# Patient Record
Sex: Female | Born: 1935 | Race: Black or African American | Hispanic: No | Smoking: Former smoker
Health system: Southern US, Community
[De-identification: ages and names within clinical notes are randomized; demographics above are authoritative.]

## PROBLEM LIST (undated history)

## (undated) DIAGNOSIS — E079 Disorder of thyroid, unspecified: Secondary | ICD-10-CM

## (undated) DIAGNOSIS — I499 Cardiac arrhythmia, unspecified: Secondary | ICD-10-CM

## (undated) DIAGNOSIS — I10 Essential (primary) hypertension: Secondary | ICD-10-CM

## (undated) DIAGNOSIS — K08109 Complete loss of teeth, unspecified cause, unspecified class: Secondary | ICD-10-CM

## (undated) DIAGNOSIS — E785 Hyperlipidemia, unspecified: Secondary | ICD-10-CM

## (undated) DIAGNOSIS — T4145XA Adverse effect of unspecified anesthetic, initial encounter: Secondary | ICD-10-CM

## (undated) DIAGNOSIS — IMO0001 Reserved for inherently not codable concepts without codable children: Secondary | ICD-10-CM

## (undated) DIAGNOSIS — M810 Age-related osteoporosis without current pathological fracture: Secondary | ICD-10-CM

## (undated) DIAGNOSIS — D649 Anemia, unspecified: Secondary | ICD-10-CM

## (undated) DIAGNOSIS — E119 Type 2 diabetes mellitus without complications: Secondary | ICD-10-CM

## (undated) DIAGNOSIS — Z972 Presence of dental prosthetic device (complete) (partial): Secondary | ICD-10-CM

## (undated) DIAGNOSIS — Z87891 Personal history of nicotine dependence: Secondary | ICD-10-CM

## (undated) DIAGNOSIS — IMO0002 Reserved for concepts with insufficient information to code with codable children: Secondary | ICD-10-CM

## (undated) DIAGNOSIS — E042 Nontoxic multinodular goiter: Secondary | ICD-10-CM

## (undated) DIAGNOSIS — I4891 Unspecified atrial fibrillation: Secondary | ICD-10-CM

## (undated) DIAGNOSIS — C801 Malignant (primary) neoplasm, unspecified: Secondary | ICD-10-CM

## (undated) DIAGNOSIS — Z973 Presence of spectacles and contact lenses: Secondary | ICD-10-CM

## (undated) DIAGNOSIS — C50919 Malignant neoplasm of unspecified site of unspecified female breast: Secondary | ICD-10-CM

## (undated) DIAGNOSIS — T8859XA Other complications of anesthesia, initial encounter: Secondary | ICD-10-CM

## (undated) HISTORY — DX: Essential (primary) hypertension: I10

## (undated) HISTORY — PX: OTHER SURGICAL HISTORY: SHX169

## (undated) HISTORY — DX: Disorder of thyroid, unspecified: E07.9

## (undated) HISTORY — DX: Anemia, unspecified: D64.9

## (undated) HISTORY — DX: Type 2 diabetes mellitus without complications: E11.9

## (undated) HISTORY — DX: Reserved for concepts with insufficient information to code with codable children: IMO0002

## (undated) HISTORY — DX: Reserved for inherently not codable concepts without codable children: IMO0001

## (undated) HISTORY — DX: Age-related osteoporosis without current pathological fracture: M81.0

## (undated) HISTORY — DX: Nontoxic multinodular goiter: E04.2

## (undated) HISTORY — PX: ABDOMINAL HYSTERECTOMY: SHX81

## (undated) HISTORY — DX: Hyperlipidemia, unspecified: E78.5

## (undated) HISTORY — DX: Personal history of nicotine dependence: Z87.891

---

## 1998-09-11 ENCOUNTER — Inpatient Hospital Stay (HOSPITAL_COMMUNITY): Admission: RE | Admit: 1998-09-11 | Discharge: 1998-09-16 | Payer: Self-pay | Admitting: Orthopedic Surgery

## 2000-01-01 ENCOUNTER — Encounter: Payer: Self-pay | Admitting: Orthopedic Surgery

## 2000-01-05 ENCOUNTER — Inpatient Hospital Stay (HOSPITAL_COMMUNITY): Admission: RE | Admit: 2000-01-05 | Discharge: 2000-01-09 | Payer: Self-pay | Admitting: Orthopedic Surgery

## 2000-03-16 HISTORY — PX: TOTAL KNEE ARTHROPLASTY: SHX125

## 2006-12-02 ENCOUNTER — Ambulatory Visit (HOSPITAL_COMMUNITY): Admission: RE | Admit: 2006-12-02 | Discharge: 2006-12-02 | Payer: Self-pay | Admitting: Ophthalmology

## 2006-12-23 ENCOUNTER — Ambulatory Visit (HOSPITAL_COMMUNITY): Admission: RE | Admit: 2006-12-23 | Discharge: 2006-12-23 | Payer: Self-pay | Admitting: Ophthalmology

## 2009-03-12 ENCOUNTER — Emergency Department (HOSPITAL_COMMUNITY): Admission: EM | Admit: 2009-03-12 | Discharge: 2009-03-12 | Payer: Self-pay | Admitting: Emergency Medicine

## 2009-05-02 ENCOUNTER — Inpatient Hospital Stay (HOSPITAL_COMMUNITY): Admission: EM | Admit: 2009-05-02 | Discharge: 2009-05-13 | Payer: Self-pay | Admitting: Emergency Medicine

## 2009-12-22 HISTORY — PX: EYE SURGERY: SHX253

## 2010-06-04 LAB — URINE MICROSCOPIC-ADD ON

## 2010-06-04 LAB — URINALYSIS, MICROSCOPIC ONLY
Bilirubin Urine: NEGATIVE
Glucose, UA: NEGATIVE mg/dL
Ketones, ur: NEGATIVE mg/dL
Leukocytes, UA: NEGATIVE
Nitrite: NEGATIVE
Protein, ur: NEGATIVE mg/dL
Specific Gravity, Urine: 1.011 (ref 1.005–1.030)
Urobilinogen, UA: 1 mg/dL (ref 0.0–1.0)
pH: 5.5 (ref 5.0–8.0)

## 2010-06-04 LAB — CROSSMATCH
ABO/RH(D): O POS
Antibody Screen: NEGATIVE

## 2010-06-04 LAB — BASIC METABOLIC PANEL
BUN: 10 mg/dL (ref 6–23)
BUN: 10 mg/dL (ref 6–23)
BUN: 11 mg/dL (ref 6–23)
BUN: 12 mg/dL (ref 6–23)
BUN: 13 mg/dL (ref 6–23)
BUN: 13 mg/dL (ref 6–23)
BUN: 23 mg/dL (ref 6–23)
BUN: 8 mg/dL (ref 6–23)
CO2: 26 mEq/L (ref 19–32)
CO2: 26 mEq/L (ref 19–32)
CO2: 26 mEq/L (ref 19–32)
CO2: 28 mEq/L (ref 19–32)
CO2: 28 mEq/L (ref 19–32)
CO2: 28 mEq/L (ref 19–32)
CO2: 30 mEq/L (ref 19–32)
CO2: 31 mEq/L (ref 19–32)
Calcium: 8.8 mg/dL (ref 8.4–10.5)
Calcium: 9 mg/dL (ref 8.4–10.5)
Calcium: 9.1 mg/dL (ref 8.4–10.5)
Calcium: 9.2 mg/dL (ref 8.4–10.5)
Calcium: 9.2 mg/dL (ref 8.4–10.5)
Calcium: 9.3 mg/dL (ref 8.4–10.5)
Calcium: 9.5 mg/dL (ref 8.4–10.5)
Calcium: 9.5 mg/dL (ref 8.4–10.5)
Chloride: 100 mEq/L (ref 96–112)
Chloride: 102 mEq/L (ref 96–112)
Chloride: 102 mEq/L (ref 96–112)
Chloride: 103 mEq/L (ref 96–112)
Chloride: 103 mEq/L (ref 96–112)
Chloride: 104 mEq/L (ref 96–112)
Chloride: 104 mEq/L (ref 96–112)
Chloride: 107 mEq/L (ref 96–112)
Creatinine, Ser: 0.47 mg/dL (ref 0.4–1.2)
Creatinine, Ser: 0.51 mg/dL (ref 0.4–1.2)
Creatinine, Ser: 0.51 mg/dL (ref 0.4–1.2)
Creatinine, Ser: 0.53 mg/dL (ref 0.4–1.2)
Creatinine, Ser: 0.53 mg/dL (ref 0.4–1.2)
Creatinine, Ser: 0.54 mg/dL (ref 0.4–1.2)
Creatinine, Ser: 0.64 mg/dL (ref 0.4–1.2)
Creatinine, Ser: 0.75 mg/dL (ref 0.4–1.2)
GFR calc Af Amer: >60 mL/min (ref >60)
GFR calc Af Amer: >60 mL/min (ref >60)
GFR calc Af Amer: >60 mL/min (ref >60)
GFR calc Af Amer: >60 mL/min (ref >60)
GFR calc Af Amer: >60 mL/min (ref >60)
GFR calc Af Amer: >60 mL/min (ref >60)
GFR calc Af Amer: >60 mL/min (ref >60)
GFR calc Af Amer: >60 mL/min (ref >60)
GFR calc non Af Amer: >60 mL/min (ref >60)
GFR calc non Af Amer: >60 mL/min (ref >60)
GFR calc non Af Amer: >60 mL/min (ref >60)
GFR calc non Af Amer: >60 mL/min (ref >60)
GFR calc non Af Amer: >60 mL/min (ref >60)
GFR calc non Af Amer: >60 mL/min (ref >60)
GFR calc non Af Amer: >60 mL/min (ref >60)
GFR calc non Af Amer: >60 mL/min (ref >60)
Glucose, Bld: 151 mg/dL — ABNORMAL HIGH (ref 70–99)
Glucose, Bld: 167 mg/dL — ABNORMAL HIGH (ref 70–99)
Glucose, Bld: 179 mg/dL — ABNORMAL HIGH (ref 70–99)
Glucose, Bld: 179 mg/dL — ABNORMAL HIGH (ref 70–99)
Glucose, Bld: 181 mg/dL — ABNORMAL HIGH (ref 70–99)
Glucose, Bld: 183 mg/dL — ABNORMAL HIGH (ref 70–99)
Glucose, Bld: 193 mg/dL — ABNORMAL HIGH (ref 70–99)
Glucose, Bld: 210 mg/dL — ABNORMAL HIGH (ref 70–99)
Potassium: 2.9 mEq/L — ABNORMAL LOW (ref 3.5–5.1)
Potassium: 3.1 mEq/L — ABNORMAL LOW (ref 3.5–5.1)
Potassium: 3.1 mEq/L — ABNORMAL LOW (ref 3.5–5.1)
Potassium: 3.3 mEq/L — ABNORMAL LOW (ref 3.5–5.1)
Potassium: 3.4 mEq/L — ABNORMAL LOW (ref 3.5–5.1)
Potassium: 3.4 mEq/L — ABNORMAL LOW (ref 3.5–5.1)
Potassium: 3.7 mEq/L (ref 3.5–5.1)
Potassium: 3.8 mEq/L (ref 3.5–5.1)
Sodium: 135 mEq/L (ref 135–145)
Sodium: 136 mEq/L (ref 135–145)
Sodium: 136 mEq/L (ref 135–145)
Sodium: 136 mEq/L (ref 135–145)
Sodium: 137 mEq/L (ref 135–145)
Sodium: 138 mEq/L (ref 135–145)
Sodium: 140 mEq/L (ref 135–145)
Sodium: 141 mEq/L (ref 135–145)

## 2010-06-04 LAB — GLUCOSE, CAPILLARY
Glucose-Capillary: 133 mg/dL — ABNORMAL HIGH (ref 70–99)
Glucose-Capillary: 134 mg/dL — ABNORMAL HIGH (ref 70–99)
Glucose-Capillary: 137 mg/dL — ABNORMAL HIGH (ref 70–99)
Glucose-Capillary: 138 mg/dL — ABNORMAL HIGH (ref 70–99)
Glucose-Capillary: 138 mg/dL — ABNORMAL HIGH (ref 70–99)
Glucose-Capillary: 139 mg/dL — ABNORMAL HIGH (ref 70–99)
Glucose-Capillary: 144 mg/dL — ABNORMAL HIGH (ref 70–99)
Glucose-Capillary: 145 mg/dL — ABNORMAL HIGH (ref 70–99)
Glucose-Capillary: 150 mg/dL — ABNORMAL HIGH (ref 70–99)
Glucose-Capillary: 150 mg/dL — ABNORMAL HIGH (ref 70–99)
Glucose-Capillary: 151 mg/dL — ABNORMAL HIGH (ref 70–99)
Glucose-Capillary: 152 mg/dL — ABNORMAL HIGH (ref 70–99)
Glucose-Capillary: 153 mg/dL — ABNORMAL HIGH (ref 70–99)
Glucose-Capillary: 153 mg/dL — ABNORMAL HIGH (ref 70–99)
Glucose-Capillary: 155 mg/dL — ABNORMAL HIGH (ref 70–99)
Glucose-Capillary: 155 mg/dL — ABNORMAL HIGH (ref 70–99)
Glucose-Capillary: 158 mg/dL — ABNORMAL HIGH (ref 70–99)
Glucose-Capillary: 158 mg/dL — ABNORMAL HIGH (ref 70–99)
Glucose-Capillary: 161 mg/dL — ABNORMAL HIGH (ref 70–99)
Glucose-Capillary: 161 mg/dL — ABNORMAL HIGH (ref 70–99)
Glucose-Capillary: 162 mg/dL — ABNORMAL HIGH (ref 70–99)
Glucose-Capillary: 164 mg/dL — ABNORMAL HIGH (ref 70–99)
Glucose-Capillary: 164 mg/dL — ABNORMAL HIGH (ref 70–99)
Glucose-Capillary: 164 mg/dL — ABNORMAL HIGH (ref 70–99)
Glucose-Capillary: 164 mg/dL — ABNORMAL HIGH (ref 70–99)
Glucose-Capillary: 166 mg/dL — ABNORMAL HIGH (ref 70–99)
Glucose-Capillary: 168 mg/dL — ABNORMAL HIGH (ref 70–99)
Glucose-Capillary: 173 mg/dL — ABNORMAL HIGH (ref 70–99)
Glucose-Capillary: 174 mg/dL — ABNORMAL HIGH (ref 70–99)
Glucose-Capillary: 178 mg/dL — ABNORMAL HIGH (ref 70–99)
Glucose-Capillary: 182 mg/dL — ABNORMAL HIGH (ref 70–99)
Glucose-Capillary: 183 mg/dL — ABNORMAL HIGH (ref 70–99)
Glucose-Capillary: 184 mg/dL — ABNORMAL HIGH (ref 70–99)
Glucose-Capillary: 184 mg/dL — ABNORMAL HIGH (ref 70–99)
Glucose-Capillary: 187 mg/dL — ABNORMAL HIGH (ref 70–99)
Glucose-Capillary: 188 mg/dL — ABNORMAL HIGH (ref 70–99)
Glucose-Capillary: 189 mg/dL — ABNORMAL HIGH (ref 70–99)
Glucose-Capillary: 194 mg/dL — ABNORMAL HIGH (ref 70–99)
Glucose-Capillary: 206 mg/dL — ABNORMAL HIGH (ref 70–99)
Glucose-Capillary: 207 mg/dL — ABNORMAL HIGH (ref 70–99)
Glucose-Capillary: 221 mg/dL — ABNORMAL HIGH (ref 70–99)
Glucose-Capillary: 243 mg/dL — ABNORMAL HIGH (ref 70–99)

## 2010-06-04 LAB — URINALYSIS, ROUTINE W REFLEX MICROSCOPIC
Bilirubin Urine: NEGATIVE
Bilirubin Urine: NEGATIVE
Bilirubin Urine: NEGATIVE
Glucose, UA: NEGATIVE mg/dL
Glucose, UA: NEGATIVE mg/dL
Glucose, UA: NEGATIVE mg/dL
Ketones, ur: NEGATIVE mg/dL
Ketones, ur: NEGATIVE mg/dL
Ketones, ur: NEGATIVE mg/dL
Leukocytes, UA: NEGATIVE
Leukocytes, UA: NEGATIVE
Nitrite: NEGATIVE
Nitrite: NEGATIVE
Nitrite: NEGATIVE
Protein, ur: 100 mg/dL — AB
Protein, ur: 30 mg/dL — AB
Protein, ur: NEGATIVE mg/dL
Specific Gravity, Urine: 1.008 (ref 1.005–1.030)
Specific Gravity, Urine: 1.022 (ref 1.005–1.030)
Specific Gravity, Urine: 1.023 (ref 1.005–1.030)
Urobilinogen, UA: 0.2 mg/dL (ref 0.0–1.0)
Urobilinogen, UA: 1 mg/dL (ref 0.0–1.0)
Urobilinogen, UA: 1 mg/dL (ref 0.0–1.0)
pH: 6 (ref 5.0–8.0)
pH: 6 (ref 5.0–8.0)
pH: 6.5 (ref 5.0–8.0)

## 2010-06-04 LAB — CBC
HCT: 21.9 % — ABNORMAL LOW (ref 36.0–46.0)
HCT: 22 % — ABNORMAL LOW (ref 36.0–46.0)
HCT: 23.7 % — ABNORMAL LOW (ref 36.0–46.0)
HCT: 28 % — ABNORMAL LOW (ref 36.0–46.0)
HCT: 28.5 % — ABNORMAL LOW (ref 36.0–46.0)
HCT: 32.3 % — ABNORMAL LOW (ref 36.0–46.0)
HCT: 32.7 % — ABNORMAL LOW (ref 36.0–46.0)
HCT: 32.9 % — ABNORMAL LOW (ref 36.0–46.0)
HCT: 33.2 % — ABNORMAL LOW (ref 36.0–46.0)
HCT: 34.7 % — ABNORMAL LOW (ref 36.0–46.0)
Hemoglobin: 11 g/dL — ABNORMAL LOW (ref 12.0–15.0)
Hemoglobin: 11 g/dL — ABNORMAL LOW (ref 12.0–15.0)
Hemoglobin: 11.1 g/dL — ABNORMAL LOW (ref 12.0–15.0)
Hemoglobin: 11.2 g/dL — ABNORMAL LOW (ref 12.0–15.0)
Hemoglobin: 11.8 g/dL — ABNORMAL LOW (ref 12.0–15.0)
Hemoglobin: 7.4 g/dL — ABNORMAL LOW (ref 12.0–15.0)
Hemoglobin: 7.5 g/dL — ABNORMAL LOW (ref 12.0–15.0)
Hemoglobin: 8 g/dL — ABNORMAL LOW (ref 12.0–15.0)
Hemoglobin: 9.6 g/dL — ABNORMAL LOW (ref 12.0–15.0)
Hemoglobin: 9.8 g/dL — ABNORMAL LOW (ref 12.0–15.0)
MCHC: 33.6 g/dL (ref 30.0–36.0)
MCHC: 33.7 g/dL (ref 30.0–36.0)
MCHC: 33.8 g/dL (ref 30.0–36.0)
MCHC: 33.8 g/dL (ref 30.0–36.0)
MCHC: 33.9 g/dL (ref 30.0–36.0)
MCHC: 34 g/dL (ref 30.0–36.0)
MCHC: 34.1 g/dL (ref 30.0–36.0)
MCHC: 34.1 g/dL (ref 30.0–36.0)
MCHC: 34.3 g/dL (ref 30.0–36.0)
MCHC: 34.5 g/dL (ref 30.0–36.0)
MCV: 86 fL (ref 78.0–100.0)
MCV: 86.1 fL (ref 78.0–100.0)
MCV: 86.4 fL (ref 78.0–100.0)
MCV: 86.8 fL (ref 78.0–100.0)
MCV: 87.3 fL (ref 78.0–100.0)
MCV: 88 fL (ref 78.0–100.0)
MCV: 88.5 fL (ref 78.0–100.0)
MCV: 88.9 fL (ref 78.0–100.0)
MCV: 89.2 fL (ref 78.0–100.0)
MCV: 89.3 fL (ref 78.0–100.0)
Platelets: 140 10*3/uL — ABNORMAL LOW (ref 150–400)
Platelets: 144 10*3/uL — ABNORMAL LOW (ref 150–400)
Platelets: 147 10*3/uL — ABNORMAL LOW (ref 150–400)
Platelets: 153 10*3/uL (ref 150–400)
Platelets: 170 10*3/uL (ref 150–400)
Platelets: 175 10*3/uL (ref 150–400)
Platelets: 197 10*3/uL (ref 150–400)
Platelets: 219 10*3/uL (ref 150–400)
Platelets: 224 10*3/uL (ref 150–400)
Platelets: 316 10*3/uL (ref 150–400)
RBC: 2.54 MIL/uL — ABNORMAL LOW (ref 3.87–5.11)
RBC: 2.56 MIL/uL — ABNORMAL LOW (ref 3.87–5.11)
RBC: 2.75 MIL/uL — ABNORMAL LOW (ref 3.87–5.11)
RBC: 3.21 MIL/uL — ABNORMAL LOW (ref 3.87–5.11)
RBC: 3.24 MIL/uL — ABNORMAL LOW (ref 3.87–5.11)
RBC: 3.64 MIL/uL — ABNORMAL LOW (ref 3.87–5.11)
RBC: 3.68 MIL/uL — ABNORMAL LOW (ref 3.87–5.11)
RBC: 3.69 MIL/uL — ABNORMAL LOW (ref 3.87–5.11)
RBC: 3.82 MIL/uL — ABNORMAL LOW (ref 3.87–5.11)
RBC: 3.89 MIL/uL (ref 3.87–5.11)
RDW: 15.3 % (ref 11.5–15.5)
RDW: 15.8 % — ABNORMAL HIGH (ref 11.5–15.5)
RDW: 15.9 % — ABNORMAL HIGH (ref 11.5–15.5)
RDW: 16 % — ABNORMAL HIGH (ref 11.5–15.5)
RDW: 16.1 % — ABNORMAL HIGH (ref 11.5–15.5)
RDW: 16.2 % — ABNORMAL HIGH (ref 11.5–15.5)
RDW: 16.3 % — ABNORMAL HIGH (ref 11.5–15.5)
RDW: 16.3 % — ABNORMAL HIGH (ref 11.5–15.5)
RDW: 16.3 % — ABNORMAL HIGH (ref 11.5–15.5)
RDW: 16.4 % — ABNORMAL HIGH (ref 11.5–15.5)
WBC: 12.3 10*3/uL — ABNORMAL HIGH (ref 4.0–10.5)
WBC: 5.8 10*3/uL (ref 4.0–10.5)
WBC: 5.9 10*3/uL (ref 4.0–10.5)
WBC: 6 10*3/uL (ref 4.0–10.5)
WBC: 6.2 10*3/uL (ref 4.0–10.5)
WBC: 6.6 10*3/uL (ref 4.0–10.5)
WBC: 7.4 10*3/uL (ref 4.0–10.5)
WBC: 8.1 10*3/uL (ref 4.0–10.5)
WBC: 8.3 10*3/uL (ref 4.0–10.5)
WBC: 9.5 10*3/uL (ref 4.0–10.5)

## 2010-06-04 LAB — PROTIME-INR
INR: 0.98 (ref 0.00–1.49)
INR: 1.11 (ref 0.00–1.49)
INR: 1.14 (ref 0.00–1.49)
INR: 1.35 (ref 0.00–1.49)
INR: 2.03 — ABNORMAL HIGH (ref 0.00–1.49)
INR: 2.22 — ABNORMAL HIGH (ref 0.00–1.49)
INR: 2.23 — ABNORMAL HIGH (ref 0.00–1.49)
INR: 2.24 — ABNORMAL HIGH (ref 0.00–1.49)
Prothrombin Time: 12.9 seconds (ref 11.6–15.2)
Prothrombin Time: 14.2 seconds (ref 11.6–15.2)
Prothrombin Time: 14.5 seconds (ref 11.6–15.2)
Prothrombin Time: 16.6 seconds — ABNORMAL HIGH (ref 11.6–15.2)
Prothrombin Time: 22.8 seconds — ABNORMAL HIGH (ref 11.6–15.2)
Prothrombin Time: 24.4 seconds — ABNORMAL HIGH (ref 11.6–15.2)
Prothrombin Time: 24.5 seconds — ABNORMAL HIGH (ref 11.6–15.2)
Prothrombin Time: 24.6 seconds — ABNORMAL HIGH (ref 11.6–15.2)

## 2010-06-04 LAB — COMPREHENSIVE METABOLIC PANEL
ALT: 21 U/L (ref 0–35)
AST: 25 U/L (ref 0–37)
Albumin: 3.4 g/dL — ABNORMAL LOW (ref 3.5–5.2)
Alkaline Phosphatase: 67 U/L (ref 39–117)
BUN: 25 mg/dL — ABNORMAL HIGH (ref 6–23)
CO2: 24 mEq/L (ref 19–32)
Calcium: 9.8 mg/dL (ref 8.4–10.5)
Chloride: 105 mEq/L (ref 96–112)
Creatinine, Ser: 0.75 mg/dL (ref 0.4–1.2)
GFR calc Af Amer: >60 mL/min (ref >60)
GFR calc non Af Amer: >60 mL/min (ref >60)
Glucose, Bld: 146 mg/dL — ABNORMAL HIGH (ref 70–99)
Potassium: 3.8 mEq/L (ref 3.5–5.1)
Sodium: 140 mEq/L (ref 135–145)
Total Bilirubin: 0.5 mg/dL (ref 0.3–1.2)
Total Protein: 6.2 g/dL (ref 6.0–8.3)

## 2010-06-04 LAB — MRSA PCR SCREENING: MRSA by PCR: NEGATIVE

## 2010-06-04 LAB — POCT I-STAT, CHEM 8
BUN: 25 mg/dL — ABNORMAL HIGH (ref 6–23)
Calcium, Ion: 1.22 mmol/L (ref 1.12–1.32)
Chloride: 108 mEq/L (ref 96–112)
Creatinine, Ser: 0.7 mg/dL (ref 0.4–1.2)
Glucose, Bld: 148 mg/dL — ABNORMAL HIGH (ref 70–99)
HCT: 33 % — ABNORMAL LOW (ref 36.0–46.0)
Hemoglobin: 11.2 g/dL — ABNORMAL LOW (ref 12.0–15.0)
Potassium: 3.8 mEq/L (ref 3.5–5.1)
Sodium: 138 mEq/L (ref 135–145)
TCO2: 26 mmol/L (ref 0–100)

## 2010-06-04 LAB — HEMOGLOBIN AND HEMATOCRIT, BLOOD
HCT: 25.1 % — ABNORMAL LOW (ref 36.0–46.0)
Hemoglobin: 8.4 g/dL — ABNORMAL LOW (ref 12.0–15.0)

## 2010-06-04 LAB — URINE CULTURE
Colony Count: NO GROWTH
Culture: NO GROWTH

## 2010-06-04 LAB — APTT
aPTT: 26 seconds (ref 24–37)
aPTT: 31 seconds (ref 24–37)

## 2010-06-04 LAB — ABO/RH: ABO/RH(D): O POS

## 2010-06-04 LAB — LACTIC ACID, PLASMA: Lactic Acid, Venous: 3.3 mmol/L — ABNORMAL HIGH (ref 0.5–2.2)

## 2010-08-01 NOTE — Discharge Summary (Signed)
[place]  Patient:    Whitney [name], Whitney [name]                    MRN: [id number] Adm. Date:  [id number] Disc. Date: 01/09/00 Attending:  Carolan Shiver Ii Dictator:   Jamelle Rushing, P.A. CC:         Alfonse Alpers. Dagoberto Ligas, M.D.   Discharge Summary  DATE OF BIRTH:  10/24/1927  ADMISSION DIAGNOSES: 1. Failed left total knee arthroplasty. 2. Hypertension. 3. Diabetes type 2. 4. History of thyroid disease. 5. Hypercholesterolemia.  DISCHARGE DIAGNOSES: 1. Revision left total knee arthroplasty. 2. Hypertension. 3. Diabetes mellitus type 2. 4. History of thyroid disease. 5. Hypercholesterolemia.  SURGICAL PROCEDURES:  On January 05, 2000, the patient was taken to the OR by Jonny Ruiz L. Dorothyann Gibbs, M.D., assisted by Georgena Spurling, M.D., and Jamelle Rushing, P.A.  Under general anesthesia, the patient underwent a revision of the tibial component of the left total knee.  The patient had a new tibial tray and poly inserted into the knee.  The patient tolerated the procedure well. The approximate tourniquet time was 1 hours and 20 minutes.  The patient was transferred to the recovery room in good condition.  CONSULTS:  On January 05, 2000, the following routine consults were requested: Physical therapy, occupational therapy, rehabilitation, care management, and pharmacy for Coumadin dosing for DVT prophylaxis.  HOSPITAL COURSE:  On January 05, 2000, the patient was admitted to [facility]. [facility] under the care of John L. Dorothyann Gibbs, M.D.  The patient was taken to the OR for a revision of her left total knee arthroplasty and this was accomplished without any complications.  The patient then had a four-day postoperative course where she did have a period of postoperative blood loss anemia where on postoperative day #2 her hemoglobin was 8.9 and her hematocrit was 25.8.  She received two units of packed red blood cells with an improvement of  her hemoglobin to 9.1 and her hematocrit to 26.2.  The patient did have some calf pain on postoperative day #2, so a Doppler was obtained to rule out a DVT and this was in fact negative.  Otherwise the patient progressed very nicely with physical therapy and was able to be discharged on postoperative day #4 to home.  The patient did develop an erythematic rash about the left knee around the taping pattern of her knee with some soft skin blisters also developing.  They did improve slightly after discontinuing the taping and just fastening the bandage to the skin underneath the TED stockings.  The patient also had some cortisone 1% cream applied to effected areas to see if this would also improve some of the itching which she developed.  The patient was eager to go home on postoperative day #4 and there were no indications for keeping her in the hospital at this time, so she was in fact discharged to home.  LABORATORIES:  The CBC on January 08, 2000, showed WBC 8.4, hemoglobin 9.1, hematocrit 26.2, and platelets 211.  Coagulation times on January 09, 2000, were PT 17.1 with a 1.7 INR.  Routine chemistries on January 07, 2000, were sodium 133, glucose 179, and all other values normal.  MEDICATIONS ON DISCHARGE:  1. Colace 100 mg p.o. b.i.d.  2. Senokot one tablet p.o. b.i.d. before meals.  3. Corgard 30 mg p.o. q.d.  4. Glyburide 5 mg p.o. b.i.d. before meals.  5. Lisinopril 20 mg  p.o. q.d.  6. Triamterene/hydrochlorothiazide 25 mg p.o. q.d.  7. Zocor 20 mg p.o. q.d.  8. Norvasc 5 mg p.o. q.d.  9. Glucophage 1000 mg p.o. b.i.d. 10. OxyContin CR 10 mg p.o. q.12h. 11. Benadryl 25 mg p.o. q.6h. p.r.n. 12. Percocet one or two tablets p.o. q.4-6h. p.r.n. pain. 13. Coumadin 5 mg p.o. q.d. to be adjusted and maintain an INR of 2-2.5 by the     [place]. [facility] pharmacist.  CONDITION ON DISCHARGE TO HOME:  Listed as good. DD:  01/09/00 TD:  01/09/00 Job:  [id number]

## 2010-08-01 NOTE — Op Note (Signed)
[place]. [facility]  Patient:    Whitney [name], Whitney [name]                    MRN: [id number] Proc. Date: 01/05/00 Adm. Date:  [id number] Attending:  Carolan Shiver Ii                           Operative Report  PREOPERATIVE DIAGNOSIS:  Loose tibial component, left total knee.  POSTOPERATIVE DIAGNOSIS:  Loose tibial component, left total knee.  OPERATION PERFORMED:  Revision tibial component, left total knee.  SURGEON:  John L. Rendall III, M.D.  ASSISTANT: 1. Georgena Spurling, M.D. 2. Jamelle Rushing, P.A.  ANESTHESIA:  General.  PATHOLOGY:  The patient has had a total knee done a little over a year ago. She has had severe valgus prior to surgery and has developed a significant medial joint pain in her left total knee.  At the time of surgery, the tibial tray was found to be sunken in medially and the tray was grossly loose.  DESCRIPTION OF PROCEDURE:  Under general anesthesia, the left leg was prepared with Dura-Prep and draped as a sterile field.  A sterile thigh tourniquet was used.  The leg was wrapped out with the Esmarch and the tourniquet was used 350 mm.  The previous midline incision was reopened.  A deep medial parapatellar incision was made.  The patella was everted.  Thick brown synovitis was seen.  A synovectomy was carried out.  The periosteum and medial collateral ligament were peeled from the medial side of the tibia.  The tibial plastic tray was removed.  The tibial component was grossly loose and was removed.  A center hole was drilled.  Proximal tibial resection was carried out using an intramedullary guide.  Once this was done, care was taken to remove any excess glue from the center peg hole.  This was then sized to a standard.  A progressive hand reaming up to 18 mm revealed good fit for a 95 mm revision fluted stem and then this was tried in place.  Standard size revision tibial tray with 95 mm 18.5 fluted tibial stem.  Excellent  fit, alignment and stability were found.  An approximate 15 mm bearing was originally used but minor trimming of the tibial tray upped this to 17.5.  A permanent component was then cemented in place after preparing the proximal tibia with multiple drill holes and irrigating with pulse saline lavage and antibiotic solution.  The Depuy 2 bone cement was used.  The tibial tray component was cemented in place.  A temporary spacer was used.  Once cement hardened, all excess was removed.  A trialing of 17.5 and 20 mm bearings revealed the 20 mm rotating platform bearing was the best fit.  The tourniquet was then let down and the knee was closed in layers with #1 Tycron, 0 and 2-0 Vicryl and skin clips.  Tourniquet time was one hour.  Total operative time was approximately one hour and 20 minutes.  The patient tolerated the procedure well and returned to recovery in good condition.  There was excellent stability and alignment status post revision. DD:  01/05/00 TD:  01/05/00 Job: [id number]

## 2010-12-26 LAB — BASIC METABOLIC PANEL
BUN: 22
CO2: 32
Calcium: 10.4
Chloride: 104
Creatinine, Ser: 0.9
GFR calc Af Amer: >60
GFR calc non Af Amer: >60
Glucose, Bld: 91
Potassium: 3.8
Sodium: 143

## 2010-12-26 LAB — HEMOGLOBIN AND HEMATOCRIT, BLOOD
HCT: 34 — ABNORMAL LOW
Hemoglobin: 11.4 — ABNORMAL LOW

## 2012-10-06 ENCOUNTER — Other Ambulatory Visit: Payer: Self-pay | Admitting: *Deleted

## 2012-10-06 MED ORDER — POTASSIUM CHLORIDE CRYS ER 20 MEQ PO TBCR: 20.0000 meq | EXTENDED_RELEASE_TABLET | Freq: Every day | ORAL | Status: DC

## 2012-11-17 ENCOUNTER — Telehealth: Payer: Self-pay | Admitting: Endocrinology

## 2012-11-17 MED ORDER — PIOGLITAZONE HCL 30 MG PO TABS: 30.0000 mg | ORAL_TABLET | Freq: Every day | ORAL | Status: DC

## 2012-11-17 NOTE — Telephone Encounter (Signed)
rx sent

## 2012-11-21 ENCOUNTER — Other Ambulatory Visit: Payer: Self-pay | Admitting: *Deleted

## 2012-11-21 DIAGNOSIS — E039 Hypothyroidism, unspecified: Secondary | ICD-10-CM

## 2012-11-21 DIAGNOSIS — E119 Type 2 diabetes mellitus without complications: Secondary | ICD-10-CM

## 2012-11-21 DIAGNOSIS — E876 Hypokalemia: Secondary | ICD-10-CM

## 2012-11-24 ENCOUNTER — Other Ambulatory Visit: Payer: Self-pay | Admitting: Endocrinology

## 2012-11-24 ENCOUNTER — Other Ambulatory Visit (INDEPENDENT_AMBULATORY_CARE_PROVIDER_SITE_OTHER): Payer: Medicare Other

## 2012-11-24 ENCOUNTER — Ambulatory Visit: Payer: Medicare Other | Admitting: Endocrinology

## 2012-11-24 DIAGNOSIS — E039 Hypothyroidism, unspecified: Secondary | ICD-10-CM

## 2012-11-24 DIAGNOSIS — E119 Type 2 diabetes mellitus without complications: Secondary | ICD-10-CM

## 2012-11-24 LAB — COMPREHENSIVE METABOLIC PANEL
ALT: 11 U/L (ref 0–35)
AST: 13 U/L (ref 0–37)
Albumin: 3.8 g/dL (ref 3.5–5.2)
Alkaline Phosphatase: 43 U/L (ref 39–117)
BUN: 19 mg/dL (ref 6–23)
CO2: 31 mEq/L (ref 19–32)
Calcium: 10 mg/dL (ref 8.4–10.5)
Chloride: 103 mEq/L (ref 96–112)
Creatinine, Ser: 0.7 mg/dL (ref 0.4–1.2)
GFR: 102.83 mL/min (ref >60.00)
Glucose, Bld: 121 mg/dL — ABNORMAL HIGH (ref 70–99)
Potassium: 4.2 mEq/L (ref 3.5–5.1)
Sodium: 139 mEq/L (ref 135–145)
Total Bilirubin: 0.5 mg/dL (ref 0.3–1.2)
Total Protein: 6.6 g/dL (ref 6.0–8.3)

## 2012-11-24 LAB — MICROALBUMIN / CREATININE URINE RATIO
Creatinine,U: 73.3 mg/dL
Microalb Creat Ratio: 18.4 mg/g (ref 0.0–30.0)
Microalb, Ur: 13.5 mg/dL — ABNORMAL HIGH (ref 0.0–1.9)

## 2012-11-24 LAB — URINALYSIS, ROUTINE W REFLEX MICROSCOPIC
Bilirubin Urine: NEGATIVE
Hgb urine dipstick: NEGATIVE
Ketones, ur: NEGATIVE
Nitrite: NEGATIVE
RBC / HPF: NONE SEEN (ref 0–?)
Specific Gravity, Urine: 1.015 (ref 1.000–1.030)
Urine Glucose: NEGATIVE
Urobilinogen, UA: 0.2 (ref 0.0–1.0)
pH: 6.5 (ref 5.0–8.0)

## 2012-11-24 LAB — HEMOGLOBIN A1C: Hgb A1c MFr Bld: 6 % (ref 4.6–6.5)

## 2012-11-24 LAB — TSH: TSH: 0.67 u[IU]/mL (ref 0.35–5.50)

## 2012-11-24 LAB — T4, FREE: Free T4: 1.37 ng/dL (ref 0.60–1.60)

## 2012-12-01 ENCOUNTER — Ambulatory Visit (INDEPENDENT_AMBULATORY_CARE_PROVIDER_SITE_OTHER): Payer: Medicare Other | Admitting: Endocrinology

## 2012-12-01 ENCOUNTER — Other Ambulatory Visit: Payer: Self-pay | Admitting: *Deleted

## 2012-12-01 ENCOUNTER — Encounter: Payer: Self-pay | Admitting: Endocrinology

## 2012-12-01 VITALS — BP 120/72 | HR 75 | Temp 98.4°F | Resp 12 | Ht 67.0 in | Wt 201.4 lb

## 2012-12-01 DIAGNOSIS — R609 Edema, unspecified: Secondary | ICD-10-CM | POA: Insufficient documentation

## 2012-12-01 DIAGNOSIS — I10 Essential (primary) hypertension: Secondary | ICD-10-CM | POA: Insufficient documentation

## 2012-12-01 DIAGNOSIS — E559 Vitamin D deficiency, unspecified: Secondary | ICD-10-CM | POA: Insufficient documentation

## 2012-12-01 DIAGNOSIS — E042 Nontoxic multinodular goiter: Secondary | ICD-10-CM

## 2012-12-01 DIAGNOSIS — E7800 Pure hypercholesterolemia, unspecified: Secondary | ICD-10-CM

## 2012-12-01 DIAGNOSIS — E119 Type 2 diabetes mellitus without complications: Secondary | ICD-10-CM

## 2012-12-01 NOTE — Patient Instructions (Addendum)
Please check blood sugars at least half the time about 2 hours after any meal and as directed on waking up. Please bring blood sugar monitor to each visit  Do not take Lasix unless feet swelling Take Potassium only when lasix taken

## 2012-12-01 NOTE — Progress Notes (Signed)
Patient ID: Whitney [name], female   DOB: 03-13-1928, 79 y.o.   MRN: [id number]  Whitney [name] is an 79 y.o. female.   Reason for Appointment: Diabetes follow-up   History of Present Illness   Diagnosis: Type 2 DIABETES MELITUS, date of diagnosis:  1983     Previous history: She was previously taking metformin and Actos for several years. She generally has had excellent control  Recent history: Her metformin was reduced earlier this year because of her age but not clear if she is taking it now. However had gained weight when her metformin was reduced and still has not been able to lose any         Monitors blood glucose: Once a day.    Glucometer: One Touch.          Blood Glucose readings from meter download: readings before breakfast:  98-176 with only occasional high readings, overall median 135 Hypoglycemia frequency:  none        Meals: 3 meals per day.          Physical activity: exercise: Just walking around the house           Dietician visit: Most recent:?         Complications: are: None The last HbgA1c was reported as 6.0    Wt Readings from Last 3 Encounters:  12/01/12 201 lb 6.4 oz (91.354 kg)    LABS:  Lab Results  Component Value Date   HGBA1C 6.0 11/24/2012   Orders Only on 11/24/2012  Component Date Value Range Status  . TSH 11/24/2012 0.67  0.35 - 5.50 uIU/mL Final  Orders Only on 11/24/2012  Component Date Value Range Status  . Free T4 11/24/2012 1.37  0.60 - 1.60 ng/dL Final  Orders Only on 40/98/1191  Component Date Value Range Status  . Sodium 11/24/2012 139  135 - 145 mEq/L Final  . Potassium 11/24/2012 4.2  3.5 - 5.1 mEq/L Final  . Chloride 11/24/2012 103  96 - 112 mEq/L Final  . CO2 11/24/2012 31  19 - 32 mEq/L Final  . Glucose, Bld 11/24/2012 121* 70 - 99 mg/dL Final  . BUN 47/82/9562 19  6 - 23 mg/dL Final  . Creatinine, Ser 11/24/2012 0.7  0.4 - 1.2 mg/dL Final  . Total Bilirubin 11/24/2012 0.5  0.3 - 1.2 mg/dL Final  . Alkaline  Phosphatase 11/24/2012 43  39 - 117 U/L Final  . AST 11/24/2012 13  0 - 37 U/L Final  . ALT 11/24/2012 11  0 - 35 U/L Final  . Total Protein 11/24/2012 6.6  6.0 - 8.3 g/dL Final  . Albumin 13/10/6576 3.8  3.5 - 5.2 g/dL Final  . Calcium 46/96/2952 10.0  8.4 - 10.5 mg/dL Final  . GFR 84/13/2440 102.83  >60.00 mL/min Final  Orders Only on 11/24/2012  Component Date Value Range Status  . Hemoglobin A1C 11/24/2012 6.0  4.6 - 6.5 % Final   Glycemic Control Guidelines for People with Diabetes:Non Diabetic:  <6%Goal of Therapy: <7%Additional Action Suggested:  >8%   Appointment on 11/24/2012  Component Date Value Range Status  . Microalb, Ur 11/24/2012 13.5* 0.0 - 1.9 mg/dL Final  . Creatinine,U 01/10/2535 73.3   Final  . Microalb Creat Ratio 11/24/2012 18.4  0.0 - 30.0 mg/g Final  . Color, Urine 11/24/2012 LT. YELLOW  Yellow;Lt. Yellow Final  . APPearance 11/24/2012 CLEAR  Clear Final  . Specific Gravity, Urine 11/24/2012 1.015  1.000-1.030 Final  .  pH 11/24/2012 6.5  5.0 - 8.0 Final  . Total Protein, Urine 11/24/2012 TRACE  Negative Final  . Urine Glucose 11/24/2012 NEGATIVE  Negative Final  . Ketones, ur 11/24/2012 NEGATIVE  Negative Final  . Bilirubin Urine 11/24/2012 NEGATIVE  Negative Final  . Hgb urine dipstick 11/24/2012 NEGATIVE  Negative Final  . Urobilinogen, UA 11/24/2012 0.2  0.0 - 1.0 Final  . Leukocytes, UA 11/24/2012 SMALL  Negative Final  . Nitrite 11/24/2012 NEGATIVE  Negative Final  . WBC, UA 11/24/2012 7-10/hpf  0-2/hpf Final  . RBC / HPF 11/24/2012 none seen  0-2/hpf Final  . Squamous Epithelial / LPF 11/24/2012 Rare(0-4/hpf)  Rare(0-4/hpf) Final       Medication List       This list is accurate as of: 12/01/12 11:59 PM.  Always use your most recent med list.               AMLODIPINE BESYLATE PO  Take 5 mg by mouth.     aspirin 81 MG tablet  Take 81 mg by mouth daily.     atorvastatin 10 MG tablet  Commonly known as:  LIPITOR  Take 10 mg by mouth  daily.     furosemide 40 MG tablet  Commonly known as:  LASIX  Take 40 mg by mouth.     glucose blood test strip  1 each by Other route as needed for other (One Touch Ultra). Use as instructed     levothyroxine 50 MCG tablet  Commonly known as:  SYNTHROID, LEVOTHROID  Take 50 mcg by mouth daily before breakfast.     lisinopril-hydrochlorothiazide 20-12.5 MG per tablet  Commonly known as:  PRINZIDE,ZESTORETIC  Take 1 tablet by mouth daily.     metoprolol succinate 25 MG 24 hr tablet  Commonly known as:  TOPROL-XL  Take 25 mg by mouth daily.     PA VITAMIN D-3 2000 UNITS Caps  Generic drug:  Cholecalciferol  Take 2,000 Units by mouth.     pioglitazone 30 MG tablet  Commonly known as:  ACTOS  Take 1 tablet (30 mg total) by mouth daily.     potassium chloride SA 20 MEQ tablet  Commonly known as:  K-DUR,KLOR-CON  Take 1 tablet (20 mEq total) by mouth daily.     vitamin B-12 1000 MCG tablet  Commonly known as:  CYANOCOBALAMIN  Take 1,000 mcg by mouth daily.        Allergies: No Known Allergies  No past medical history on file.  No past surgical history on file.  No family history on file.  Social History:  reports that she has quit smoking. She has never used smokeless tobacco. Her alcohol and drug histories are not on file.  Review of Systems:  Hypertension: She has had good control and her amlodipine was reduced in June because of low normal readings  Lipids: Tolerating Lipitor     She is taking Lasix for previous history of edema and is taking this every other day. Does not think she has edema on the days she does not take this  She has had vitamin D deficiency, her level was 35 after starting 2000 units supplements  No history of numbness or feeling in her feet  HYPOTHYROIDISM: She has had a multinodular goiter and not clear if she is truly hypothyroid. Over the last couple of years her dose has been tapered down because of low normal TSH levels and has not  had any increase in the size of  her goiter. TSH is now 0.67 compared to 0.59 on the last visit. She is compliant with her thyroid medication in the morning before breakfast. No fatigue   Examination:   BP 120/72  Pulse 75  Temp(Src) 98.4 F (36.9 C)  Resp 12  Ht 5\' 7"  (1.702 m)  Wt 201 lb 6.4 oz (91.354 kg)  BMI 31.54 kg/m2  SpO2 97%  Body mass index is 31.54 kg/(m^2).   Thyroid enlarged about 2-1/2 times normal the right, nodular and firm. 2 cm nodules felt on the left  No ankle edema  ASSESSMENT/ PLAN::   Diabetes type 2   Blood glucose control is still fairly good with Actos and metformin She is however having difficulty losing weight and discussed increasing activity  EDEMA: Not clear if she needs Lasix, she can try taking it as needed only. To take potassium only when she takes Lasix  Hypertension: Well controlled  Multinodular goiter: Still on low doses of thyroxine suppression, will continue  Eryc Bodey 12/02/2012, 1:23 PM

## 2012-12-02 ENCOUNTER — Other Ambulatory Visit: Payer: Self-pay | Admitting: *Deleted

## 2012-12-02 MED ORDER — LISINOPRIL-HYDROCHLOROTHIAZIDE 20-12.5 MG PO TABS: 1.0000 | ORAL_TABLET | Freq: Every day | ORAL | Status: DC

## 2013-01-20 ENCOUNTER — Other Ambulatory Visit: Payer: Self-pay | Admitting: *Deleted

## 2013-01-20 MED ORDER — AMLODIPINE BESYLATE 5 MG PO TABS: 5.0000 mg | ORAL_TABLET | Freq: Every day | ORAL | Status: DC

## 2013-01-20 MED ORDER — ATORVASTATIN CALCIUM 10 MG PO TABS: 10.0000 mg | ORAL_TABLET | Freq: Every day | ORAL | Status: DC

## 2013-02-21 ENCOUNTER — Other Ambulatory Visit: Payer: Self-pay | Admitting: *Deleted

## 2013-02-21 MED ORDER — METOPROLOL SUCCINATE ER 25 MG PO TB24: 25.0000 mg | ORAL_TABLET | Freq: Every day | ORAL | Status: DC

## 2013-02-21 MED ORDER — POTASSIUM CHLORIDE CRYS ER 20 MEQ PO TBCR: 20.0000 meq | EXTENDED_RELEASE_TABLET | Freq: Every day | ORAL | Status: DC

## 2013-02-21 MED ORDER — ATORVASTATIN CALCIUM 10 MG PO TABS: 10.0000 mg | ORAL_TABLET | Freq: Every day | ORAL | Status: DC

## 2013-02-21 MED ORDER — PIOGLITAZONE HCL 30 MG PO TABS: 30.0000 mg | ORAL_TABLET | Freq: Every day | ORAL | Status: DC

## 2013-02-21 MED ORDER — LISINOPRIL-HYDROCHLOROTHIAZIDE 20-12.5 MG PO TABS: 1.0000 | ORAL_TABLET | Freq: Every day | ORAL | Status: DC

## 2013-02-21 MED ORDER — AMLODIPINE BESYLATE 5 MG PO TABS: 5.0000 mg | ORAL_TABLET | Freq: Every day | ORAL | Status: DC

## 2013-02-24 ENCOUNTER — Other Ambulatory Visit: Payer: Self-pay | Admitting: Endocrinology

## 2013-03-15 ENCOUNTER — Other Ambulatory Visit: Payer: Self-pay | Admitting: *Deleted

## 2013-03-15 MED ORDER — FUROSEMIDE 40 MG PO TABS: ORAL_TABLET | ORAL | Status: DC

## 2013-03-23 ENCOUNTER — Other Ambulatory Visit: Payer: Self-pay | Admitting: *Deleted

## 2013-03-23 MED ORDER — LEVOTHYROXINE SODIUM 50 MCG PO TABS: 50.0000 ug | ORAL_TABLET | Freq: Every day | ORAL | Status: DC

## 2013-04-07 ENCOUNTER — Encounter: Payer: Self-pay | Admitting: Endocrinology

## 2013-04-07 ENCOUNTER — Other Ambulatory Visit (INDEPENDENT_AMBULATORY_CARE_PROVIDER_SITE_OTHER): Payer: Medicare Other

## 2013-04-07 ENCOUNTER — Ambulatory Visit (INDEPENDENT_AMBULATORY_CARE_PROVIDER_SITE_OTHER): Payer: Medicare Other | Admitting: Endocrinology

## 2013-04-07 VITALS — BP 128/70 | HR 82 | Temp 98.4°F | Resp 14 | Ht 67.0 in | Wt 206.3 lb

## 2013-04-07 DIAGNOSIS — E7800 Pure hypercholesterolemia, unspecified: Secondary | ICD-10-CM

## 2013-04-07 DIAGNOSIS — E119 Type 2 diabetes mellitus without complications: Secondary | ICD-10-CM

## 2013-04-07 DIAGNOSIS — E042 Nontoxic multinodular goiter: Secondary | ICD-10-CM

## 2013-04-07 DIAGNOSIS — E876 Hypokalemia: Secondary | ICD-10-CM

## 2013-04-07 DIAGNOSIS — E559 Vitamin D deficiency, unspecified: Secondary | ICD-10-CM

## 2013-04-07 DIAGNOSIS — I10 Essential (primary) hypertension: Secondary | ICD-10-CM

## 2013-04-07 DIAGNOSIS — R609 Edema, unspecified: Secondary | ICD-10-CM

## 2013-04-07 LAB — COMPREHENSIVE METABOLIC PANEL
ALT: 10 U/L (ref 0–35)
AST: 14 U/L (ref 0–37)
Albumin: 3.8 g/dL (ref 3.5–5.2)
Alkaline Phosphatase: 51 U/L (ref 39–117)
BUN: 21 mg/dL (ref 6–23)
CO2: 30 mEq/L (ref 19–32)
Calcium: 9.9 mg/dL (ref 8.4–10.5)
Chloride: 101 mEq/L (ref 96–112)
Creatinine, Ser: 0.8 mg/dL (ref 0.4–1.2)
GFR: 84.41 mL/min (ref >60.00)
Glucose, Bld: 109 mg/dL — ABNORMAL HIGH (ref 70–99)
Potassium: 3.4 mEq/L — ABNORMAL LOW (ref 3.5–5.1)
Sodium: 139 mEq/L (ref 135–145)
Total Bilirubin: 0.5 mg/dL (ref 0.3–1.2)
Total Protein: 6.9 g/dL (ref 6.0–8.3)

## 2013-04-07 LAB — LIPID PANEL
Cholesterol: 156 mg/dL (ref 0–200)
HDL: 78.8 mg/dL (ref >39.00)
LDL Cholesterol: 66 mg/dL (ref 0–99)
Total CHOL/HDL Ratio: 2
Triglycerides: 54 mg/dL (ref 0.0–149.0)
VLDL: 10.8 mg/dL (ref 0.0–40.0)

## 2013-04-07 LAB — HEMOGLOBIN A1C: Hgb A1c MFr Bld: 6 % (ref 4.6–6.5)

## 2013-04-07 NOTE — Patient Instructions (Signed)
Please check blood sugars at least half the time about 2 hours after any meal and as directed on waking up. Please bring blood sugar monitor to each visit

## 2013-04-07 NOTE — Progress Notes (Signed)
Patient ID: Whitney [name], female   DOB: 10/03/1927, 80 y.o.   MRN: [id number]   Reason for Appointment: Diabetes follow-up   History of Present Illness   Diagnosis: Type 2 DIABETES MELITUS, date of diagnosis:  1983     Previous history: She has been taking metformin and Actos for several years. She usually has had excellent control with upper normal A1c  Recent history: Her metformin was reduced from her dose of 2000 mg a day to 1500 mg because of her age. Is tolerating this and also Actos without side effects. Her blood sugars are somewhat variable because of her inconsistent diet  especially last month and also she has gained 5 pounds. Not very active at this time       Monitors blood glucose: Once a day.    Glucometer: One Touch.          Blood Glucose readings from meter download: readings before breakfast: 100-223 with only occasional high readings, overall median 147 Hypoglycemia frequency:  none        Meals: 3 meals per day.          Physical activity: exercise: Only walking around the house           Dietician visit: Most recent:?         Complications: are: None   Wt Readings from Last 3 Encounters:  04/07/13 206 lb 4.8 oz (93.577 kg)  12/01/12 201 lb 6.4 oz (91.354 kg)   Eye exams: last in 4/14  LABS:  Lab Results  Component Value Date   HGBA1C 6.0 04/07/2013   HGBA1C 6.0 11/24/2012   Lab Results  Component Value Date   MICROALBUR 13.5* 11/24/2012   LDLCALC 66 04/07/2013   CREATININE 0.8 04/07/2013         Medication List       This list is accurate as of: 04/07/13 10:43 AM.  Always use your most recent med list.               amLODipine 5 MG tablet  Commonly known as:  NORVASC  Take 1 tablet (5 mg total) by mouth daily.     aspirin 81 MG tablet  Take 81 mg by mouth daily.     atorvastatin 10 MG tablet  Commonly known as:  LIPITOR  Take 1 tablet (10 mg total) by mouth daily.     [facility] VITAMIN D 2000 UNITS Caps  Generic drug:  Cholecalciferol   TAKE 1 CAPSULE BY MOUTH DAILY FOR 90 DAYS     furosemide 40 MG tablet  Commonly known as:  LASIX  Take 1-2 tablets daily as needed     glucose blood test strip  1 each by Other route as needed for other (One Touch Ultra). Use as instructed     levothyroxine 50 MCG tablet  Commonly known as:  SYNTHROID, LEVOTHROID  Take 1 tablet (50 mcg total) by mouth daily before breakfast.     lisinopril-hydrochlorothiazide 20-12.5 MG per tablet  Commonly known as:  PRINZIDE,ZESTORETIC  Take 1 tablet by mouth daily.     metFORMIN 500 MG 24 hr tablet  Commonly known as:  GLUCOPHAGE-XR  TAKE 1 TABLET BY MOUTH IN THE MORNING AND 1 AT NIGHT     metoprolol succinate 25 MG 24 hr tablet  Commonly known as:  TOPROL-XL  Take 1 tablet (25 mg total) by mouth daily.     pioglitazone 30 MG tablet  Commonly known as:  ACTOS  Take  1 tablet (30 mg total) by mouth daily.     potassium chloride SA 20 MEQ tablet  Commonly known as:  K-DUR,KLOR-CON  Take 1 tablet (20 mEq total) by mouth daily.     vitamin B-12 1000 MCG tablet  Commonly known as:  CYANOCOBALAMIN  Take 1,000 mcg by mouth daily.        Allergies: No Known Allergies  No past medical history on file.  No past surgical history on file.  No family history on file.  Social History:  reports that she has quit smoking. She has never used smokeless tobacco. Her alcohol and drug histories are not on file.  Review of Systems:  Hypertension: She has had good control and her amlodipine dose has been 5 mg since 6/14   Lipids: Has been on long-term Lipitor with good control    She was taking Lasix for previous history of edema and is taking this prn.  Does not think she has significant edema   She has had vitamin D deficiency, her level was 35 with 2000 units vitamin D 3  No history of numbness or feeling in her feet  HYPOTHYROIDISM: She has had a multinodular goiter and not clear if she is truly hypothyroid. Over the last couple of  years her dose has been tapered down because of low normal TSH levels and has not had any increase in the size of her goiter.  She is compliant with her thyroid medication in the morning before breakfast. No fatigue  Lab Results  Component Value Date   TSH 0.67 11/24/2012    Examination:   BP 128/70  Pulse 82  Temp(Src) 98.4 F (36.9 C)  Resp 14  Ht 5\' 7"  (1.702 m)  Wt 206 lb 4.8 oz (93.577 kg)  BMI 32.30 kg/m2  SpO2 94%  Body mass index is 32.3 kg/(m^2).   No ankle edema  ASSESSMENT/ PLAN:  Diabetes type 2   Blood glucose control is overall fairly good with Actos and metformin considering her age She has not been doing any readings after meals and this was discussed She is however gaining weight  Advised her to be consistent with diet and start walking at a mall or other and/or areas  EDEMA: Not present now; she can continue taking Lasix as needed only. To take potassium only when she takes Lasix  Hypertension: Well controlled  Multinodular goiter: On low doses of thyroxine suppression, will continue and monitor TSH  periodically  Jerriann Schrom 04/07/2013, 10:43 AM   LABS:  Potassium low, will need to take her supplement more consistently  Appointment on 04/07/2013  Component Date Value Range Status  . Cholesterol 04/07/2013 156  0 - 200 mg/dL Final   ATP III Classification       Desirable:  < 200 mg/dL               Borderline High:  200 - 239 mg/dL          High:  > = 240 mg/dL  . Triglycerides 04/07/2013 54.0  0.0 - 149.0 mg/dL Final   Normal:  <150 mg/dLBorderline High:  150 - 199 mg/dL  . HDL 04/07/2013 78.80  >39.00 mg/dL Final  . VLDL 04/07/2013 10.8  0.0 - 40.0 mg/dL Final  . LDL Cholesterol 04/07/2013 66  0 - 99 mg/dL Final  . Total CHOL/HDL Ratio 04/07/2013 2   Final                  Men  Women1/2 Average Risk     3.4          3.3Average Risk          5.0          4.42X Average Risk          9.6          7.13X Average Risk          15.0          11.0                       . Sodium 04/07/2013 139  135 - 145 mEq/L Final  . Potassium 04/07/2013 3.4* 3.5 - 5.1 mEq/L Final  . Chloride 04/07/2013 101  96 - 112 mEq/L Final  . CO2 04/07/2013 30  19 - 32 mEq/L Final  . Glucose, Bld 04/07/2013 109* 70 - 99 mg/dL Final  . BUN 04/07/2013 21  6 - 23 mg/dL Final  . Creatinine, Ser 04/07/2013 0.8  0.4 - 1.2 mg/dL Final  . Total Bilirubin 04/07/2013 0.5  0.3 - 1.2 mg/dL Final  . Alkaline Phosphatase 04/07/2013 51  39 - 117 U/L Final  . AST 04/07/2013 14  0 - 37 U/L Final  . ALT 04/07/2013 10  0 - 35 U/L Final  . Total Protein 04/07/2013 6.9  6.0 - 8.3 g/dL Final  . Albumin 04/07/2013 3.8  3.5 - 5.2 g/dL Final  . Calcium 04/07/2013 9.9  8.4 - 10.5 mg/dL Final  . GFR 04/07/2013 84.41  >60.00 mL/min Final  . Hemoglobin A1C 04/07/2013 6.0  4.6 - 6.5 % Final   Glycemic Control Guidelines for People with Diabetes:Non Diabetic:  <6%Goal of Therapy: <7%Additional Action Suggested:  >8%

## 2013-04-18 ENCOUNTER — Other Ambulatory Visit: Payer: Self-pay | Admitting: *Deleted

## 2013-05-01 ENCOUNTER — Other Ambulatory Visit: Payer: Self-pay | Admitting: *Deleted

## 2013-06-07 ENCOUNTER — Encounter: Payer: Self-pay | Admitting: Family Medicine

## 2013-06-23 ENCOUNTER — Ambulatory Visit: Payer: Medicare Other | Admitting: Family Medicine

## 2013-07-27 ENCOUNTER — Ambulatory Visit (INDEPENDENT_AMBULATORY_CARE_PROVIDER_SITE_OTHER): Payer: Medicare Other | Admitting: Family Medicine

## 2013-07-27 ENCOUNTER — Encounter: Payer: Self-pay | Admitting: Family Medicine

## 2013-07-27 ENCOUNTER — Encounter: Payer: Self-pay | Admitting: *Deleted

## 2013-07-27 VITALS — BP 128/70 | HR 76 | Temp 98.5°F | Resp 16 | Ht 66.0 in | Wt 205.0 lb

## 2013-07-27 DIAGNOSIS — E039 Hypothyroidism, unspecified: Secondary | ICD-10-CM

## 2013-07-27 DIAGNOSIS — Z0000 Encounter for general adult medical examination without abnormal findings: Secondary | ICD-10-CM

## 2013-07-27 DIAGNOSIS — I10 Essential (primary) hypertension: Secondary | ICD-10-CM

## 2013-07-27 DIAGNOSIS — E785 Hyperlipidemia, unspecified: Secondary | ICD-10-CM

## 2013-07-27 DIAGNOSIS — Z87891 Personal history of nicotine dependence: Secondary | ICD-10-CM | POA: Insufficient documentation

## 2013-07-27 DIAGNOSIS — E119 Type 2 diabetes mellitus without complications: Secondary | ICD-10-CM

## 2013-07-27 LAB — COMPLETE METABOLIC PANEL WITH GFR
ALT: 8 U/L (ref 0–35)
AST: 14 U/L (ref 0–37)
Albumin: 4.1 g/dL (ref 3.5–5.2)
Alkaline Phosphatase: 56 U/L (ref 39–117)
BUN: 19 mg/dL (ref 6–23)
CO2: 29 mEq/L (ref 19–32)
Calcium: 10.1 mg/dL (ref 8.4–10.5)
Chloride: 102 mEq/L (ref 96–112)
Creat: 0.88 mg/dL (ref 0.50–1.10)
GFR, Est African American: 70 mL/min
GFR, Est Non African American: 61 mL/min
Glucose, Bld: 99 mg/dL (ref 70–99)
Potassium: 4.6 mEq/L (ref 3.5–5.3)
Sodium: 142 mEq/L (ref 135–145)
Total Bilirubin: 0.5 mg/dL (ref 0.2–1.2)
Total Protein: 6.5 g/dL (ref 6.0–8.3)

## 2013-07-27 LAB — HEMOGLOBIN A1C
Hgb A1c MFr Bld: 6.1 % — ABNORMAL HIGH (ref <5.7)
Mean Plasma Glucose: 128 mg/dL — ABNORMAL HIGH (ref <117)

## 2013-07-27 LAB — CBC WITH DIFFERENTIAL/PLATELET
Basophils Absolute: 0 10*3/uL (ref 0.0–0.1)
Basophils Relative: 0 % (ref 0–1)
Eosinophils Absolute: 0.1 10*3/uL (ref 0.0–0.7)
Eosinophils Relative: 2 % (ref 0–5)
HCT: 34.5 % — ABNORMAL LOW (ref 36.0–46.0)
Hemoglobin: 11.3 g/dL — ABNORMAL LOW (ref 12.0–15.0)
Lymphocytes Relative: 18 % (ref 12–46)
Lymphs Abs: 1 10*3/uL (ref 0.7–4.0)
MCH: 27.6 pg (ref 26.0–34.0)
MCHC: 32.8 g/dL (ref 30.0–36.0)
MCV: 84.4 fL (ref 78.0–100.0)
Monocytes Absolute: 0.5 10*3/uL (ref 0.1–1.0)
Monocytes Relative: 9 % (ref 3–12)
Neutro Abs: 4 10*3/uL (ref 1.7–7.7)
Neutrophils Relative %: 71 % (ref 43–77)
Platelets: 266 10*3/uL (ref 150–400)
RBC: 4.09 MIL/uL (ref 3.87–5.11)
RDW: 15.9 % — ABNORMAL HIGH (ref 11.5–15.5)
WBC: 5.7 10*3/uL (ref 4.0–10.5)

## 2013-07-27 LAB — MICROALBUMIN, URINE: Microalb, Ur: 14.01 mg/dL — ABNORMAL HIGH (ref 0.00–1.89)

## 2013-07-27 LAB — TSH: TSH: 0.612 u[IU]/mL (ref 0.350–4.500)

## 2013-07-27 NOTE — Progress Notes (Signed)
Subjective:    Patient ID: Whitney [name], female    DOB: 03/05/1928, 80 y.o.   MRN: [id number]  HPI Is a very pleasant 80 year old African American female today to establish care. She has no major concerns. She is followed by Dr. Dwyane Dee for her diabetes, hypothyroidism, and hyperlipidemia. He is scheduled to see her later this month for those issues. She is due for fasting lab work. She is also overdue for an annual eye exam. He has never had a colonoscopy. She has never had a mammogram. She has not had a Pap smear in several years. She's never had a bone density test. She had Pneumovax 23 in the past. She is due for Prevnar 13, Zostavax. Past Medical History  Diagnosis Date  . Hypertension   . Hyperlipidemia   . Diabetes mellitus without complication   . Thyroid disease     hypothyroidism  . Anemia   . Former smoker   . Multinodular goiter    Past Surgical History  Procedure Laterality Date  . Eye surgery  12/22/2009   Current Outpatient Prescriptions on File Prior to Visit  Medication Sig Dispense Refill  . amLODipine (NORVASC) 5 MG tablet Take 1 tablet (5 mg total) by mouth daily.  30 tablet  5  . aspirin 81 MG tablet Take 81 mg by mouth daily.      Marland Kitchen atorvastatin (LIPITOR) 10 MG tablet Take 1 tablet (10 mg total) by mouth daily.  30 tablet  5  . [facility]S VITAMIN D 2000 UNITS CAPS TAKE 1 CAPSULE BY MOUTH DAILY FOR 90 DAYS  100 capsule  3  . ferrous sulfate 325 (65 FE) MG EC tablet Take 325 mg by mouth daily with breakfast.      . furosemide (LASIX) 40 MG tablet Take 1-2 tablets daily as needed  60 tablet  5  . glucose blood test strip 1 each by Other route as needed for other (One Touch Ultra). Use as instructed      . levothyroxine (SYNTHROID, LEVOTHROID) 50 MCG tablet Take 1 tablet (50 mcg total) by mouth daily before breakfast.  30 tablet  5  . lisinopril-hydrochlorothiazide (PRINZIDE,ZESTORETIC) 20-12.5 MG per tablet Take 1 tablet by mouth daily.  30 tablet  5  . metFORMIN  (GLUCOPHAGE-XR) 500 MG 24 hr tablet TAKE 1 TABLET BY MOUTH IN THE MORNING AND 2 AT NIGHT      . metoprolol succinate (TOPROL-XL) 25 MG 24 hr tablet Take 1 tablet (25 mg total) by mouth daily.  30 tablet  5  . pioglitazone (ACTOS) 30 MG tablet Take 1 tablet (30 mg total) by mouth daily.  30 tablet  5  . potassium chloride SA (K-DUR,KLOR-CON) 20 MEQ tablet Take 1 tablet (20 mEq total) by mouth daily.  30 tablet  5  . vitamin B-12 (CYANOCOBALAMIN) 1000 MCG tablet Take 1,000 mcg by mouth daily.       No current facility-administered medications on file prior to visit.   No Known Allergies History   Social History  . Marital Status: Widowed    Spouse Name: N/A    Number of Children: N/A  . Years of Education: N/A   Occupational History  . Not on file.   Social History Main Topics  . Smoking status: Former Research scientist (life sciences)  . Smokeless tobacco: Never Used  . Alcohol Use: No  . Drug Use: No  . Sexual Activity: No   Other Topics Concern  . Not on file   Social History Narrative  .  No narrative on file       Review of Systems  Constitutional: Negative.   HENT: Negative.   Respiratory: Negative.   Cardiovascular: Negative.   Gastrointestinal: Negative.   Genitourinary: Negative.   Neurological: Negative.   Hematological: Negative.        Objective:   Physical Exam  Vitals reviewed. Constitutional: She is oriented to person, place, and time. She appears well-developed and well-nourished. No distress.  HENT:  Head: Normocephalic.  Right Ear: External ear normal.  Left Ear: External ear normal.  Nose: Nose normal.  Mouth/Throat: Oropharynx is clear and moist. No oropharyngeal exudate.  Eyes: Conjunctivae are normal.  Neck: Neck supple. No JVD present. Thyromegaly present.  Cardiovascular: Normal rate, regular rhythm and intact distal pulses.   Murmur heard. Pulmonary/Chest: Effort normal and breath sounds normal. No respiratory distress. She has no wheezes. She has no rales.  She exhibits no tenderness.  Abdominal: Soft. Bowel sounds are normal. She exhibits no distension. There is no tenderness. There is no rebound and no guarding.  Musculoskeletal: She exhibits edema.  Lymphadenopathy:    She has no cervical adenopathy.  Neurological: She is alert and oriented to person, place, and time. She has normal reflexes. She displays normal reflexes. She exhibits normal muscle tone. Coordination normal.  Skin: She is not diaphoretic.  Psychiatric: She has a normal mood and affect. Her behavior is normal. Judgment and thought content normal.          Assessment & Plan:  1. HTN (hypertension) Patient's blood pressure is well controlled.  However we may want to discontinue hydrochlorothiazide given the fact the patient is taking Lasix and her urine microalbumin is elevated I would discontinue hydrochlorothiazide and increase lisinopril to compensate - Lipid panel - CBC with Differential  2. HLD (hyperlipidemia) Return fasting for a fasting lipid panel. Goal LDL is less than 100. - Lipid panel - COMPLETE METABOLIC PANEL WITH GFR  3. Unspecified hypothyroidism Check TSH - TSH  4. Type II or unspecified type diabetes mellitus without mention of complication, not stated as uncontrolled Check hemoglobin A1c. Hemoglobin A1c was less than 6.5. Also check a urine microalbumin. The patient's urine microalbumin is elevated, we may want to discontinue hydrochlorothiazide and increase the lisinopril to 40 mg by mouth daily. - Microalbumin, urine - Hemoglobin A1c  5. Routine general medical examination at a health care facility Patient received Prevnar 13 today in the office. I scheduled her for a screening mammogram and screening bone density test. She is due for colonoscopy.  I discussed with the patient that a  colonoscopy is looking for cancer that may harm her in 5-10 years. At the present time I am not sure that it is prudent to put her through that test. However, if  her other lab work is normal and her health problems are stable, I would suggest a colonoscopy at her next office visit. - MM Digital Screening; Future - DG Bone Density; Future

## 2013-08-01 ENCOUNTER — Telehealth: Payer: Self-pay | Admitting: Family Medicine

## 2013-08-01 MED ORDER — LISINOPRIL 40 MG PO TABS: 40.0000 mg | ORAL_TABLET | Freq: Every day | ORAL | Status: DC

## 2013-08-01 NOTE — Telephone Encounter (Signed)
Spoke to patient about lab work.  Went over with her very carefully about stopping the Lisinopril/HCTZ  And to start Lisinopril 40 mg only QD.  Verbal order to pharmacy to also explain to patient.

## 2013-08-01 NOTE — Telephone Encounter (Signed)
Message copied by Olena Mater on Tue Aug 01, 2013  2:54 PM ------      Message from: Jenna Luo      Created: Fri Jul 28, 2013  7:37 AM       Labs are excellent except protein in urine is elevated. D/c lisinopril/hctz and start lisinopril 40 poqday instead.  Recheck in 6 months. ------

## 2013-08-03 ENCOUNTER — Other Ambulatory Visit: Payer: Medicare Other

## 2013-08-03 ENCOUNTER — Ambulatory Visit: Payer: Medicare Other | Admitting: Endocrinology

## 2013-08-09 ENCOUNTER — Other Ambulatory Visit: Payer: Medicare Other

## 2013-08-09 ENCOUNTER — Ambulatory Visit (INDEPENDENT_AMBULATORY_CARE_PROVIDER_SITE_OTHER): Payer: Medicare Other | Admitting: Endocrinology

## 2013-08-09 ENCOUNTER — Encounter: Payer: Self-pay | Admitting: Endocrinology

## 2013-08-09 VITALS — BP 136/78 | HR 78 | Temp 97.9°F | Resp 14 | Ht 67.0 in | Wt 202.6 lb

## 2013-08-09 DIAGNOSIS — E042 Nontoxic multinodular goiter: Secondary | ICD-10-CM

## 2013-08-09 DIAGNOSIS — I10 Essential (primary) hypertension: Secondary | ICD-10-CM

## 2013-08-09 DIAGNOSIS — E119 Type 2 diabetes mellitus without complications: Secondary | ICD-10-CM

## 2013-08-09 DIAGNOSIS — E559 Vitamin D deficiency, unspecified: Secondary | ICD-10-CM

## 2013-08-09 DIAGNOSIS — E876 Hypokalemia: Secondary | ICD-10-CM

## 2013-08-09 LAB — MICROALBUMIN / CREATININE URINE RATIO
Creatinine,U: 119 mg/dL
Microalb Creat Ratio: 8.5 mg/g (ref 0.0–30.0)
Microalb, Ur: 10.1 mg/dL — ABNORMAL HIGH (ref 0.0–1.9)

## 2013-08-09 LAB — BASIC METABOLIC PANEL
BUN: 21 mg/dL (ref 6–23)
CO2: 28 mEq/L (ref 19–32)
Calcium: 10 mg/dL (ref 8.4–10.5)
Chloride: 102 mEq/L (ref 96–112)
Creatinine, Ser: 0.8 mg/dL (ref 0.4–1.2)
GFR: 85.52 mL/min (ref >60.00)
Glucose, Bld: 115 mg/dL — ABNORMAL HIGH (ref 70–99)
Potassium: 4 mEq/L (ref 3.5–5.1)
Sodium: 139 mEq/L (ref 135–145)

## 2013-08-09 LAB — GLUCOSE, POCT (MANUAL RESULT ENTRY): POC Glucose: 145 mg/dl — AB (ref 70–99)

## 2013-08-09 NOTE — Progress Notes (Signed)
Patient ID: Whitney [name], female   DOB: Oct 23, 1927, 80 y.o.   MRN: [id number]   Reason for Appointment: Diabetes follow-up   History of Present Illness   Diagnosis: Type 2 DIABETES MELITUS, date of diagnosis:  1983     Previous history: She has been taking metformin and Actos for several years. She usually has had excellent control with upper normal A1c  Recent history: Her metformin was reduced from her dose of 2000 mg a day to 1500 mg because of her age. Is tolerating this and also Actos without side effects. Her blood sugars are overall well controlled with upper normal A1c again.  Did not bring her monitor for download today Not able to do much exercise, does walk indoors mostly       Monitors blood glucose: Once a day.    Glucometer: One Touch.          Blood Glucose readings from recall: about 140  Hypoglycemia frequency:  none        Meals: 3 meals per day.          Physical activity: exercise: Only walking around the house           Dietician visit: Most recent:?         Complications: are: None   Wt Readings from Last 3 Encounters:  08/09/13 202 lb 9.6 oz (91.899 kg)  07/27/13 205 lb (92.987 kg)  04/07/13 206 lb 4.8 oz (93.577 kg)   Eye exams: last in 4/14  LABS:  Lab Results  Component Value Date   HGBA1C 6.1* 07/27/2013   HGBA1C 6.0 04/07/2013   HGBA1C 6.0 11/24/2012   Lab Results  Component Value Date   MICROALBUR 14.01* 07/27/2013   LDLCALC 66 04/07/2013   CREATININE 0.88 07/27/2013         Medication List       This list is accurate as of: 08/09/13 10:40 AM.  Always use your most recent med list.               amLODipine 5 MG tablet  Commonly known as:  NORVASC  Take 1 tablet (5 mg total) by mouth daily.     aspirin 81 MG tablet  Take 81 mg by mouth daily.     atorvastatin 10 MG tablet  Commonly known as:  LIPITOR  Take 1 tablet (10 mg total) by mouth daily.     CVS VITAMIN D 2000 UNITS Caps  Generic drug:  Cholecalciferol  TAKE 1  CAPSULE BY MOUTH DAILY FOR 90 DAYS     ferrous sulfate 325 (65 FE) MG EC tablet  Take 325 mg by mouth daily with breakfast.     furosemide 40 MG tablet  Commonly known as:  LASIX  Take 1-2 tablets daily as needed     glucose blood test strip  1 each by Other route as needed for other (One Touch Ultra). Use as instructed     levothyroxine 50 MCG tablet  Commonly known as:  SYNTHROID, LEVOTHROID  Take 1 tablet (50 mcg total) by mouth daily before breakfast.     lisinopril 40 MG tablet  Commonly known as:  PRINIVIL,ZESTRIL  Take 1 tablet (40 mg total) by mouth daily.     metFORMIN 500 MG 24 hr tablet  Commonly known as:  GLUCOPHAGE-XR  TAKE 1 TABLET BY MOUTH IN THE MORNING AND 2 AT NIGHT     metoprolol succinate 25 MG 24 hr tablet  Commonly known as:  TOPROL-XL  Take 1 tablet (25 mg total) by mouth daily.     pioglitazone 30 MG tablet  Commonly known as:  ACTOS  Take 1 tablet (30 mg total) by mouth daily.     potassium chloride SA 20 MEQ tablet  Commonly known as:  K-DUR,KLOR-CON  Take 1 tablet (20 mEq total) by mouth daily.     vitamin B-12 1000 MCG tablet  Commonly known as:  CYANOCOBALAMIN  Take 1,000 mcg by mouth daily.        Allergies: No Known Allergies  Past Medical History  Diagnosis Date  . Hypertension   . Hyperlipidemia   . Diabetes mellitus without complication   . Thyroid disease     hypothyroidism  . Anemia   . Former smoker   . Multinodular goiter     Past Surgical History  Procedure Laterality Date  . Eye surgery  12/22/2009    No family history on file.  Social History:  reports that she has quit smoking. She has never used smokeless tobacco. She reports that she does not drink alcohol or use illicit drugs.  Review of Systems:  Hypertension: She has had good control and her lisinopril HCTZ was changed to lisinopril by PCP because of high microalbumin level. However the microalbumin/creatinine ratio was not done and has been  historically normal  Lipids: Has been on long-term Lipitor with good control    She was taking Lasix for previous history of edema and is taking this 2-3 days a week.  Does not think she has significant edema   Hypokalemia: Her potassium was low on the last visit and she was told to take her potassium supplement daily  She has had vitamin D deficiency, her level was 35 with 2000 units vitamin D 3  HYPOTHYROIDISM: She has had a multinodular goiter and not clear if she is truly hypothyroid. Over the last couple of years her dose has been tapered down because of low normal TSH levels and has not had any increase in the size of her goiter.  She is compliant with her thyroid medication in the morning before breakfast. No fatigue  TSH now normal  Lab Results  Component Value Date   TSH 0.612 07/27/2013    Examination:   BP 136/78  Pulse 78  Temp(Src) 97.9 F (36.6 C)  Resp 14  Ht 5\' 7"  (1.702 m)  Wt 202 lb 9.6 oz (91.899 kg)  BMI 31.72 kg/m2  SpO2 94%  Body mass index is 31.72 kg/(m^2).   Thyroid just palpable on swallowing on the right side, smooth and firm, barely palpable on the left  Heart sounds normal  No ankle edema  ASSESSMENT/ PLAN:  Diabetes type 2   Blood glucose control is overall fairly good with Actos and metformin considering her age She has not brought her monitor for download. Again she thinks she is checking her blood sugars only in the morning despite discussion about post prandial monitoring Her weight gain has leveled off. Advised her to start walking at a mall or other locations  Reminded her to get her followup eye exam.  Urine microalbumin ratio will be checked today. Explained to the patient that the urine microalbumin/creatinine ratio is important rather than microalbumin by itself  EDEMA: Not present now; she can continue taking Lasix as needed only.  Hypokalemia: Followup level to be done today since she is now off HCTZ and on a higher dose of  lisinopril as per PCP  Hypertension: Well controlled  with current regimen of lisinopril and amlodipine, will need to check her renal function today  Multinodular goiter: On low doses of thyroxine suppression with normal TSH, will continue and monitor TSH  periodically  Elayne Snare 08/09/2013, 10:40 AM   LABS:  Potassium low, will need to take her supplement more consistently  Office Visit on 08/09/2013  Component Date Value Ref Range Status  . POC Glucose 08/09/2013 145* 70 - 99 mg/dl Final

## 2013-08-10 LAB — VITAMIN D 25 HYDROXY (VIT D DEFICIENCY, FRACTURES): Vit D, 25-Hydroxy: 37.4 ng/mL (ref 30.0–100.0)

## 2013-08-10 NOTE — Progress Notes (Signed)
Quick Note:  Rhonda: Please let patient know that urine protein is normal, potassium is normal, may take potassium 3 times a week only ______

## 2013-08-16 ENCOUNTER — Other Ambulatory Visit: Payer: Self-pay | Admitting: Endocrinology

## 2013-09-20 ENCOUNTER — Other Ambulatory Visit: Payer: Self-pay | Admitting: *Deleted

## 2013-09-20 MED ORDER — FUROSEMIDE 40 MG PO TABS: ORAL_TABLET | ORAL | Status: DC

## 2013-09-21 ENCOUNTER — Other Ambulatory Visit: Payer: Self-pay | Admitting: *Deleted

## 2013-09-21 MED ORDER — ATORVASTATIN CALCIUM 10 MG PO TABS: ORAL_TABLET | ORAL | Status: DC

## 2013-09-21 MED ORDER — LISINOPRIL-HYDROCHLOROTHIAZIDE 20-12.5 MG PO TABS: ORAL_TABLET | ORAL | Status: DC

## 2013-09-21 MED ORDER — METOPROLOL SUCCINATE ER 25 MG PO TB24: 25.0000 mg | ORAL_TABLET | Freq: Every day | ORAL | Status: DC

## 2013-09-21 MED ORDER — PIOGLITAZONE HCL 30 MG PO TABS: 30.0000 mg | ORAL_TABLET | Freq: Every day | ORAL | Status: DC

## 2013-09-21 MED ORDER — LEVOTHYROXINE SODIUM 50 MCG PO TABS: 50.0000 ug | ORAL_TABLET | Freq: Every day | ORAL | Status: DC

## 2013-09-21 MED ORDER — POTASSIUM CHLORIDE CRYS ER 20 MEQ PO TBCR: EXTENDED_RELEASE_TABLET | ORAL | Status: DC

## 2013-09-21 MED ORDER — AMLODIPINE BESYLATE 5 MG PO TABS: ORAL_TABLET | ORAL | Status: DC

## 2013-09-26 ENCOUNTER — Other Ambulatory Visit: Payer: Self-pay | Admitting: Family Medicine

## 2013-09-26 DIAGNOSIS — Z1231 Encounter for screening mammogram for malignant neoplasm of breast: Secondary | ICD-10-CM

## 2013-10-06 ENCOUNTER — Ambulatory Visit
Admission: RE | Admit: 2013-10-06 | Discharge: 2013-10-06 | Disposition: A | Discharge status: Home / Self Care | Payer: Medicare Other | Source: Ambulatory Visit | Attending: Family Medicine | Admitting: Family Medicine

## 2013-10-06 DIAGNOSIS — Z1231 Encounter for screening mammogram for malignant neoplasm of breast: Secondary | ICD-10-CM

## 2013-10-09 ENCOUNTER — Other Ambulatory Visit: Payer: Self-pay | Admitting: Family Medicine

## 2013-10-09 DIAGNOSIS — R928 Other abnormal and inconclusive findings on diagnostic imaging of breast: Secondary | ICD-10-CM

## 2013-10-14 DIAGNOSIS — C50919 Malignant neoplasm of unspecified site of unspecified female breast: Secondary | ICD-10-CM

## 2013-10-14 HISTORY — DX: Malignant neoplasm of unspecified site of unspecified female breast: C50.919

## 2013-10-20 ENCOUNTER — Ambulatory Visit
Admission: RE | Admit: 2013-10-20 | Discharge: 2013-10-20 | Disposition: A | Discharge status: Home / Self Care | Payer: Medicare Other | Source: Ambulatory Visit | Attending: Family Medicine | Admitting: Family Medicine

## 2013-10-20 ENCOUNTER — Inpatient Hospital Stay: Admission: RE | Admit: 2013-10-20 | Payer: Medicare Other | Source: Ambulatory Visit

## 2013-10-20 ENCOUNTER — Other Ambulatory Visit: Payer: Medicare Other

## 2013-10-20 ENCOUNTER — Other Ambulatory Visit: Payer: Self-pay | Admitting: Family Medicine

## 2013-10-20 DIAGNOSIS — R928 Other abnormal and inconclusive findings on diagnostic imaging of breast: Secondary | ICD-10-CM

## 2013-10-23 ENCOUNTER — Other Ambulatory Visit: Payer: Self-pay | Admitting: Family Medicine

## 2013-10-23 DIAGNOSIS — C50919 Malignant neoplasm of unspecified site of unspecified female breast: Secondary | ICD-10-CM

## 2013-10-24 ENCOUNTER — Telehealth: Payer: Self-pay | Admitting: *Deleted

## 2013-10-24 DIAGNOSIS — C50411 Malignant neoplasm of upper-outer quadrant of right female breast: Secondary | ICD-10-CM

## 2013-10-24 NOTE — Telephone Encounter (Signed)
Confirmed [facility] for 11/01/13 at 12N .  Instructions and contact information given. 

## 2013-10-26 ENCOUNTER — Telehealth: Payer: Self-pay | Admitting: *Deleted

## 2013-10-26 NOTE — Telephone Encounter (Signed)
I received a call from someone at [facility] stating that pt has [facility] compass and needed to have referral, I looked pt up and she does not have compass she has Engineer, site, tried online thru [facility] and pt could not be found.

## 2013-10-31 ENCOUNTER — Ambulatory Visit
Admission: RE | Admit: 2013-10-31 | Discharge: 2013-10-31 | Disposition: A | Discharge status: Home / Self Care | Payer: Medicare Other | Source: Ambulatory Visit | Attending: Family Medicine | Admitting: Family Medicine

## 2013-10-31 DIAGNOSIS — C50919 Malignant neoplasm of unspecified site of unspecified female breast: Secondary | ICD-10-CM

## 2013-10-31 MED ORDER — GADOBENATE DIMEGLUMINE 529 MG/ML IV SOLN
19.0000 mL | Freq: Once | INTRAVENOUS | Status: AC | PRN
  Administered 2013-10-31: 19 mL via INTRAVENOUS

## 2013-11-01 ENCOUNTER — Encounter: Payer: Self-pay | Admitting: Hematology and Oncology

## 2013-11-01 ENCOUNTER — Ambulatory Visit (HOSPITAL_BASED_OUTPATIENT_CLINIC_OR_DEPARTMENT_OTHER): Payer: Medicare Other | Admitting: Surgery

## 2013-11-01 ENCOUNTER — Ambulatory Visit
Admission: RE | Admit: 2013-11-01 | Discharge: 2013-11-01 | Disposition: A | Discharge status: Home / Self Care | Payer: Medicare Other | Source: Ambulatory Visit | Attending: Radiation Oncology | Admitting: Radiation Oncology

## 2013-11-01 ENCOUNTER — Other Ambulatory Visit (HOSPITAL_BASED_OUTPATIENT_CLINIC_OR_DEPARTMENT_OTHER): Payer: Medicare Other

## 2013-11-01 ENCOUNTER — Encounter: Payer: Self-pay | Admitting: General Practice

## 2013-11-01 ENCOUNTER — Ambulatory Visit (HOSPITAL_BASED_OUTPATIENT_CLINIC_OR_DEPARTMENT_OTHER): Payer: Medicare Other | Admitting: Hematology and Oncology

## 2013-11-01 ENCOUNTER — Telehealth: Payer: Self-pay | Admitting: Hematology and Oncology

## 2013-11-01 ENCOUNTER — Ambulatory Visit: Payer: Medicare Other

## 2013-11-01 VITALS — BP 156/89 | HR 89 | Temp 98.4°F | Resp 18 | Ht 67.0 in | Wt 202.4 lb

## 2013-11-01 DIAGNOSIS — C50419 Malignant neoplasm of upper-outer quadrant of unspecified female breast: Secondary | ICD-10-CM

## 2013-11-01 DIAGNOSIS — E039 Hypothyroidism, unspecified: Secondary | ICD-10-CM

## 2013-11-01 DIAGNOSIS — I10 Essential (primary) hypertension: Secondary | ICD-10-CM

## 2013-11-01 DIAGNOSIS — C50412 Malignant neoplasm of upper-outer quadrant of left female breast: Secondary | ICD-10-CM

## 2013-11-01 DIAGNOSIS — C50911 Malignant neoplasm of unspecified site of right female breast: Secondary | ICD-10-CM

## 2013-11-01 DIAGNOSIS — C50919 Malignant neoplasm of unspecified site of unspecified female breast: Secondary | ICD-10-CM | POA: Insufficient documentation

## 2013-11-01 DIAGNOSIS — C773 Secondary and unspecified malignant neoplasm of axilla and upper limb lymph nodes: Secondary | ICD-10-CM

## 2013-11-01 DIAGNOSIS — C50411 Malignant neoplasm of upper-outer quadrant of right female breast: Secondary | ICD-10-CM | POA: Insufficient documentation

## 2013-11-01 DIAGNOSIS — E119 Type 2 diabetes mellitus without complications: Secondary | ICD-10-CM

## 2013-11-01 DIAGNOSIS — Z170 Estrogen receptor positive status [ER+]: Secondary | ICD-10-CM

## 2013-11-01 LAB — CBC WITH DIFFERENTIAL/PLATELET
BASO%: 0.9 % (ref 0.0–2.0)
Basophils Absolute: 0.1 10*3/uL (ref 0.0–0.1)
EOS%: 1.5 % (ref 0.0–7.0)
Eosinophils Absolute: 0.1 10*3/uL (ref 0.0–0.5)
HCT: 35.4 % (ref 34.8–46.6)
HGB: 11.4 g/dL — ABNORMAL LOW (ref 11.6–15.9)
LYMPH%: 15.1 % (ref 14.0–49.7)
MCH: 28.2 pg (ref 25.1–34.0)
MCHC: 32.2 g/dL (ref 31.5–36.0)
MCV: 87.6 fL (ref 79.5–101.0)
MONO#: 0.5 10*3/uL (ref 0.1–0.9)
MONO%: 8.5 % (ref 0.0–14.0)
NEUT#: 4.7 10*3/uL (ref 1.5–6.5)
NEUT%: 74 % (ref 38.4–76.8)
Platelets: 271 10*3/uL (ref 145–400)
RBC: 4.04 10*6/uL (ref 3.70–5.45)
RDW: 15.8 % — ABNORMAL HIGH (ref 11.2–14.5)
WBC: 6.3 10*3/uL (ref 3.9–10.3)
lymph#: 1 10*3/uL (ref 0.9–3.3)

## 2013-11-01 LAB — COMPREHENSIVE METABOLIC PANEL (CC13)
ALT: 7 U/L (ref 0–55)
AST: 15 U/L (ref 5–34)
Albumin: 3.6 g/dL (ref 3.5–5.0)
Alkaline Phosphatase: 62 U/L (ref 40–150)
Anion Gap: 10 mEq/L (ref 3–11)
BUN: 17.5 mg/dL (ref 7.0–26.0)
CO2: 27 mEq/L (ref 22–29)
Calcium: 10.3 mg/dL (ref 8.4–10.4)
Chloride: 104 mEq/L (ref 98–109)
Creatinine: 0.9 mg/dL (ref 0.6–1.1)
Glucose: 135 mg/dl (ref 70–140)
Potassium: 4 mEq/L (ref 3.5–5.1)
Sodium: 141 mEq/L (ref 136–145)
Total Bilirubin: 0.33 mg/dL (ref 0.20–1.20)
Total Protein: 6.9 g/dL (ref 6.4–8.3)

## 2013-11-01 NOTE — Patient Instructions (Signed)
Implanted Port Insertion  An implanted port is a central line that has a round shape and is placed under the skin. It is used as a long-term IV access for:    Medicines, such as chemotherapy.    Fluids.    Liquid nutrition, such as total parenteral nutrition (TPN).    Blood samples.   LET YOUR HEALTH CARE PROVIDER KNOW ABOUT:   Allergies to food or medicine.    Medicines taken, including vitamins, herbs, eye drops, creams, and over-the-counter medicines.    Any allergies to heparin.   Use of steroids (by mouth or creams).    Previous problems with anesthetics or numbing medicines.    History of bleeding problems or blood clots.    Previous surgery.    Other health problems, including diabetes and kidney problems.    Possibility of pregnancy, if this applies.  RISKS AND COMPLICATIONS  Generally, this is a safe procedure. However, as with any procedure, problems can occur. Possible problems include:   Damage to the blood vessel, bruising, or bleeding at the puncture site.    Infection.   Blood clot in the vessel that the port is in.   Breakdown of the skin over your port.   Very rarely a person may develop a condition called a pneumothorax, a collection of air in the chest that may cause one of the lungs to collapse. The placement of these catheters with the appropriate imaging guidance significantly decreases the risk of a pneumothorax.   BEFORE THE PROCEDURE    Your health care provider may want you to have blood tests. These tests can help tell how well your kidneys and liver are working. They can also show how well your blood clots.    If you take blood thinners (anticoagulant medicines), ask your health care provider when you should stop taking them.    Make arrangements for someone to drive you home. This is necessary if you have been sedated for your procedure.   PROCEDURE   Port insertion usually takes about 30-45 minutes.    An IV needle will be inserted in your arm.  Medicine for pain and medicine to help relax you (sedative) will flow directly into your body through this needle.    You will lie on an exam table, and you will be connected to monitors to keep track of your heart rate, blood pressure, and breathing throughout the procedure.   An oxygen monitoring device may be attached to your finger. Oxygen will be given.    Everything will be kept as germ free (sterile) as possible during the procedure. The skin near the point of the incision will be cleansed with antiseptic, and the area will be draped with sterile towels. The skin and deeper tissues over the port area will be made numb with a local anesthetic.   Two small cuts (incisions) will be made in the skin to insert the port. One will be made in the neck to obtain access to the vein where the catheter will lie.    Because the port reservoir will be placed under the skin, a small skin incision will be made in the upper chest, and a small pocket for the port will be made under the skin. The catheter that will be connected to the port tunnels to a large central vein in the chest. A small, raised area will remain on your body at the site of the reservoir when the procedure is complete.   The port placement   will be done under imaging guidance to ensure the proper placement.   The reservoir has a silicone covering that can be punctured with a special needle.    The port will be flushed with normal saline, and blood will be drawn to make sure it is working properly.   There will be nothing remaining outside the skin when the procedure is finished.    Incisions will be held together by stitches, surgical glue, or a special tape.  AFTER THE PROCEDURE   You will stay in a recovery area until the anesthesia has worn off. Your blood pressure and pulse will be checked.   A final chest X-ray will be taken to check the placement of the port and to ensure that there is no injury to your lung.  Document Released:  12/21/2012 Document Revised: 07/17/2013 Document Reviewed: 12/21/2012  [employer] Patient Information 2015 [employer]. This information is not intended to replace advice given to you by your health care provider. Make sure you discuss any questions you have with your health care provider.

## 2013-11-01 NOTE — Progress Notes (Signed)
[facility] NOTE  Patient Care Team: [name], MD as PCP - General (Family Medicine) [name], MD as Consulting Physician (Hematology and Oncology) Joyice Faster. Cornett, MD as Consulting Physician (General Surgery) [name], MD as Consulting Physician (Radiation Oncology)  CHIEF COMPLAINTS/PURPOSE OF CONSULTATION:  Newly diagnosed breast cancer  HISTORY OF PRESENTING ILLNESS:  Whitney [name] 80 y.o. African American female is here because of recent diagnosis of right breast cancer. Patient says she felt a lump for a while but did not bring to the attention of anybody and she was referred for a mammogram after being evaluated by her PCP. The mammogram done on 10/20/2013 revealed a 2.1 x 2.1 x 1.9 cm right breast lesion this was immediately biopsy and it was proven to be invasive ductal carcinoma one lymph node was noted that was biopsy-proven to be malignancy as well. There was lymphovascular invasion it was grade 1 ER/PR positive and HER-2/neu is pending. She underwent an MRI on August 18 that revealed a right upper outer quadrant 3.1 cm mass with right axillary lymph node 1.1 cm. Clinically she was staged as T2 N1 stage IIB. We are seeing her today in the multidisciplinary breast clinic to discuss the appropriate treatment plan. Patient is here accompanied by her granddaughter and her son. She does not report any pain discomfort in the breast denies any fatigue weight loss loss of appetite. She has multiple chronic medical problems including diabetes and hypertension but she reports that there are reasonably well managed.   I reviewed her records extensively and collaborated the history with the patient.  SUMMARY OF ONCOLOGIC HISTORY:   Breast cancer of upper-outer quadrant of left female breast   10/20/2013 Mammogram Ultrasound and mammogram showed 2.1 x 2.1 x 1.9 cm right breast mass   10/20/2013 Initial Diagnosis Breast cancer of upper-outer quadrant of  left female breast. Invasive ductal cancer with lymphovascular invasion one lymph node biopsy that was positive for cancer grade 1   10/31/2013 Breast MRI Right breast upper outer quadrant: 3.1 x 1.9 x 2.2 cm: Right axillary lymph node 1.1 x 0.9 x 0.6 cm    In terms of breast cancer risk profile:  She menarched at early age of 89 and went to menopause at age 48  She had 4 pregnancy, her first child was born at age 67  She did not received birth control pills .  She has no family history of Breast/GYN/GI cancer  MEDICAL HISTORY:  Past Medical History  Diagnosis Date  . Hypertension   . Hyperlipidemia   . Diabetes mellitus without complication   . Thyroid disease     hypothyroidism  . Anemia   . Former smoker   . Multinodular goiter     SURGICAL HISTORY: Past Surgical History  Procedure Laterality Date  . Eye surgery  12/22/2009    SOCIAL HISTORY: History   Social History  . Marital Status: Widowed    Spouse Name: N/A    Number of Children: N/A  . Years of Education: N/A   Occupational History  . Not on file.   Social History Main Topics  . Smoking status: Never Smoker   . Smokeless tobacco: Never Used  . Alcohol Use: No  . Drug Use: No  . Sexual Activity: No   Other Topics Concern  . Not on file   Social History Narrative  . No narrative on file    FAMILY HISTORY: History reviewed. No pertinent family history.  ALLERGIES:  has No Known Allergies.  MEDICATIONS:  Current Outpatient Prescriptions  Medication Sig Dispense Refill  . amLODipine (NORVASC) 5 MG tablet TAKE 1 TABLET (5 MG TOTAL) BY MOUTH DAILY.  90 tablet  0  . aspirin 81 MG tablet Take 81 mg by mouth daily.      Marland Kitchen atorvastatin (LIPITOR) 10 MG tablet TAKE 1 TABLET (10 MG TOTAL) BY MOUTH DAILY.  90 tablet  0  . CVS VITAMIN D 2000 UNITS CAPS TAKE 1 CAPSULE BY MOUTH DAILY FOR 90 DAYS  100 capsule  3  . ferrous sulfate 325 (65 FE) MG EC tablet Take 325 mg by mouth daily with breakfast.      .  furosemide (LASIX) 40 MG tablet Take 1-2 tablets daily as needed  60 tablet  5  . glucose blood test strip 1 each by Other route as needed for other (One Touch Ultra). Use as instructed      . levothyroxine (SYNTHROID, LEVOTHROID) 50 MCG tablet Take 1 tablet (50 mcg total) by mouth daily before breakfast.  90 tablet  0  . lisinopril-hydrochlorothiazide (PRINZIDE,ZESTORETIC) 20-12.5 MG per tablet TAKE 1 TABLET BY MOUTH DAILY.  90 tablet  0  . metFORMIN (GLUCOPHAGE-XR) 500 MG 24 hr tablet TAKE 1 TABLET BY MOUTH IN THE MORNING AND 2 AT NIGHT      . metoprolol succinate (TOPROL-XL) 25 MG 24 hr tablet Take 1 tablet (25 mg total) by mouth daily.  90 tablet  0  . pioglitazone (ACTOS) 30 MG tablet Take 1 tablet (30 mg total) by mouth daily.  90 tablet  0  . potassium chloride SA (KLOR-CON M20) 20 MEQ tablet TAKE 1 TABLET (20 MEQ TOTAL) BY MOUTH DAILY.  90 tablet  0  . vitamin B-12 (CYANOCOBALAMIN) 1000 MCG tablet Take 1,000 mcg by mouth daily.       No current facility-administered medications for this visit.    REVIEW OF SYSTEMS:   Constitutional: Denies fevers, chills or abnormal night sweats Eyes: Denies blurriness of vision, double vision or watery eyes Ears, nose, mouth, throat, and face: Denies mucositis or sore throat Respiratory: Denies cough, dyspnea or wheezes Cardiovascular: Denies palpitation, chest discomfort or lower extremity swelling Gastrointestinal:  Denies nausea, heartburn or change in bowel habits Skin: Denies abnormal skin rashes Lymphatics: Denies new lymphadenopathy or easy bruising Neurological:Denies numbness, tingling or new weaknesses Behavioral/Psych: Mood is stable, no new changes  Breast: Denies any pain but a palpable nodule in the right breast All other systems were reviewed with the patient and are negative.  PHYSICAL EXAMINATION: ECOG PERFORMANCE STATUS: 1 - Symptomatic but completely ambulatory  Filed Vitals:   11/01/13 1244  BP: 156/89  Pulse: 89  Temp:  98.4 F (36.9 C)  Resp: 18   Filed Weights   11/01/13 1244  Weight: 202 lb 6.4 oz (91.808 kg)    GENERAL:alert, no distress and comfortable SKIN: skin color, texture, turgor are normal, no rashes or significant lesions EYES: normal, conjunctiva are pink and non-injected, sclera clear OROPHARYNX:no exudate, no erythema and lips, buccal mucosa, and tongue normal  NECK: supple, thyroid normal size, non-tender, without nodularity LYMPH:  no palpable lymphadenopathy in the cervical, axillary or inguinal LUNGS: clear to auscultation and percussion with normal breathing effort HEART: regular rate & rhythm and no murmurs and no lower extremity edema ABDOMEN:abdomen soft, non-tender and normal bowel sounds Musculoskeletal:no cyanosis of digits and no clubbing  PSYCH: alert & oriented x 3 with fluent speech NEURO: no focal motor/sensory  deficits BREAST:  LABORATORY DATA:  I have reviewed the data as listed Lab Results  Component Value Date   WBC 6.3 11/01/2013   HGB 11.4* 11/01/2013   HCT 35.4 11/01/2013   MCV 87.6 11/01/2013   PLT 271 11/01/2013   Lab Results  Component Value Date   NA 141 11/01/2013   K 4.0 11/01/2013   CL 102 08/09/2013   CO2 27 11/01/2013    RADIOGRAPHIC STUDIES: I have personally reviewed the radiological reports and agreed with the findings in the report.  ASSESSMENT AND PLAN:  Breast cancer of upper-outer quadrant of left female breast 1. Right breast invasive ductal carcinoma with 1 biopsy-proven right axillary lymph node. The cancer had lymphovascular invasion grade 1 ER/PR positive HER-2 is pending. Based on MRI it was 3.1 cm. Clinical stage TII N1 stage IIB.  2. I discussed the pathology in great detail including the size of the tumor the clinical stage and the receptors. Patient understands that presence of lymph node involvement signifies an aggressive cancer. I discussed with her the role of chemotherapy which can be given preoperatively on postoperatively.  Patient will benefit from antiestrogen therapy. The sequence of events will be decided based on surgeons preference to do surgery up front R. after neoadjuvant chemotherapy.  If HER-2 is amplified she will benefit from her-2 directed therapy even neoadjuvant or adjuvant setting.  3. In the adjuvant setting we can consider enrolling her in SWOG clinical trial using the Oncotype DX testing to see if she would benefit from chemotherapy or not based upon Oncotype DX score.   All questions were answered. The patient knows to call the clinic with any problems, questions or concerns. I spent 55 minutes counseling the patient face to face. The total time spent in the appointment was 60 minutes and more than 50% was on counseling.     [name], MD 11/01/2013 1:47 PM

## 2013-11-01 NOTE — Telephone Encounter (Signed)
, °

## 2013-11-01 NOTE — Assessment & Plan Note (Addendum)
1. Right breast invasive ductal carcinoma with 1 biopsy-proven right axillary lymph node. The cancer had lymphovascular invasion grade 1 ER/PR positive HER-2 is pending. Based on MRI it was 3.1 cm. Clinical stage TII N1 stage IIB.  2. I discussed the pathology in great detail including the size of the tumor the clinical stage and the receptors. Patient understands that presence of lymph node involvement signifies an aggressive cancer. I discussed with her the role of chemotherapy which can be given preoperatively on postoperatively. Patient will also benefit from antiestrogen therapy. If HER-2 is amplified she will benefit from her-2 directed therapy either in neoadjuvant or adjuvant setting.  3. After discussing with Dr.Cornetto, we determined that the best plan for her is to do neoadjuvant chemo and then do surgery. I discussed with her that treatments would depend on Her2 result. I discussed both options and provide her with treatment plans for HER-2 positive as well as HER-2 negative options. I would like to see her back in a week to go over the results of HER-2 testing as well as the PET/CT scan I would like to order for her staging evaluation. We can then finalize the neoadjuvant chemotherapy plan. I provided her with an overview of chemotherapy side effects.  4. Dr. Rosita Fire has consented her for a port placement. Echocardiogram will be ordered and she will be set up for a chemotherapy education  Labs were reviewed and her liver kidney functions electrolytes and hematology tests appeared to be normal except for mild anemia.

## 2013-11-01 NOTE — Progress Notes (Signed)
Patient ID: Whitney [name], female   DOB: Sep 24, 1927, 80 y.o.   MRN: [id number]  No chief complaint on file.   HPI Whitney [name] is a 80 y.o. female.  Pt sent at the request of Dr Dwyane Dee for right breast cancer found during screening mammogram.  Pt denies any breast mas,  Discharge or breast pain. HPI  Past Medical History  Diagnosis Date  . Hypertension   . Hyperlipidemia   . Diabetes mellitus without complication   . Thyroid disease     hypothyroidism  . Anemia   . Former smoker   . Multinodular goiter     Past Surgical History  Procedure Laterality Date  . Eye surgery  12/22/2009    No family history on file.  Social History History  Substance Use Topics  . Smoking status: Never Smoker   . Smokeless tobacco: Never Used  . Alcohol Use: No    No Known Allergies  Current Outpatient Prescriptions  Medication Sig Dispense Refill  . amLODipine (NORVASC) 5 MG tablet TAKE 1 TABLET (5 MG TOTAL) BY MOUTH DAILY.  90 tablet  0  . aspirin 81 MG tablet Take 81 mg by mouth daily.      Marland Kitchen atorvastatin (LIPITOR) 10 MG tablet TAKE 1 TABLET (10 MG TOTAL) BY MOUTH DAILY.  90 tablet  0  . CVS VITAMIN D 2000 UNITS CAPS TAKE 1 CAPSULE BY MOUTH DAILY FOR 90 DAYS  100 capsule  3  . ferrous sulfate 325 (65 FE) MG EC tablet Take 325 mg by mouth daily with breakfast.      . furosemide (LASIX) 40 MG tablet Take 1-2 tablets daily as needed  60 tablet  5  . glucose blood test strip 1 each by Other route as needed for other (One Touch Ultra). Use as instructed      . levothyroxine (SYNTHROID, LEVOTHROID) 50 MCG tablet Take 1 tablet (50 mcg total) by mouth daily before breakfast.  90 tablet  0  . lisinopril-hydrochlorothiazide (PRINZIDE,ZESTORETIC) 20-12.5 MG per tablet TAKE 1 TABLET BY MOUTH DAILY.  90 tablet  0  . metFORMIN (GLUCOPHAGE-XR) 500 MG 24 hr tablet TAKE 1 TABLET BY MOUTH IN THE MORNING AND 2 AT NIGHT      . metoprolol succinate (TOPROL-XL) 25 MG 24 hr tablet Take 1 tablet (25 mg  total) by mouth daily.  90 tablet  0  . pioglitazone (ACTOS) 30 MG tablet Take 1 tablet (30 mg total) by mouth daily.  90 tablet  0  . potassium chloride SA (KLOR-CON M20) 20 MEQ tablet TAKE 1 TABLET (20 MEQ TOTAL) BY MOUTH DAILY.  90 tablet  0  . vitamin B-12 (CYANOCOBALAMIN) 1000 MCG tablet Take 1,000 mcg by mouth daily.       No current facility-administered medications for this visit.    Review of Systems Review of Systems  Constitutional: Negative for fever, chills and unexpected weight change.  HENT: Negative for congestion, hearing loss, sore throat, trouble swallowing and voice change.   Eyes: Negative for visual disturbance.  Respiratory: Negative for cough and wheezing.   Cardiovascular: Negative for chest pain, palpitations and leg swelling.  Gastrointestinal: Negative for nausea, vomiting, abdominal pain, diarrhea, constipation, blood in stool, abdominal distention and anal bleeding.  Genitourinary: Negative for hematuria, vaginal bleeding and difficulty urinating.  Musculoskeletal: Negative for arthralgias.  Skin: Negative for rash and wound.  Neurological: Negative for seizures, syncope and headaches.  Hematological: Negative for adenopathy. Does not bruise/bleed easily.  Psychiatric/Behavioral: Negative  for confusion.    There were no vitals taken for this visit.  Physical Exam Physical Exam  Constitutional: She is oriented to person, place, and time. She appears well-developed and well-nourished.  HENT:  Head: Normocephalic.  Neck: Trachea normal.  Cardiovascular: Normal rate and regular rhythm.   Pulmonary/Chest: Effort normal. Right breast exhibits no inverted nipple, no mass, no nipple discharge, no skin change and no tenderness. Left breast exhibits no inverted nipple, no mass, no nipple discharge, no skin change and no tenderness. Breasts are symmetrical.  Musculoskeletal: Normal range of motion.  Lymphadenopathy:    She has no cervical adenopathy.    She has  no axillary adenopathy.  Neurological: She is alert and oriented to person, place, and time.  Skin: Skin is warm.  Psychiatric: She has a normal mood and affect. Her behavior is normal. Judgment and thought content normal.    Data Reviewed       MR Breast Bilateral W Wo Contrast Status: Final result         PACS Images    Show images for MR Breast Bilateral W Wo Contrast         Study Result    CLINICAL DATA: Recently diagnosed right breast invasive mammary  carcinoma. Preop evaluation.  LABS: BUN and creatinine were obtained on site at [facility] at  [address]  Results: BUN 20 mg/dL, Creatinine 0.8 mg/dL.  EXAM:  BILATERAL BREAST MRI WITH AND WITHOUT CONTRAST  TECHNIQUE:  Multiplanar, multisequence MR images of both breasts were obtained  prior to and following the intravenous administration of 57ml of  MultiHance  THREE-DIMENSIONAL MR IMAGE RENDERING ON INDEPENDENT WORKSTATION:  Three-dimensional MR images were rendered by post-processing of the  original MR data on an independent workstation. The  three-dimensional MR images were interpreted, and findings are  reported in the following complete MRI report for this study. Three  dimensional images were evaluated at the independent DynaCad  workstation  COMPARISON: Previous mammograms dated 10/20/2013, 10/06/2013.  Previous ultrasound images dated 10/20/2013.  FINDINGS:  Breast composition: b. Scattered fibroglandular tissue.  Background parenchymal enhancement: Mild  Right breast: There is an irregular enhancing mass located within  the upper outer quadrant of the right breast (posterior 1/3) which  corresponds to recently diagnosed right breast invasive mammary  carcinoma. Clip artifact is seen along the lateral posterior border  of this mass. The mass measures 3.1 x 1.9 x 2.2 cm in size and is  associated with a mixture of plateau and wash -in /washout  enhancement kinetics. There are  no additional worrisome enhancing  foci within the right breast.  Left breast: No mass or abnormal enhancement.  Lymph nodes: There is level 1 right axillary lymph node present  measuring 1.1 x 0.9 x 0.6 cm in size and associated with mild  decrease in central hilar fat. Most likely this corresponds to the  recently biopsied right axillary lymph node (under ultrasound  guidance). There are no additional abnormal appearing lymph nodes.  Ancillary findings: None.  IMPRESSION:  3.1 cm irregular enhancing mass located within the upper outer  quadrant of the right breast correspond to the recently diagnosed  right breast invasive mammary carcinoma. There is a slightly  abnormal appearing level 1 right axillary lymph node present which  most likely corresponds to the lymph node recently biopsied under  ultrasound guidance. No additional findings.  RECOMMENDATION:  Treatment plan.  BI-RADS CATEGORY 6: Known biopsy-proven malignancy.  Electronically Signed  By: Luberta Robertson M.D.  On: 10/31/2013 12:04             External Result Report    External Result Report            Imaging    Imaging Information            Signed by    Signed Date/Time   Phone Pager   [place] SR., [facility] 10/31/2013 12:04 PM [phone]         Exam Information    Status Exam Begun   Exam Ended     Final [99] 10/31/2013 10:13 AM 10/31/2013 10:40 AM                  Imaging Related Medications    Medication   gadobenate dimeglumine (MULTIHANCE) injection 19 mL   Route: Intravenous   Admin Dose: 19 mL   Volume: 20 mL   PRN Reason(s): contrast   Last Admin Time: 10/31/13 1042   Number of Doses: 1       Most Recent Administration:     User Action Time Recorded Time Dose Route Site Comment Action Reason    Lajoyce Lauber 10/31/13 1042 10/31/13 1042 19 mL Intravenous   Contrast Given     Full Administration Report                     Original  Order    Ordered On Ordered By     Mon Oct 23, 2013 10:54 AM Sharol Roussel                        MR Breast Bilateral W Wo Contrast (Order [id number]) Released By: Adelene Amas Date: 10/31/2013  Imaging Authorizing: [name], MD Department: [facility]  Order: [id number]               Order Information     Order Date/Time Release Date/Time Start Date/Time End Date/Time    10/31/13 08:43 AM 10/31/13 08:43 AM 10/31/13 08:43 AM 10/31/2013              Order Details     Frequency Duration Priority Order Class    As needed 1 occurrence Routine Ancillary Performed              Imaging CC Recipients              Associated Diagnoses       ICD-9-CM ICD-10-CM    Malignant neoplasm of breast (female), unspecified site  174.9 C50.919                       Collection Information     Resulting Agency: [facility]             Reviewed by List     [name], MD on 10/31/2013 5:28 PM    [name], MD on 10/31/2013 2:13 PM    [name], MD on 10/31/2013 12:46 PM              Order Provider Info       Office phone Pager/beeper E-mail    Ordering User Grenelefe Cox [phone] -- --    Authorizing Provider [name], MD [phone] -- --    Billing Provider F. Altamese Cabal Sr., MD [phone] --  Reprint Requisition     MR Breast Bilateral W Wo Contrast (Order #[id number]) on 10/31/13             Original Order     Ordered On Ordered By      Mon Oct 23, 2013 10:54 AM Marita [place] Cox                   Assessment    Right breast cancer stage 2 ER PR POSITIVE     Plan    Pt will need chemotherapy.  Recommend neoadjuvant chemotherapy and breast conservation down the road.  May be eligible for study.  Discussed port placement with risk,  Benefits and alternatives.  She wishes to proceed.          Nazirah Tri A. 11/01/2013, 2:59 PM

## 2013-11-01 NOTE — Progress Notes (Signed)
Radiation Oncology         [phone]) [phone] ________________________________  Initial outpatient Consultation - Date: 11/01/2013   Name: Whitney [name] MRN: [id number]   DOB: 1927/06/06  REFERRING PHYSICIAN: Brantley Stage Joyice Faster., MD  STAGE: Breast cancer of upper-outer quadrant of right female breast   Primary site: Breast (Right)   Staging method: AJCC 7th Edition   Clinical: Stage IIB (T2, N1, cM0) signed by [name], MD on 11/01/2013  8:37 AM   Summary: Stage IIB (T2, N1, cM0)  HISTORY OF PRESENT ILLNESS::Whitney [name] is a 80 y.o. female who palpated a right breast mass. She was referred for work up and was noted to have a right breast mass. Ultrasound showed a 2.1 cm mass in the upper outer quadrant as well as a suspicious lymph node. Both the mass and the invasive cancer were biopsied and were positive for invasive ductal carcinoma which was ER+PR+ and also had lymphovascular invasion.  HEr2 is pending. She was referred to multidisciplinary clinic today for discussion of options. She is post menopausal with her first menses at 21. She is GXP4 with her first birth at 8. She never took hormone replacement. She is accompanied by her son and granddaughter. She has pain in the right breast after her biopsy and some brusing.  PREVIOUS RADIATION THERAPY: No  PAST MEDICAL HISTORY:  has a past medical history of Hypertension; Hyperlipidemia; Diabetes mellitus without complication; Thyroid disease; Anemia; Former smoker; and Multinodular goiter.    PAST SURGICAL HISTORY: Past Surgical History  Procedure Laterality Date  . Eye surgery  12/22/2009    FAMILY HISTORY: No family history on file.  SOCIAL HISTORY:  History  Substance Use Topics  . Smoking status: Never Smoker   . Smokeless tobacco: Never Used  . Alcohol Use: No    ALLERGIES: Review of patient's allergies indicates no known allergies.  MEDICATIONS:  Current Outpatient Prescriptions  Medication Sig Dispense Refill   . amLODipine (NORVASC) 5 MG tablet TAKE 1 TABLET (5 MG TOTAL) BY MOUTH DAILY.  90 tablet  0  . aspirin 81 MG tablet Take 81 mg by mouth daily.      Marland Kitchen atorvastatin (LIPITOR) 10 MG tablet TAKE 1 TABLET (10 MG TOTAL) BY MOUTH DAILY.  90 tablet  0  . CVS VITAMIN D 2000 UNITS CAPS TAKE 1 CAPSULE BY MOUTH DAILY FOR 90 DAYS  100 capsule  3  . ferrous sulfate 325 (65 FE) MG EC tablet Take 325 mg by mouth daily with breakfast.      . furosemide (LASIX) 40 MG tablet Take 1-2 tablets daily as needed  60 tablet  5  . glucose blood test strip 1 each by Other route as needed for other (One Touch Ultra). Use as instructed      . levothyroxine (SYNTHROID, LEVOTHROID) 50 MCG tablet Take 1 tablet (50 mcg total) by mouth daily before breakfast.  90 tablet  0  . lisinopril-hydrochlorothiazide (PRINZIDE,ZESTORETIC) 20-12.5 MG per tablet TAKE 1 TABLET BY MOUTH DAILY.  90 tablet  0  . metFORMIN (GLUCOPHAGE-XR) 500 MG 24 hr tablet TAKE 1 TABLET BY MOUTH IN THE MORNING AND 2 AT NIGHT      . metoprolol succinate (TOPROL-XL) 25 MG 24 hr tablet Take 1 tablet (25 mg total) by mouth daily.  90 tablet  0  . pioglitazone (ACTOS) 30 MG tablet Take 1 tablet (30 mg total) by mouth daily.  90 tablet  0  . potassium chloride SA (KLOR-CON M20)  20 MEQ tablet TAKE 1 TABLET (20 MEQ TOTAL) BY MOUTH DAILY.  90 tablet  0  . vitamin B-12 (CYANOCOBALAMIN) 1000 MCG tablet Take 1,000 mcg by mouth daily.       No current facility-administered medications for this encounter.    REVIEW OF SYSTEMS:  A 15 point review of systems is documented in the electronic medical record. This was obtained by the nursing staff. However, I reviewed this with the patient to discuss relevant findings and make appropriate changes.  Pertinent items are noted in HPI.  PHYSICAL EXAM: There were no vitals filed for this visit.Marland KitchenPtotic breasts bilaterally. Large hematoma/mass in the upper outer quadrant of the right breast. No palpable abnormalities of the left breast.  No palpable adenopathy although she is tender under her arm. No lymphedema. Appears her stated age. Alert and oriented.   LABORATORY DATA:  Lab Results  Component Value Date   WBC 6.3 11/01/2013   HGB 11.4* 11/01/2013   HCT 35.4 11/01/2013   MCV 87.6 11/01/2013   PLT 271 11/01/2013   Lab Results  Component Value Date   NA 141 11/01/2013   K 4.0 11/01/2013   CL 102 08/09/2013   CO2 27 11/01/2013   Lab Results  Component Value Date   ALT 7 11/01/2013   AST 15 11/01/2013   ALKPHOS 62 11/01/2013   BILITOT 0.33 11/01/2013     RADIOGRAPHY: Mr Breast Bilateral W Wo Contrast  10/31/2013   CLINICAL DATA:  Recently diagnosed right breast invasive mammary carcinoma. Preop evaluation.  LABS:  BUN and creatinine were obtained on site at [facility] at  [address]  Results:  BUN 20 mg/dL,  Creatinine 0.8 mg/dL.  EXAM: BILATERAL BREAST MRI WITH AND WITHOUT CONTRAST  TECHNIQUE: Multiplanar, multisequence MR images of both breasts were obtained prior to and following the intravenous administration of 52m of MultiHance  THREE-DIMENSIONAL MR IMAGE RENDERING ON INDEPENDENT WORKSTATION:  Three-dimensional MR images were rendered by post-processing of the original MR data on an independent workstation. The three-dimensional MR images were interpreted, and findings are reported in the following complete MRI report for this study. Three dimensional images were evaluated at the independent DynaCad workstation  COMPARISON:  Previous mammograms dated 10/20/2013, 10/06/2013. Previous ultrasound images dated 10/20/2013.  FINDINGS: Breast composition: b.  Scattered fibroglandular tissue.  Background parenchymal enhancement: Mild  Right breast: There is an irregular enhancing mass located within the upper outer quadrant of the right breast (posterior 1/3) which corresponds to recently diagnosed right breast invasive mammary carcinoma. Clip artifact is seen along the lateral posterior border of this mass. The mass  measures 3.1 x 1.9 x 2.2 cm in size and is associated with a mixture of plateau and wash -in /washout enhancement kinetics. There are no additional worrisome enhancing foci within the right breast.  Left breast: No mass or abnormal enhancement.  Lymph nodes: There is level 1 right axillary lymph node present measuring 1.1 x 0.9 x 0.6 cm in size and associated with mild decrease in central hilar fat. Most likely this corresponds to the recently biopsied right axillary lymph node (under ultrasound guidance). There are no additional abnormal appearing lymph nodes.  Ancillary findings:  None.  IMPRESSION: 3.1 cm irregular enhancing mass located within the upper outer quadrant of the right breast correspond to the recently diagnosed right breast invasive mammary carcinoma. There is a slightly abnormal appearing level 1 right axillary lymph node present which most likely corresponds to the lymph node recently biopsied  under ultrasound guidance. No additional findings.  RECOMMENDATION: Treatment plan.  BI-RADS CATEGORY  6: Known biopsy-proven malignancy.   Electronically Signed   By: Luberta Robertson M.D.   On: 10/31/2013 12:04   Mm Digital Diagnostic Bilat  10/20/2013   CLINICAL DATA:  Screening callback from baseline mammogram for right breast mass and possible left breast asymmetry  EXAM: DIGITAL DIAGNOSTIC  bilateral MAMMOGRAM WITH CAD  ULTRASOUND right BREAST  COMPARISON:  Baseline mammogram 10/06/2013  ACR Breast Density Category b: There are scattered areas of fibroglandular density.  FINDINGS: There is a spiculated radiodense mass in the right upper outer quadrant, corresponding to the questioned mammographic finding. An axillary lymph node is identified with possible cortical thickening.  In the left breast, there is no persistent abnormality in the left upper outer quadrant.  Mammographic images were processed with CAD.  On physical exam, I palpate a firm mass right upper outer quadrant.  Ultrasound is performed,  showing an irregular hypoechoic shadowing mass right breast 10 o'clock location 5 cm from the nipple measuring 2.1 x 2.1 x 1.9 cm. This corresponds to the palpable and mammographic finding. In the right axilla, there are at least 2 nodes with cortical thickening, one measuring 3 mm in short axis and other measuring 5 mm in short axis diameter.  IMPRESSION: Suspicious right breast mass 10 o'clock location with abnormal right axillary lymph node cortical thickening. Biopsy of both areas will be performed and dictated separately.  No evidence for malignancy in the left breast.  RECOMMENDATION: Right breast and axilla ultrasound-guided core biopsy  I have discussed the findings and recommendations with the patient. Results were also provided in writing at the conclusion of the visit. If applicable, a reminder letter will be sent to the patient regarding the next appointment.  BI-RADS CATEGORY  4: Suspicious.   Electronically Signed   By: Conchita Paris M.D.   On: 10/20/2013 11:24   Mm Digital Diagnostic Unilat R  10/20/2013   CLINICAL DATA:  Post ultrasound-guided right breast mass biopsy and axillary node biopsy  EXAM: DIAGNOSTIC right MAMMOGRAM POST ULTRASOUND BIOPSY  COMPARISON:  Previous exams  FINDINGS: Mammographic images were obtained following ultrasound guided biopsy of right breast mass 10 o'clock location. The coil shaped clip is appropriately located.  IMPRESSION: Satisfactory coil shaped clip location, right breast 10 o'clock biopsied mass.  Final Assessment: Post Procedure Mammograms for Marker Placement   Electronically Signed   By: Conchita Paris M.D.   On: 10/20/2013 11:45   Mm Digital Screening Bilateral  10/06/2013   CLINICAL DATA:  Screening.  EXAM: DIGITAL SCREENING BILATERAL MAMMOGRAM WITH CAD  COMPARISON:  This is the patient's baseline exam  ACR Breast Density Category c: The breast tissue is heterogeneously dense, which may obscure small masses.  FINDINGS: In the left breast a possible  asymmetry warrants further imaging evaluation with spot compression views and possibly ultrasound. In the right breast, a mass warrants further imaging evaluation with spot compression views and possibly ultrasound. Images were processed with CAD.  IMPRESSION: Further imaging evaluation is suggested for a possible asymmetry in the left breast.  Further imaging evaluation is suggested for a mass in the right breast.  RECOMMENDATION: Diagnostic mammogram and possibly ultrasound of both breasts. (Code:[id number]-B-24M)  The patient will be contacted regarding the findings, and additional imaging will be scheduled.  BI-RADS CATEGORY  0: Incomplete. Need additional imaging evaluation and/or prior mammograms for comparison.   Electronically Signed   By: Roena Malady.D.  On: 10/06/2013 16:02   US Breast Ltd Uni Right Inc Axilla  10/20/2013   CLINICAL DATA:  Screening callback from baseline mammogram for right breast mass and possible left breast asymmetry  EXAM: DIGITAL DIAGNOSTIC  bilateral MAMMOGRAM WITH CAD  ULTRASOUND right BREAST  COMPARISON:  Baseline mammogram 10/06/2013  ACR Breast Density Category b: There are scattered areas of fibroglandular density.  FINDINGS: There is a spiculated radiodense mass in the right upper outer quadrant, corresponding to the questioned mammographic finding. An axillary lymph node is identified with possible cortical thickening.  In the left breast, there is no persistent abnormality in the left upper outer quadrant.  Mammographic images were processed with CAD.  On physical exam, I palpate a firm mass right upper outer quadrant.  Ultrasound is performed, showing an irregular hypoechoic shadowing mass right breast 10 o'clock location 5 cm from the nipple measuring 2.1 x 2.1 x 1.9 cm. This corresponds to the palpable and mammographic finding. In the right axilla, there are at least 2 nodes with cortical thickening, one measuring 3 mm in short axis and other measuring 5 mm in short axis  diameter.  IMPRESSION: Suspicious right breast mass 10 o'clock location with abnormal right axillary lymph node cortical thickening. Biopsy of both areas will be performed and dictated separately.  No evidence for malignancy in the left breast.  RECOMMENDATION: Right breast and axilla ultrasound-guided core biopsy  I have discussed the findings and recommendations with the patient. Results were also provided in writing at the conclusion of the visit. If applicable, a reminder letter will be sent to the patient regarding the next appointment.  BI-RADS CATEGORY  4: Suspicious.   Electronically Signed   By: Conchita Paris M.D.   On: 10/20/2013 11:24   [place] Rt Breast Bx W Loc Dev 1st Lesion Img Bx Spec US Guide  10/24/2013   ADDENDUM REPORT: 10/24/2013 08:02  ADDENDUM: Pathology revealed invasive ductal carcinoma in the right breast and metastatic mammary carcinoma in the right axillary lymph node. This was found to be concordant by Dr. Lillia Mountain. Pathology was discussed with the patient and her granddaughter by telephone. The patient reported doing well after the biopsy. Post biopsy instructions were reviewed and her questions were answered. Appointments have been made for the patient at [facility] on November 01, 2013, and for a bilateral breast MRI on October 31, 2013. She was encouraged to come to the [facility] for educational materials. My number was provided to the patient and her granddaughter for future questions and concerns.  Pathology results reported by Susa Raring RN, BSN on October 24, 2013.   Electronically Signed   By: Conchita Paris M.D.   On: 10/24/2013 08:02   10/24/2013   CLINICAL DATA:  Suspicious right breast mass 10 o'clock location and right axillary abnormal lymph nodes. Both areas are biopsied today.  EXAM: ULTRASOUND GUIDED right BREAST CORE NEEDLE BIOPSY and right axillary core biopsy  COMPARISON:  Previous exams.  PROCEDURE:  I met with the patient and we discussed the procedure of ultrasound-guided biopsy, including benefits and alternatives. We discussed the high likelihood of a successful procedure. We discussed the risks of the procedure including infection, bleeding, tissue injury, clip migration, and inadequate sampling. Informed written consent was given. The usual time-out protocol was performed immediately prior to the procedure.  Using sterile technique and 2% Lidocaine as local anesthetic, under direct ultrasound visualization, a 12 gauge vacuum-assisteddevice was used to  perform biopsy of the right breast mass 10 o'clock location using a lateral to medial approach. At the conclusion of the procedure, a coil shaped tissue marker clip was deployed into the biopsy cavity.  Using similar technique and a 14 gauge automated needle, 2 biopsy samples were obtained through the abnormal appearing right axillary lymph node with cortical thickening measuring 3 mm. A clip was not placed at the right axillary node biopsy site.  Follow-up 2-view mammogram was performed and dictated separately.  IMPRESSION: Ultrasound-guided biopsy of right breast mass 10 o'clock location and right axillary lymph node, with coil shaped clip placement at the breast mass biopsy site but not the right axilla. Pathology is pending. No apparent complications.  Electronically Signed: By: Conchita Paris M.D. On: 10/20/2013 11:26   [place] Rt Breast Bx W Loc Dev Ea Add Lesion Img Bx Spec US Guide  10/24/2013   ADDENDUM REPORT: 10/24/2013 08:02  ADDENDUM: Pathology revealed invasive ductal carcinoma in the right breast and metastatic mammary carcinoma in the right axillary lymph node. This was found to be concordant by Dr. Lillia Mountain. Pathology was discussed with the patient and her granddaughter by telephone. The patient reported doing well after the biopsy. Post biopsy instructions were reviewed and her questions were answered. Appointments have been made for the  patient at [facility] on November 01, 2013, and for a bilateral breast MRI on October 31, 2013. She was encouraged to come to the [facility] for educational materials. My number was provided to the patient and her granddaughter for future questions and concerns.  Pathology results reported by Susa Raring RN, BSN on October 24, 2013.   Electronically Signed   By: Conchita Paris M.D.   On: 10/24/2013 08:02   10/24/2013   CLINICAL DATA:  Suspicious right breast mass 10 o'clock location and right axillary abnormal lymph nodes. Both areas are biopsied today.  EXAM: ULTRASOUND GUIDED right BREAST CORE NEEDLE BIOPSY and right axillary core biopsy  COMPARISON:  Previous exams.  PROCEDURE: I met with the patient and we discussed the procedure of ultrasound-guided biopsy, including benefits and alternatives. We discussed the high likelihood of a successful procedure. We discussed the risks of the procedure including infection, bleeding, tissue injury, clip migration, and inadequate sampling. Informed written consent was given. The usual time-out protocol was performed immediately prior to the procedure.  Using sterile technique and 2% Lidocaine as local anesthetic, under direct ultrasound visualization, a 12 gauge vacuum-assisteddevice was used to perform biopsy of the right breast mass 10 o'clock location using a lateral to medial approach. At the conclusion of the procedure, a coil shaped tissue marker clip was deployed into the biopsy cavity.  Using similar technique and a 14 gauge automated needle, 2 biopsy samples were obtained through the abnormal appearing right axillary lymph node with cortical thickening measuring 3 mm. A clip was not placed at the right axillary node biopsy site.  Follow-up 2-view mammogram was performed and dictated separately.  IMPRESSION: Ultrasound-guided biopsy of right breast mass 10 o'clock location and right axillary lymph node,  with coil shaped clip placement at the breast mass biopsy site but not the right axilla. Pathology is pending. No apparent complications.  Electronically Signed: By: Conchita Paris M.D. On: 10/20/2013 11:26      IMPRESSION: T2N1 Invasive Ductal Carcinoma of the right breast  PLAN: I spoke to the patient today regarding her diagnosis and options for treatment. She has elected for  neoadjuvant chemotherapy and will be having a port placed. We discussed the equivalence in terms of survival and local failure between mastectomy and breast conservation. We discussed the role of radiation in decreasing local failures in patients who undergo lumpectomy. She would be eligible for NSABP B51 or the Alliance trial due to her lymph node positivity. We discussed the process of simulation and the placement tattoos. We discussed 4-6 weeks of treatment as an outpatient. We discussed the possibility of asymptomatic lung damage. We discussed the low likelihood of secondary malignancies. We discussed the possible side effects including but not limited to skin redness, fatigue, permanent skin darkening, and breast swelling.  I will she her back after her surgery is complete. She met with medical oncology, surgery and a member of our patient and family support team.  I spent 40 minutes  face to face with the patient and more than 50% of that time was spent in counseling and/or coordination of care.   ------------------------------------------------  [name], MD

## 2013-11-01 NOTE — Progress Notes (Signed)
Checked in new pt with no financial concerns at this time.  I informed pt of the [facility] and what they assist with.  I gave her Raquel's card for any questions or concerns.

## 2013-11-01 NOTE — Progress Notes (Signed)
[facility] Psychosocial Distress Screening Spiritual Care  Visited with Ms Cowman in breast clinic to introduce [facility] resources and [place].  Reviewed distress screen per protocol.  The patient scored a 4 on the Psychosocial Distress Thermometer which indicates mild distress. Assessed for distress and other psychosocial needs. Pt cites faith as a significant tool for coping and meaning-making.  Per pt, the nervousness/anxiety she reported is general, not due to dx.  ONCBCN DISTRESS SCREENING 11/01/2013  Screening Type Initial Screening  Mark the number that describes how much distress you have been experiencing in the past week 4  Emotional problem type Nervousness/Anxiety  Spiritual/Religous concerns type Loss of sense of purpose  Information Concerns Type Lack of info about diagnosis  Referral to support programs Yes    Spiritual Care follow up needed: No.  Pt aware of ongoing chaplain availability, but please also page as needs arise.  Thank you!  [place], [place]

## 2013-11-03 ENCOUNTER — Telehealth: Payer: Self-pay | Admitting: Hematology and Oncology

## 2013-11-03 ENCOUNTER — Other Ambulatory Visit: Payer: Self-pay

## 2013-11-03 NOTE — Telephone Encounter (Signed)
, °

## 2013-11-03 NOTE — Progress Notes (Signed)
Per Dr. Lindi Adie, set patient up for return ofc visit 8/26 at 130 pm.  POF sent

## 2013-11-04 ENCOUNTER — Encounter (HOSPITAL_COMMUNITY): Payer: Self-pay

## 2013-11-04 ENCOUNTER — Ambulatory Visit (HOSPITAL_COMMUNITY)
Admission: RE | Admit: 2013-11-04 | Discharge: 2013-11-04 | Disposition: A | Discharge status: Home / Self Care | Payer: Medicare Other | Source: Ambulatory Visit | Attending: Hematology and Oncology | Admitting: Hematology and Oncology

## 2013-11-04 DIAGNOSIS — E042 Nontoxic multinodular goiter: Secondary | ICD-10-CM | POA: Insufficient documentation

## 2013-11-04 DIAGNOSIS — E279 Disorder of adrenal gland, unspecified: Secondary | ICD-10-CM | POA: Insufficient documentation

## 2013-11-04 DIAGNOSIS — C50419 Malignant neoplasm of upper-outer quadrant of unspecified female breast: Secondary | ICD-10-CM | POA: Insufficient documentation

## 2013-11-04 DIAGNOSIS — R599 Enlarged lymph nodes, unspecified: Secondary | ICD-10-CM | POA: Diagnosis not present

## 2013-11-04 DIAGNOSIS — C50412 Malignant neoplasm of upper-outer quadrant of left female breast: Secondary | ICD-10-CM

## 2013-11-04 LAB — GLUCOSE, CAPILLARY: Glucose-Capillary: 131 mg/dL — ABNORMAL HIGH (ref 70–99)

## 2013-11-04 MED ORDER — FLUDEOXYGLUCOSE F - 18 (FDG) INJECTION: 10.7000 | Freq: Once | INTRAVENOUS | Status: AC | PRN

## 2013-11-06 ENCOUNTER — Other Ambulatory Visit (HOSPITAL_COMMUNITY): Payer: Self-pay | Admitting: Surgery

## 2013-11-08 ENCOUNTER — Other Ambulatory Visit: Payer: Medicare Other

## 2013-11-08 ENCOUNTER — Ambulatory Visit (HOSPITAL_BASED_OUTPATIENT_CLINIC_OR_DEPARTMENT_OTHER): Payer: Medicare Other

## 2013-11-08 ENCOUNTER — Ambulatory Visit (HOSPITAL_COMMUNITY)
Admission: RE | Admit: 2013-11-08 | Discharge: 2013-11-08 | Disposition: A | Discharge status: Home / Self Care | Payer: Medicare Other | Source: Ambulatory Visit | Attending: Hematology and Oncology | Admitting: Hematology and Oncology

## 2013-11-08 ENCOUNTER — Encounter: Payer: Self-pay | Admitting: *Deleted

## 2013-11-08 ENCOUNTER — Ambulatory Visit (HOSPITAL_BASED_OUTPATIENT_CLINIC_OR_DEPARTMENT_OTHER): Payer: Medicare Other | Admitting: Hematology and Oncology

## 2013-11-08 VITALS — BP 159/66 | HR 80 | Temp 98.5°F | Resp 18 | Ht 67.0 in | Wt 202.7 lb

## 2013-11-08 DIAGNOSIS — C773 Secondary and unspecified malignant neoplasm of axilla and upper limb lymph nodes: Secondary | ICD-10-CM

## 2013-11-08 DIAGNOSIS — C50412 Malignant neoplasm of upper-outer quadrant of left female breast: Secondary | ICD-10-CM

## 2013-11-08 DIAGNOSIS — I519 Heart disease, unspecified: Secondary | ICD-10-CM | POA: Diagnosis not present

## 2013-11-08 DIAGNOSIS — I079 Rheumatic tricuspid valve disease, unspecified: Secondary | ICD-10-CM | POA: Insufficient documentation

## 2013-11-08 DIAGNOSIS — Z170 Estrogen receptor positive status [ER+]: Secondary | ICD-10-CM

## 2013-11-08 DIAGNOSIS — E278 Other specified disorders of adrenal gland: Secondary | ICD-10-CM | POA: Insufficient documentation

## 2013-11-08 DIAGNOSIS — E041 Nontoxic single thyroid nodule: Secondary | ICD-10-CM

## 2013-11-08 DIAGNOSIS — C50919 Malignant neoplasm of unspecified site of unspecified female breast: Secondary | ICD-10-CM | POA: Diagnosis present

## 2013-11-08 DIAGNOSIS — C50419 Malignant neoplasm of upper-outer quadrant of unspecified female breast: Secondary | ICD-10-CM

## 2013-11-08 DIAGNOSIS — E042 Nontoxic multinodular goiter: Secondary | ICD-10-CM

## 2013-11-08 DIAGNOSIS — I369 Nonrheumatic tricuspid valve disorder, unspecified: Secondary | ICD-10-CM

## 2013-11-08 DIAGNOSIS — E279 Disorder of adrenal gland, unspecified: Secondary | ICD-10-CM

## 2013-11-08 NOTE — Progress Notes (Signed)
Patient Care Team: Whitney Frizzle, MD as PCP - General (Family Medicine) Whitney Eisenmenger, MD as Consulting Physician (Hematology and Oncology) Whitney Faster. Cornett, MD as Consulting Physician (General Surgery) Whitney Silversmith, MD as Consulting Physician (Radiation Oncology)  DIAGNOSIS: Breast cancer of upper-outer quadrant of left female breast   Primary site: Breast (Right)   Staging method: AJCC 7th Edition   Clinical: Stage IIB (T2, N1, cM0) signed by Whitney Eisenmenger, MD on 11/01/2013  8:37 AM   Summary: Stage IIB (T2, N1, cM0)   SUMMARY OF ONCOLOGIC HISTORY:   Breast cancer of upper-outer quadrant of left female breast   10/20/2013 Mammogram Ultrasound and mammogram showed 2.1 x 2.1 x 1.9 cm right breast mass   10/20/2013 Initial Diagnosis Breast cancer of upper-outer quadrant of left female breast. Invasive ductal cancer with lymphovascular invasion one lymph node biopsy that was positive for cancer grade 1   10/31/2013 Breast MRI Right breast upper outer quadrant: 3.1 x 1.9 x 2.2 cm: Right axillary lymph node 1.1 x 0.9 x 0.6 cm    Receptors her2     CHIEF COMPLIANT:  Followup after undergoing testing for breast cancer INTERVAL HISTORY:  Whitney [name] is an 80 year old Caucasian lady with above-mentioned history of node positive right breast cancer that was ER/PR positive HER-2 negative. In the multidisciplinary breast clinic we decided that she should get neoadjuvant chemotherapy. She underwent a PET/CT scan for staging along with echocardiogram of the heart and is here today to discuss those results. She denies any new complaints or concerns. She also underwent chemotherapy education. She is here to sign consent and to get started on chemotherapy as well as a port is in place.  REVIEW OF SYSTEMS:   Constitutional: Denies fevers, chills or abnormal weight loss Eyes: Denies blurriness of vision Ears, nose, mouth, throat, and face: Denies mucositis or sore throat Respiratory: Denies cough,  dyspnea or wheezes Cardiovascular: Denies palpitation, chest discomfort or lower extremity swelling Gastrointestinal:  Denies nausea, heartburn or change in bowel habits Skin: Denies abnormal skin rashes Lymphatics: Denies new lymphadenopathy or easy bruising Neurological:Denies numbness, tingling or new weaknesses Behavioral/Psych: Mood is stable, no new changes  Breast: denies any pain or lumps or nodules in either breasts All other systems were reviewed with the patient and are negative.  I have reviewed the past medical history, past surgical history, social history and family history with the patient and they are unchanged from previous note.  ALLERGIES:  has No Known Allergies.  MEDICATIONS:  Current Outpatient Prescriptions  Medication Sig Dispense Refill  . amLODipine (NORVASC) 5 MG tablet TAKE 1 TABLET (5 MG TOTAL) BY MOUTH DAILY.  90 tablet  0  . aspirin 81 MG tablet Take 81 mg by mouth daily.      Marland Kitchen atorvastatin (LIPITOR) 10 MG tablet TAKE 1 TABLET (10 MG TOTAL) BY MOUTH DAILY.  90 tablet  0  . [employer] VITAMIN D 2000 UNITS CAPS TAKE 1 CAPSULE BY MOUTH DAILY FOR 90 DAYS  100 capsule  3  . ferrous sulfate 325 (65 FE) MG EC tablet Take 325 mg by mouth daily with breakfast.      . furosemide (LASIX) 40 MG tablet Take 1-2 tablets daily as needed  60 tablet  5  . glucose blood test strip 1 each by Other route as needed for other (One Touch Ultra). Use as instructed      . levothyroxine (SYNTHROID, LEVOTHROID) 50 MCG tablet Take 1 tablet (50 mcg total) by  mouth daily before breakfast.  90 tablet  0  . lisinopril-hydrochlorothiazide (PRINZIDE,ZESTORETIC) 20-12.5 MG per tablet TAKE 1 TABLET BY MOUTH DAILY.  90 tablet  0  . metFORMIN (GLUCOPHAGE-XR) 500 MG 24 hr tablet TAKE 1 TABLET BY MOUTH IN THE MORNING AND 2 AT NIGHT      . metoprolol succinate (TOPROL-XL) 25 MG 24 hr tablet Take 1 tablet (25 mg total) by mouth daily.  90 tablet  0  . pioglitazone (ACTOS) 30 MG tablet Take 1 tablet (30  mg total) by mouth daily.  90 tablet  0  . potassium chloride SA (KLOR-CON M20) 20 MEQ tablet TAKE 1 TABLET (20 MEQ TOTAL) BY MOUTH DAILY.  90 tablet  0  . vitamin B-12 (CYANOCOBALAMIN) 1000 MCG tablet Take 1,000 mcg by mouth daily.       No current facility-administered medications for this visit.    PHYSICAL EXAMINATION: ECOG PERFORMANCE STATUS: 0 - Asymptomatic  Filed Vitals:   11/08/13 1302  BP: 159/66  Pulse: 80  Temp: 98.5 F (36.9 C)  Resp: 18   Filed Weights   11/08/13 1302  Weight: 202 lb 11.2 oz (91.944 kg)    GENERAL:alert, no distress and comfortable SKIN: skin color, texture, turgor are normal, no rashes or significant lesions EYES: normal, Conjunctiva are pink and non-injected, sclera clear OROPHARYNX:no exudate, no erythema and lips, buccal mucosa, and tongue normal  NECK: supple, thyroid normal size, non-tender, without nodularity LYMPH:  no palpable lymphadenopathy in the cervical, axillary or inguinal LUNGS: clear to auscultation and percussion with normal breathing effort HEART: regular rate & rhythm and no murmurs and no lower extremity edema ABDOMEN:abdomen soft, non-tender and normal bowel sounds Musculoskeletal:no cyanosis of digits and no clubbing  NEURO: alert & oriented x 3 with fluent speech, no focal motor/sensory deficits   LABORATORY DATA:  I have reviewed the data as listed Hospital Outpatient Visit on 11/04/2013  Component Date Value Ref Range Status  . Glucose-Capillary 11/04/2013 131* 70 - 99 mg/dL Final  Appointment on 11/01/2013  Component Date Value Ref Range Status  . WBC 11/01/2013 6.3  3.9 - 10.3 10e3/uL Final  . NEUT# 11/01/2013 4.7  1.5 - 6.5 10e3/uL Final  . HGB 11/01/2013 11.4* 11.6 - 15.9 g/dL Final  . HCT 11/01/2013 35.4  34.8 - 46.6 % Final  . Platelets 11/01/2013 271  145 - 400 10e3/uL Final  . MCV 11/01/2013 87.6  79.5 - 101.0 fL Final  . MCH 11/01/2013 28.2  25.1 - 34.0 pg Final  . MCHC 11/01/2013 32.2  31.5 - 36.0  g/dL Final  . RBC 11/01/2013 4.04  3.70 - 5.45 10e6/uL Final  . RDW 11/01/2013 15.8* 11.2 - 14.5 % Final  . lymph# 11/01/2013 1.0  0.9 - 3.3 10e3/uL Final  . MONO# 11/01/2013 0.5  0.1 - 0.9 10e3/uL Final  . Eosinophils Absolute 11/01/2013 0.1  0.0 - 0.5 10e3/uL Final  . Basophils Absolute 11/01/2013 0.1  0.0 - 0.1 10e3/uL Final  . NEUT% 11/01/2013 74.0  38.4 - 76.8 % Final  . LYMPH% 11/01/2013 15.1  14.0 - 49.7 % Final  . MONO% 11/01/2013 8.5  0.0 - 14.0 % Final  . EOS% 11/01/2013 1.5  0.0 - 7.0 % Final  . BASO% 11/01/2013 0.9  0.0 - 2.0 % Final  . Sodium 11/01/2013 141  136 - 145 mEq/L Final  . Potassium 11/01/2013 4.0  3.5 - 5.1 mEq/L Final  . Chloride 11/01/2013 104  98 - 109 mEq/L Final  .  CO2 11/01/2013 27  22 - 29 mEq/L Final  . Glucose 11/01/2013 135  70 - 140 mg/dl Final  . BUN 11/01/2013 17.5  7.0 - 26.0 mg/dL Final  . Creatinine 11/01/2013 0.9  0.6 - 1.1 mg/dL Final  . Total Bilirubin 11/01/2013 0.33  0.20 - 1.20 mg/dL Final  . Alkaline Phosphatase 11/01/2013 62  40 - 150 U/L Final  . AST 11/01/2013 15  5 - 34 U/L Final  . ALT 11/01/2013 7  0 - 55 U/L Final  . Total Protein 11/01/2013 6.9  6.4 - 8.3 g/dL Final  . Albumin 11/01/2013 3.6  3.5 - 5.0 g/dL Final  . Calcium 11/01/2013 10.3  8.4 - 10.4 mg/dL Final  . Anion Gap 11/01/2013 10  3 - 11 mEq/L Final    RADIOGRAPHIC STUDIES: I have personally reviewed the radiology reports and agreed with their findings. No results found.   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 We will do restaging MRI of the breast and refer her to Gen. surgery at that time for lumpectomy. I previously discussed the risks and benefits of chemotherapy and centered in chemotherapy  class. She understands these risks and benefits and has consented to proceed with treatment.  PET/CT scan done reveals hypermetabolic breast lesion with an axillary lymph node date but incidentally there were adrenal nodules and bilateral thyroid nodules that were also hypermetabolic. Otherwise no evidence of distant metastatic disease. I will request port placement and we will plan to start chemotherapy on September 3.  I have discussed the risks and benefits of chemotherapy including the risks of nausea/ vomiting, risk of infection from low WBC count, fatigue due to chemo or anemia, bruising or bleeding due to low platelets, mouth sores, loss/ change in taste and decreased appetite. Liver and kidney function will be monitored through out chemotherapy as abnormalities in liver and kidney function may be a side effect of treatment.  Cardiac dysfunction due to Adriamycin was discussed in detail. Her echocardiogram revealed an EF of 65-70% Risk of permanent bone marrow dysfunction and leukemia due to chemo were also discussed.   Multinodular goiter Multinodular goiter that is very hot on PET CT scan I reviewed the PET scan with the patient I will obtain free T3 and T4 and TSH and refer her to see endocrinology to decide whether she needs to be biopsied or additional testing may need to be done.  Adrenal nodule Hypermetabolic adrenal nodules: These were incidentally found a PET/CT scan. This could be adrenal adenomas. Xylocaine cortisol level serum catecholamines and aldosterone levels to assess if this is a functioning nodule. I will make a referral to endocrinology to assist in the management of this adrenal nodule.   Orders Placed This Encounter  Procedures  . T4, free    Standing Status: Future     Number of Occurrences:      Standing Expiration Date: 11/08/2014  . T3, free    Standing Status: Future     Number of Occurrences:      Standing Expiration Date: 11/08/2014  . TSH    Standing  Status: Future     Number of Occurrences:      Standing Expiration Date: 11/08/2014  . Cortisol    Standing Status: Future  Number of Occurrences:      Standing Expiration Date: 11/08/2014  . Catecholamines, fractionated, urine, 24 hour    Standing Status: Future     Number of Occurrences:      Standing Expiration Date: 11/08/2014  . Aldosterone    Standing Status: Future     Number of Occurrences:      Standing Expiration Date: 11/08/2014    Order Specific Question:  Patient Supine or Upright?    Answer:  UPRIGHT   The patient has a good understanding of the overall plan. she agrees with it. She will call with any problems that may develop before her next visit here.  I spent 30 minutes counseling the patient face to face. The total time spent in the appointment was 40 minutes and more than 50% was on counseling and review of test results    Whitney Eisenmenger, MD 11/08/2013 1:56 PM

## 2013-11-08 NOTE — Assessment & Plan Note (Signed)
Hypermetabolic adrenal nodules: These were incidentally found a PET/CT scan. This could be adrenal adenomas. Xylocaine cortisol level serum catecholamines and aldosterone levels to assess if this is a functioning nodule. I will make a referral to endocrinology to assist in the management of this adrenal nodule.

## 2013-11-08 NOTE — Progress Notes (Signed)
*  PRELIMINARY RESULTS* Echocardiogram 2D Echocardiogram has been performed.  Leavy Cella 11/08/2013, 9:35 AM

## 2013-11-08 NOTE — Assessment & Plan Note (Signed)
Stage IIB left breast invasive ductal carcinoma ER/PR positive HER-2 negative: After discussing with general surgery the plan is to do neoadjuvant chemotherapy with Adriamycin Cytoxan in a dose dense fashion followed by weekly Taxol. We will do restaging MRI of the breast and refer her to Gen. surgery at that time for lumpectomy. I previously discussed the risks and benefits of chemotherapy and centered in chemotherapy class. She understands these risks and benefits and has consented to proceed with treatment.  PET/CT scan done reveals hypermetabolic breast lesion with an axillary lymph node date but incidentally there were adrenal nodules and bilateral thyroid nodules that were also hypermetabolic. Otherwise no evidence of distant metastatic disease. I will request port placement and we will plan to start chemotherapy on September 3.  I have discussed the risks and benefits of chemotherapy including the risks of nausea/ vomiting, risk of infection from low WBC count, fatigue due to chemo or anemia, bruising or bleeding due to low platelets, mouth sores, loss/ change in taste and decreased appetite. Liver and kidney function will be monitored through out chemotherapy as abnormalities in liver and kidney function may be a side effect of treatment.  Cardiac dysfunction due to Adriamycin was discussed in detail. Her echocardiogram revealed an EF of 65-70% Risk of permanent bone marrow dysfunction and leukemia due to chemo were also discussed.

## 2013-11-08 NOTE — Assessment & Plan Note (Signed)
Multinodular goiter that is very hot on PET CT scan I reviewed the PET scan with the patient I will obtain free T3 and T4 and TSH and refer her to see endocrinology to decide whether she needs to be biopsied or additional testing may need to be done.

## 2013-11-09 ENCOUNTER — Other Ambulatory Visit: Payer: Self-pay

## 2013-11-09 ENCOUNTER — Telehealth: Payer: Self-pay | Admitting: *Deleted

## 2013-11-09 ENCOUNTER — Telehealth: Payer: Self-pay | Admitting: Hematology and Oncology

## 2013-11-09 ENCOUNTER — Telehealth: Payer: Self-pay

## 2013-11-09 LAB — TSH CHCC: TSH: 0.322 m(IU)/L (ref 0.308–3.960)

## 2013-11-09 NOTE — Telephone Encounter (Signed)
Spoke to pt concerning [place] from 11/01/13. Pt denies questions or concerns regarding dx or treatment care plan. Confirmed pre-op testing and port placement. Encourage pt to call with needs or concerns. Received verbal understanding. Contact information given.

## 2013-11-09 NOTE — Telephone Encounter (Signed)
Per staff message and POF I have scheduled appts. Advised scheduler of appts. JMW  

## 2013-11-09 NOTE — Telephone Encounter (Signed)
Per Dr Lindi Adie, pt needs to see endocrinologist for review of PET.  Appt with Dr. Dwyane Dee on 12/15/13 moved up with Jessie Foot RN) to 11/14/13 at 845 am.  Let pt know of appt d/t.  Pt voiced understanding and concern at number of appts and transportation.  Mailed a note with appointment date/time to patient.

## 2013-11-09 NOTE — Telephone Encounter (Signed)
, °

## 2013-11-11 LAB — T3, FREE: T3, Free: 2.8 pg/mL (ref 2.3–4.2)

## 2013-11-11 LAB — T4, FREE: Free T4: 1.42 ng/dL (ref 0.80–1.80)

## 2013-11-11 LAB — ALDOSTERONE: Aldosterone, Serum: <1 ng/dL

## 2013-11-11 LAB — CORTISOL: Cortisol, Plasma: 13.4 ug/dL

## 2013-11-13 ENCOUNTER — Encounter (HOSPITAL_COMMUNITY): Payer: Self-pay

## 2013-11-13 ENCOUNTER — Ambulatory Visit (HOSPITAL_COMMUNITY)
Admission: RE | Admit: 2013-11-13 | Discharge: 2013-11-13 | Disposition: A | Discharge status: Home / Self Care | Payer: Medicare Other | Source: Ambulatory Visit | Attending: Surgery | Admitting: Surgery

## 2013-11-13 ENCOUNTER — Encounter (HOSPITAL_COMMUNITY)
Admission: RE | Admit: 2013-11-13 | Discharge: 2013-11-13 | Disposition: A | Discharge status: Home / Self Care | Payer: Medicare Other | Source: Ambulatory Visit | Attending: Surgery | Admitting: Surgery

## 2013-11-13 DIAGNOSIS — C50919 Malignant neoplasm of unspecified site of unspecified female breast: Secondary | ICD-10-CM | POA: Diagnosis present

## 2013-11-13 HISTORY — DX: Malignant (primary) neoplasm, unspecified: C80.1

## 2013-11-13 HISTORY — DX: Adverse effect of unspecified anesthetic, initial encounter: T41.45XA

## 2013-11-13 HISTORY — DX: Other complications of anesthesia, initial encounter: T88.59XA

## 2013-11-13 LAB — CBC WITH DIFFERENTIAL/PLATELET
Basophils Absolute: 0 10*3/uL (ref 0.0–0.1)
Basophils Relative: 0 % (ref 0–1)
Eosinophils Absolute: 0.1 10*3/uL (ref 0.0–0.7)
Eosinophils Relative: 1 % (ref 0–5)
HCT: 33.9 % — ABNORMAL LOW (ref 36.0–46.0)
Hemoglobin: 11.3 g/dL — ABNORMAL LOW (ref 12.0–15.0)
Lymphocytes Relative: 18 % (ref 12–46)
Lymphs Abs: 1.2 10*3/uL (ref 0.7–4.0)
MCH: 28.8 pg (ref 26.0–34.0)
MCHC: 33.3 g/dL (ref 30.0–36.0)
MCV: 86.3 fL (ref 78.0–100.0)
Monocytes Absolute: 0.6 10*3/uL (ref 0.1–1.0)
Monocytes Relative: 8 % (ref 3–12)
Neutro Abs: 4.9 10*3/uL (ref 1.7–7.7)
Neutrophils Relative %: 73 % (ref 43–77)
Platelets: 247 10*3/uL (ref 150–400)
RBC: 3.93 MIL/uL (ref 3.87–5.11)
RDW: 15.9 % — ABNORMAL HIGH (ref 11.5–15.5)
WBC: 6.8 10*3/uL (ref 4.0–10.5)

## 2013-11-13 LAB — CATECHOLAMINES, FRACTIONATED, URINE, 24 HOUR
Calculated Total (E+NE): 73 mcg/24 h (ref 26–121)
Creatinine, Urine mg/day-CATEUR: 0.38 g/(24.h) — ABNORMAL LOW (ref 0.63–2.50)
Dopamine, 24 hr Urine: 224 mcg/24 h (ref 52–480)
Norepinephrine, 24 hr Ur: 73 mcg/24 h (ref 15–100)
Total Volume - CF 24Hr U: 1800 mL

## 2013-11-13 LAB — COMPREHENSIVE METABOLIC PANEL
ALT: 9 U/L (ref 0–35)
AST: 14 U/L (ref 0–37)
Albumin: 3.4 g/dL — ABNORMAL LOW (ref 3.5–5.2)
Alkaline Phosphatase: 61 U/L (ref 39–117)
Anion gap: 12 (ref 5–15)
BUN: 27 mg/dL — ABNORMAL HIGH (ref 6–23)
CO2: 28 mEq/L (ref 19–32)
Calcium: 10.2 mg/dL (ref 8.4–10.5)
Chloride: 100 mEq/L (ref 96–112)
Creatinine, Ser: 1.34 mg/dL — ABNORMAL HIGH (ref 0.50–1.10)
GFR calc Af Amer: 41 mL/min — ABNORMAL LOW (ref >90)
GFR calc non Af Amer: 36 mL/min — ABNORMAL LOW (ref >90)
Glucose, Bld: 132 mg/dL — ABNORMAL HIGH (ref 70–99)
Potassium: 3.8 mEq/L (ref 3.7–5.3)
Sodium: 140 mEq/L (ref 137–147)
Total Bilirubin: 0.3 mg/dL (ref 0.3–1.2)
Total Protein: 6.8 g/dL (ref 6.0–8.3)

## 2013-11-13 NOTE — Progress Notes (Addendum)
Anesthesia Chart Review: Patient is a 80 year old female scheduled for Port placement with ultrasound on 11/15/13 by Dr. Brantley Stage. She was recently diagnosed with right breast cancer and needs to start chemotherapy.  History includes right breast cancer (clinical stage IIB, 10/2013), former smoker, HTN, HLD, DM2, anemia, hypothyroidism, multinodular goiter, hysterectomy, slow to wake up from anesthesia. PCP is Dr. Jenna Luo, HEM-ONC is Dr. Nicholas Lose. RAD-ONC Dr. [name]. She is scheduled to see endocrinologist Dr. Dwyane Dee on 11/14/13 for recommendations re: incidental finding of hypermetabolic nodules in both thyroid lobes on 11/04/13.     Meds: Vitamin D, ASA, Lipitor, ferrous sulfate, levothyroxine, Zestoretic, metformin, Toprol, Actos, KCL, Vitamin B-12, Lasix PRN.   EKG on 11/13/13 showed: NSR.  Echo on 11/08/13 (pre-chemo) showed:  - Left ventricle: The cavity size was normal. Systolic function was vigorous. The estimated ejection fraction was in the range of 65% to 70%. Wall motion was normal; there were no regional wall motion abnormalities. Features are consistent with a pseudonormal left ventricular filling pattern, with concomitant abnormal relaxation and increased filling pressure (grade 2 diastolic dysfunction). - Aortic valve: Trileaflet; normal thickness leaflets. There was mild regurgitation. - Aortic root: The aortic root was normal in size. - Mitral valve: Calcified annulus. There was mild regurgitation. - Left atrium: The atrium was severely dilated. - Right ventricle: Systolic function was normal. - Right atrium: The atrium was moderately dilated. - Tricuspid valve: There was moderate regurgitation. - Pulmonic valve: There was no regurgitation. - Pulmonary arteries: Systolic pressure was within the normal range. - Pericardium, extracardiac: There was no pericardial effusion. Impressions: Normal biventricular size and systolic function. Pseudonormal pattern of diastolic  dysfunction with elevated filling pressures. Bi-atrial enlargement. Mild mitral and moderate tricuspid regurgitation.  PET scan on 11/04/13 showed:  - 2 cm primary breast carcinoma in upper outer quadrant of the right breast. A single sub-cm right axillary lymph node shows metabolic activity, suspicious for lymph node metastasis although this could be reactive in etiology secondary to recent breast biopsy.  - 3.7 cm hypermetabolic left adrenal mass, suspicious for metastasis although some adrenal adenomas can also be hypermetabolic. Consider CT-guided needle biopsy for tissue diagnosis.  - Multinodular goiter with bilateral hypermetabolic thyroid nodules. Primary thyroid carcinoma cannot be excluded. This follows ACR consensus guidelines: Managing Incidental Thyroid Nodules Detected on Imaging: White Paper of the ACR Incidental Thyroid Findings Committee. J Am Coll Radiol 2015; 12:143-150.  - Cholelithiasis and diverticulosis incidentally noted.  CXR 11/13/2013 showed: No active cardiopulmonary disease.  Preoperative labs noted.  BUN/Cr 27/1.34, up from 17.5/0.9 on 11/01/13. Glucose 132.  H/H 11.3/33.9. Plasma cortisol on 11/08/13 was 13.4, TSH 0.322, Free T4 1.42, Free T3 2.8.  She's had an increase in her Cr over the past two weeks.  She is on metformin, lisinopril-HCTZ, and furosemide PRN.  May need to check ISTAT8 preoperatively to evaluate for any significant increase in her renal function versus close out-patient follow-up. She has an appointment with Dr. Dwyane Dee tomorrow, so I'll await his recommendations.    George Hugh [facility] Short Stay Center/Anesthesiology Phone [phone] 11/13/2013 6:28 PM  Addendum: 11/14/2013 12:52 AM I reviewed Dr. Ronnie Derby note from this morning.  Urinary catecholamines done last week were normal although her collection appeared to be inadequate. His note mentions rechecking her renal function today, but I think this was ultimately deferred since she had labs  done yesterday. She has a lab appointment scheduled for 11/16/13 at [facility].  Yesterday's labs are marked as  reviewed by his oncologists and Dr. Brantley Stage.  Since she is scheduled to labs on 11/16/13, I will defer any repeat same day labs (ie, ISTAT8) to her anesthesiologist.

## 2013-11-13 NOTE — Pre-Procedure Instructions (Signed)
Whitney [name]  11/13/2013   Your procedure is scheduled on:  Wednesday September 2 nd at 0830 AN  Report to [facility] Admitting at [place] AM.  Call this number if you have problems the morning of surgery: [phone]   Remember:   Do not eat food or drink liquids after midnight Tuesday   Take these medicines the morning of surgery with A SIP OF WATER: Norvasc, Synthroid,and Toprol-XL. No diabetic oral medication nor Insulin day of surgery.  Stop Aspirin, Vitamins, Herbal medication, or Nsaids now.  Do not wear jewelry, make-up or nail polish.  Do not wear lotions, powders, or perfumes. You may wear deodorant.  Do not shave 48 hours prior to surgery.   Do not bring valuables to the hospital.  [facility] is not responsible  for any belongings or valuables.               Contacts, dentures or bridgework may not be worn into surgery.  Leave suitcase in the car. After surgery it may be brought to your room.  For patients admitted to the hospital, discharge time is determined by your  treatment team.               Patients discharged the day of surgery will not be allowed to drive home.    Special Instructions: [place] - Preparing for Surgery  Before surgery, you can play an important role.  Because skin is not sterile, your skin needs to be as free of germs as possible.  You can reduce the number of germs on you skin by washing with CHG (chlorahexidine gluconate) soap before surgery.  CHG is an antiseptic cleaner which kills germs and bonds with the skin to continue killing germs even after washing.  Please DO NOT use if you have an allergy to CHG or antibacterial soaps.  If your skin becomes reddened/irritated stop using the CHG and inform your nurse when you arrive at Short Stay.  Do not shave (including legs and underarms) for at least 48 hours prior to the first CHG shower.  You may shave your face.  Please follow these instructions carefully:   1.  Shower with  CHG Soap the night before surgery and the                                morning of Surgery.  2.  If you choose to wash your hair, wash your hair first as usual with your       normal shampoo.  3.  After you shampoo, rinse your hair and body thoroughly to remove the                      Shampoo.  4.  Use CHG as you would any other liquid soap.  You can apply chg directly       to the skin and wash gently with scrungie or a clean washcloth.  5.  Apply the CHG Soap to your body ONLY FROM THE NECK DOWN.        Do not use on open wounds or open sores.  Avoid contact with your eyes,       ears, mouth and genitals (private parts).  Wash genitals (private parts)       with your normal soap.  6.  Wash thoroughly, paying special attention to the area where your surgery  will be performed.  7.  Thoroughly rinse your body with warm water from the neck down.  8.  DO NOT shower/wash with your normal soap after using and rinsing off       the CHG Soap.  9.  Pat yourself dry with a clean towel.            10.  Wear clean pajamas.            11.  Place clean sheets on your bed the night of your first shower and do not        sleep with pets.  Day of Surgery  Do not apply any lotions/deoderants the morning of surgery.  Please wear clean clothes to the hospital/surgery center.      Please read over the following fact sheets that you were given: Pain Booklet, Coughing and Deep Breathing and Surgical Site Infection Prevention

## 2013-11-14 ENCOUNTER — Encounter (HOSPITAL_COMMUNITY): Payer: Self-pay | Admitting: Certified Registered Nurse Anesthetist

## 2013-11-14 ENCOUNTER — Ambulatory Visit (INDEPENDENT_AMBULATORY_CARE_PROVIDER_SITE_OTHER): Payer: Medicare Other | Admitting: Endocrinology

## 2013-11-14 ENCOUNTER — Encounter: Payer: Self-pay | Admitting: Endocrinology

## 2013-11-14 ENCOUNTER — Other Ambulatory Visit: Payer: Self-pay | Admitting: Hematology and Oncology

## 2013-11-14 VITALS — BP 124/60 | HR 80 | Temp 98.2°F | Resp 14 | Ht 67.0 in | Wt 202.6 lb

## 2013-11-14 DIAGNOSIS — E278 Other specified disorders of adrenal gland: Secondary | ICD-10-CM

## 2013-11-14 DIAGNOSIS — I10 Essential (primary) hypertension: Secondary | ICD-10-CM

## 2013-11-14 DIAGNOSIS — E279 Disorder of adrenal gland, unspecified: Secondary | ICD-10-CM

## 2013-11-14 DIAGNOSIS — C50412 Malignant neoplasm of upper-outer quadrant of left female breast: Secondary | ICD-10-CM

## 2013-11-14 DIAGNOSIS — E042 Nontoxic multinodular goiter: Secondary | ICD-10-CM

## 2013-11-14 DIAGNOSIS — E119 Type 2 diabetes mellitus without complications: Secondary | ICD-10-CM

## 2013-11-14 MED ORDER — DEXAMETHASONE 4 MG PO TABS: ORAL_TABLET | ORAL | Status: DC

## 2013-11-14 MED ORDER — CEFAZOLIN SODIUM-DEXTROSE 2-3 GM-% IV SOLR
2.0000 g | INTRAVENOUS | Status: AC
  Administered 2013-11-15: 2 g via INTRAVENOUS
  Filled 2013-11-14: qty 50

## 2013-11-14 MED ORDER — LORAZEPAM 0.5 MG PO TABS: 0.5000 mg | ORAL_TABLET | Freq: Four times a day (QID) | ORAL | Status: DC | PRN

## 2013-11-14 MED ORDER — PROCHLORPERAZINE MALEATE 10 MG PO TABS: 10.0000 mg | ORAL_TABLET | Freq: Four times a day (QID) | ORAL | Status: DC | PRN

## 2013-11-14 MED ORDER — ONDANSETRON HCL 8 MG PO TABS: 8.0000 mg | ORAL_TABLET | Freq: Two times a day (BID) | ORAL | Status: DC | PRN

## 2013-11-14 MED ORDER — DEXAMETHASONE 1 MG PO TABS: ORAL_TABLET | ORAL | Status: DC

## 2013-11-14 NOTE — Patient Instructions (Addendum)
Reduce Lisinopril to 1/2 and also thyroid pills in 1/2  Potassium pill just on days you take Lasix  Dexamethasone is night before lab

## 2013-11-14 NOTE — Progress Notes (Addendum)
Patient ID: Whitney [name], female   DOB: 12-Mar-1928, 80 y.o.   MRN: [id number]   Reason for Appointment: Adrenal mass and other issues  History of Present Illness   ADRENAL mass:  She was evaluated by a PET scan after recent diagnosis of breast cancer She was found to have a 3.7 cm hypermetabolic left adrenal mass and is not being referred here for further evaluation She has been clinically asymptomatic and has had no fatigue, lightheadedness, palpitations, headaches, flushing, significant weight change or change in blood pressure Review of her records indicate that she had a 3.8 cm adrenal mass on the CT scan in 2011 also. This was not evaluated further but was described as heterogenous and indeterminate  She has had baseline biochemical evaluation from her oncologist including random cortisol and aldosterone levels done in the afternoon which were normal Also has had urinary catecholamines done which were in the normal range although her collection appeared to be inadequate with low urinary creatinine level No problems with hypokalemia recently and her potassium is quite normal with her supplement which she is taking with her lisinopril HCT  Type 2 DIABETES MELITUS, date of diagnosis:  1983     Previous history: She has been taking metformin and Actos for several years. She usually has had excellent control with upper normal A1c  Recent history: Her metformin was reduced from her dose of 2000 mg a day to 1500 mg because of her age.  Is tolerating this and also Actos without side effects. Her blood sugars are overall well controlled by recall but has not had an A1c recently Did not bring her monitor for download today Not able to do much exercise, does walk indoors mostly       Monitors blood glucose: Once a day.    Glucometer: One Touch.          Blood Glucose readings from recall: about 120-130  Hypoglycemia frequency:  none        Meals: 3 meals per day.          Physical  activity: exercise: Only walking around the house           Dietician visit: Most recent:?         Complications: are: None   Wt Readings from Last 3 Encounters:  11/14/13 202 lb 9.6 oz (91.899 kg)  11/13/13 203 lb 3.2 oz (92.171 kg)  11/08/13 202 lb 11.2 oz (91.944 kg)   Eye exams: last in 4/14  LABS:  Lab Results  Component Value Date   HGBA1C 6.1* 07/27/2013   HGBA1C 6.0 04/07/2013   HGBA1C 6.0 11/24/2012   Lab Results  Component Value Date   MICROALBUR 10.1* 08/09/2013   LDLCALC 66 04/07/2013   CREATININE 1.34* 11/13/2013         Medication List       This list is accurate as of: 11/14/13  8:55 AM.  Always use your most recent med list.               aspirin 81 MG tablet  Take 81 mg by mouth daily.     atorvastatin 10 MG tablet  Commonly known as:  LIPITOR  Take 10 mg by mouth daily.     ferrous sulfate 325 (65 FE) MG EC tablet  Take 325 mg by mouth daily with breakfast.     furosemide 40 MG tablet  Commonly known as:  LASIX  Take 1-2 tablets daily as needed  glucose blood test strip  1 each by Other route as needed for other (One Touch Ultra). Use as instructed     levothyroxine 50 MCG tablet  Commonly known as:  SYNTHROID, LEVOTHROID  Take 1 tablet (50 mcg total) by mouth daily before breakfast.     lisinopril-hydrochlorothiazide 20-12.5 MG per tablet  Commonly known as:  PRINZIDE,ZESTORETIC  Take 1 tablet by mouth daily.     metFORMIN 500 MG 24 hr tablet  Commonly known as:  GLUCOPHAGE-XR  Take 500-1,000 mg by mouth 2 (two) times daily. Take 500 mg every morning and 1000 mg every evening     metoprolol succinate 25 MG 24 hr tablet  Commonly known as:  TOPROL-XL  Take 1 tablet (25 mg total) by mouth daily.     pioglitazone 30 MG tablet  Commonly known as:  ACTOS  Take 1 tablet (30 mg total) by mouth daily.     potassium chloride SA 20 MEQ tablet  Commonly known as:  K-DUR,KLOR-CON  Take 20 mEq by mouth daily.     SYSTANE OP  Place 1  drop into both eyes 3 (three) times daily.     vitamin B-12 1000 MCG tablet  Commonly known as:  CYANOCOBALAMIN  Take 1,000 mcg by mouth daily.     Vitamin D 2000 UNITS Caps  Take 2,000 Units by mouth daily.        Allergies: No Known Allergies  Past Medical History  Diagnosis Date  . Hypertension   . Hyperlipidemia   . Diabetes mellitus without complication   . Thyroid disease     hypothyroidism  . Anemia   . Former smoker   . Multinodular goiter   . Complication of anesthesia     slow to wake up  . Cancer     right breast    Past Surgical History  Procedure Laterality Date  . Eye surgery  12/22/2009  . Abdominal hysterectomy      No family history on file.  Social History:  reports that she quit smoking about 55 years ago. She has never used smokeless tobacco. She reports that she does not drink alcohol or use illicit drugs.  Review of Systems:  Hypertension: She has had good control with her lisinopril HCTZ   Also the microalbumin/creatinine has been historically normal  She was taking Lasix for previous history of edema and is taking this 2-3 days a week.  Does not think she has significant edema   Renal function: Creatinine is higher than usual, has had recent contrast dye for MRI  Lab Results  Component Value Date   CREATININE 1.34* 11/13/2013    Lipids: Has been on long-term Lipitor with good control    Lab Results  Component Value Date   CHOL 156 04/07/2013   HDL 78.80 04/07/2013   LDLCALC 66 04/07/2013   TRIG 54.0 04/07/2013   CHOLHDL 2 04/07/2013     Hypokalemia: Her potassium was low previously and she is taking potassium supplement daily  She has had vitamin D deficiency, her level was 35 previously with 2000 units vitamin D 3  HYPOTHYROIDISM: She has had a multinodular goiter and not clear if she is truly hypothyroid. Over the last couple of years her dose has been tapered down because of low normal TSH levels and has not had any increase  in the size of her goiter.  She is compliant with her thyroid medication in the morning before breakfast. No fatigue  TSH now below normal  even with taking only 50 mcg  Lab Results  Component Value Date   FREET4 1.42 11/08/2013   FREET4 1.37 11/24/2012   TSH 0.322 11/08/2013   TSH 0.612 07/27/2013   TSH 0.67 11/24/2012    Examination:   BP 124/60  Pulse 80  Temp(Src) 98.2 F (36.8 C)  Resp 14  Ht 5\' 7"  (1.702 m)  Wt 202 lb 9.6 oz (91.899 kg)  BMI 31.72 kg/m2  SpO2 94%  Body mass index is 31.72 kg/(m^2).   No lymphadenopathy in the neck  No ankle edema  ASSESSMENT/ PLAN:  ADRENAL MASS  Even though her adrenal mass is described as hypermetabolic on her PET scan the size of the mass is exactly the same as it was 4 years ago on her CT scan. This is unlikely to be a metastatic lesion given the previous detection of this mass Recently her adrenal function evaluation appears to be negative although she has had only a random cortisol done in the afternoon which is high normal Clinically she does not have any features of Cushing's disease, pheochromocytoma or hyperaldosteronism  However will confirm her normal adrenal status with a dexamethasone suppression test as well as plasma metanephrine test  RENAL dysfunction: This may be related to her recent contrast dye but since she has a low normal blood pressure today we'll reduce her lisinopril HCTZ in half  Diabetes type 2   Blood glucose control is overall fairly good with Actos and metformin considering her age She has not brought her monitor for download.  Her weight is stable and A1c will be checked  EDEMA: Not present now; she can continue taking Lasix as needed only.  Hypokalemia: Resolved with potassium supplementation  Hypertension: Well controlled    Multinodular goiter: On low doses of thyroxine suppression with some normal TSH She will reduce her levothyroxine to every other day  [facility] 11/14/2013, 8:55 AM    LABS:  Potassium low, will need to take her supplement more consistently

## 2013-11-15 ENCOUNTER — Encounter (HOSPITAL_COMMUNITY)
Admission: RE | Disposition: A | Discharge status: Home / Self Care | Payer: Self-pay | Source: Ambulatory Visit | Attending: Surgery

## 2013-11-15 ENCOUNTER — Encounter (HOSPITAL_COMMUNITY): Payer: Self-pay | Admitting: *Deleted

## 2013-11-15 ENCOUNTER — Ambulatory Visit (HOSPITAL_COMMUNITY): Payer: Medicare Other

## 2013-11-15 ENCOUNTER — Ambulatory Visit (HOSPITAL_COMMUNITY): Payer: Medicare Other | Admitting: Certified Registered Nurse Anesthetist

## 2013-11-15 ENCOUNTER — Other Ambulatory Visit: Payer: Self-pay | Admitting: *Deleted

## 2013-11-15 ENCOUNTER — Ambulatory Visit (HOSPITAL_COMMUNITY)
Admission: RE | Admit: 2013-11-15 | Discharge: 2013-11-15 | Disposition: A | Discharge status: Home / Self Care | Payer: Medicare Other | Source: Ambulatory Visit | Attending: Surgery | Admitting: Surgery

## 2013-11-15 ENCOUNTER — Encounter (HOSPITAL_COMMUNITY): Payer: Medicare Other | Admitting: Vascular Surgery

## 2013-11-15 DIAGNOSIS — E279 Disorder of adrenal gland, unspecified: Secondary | ICD-10-CM

## 2013-11-15 DIAGNOSIS — E039 Hypothyroidism, unspecified: Secondary | ICD-10-CM | POA: Insufficient documentation

## 2013-11-15 DIAGNOSIS — E042 Nontoxic multinodular goiter: Secondary | ICD-10-CM | POA: Insufficient documentation

## 2013-11-15 DIAGNOSIS — E7800 Pure hypercholesterolemia, unspecified: Secondary | ICD-10-CM

## 2013-11-15 DIAGNOSIS — C50912 Malignant neoplasm of unspecified site of left female breast: Secondary | ICD-10-CM

## 2013-11-15 DIAGNOSIS — E119 Type 2 diabetes mellitus without complications: Secondary | ICD-10-CM | POA: Diagnosis not present

## 2013-11-15 DIAGNOSIS — C50412 Malignant neoplasm of upper-outer quadrant of left female breast: Secondary | ICD-10-CM

## 2013-11-15 DIAGNOSIS — Z87891 Personal history of nicotine dependence: Secondary | ICD-10-CM | POA: Insufficient documentation

## 2013-11-15 DIAGNOSIS — I10 Essential (primary) hypertension: Secondary | ICD-10-CM | POA: Insufficient documentation

## 2013-11-15 DIAGNOSIS — E559 Vitamin D deficiency, unspecified: Secondary | ICD-10-CM

## 2013-11-15 DIAGNOSIS — D649 Anemia, unspecified: Secondary | ICD-10-CM | POA: Diagnosis not present

## 2013-11-15 DIAGNOSIS — C50919 Malignant neoplasm of unspecified site of unspecified female breast: Secondary | ICD-10-CM | POA: Diagnosis present

## 2013-11-15 DIAGNOSIS — E278 Other specified disorders of adrenal gland: Secondary | ICD-10-CM

## 2013-11-15 DIAGNOSIS — Z7982 Long term (current) use of aspirin: Secondary | ICD-10-CM | POA: Insufficient documentation

## 2013-11-15 DIAGNOSIS — E785 Hyperlipidemia, unspecified: Secondary | ICD-10-CM | POA: Insufficient documentation

## 2013-11-15 DIAGNOSIS — R609 Edema, unspecified: Secondary | ICD-10-CM

## 2013-11-15 DIAGNOSIS — Z79899 Other long term (current) drug therapy: Secondary | ICD-10-CM | POA: Diagnosis not present

## 2013-11-15 HISTORY — PX: PORTACATH PLACEMENT: SHX2246

## 2013-11-15 LAB — GLUCOSE, CAPILLARY
Glucose-Capillary: 122 mg/dL — ABNORMAL HIGH (ref 70–99)
Glucose-Capillary: 107 mg/dL — ABNORMAL HIGH (ref 70–99)

## 2013-11-15 SURGERY — INSERTION, TUNNELED CENTRAL VENOUS DEVICE, WITH PORT
Anesthesia: General | Site: Chest

## 2013-11-15 MED ORDER — MIDAZOLAM HCL 2 MG/2ML IJ SOLN
INTRAMUSCULAR | Status: AC
  Filled 2013-11-15: qty 2

## 2013-11-15 MED ORDER — SODIUM CHLORIDE 0.9 % IR SOLN
Status: DC | PRN
  Administered 2013-11-15: 08:00:00

## 2013-11-15 MED ORDER — HEPARIN SOD (PORK) LOCK FLUSH 100 UNIT/ML IV SOLN
INTRAVENOUS | Status: DC | PRN
  Administered 2013-11-15: 500 [IU] via INTRAVENOUS

## 2013-11-15 MED ORDER — HEPARIN SOD (PORK) LOCK FLUSH 100 UNIT/ML IV SOLN
INTRAVENOUS | Status: AC
  Filled 2013-11-15: qty 5

## 2013-11-15 MED ORDER — HYDROMORPHONE HCL PF 1 MG/ML IJ SOLN: 0.2500 mg | INTRAMUSCULAR | Status: DC | PRN

## 2013-11-15 MED ORDER — FENTANYL CITRATE 0.05 MG/ML IJ SOLN
INTRAMUSCULAR | Status: AC
  Filled 2013-11-15: qty 5

## 2013-11-15 MED ORDER — 0.9 % SODIUM CHLORIDE (POUR BTL) OPTIME
TOPICAL | Status: DC | PRN
  Administered 2013-11-15: 1000 mL

## 2013-11-15 MED ORDER — LIDOCAINE HCL (CARDIAC) 20 MG/ML IV SOLN
INTRAVENOUS | Status: DC | PRN
  Administered 2013-11-15: 100 mg via INTRAVENOUS

## 2013-11-15 MED ORDER — ONDANSETRON HCL 4 MG/2ML IJ SOLN
INTRAMUSCULAR | Status: AC
  Filled 2013-11-15: qty 2

## 2013-11-15 MED ORDER — PROPOFOL 10 MG/ML IV BOLUS
INTRAVENOUS | Status: AC
  Filled 2013-11-15: qty 20

## 2013-11-15 MED ORDER — ROCURONIUM BROMIDE 50 MG/5ML IV SOLN
INTRAVENOUS | Status: AC
  Filled 2013-11-15: qty 1

## 2013-11-15 MED ORDER — PROPOFOL 10 MG/ML IV BOLUS
INTRAVENOUS | Status: DC | PRN
  Administered 2013-11-15: 130 mg via INTRAVENOUS
  Administered 2013-11-15: 20 mg via INTRAVENOUS
  Administered 2013-11-15: 50 mg via INTRAVENOUS

## 2013-11-15 MED ORDER — LIDOCAINE HCL (CARDIAC) 20 MG/ML IV SOLN
INTRAVENOUS | Status: AC
  Filled 2013-11-15: qty 5

## 2013-11-15 MED ORDER — METOPROLOL SUCCINATE ER 25 MG PO TB24
25.0000 mg | ORAL_TABLET | Freq: Once | ORAL | Status: AC
  Administered 2013-11-15: 25 mg via ORAL
  Filled 2013-11-15: qty 1

## 2013-11-15 MED ORDER — HYDROCODONE-ACETAMINOPHEN 5-325 MG PO TABS: 1.0000 | ORAL_TABLET | Freq: Four times a day (QID) | ORAL | Status: DC | PRN

## 2013-11-15 MED ORDER — FENTANYL CITRATE 0.05 MG/ML IJ SOLN
INTRAMUSCULAR | Status: DC | PRN
  Administered 2013-11-15: 25 ug via INTRAVENOUS

## 2013-11-15 MED ORDER — ARTIFICIAL TEARS OP OINT
TOPICAL_OINTMENT | OPHTHALMIC | Status: DC | PRN
  Administered 2013-11-15: 1 via OPHTHALMIC

## 2013-11-15 MED ORDER — BUPIVACAINE-EPINEPHRINE (PF) 0.25% -1:200000 IJ SOLN
INTRAMUSCULAR | Status: AC
  Filled 2013-11-15: qty 30

## 2013-11-15 MED ORDER — ONDANSETRON HCL 4 MG/2ML IJ SOLN: 4.0000 mg | Freq: Once | INTRAMUSCULAR | Status: DC | PRN

## 2013-11-15 MED ORDER — ONDANSETRON HCL 4 MG/2ML IJ SOLN
INTRAMUSCULAR | Status: DC | PRN
  Administered 2013-11-15: 4 mg via INTRAVENOUS

## 2013-11-15 MED ORDER — LACTATED RINGERS IV SOLN
INTRAVENOUS | Status: DC | PRN
  Administered 2013-11-15: 08:00:00 via INTRAVENOUS

## 2013-11-15 MED ORDER — BUPIVACAINE-EPINEPHRINE 0.25% -1:200000 IJ SOLN
INTRAMUSCULAR | Status: DC | PRN
  Administered 2013-11-15: 30 mL

## 2013-11-15 SURGICAL SUPPLY — 54 items
ADH SKN CLS APL DERMABOND .7 (GAUZE/BANDAGES/DRESSINGS) ×1
BAG DECANTER FOR FLEXI CONT (MISCELLANEOUS) ×2 IMPLANT
BLADE SURG 11 STRL SS (BLADE) ×2 IMPLANT
CHLORAPREP W/TINT 26ML (MISCELLANEOUS) ×2 IMPLANT
COVER MAYO STAND STRL (DRAPES) ×2 IMPLANT
COVER SURGICAL LIGHT HANDLE (MISCELLANEOUS) ×2 IMPLANT
COVER TRANSDUCER ULTRASND GEL (DRAPE) ×1 IMPLANT
CRADLE DONUT ADULT HEAD (MISCELLANEOUS) ×2 IMPLANT
DECANTER SPIKE VIAL GLASS SM (MISCELLANEOUS) ×2 IMPLANT
DERMABOND ADVANCED (GAUZE/BANDAGES/DRESSINGS) ×1
DERMABOND ADVANCED .7 DNX12 (GAUZE/BANDAGES/DRESSINGS) ×1 IMPLANT
DRAPE C-ARM 42X72 X-RAY (DRAPES) ×2 IMPLANT
DRAPE LAPAROSCOPIC ABDOMINAL (DRAPES) ×2 IMPLANT
DRAPE UTILITY 15X26 W/TAPE STR (DRAPE) ×4 IMPLANT
ELECT CAUTERY BLADE 6.4 (BLADE) ×2 IMPLANT
ELECT REM PT RETURN 9FT ADLT (ELECTROSURGICAL) ×2
ELECTRODE REM PT RTRN 9FT ADLT (ELECTROSURGICAL) ×1 IMPLANT
GAUZE SPONGE 4X4 16PLY XRAY LF (GAUZE/BANDAGES/DRESSINGS) ×2 IMPLANT
GLOVE BIO SURGEON STRL SZ 6.5 (GLOVE) ×1 IMPLANT
GLOVE BIO SURGEON STRL SZ8 (GLOVE) ×2 IMPLANT
GLOVE BIOGEL PI IND STRL 6.5 (GLOVE) IMPLANT
GLOVE BIOGEL PI IND STRL 8 (GLOVE) ×1 IMPLANT
GLOVE BIOGEL PI INDICATOR 6.5 (GLOVE) ×2
GLOVE BIOGEL PI INDICATOR 8 (GLOVE) ×1
GOWN STRL REUS W/ TWL LRG LVL3 (GOWN DISPOSABLE) ×2 IMPLANT
GOWN STRL REUS W/ TWL XL LVL3 (GOWN DISPOSABLE) ×1 IMPLANT
GOWN STRL REUS W/TWL LRG LVL3 (GOWN DISPOSABLE) ×4
GOWN STRL REUS W/TWL XL LVL3 (GOWN DISPOSABLE) ×2
INTRODUCER COOK 11FR (CATHETERS) IMPLANT
KIT BASIN OR (CUSTOM PROCEDURE TRAY) ×2 IMPLANT
KIT PORT POWER 8FR ISP CVUE (Catheter) ×1 IMPLANT
KIT PORT POWER 9.6FR MRI PREA (Catheter) IMPLANT
KIT PORT POWER ISP 8FR (Catheter) IMPLANT
KIT POWER CATH 8FR (Catheter) IMPLANT
KIT ROOM TURNOVER OR (KITS) ×2 IMPLANT
NDL HYPO 25GX1X1/2 BEV (NEEDLE) ×1 IMPLANT
NEEDLE HYPO 25GX1X1/2 BEV (NEEDLE) ×2 IMPLANT
NS IRRIG 1000ML POUR BTL (IV SOLUTION) ×2 IMPLANT
PACK SURGICAL SETUP 50X90 (CUSTOM PROCEDURE TRAY) ×2 IMPLANT
PAD ARMBOARD 7.5X6 YLW CONV (MISCELLANEOUS) ×4 IMPLANT
PENCIL BUTTON HOLSTER BLD 10FT (ELECTRODE) ×2 IMPLANT
SET INTRODUCER 12FR PACEMAKER (SHEATH) IMPLANT
SET SHEATH INTRODUCER 10FR (MISCELLANEOUS) IMPLANT
SHEATH COOK PEEL AWAY SET 9F (SHEATH) IMPLANT
SUT MNCRL AB 4-0 PS2 18 (SUTURE) ×2 IMPLANT
SUT PROLENE 2 0 SH 30 (SUTURE) ×4 IMPLANT
SUT VIC AB 3-0 SH 27 (SUTURE) ×2
SUT VIC AB 3-0 SH 27X BRD (SUTURE) ×1 IMPLANT
SYR 20ML ECCENTRIC (SYRINGE) ×4 IMPLANT
SYR 5ML LUER SLIP (SYRINGE) ×2 IMPLANT
SYR CONTROL 10ML LL (SYRINGE) ×2 IMPLANT
TOWEL OR 17X24 6PK STRL BLUE (TOWEL DISPOSABLE) ×2 IMPLANT
TOWEL OR 17X26 10 PK STRL BLUE (TOWEL DISPOSABLE) ×2 IMPLANT
WATER STERILE IRR 1000ML POUR (IV SOLUTION) IMPLANT

## 2013-11-15 NOTE — Anesthesia Preprocedure Evaluation (Addendum)
Anesthesia Evaluation  Patient identified by MRN, date of birth, ID band Patient awake    Reviewed: Allergy & Precautions, H&P , NPO status , Patient's Chart, lab work & pertinent test results, reviewed documented beta blocker date and time   Airway Mallampati: I TM Distance: >3 FB Neck ROM: Full    Dental  (+) Missing, Edentulous Upper, Partial Lower, Dental Advisory Given   Pulmonary former smoker,          Cardiovascular hypertension, Pt. on medications     Neuro/Psych    GI/Hepatic   Endo/Other  diabetes, Well Controlled, Type 2, Oral Hypoglycemic Agents  Renal/GU      Musculoskeletal   Abdominal   Peds  Hematology  (+) anemia ,   Anesthesia Other Findings   Reproductive/Obstetrics                          Anesthesia Physical Anesthesia Plan  ASA: III  Anesthesia Plan: General   Post-op Pain Management:    Induction: Intravenous  Airway Management Planned: LMA  Additional Equipment:   Intra-op Plan:   Post-operative Plan: Extubation in OR  Informed Consent: I have reviewed the patients History and Physical, chart, labs and discussed the procedure including the risks, benefits and alternatives for the proposed anesthesia with the patient or authorized representative who has indicated his/her understanding and acceptance.   Dental advisory given  Plan Discussed with: CRNA and Anesthesiologist  Anesthesia Plan Comments:         Anesthesia Quick Evaluation

## 2013-11-15 NOTE — Op Note (Signed)
Whitney [name] 03/21/1927 [id number] 11/15/2013   Preoperative diagnosis: PAC needed  Postoperative diagnosis: Same  Procedure: Portacath Placement with U/S guidance and fluoroscopy right internal jugular   Surgeon: Turner Daniels, MD, FACS  Anesthesia: LMA WITH 0.25 % MARCAINE WITH EPINEPHRINE   Clinical History and Indications: The patient is getting ready to begin chemotherapy for her cancer. She  needs a Port-A-Cath for venous access.The procedure has been discussed with the patient.  Alternative therapies have been discussed with the patient.  Operative risks include bleeding,  Infection,  Organ injury, pneumothorax,  Hemothorax,   Nerve injury,  Blood vessel injury,  DVT,  Pulmonary embolism,  Death,  And possible reoperation.  Medical management risks include worsening of present situation.  The success of the procedure is 50 -100 % at treating patients symptoms.  The patient understands and agrees to proceed.  Description of Procedure: I have seen the patient in the holding area and confirmed the plans for the procedure as noted above. I reviewed the risks and complications again and the patient has no further questions. She wishes to proceed.   The patient was then taken to the operating room. After satisfactory LMA  anesthesia had been obtained the upper chest and lower neck were prepped and draped as a sterile field. The timeout was done.  The right INTERNAL jugular  vein was imaged using real time U/S in the right neck and  entered  Under U/S DIRECT VISION  and the guidewire threaded into the superior vena cava right atrial area under fluoroscopic guidance. An incision was then made on the anterior chest wall and a subcutaneous pocket fashioned for the port reservoir.  The port tubing was then brought through a subcutaneous tunnel from the port site to the guidewire site. The dilator and peel-away sheath were then advanced over the guidewire while monitoring this with  fluoroscopy. The guidewire and dilator were removed and the tubing threaded to approximately 22 cm. The peel-away sheath was then removed. The catheter aspirated and flushed easily. Using fluoroscopy the tip was backed out into the superior vena cava right atrial junction area. It aspirated and flushed easily. The reservoir was attached and the locking mechanism engaged. That aspirated and flushed easily.  The reservoir was secured to the fascia with 1 sutures of 2-0 Prolene. A final check with fluoroscopy was done to make sure we had no kinks and good positioning of the tip of the catheter. Everything appeared to be okay. The catheter was aspirated, flushed with dilute heparin and then concentrated aqueous heparin.  The incision was then closed with interrupted 3-0 Vicryl, and 4-0 Monocryl subcuticular with Dermabond on the skin.  There were no operative complications. Estimated blood loss was minimal. All counts were correct. The patient tolerated the procedure well.  Turner Daniels, MD, FACS 11/15/2013 9:13 AM

## 2013-11-15 NOTE — H&P (View-Only) (Signed)
Patient ID: Whitney [name], female   DOB: [birth year], 80 y.o.   MRN: [id number]  No chief complaint on file.   HPI [name] is a 80 y.o. female.  Pt sent at the request of Dr Dwyane Dee for right breast cancer found during screening mammogram.  Pt denies any breast mas,  Discharge or breast pain. HPI  Past Medical History  Diagnosis Date  . Hypertension   . Hyperlipidemia   . Diabetes mellitus without complication   . Thyroid disease     hypothyroidism  . Anemia   . Former smoker   . Multinodular goiter     Past Surgical History  Procedure Laterality Date  . Eye surgery  12/22/2009    No family history on file.  Social History History  Substance Use Topics  . Smoking status: Never Smoker   . Smokeless tobacco: Never Used  . Alcohol Use: No    No Known Allergies  Current Outpatient Prescriptions  Medication Sig Dispense Refill  . amLODipine (NORVASC) 5 MG tablet TAKE 1 TABLET (5 MG TOTAL) BY MOUTH DAILY.  90 tablet  0  . aspirin 81 MG tablet Take 81 mg by mouth daily.      Marland Kitchen atorvastatin (LIPITOR) 10 MG tablet TAKE 1 TABLET (10 MG TOTAL) BY MOUTH DAILY.  90 tablet  0  . [employer] VITAMIN D 2000 UNITS CAPS TAKE 1 CAPSULE BY MOUTH DAILY FOR 90 DAYS  100 capsule  3  . ferrous sulfate 325 (65 FE) MG EC tablet Take 325 mg by mouth daily with breakfast.      . furosemide (LASIX) 40 MG tablet Take 1-2 tablets daily as needed  60 tablet  5  . glucose blood test strip 1 each by Other route as needed for other (One Touch Ultra). Use as instructed      . levothyroxine (SYNTHROID, LEVOTHROID) 50 MCG tablet Take 1 tablet (50 mcg total) by mouth daily before breakfast.  90 tablet  0  . lisinopril-hydrochlorothiazide (PRINZIDE,ZESTORETIC) 20-12.5 MG per tablet TAKE 1 TABLET BY MOUTH DAILY.  90 tablet  0  . metFORMIN (GLUCOPHAGE-XR) 500 MG 24 hr tablet TAKE 1 TABLET BY MOUTH IN THE MORNING AND 2 AT NIGHT      . metoprolol succinate (TOPROL-XL) 25 MG 24 hr tablet Take 1 tablet (25 mg  total) by mouth daily.  90 tablet  0  . pioglitazone (ACTOS) 30 MG tablet Take 1 tablet (30 mg total) by mouth daily.  90 tablet  0  . potassium chloride SA (KLOR-CON M20) 20 MEQ tablet TAKE 1 TABLET (20 MEQ TOTAL) BY MOUTH DAILY.  90 tablet  0  . vitamin B-12 (CYANOCOBALAMIN) 1000 MCG tablet Take 1,000 mcg by mouth daily.       No current facility-administered medications for this visit.    Review of Systems Review of Systems  Constitutional: Negative for fever, chills and unexpected weight change.  HENT: Negative for congestion, hearing loss, sore throat, trouble swallowing and voice change.   Eyes: Negative for visual disturbance.  Respiratory: Negative for cough and wheezing.   Cardiovascular: Negative for chest pain, palpitations and leg swelling.  Gastrointestinal: Negative for nausea, vomiting, abdominal pain, diarrhea, constipation, blood in stool, abdominal distention and anal bleeding.  Genitourinary: Negative for hematuria, vaginal bleeding and difficulty urinating.  Musculoskeletal: Negative for arthralgias.  Skin: Negative for rash and wound.  Neurological: Negative for seizures, syncope and headaches.  Hematological: Negative for adenopathy. Does not bruise/bleed easily.  Psychiatric/Behavioral: Negative  for confusion.    There were no vitals taken for this visit.  Physical Exam Physical Exam  Constitutional: She is oriented to person, place, and time. She appears well-developed and well-nourished.  HENT:  Head: Normocephalic.  Neck: Trachea normal.  Cardiovascular: Normal rate and regular rhythm.   Pulmonary/Chest: Effort normal. Right breast exhibits no inverted nipple, no mass, no nipple discharge, no skin change and no tenderness. Left breast exhibits no inverted nipple, no mass, no nipple discharge, no skin change and no tenderness. Breasts are symmetrical.  Musculoskeletal: Normal range of motion.  Lymphadenopathy:    She has no cervical adenopathy.    She has  no axillary adenopathy.  Neurological: She is alert and oriented to person, place, and time.  Skin: Skin is warm.  Psychiatric: She has a normal mood and affect. Her behavior is normal. Judgment and thought content normal.    Data Reviewed       MR Breast Bilateral W Wo Contrast Status: Final result         PACS Images    Show images for MR Breast Bilateral W Wo Contrast         Study Result    CLINICAL DATA: Recently diagnosed right breast invasive mammary  carcinoma. Preop evaluation.  LABS: BUN and creatinine were obtained on site at [facility] at  [address]  Results: BUN 20 mg/dL, Creatinine 0.8 mg/dL.  EXAM:  BILATERAL BREAST MRI WITH AND WITHOUT CONTRAST  TECHNIQUE:  Multiplanar, multisequence MR images of both breasts were obtained  prior to and following the intravenous administration of 27ml of  MultiHance  THREE-DIMENSIONAL MR IMAGE RENDERING ON INDEPENDENT WORKSTATION:  Three-dimensional MR images were rendered by post-processing of the  original MR data on an independent workstation. The  three-dimensional MR images were interpreted, and findings are  reported in the following complete MRI report for this study. Three  dimensional images were evaluated at the independent DynaCad  workstation  COMPARISON: Previous mammograms dated 10/20/2013, 10/06/2013.  Previous ultrasound images dated 10/20/2013.  FINDINGS:  Breast composition: b. Scattered fibroglandular tissue.  Background parenchymal enhancement: Mild  Right breast: There is an irregular enhancing mass located within  the upper outer quadrant of the right breast (posterior 1/3) which  corresponds to recently diagnosed right breast invasive mammary  carcinoma. Clip artifact is seen along the lateral posterior border  of this mass. The mass measures 3.1 x 1.9 x 2.2 cm in size and is  associated with a mixture of plateau and wash -in /washout  enhancement kinetics. There are  no additional worrisome enhancing  foci within the right breast.  Left breast: No mass or abnormal enhancement.  Lymph nodes: There is level 1 right axillary lymph node present  measuring 1.1 x 0.9 x 0.6 cm in size and associated with mild  decrease in central hilar fat. Most likely this corresponds to the  recently biopsied right axillary lymph node (under ultrasound  guidance). There are no additional abnormal appearing lymph nodes.  Ancillary findings: None.  IMPRESSION:  3.1 cm irregular enhancing mass located within the upper outer  quadrant of the right breast correspond to the recently diagnosed  right breast invasive mammary carcinoma. There is a slightly  abnormal appearing level 1 right axillary lymph node present which  most likely corresponds to the lymph node recently biopsied under  ultrasound guidance. No additional findings.  RECOMMENDATION:  Treatment plan.  BI-RADS CATEGORY 6: Known biopsy-proven malignancy.  Electronically Signed  By: Luberta Robertson M.D.  On: 10/31/2013 12:04             External Result Report    External Result Report            Imaging    Imaging Information            Signed by    Signed Date/Time   Phone Pager   [place] SR., F. [facility] 10/31/2013 12:04 PM [phone]         Exam Information    Status Exam Begun   Exam Ended     Final [99] 10/31/2013 10:13 AM 10/31/2013 10:40 AM                  Imaging Related Medications    Medication   gadobenate dimeglumine (MULTIHANCE) injection 19 mL   Route: Intravenous   Admin Dose: 19 mL   Volume: 20 mL   PRN Reason(s): contrast   Last Admin Time: 10/31/13 1042   Number of Doses: 1       Most Recent Administration:     User Action Time Recorded Time Dose Route Site Comment Action Reason    Lajoyce Lauber 10/31/13 1042 10/31/13 1042 19 mL Intravenous   Contrast Given     Full Administration Report                     Original  Order    Ordered On Ordered By     Mon Oct 23, 2013 10:54 AM Sharol Roussel                        MR Breast Bilateral W Wo Contrast (Order [id number]) Released By: Adelene Amas Date: 10/31/2013  Imaging Authorizing: [name], MD Department: [facility]  Order: [id number]               Order Information     Order Date/Time Release Date/Time Start Date/Time End Date/Time    10/31/13 08:43 AM 10/31/13 08:43 AM 10/31/13 08:43 AM 10/31/2013              Order Details     Frequency Duration Priority Order Class    As needed 1 occurrence Routine Ancillary Performed              Imaging CC Recipients              Associated Diagnoses       ICD-9-CM ICD-10-CM    Malignant neoplasm of breast (female), unspecified site  174.9 C50.919                       Collection Information     Resulting Agency: [facility]             Reviewed by List     [name], MD on 10/31/2013 5:28 PM    [name], MD on 10/31/2013 2:13 PM    [name], MD on 10/31/2013 12:46 PM              Order Provider Info       Office phone Pager/beeper E-mail    Ordering User [place] [facility] [phone] -- --    Authorizing Provider [name], MD [phone] -- --    Billing Provider F. Altamese Cabal Sr., MD [phone] --  Reprint Requisition     MR Breast Bilateral W Wo Contrast (Order #[id number]) on 10/31/13             Original Order     Ordered On Ordered By      Mon Oct 23, 2013 10:54 AM [facility] [place] [facility]                   Assessment    Right breast cancer stage 2 ER PR POSITIVE     Plan    Pt will need chemotherapy.  Recommend neoadjuvant chemotherapy and breast conservation down the road.  May be eligible for study.  Discussed port placement with risk,  Benefits and alternatives.  She wishes to proceed.          Alix Stowers A. 11/01/2013, 2:59 PM

## 2013-11-15 NOTE — Anesthesia Procedure Notes (Signed)
Procedure Name: LMA Insertion Date/Time: 11/15/2013 8:26 AM Performed by: Ollen Bowl Pre-anesthesia Checklist: Patient identified, Emergency Drugs available, Suction available, Patient being monitored and Timeout performed Patient Re-evaluated:Patient Re-evaluated prior to inductionOxygen Delivery Method: Circle system utilized and Simple face mask Preoxygenation: Pre-oxygenation with 100% oxygen Intubation Type: IV induction Ventilation: Mask ventilation without difficulty LMA Size: 5.0 Number of attempts: 1 Airway Equipment and Method: Patient positioned with wedge pillow Placement Confirmation: positive ETCO2 and breath sounds checked- equal and bilateral Tube secured with: Tape Dental Injury: Teeth and Oropharynx as per pre-operative assessment

## 2013-11-15 NOTE — Discharge Instructions (Signed)

## 2013-11-15 NOTE — Transfer of Care (Signed)
Immediate Anesthesia Transfer of Care Note  Patient: Whitney [name]  Procedure(s) Performed: Procedure(s): INSERTION PORT-A-CATH WITH ULTRA SOUND AND FLOROSCOPY (N/A)  Patient Location: PACU  Anesthesia Type:General  Level of Consciousness: awake, alert  and oriented  Airway & Oxygen Therapy: Patient Spontanous Breathing and Patient connected to nasal cannula oxygen  Post-op Assessment: Report given to PACU RN, Post -op Vital signs reviewed and stable and Patient moving all extremities X 4  Post vital signs: Reviewed and stable  Complications: No apparent anesthesia complications

## 2013-11-15 NOTE — Anesthesia Postprocedure Evaluation (Signed)
  Anesthesia Post-op Note  Patient: Whitney [name]  Procedure(s) Performed: Procedure(s): INSERTION PORT-A-CATH WITH ULTRA SOUND AND FLOROSCOPY (N/A)  Patient Location: PACU  Anesthesia Type:General  Level of Consciousness: awake, alert , oriented and patient cooperative  Airway and Oxygen Therapy: Patient Spontanous Breathing  Post-op Pain: none  Post-op Assessment: Post-op Vital signs reviewed, Patient's Cardiovascular Status Stable, Respiratory Function Stable, Patent Airway and No signs of Nausea or vomiting  Post-op Vital Signs: stable  Last Vitals:  Filed Vitals:   11/15/13 1022  BP: 156/82  Pulse:   Temp:   Resp: 15    Complications: No apparent anesthesia complications

## 2013-11-15 NOTE — Interval H&P Note (Signed)
History and Physical Interval Note:  11/15/2013 8:09 AM  Whitney [name]  has presented today for surgery, with the diagnosis of breast cancer  The various methods of treatment have been discussed with the patient and family. After consideration of risks, benefits and other options for treatment, the patient has consented to  Procedure(s): INSERTION PORT-A-CATH WITH ULTRA SOUND AND FLOROSCOPY (N/A) as a surgical intervention .  The patient's history has been reviewed, patient examined, no change in status, stable for surgery.  I have reviewed the patient's chart and labs.  Questions were answered to the patient's satisfaction.     Chance Munter A.

## 2013-11-16 ENCOUNTER — Ambulatory Visit (HOSPITAL_BASED_OUTPATIENT_CLINIC_OR_DEPARTMENT_OTHER): Payer: Medicare Other

## 2013-11-16 ENCOUNTER — Encounter (HOSPITAL_COMMUNITY): Payer: Self-pay | Admitting: Surgery

## 2013-11-16 ENCOUNTER — Other Ambulatory Visit (HOSPITAL_BASED_OUTPATIENT_CLINIC_OR_DEPARTMENT_OTHER): Payer: Medicare Other

## 2013-11-16 ENCOUNTER — Ambulatory Visit (HOSPITAL_BASED_OUTPATIENT_CLINIC_OR_DEPARTMENT_OTHER): Payer: Medicare Other | Admitting: Hematology and Oncology

## 2013-11-16 VITALS — BP 147/67 | HR 71 | Temp 98.3°F | Resp 18 | Ht 67.0 in | Wt 201.1 lb

## 2013-11-16 DIAGNOSIS — Z170 Estrogen receptor positive status [ER+]: Secondary | ICD-10-CM

## 2013-11-16 DIAGNOSIS — C773 Secondary and unspecified malignant neoplasm of axilla and upper limb lymph nodes: Secondary | ICD-10-CM

## 2013-11-16 DIAGNOSIS — C50419 Malignant neoplasm of upper-outer quadrant of unspecified female breast: Secondary | ICD-10-CM

## 2013-11-16 DIAGNOSIS — Z5111 Encounter for antineoplastic chemotherapy: Secondary | ICD-10-CM

## 2013-11-16 DIAGNOSIS — C50412 Malignant neoplasm of upper-outer quadrant of left female breast: Secondary | ICD-10-CM

## 2013-11-16 DIAGNOSIS — C50919 Malignant neoplasm of unspecified site of unspecified female breast: Secondary | ICD-10-CM

## 2013-11-16 LAB — CBC WITH DIFFERENTIAL/PLATELET
BASO%: 0.6 % (ref 0.0–2.0)
Basophils Absolute: 0 10*3/uL (ref 0.0–0.1)
EOS%: 1.1 % (ref 0.0–7.0)
Eosinophils Absolute: 0.1 10*3/uL (ref 0.0–0.5)
HCT: 34 % — ABNORMAL LOW (ref 34.8–46.6)
HGB: 11 g/dL — ABNORMAL LOW (ref 11.6–15.9)
LYMPH%: 13.2 % — ABNORMAL LOW (ref 14.0–49.7)
MCH: 28.4 pg (ref 25.1–34.0)
MCHC: 32.3 g/dL (ref 31.5–36.0)
MCV: 87.9 fL (ref 79.5–101.0)
MONO#: 0.5 10*3/uL (ref 0.1–0.9)
MONO%: 8.1 % (ref 0.0–14.0)
NEUT#: 4.7 10*3/uL (ref 1.5–6.5)
NEUT%: 77 % — ABNORMAL HIGH (ref 38.4–76.8)
Platelets: 232 10*3/uL (ref 145–400)
RBC: 3.86 10*6/uL (ref 3.70–5.45)
RDW: 16.4 % — ABNORMAL HIGH (ref 11.2–14.5)
WBC: 6.1 10*3/uL (ref 3.9–10.3)
lymph#: 0.8 10*3/uL — ABNORMAL LOW (ref 0.9–3.3)

## 2013-11-16 LAB — COMPREHENSIVE METABOLIC PANEL (CC13)
ALT: 11 U/L (ref 0–55)
AST: 14 U/L (ref 5–34)
Albumin: 3.4 g/dL — ABNORMAL LOW (ref 3.5–5.0)
Alkaline Phosphatase: 62 U/L (ref 40–150)
Anion Gap: 9 mEq/L (ref 3–11)
BUN: 18.5 mg/dL (ref 7.0–26.0)
CO2: 29 mEq/L (ref 22–29)
Calcium: 9.8 mg/dL (ref 8.4–10.4)
Chloride: 105 mEq/L (ref 98–109)
Creatinine: 0.9 mg/dL (ref 0.6–1.1)
Glucose: 137 mg/dl (ref 70–140)
Potassium: 3.4 mEq/L — ABNORMAL LOW (ref 3.5–5.1)
Sodium: 143 mEq/L (ref 136–145)
Total Bilirubin: 0.36 mg/dL (ref 0.20–1.20)
Total Protein: 6.6 g/dL (ref 6.4–8.3)

## 2013-11-16 MED ORDER — PALONOSETRON HCL INJECTION 0.25 MG/5ML
0.2500 mg | Freq: Once | INTRAVENOUS | Status: AC
  Administered 2013-11-16: 0.25 mg via INTRAVENOUS

## 2013-11-16 MED ORDER — DEXAMETHASONE SODIUM PHOSPHATE 20 MG/5ML IJ SOLN
12.0000 mg | Freq: Once | INTRAMUSCULAR | Status: AC
  Administered 2013-11-16: 12 mg via INTRAVENOUS

## 2013-11-16 MED ORDER — HEPARIN SOD (PORK) LOCK FLUSH 100 UNIT/ML IV SOLN
500.0000 [IU] | Freq: Once | INTRAVENOUS | Status: AC | PRN
  Administered 2013-11-16: 500 [IU]
  Filled 2013-11-16: qty 5

## 2013-11-16 MED ORDER — SODIUM CHLORIDE 0.9 % IV SOLN
150.0000 mg | Freq: Once | INTRAVENOUS | Status: AC
  Administered 2013-11-16: 150 mg via INTRAVENOUS
  Filled 2013-11-16: qty 5

## 2013-11-16 MED ORDER — LIDOCAINE-PRILOCAINE 2.5-2.5 % EX CREA: 1.0000 "application " | TOPICAL_CREAM | CUTANEOUS | Status: DC | PRN

## 2013-11-16 MED ORDER — PALONOSETRON HCL INJECTION 0.25 MG/5ML
INTRAVENOUS | Status: AC
  Filled 2013-11-16: qty 5

## 2013-11-16 MED ORDER — DEXAMETHASONE SODIUM PHOSPHATE 20 MG/5ML IJ SOLN
INTRAMUSCULAR | Status: AC
  Filled 2013-11-16: qty 5

## 2013-11-16 MED ORDER — SODIUM CHLORIDE 0.9 % IV SOLN
500.0000 mg/m2 | Freq: Once | INTRAVENOUS | Status: AC
  Administered 2013-11-16: 1040 mg via INTRAVENOUS
  Filled 2013-11-16: qty 52

## 2013-11-16 MED ORDER — SODIUM CHLORIDE 0.9 % IV SOLN
Freq: Once | INTRAVENOUS | Status: AC
  Administered 2013-11-16: 11:00:00 via INTRAVENOUS

## 2013-11-16 MED ORDER — SODIUM CHLORIDE 0.9 % IJ SOLN
10.0000 mL | INTRAMUSCULAR | Status: DC | PRN
  Administered 2013-11-16: 10 mL
  Filled 2013-11-16: qty 10

## 2013-11-16 MED ORDER — DOXORUBICIN HCL CHEMO IV INJECTION 2 MG/ML
50.0000 mg/m2 | Freq: Once | INTRAVENOUS | Status: AC
  Administered 2013-11-16: 104 mg via INTRAVENOUS
  Filled 2013-11-16: qty 52

## 2013-11-16 NOTE — Patient Instructions (Signed)
Apply EMLA cream 1.5 hours prior to your appointment IN [facility].  Apply EMLA on the port in approximately the size of a quarter.  Do not rub in.  EMLA should sit on top of the skin - you should see white.  Cover with plastic or cap so that cream is not absorbed by the clothes.

## 2013-11-16 NOTE — Progress Notes (Signed)
Patient Care Team: [name], MD as PCP - General (Family Medicine) [name], MD as Consulting Physician (Hematology and Oncology) [name], MD as Consulting Physician (General Surgery) [name], MD as Consulting Physician (Radiation Oncology)  DIAGNOSIS: Breast cancer of upper-outer quadrant of left female breast   Primary site: Breast (Right)   Staging method: AJCC 7th Edition   Clinical: Stage IIB (T2, N1, cM0) signed by [name], MD on 11/01/2013  8:37 AM   Summary: Stage IIB (T2, N1, cM0)   SUMMARY OF ONCOLOGIC HISTORY:   Breast cancer of upper-outer quadrant of left female breast   10/20/2013 Mammogram Ultrasound and mammogram showed 2.1 x 2.1 x 1.9 cm right breast mass   10/20/2013 Initial Diagnosis Breast cancer of upper-outer quadrant of left female breast. Invasive ductal cancer with lymphovascular invasion one lymph node biopsy that was positive for cancer grade 1; Her 2 Neg Ratio 0.96, ER100% PR 8% positive Ki 67: 11%;   10/31/2013 Breast MRI Right breast upper outer quadrant: 3.1 x 1.9 x 2.2 cm: Right axillary lymph node 1.1 x 0.9 x 0.6 cm    CHIEF COMPLIANT: Patient is here for chemotherapy cycle 1 day 1 of dose dense Adriamycin and Cytoxan  INTERVAL HISTORY: Whitney [name] is an 80 year old Caucasian lady with above-mentioned history of stage II B. clinical stage invasive ductal carcinoma left breast with lymphovascular invasion with one positive lymph node. She underwent port placement and is here today to start for cycle of neoadjuvant chemotherapy with Adriamycin and Cytoxan. She reports no new problems or concerns she is ready to get started and is accompanied by her family.   REVIEW OF SYSTEMS:   Constitutional: Denies fevers, chills or abnormal weight loss Eyes: Denies blurriness of vision Ears, nose, mouth, throat, and face: Denies mucositis or sore throat Respiratory: Denies cough, dyspnea or wheezes Cardiovascular: Denies palpitation, chest  discomfort or lower extremity swelling Gastrointestinal:  Denies nausea, heartburn or change in bowel habits Skin: Denies abnormal skin rashes Lymphatics: Denies new lymphadenopathy or easy bruising Neurological:Denies numbness, tingling or new weaknesses Behavioral/Psych: Mood is stable, no new changes  Breast:denies any pain or lumps or nodules in either breasts All other systems were reviewed with the patient and are negative.  I have reviewed the past medical history, past surgical history, social history and family history with the patient and they are unchanged from previous note.  ALLERGIES:  has No Known Allergies.  MEDICATIONS:  Current Outpatient Prescriptions  Medication Sig Dispense Refill  . aspirin 81 MG tablet Take 81 mg by mouth daily.      Marland Kitchen atorvastatin (LIPITOR) 10 MG tablet Take 10 mg by mouth daily.      . Cholecalciferol (VITAMIN D) 2000 UNITS CAPS Take 2,000 Units by mouth daily.      Marland Kitchen dexamethasone (DECADRON) 1 MG tablet at bedtime before test  1 tablet  0  . dexamethasone (DECADRON) 4 MG tablet Take 2 tablets by mouth once a day on the day after chemotherapy and then take 2 tablets two times a day for 2 days. Take with food.  30 tablet  1  . ferrous sulfate 325 (65 FE) MG EC tablet Take 325 mg by mouth daily with breakfast.      . furosemide (LASIX) 40 MG tablet Take 1-2 tablets daily as needed      . glucose blood test strip 1 each by Other route as needed for other (One Touch Ultra). Use as instructed      .  HYDROcodone-acetaminophen (NORCO) 5-325 MG per tablet Take 1 tablet by mouth every 6 (six) hours as needed for moderate pain.  30 tablet  0  . levothyroxine (SYNTHROID, LEVOTHROID) 50 MCG tablet Take 1 tablet (50 mcg total) by mouth daily before breakfast.  90 tablet  0  . lisinopril-hydrochlorothiazide (PRINZIDE,ZESTORETIC) 20-12.5 MG per tablet Take 1 tablet by mouth daily.      Marland Kitchen LORazepam (ATIVAN) 0.5 MG tablet Take 1 tablet (0.5 mg total) by mouth every  6 (six) hours as needed (Nausea or vomiting).  30 tablet  0  . metFORMIN (GLUCOPHAGE-XR) 500 MG 24 hr tablet Take 500-1,000 mg by mouth 2 (two) times daily. Take 500 mg every morning and 1000 mg every evening      . metoprolol succinate (TOPROL-XL) 25 MG 24 hr tablet Take 1 tablet (25 mg total) by mouth daily.  90 tablet  0  . ondansetron (ZOFRAN) 8 MG tablet Take 1 tablet (8 mg total) by mouth 2 (two) times daily as needed. Start on the third day after chemotherapy.  30 tablet  1  . pioglitazone (ACTOS) 30 MG tablet Take 1 tablet (30 mg total) by mouth daily.  90 tablet  0  . Polyethyl Glycol-Propyl Glycol (SYSTANE OP) Place 1 drop into both eyes 3 (three) times daily.      . potassium chloride SA (K-DUR,KLOR-CON) 20 MEQ tablet Take 20 mEq by mouth daily.      . prochlorperazine (COMPAZINE) 10 MG tablet Take 1 tablet (10 mg total) by mouth every 6 (six) hours as needed (Nausea or vomiting).  30 tablet  1  . vitamin B-12 (CYANOCOBALAMIN) 1000 MCG tablet Take 1,000 mcg by mouth daily.      Marland Kitchen lidocaine-prilocaine (EMLA) cream Apply 1 application topically as needed.  30 g  0   No current facility-administered medications for this visit.    PHYSICAL EXAMINATION: ECOG PERFORMANCE STATUS: 0 - Asymptomatic  Filed Vitals:   11/16/13 0913  BP: 147/67  Pulse: 71  Temp: 98.3 F (36.8 C)  Resp: 18   Filed Weights   11/16/13 0913  Weight: 201 lb 1 oz (91.201 kg)    GENERAL:alert, no distress and comfortable SKIN: skin color, texture, turgor are normal, no rashes or significant lesions EYES: normal, Conjunctiva are pink and non-injected, sclera clear OROPHARYNX:no exudate, no erythema and lips, buccal mucosa, and tongue normal  NECK: supple, thyroid normal size, non-tender, without nodularity LYMPH:  no palpable lymphadenopathy in the cervical, axillary or inguinal LUNGS: clear to auscultation and percussion with normal breathing effort HEART: regular rate & rhythm and no murmurs and no lower  extremity edema ABDOMEN:abdomen soft, non-tender and normal bowel sounds Musculoskeletal:no cyanosis of digits and no clubbing  NEURO: alert & oriented x 3 with fluent speech, no focal motor/sensory deficits   LABORATORY DATA:  I have reviewed the data as listed   Chemistry      Component Value Date/Time   NA 143 11/16/2013 0901   NA 140 11/13/2013 1540   K 3.4* 11/16/2013 0901   K 3.8 11/13/2013 1540   CL 100 11/13/2013 1540   CO2 29 11/16/2013 0901   CO2 28 11/13/2013 1540   BUN 18.5 11/16/2013 0901   BUN 27* 11/13/2013 1540   CREATININE 0.9 11/16/2013 0901   CREATININE 1.34* 11/13/2013 1540   CREATININE 0.88 07/27/2013 0913      Component Value Date/Time   CALCIUM 9.8 11/16/2013 0901   CALCIUM 10.2 11/13/2013 1540   ALKPHOS 62 11/16/2013  0901   ALKPHOS 61 11/13/2013 1540   AST 14 11/16/2013 0901   AST 14 11/13/2013 1540   ALT 11 11/16/2013 0901   ALT 9 11/13/2013 1540   BILITOT 0.36 11/16/2013 0901   BILITOT 0.3 11/13/2013 1540       Lab Results  Component Value Date   WBC 6.1 11/16/2013   HGB 11.0* 11/16/2013   HCT 34.0* 11/16/2013   MCV 87.9 11/16/2013   PLT 232 11/16/2013   NEUTROABS 4.7 11/16/2013     RADIOGRAPHIC STUDIES: I have personally reviewed the radiology reports and agreed with their findings. Dg Chest Port 1 View  11/15/2013   CLINICAL DATA:  Line placement.  EXAM: PORTABLE CHEST - 1 VIEW  COMPARISON:  11/13/2013  FINDINGS: Right-sided Port-A-Cath has been placed, tip overlying the level of the superior vena cava. The heart is enlarged. Aorta is tortuous. No pneumothorax. No focal consolidations, pleural effusions, or pulmonary edema.  IMPRESSION: 1. Interval placement of right-sided Port-A-Cath. 2. Cardiomegaly without pulmonary edema.   Electronically Signed   By: Shon Hale M.D.   On: 11/15/2013 09:59   Dg Fluoro Guide Cv Line-no Report  11/15/2013   CLINICAL DATA: left breast cancer   FLOURO GUIDE CV LINE  Fluoroscopy was utilized by the requesting physician.  No radiographic   interpretation.      ASSESSMENT & PLAN:  Breast cancer of upper-outer quadrant of left female breast Left breast cancer stage IIB ER/PR positive HER-2 negative: Patient is starting neoadjuvant chemotherapy with dose dense Adriamycin and Cytoxan. I'm starting her on a slightly lower dosage at 50 per meter square on Adriamycin and 500 per meter square and Cytoxan to assess her tolerability to treatment. I reviewed that chemotherapy regimen once again and she will start treatment today. She will come back tomorrow for Neulasta injection.  Return to clinic in one week for toxicity check CBC with differential and followup with Mendel Ryder.  Multinodular goiter and adrenal nodules: Being followed by endocrinology.   Orders Placed This Encounter  Procedures  . CBC with Differential    Standing Status: Future     Number of Occurrences:      Standing Expiration Date: 11/16/2014  . Comprehensive metabolic panel (Cmet) - [facility]    Standing Status: Future     Number of Occurrences:      Standing Expiration Date: 11/16/2014   The patient has a good understanding of the overall plan. she agrees with it. She will call with any problems that may develop before her next visit here.  I spent 25 minutes counseling the patient face to face. The total time spent in the appointment was 30 minutes and more than 50% was on counseling and review of test results    [name], MD 11/16/2013 10:12 AM

## 2013-11-16 NOTE — Assessment & Plan Note (Signed)
Left breast cancer stage IIB ER/PR positive HER-2 negative: Patient is starting neoadjuvant chemotherapy with dose dense Adriamycin and Cytoxan. I'm starting her on a slightly lower dosage at 50 per meter square on Adriamycin and 500 per meter square and Cytoxan to assess her tolerability to treatment. I reviewed that chemotherapy regimen once again and she will start treatment today. She will come back tomorrow for Neulasta injection.  Return to clinic in one week for toxicity check CBC with differential and followup with Mendel Ryder.  Multinodular goiter and adrenal nodules: Being followed by endocrinology.

## 2013-11-16 NOTE — Patient Instructions (Addendum)
[facility] Discharge Instructions for Patients Receiving Chemotherapy  Today you received the following chemotherapy agents Adriamycin, Cytoxan  To help prevent nausea and vomiting after your treatment, we encourage you to take your nausea medication Zofran   If you develop nausea and vomiting that is not controlled by your nausea medication, call the clinic.   BELOW ARE SYMPTOMS THAT SHOULD BE REPORTED IMMEDIATELY:  *FEVER GREATER THAN 100.5 F  *CHILLS WITH OR WITHOUT FEVER  NAUSEA AND VOMITING THAT IS NOT CONTROLLED WITH YOUR NAUSEA MEDICATION  *UNUSUAL SHORTNESS OF BREATH  *UNUSUAL BRUISING OR BLEEDING  TENDERNESS IN MOUTH AND THROAT WITH OR WITHOUT PRESENCE OF ULCERS  *URINARY PROBLEMS  *BOWEL PROBLEMS  UNUSUAL RASH Items with * indicate a potential emergency and should be followed up as soon as possible.  Feel free to call the clinic you have any questions or concerns. The clinic phone number is [phone].        Doxorubicin injection What is this medicine? DOXORUBICIN (dox oh ROO bi sin) is a chemotherapy drug. It is used to treat many kinds of cancer like Hodgkin's disease, leukemia, non-Hodgkin's lymphoma, neuroblastoma, sarcoma, and Wilms' tumor. It is also used to treat bladder cancer, breast cancer, lung cancer, ovarian cancer, stomach cancer, and thyroid cancer. This medicine may be used for other purposes; ask your health care provider or pharmacist if you have questions. COMMON BRAND NAME(S): Adriamycin, Adriamycin PFS, Adriamycin RDF, Rubex What should I tell my health care provider before I take this medicine? They need to know if you have any of these conditions: -blood disorders -heart disease, recent heart attack -infection (especially a virus infection such as chickenpox, cold sores, or herpes) -irregular heartbeat -liver disease -recent or ongoing radiation therapy -an unusual or allergic reaction to doxorubicin, other  chemotherapy agents, other medicines, foods, dyes, or preservatives -pregnant or trying to get pregnant -breast-feeding How should I use this medicine? This drug is given as an infusion into a vein. It is administered in a hospital or clinic by a specially trained health care professional. If you have pain, swelling, burning or any unusual feeling around the site of your injection, tell your health care professional right away. Talk to your pediatrician regarding the use of this medicine in children. Special care may be needed. Overdosage: If you think you have taken too much of this medicine contact a poison control center or emergency room at once. NOTE: This medicine is only for you. Do not share this medicine with others. What if I miss a dose? It is important not to miss your dose. Call your doctor or health care professional if you are unable to keep an appointment. What may interact with this medicine? Do not take this medicine with any of the following medications: -cisapride -droperidol -halofantrine -pimozide -zidovudine This medicine may also interact with the following medications: -chloroquine -chlorpromazine -clarithromycin -cyclophosphamide -cyclosporine -erythromycin -medicines for depression, anxiety, or psychotic disturbances -medicines for irregular heart beat like amiodarone, bepridil, dofetilide, encainide, flecainide, propafenone, quinidine -medicines for seizures like ethotoin, fosphenytoin, phenytoin -medicines for nausea, vomiting like dolasetron, ondansetron, palonosetron -medicines to increase blood counts like filgrastim, pegfilgrastim, sargramostim -methadone -methotrexate -pentamidine -progesterone -vaccines -verapamil Talk to your doctor or health care professional before taking any of these medicines: -acetaminophen -aspirin -ibuprofen -ketoprofen -naproxen This list may not describe all possible interactions. Give your health care provider a  list of all the medicines, herbs, non-prescription drugs, or dietary supplements you use. Also tell them if you  smoke, drink alcohol, or use illegal drugs. Some items may interact with your medicine. What should I watch for while using this medicine? Your condition will be monitored carefully while you are receiving this medicine. You will need important blood work done while you are taking this medicine. This drug may make you feel generally unwell. This is not uncommon, as chemotherapy can affect healthy cells as well as cancer cells. Report any side effects. Continue your course of treatment even though you feel ill unless your doctor tells you to stop. Your urine may turn red for a few days after your dose. This is not blood. If your urine is dark or brown, call your doctor. In some cases, you may be given additional medicines to help with side effects. Follow all directions for their use. Call your doctor or health care professional for advice if you get a fever, chills or sore throat, or other symptoms of a cold or flu. Do not treat yourself. This drug decreases your body's ability to fight infections. Try to avoid being around people who are sick. This medicine may increase your risk to bruise or bleed. Call your doctor or health care professional if you notice any unusual bleeding. Be careful brushing and flossing your teeth or using a toothpick because you may get an infection or bleed more easily. If you have any dental work done, tell your dentist you are receiving this medicine. Avoid taking products that contain aspirin, acetaminophen, ibuprofen, naproxen, or ketoprofen unless instructed by your doctor. These medicines may hide a fever. Men and women of childbearing age should use effective birth control methods while using taking this medicine. Do not become pregnant while taking this medicine. There is a potential for serious side effects to an unborn child. Talk to your health care  professional or pharmacist for more information. Do not breast-feed an infant while taking this medicine. Do not let others touch your urine or other body fluids for 5 days after each treatment with this medicine. Caregivers should wear latex gloves to avoid touching body fluids during this time. There is a maximum amount of this medicine you should receive throughout your life. The amount depends on the medical condition being treated and your overall health. Your doctor will watch how much of this medicine you receive in your lifetime. Tell your doctor if you have taken this medicine before. What side effects may I notice from receiving this medicine? Side effects that you should report to your doctor or health care professional as soon as possible: -allergic reactions like skin rash, itching or hives, swelling of the face, lips, or tongue -low blood counts - this medicine may decrease the number of white blood cells, red blood cells and platelets. You may be at increased risk for infections and bleeding. -signs of infection - fever or chills, cough, sore throat, pain or difficulty passing urine -signs of decreased platelets or bleeding - bruising, pinpoint red spots on the skin, black, tarry stools, blood in the urine -signs of decreased red blood cells - unusually weak or tired, fainting spells, lightheadedness -breathing problems -chest pain -fast, irregular heartbeat -mouth sores -nausea, vomiting -pain, swelling, redness at site where injected -pain, tingling, numbness in the hands or feet -swelling of ankles, feet, or hands -unusual bleeding or bruising Side effects that usually do not require medical attention (report to your doctor or health care professional if they continue or are bothersome): -diarrhea -facial flushing -hair loss -loss of appetite -missed menstrual periods -  nail discoloration or damage -red or watery eyes -red colored urine -stomach upset This list may not  describe all possible side effects. Call your doctor for medical advice about side effects. You may report side effects to FDA at 1-800-FDA-1088. Where should I keep my medicine? This drug is given in a hospital or clinic and will not be stored at home. NOTE: This sheet is a summary. It may not cover all possible information. If you have questions about this medicine, talk to your doctor, pharmacist, or health care provider.  2015, [employer]. (2012-06-28 09:54:34)   Cyclophosphamide injection What is this medicine? CYCLOPHOSPHAMIDE (sye kloe FOSS fa mide) is a chemotherapy drug. It slows the growth of cancer cells. This medicine is used to treat many types of cancer like lymphoma, myeloma, leukemia, breast cancer, and ovarian cancer, to name a few. This medicine may be used for other purposes; ask your health care provider or pharmacist if you have questions. COMMON BRAND NAME(S): Cytoxan, Neosar What should I tell my health care provider before I take this medicine? They need to know if you have any of these conditions: -blood disorders -history of other chemotherapy -infection -kidney disease -liver disease -recent or ongoing radiation therapy -tumors in the bone marrow -an unusual or allergic reaction to cyclophosphamide, other chemotherapy, other medicines, foods, dyes, or preservatives -pregnant or trying to get pregnant -breast-feeding How should I use this medicine? This drug is usually given as an injection into a vein or muscle or by infusion into a vein. It is administered in a hospital or clinic by a specially trained health care professional. Talk to your pediatrician regarding the use of this medicine in children. Special care may be needed. Overdosage: If you think you have taken too much of this medicine contact a poison control center or emergency room at once. NOTE: This medicine is only for you. Do not share this medicine with others. What if I miss a  dose? It is important not to miss your dose. Call your doctor or health care professional if you are unable to keep an appointment. What may interact with this medicine? This medicine may interact with the following medications: -amiodarone -amphotericin B -azathioprine -certain antiviral medicines for HIV or AIDS such as protease inhibitors (e.g., indinavir, ritonavir) and zidovudine -certain blood pressure medications such as benazepril, captopril, enalapril, fosinopril, lisinopril, moexipril, monopril, perindopril, quinapril, ramipril, trandolapril -certain cancer medications such as anthracyclines (e.g., daunorubicin, doxorubicin), busulfan, cytarabine, paclitaxel, pentostatin, tamoxifen, trastuzumab -certain diuretics such as chlorothiazide, chlorthalidone, hydrochlorothiazide, indapamide, metolazone -certain medicines that treat or prevent blood clots like warfarin -certain muscle relaxants such as succinylcholine -cyclosporine -etanercept -indomethacin -medicines to increase blood counts like filgrastim, pegfilgrastim, sargramostim -medicines used as general anesthesia -metronidazole -natalizumab This list may not describe all possible interactions. Give your health care provider a list of all the medicines, herbs, non-prescription drugs, or dietary supplements you use. Also tell them if you smoke, drink alcohol, or use illegal drugs. Some items may interact with your medicine. What should I watch for while using this medicine? Visit your doctor for checks on your progress. This drug may make you feel generally unwell. This is not uncommon, as chemotherapy can affect healthy cells as well as cancer cells. Report any side effects. Continue your course of treatment even though you feel ill unless your doctor tells you to stop. Drink water or other fluids as directed. Urinate often, even at night. In some cases, you may be given additional medicines to help  with side effects. Follow all  directions for their use. Call your doctor or health care professional for advice if you get a fever, chills or sore throat, or other symptoms of a cold or flu. Do not treat yourself. This drug decreases your body's ability to fight infections. Try to avoid being around people who are sick. This medicine may increase your risk to bruise or bleed. Call your doctor or health care professional if you notice any unusual bleeding. Be careful brushing and flossing your teeth or using a toothpick because you may get an infection or bleed more easily. If you have any dental work done, tell your dentist you are receiving this medicine. You may get drowsy or dizzy. Do not drive, use machinery, or do anything that needs mental alertness until you know how this medicine affects you. Do not become pregnant while taking this medicine or for 1 year after stopping it. Women should inform their doctor if they wish to become pregnant or think they might be pregnant. Men should not father a child while taking this medicine and for 4 months after stopping it. There is a potential for serious side effects to an unborn child. Talk to your health care professional or pharmacist for more information. Do not breast-feed an infant while taking this medicine. This medicine may interfere with the ability to have a child. This medicine has caused ovarian failure in some women. This medicine has caused reduced sperm counts in some men. You should talk with your doctor or health care professional if you are concerned about your fertility. If you are going to have surgery, tell your doctor or health care professional that you have taken this medicine. What side effects may I notice from receiving this medicine? Side effects that you should report to your doctor or health care professional as soon as possible: -allergic reactions like skin rash, itching or hives, swelling of the face, lips, or tongue -low blood counts - this medicine may  decrease the number of white blood cells, red blood cells and platelets. You may be at increased risk for infections and bleeding. -signs of infection - fever or chills, cough, sore throat, pain or difficulty passing urine -signs of decreased platelets or bleeding - bruising, pinpoint red spots on the skin, black, tarry stools, blood in the urine -signs of decreased red blood cells - unusually weak or tired, fainting spells, lightheadedness -breathing problems -dark urine -dizziness -palpitations -swelling of the ankles, feet, hands -trouble passing urine or change in the amount of urine -weight gain -yellowing of the eyes or skin Side effects that usually do not require medical attention (report to your doctor or health care professional if they continue or are bothersome): -changes in nail or skin color -hair loss -missed menstrual periods -mouth sores -nausea, vomiting This list may not describe all possible side effects. Call your doctor for medical advice about side effects. You may report side effects to FDA at 1-800-FDA-1088. Where should I keep my medicine? This drug is given in a hospital or clinic and will not be stored at home. NOTE: This sheet is a summary. It may not cover all possible information. If you have questions about this medicine, talk to your doctor, pharmacist, or health care provider.  2015, [employer] Standard. (2012-01-15 16:22:58)

## 2013-11-17 ENCOUNTER — Telehealth: Payer: Self-pay | Admitting: Adult Health

## 2013-11-17 ENCOUNTER — Ambulatory Visit (HOSPITAL_BASED_OUTPATIENT_CLINIC_OR_DEPARTMENT_OTHER): Payer: Medicare Other

## 2013-11-17 ENCOUNTER — Telehealth: Payer: Self-pay | Admitting: *Deleted

## 2013-11-17 VITALS — BP 138/60 | HR 79 | Temp 98.2°F

## 2013-11-17 DIAGNOSIS — C50419 Malignant neoplasm of upper-outer quadrant of unspecified female breast: Secondary | ICD-10-CM

## 2013-11-17 DIAGNOSIS — Z5189 Encounter for other specified aftercare: Secondary | ICD-10-CM

## 2013-11-17 DIAGNOSIS — C50412 Malignant neoplasm of upper-outer quadrant of left female breast: Secondary | ICD-10-CM

## 2013-11-17 MED ORDER — PEGFILGRASTIM INJECTION 6 MG/0.6ML
6.0000 mg | Freq: Once | SUBCUTANEOUS | Status: AC
  Administered 2013-11-17: 6 mg via SUBCUTANEOUS
  Filled 2013-11-17: qty 0.6

## 2013-11-17 NOTE — Telephone Encounter (Signed)
gv adn printed appt sched and avs for pt for Sept.... °

## 2013-11-17 NOTE — Telephone Encounter (Signed)
Whitney [name] here for Neulasta injection following 1st AC chemo treatment.  States that she is doing well.  No nausea, vomiting or diarrhea.  Is drinking some fluids which I encouraged her to increase.  All questions answered.  Knows to call the office if she has any problems or concerns.

## 2013-11-17 NOTE — Addendum Note (Signed)
Addendum created 11/17/13 1418 by Kate Sable, MD   Modules edited: Anesthesia Attestations

## 2013-11-17 NOTE — Patient Instructions (Signed)
Use Claritin 10 mg daily for 5-7 days.  Use Aleve for bone aches.  Pegfilgrastim injection What is this medicine? PEGFILGRASTIM (peg fil GRA stim) is a long-acting granulocyte colony-stimulating factor that stimulates the growth of neutrophils, a type of white blood cell important in the body's fight against infection. It is used to reduce the incidence of fever and infection in patients with certain types of cancer who are receiving chemotherapy that affects the bone marrow. This medicine may be used for other purposes; ask your health care provider or pharmacist if you have questions. COMMON BRAND NAME(S): Neulasta What should I tell my health care provider before I take this medicine? They need to know if you have any of these conditions: -latex allergy -ongoing radiation therapy -sickle cell disease -skin reactions to acrylic adhesives (On-Body Injector only) -an unusual or allergic reaction to pegfilgrastim, filgrastim, other medicines, foods, dyes, or preservatives -pregnant or trying to get pregnant -breast-feeding How should I use this medicine? This medicine is for injection under the skin. If you get this medicine at home, you will be taught how to prepare and give the pre-filled syringe or how to use the On-body Injector. Refer to the patient Instructions for Use for detailed instructions. Use exactly as directed. Take your medicine at regular intervals. Do not take your medicine more often than directed. It is important that you put your used needles and syringes in a special sharps container. Do not put them in a trash can. If you do not have a sharps container, call your pharmacist or healthcare provider to get one. Talk to your pediatrician regarding the use of this medicine in children. Special care may be needed. Overdosage: If you think you have taken too much of this medicine contact a poison control center or emergency room at once. NOTE: This medicine is only for you. Do not  share this medicine with others. What if I miss a dose? It is important not to miss your dose. Call your doctor or health care professional if you miss your dose. If you miss a dose due to an On-body Injector failure or leakage, a new dose should be administered as soon as possible using a single prefilled syringe for manual use. What may interact with this medicine? Interactions have not been studied. Give your health care provider a list of all the medicines, herbs, non-prescription drugs, or dietary supplements you use. Also tell them if you smoke, drink alcohol, or use illegal drugs. Some items may interact with your medicine. This list may not describe all possible interactions. Give your health care provider a list of all the medicines, herbs, non-prescription drugs, or dietary supplements you use. Also tell them if you smoke, drink alcohol, or use illegal drugs. Some items may interact with your medicine. What should I watch for while using this medicine? You may need blood work done while you are taking this medicine. If you are going to need a MRI, CT scan, or other procedure, tell your doctor that you are using this medicine (On-Body Injector only). What side effects may I notice from receiving this medicine? Side effects that you should report to your doctor or health care professional as soon as possible: -allergic reactions like skin rash, itching or hives, swelling of the face, lips, or tongue -dizziness -fever -pain, redness, or irritation at site where injected -pinpoint red spots on the skin -shortness of breath or breathing problems -stomach or side pain, or pain at the shoulder -swelling -tiredness -trouble  passing urine Side effects that usually do not require medical attention (report to your doctor or health care professional if they continue or are bothersome): -bone pain -muscle pain This list may not describe all possible side effects. Call your doctor for medical  advice about side effects. You may report side effects to FDA at 1-800-FDA-1088. Where should I keep my medicine? Keep out of the reach of children. Store pre-filled syringes in a refrigerator between 2 and 8 degrees C (36 and 46 degrees F). Do not freeze. Keep in carton to protect from light. Throw away this medicine if it is left out of the refrigerator for more than 48 hours. Throw away any unused medicine after the expiration date. NOTE: This sheet is a summary. It may not cover all possible information. If you have questions about this medicine, talk to your doctor, pharmacist, or health care provider.  2015, [employer]. (2013-06-01 16:14:05)

## 2013-11-24 ENCOUNTER — Encounter: Payer: Self-pay | Admitting: Adult Health

## 2013-11-24 ENCOUNTER — Ambulatory Visit (HOSPITAL_BASED_OUTPATIENT_CLINIC_OR_DEPARTMENT_OTHER): Payer: Medicare Other | Admitting: Adult Health

## 2013-11-24 ENCOUNTER — Other Ambulatory Visit (HOSPITAL_BASED_OUTPATIENT_CLINIC_OR_DEPARTMENT_OTHER): Payer: Medicare Other

## 2013-11-24 ENCOUNTER — Telehealth: Payer: Self-pay | Admitting: *Deleted

## 2013-11-24 ENCOUNTER — Telehealth: Payer: Self-pay | Admitting: Adult Health

## 2013-11-24 VITALS — BP 150/69 | HR 67 | Temp 99.1°F | Resp 20 | Ht 67.0 in | Wt 204.8 lb

## 2013-11-24 DIAGNOSIS — Z170 Estrogen receptor positive status [ER+]: Secondary | ICD-10-CM

## 2013-11-24 DIAGNOSIS — C50419 Malignant neoplasm of upper-outer quadrant of unspecified female breast: Secondary | ICD-10-CM

## 2013-11-24 DIAGNOSIS — C50412 Malignant neoplasm of upper-outer quadrant of left female breast: Secondary | ICD-10-CM

## 2013-11-24 LAB — COMPREHENSIVE METABOLIC PANEL (CC13)
ALT: 11 U/L (ref 0–55)
AST: 8 U/L (ref 5–34)
Albumin: 3.2 g/dL — ABNORMAL LOW (ref 3.5–5.0)
Alkaline Phosphatase: 63 U/L (ref 40–150)
Anion Gap: 11 mEq/L (ref 3–11)
BUN: 36.2 mg/dL — ABNORMAL HIGH (ref 7.0–26.0)
CO2: 26 mEq/L (ref 22–29)
Calcium: 9.5 mg/dL (ref 8.4–10.4)
Chloride: 101 mEq/L (ref 98–109)
Creatinine: 0.9 mg/dL (ref 0.6–1.1)
Glucose: 178 mg/dl — ABNORMAL HIGH (ref 70–140)
Potassium: 4.1 mEq/L (ref 3.5–5.1)
Sodium: 138 mEq/L (ref 136–145)
Total Bilirubin: 0.48 mg/dL (ref 0.20–1.20)
Total Protein: 5.8 g/dL — ABNORMAL LOW (ref 6.4–8.3)

## 2013-11-24 LAB — CBC WITH DIFFERENTIAL/PLATELET
BASO%: 0.1 % (ref 0.0–2.0)
Basophils Absolute: 0 10*3/uL (ref 0.0–0.1)
EOS%: 0.1 % (ref 0.0–7.0)
Eosinophils Absolute: 0 10*3/uL (ref 0.0–0.5)
HCT: 34.1 % — ABNORMAL LOW (ref 34.8–46.6)
HGB: 11 g/dL — ABNORMAL LOW (ref 11.6–15.9)
LYMPH%: 7.8 % — ABNORMAL LOW (ref 14.0–49.7)
MCH: 27.8 pg (ref 25.1–34.0)
MCHC: 32.1 g/dL (ref 31.5–36.0)
MCV: 86.6 fL (ref 79.5–101.0)
MONO#: 0.3 10*3/uL (ref 0.1–0.9)
MONO%: 10.9 % (ref 0.0–14.0)
NEUT#: 2.3 10*3/uL (ref 1.5–6.5)
NEUT%: 81.1 % — ABNORMAL HIGH (ref 38.4–76.8)
Platelets: 164 10*3/uL (ref 145–400)
RBC: 3.94 10*6/uL (ref 3.70–5.45)
RDW: 15.8 % — ABNORMAL HIGH (ref 11.2–14.5)
WBC: 2.9 10*3/uL — ABNORMAL LOW (ref 3.9–10.3)
lymph#: 0.2 10*3/uL — ABNORMAL LOW (ref 0.9–3.3)

## 2013-11-24 NOTE — Patient Instructions (Signed)
You are doing well your labs look great.  We will see you next week for your next treatment.  Please call us if you have any questions or concerns.

## 2013-11-24 NOTE — Telephone Encounter (Signed)
Per staff message and POF I have scheduled appts. Also adjusted 9/17 appt    Advised scheduler of appts. JMW

## 2013-11-24 NOTE — Progress Notes (Signed)
Patient Care Team: [name], MD as PCP - General (Family Medicine) [name], MD as Consulting Physician (Hematology and Oncology) [name], MD as Consulting Physician (General Surgery) [name], MD as Consulting Physician (Radiation Oncology)  DIAGNOSIS: Breast cancer of upper-outer quadrant of right female breast   Primary site: Breast (Right)   Staging method: AJCC 7th Edition   Clinical: Stage IIB (T2, N1, cM0) signed by [name], MD on 11/01/2013  8:37 AM   Summary: Stage IIB (T2, N1, cM0)   SUMMARY OF ONCOLOGIC HISTORY:   Breast cancer of upper-outer quadrant of right female breast   10/20/2013 Mammogram Ultrasound and mammogram showed 2.1 x 2.1 x 1.9 cm right breast mass   10/20/2013 Initial Diagnosis Breast cancer of upper-outer quadrant of right female breast. Invasive ductal cancer with lymphovascular invasion one lymph node biopsy that was positive for cancer grade 1; Her 2 Neg Ratio 0.96, ER100% PR 8% positive Ki 67: 11%;   10/31/2013 Breast MRI Right breast upper outer quadrant: 3.1 x 1.9 x 2.2 cm: Right axillary lymph node 1.1 x 0.9 x 0.6 cm   11/16/2013 -  Chemotherapy Neoadjuvant dose dense Doxorubicin and Cyclophosphamide given on day 1 of a 14 day cycle with Neulasta given on day 2 for granulocyte support    CHIEF COMPLIANT: Patient is here for chemotherapy cycle 1 day 8 of dose dense Adriamycin and Cytoxan  INTERVAL HISTORY: Mrs. Garrison is an 80 year old with above-mentioned history of stage II B. clinical stage invasive ductal carcinoma left breast with lymphovascular invasion with one positive lymph node.   Mrs. Harlacher is doing remarkably well today.  She is cycle 1 day 8 of neoadjuvant treatment with chemotherapy.  She does feel slightly weak, but denies fevers, chills, nausea, vomiting, constipation, diarrhea, numbness/tingling, nail changes.  She did receive Neulasta on day 2 and had some mild pain the following day in her chest wall that  resolved with aspirin.  Her port is not bothering her, and she is not having any problems with it.    REVIEW OF SYSTEMS:   A 10 point review of systems was conducted and is otherwise negative except for what is noted above.     Past Medical History  Diagnosis Date  . Hypertension   . Hyperlipidemia   . Diabetes mellitus without complication   . Thyroid disease     hypothyroidism  . Anemia   . Former smoker   . Multinodular goiter   . Complication of anesthesia     slow to wake up  . Cancer     right breast   Past Surgical History  Procedure Laterality Date  . Eye surgery  12/22/2009  . Abdominal hysterectomy    . Portacath placement N/A 11/15/2013    Procedure: INSERTION PORT-A-CATH WITH ULTRA SOUND AND FLOROSCOPY;  Surgeon: [name], MD;  Location: [facility];  Service: General;  Laterality: N/A;   History reviewed. No pertinent family history.   ALLERGIES:  has No Known Allergies.  MEDICATIONS:  Current Outpatient Prescriptions  Medication Sig Dispense Refill  . aspirin 81 MG tablet Take 81 mg by mouth daily.      Marland Kitchen atorvastatin (LIPITOR) 10 MG tablet Take 10 mg by mouth daily.      . Cholecalciferol (VITAMIN D) 2000 UNITS CAPS Take 2,000 Units by mouth daily.      Marland Kitchen dexamethasone (DECADRON) 4 MG tablet Take 2 tablets by mouth once a day on the day after chemotherapy and  then take 2 tablets two times a day for 2 days. Take with food.  30 tablet  1  . ferrous sulfate 325 (65 FE) MG EC tablet Take 325 mg by mouth daily with breakfast.      . furosemide (LASIX) 40 MG tablet Take 1-2 tablets daily as needed      . glucose blood test strip 1 each by Other route as needed for other (One Touch Ultra). Use as instructed      . levothyroxine (SYNTHROID, LEVOTHROID) 50 MCG tablet Take 1 tablet (50 mcg total) by mouth daily before breakfast.  90 tablet  0  . lidocaine-prilocaine (EMLA) cream Apply 1 application topically as needed.  30 g  0  . lisinopril-hydrochlorothiazide  (PRINZIDE,ZESTORETIC) 20-12.5 MG per tablet Take 1 tablet by mouth daily.      Marland Kitchen LORazepam (ATIVAN) 0.5 MG tablet Take 1 tablet (0.5 mg total) by mouth every 6 (six) hours as needed (Nausea or vomiting).  30 tablet  0  . metFORMIN (GLUCOPHAGE-XR) 500 MG 24 hr tablet Take 500-1,000 mg by mouth 2 (two) times daily. Take 500 mg every morning and 1000 mg every evening      . metoprolol succinate (TOPROL-XL) 25 MG 24 hr tablet Take 1 tablet (25 mg total) by mouth daily.  90 tablet  0  . ondansetron (ZOFRAN) 8 MG tablet Take 1 tablet (8 mg total) by mouth 2 (two) times daily as needed. Start on the third day after chemotherapy.  30 tablet  1  . pioglitazone (ACTOS) 30 MG tablet Take 1 tablet (30 mg total) by mouth daily.  90 tablet  0  . Polyethyl Glycol-Propyl Glycol (SYSTANE OP) Place 1 drop into both eyes 3 (three) times daily.      . vitamin B-12 (CYANOCOBALAMIN) 1000 MCG tablet Take 1,000 mcg by mouth daily.      Marland Kitchen dexamethasone (DECADRON) 1 MG tablet at bedtime before test  1 tablet  0  . HYDROcodone-acetaminophen (NORCO) 5-325 MG per tablet Take 1 tablet by mouth every 6 (six) hours as needed for moderate pain.  30 tablet  0  . potassium chloride SA (K-DUR,KLOR-CON) 20 MEQ tablet Take 20 mEq by mouth daily.      . prochlorperazine (COMPAZINE) 10 MG tablet Take 1 tablet (10 mg total) by mouth every 6 (six) hours as needed (Nausea or vomiting).  30 tablet  1   No current facility-administered medications for this visit.    PHYSICAL EXAMINATION:   Filed Vitals:   11/24/13 0840  BP: 150/69  Pulse: 67  Temp: 99.1 F (37.3 C)  Resp: 20   Filed Weights   11/24/13 0840  Weight: 204 lb 12.8 oz (92.897 kg)   GENERAL: Patient is a well appearing female in no acute distress HEENT:  Sclerae anicteric.  Oropharynx clear and moist. No ulcerations or evidence of oropharyngeal candidiasis. Neck is supple.  NODES:  No cervical, supraclavicular, or axillary lymphadenopathy palpated.  BREAST EXAM:   Soft mass palpable in right upper outer quadrant LUNGS:  Clear to auscultation bilaterally.  No wheezes or rhonchi. HEART:  Regular rate and rhythm. No murmur appreciated. ABDOMEN:  Soft, nontender.  Positive, normoactive bowel sounds. No organomegaly palpated. MSK:  No focal spinal tenderness to palpation. Full range of motion bilaterally in the upper extremities. EXTREMITIES:  No peripheral edema.   SKIN:  Clear with no obvious rashes or skin changes. No nail dyscrasia. NEURO:  Nonfocal. Well oriented.  Appropriate affect. ECOG PERFORMANCE STATUS: 0 -  Asymptomatic   LABORATORY DATA:  I have reviewed the data as listed   Chemistry      Component Value Date/Time   NA 138 11/24/2013 0811   NA 140 11/13/2013 1540   K 4.1 11/24/2013 0811   K 3.8 11/13/2013 1540   CL 100 11/13/2013 1540   CO2 26 11/24/2013 0811   CO2 28 11/13/2013 1540   BUN 36.2* 11/24/2013 0811   BUN 27* 11/13/2013 1540   CREATININE 0.9 11/24/2013 0811   CREATININE 1.34* 11/13/2013 1540   CREATININE 0.88 07/27/2013 0913      Component Value Date/Time   CALCIUM 9.5 11/24/2013 0811   CALCIUM 10.2 11/13/2013 1540   ALKPHOS 63 11/24/2013 0811   ALKPHOS 61 11/13/2013 1540   AST 8 11/24/2013 0811   AST 14 11/13/2013 1540   ALT 11 11/24/2013 0811   ALT 9 11/13/2013 1540   BILITOT 0.48 11/24/2013 0811   BILITOT 0.3 11/13/2013 1540       Lab Results  Component Value Date   WBC 2.9* 11/24/2013   HGB 11.0* 11/24/2013   HCT 34.1* 11/24/2013   MCV 86.6 11/24/2013   PLT 164 11/24/2013   NEUTROABS 2.3 11/24/2013     RADIOGRAPHIC STUDIES: CLINICAL DATA: Recently diagnosed right breast invasive mammary  carcinoma. Preop evaluation.  LABS: BUN and creatinine were obtained on site at [facility] at  [address]  Results: BUN 20 mg/dL, Creatinine 0.8 mg/dL.  EXAM:  BILATERAL BREAST MRI WITH AND WITHOUT CONTRAST  TECHNIQUE:  Multiplanar, multisequence MR images of both breasts were obtained  prior to and following the  intravenous administration of 54m of  MultiHance  THREE-DIMENSIONAL MR IMAGE RENDERING ON INDEPENDENT WORKSTATION:  Three-dimensional MR images were rendered by post-processing of the  original MR data on an independent workstation. The  three-dimensional MR images were interpreted, and findings are  reported in the following complete MRI report for this study. Three  dimensional images were evaluated at the independent DynaCad  workstation  COMPARISON: Previous mammograms dated 10/20/2013, 10/06/2013.  Previous ultrasound images dated 10/20/2013.  FINDINGS:  Breast composition: b. Scattered fibroglandular tissue.  Background parenchymal enhancement: Mild  Right breast: There is an irregular enhancing mass located within  the upper outer quadrant of the right breast (posterior 1/3) which  corresponds to recently diagnosed right breast invasive mammary  carcinoma. Clip artifact is seen along the lateral posterior border  of this mass. The mass measures 3.1 x 1.9 x 2.2 cm in size and is  associated with a mixture of plateau and wash -in /washout  enhancement kinetics. There are no additional worrisome enhancing  foci within the right breast.  Left breast: No mass or abnormal enhancement.  Lymph nodes: There is level 1 right axillary lymph node present  measuring 1.1 x 0.9 x 0.6 cm in size and associated with mild  decrease in central hilar fat. Most likely this corresponds to the  recently biopsied right axillary lymph node (under ultrasound  guidance). There are no additional abnormal appearing lymph nodes.  Ancillary findings: None.  IMPRESSION:  3.1 cm irregular enhancing mass located within the upper outer  quadrant of the right breast correspond to the recently diagnosed  right breast invasive mammary carcinoma. There is a slightly  abnormal appearing level 1 right axillary lymph node present which  most likely corresponds to the lymph node recently biopsied under  ultrasound  guidance. No additional findings.  RECOMMENDATION:  Treatment plan.  BI-RADS CATEGORY 6:  Known biopsy-proven malignancy.  Electronically Signed  By: Luberta Robertson M.D.  On: 10/31/2013 12:04   ASSESSMENT:    80 year old woman with   1. Clinical T2N1, stage IIB, invasive ductal carcinoma, grade 1, ER 100%, PR 8%, Ki-67 11%, HER-2/neu negative.    2. Patient was started on neoadjuvant treatment of Doxorubicin and cylcophosphamide.  She receives this day 1 of a 14 day cycle with Neulasta given on day 2 for granulocyte support.  After this we have planned on her receiving Paclitaxel, weekly x 12 weeks.  PLAN:  Kember Boch is doing remarkably well following her first cycle of Doxorubicin and Cyclophosphamide.  Her labs though slightly lower, are stable from prior to treatment.  I reviewed her lab work with her in detail.  She tolerated her first treatment and Neulasta remarkably well.    We reviewed her breast cancer diagnosis, along with what her pathology and ER positivity and meaning in detail.    Bevin is doing well today and she will return in one week for labs, an appointment, and her second cycle of Doxorubicin and Cyclophosphamide.   She knows to call us in the interim for any questions or concerns.  We can certainly see her sooner if needed.   I spent 25 minutes counseling the patient face to face. The total time spent in the appointment was 30 minutes and more than 50% was on counseling and review of test results   Minette Headland, [place] [phone] 11/24/2013 9:58 AM

## 2013-11-29 ENCOUNTER — Telehealth: Payer: Self-pay | Admitting: Adult Health

## 2013-11-29 ENCOUNTER — Other Ambulatory Visit: Payer: Self-pay | Admitting: *Deleted

## 2013-11-29 DIAGNOSIS — C50411 Malignant neoplasm of upper-outer quadrant of right female breast: Secondary | ICD-10-CM

## 2013-11-29 NOTE — Telephone Encounter (Signed)
S/w pt advised appt chg on 10/23 from [facility] to HF @ 11.15am due to [facility] out of office. Pt says she will collect a new schedule when she comes in tomorrow.

## 2013-11-30 ENCOUNTER — Ambulatory Visit (HOSPITAL_BASED_OUTPATIENT_CLINIC_OR_DEPARTMENT_OTHER): Payer: Medicare Other

## 2013-11-30 ENCOUNTER — Other Ambulatory Visit (HOSPITAL_BASED_OUTPATIENT_CLINIC_OR_DEPARTMENT_OTHER): Payer: Medicare Other

## 2013-11-30 ENCOUNTER — Telehealth: Payer: Self-pay | Admitting: Hematology and Oncology

## 2013-11-30 ENCOUNTER — Encounter: Payer: Self-pay | Admitting: Adult Health

## 2013-11-30 ENCOUNTER — Other Ambulatory Visit: Payer: Medicare Other

## 2013-11-30 ENCOUNTER — Ambulatory Visit (HOSPITAL_BASED_OUTPATIENT_CLINIC_OR_DEPARTMENT_OTHER): Payer: Medicare Other | Admitting: Adult Health

## 2013-11-30 VITALS — BP 150/65 | HR 71 | Temp 98.4°F | Resp 20 | Ht 67.0 in | Wt 199.1 lb

## 2013-11-30 DIAGNOSIS — C50411 Malignant neoplasm of upper-outer quadrant of right female breast: Secondary | ICD-10-CM

## 2013-11-30 DIAGNOSIS — C50419 Malignant neoplasm of upper-outer quadrant of unspecified female breast: Secondary | ICD-10-CM

## 2013-11-30 DIAGNOSIS — Z170 Estrogen receptor positive status [ER+]: Secondary | ICD-10-CM

## 2013-11-30 DIAGNOSIS — Z5111 Encounter for antineoplastic chemotherapy: Secondary | ICD-10-CM

## 2013-11-30 LAB — COMPREHENSIVE METABOLIC PANEL
ALT: 11 U/L (ref 0–35)
AST: 13 U/L (ref 0–37)
Albumin: 3.3 g/dL — ABNORMAL LOW (ref 3.5–5.2)
Alkaline Phosphatase: 74 U/L (ref 39–117)
BUN: 25 mg/dL — ABNORMAL HIGH (ref 6–23)
CO2: 30 mEq/L (ref 19–32)
Calcium: 10 mg/dL (ref 8.4–10.5)
Chloride: 100 mEq/L (ref 96–112)
Creatinine, Ser: 0.89 mg/dL (ref 0.50–1.10)
Glucose, Bld: 150 mg/dL — ABNORMAL HIGH (ref 70–99)
Potassium: 3.7 mEq/L (ref 3.5–5.3)
Sodium: 141 mEq/L (ref 135–145)
Total Bilirubin: 0.3 mg/dL (ref 0.2–1.2)
Total Protein: 6 g/dL (ref 6.0–8.3)

## 2013-11-30 LAB — CBC WITH DIFFERENTIAL/PLATELET
BASO%: 0.4 % (ref 0.0–2.0)
Basophils Absolute: 0.1 10*3/uL (ref 0.0–0.1)
EOS%: 0 % (ref 0.0–7.0)
Eosinophils Absolute: 0 10*3/uL (ref 0.0–0.5)
HCT: 34.1 % — ABNORMAL LOW (ref 34.8–46.6)
HGB: 11.3 g/dL — ABNORMAL LOW (ref 11.6–15.9)
LYMPH%: 8.7 % — ABNORMAL LOW (ref 14.0–49.7)
MCH: 28.5 pg (ref 25.1–34.0)
MCHC: 33.1 g/dL (ref 31.5–36.0)
MCV: 86.1 fL (ref 79.5–101.0)
MONO#: 0.6 10*3/uL (ref 0.1–0.9)
MONO%: 5 % (ref 0.0–14.0)
NEUT#: 10.9 10*3/uL — ABNORMAL HIGH (ref 1.5–6.5)
NEUT%: 85.9 % — ABNORMAL HIGH (ref 38.4–76.8)
Platelets: 117 10*3/uL — ABNORMAL LOW (ref 145–400)
RBC: 3.96 10*6/uL (ref 3.70–5.45)
RDW: 16.5 % — ABNORMAL HIGH (ref 11.2–14.5)
WBC: 12.7 10*3/uL — ABNORMAL HIGH (ref 3.9–10.3)
lymph#: 1.1 10*3/uL (ref 0.9–3.3)
nRBC: 1 % — ABNORMAL HIGH (ref 0–0)

## 2013-11-30 MED ORDER — DOXORUBICIN HCL CHEMO IV INJECTION 2 MG/ML
50.0000 mg/m2 | Freq: Once | INTRAVENOUS | Status: AC
  Administered 2013-11-30: 104 mg via INTRAVENOUS
  Filled 2013-11-30: qty 52

## 2013-11-30 MED ORDER — SODIUM CHLORIDE 0.9 % IJ SOLN
10.0000 mL | INTRAMUSCULAR | Status: DC | PRN
  Administered 2013-11-30: 10 mL
  Filled 2013-11-30: qty 10

## 2013-11-30 MED ORDER — SODIUM CHLORIDE 0.9 % IV SOLN
150.0000 mg | Freq: Once | INTRAVENOUS | Status: AC
  Administered 2013-11-30: 150 mg via INTRAVENOUS
  Filled 2013-11-30: qty 5

## 2013-11-30 MED ORDER — SODIUM CHLORIDE 0.9 % IV SOLN
500.0000 mg/m2 | Freq: Once | INTRAVENOUS | Status: AC
  Administered 2013-11-30: 1040 mg via INTRAVENOUS
  Filled 2013-11-30: qty 52

## 2013-11-30 MED ORDER — PALONOSETRON HCL INJECTION 0.25 MG/5ML
INTRAVENOUS | Status: AC
  Filled 2013-11-30: qty 5

## 2013-11-30 MED ORDER — DEXAMETHASONE SODIUM PHOSPHATE 20 MG/5ML IJ SOLN
INTRAMUSCULAR | Status: AC
  Filled 2013-11-30: qty 5

## 2013-11-30 MED ORDER — PALONOSETRON HCL INJECTION 0.25 MG/5ML
0.2500 mg | Freq: Once | INTRAVENOUS | Status: AC
  Administered 2013-11-30: 0.25 mg via INTRAVENOUS

## 2013-11-30 MED ORDER — HEPARIN SOD (PORK) LOCK FLUSH 100 UNIT/ML IV SOLN
500.0000 [IU] | Freq: Once | INTRAVENOUS | Status: AC | PRN
  Administered 2013-11-30: 500 [IU]
  Filled 2013-11-30: qty 5

## 2013-11-30 MED ORDER — DEXAMETHASONE SODIUM PHOSPHATE 20 MG/5ML IJ SOLN
12.0000 mg | Freq: Once | INTRAMUSCULAR | Status: AC
  Administered 2013-11-30: 12 mg via INTRAVENOUS

## 2013-11-30 MED ORDER — SODIUM CHLORIDE 0.9 % IV SOLN
Freq: Once | INTRAVENOUS | Status: AC
  Administered 2013-11-30: 13:00:00 via INTRAVENOUS

## 2013-11-30 NOTE — Telephone Encounter (Signed)
, °

## 2013-11-30 NOTE — Progress Notes (Signed)
Per Charlestine Massed, NP okay to proceed with treatment without CMET results from 11/30/13.

## 2013-11-30 NOTE — Progress Notes (Signed)
Patient Care Team: [name], MD as PCP - General (Family Medicine) [name], MD as Consulting Physician (Hematology and Oncology) [name], MD as Consulting Physician (General Surgery) [name], MD as Consulting Physician (Radiation Oncology)  DIAGNOSIS: Breast cancer of upper-outer quadrant of right female breast   Primary site: Breast (Right)   Staging method: AJCC 7th Edition   Clinical: Stage IIB (T2, N1, cM0) signed by [name], MD on 11/01/2013  8:37 AM   Summary: Stage IIB (T2, N1, cM0)   SUMMARY OF ONCOLOGIC HISTORY:   Breast cancer of upper-outer quadrant of right female breast   10/20/2013 Mammogram Ultrasound and mammogram showed 2.1 x 2.1 x 1.9 cm right breast mass   10/20/2013 Initial Diagnosis Breast cancer of upper-outer quadrant of right female breast. Invasive ductal cancer with lymphovascular invasion one lymph node biopsy that was positive for cancer grade 1; Her 2 Neg Ratio 0.96, ER100% PR 8% positive Ki 67: 11%;   10/31/2013 Breast MRI Right breast upper outer quadrant: 3.1 x 1.9 x 2.2 cm: Right axillary lymph node 1.1 x 0.9 x 0.6 cm   11/16/2013 -  Chemotherapy Neoadjuvant dose dense Doxorubicin and Cyclophosphamide given on day 1 of a 14 day cycle with Neulasta given on day 2 for granulocyte support    CHIEF COMPLIANT: Patient is here for chemotherapy cycle 2 day 1 of dose dense Adriamycin and Cytoxan  INTERVAL HISTORY: Whitney [name] is an 80 year old with above-mentioned history of stage II B. clinical stage invasive ductal carcinoma left breast with lymphovascular invasion with one positive lymph node.   Whitney [name] is doing remarkably well today.  She is here to be evaluated prior to cycle 2 day 1 of Doxorubicin and Cyclophosphamide.  She is feeling very well.  She is slightly fatigued, but denies fever, chills, nausea, vomiting, constipation, diarrhea, numbness/tingling, skin changes, chest pain, shortness of breath, DOE, orthopnea, or any  further concerns.  She does have bilateral lower extremity swelling which is longstanding and unchanged.    REVIEW OF SYSTEMS:   A 10 point review of systems was conducted and is otherwise negative except for what is noted above.     Past Medical History  Diagnosis Date  . Hypertension   . Hyperlipidemia   . Diabetes mellitus without complication   . Thyroid disease     hypothyroidism  . Anemia   . Former smoker   . Multinodular goiter   . Complication of anesthesia     slow to wake up  . Cancer     right breast   Past Surgical History  Procedure Laterality Date  . Eye surgery  12/22/2009  . Abdominal hysterectomy    . Portacath placement N/A 11/15/2013    Procedure: INSERTION PORT-A-CATH WITH ULTRA SOUND AND FLOROSCOPY;  Surgeon: [name], MD;  Location: [place];  Service: General;  Laterality: N/A;   History reviewed. No pertinent family history.   ALLERGIES:  has No Known Allergies.  MEDICATIONS:  Current Outpatient Prescriptions  Medication Sig Dispense Refill  . aspirin 81 MG tablet Take 81 mg by mouth daily.      Marland Kitchen atorvastatin (LIPITOR) 10 MG tablet Take 10 mg by mouth daily.      . Cholecalciferol (VITAMIN D) 2000 UNITS CAPS Take 2,000 Units by mouth daily.      Marland Kitchen dexamethasone (DECADRON) 1 MG tablet at bedtime before test  1 tablet  0  . dexamethasone (DECADRON) 4 MG tablet Take 2 tablets by  mouth once a day on the day after chemotherapy and then take 2 tablets two times a day for 2 days. Take with food.  30 tablet  1  . ferrous sulfate 325 (65 FE) MG EC tablet Take 325 mg by mouth daily with breakfast.      . furosemide (LASIX) 40 MG tablet Take 1-2 tablets daily as needed      . glucose blood test strip 1 each by Other route as needed for other (One Touch Ultra). Use as instructed      . HYDROcodone-acetaminophen (NORCO) 5-325 MG per tablet Take 1 tablet by mouth every 6 (six) hours as needed for moderate pain.  30 tablet  0  . levothyroxine (SYNTHROID,  LEVOTHROID) 50 MCG tablet Take 1 tablet (50 mcg total) by mouth daily before breakfast.  90 tablet  0  . lidocaine-prilocaine (EMLA) cream Apply 1 application topically as needed.  30 g  0  . lisinopril-hydrochlorothiazide (PRINZIDE,ZESTORETIC) 20-12.5 MG per tablet Take 1 tablet by mouth daily.      Marland Kitchen LORazepam (ATIVAN) 0.5 MG tablet Take 1 tablet (0.5 mg total) by mouth every 6 (six) hours as needed (Nausea or vomiting).  30 tablet  0  . metFORMIN (GLUCOPHAGE-XR) 500 MG 24 hr tablet Take 500-1,000 mg by mouth 2 (two) times daily. Take 500 mg every morning and 1000 mg every evening      . metoprolol succinate (TOPROL-XL) 25 MG 24 hr tablet Take 1 tablet (25 mg total) by mouth daily.  90 tablet  0  . ondansetron (ZOFRAN) 8 MG tablet Take 1 tablet (8 mg total) by mouth 2 (two) times daily as needed. Start on the third day after chemotherapy.  30 tablet  1  . pioglitazone (ACTOS) 30 MG tablet Take 1 tablet (30 mg total) by mouth daily.  90 tablet  0  . Polyethyl Glycol-Propyl Glycol (SYSTANE OP) Place 1 drop into both eyes 3 (three) times daily.      . potassium chloride SA (K-DUR,KLOR-CON) 20 MEQ tablet Take 20 mEq by mouth daily.      . prochlorperazine (COMPAZINE) 10 MG tablet Take 1 tablet (10 mg total) by mouth every 6 (six) hours as needed (Nausea or vomiting).  30 tablet  1  . vitamin B-12 (CYANOCOBALAMIN) 1000 MCG tablet Take 1,000 mcg by mouth daily.       No current facility-administered medications for this visit.    PHYSICAL EXAMINATION:   Filed Vitals:   11/30/13 1049  BP: 150/65  Pulse: 71  Temp: 98.4 F (36.9 C)  Resp: 20   Filed Weights   11/30/13 1049  Weight: 199 lb 1.6 oz (90.311 kg)   GENERAL: Patient is a well appearing female in no acute distress HEENT:  Sclerae anicteric.  Oropharynx clear and moist. No ulcerations or evidence of oropharyngeal candidiasis. Neck is supple.  NODES:  No cervical, supraclavicular, or axillary lymphadenopathy palpated.  BREAST EXAM:   Soft mass palpable in right upper outer quadrant LUNGS:  Clear to auscultation bilaterally.  No wheezes or rhonchi. HEART:  Regular rate and rhythm. No murmur appreciated. ABDOMEN:  Soft, nontender.  Positive, normoactive bowel sounds. No organomegaly palpated. MSK:  No focal spinal tenderness to palpation. Full range of motion bilaterally in the upper extremities. EXTREMITIES:  Longstanding bilateral lower extremity edema.   SKIN:  Clear with no obvious rashes or skin changes. No nail dyscrasia. NEURO:  Nonfocal. Well oriented.  Appropriate affect. ECOG PERFORMANCE STATUS: 0 - Asymptomatic  LABORATORY DATA:  I have reviewed the data as listed   Chemistry      Component Value Date/Time   NA 138 11/24/2013 0811   NA 140 11/13/2013 1540   K 4.1 11/24/2013 0811   K 3.8 11/13/2013 1540   CL 100 11/13/2013 1540   CO2 26 11/24/2013 0811   CO2 28 11/13/2013 1540   BUN 36.2* 11/24/2013 0811   BUN 27* 11/13/2013 1540   CREATININE 0.9 11/24/2013 0811   CREATININE 1.34* 11/13/2013 1540   CREATININE 0.88 07/27/2013 0913      Component Value Date/Time   CALCIUM 9.5 11/24/2013 0811   CALCIUM 10.2 11/13/2013 1540   ALKPHOS 63 11/24/2013 0811   ALKPHOS 61 11/13/2013 1540   AST 8 11/24/2013 0811   AST 14 11/13/2013 1540   ALT 11 11/24/2013 0811   ALT 9 11/13/2013 1540   BILITOT 0.48 11/24/2013 0811   BILITOT 0.3 11/13/2013 1540       Lab Results  Component Value Date   WBC 12.7* 11/30/2013   HGB 11.3* 11/30/2013   HCT 34.1* 11/30/2013   MCV 86.1 11/30/2013   PLT 117* 11/30/2013   NEUTROABS 10.9* 11/30/2013     RADIOGRAPHIC STUDIES: CLINICAL DATA: Recently diagnosed right breast invasive mammary  carcinoma. Preop evaluation.  LABS: BUN and creatinine were obtained on site at [facility] at  [address]  Results: BUN 20 mg/dL, Creatinine 0.8 mg/dL.  EXAM:  BILATERAL BREAST MRI WITH AND WITHOUT CONTRAST  TECHNIQUE:  Multiplanar, multisequence MR images of both breasts were obtained   prior to and following the intravenous administration of 5m of  MultiHance  THREE-DIMENSIONAL MR IMAGE RENDERING ON INDEPENDENT WORKSTATION:  Three-dimensional MR images were rendered by post-processing of the  original MR data on an independent workstation. The  three-dimensional MR images were interpreted, and findings are  reported in the following complete MRI report for this study. Three  dimensional images were evaluated at the independent DynaCad  workstation  COMPARISON: Previous mammograms dated 10/20/2013, 10/06/2013.  Previous ultrasound images dated 10/20/2013.  FINDINGS:  Breast composition: b. Scattered fibroglandular tissue.  Background parenchymal enhancement: Mild  Right breast: There is an irregular enhancing mass located within  the upper outer quadrant of the right breast (posterior 1/3) which  corresponds to recently diagnosed right breast invasive mammary  carcinoma. Clip artifact is seen along the lateral posterior border  of this mass. The mass measures 3.1 x 1.9 x 2.2 cm in size and is  associated with a mixture of plateau and wash -in /washout  enhancement kinetics. There are no additional worrisome enhancing  foci within the right breast.  Left breast: No mass or abnormal enhancement.  Lymph nodes: There is level 1 right axillary lymph node present  measuring 1.1 x 0.9 x 0.6 cm in size and associated with mild  decrease in central hilar fat. Most likely this corresponds to the  recently biopsied right axillary lymph node (under ultrasound  guidance). There are no additional abnormal appearing lymph nodes.  Ancillary findings: None.  IMPRESSION:  3.1 cm irregular enhancing mass located within the upper outer  quadrant of the right breast correspond to the recently diagnosed  right breast invasive mammary carcinoma. There is a slightly  abnormal appearing level 1 right axillary lymph node present which  most likely corresponds to the lymph node recently  biopsied under  ultrasound guidance. No additional findings.  RECOMMENDATION:  Treatment plan.  BI-RADS CATEGORY 6: Known biopsy-proven malignancy.  Electronically Signed  By: Luberta Robertson M.D.  On: 10/31/2013 12:04   ASSESSMENT:    80 year old woman with   1. Clinical T2N1, stage IIB, invasive ductal carcinoma, grade 1, ER 100%, PR 8%, Ki-67 11%, HER-2/neu negative.    2. Patient was started on neoadjuvant treatment of Doxorubicin and cylcophosphamide.  She receives this day 1 of a 14 day cycle with Neulasta given on day 2 for granulocyte support.  After this we have planned on her receiving Paclitaxel, weekly x 12 weeks.  PLAN:   Whitney [name] is doing well today.  Her labs remain stable and I reviewed them with her in detail.  She will proceed with cycle 2 day 1 of  Doxorubicin and Cyclophosphamide.  We reviewed this chemotherapy regimen, along with her next chemotherapy regimen, and I did give them information about the dates, and scheduling of it.    Whitney [name] will return tomorrow for Neulasta and in one week for labs and evaluation of chemotoxicities.   I spent 25 minutes counseling the patient face to face. The total time spent in the appointment was 30 minutes and more than 50% was on counseling and review of test results  This patient was discussed with Dr. Lindi Adie.    Minette Headland, [place] [phone] 11/30/2013 11:17 AM

## 2013-11-30 NOTE — Patient Instructions (Signed)
[facility] Discharge Instructions for Patients Receiving Chemotherapy  Today you received the following chemotherapy agents Adriamycin and Cytoxan.  To help prevent nausea and vomiting after your treatment, we encourage you to take your nausea medication as prescribed.   If you develop nausea and vomiting that is not controlled by your nausea medication, call the clinic.   BELOW ARE SYMPTOMS THAT SHOULD BE REPORTED IMMEDIATELY:  *FEVER GREATER THAN 100.5 F  *CHILLS WITH OR WITHOUT FEVER  NAUSEA AND VOMITING THAT IS NOT CONTROLLED WITH YOUR NAUSEA MEDICATION  *UNUSUAL SHORTNESS OF BREATH  *UNUSUAL BRUISING OR BLEEDING  TENDERNESS IN MOUTH AND THROAT WITH OR WITHOUT PRESENCE OF ULCERS  *URINARY PROBLEMS  *BOWEL PROBLEMS  UNUSUAL RASH Items with * indicate a potential emergency and should be followed up as soon as possible.  Feel free to call the clinic you have any questions or concerns. The clinic phone number is [phone].    

## 2013-12-01 ENCOUNTER — Ambulatory Visit (HOSPITAL_BASED_OUTPATIENT_CLINIC_OR_DEPARTMENT_OTHER): Payer: Medicare Other

## 2013-12-01 ENCOUNTER — Other Ambulatory Visit: Payer: Self-pay | Admitting: Adult Health

## 2013-12-01 VITALS — BP 132/56 | HR 65 | Temp 98.2°F

## 2013-12-01 DIAGNOSIS — C50419 Malignant neoplasm of upper-outer quadrant of unspecified female breast: Secondary | ICD-10-CM

## 2013-12-01 DIAGNOSIS — C50411 Malignant neoplasm of upper-outer quadrant of right female breast: Secondary | ICD-10-CM

## 2013-12-01 DIAGNOSIS — Z5189 Encounter for other specified aftercare: Secondary | ICD-10-CM

## 2013-12-01 MED ORDER — PEGFILGRASTIM INJECTION 6 MG/0.6ML
6.0000 mg | Freq: Once | SUBCUTANEOUS | Status: AC
  Administered 2013-12-01: 6 mg via SUBCUTANEOUS
  Filled 2013-12-01: qty 0.6

## 2013-12-05 ENCOUNTER — Ambulatory Visit (HOSPITAL_BASED_OUTPATIENT_CLINIC_OR_DEPARTMENT_OTHER): Payer: Medicare Other | Admitting: Adult Health

## 2013-12-05 ENCOUNTER — Ambulatory Visit (HOSPITAL_BASED_OUTPATIENT_CLINIC_OR_DEPARTMENT_OTHER): Payer: Medicare Other

## 2013-12-05 ENCOUNTER — Encounter: Payer: Self-pay | Admitting: Adult Health

## 2013-12-05 VITALS — BP 143/62 | HR 89 | Temp 99.5°F | Resp 18 | Ht 67.0 in | Wt 198.4 lb

## 2013-12-05 DIAGNOSIS — G609 Hereditary and idiopathic neuropathy, unspecified: Secondary | ICD-10-CM

## 2013-12-05 DIAGNOSIS — C50411 Malignant neoplasm of upper-outer quadrant of right female breast: Secondary | ICD-10-CM

## 2013-12-05 DIAGNOSIS — C50419 Malignant neoplasm of upper-outer quadrant of unspecified female breast: Secondary | ICD-10-CM

## 2013-12-05 DIAGNOSIS — T451X5A Adverse effect of antineoplastic and immunosuppressive drugs, initial encounter: Secondary | ICD-10-CM

## 2013-12-05 DIAGNOSIS — D6481 Anemia due to antineoplastic chemotherapy: Secondary | ICD-10-CM

## 2013-12-05 DIAGNOSIS — C773 Secondary and unspecified malignant neoplasm of axilla and upper limb lymph nodes: Secondary | ICD-10-CM

## 2013-12-05 DIAGNOSIS — D6959 Other secondary thrombocytopenia: Secondary | ICD-10-CM

## 2013-12-05 DIAGNOSIS — K5900 Constipation, unspecified: Secondary | ICD-10-CM

## 2013-12-05 LAB — CBC WITH DIFFERENTIAL/PLATELET
BASO%: 0.1 % (ref 0.0–2.0)
Basophils Absolute: 0 10*3/uL (ref 0.0–0.1)
EOS%: 0.6 % (ref 0.0–7.0)
Eosinophils Absolute: 0 10*3/uL (ref 0.0–0.5)
HCT: 32.7 % — ABNORMAL LOW (ref 34.8–46.6)
HGB: 10.3 g/dL — ABNORMAL LOW (ref 11.6–15.9)
LYMPH%: 10.1 % — ABNORMAL LOW (ref 14.0–49.7)
MCH: 27.9 pg (ref 25.1–34.0)
MCHC: 31.7 g/dL (ref 31.5–36.0)
MCV: 88 fL (ref 79.5–101.0)
MONO#: 0 10*3/uL — ABNORMAL LOW (ref 0.1–0.9)
MONO%: 0.6 % (ref 0.0–14.0)
NEUT#: 2.4 10*3/uL (ref 1.5–6.5)
NEUT%: 88.6 % — ABNORMAL HIGH (ref 38.4–76.8)
Platelets: 138 10*3/uL — ABNORMAL LOW (ref 145–400)
RBC: 3.71 10*6/uL (ref 3.70–5.45)
RDW: 16.6 % — ABNORMAL HIGH (ref 11.2–14.5)
WBC: 2.7 10*3/uL — ABNORMAL LOW (ref 3.9–10.3)
lymph#: 0.3 10*3/uL — ABNORMAL LOW (ref 0.9–3.3)

## 2013-12-05 LAB — COMPREHENSIVE METABOLIC PANEL (CC13)
ALT: 6 U/L (ref 0–55)
AST: 9 U/L (ref 5–34)
Albumin: 3 g/dL — ABNORMAL LOW (ref 3.5–5.0)
Alkaline Phosphatase: 79 U/L (ref 40–150)
Anion Gap: 7 mEq/L (ref 3–11)
BUN: 31.6 mg/dL — ABNORMAL HIGH (ref 7.0–26.0)
CO2: 32 mEq/L — ABNORMAL HIGH (ref 22–29)
Calcium: 9.3 mg/dL (ref 8.4–10.4)
Chloride: 102 mEq/L (ref 98–109)
Creatinine: 1 mg/dL (ref 0.6–1.1)
Glucose: 159 mg/dl — ABNORMAL HIGH (ref 70–140)
Potassium: 3.6 mEq/L (ref 3.5–5.1)
Sodium: 140 mEq/L (ref 136–145)
Total Bilirubin: 0.67 mg/dL (ref 0.20–1.20)
Total Protein: 5.7 g/dL — ABNORMAL LOW (ref 6.4–8.3)

## 2013-12-05 NOTE — Progress Notes (Signed)
Patient Care Team: [name], MD as PCP - General (Family Medicine) [name], MD as Consulting Physician (Hematology and Oncology) [name], MD as Consulting Physician (General Surgery) [name], MD as Consulting Physician (Radiation Oncology)  DIAGNOSIS: Breast cancer of upper-outer quadrant of right female breast   Primary site: Breast (Right)   Staging method: AJCC 7th Edition   Clinical: Stage IIB (T2, N1, cM0) signed by [name], MD on 11/01/2013  8:37 AM   Summary: Stage IIB (T2, N1, cM0)   SUMMARY OF ONCOLOGIC HISTORY:   Breast cancer of upper-outer quadrant of right female breast   10/20/2013 Mammogram Ultrasound and mammogram showed 2.1 x 2.1 x 1.9 cm right breast mass   10/20/2013 Initial Diagnosis Breast cancer of upper-outer quadrant of right female breast. Invasive ductal cancer with lymphovascular invasion one lymph node biopsy that was positive for cancer grade 1; Her 2 Neg Ratio 0.96, ER100% PR 8% positive Ki 67: 11%;   10/31/2013 Breast MRI Right breast upper outer quadrant: 3.1 x 1.9 x 2.2 cm: Right axillary lymph node 1.1 x 0.9 x 0.6 cm   11/16/2013 -  Chemotherapy Neoadjuvant dose dense Doxorubicin and Cyclophosphamide given on day 1 of a 14 day cycle with Neulasta given on day 2 for granulocyte support    CHIEF COMPLIANT: Patient is here for chemotherapy cycle 2 day 7 of dose dense Adriamycin and Cytoxan  INTERVAL HISTORY: Whitney [name] is an 80 year old with above-mentioned history of stage II B. clinical stage invasive ductal carcinoma left breast with lymphovascular invasion with one positive lymph node.   Whitney [name] is doing remarkably well today.  She is here to be evaluated prior to cycle 2 day 7 of Doxorubicin and Cyclophosphamide.  She is feeling very well.  She tolerated treatment well.  She does have mild intermittent numbness.  She denies any motor changes.  She does have some constipation and was given a bowel regimen recommendation by  the nurse.  Otherwise, she denies fevers, chills, nausea, vomiting, diarrhea, or any further concerns.    REVIEW OF SYSTEMS:   A 10 point review of systems was conducted and is otherwise negative except for what is noted above.     Past Medical History  Diagnosis Date  . Hypertension   . Hyperlipidemia   . Diabetes mellitus without complication   . Thyroid disease     hypothyroidism  . Anemia   . Former smoker   . Multinodular goiter   . Complication of anesthesia     slow to wake up  . Cancer     right breast   Past Surgical History  Procedure Laterality Date  . Eye surgery  12/22/2009  . Abdominal hysterectomy    . Portacath placement N/A 11/15/2013    Procedure: INSERTION PORT-A-CATH WITH ULTRA SOUND AND FLOROSCOPY;  Surgeon: [name], MD;  Location: [facility];  Service: General;  Laterality: N/A;   History reviewed. No pertinent family history.   ALLERGIES:  has No Known Allergies.  MEDICATIONS:  Current Outpatient Prescriptions  Medication Sig Dispense Refill  . aspirin 81 MG tablet Take 81 mg by mouth daily.      Marland Kitchen atorvastatin (LIPITOR) 10 MG tablet Take 10 mg by mouth daily.      . Cholecalciferol (VITAMIN D) 2000 UNITS CAPS Take 2,000 Units by mouth daily.      . ferrous sulfate 325 (65 FE) MG EC tablet Take 325 mg by mouth daily with breakfast.      .  furosemide (LASIX) 40 MG tablet Take 1-2 tablets daily as needed      . glucose blood test strip 1 each by Other route as needed for other (One Touch Ultra). Use as instructed      . levothyroxine (SYNTHROID, LEVOTHROID) 50 MCG tablet Take 1 tablet (50 mcg total) by mouth daily before breakfast.  90 tablet  0  . lisinopril-hydrochlorothiazide (PRINZIDE,ZESTORETIC) 20-12.5 MG per tablet Take 1 tablet by mouth daily.      Marland Kitchen LORazepam (ATIVAN) 0.5 MG tablet Take 1 tablet (0.5 mg total) by mouth every 6 (six) hours as needed (Nausea or vomiting).  30 tablet  0  . metFORMIN (GLUCOPHAGE-XR) 500 MG 24 hr tablet Take  500-1,000 mg by mouth 2 (two) times daily. Take 500 mg every morning and 1000 mg every evening      . metoprolol succinate (TOPROL-XL) 25 MG 24 hr tablet Take 1 tablet (25 mg total) by mouth daily.  90 tablet  0  . pioglitazone (ACTOS) 30 MG tablet Take 1 tablet (30 mg total) by mouth daily.  90 tablet  0  . Polyethyl Glycol-Propyl Glycol (SYSTANE OP) Place 1 drop into both eyes 3 (three) times daily.      . potassium chloride SA (K-DUR,KLOR-CON) 20 MEQ tablet Take 20 mEq by mouth daily.      . vitamin B-12 (CYANOCOBALAMIN) 1000 MCG tablet Take 1,000 mcg by mouth daily.      Marland Kitchen dexamethasone (DECADRON) 1 MG tablet at bedtime before test  1 tablet  0  . dexamethasone (DECADRON) 4 MG tablet Take 2 tablets by mouth once a day on the day after chemotherapy and then take 2 tablets two times a day for 2 days. Take with food.  30 tablet  1  . HYDROcodone-acetaminophen (NORCO) 5-325 MG per tablet Take 1 tablet by mouth every 6 (six) hours as needed for moderate pain.  30 tablet  0  . lidocaine-prilocaine (EMLA) cream Apply 1 application topically as needed.  30 g  0  . ondansetron (ZOFRAN) 8 MG tablet Take 1 tablet (8 mg total) by mouth 2 (two) times daily as needed. Start on the third day after chemotherapy.  30 tablet  1  . prochlorperazine (COMPAZINE) 10 MG tablet Take 1 tablet (10 mg total) by mouth every 6 (six) hours as needed (Nausea or vomiting).  30 tablet  1   No current facility-administered medications for this visit.    PHYSICAL EXAMINATION:   Filed Vitals:   12/05/13 1439  BP: 143/62  Pulse: 89  Temp: 99.5 F (37.5 C)  Resp: 18   Filed Weights   12/05/13 1439  Weight: 198 lb 6.4 oz (89.994 kg)   GENERAL: Patient is a well appearing female in no acute distress HEENT:  Sclerae anicteric.  Oropharynx clear and moist. No ulcerations or evidence of oropharyngeal candidiasis. Neck is supple.  NODES:  No cervical, supraclavicular, or axillary lymphadenopathy palpated.  BREAST EXAM:   Soft mass palpable in right upper outer quadrant LUNGS:  Clear to auscultation bilaterally.  No wheezes or rhonchi. HEART:  Regular rate and rhythm. No murmur appreciated. ABDOMEN:  Soft, nontender.  Positive, normoactive bowel sounds. No organomegaly palpated. MSK:  No focal spinal tenderness to palpation. Full range of motion bilaterally in the upper extremities. EXTREMITIES:  Longstanding bilateral lower extremity edema.   SKIN:  Clear with no obvious rashes or skin changes. No nail dyscrasia. NEURO:  Nonfocal. Well oriented.  Appropriate affect. ECOG PERFORMANCE STATUS: 0 -  Asymptomatic   LABORATORY DATA:  I have reviewed the data as listed   Chemistry      Component Value Date/Time   NA 140 12/05/2013 1425   NA 141 11/30/2013 1033   K 3.6 12/05/2013 1425   K 3.7 11/30/2013 1033   CL 100 11/30/2013 1033   CO2 32* 12/05/2013 1425   CO2 30 11/30/2013 1033   BUN 31.6* 12/05/2013 1425   BUN 25* 11/30/2013 1033   CREATININE 1.0 12/05/2013 1425   CREATININE 0.89 11/30/2013 1033   CREATININE 0.88 07/27/2013 0913      Component Value Date/Time   CALCIUM 9.3 12/05/2013 1425   CALCIUM 10.0 11/30/2013 1033   ALKPHOS 79 12/05/2013 1425   ALKPHOS 74 11/30/2013 1033   AST 9 12/05/2013 1425   AST 13 11/30/2013 1033   ALT 6 12/05/2013 1425   ALT 11 11/30/2013 1033   BILITOT 0.67 12/05/2013 1425   BILITOT 0.3 11/30/2013 1033       Lab Results  Component Value Date   WBC 2.7* 12/05/2013   HGB 10.3* 12/05/2013   HCT 32.7* 12/05/2013   MCV 88.0 12/05/2013   PLT 138* 12/05/2013   NEUTROABS 2.4 12/05/2013     RADIOGRAPHIC STUDIES: CLINICAL DATA: Recently diagnosed right breast invasive mammary  carcinoma. Preop evaluation.  LABS: BUN and creatinine were obtained on site at [place] at  [address]  Results: BUN 20 mg/dL, Creatinine 0.8 mg/dL.  EXAM:  BILATERAL BREAST MRI WITH AND WITHOUT CONTRAST  TECHNIQUE:  Multiplanar, multisequence MR images of both breasts were obtained   prior to and following the intravenous administration of 54ml of  MultiHance  THREE-DIMENSIONAL MR IMAGE RENDERING ON INDEPENDENT WORKSTATION:  Three-dimensional MR images were rendered by post-processing of the  original MR data on an independent workstation. The  three-dimensional MR images were interpreted, and findings are  reported in the following complete MRI report for this study. Three  dimensional images were evaluated at the independent DynaCad  workstation  COMPARISON: Previous mammograms dated 10/20/2013, 10/06/2013.  Previous ultrasound images dated 10/20/2013.  FINDINGS:  Breast composition: b. Scattered fibroglandular tissue.  Background parenchymal enhancement: Mild  Right breast: There is an irregular enhancing mass located within  the upper outer quadrant of the right breast (posterior 1/3) which  corresponds to recently diagnosed right breast invasive mammary  carcinoma. Clip artifact is seen along the lateral posterior border  of this mass. The mass measures 3.1 x 1.9 x 2.2 cm in size and is  associated with a mixture of plateau and wash -in /washout  enhancement kinetics. There are no additional worrisome enhancing  foci within the right breast.  Left breast: No mass or abnormal enhancement.  Lymph nodes: There is level 1 right axillary lymph node present  measuring 1.1 x 0.9 x 0.6 cm in size and associated with mild  decrease in central hilar fat. Most likely this corresponds to the  recently biopsied right axillary lymph node (under ultrasound  guidance). There are no additional abnormal appearing lymph nodes.  Ancillary findings: None.  IMPRESSION:  3.1 cm irregular enhancing mass located within the upper outer  quadrant of the right breast correspond to the recently diagnosed  right breast invasive mammary carcinoma. There is a slightly  abnormal appearing level 1 right axillary lymph node present which  most likely corresponds to the lymph node recently  biopsied under  ultrasound guidance. No additional findings.  RECOMMENDATION:  Treatment plan.  BI-RADS CATEGORY 6:  Known biopsy-proven malignancy.  Electronically Signed  By: Luberta Robertson M.D.  On: 10/31/2013 12:04   ASSESSMENT:    80 year old woman with   1. Clinical T2N1, stage IIB, invasive ductal carcinoma, grade 1, ER 100%, PR 8%, Ki-67 11%, HER-2/neu negative.    2. Patient was started on neoadjuvant treatment of Doxorubicin and cylcophosphamide.  She receives this day 1 of a 14 day cycle with Neulasta given on day 2 for granulocyte support.  After this we have planned on her receiving Paclitaxel, weekly x 12 weeks.  PLAN:   Whitney [name] is doing well today.  Her labs remain stable and I reviewed them with her in detail. She tolerated treatment well.  She does have a treatment related anemia and mild thrombocytopenia that is stable and we will continue to monitor.    She does have grade 1 neuropathy.  I recommended she take vitamin b 6 for this.   For her constipation she will start with miralax and stool softeners as suggested by Orson Eva, LPN.    Whitney [name] will return in one week for labs, evalaution, and cycle 3 of chemotherapy with Doxorubicin and Cyclophosphamide.    I spent 25 minutes counseling the patient face to face. The total time spent in the appointment was 30 minutes and more than 50% was on counseling and review of test results  Minette Headland, [place] [phone] 12/05/2013 4:06 PM

## 2013-12-06 ENCOUNTER — Other Ambulatory Visit: Payer: Medicare Other

## 2013-12-06 ENCOUNTER — Encounter: Payer: Medicare Other | Admitting: Adult Health

## 2013-12-08 ENCOUNTER — Encounter (HOSPITAL_COMMUNITY): Payer: Self-pay | Admitting: Emergency Medicine

## 2013-12-08 ENCOUNTER — Inpatient Hospital Stay (HOSPITAL_COMMUNITY)
Admission: EM | Admit: 2013-12-08 | Discharge: 2013-12-14 | DRG: 871 | Disposition: A | Discharge status: Home Health | Payer: Medicare Other | Attending: Internal Medicine | Admitting: Internal Medicine

## 2013-12-08 ENCOUNTER — Telehealth: Payer: Self-pay | Admitting: *Deleted

## 2013-12-08 ENCOUNTER — Emergency Department (HOSPITAL_COMMUNITY): Payer: Medicare Other

## 2013-12-08 DIAGNOSIS — I5032 Chronic diastolic (congestive) heart failure: Secondary | ICD-10-CM | POA: Diagnosis present

## 2013-12-08 DIAGNOSIS — C779 Secondary and unspecified malignant neoplasm of lymph node, unspecified: Secondary | ICD-10-CM | POA: Diagnosis present

## 2013-12-08 DIAGNOSIS — Z8249 Family history of ischemic heart disease and other diseases of the circulatory system: Secondary | ICD-10-CM

## 2013-12-08 DIAGNOSIS — C50411 Malignant neoplasm of upper-outer quadrant of right female breast: Secondary | ICD-10-CM

## 2013-12-08 DIAGNOSIS — R5081 Fever presenting with conditions classified elsewhere: Secondary | ICD-10-CM | POA: Diagnosis present

## 2013-12-08 DIAGNOSIS — I4891 Unspecified atrial fibrillation: Secondary | ICD-10-CM | POA: Diagnosis present

## 2013-12-08 DIAGNOSIS — D696 Thrombocytopenia, unspecified: Secondary | ICD-10-CM | POA: Diagnosis present

## 2013-12-08 DIAGNOSIS — E039 Hypothyroidism, unspecified: Secondary | ICD-10-CM | POA: Diagnosis present

## 2013-12-08 DIAGNOSIS — D6959 Other secondary thrombocytopenia: Secondary | ICD-10-CM | POA: Diagnosis present

## 2013-12-08 DIAGNOSIS — E876 Hypokalemia: Secondary | ICD-10-CM | POA: Diagnosis present

## 2013-12-08 DIAGNOSIS — E119 Type 2 diabetes mellitus without complications: Secondary | ICD-10-CM

## 2013-12-08 DIAGNOSIS — E7800 Pure hypercholesterolemia, unspecified: Secondary | ICD-10-CM | POA: Diagnosis present

## 2013-12-08 DIAGNOSIS — D638 Anemia in other chronic diseases classified elsewhere: Secondary | ICD-10-CM | POA: Diagnosis present

## 2013-12-08 DIAGNOSIS — D509 Iron deficiency anemia, unspecified: Secondary | ICD-10-CM | POA: Diagnosis present

## 2013-12-08 DIAGNOSIS — Z171 Estrogen receptor negative status [ER-]: Secondary | ICD-10-CM

## 2013-12-08 DIAGNOSIS — R531 Weakness: Secondary | ICD-10-CM | POA: Diagnosis not present

## 2013-12-08 DIAGNOSIS — D61810 Antineoplastic chemotherapy induced pancytopenia: Secondary | ICD-10-CM | POA: Diagnosis present

## 2013-12-08 DIAGNOSIS — T501X5A Adverse effect of loop [high-ceiling] diuretics, initial encounter: Secondary | ICD-10-CM | POA: Diagnosis present

## 2013-12-08 DIAGNOSIS — Z7982 Long term (current) use of aspirin: Secondary | ICD-10-CM | POA: Diagnosis not present

## 2013-12-08 DIAGNOSIS — I10 Essential (primary) hypertension: Secondary | ICD-10-CM | POA: Diagnosis present

## 2013-12-08 DIAGNOSIS — A4152 Sepsis due to Pseudomonas: Principal | ICD-10-CM | POA: Diagnosis present

## 2013-12-08 DIAGNOSIS — A419 Sepsis, unspecified organism: Secondary | ICD-10-CM

## 2013-12-08 DIAGNOSIS — E785 Hyperlipidemia, unspecified: Secondary | ICD-10-CM | POA: Diagnosis present

## 2013-12-08 DIAGNOSIS — Z87891 Personal history of nicotine dependence: Secondary | ICD-10-CM | POA: Diagnosis not present

## 2013-12-08 DIAGNOSIS — D6481 Anemia due to antineoplastic chemotherapy: Secondary | ICD-10-CM | POA: Diagnosis present

## 2013-12-08 DIAGNOSIS — D709 Neutropenia, unspecified: Secondary | ICD-10-CM | POA: Diagnosis present

## 2013-12-08 DIAGNOSIS — T451X5A Adverse effect of antineoplastic and immunosuppressive drugs, initial encounter: Secondary | ICD-10-CM | POA: Diagnosis present

## 2013-12-08 DIAGNOSIS — Z01810 Encounter for preprocedural cardiovascular examination: Secondary | ICD-10-CM

## 2013-12-08 LAB — COMPREHENSIVE METABOLIC PANEL
ALT: 10 U/L (ref 0–35)
ALT: 9 U/L (ref 0–35)
AST: 9 U/L (ref 0–37)
AST: 7 U/L (ref 0–37)
Albumin: 2.9 g/dL — ABNORMAL LOW (ref 3.5–5.2)
Albumin: 2.5 g/dL — ABNORMAL LOW (ref 3.5–5.2)
Alkaline Phosphatase: 57 U/L (ref 39–117)
Alkaline Phosphatase: 51 U/L (ref 39–117)
Anion gap: 13 (ref 5–15)
Anion gap: 11 (ref 5–15)
BUN: 14 mg/dL (ref 6–23)
BUN: 15 mg/dL (ref 6–23)
CO2: 29 mEq/L (ref 19–32)
CO2: 28 mEq/L (ref 19–32)
Calcium: 9.3 mg/dL (ref 8.4–10.5)
Calcium: 8.8 mg/dL (ref 8.4–10.5)
Chloride: 94 mEq/L — ABNORMAL LOW (ref 96–112)
Chloride: 98 mEq/L (ref 96–112)
Creatinine, Ser: 0.79 mg/dL (ref 0.50–1.10)
Creatinine, Ser: 0.83 mg/dL (ref 0.50–1.10)
GFR calc Af Amer: 87 mL/min — ABNORMAL LOW (ref >90)
GFR calc Af Amer: 74 mL/min — ABNORMAL LOW (ref >90)
GFR calc non Af Amer: 75 mL/min — ABNORMAL LOW (ref >90)
GFR calc non Af Amer: 63 mL/min — ABNORMAL LOW (ref >90)
Glucose, Bld: 176 mg/dL — ABNORMAL HIGH (ref 70–99)
Glucose, Bld: 198 mg/dL — ABNORMAL HIGH (ref 70–99)
Potassium: 3.2 mEq/L — ABNORMAL LOW (ref 3.7–5.3)
Potassium: 3.3 mEq/L — ABNORMAL LOW (ref 3.7–5.3)
Sodium: 136 mEq/L — ABNORMAL LOW (ref 137–147)
Sodium: 137 mEq/L (ref 137–147)
Total Bilirubin: 0.5 mg/dL (ref 0.3–1.2)
Total Bilirubin: 0.4 mg/dL (ref 0.3–1.2)
Total Protein: 6 g/dL (ref 6.0–8.3)
Total Protein: 5.4 g/dL — ABNORMAL LOW (ref 6.0–8.3)

## 2013-12-08 LAB — CBC WITH DIFFERENTIAL/PLATELET
Basophils Absolute: 0 10*3/uL (ref 0.0–0.1)
Basophils Relative: 7 % — ABNORMAL HIGH (ref 0–1)
Eosinophils Absolute: 0 10*3/uL (ref 0.0–0.7)
Eosinophils Relative: 0 % (ref 0–5)
HCT: 27.7 % — ABNORMAL LOW (ref 36.0–46.0)
Hemoglobin: 9.5 g/dL — ABNORMAL LOW (ref 12.0–15.0)
Lymphocytes Relative: 45 % (ref 12–46)
Lymphs Abs: 0.1 10*3/uL — ABNORMAL LOW (ref 0.7–4.0)
MCH: 28.9 pg (ref 26.0–34.0)
MCHC: 34.3 g/dL (ref 30.0–36.0)
MCV: 84.2 fL (ref 78.0–100.0)
Monocytes Absolute: 0 10*3/uL — ABNORMAL LOW (ref 0.1–1.0)
Monocytes Relative: 14 % — ABNORMAL HIGH (ref 3–12)
Neutro Abs: 0.2 10*3/uL — ABNORMAL LOW (ref 1.7–7.7)
Neutrophils Relative %: 34 % — ABNORMAL LOW (ref 43–77)
Platelets: 106 10*3/uL — ABNORMAL LOW (ref 150–400)
RBC: 3.29 MIL/uL — ABNORMAL LOW (ref 3.87–5.11)
RDW: 15.9 % — ABNORMAL HIGH (ref 11.5–15.5)
WBC: 0.3 10*3/uL — CL (ref 4.0–10.5)

## 2013-12-08 LAB — URINALYSIS, ROUTINE W REFLEX MICROSCOPIC
Bilirubin Urine: NEGATIVE
Glucose, UA: NEGATIVE mg/dL
Hgb urine dipstick: NEGATIVE
Ketones, ur: NEGATIVE mg/dL
Nitrite: NEGATIVE
Protein, ur: NEGATIVE mg/dL
Specific Gravity, Urine: 1.012 (ref 1.005–1.030)
Urobilinogen, UA: 0.2 mg/dL (ref 0.0–1.0)
pH: 7 (ref 5.0–8.0)

## 2013-12-08 LAB — MAGNESIUM
Magnesium: 1.4 mg/dL — ABNORMAL LOW (ref 1.5–2.5)
Magnesium: 1.4 mg/dL — ABNORMAL LOW (ref 1.5–2.5)

## 2013-12-08 LAB — TROPONIN I: Troponin I: <0.3 ng/mL (ref <0.30)

## 2013-12-08 LAB — PROTIME-INR
INR: 1.08 (ref 0.00–1.49)
Prothrombin Time: 14 seconds (ref 11.6–15.2)

## 2013-12-08 LAB — TSH: TSH: 0.551 u[IU]/mL (ref 0.350–4.500)

## 2013-12-08 LAB — URINE MICROSCOPIC-ADD ON

## 2013-12-08 LAB — PHOSPHORUS: Phosphorus: 2.5 mg/dL (ref 2.3–4.6)

## 2013-12-08 LAB — LACTIC ACID, PLASMA: Lactic Acid, Venous: 1.3 mmol/L (ref 0.5–2.2)

## 2013-12-08 LAB — APTT: aPTT: 30 seconds (ref 24–37)

## 2013-12-08 MED ORDER — VITAMIN D 1000 UNITS PO TABS
2000.0000 [IU] | ORAL_TABLET | Freq: Every day | ORAL | Status: DC
  Administered 2013-12-09 – 2013-12-14 (×6): 2000 [IU] via ORAL
  Filled 2013-12-08 (×9): qty 2

## 2013-12-08 MED ORDER — PIOGLITAZONE HCL 30 MG PO TABS
30.0000 mg | ORAL_TABLET | Freq: Every day | ORAL | Status: DC
  Administered 2013-12-09 – 2013-12-14 (×6): 30 mg via ORAL
  Filled 2013-12-08 (×7): qty 1

## 2013-12-08 MED ORDER — METOPROLOL SUCCINATE ER 25 MG PO TB24
25.0000 mg | ORAL_TABLET | Freq: Every day | ORAL | Status: DC
  Administered 2013-12-09: 25 mg via ORAL
  Filled 2013-12-08: qty 1

## 2013-12-08 MED ORDER — SODIUM CHLORIDE 0.9 % IV BOLUS (SEPSIS)
1000.0000 mL | Freq: Once | INTRAVENOUS | Status: AC
  Administered 2013-12-08: 1000 mL via INTRAVENOUS

## 2013-12-08 MED ORDER — ASPIRIN 81 MG PO CHEW: 81.0000 mg | CHEWABLE_TABLET | Freq: Every day | ORAL | Status: DC

## 2013-12-08 MED ORDER — POTASSIUM CHLORIDE CRYS ER 20 MEQ PO TBCR
40.0000 meq | EXTENDED_RELEASE_TABLET | Freq: Once | ORAL | Status: AC
  Administered 2013-12-08: 40 meq via ORAL
  Filled 2013-12-08: qty 2

## 2013-12-08 MED ORDER — FERROUS SULFATE 325 (65 FE) MG PO TABS
325.0000 mg | ORAL_TABLET | Freq: Every day | ORAL | Status: DC
  Administered 2013-12-09 – 2013-12-14 (×6): 325 mg via ORAL
  Filled 2013-12-08 (×6): qty 1

## 2013-12-08 MED ORDER — ACETAMINOPHEN 650 MG RE SUPP: 650.0000 mg | Freq: Four times a day (QID) | RECTAL | Status: DC | PRN

## 2013-12-08 MED ORDER — LEVOTHYROXINE SODIUM 50 MCG PO TABS
50.0000 ug | ORAL_TABLET | Freq: Every day | ORAL | Status: DC
  Administered 2013-12-09 – 2013-12-14 (×6): 50 ug via ORAL
  Filled 2013-12-08 (×6): qty 1

## 2013-12-08 MED ORDER — DILTIAZEM HCL 25 MG/5ML IV SOLN
10.0000 mg | Freq: Once | INTRAVENOUS | Status: AC
  Administered 2013-12-08: 10 mg via INTRAVENOUS
  Filled 2013-12-08: qty 5

## 2013-12-08 MED ORDER — ACETAMINOPHEN 325 MG PO TABS
650.0000 mg | ORAL_TABLET | Freq: Once | ORAL | Status: AC
  Administered 2013-12-08: 650 mg via ORAL
  Filled 2013-12-08: qty 2

## 2013-12-08 MED ORDER — HYDROCHLOROTHIAZIDE 12.5 MG PO CAPS: 12.5000 mg | ORAL_CAPSULE | Freq: Every day | ORAL | Status: DC

## 2013-12-08 MED ORDER — ATORVASTATIN CALCIUM 10 MG PO TABS
10.0000 mg | ORAL_TABLET | Freq: Every day | ORAL | Status: DC
  Administered 2013-12-09 – 2013-12-14 (×6): 10 mg via ORAL
  Filled 2013-12-08 (×6): qty 1

## 2013-12-08 MED ORDER — LORAZEPAM 0.5 MG PO TABS: 0.5000 mg | ORAL_TABLET | Freq: Four times a day (QID) | ORAL | Status: DC | PRN

## 2013-12-08 MED ORDER — FUROSEMIDE 40 MG PO TABS: 40.0000 mg | ORAL_TABLET | Freq: Every day | ORAL | Status: DC

## 2013-12-08 MED ORDER — PIPERACILLIN-TAZOBACTAM 3.375 G IVPB 30 MIN
3.3750 g | Freq: Once | INTRAVENOUS | Status: AC
  Administered 2013-12-08: 3.375 g via INTRAVENOUS
  Filled 2013-12-08: qty 50

## 2013-12-08 MED ORDER — ONDANSETRON HCL 4 MG/2ML IJ SOLN: 4.0000 mg | Freq: Four times a day (QID) | INTRAMUSCULAR | Status: DC | PRN

## 2013-12-08 MED ORDER — PROCHLORPERAZINE MALEATE 10 MG PO TABS
10.0000 mg | ORAL_TABLET | Freq: Four times a day (QID) | ORAL | Status: DC | PRN
  Filled 2013-12-08: qty 1

## 2013-12-08 MED ORDER — VITAMIN B-12 1000 MCG PO TABS
1000.0000 ug | ORAL_TABLET | Freq: Every day | ORAL | Status: DC
  Administered 2013-12-09 – 2013-12-14 (×6): 1000 ug via ORAL
  Filled 2013-12-08 (×6): qty 1

## 2013-12-08 MED ORDER — ONDANSETRON HCL 4 MG PO TABS: 4.0000 mg | ORAL_TABLET | Freq: Four times a day (QID) | ORAL | Status: DC | PRN

## 2013-12-08 MED ORDER — DILTIAZEM HCL 25 MG/5ML IV SOLN
5.0000 mg | Freq: Four times a day (QID) | INTRAVENOUS | Status: DC | PRN
  Administered 2013-12-08: 5 mg via INTRAVENOUS
  Filled 2013-12-08: qty 5

## 2013-12-08 MED ORDER — SODIUM CHLORIDE 0.9 % IV SOLN
INTRAVENOUS | Status: DC
  Administered 2013-12-08: 19:00:00 via INTRAVENOUS

## 2013-12-08 MED ORDER — METFORMIN HCL 500 MG PO TABS
500.0000 mg | ORAL_TABLET | Freq: Every day | ORAL | Status: DC
  Administered 2013-12-09 – 2013-12-14 (×6): 500 mg via ORAL
  Filled 2013-12-08 (×6): qty 1

## 2013-12-08 MED ORDER — PIPERACILLIN-TAZOBACTAM 3.375 G IVPB
3.3750 g | Freq: Three times a day (TID) | INTRAVENOUS | Status: DC
  Administered 2013-12-09 – 2013-12-13 (×13): 3.375 g via INTRAVENOUS
  Filled 2013-12-08 (×14): qty 50

## 2013-12-08 MED ORDER — DILTIAZEM HCL 100 MG IV SOLR
5.0000 mg/h | Freq: Once | INTRAVENOUS | Status: AC
  Administered 2013-12-08: 5 mg/h via INTRAVENOUS
  Filled 2013-12-08: qty 100

## 2013-12-08 MED ORDER — ACETAMINOPHEN 325 MG PO TABS: 650.0000 mg | ORAL_TABLET | Freq: Four times a day (QID) | ORAL | Status: DC | PRN

## 2013-12-08 MED ORDER — METFORMIN HCL ER 500 MG PO TB24: 500.0000 mg | ORAL_TABLET | Freq: Two times a day (BID) | ORAL | Status: DC

## 2013-12-08 MED ORDER — POTASSIUM CHLORIDE CRYS ER 20 MEQ PO TBCR
20.0000 meq | EXTENDED_RELEASE_TABLET | Freq: Every day | ORAL | Status: DC
  Administered 2013-12-09 – 2013-12-14 (×6): 20 meq via ORAL
  Filled 2013-12-08 (×6): qty 1

## 2013-12-08 MED ORDER — LISINOPRIL-HYDROCHLOROTHIAZIDE 20-12.5 MG PO TABS: 1.0000 | ORAL_TABLET | Freq: Every day | ORAL | Status: DC

## 2013-12-08 MED ORDER — VANCOMYCIN HCL IN DEXTROSE 750-5 MG/150ML-% IV SOLN
750.0000 mg | Freq: Two times a day (BID) | INTRAVENOUS | Status: DC
  Administered 2013-12-09 (×2): 750 mg via INTRAVENOUS
  Filled 2013-12-08 (×4): qty 150

## 2013-12-08 MED ORDER — SODIUM CHLORIDE 0.9 % IV SOLN
INTRAVENOUS | Status: AC
  Administered 2013-12-08: 23:00:00 via INTRAVENOUS

## 2013-12-08 MED ORDER — METFORMIN HCL ER 500 MG PO TB24
1000.0000 mg | ORAL_TABLET | Freq: Every day | ORAL | Status: DC
  Administered 2013-12-09 – 2013-12-13 (×5): 1000 mg via ORAL
  Filled 2013-12-08 (×7): qty 2

## 2013-12-08 MED ORDER — LISINOPRIL 10 MG PO TABS: 20.0000 mg | ORAL_TABLET | Freq: Every day | ORAL | Status: DC

## 2013-12-08 MED ORDER — SODIUM CHLORIDE 0.9 % IJ SOLN
3.0000 mL | Freq: Two times a day (BID) | INTRAMUSCULAR | Status: DC
  Administered 2013-12-08 – 2013-12-14 (×7): 3 mL via INTRAVENOUS

## 2013-12-08 MED ORDER — VANCOMYCIN HCL IN DEXTROSE 1-5 GM/200ML-% IV SOLN
1000.0000 mg | Freq: Once | INTRAVENOUS | Status: AC
  Administered 2013-12-08: 1000 mg via INTRAVENOUS
  Filled 2013-12-08: qty 200

## 2013-12-08 NOTE — ED Notes (Signed)
Critical called: WBC 0.3 Called by [facility]. Reported to Mila Merry, MD

## 2013-12-08 NOTE — ED Notes (Signed)
Pt c/o weakness and fever starting this morning.  Denies pain.  Pt sts "I just don't feel good."  Pt reports taking extra strength Tylenol between 1400 and 1500.  Hx of breast CA.  Last treatment, chemo on 9/17.

## 2013-12-08 NOTE — H&P (Signed)
[facility] History and Physical  Whitney [name] TJQ:[id number] DOB: Sep 13, 1927 DOA: 12/08/2013  Referring physician: ER physician PCP: Odette Fraction, MD   Chief Complaint: weakness   HPI:  80 year old female with past medical history of stage 2 breast cancer of the right upper quadrant of the breast (initially diagnosed in 10/2013, invasive ductal carcinoma), started chemotherapy with cyclophosphamide and doxorubicin on 11/16/2013 and second dose given 11/30/2013 with neulasta support who presented to [facility] ED 12/08/2013 not feeling well. Patient is a 80 she felt weak and fatigued for past few days. She had a fever at home and to one dose of Tylenol but did not feel significant improvement. She reported some nausea but no vomiting. No diarrhea. No chest pain, shortness of breath or palpitations. No complaints of abdominal pain. No lightheadedness or loss of consciousness. No cough. No reports of blood in the stool or urine. In ED, blood pressure is 90/51 and subsequently 170/152, HR 45-133, RR 10-29, T max 100.4 F and oxygen saturation 93% on 2 L nasal cannula oxygen support. Blood work revealed white blood cell count of 0.3, hemoglobin 9.5 and platelets 106. Potassium was 3.2 and supplemented in ED. The initial troponin level was within normal limits. 12-lead EKG showed atrial fibrillation. Patient received 5 mg IV Cardizem and her heart rate was subsequently 101. She could not be started on Cardizem drip due to soft BP. Chest x-ray showed no acute abnormalities. Urinalysis showed trace leukocytes. Patient was started on broad-spectrum antibiotics for treatment of neutropenic fever and sepsis and she was admitted to step down unit for further management.  Assessment & Plan    Principal Problem:   Neutropenic fever  Likely secondary to recent chemotherapy. Pt has received neulasta support 12/01/2013. Please call oncology in am to inform them of patient's admission (he oncologist Dr. Nicholas Lose in cancer center). WBC count 0.3 on admission. T amx on admission 100.4 F.  Start broad spectrum antibiotics and narrow down as blood cultures and urine culture results become available.  May need Neupogen but will hold off tonight until pt seen by oncologist.  Continue supportive care with IV fluids, antiemetics as needed. Active Problems:   Sepsis  Sepsis criteria met with initial vital signs of tachycardia, tachypnea, hypotension, fever. WBC count on admission 0.3. Possible source of infection UTI (UA on admission showed trace leukocytes). CXR did not show acute cardiopulmonary findings.    New onset atrial fibrillation  The 12 lead EKG showed atrial fibrillation. HR 45-133 on this admission. After 1 dose of Cardizem 5 mg IV HR 114. Order placed for PRN Cardizem and not the drip due to soft BP.  Monitor in SDU.  No complaints of chest pain. Pt just had 2 [facility] 11/08/2013 with essentially unremarkable EF.   Type II or unspecified type diabetes mellitus without mention of complication, not stated as uncontrolled  Check A1c  Resume metformin and actos   Essential hypertension, benign  BP on soft side. Home meds resumed for tomorrow but may need to be help if BP stays soft. Home meds are metoprolol 25 mg daily, lasix 40 mg daily, prinizide 20-12.5 mg daily   Pure hypercholesterolemia  Resume statin therapy    Breast cancer of upper-outer quadrant of right female breast (diagnosed in 10/2013)  Status post chemo with cyclophosphamide and doxorubicin on 11/30/2013 and neulasta support 12/01/2013   Please inform Dr. Madolyn Frieze of patient's admission in am   Anemia of chronic disease adn iron deficiency  anemia / Thrombocytopenia / Pancytopenia due to antineoplastic chemotherapy  Secondary to sequela of chemotherapy given on 11/30/2013.  Hemoglobin is 9.5 and platelet count 106. No current indications for transfusion.  Monitor CBC daily   Resume ferrous sulfate per home regimen     Hypokalemia  repleted in ED. Likely due to lasix.  Resume potassium supplementation.    Hypothyroidism  Check TSH. Resume synthroid.   DVT prophylaxis:   SCD's inalterably due to slight thrombocytopenia   Radiological Exams on Admission:  Dg Chest 2 View 12/08/2013   Enlargement of cardiac silhouette.  No acute abnormalities.      EKG: atrial fibrillation  Code Status: Full Family Communication: Plan of care discussed with the patient  Disposition Plan: Admit for further evaluation to SDU.  Leisa Lenz, MD  [facility] Pager [phone]  Review of Systems:  Constitutional: positive for fever, chills and malaise/fatigue. Negative for diaphoresis.  HENT: Negative for hearing loss, ear pain, nosebleeds, congestion, sore throat, neck pain, tinnitus and ear discharge.   Eyes: Negative for blurred vision, double vision, photophobia, pain, discharge and redness.  Respiratory: Negative for cough, hemoptysis, sputum production, shortness of breath, wheezing and stridor.   Cardiovascular: Negative for chest pain, palpitations, orthopnea, claudication and leg swelling.  Gastrointestinal: Negative for nausea, vomiting and abdominal pain. Negative for heartburn, constipation, blood in stool and melena.  Genitourinary: Negative for dysuria, urgency, frequency, hematuria and flank pain.  Musculoskeletal: Negative for myalgias, back pain, joint pain and falls.  Skin: Negative for itching and rash.  Neurological: Negative for dizziness and positive for weakness. Negative for tingling, tremors, sensory change, speech change, focal weakness, loss of consciousness and headaches.  Endo/Heme/Allergies: Negative for environmental allergies and polydipsia. Does not bruise/bleed easily.  Psychiatric/Behavioral: Negative for suicidal ideas. The patient is not nervous/anxious.      Past Medical History  Diagnosis Date  . Hypertension   . Hyperlipidemia   . Diabetes mellitus without  complication   . Thyroid disease     hypothyroidism  . Anemia   . Former smoker   . Multinodular goiter   . Complication of anesthesia     slow to wake up  . Cancer     right breast   Past Surgical History  Procedure Laterality Date  . Eye surgery  12/22/2009  . Abdominal hysterectomy    . Portacath placement N/A 11/15/2013    Procedure: INSERTION PORT-A-CATH WITH ULTRA SOUND AND FLOROSCOPY;  Surgeon: [name], MD;  Location: [facility];  Service: General;  Laterality: N/A;   Social History:  reports that she quit smoking about 55 years ago. She has never used smokeless tobacco. She reports that she does not drink alcohol or use illicit drugs.  No Known Allergies  Family History: htn in family    Prior to Admission medications   Medication Sig Start Date End Date Taking? Authorizing Provider  aspirin 81 MG tablet Take 81 mg by mouth daily.   Yes Historical Provider, MD  atorvastatin (LIPITOR) 10 MG tablet Take 10 mg by mouth daily.   Yes Historical Provider, MD  Cholecalciferol (VITAMIN D) 2000 UNITS CAPS Take 2,000 Units by mouth daily.   Yes Historical Provider, MD  dexamethasone (DECADRON) 1 MG tablet Take 1 mg by mouth at bedtime as needed (take the night before infusion).   Yes Historical Provider, MD  dexamethasone (DECADRON) 4 MG tablet Take 2 tablets by mouth once a day on the day after chemotherapy and then take 2 tablets two  times a day for 2 days. Take with food. 11/14/13  Yes [name], MD  ferrous sulfate 325 (65 FE) MG EC tablet Take 325 mg by mouth daily with breakfast.   Yes Historical Provider, MD  furosemide (LASIX) 40 MG tablet Take 40 mg by mouth daily.  09/20/13  Yes Elayne Snare, MD  levothyroxine (SYNTHROID, LEVOTHROID) 50 MCG tablet Take 1 tablet (50 mcg total) by mouth daily before breakfast. 09/21/13  Yes Elayne Snare, MD  lidocaine-prilocaine (EMLA) cream Apply 1 application topically as needed. 11/16/13  Yes [name], MD  lisinopril-hydrochlorothiazide  (PRINZIDE,ZESTORETIC) 20-12.5 MG per tablet Take 1 tablet by mouth daily.   Yes Historical Provider, MD  LORazepam (ATIVAN) 0.5 MG tablet Take 1 tablet (0.5 mg total) by mouth every 6 (six) hours as needed (Nausea or vomiting). 11/14/13  Yes [name], MD  metFORMIN (GLUCOPHAGE-XR) 500 MG 24 hr tablet Take 500-1,000 mg by mouth 2 (two) times daily. Take 500 mg every morning and 1000 mg every evening   Yes Historical Provider, MD  metoprolol succinate (TOPROL-XL) 25 MG 24 hr tablet Take 1 tablet (25 mg total) by mouth daily. 09/21/13  Yes Elayne Snare, MD  ondansetron (ZOFRAN) 8 MG tablet Take 1 tablet (8 mg total) by mouth 2 (two) times daily as needed. Start on the third day after chemotherapy. 11/14/13  Yes [name], MD  pioglitazone (ACTOS) 30 MG tablet Take 1 tablet (30 mg total) by mouth daily. 09/21/13  Yes Elayne Snare, MD  Polyethyl Glycol-Propyl Glycol (SYSTANE OP) Place 1 drop into both eyes 3 (three) times daily.   Yes Historical Provider, MD  potassium chloride SA (K-DUR,KLOR-CON) 20 MEQ tablet Take 20 mEq by mouth daily.   Yes Historical Provider, MD  prochlorperazine (COMPAZINE) 10 MG tablet Take 1 tablet (10 mg total) by mouth every 6 (six) hours as needed (Nausea or vomiting). 11/14/13  Yes [name], MD  vitamin B-12 (CYANOCOBALAMIN) 1000 MCG tablet Take 1,000 mcg by mouth daily.   Yes Historical Provider, MD   Physical Exam: Filed Vitals:   12/08/13 1920 12/08/13 1930 12/08/13 1945 12/08/13 2000  BP: 106/58 117/58  102/60  Pulse: 45 120 94 114  Temp:      TempSrc:      Resp: 20 20 26 20   SpO2: 94% 96% 95% 96%    Physical Exam  Constitutional: Appears well-developed and well-nourished. No distress.  HENT: Normocephalic. No tonsillar erythema or exudates Eyes: Conjunctivae and EOM are normal. PERRLA, no scleral icterus.  Neck: Normal ROM. Neck supple. No JVD. No tracheal deviation. No thyromegaly.  CVS: irregular rate and rhythm, S1/S2 +, no murmurs, no gallops, no  carotid bruit.  Pulmonary: Effort and breath sounds normal, no stridor, rhonchi, wheezes, rales.  Abdominal: Soft. BS +,  no distension, tenderness, rebound or guarding.  Musculoskeletal: Normal range of motion. No edema and no tenderness.  Lymphadenopathy: No lymphadenopathy noted, cervical, inguinal. Neuro: Alert. Normal reflexes, muscle tone coordination. No focal neurologic deficits. Skin: Skin is warm and dry. No rash noted. Not diaphoretic. No erythema. No pallor.  Psychiatric: Normal mood and affect. Behavior, judgment, thought content normal.   Labs on Admission:  Basic Metabolic Panel:  Recent Labs Lab 12/05/13 1425 12/08/13 1729  NA 140 136*  K 3.6 3.2*  CL  --  94*  CO2 32* 29  GLUCOSE 159* 176*  BUN 31.6* 14  CREATININE 1.0 0.79  CALCIUM 9.3 9.3   Liver Function Tests:  Recent Labs Lab 12/05/13 1425 12/08/13 1729  AST 9 9  ALT 6 10  ALKPHOS 79 57  BILITOT 0.67 0.5  PROT 5.7* 6.0  ALBUMIN 3.0* 2.9*   No results found for this basename: LIPASE, AMYLASE,  in the last 168 hours No results found for this basename: AMMONIA,  in the last 168 hours CBC:  Recent Labs Lab 12/05/13 1425 12/08/13 1729  WBC 2.7* 0.3*  NEUTROABS 2.4 0.2*  HGB 10.3* 9.5*  HCT 32.7* 27.7*  MCV 88.0 84.2  PLT 138* 106*   Cardiac Enzymes:  Recent Labs Lab 12/08/13 1849  TROPONINI <0.30   BNP: No components found with this basename: POCBNP,  CBG: No results found for this basename: GLUCAP,  in the last 168 hours  If 7PM-7AM, please contact night-coverage [email or web address] Password [facility] 12/08/2013, 8:38 PM

## 2013-12-08 NOTE — ED Provider Notes (Signed)
CSN: [id number]     Arrival date & time 12/08/13  1625 History   First MD Initiated Contact with Patient 12/08/13 1736     Chief Complaint  Patient presents with  . Weakness  . Fever     (Consider location/radiation/quality/duration/timing/severity/associated sxs/prior Treatment) HPI  80 year old female with past medical history of stage 2 breast cancer of the right upper quadrant of the breast (initially diagnosed in 10/2013, invasive ductal carcinoma), started chemotherapy with cyclophosphamide and doxorubicin on 11/16/2013 and second dose given 11/30/2013 with neulasta. She has felt weak and fatigued for past few days. She had a fever at home and to one dose of Tylenol but did not feel significant improvement. She reported some nausea but no vomiting. No diarrhea. No chest pain, shortness of breath or palpitations. No complaints of abdominal pain. No lightheadedness or loss of consciousness. No cough. No reports of blood in the stool or urine.    Past Medical History  Diagnosis Date  . Hypertension   . Hyperlipidemia   . Diabetes mellitus without complication   . Thyroid disease     hypothyroidism  . Anemia   . Former smoker   . Multinodular goiter   . Complication of anesthesia     slow to wake up  . Cancer     right breast   Past Surgical History  Procedure Laterality Date  . Eye surgery  12/22/2009  . Abdominal hysterectomy    . Portacath placement N/A 11/15/2013    Procedure: INSERTION PORT-A-CATH WITH ULTRA SOUND AND FLOROSCOPY;  Surgeon: [name], MD;  Location: [facility];  Service: General;  Laterality: N/A;   History reviewed. No pertinent family history. History  Substance Use Topics  . Smoking status: Former Smoker -- 1.00 packs/day for 5 years    Quit date: 11/14/1958  . Smokeless tobacco: Never Used  . Alcohol Use: No   OB History   Grav Para Term Preterm Abortions TAB SAB Ect Mult Living                 Review of Systems  All systems reviewed and  negative, other than as noted in HPI.   Allergies  Review of patient's allergies indicates no known allergies.  Home Medications   Prior to Admission medications   Medication Sig Start Date End Date Taking? Authorizing Provider  aspirin 81 MG tablet Take 81 mg by mouth daily.   Yes Historical Provider, MD  atorvastatin (LIPITOR) 10 MG tablet Take 10 mg by mouth daily.   Yes Historical Provider, MD  Cholecalciferol (VITAMIN D) 2000 UNITS CAPS Take 2,000 Units by mouth daily.   Yes Historical Provider, MD  dexamethasone (DECADRON) 1 MG tablet Take 1 mg by mouth at bedtime as needed (take the night before infusion).   Yes Historical Provider, MD  dexamethasone (DECADRON) 4 MG tablet Take 2 tablets by mouth once a day on the day after chemotherapy and then take 2 tablets two times a day for 2 days. Take with food. 11/14/13  Yes [name], MD  ferrous sulfate 325 (65 FE) MG EC tablet Take 325 mg by mouth daily with breakfast.   Yes Historical Provider, MD  furosemide (LASIX) 40 MG tablet Take 40 mg by mouth daily.  09/20/13  Yes Elayne Snare, MD  levothyroxine (SYNTHROID, LEVOTHROID) 50 MCG tablet Take 1 tablet (50 mcg total) by mouth daily before breakfast. 09/21/13  Yes Elayne Snare, MD  lidocaine-prilocaine (EMLA) cream Apply 1 application topically as  needed. 11/16/13  Yes [name], MD  lisinopril-hydrochlorothiazide (PRINZIDE,ZESTORETIC) 20-12.5 MG per tablet Take 1 tablet by mouth daily.   Yes Historical Provider, MD  LORazepam (ATIVAN) 0.5 MG tablet Take 1 tablet (0.5 mg total) by mouth every 6 (six) hours as needed (Nausea or vomiting). 11/14/13  Yes [name], MD  metFORMIN (GLUCOPHAGE-XR) 500 MG 24 hr tablet Take 500-1,000 mg by mouth 2 (two) times daily. Take 500 mg every morning and 1000 mg every evening   Yes Historical Provider, MD  metoprolol succinate (TOPROL-XL) 25 MG 24 hr tablet Take 1 tablet (25 mg total) by mouth daily. 09/21/13  Yes Elayne Snare, MD  ondansetron (ZOFRAN) 8 MG  tablet Take 1 tablet (8 mg total) by mouth 2 (two) times daily as needed. Start on the third day after chemotherapy. 11/14/13  Yes [name], MD  pioglitazone (ACTOS) 30 MG tablet Take 1 tablet (30 mg total) by mouth daily. 09/21/13  Yes Elayne Snare, MD  Polyethyl Glycol-Propyl Glycol (SYSTANE OP) Place 1 drop into both eyes 3 (three) times daily.   Yes Historical Provider, MD  potassium chloride SA (K-DUR,KLOR-CON) 20 MEQ tablet Take 20 mEq by mouth daily.   Yes Historical Provider, MD  prochlorperazine (COMPAZINE) 10 MG tablet Take 1 tablet (10 mg total) by mouth every 6 (six) hours as needed (Nausea or vomiting). 11/14/13  Yes [name], MD  vitamin B-12 (CYANOCOBALAMIN) 1000 MCG tablet Take 1,000 mcg by mouth daily.   Yes Historical Provider, MD   BP 170/152  Pulse 92  Temp(Src) 100.4 F (38 C) (Rectal)  Resp 21  SpO2 96% Physical Exam  Nursing note and vitals reviewed. Constitutional: She appears well-developed and well-nourished. No distress.  HENT:  Head: Normocephalic and atraumatic.  Eyes: Conjunctivae are normal. Right eye exhibits no discharge. Left eye exhibits no discharge.  Neck: Neck supple.  Cardiovascular: Regular rhythm and normal heart sounds.  Exam reveals no gallop and no friction rub.   No murmur heard. Tachycardic. Irregularly irregular. Port right chest wall without external signs of infection.  Pulmonary/Chest: Effort normal and breath sounds normal. No respiratory distress.  Abdominal: Soft. She exhibits no distension. There is no tenderness.  Musculoskeletal: She exhibits no edema and no tenderness.  Neurological: She is alert.  Skin: Skin is warm and dry.  Psychiatric: She has a normal mood and affect. Her behavior is normal. Thought content normal.    ED Course  Procedures (including critical care time) Labs Review Labs Reviewed  COMPREHENSIVE METABOLIC PANEL - Abnormal; Notable for the following:    Sodium 136 (*)    Potassium 3.2 (*)    Chloride  94 (*)    Glucose, Bld 176 (*)    Albumin 2.9 (*)    GFR calc non Af Amer 75 (*)    GFR calc Af Amer 87 (*)    All other components within normal limits  CBC WITH DIFFERENTIAL  URINALYSIS, ROUTINE W REFLEX MICROSCOPIC    Imaging Review No results found.   EKG Interpretation   Date/Time:  Friday December 08 2013 17:12:06 EDT Ventricular Rate:  127 PR Interval:    QRS Duration: 59 QT Interval:  320 QTC Calculation: 465 R Axis:   61 Text Interpretation:  Atrial fibrillation Ventricular premature complex  Abnormal R-wave progression, early transition Repolarization abnormality,  prob rate related ED Confirmed by TEST, Record ([id number]) on 12/10/2013  8:43:01 AM      MDM   Final diagnoses:  Neutropenic  fever  New onset a-fib    80 year old female with neutropenic fever. Empiric antibiotics. Septic workup. Admission. Onset age of fibrillation. Cardizem. Further management per medicine team.   Virgel Manifold, MD 12/14/13 1324

## 2013-12-08 NOTE — Addendum Note (Signed)
Addended by: Cherylynn Ridges on: 12/08/2013 04:08 PM   Modules accepted: Orders

## 2013-12-08 NOTE — ED Notes (Signed)
MD Kohut notified BP 90/51 before starting Cardizem gtt. Advised to start Cardizem gtt. Starting NS bolus ordered at this time.

## 2013-12-08 NOTE — Progress Notes (Signed)
This encounter was created in error - please disregard.

## 2013-12-08 NOTE — Telephone Encounter (Signed)
Unable to obtain lab results requested for today's appointment request.  Verbal order received and read back for patient to go to the ER.  Called [facility] Head[facility] to obtain a mobile number to reach patient.  Daughter Thayer Headings will notify mom to go to the ER.   Called [facility] to notify charge nurse.

## 2013-12-08 NOTE — ED Notes (Signed)
MD ok for patient to have meal.

## 2013-12-08 NOTE — Progress Notes (Addendum)
ANTIBIOTIC CONSULT NOTE - Brief Note  Pharmacy Consult for zosyn (adding to vancomycin) Indication: Febrile neutropenia   Please refer to note from Peggyann Juba, PharmD vancomycin consult for further details.  Zosyn now added per pharmacy  Plan: - start zosyn 3.375gm IV q8h over 4h infusion  Doreene Eland, PharmD, BCPS.   Pager: [phone] 12/08/2013 9:36 PM

## 2013-12-08 NOTE — ED Notes (Signed)
Bed: ML54 Expected date:  Expected time:  Means of arrival:  Comments: Cancer center patient

## 2013-12-08 NOTE — Telephone Encounter (Signed)
   Provider input needed: increased temperature   Reason for call: Reporting temperature changes asking if tylenol can be given  Constitutional: positive for fevers   ALLERGIES:  has No Known Allergies.  Patient last received chemotherapy/ treatment on 11-30-2013 A/C cycle 2  Patient was last seen in the office on 12-05-2013  Next appt is 12-14-2013  Is patient having fevers greater than 100.5?  no, "Temperature about 0900 = 98.1.  Temperature at 10:10 = 100.6.  Temperature checked at 1020 = 100.5"   Is patient having uncontrolled pain, or new pain? no, Denies pain.   Is patient having new back pain that changes with position (worsens or eases when laying down?)  no   Is patient able to eat and drink? yes, denies N/V    Is patient able to pass stool without difficulty?   yes     Is patient having uncontrolled nausea?  no    Daughter Whitney [name] calls 12/08/2013 with complaint of  Constitutional: positive for fevers and reports temperature changes, checked by family members this morning.  "She feels fine she just has a temperature.  Can we give tylenol and how many?  has ES TYlenol on hand;  Advised to give one pill.  Asked when to check the temperature again.     Summary Based on the above information advised Whitney [name] to  Check temperature in two hours to see how she responds and to call if temperature greater than 100.5.  Will notify providers.     [name], [name]  12/08/2013, 10:41 AM   Background Info  Whitney [name]   DOB: 01/22/1928   MR#: [id number]   CSN#   [id number] 12/08/2013

## 2013-12-08 NOTE — Progress Notes (Signed)
ANTIBIOTIC CONSULT NOTE - INITIAL  Pharmacy Consult for Vancomycin Indication: rule out sepsis  No Known Allergies  Patient Measurements:   Height: 67 inches Weight: 90 kg  Vital Signs: Temp: 100.4 F (38 C) (09/25 1729) Temp src: Rectal (09/25 1729) BP: 170/152 mmHg (09/25 1639) Pulse Rate: 92 (09/25 1745) Intake/Output from previous day:   Intake/Output from this shift:    Labs:  Recent Labs  12/08/13 1729  WBC 0.3*  HGB 9.5*  PLT 106*  CREATININE 0.79   The CrCl is unknown because both a height and weight (above a minimum accepted value) are required for this calculation. CrCl ~ 60 ml/min/1.107m2 (normalized)  No results found for this basename: VANCOTROUGH, VANCOPEAK, VANCORANDOM, GENTTROUGH, GENTPEAK, GENTRANDOM, TOBRATROUGH, TOBRAPEAK, TOBRARND, AMIKACINPEAK, AMIKACINTROU, AMIKACIN,  in the last 72 hours   Microbiology: No results found for this or any previous visit (from the past 720 hour(s)).  Medical History: Past Medical History  Diagnosis Date  . Hypertension   . Hyperlipidemia   . Diabetes mellitus without complication   . Thyroid disease     hypothyroidism  . Anemia   . Former smoker   . Multinodular goiter   . Complication of anesthesia     slow to wake up  . Cancer     right breast    Medications:  Aspirin Lipitor  Vitamin D Decadron Ferrous sulfate Lasix Synthroid Lisinopril/HCTZ Metformin XR Metoprolol XL Actos Systane eye drops KDur Vitamin B 12   Assessment: 80 yo female with breast cancer s/p chemo 9/17, presents to ED with weakness and fever. Pharmacy is consulted to dose vancomycin for presumed sepsis.  9/25 >> Vancomycin >> 9/25 >> Zosyn x 1   Tmax: 100.4 WBCs: ANC 0.2 - Neulasta 9/18 Renal: SCr 0.79, CrCl 60 ml/min (N)  9/25 blood x 2: 9/25 urine:   Goal of Therapy:  Vancomycin trough level 15-20 mcg/ml  Plan:   Vancomycin 1g IV x 1 now, then 750mg  IV q12h  Check trough at steady state Follow up  renal function & cultures Continue Zosyn (or Cefepime)?  Peggyann Juba, PharmD, BCPS Pager: [phone] 12/08/2013,6:41 PM

## 2013-12-08 NOTE — Telephone Encounter (Signed)
Call received from [facility] with orders for patient to be seen.  Selena Lesser with [facility] will see patient.  Orders for lab received.  Called patient.  LIves in [place] and cannot get here until 4:30 pm at the earliest.  Mendel Ryder notified and message sent via infusion charge nurse for [employer].

## 2013-12-09 DIAGNOSIS — C50419 Malignant neoplasm of upper-outer quadrant of unspecified female breast: Secondary | ICD-10-CM

## 2013-12-09 DIAGNOSIS — D6959 Other secondary thrombocytopenia: Secondary | ICD-10-CM

## 2013-12-09 DIAGNOSIS — D6481 Anemia due to antineoplastic chemotherapy: Secondary | ICD-10-CM

## 2013-12-09 DIAGNOSIS — T451X5A Adverse effect of antineoplastic and immunosuppressive drugs, initial encounter: Secondary | ICD-10-CM

## 2013-12-09 DIAGNOSIS — E119 Type 2 diabetes mellitus without complications: Secondary | ICD-10-CM

## 2013-12-09 LAB — COMPREHENSIVE METABOLIC PANEL
ALT: 8 U/L (ref 0–35)
AST: 8 U/L (ref 0–37)
Albumin: 2.5 g/dL — ABNORMAL LOW (ref 3.5–5.2)
Alkaline Phosphatase: 52 U/L (ref 39–117)
Anion gap: 11 (ref 5–15)
BUN: 12 mg/dL (ref 6–23)
CO2: 27 mEq/L (ref 19–32)
Calcium: 8.7 mg/dL (ref 8.4–10.5)
Chloride: 99 mEq/L (ref 96–112)
Creatinine, Ser: 0.73 mg/dL (ref 0.50–1.10)
GFR calc Af Amer: 89 mL/min — ABNORMAL LOW (ref >90)
GFR calc non Af Amer: 77 mL/min — ABNORMAL LOW (ref >90)
Glucose, Bld: 146 mg/dL — ABNORMAL HIGH (ref 70–99)
Potassium: 3.4 mEq/L — ABNORMAL LOW (ref 3.7–5.3)
Sodium: 137 mEq/L (ref 137–147)
Total Bilirubin: 0.4 mg/dL (ref 0.3–1.2)
Total Protein: 5.5 g/dL — ABNORMAL LOW (ref 6.0–8.3)

## 2013-12-09 LAB — CBC WITH DIFFERENTIAL/PLATELET
Basophils Absolute: 0 10*3/uL (ref 0.0–0.1)
Basophils Relative: 0 % (ref 0–1)
Eosinophils Absolute: 0 10*3/uL (ref 0.0–0.7)
Eosinophils Relative: 0 % (ref 0–5)
HCT: 26.2 % — ABNORMAL LOW (ref 36.0–46.0)
Hemoglobin: 8.8 g/dL — ABNORMAL LOW (ref 12.0–15.0)
Lymphocytes Relative: 45 % (ref 12–46)
Lymphs Abs: 0.2 10*3/uL — ABNORMAL LOW (ref 0.7–4.0)
MCH: 28.5 pg (ref 26.0–34.0)
MCHC: 33.6 g/dL (ref 30.0–36.0)
MCV: 84.8 fL (ref 78.0–100.0)
Monocytes Absolute: 0.1 10*3/uL (ref 0.1–1.0)
Monocytes Relative: 22 % — ABNORMAL HIGH (ref 3–12)
Neutro Abs: 0.1 10*3/uL — ABNORMAL LOW (ref 1.7–7.7)
Neutrophils Relative %: 33 % — ABNORMAL LOW (ref 43–77)
Platelets: 91 10*3/uL — ABNORMAL LOW (ref 150–400)
RBC: 3.09 MIL/uL — ABNORMAL LOW (ref 3.87–5.11)
RDW: 16 % — ABNORMAL HIGH (ref 11.5–15.5)
WBC: 0.4 10*3/uL — CL (ref 4.0–10.5)

## 2013-12-09 LAB — GLUCOSE, CAPILLARY
Glucose-Capillary: 140 mg/dL — ABNORMAL HIGH (ref 70–99)
Glucose-Capillary: 135 mg/dL — ABNORMAL HIGH (ref 70–99)
Glucose-Capillary: 106 mg/dL — ABNORMAL HIGH (ref 70–99)

## 2013-12-09 LAB — CBC
HCT: 27.7 % — ABNORMAL LOW (ref 36.0–46.0)
Hemoglobin: 9.4 g/dL — ABNORMAL LOW (ref 12.0–15.0)
MCH: 28.8 pg (ref 26.0–34.0)
MCHC: 33.9 g/dL (ref 30.0–36.0)
MCV: 85 fL (ref 78.0–100.0)
Platelets: 104 10*3/uL — ABNORMAL LOW (ref 150–400)
RBC: 3.26 MIL/uL — ABNORMAL LOW (ref 3.87–5.11)
RDW: 16 % — ABNORMAL HIGH (ref 11.5–15.5)
WBC: 0.6 10*3/uL — CL (ref 4.0–10.5)

## 2013-12-09 LAB — MRSA PCR SCREENING: MRSA by PCR: NEGATIVE

## 2013-12-09 LAB — HEMOGLOBIN A1C
Hgb A1c MFr Bld: 7.1 % — ABNORMAL HIGH (ref <5.7)
Mean Plasma Glucose: 157 mg/dL — ABNORMAL HIGH (ref <117)

## 2013-12-09 MED ORDER — METOPROLOL TARTRATE 25 MG PO TABS
50.0000 mg | ORAL_TABLET | Freq: Two times a day (BID) | ORAL | Status: DC
  Administered 2013-12-09: 50 mg via ORAL
  Filled 2013-12-09: qty 2

## 2013-12-09 MED ORDER — POTASSIUM CHLORIDE CRYS ER 20 MEQ PO TBCR
40.0000 meq | EXTENDED_RELEASE_TABLET | Freq: Once | ORAL | Status: AC
  Administered 2013-12-09: 40 meq via ORAL
  Filled 2013-12-09: qty 2

## 2013-12-09 MED ORDER — SODIUM CHLORIDE 0.9 % IV BOLUS (SEPSIS)
500.0000 mL | Freq: Once | INTRAVENOUS | Status: AC
  Administered 2013-12-09: 500 mL via INTRAVENOUS

## 2013-12-09 MED ORDER — MAGNESIUM SULFATE IN D5W 10-5 MG/ML-% IV SOLN
1.0000 g | Freq: Once | INTRAVENOUS | Status: AC
  Administered 2013-12-09: 1 g via INTRAVENOUS
  Filled 2013-12-09: qty 100

## 2013-12-09 MED ORDER — METOPROLOL TARTRATE 25 MG PO TABS
25.0000 mg | ORAL_TABLET | Freq: Once | ORAL | Status: AC
  Administered 2013-12-09: 25 mg via ORAL
  Filled 2013-12-09: qty 1

## 2013-12-09 MED ORDER — DILTIAZEM HCL 100 MG IV SOLR
5.0000 mg/h | Freq: Once | INTRAVENOUS | Status: AC
  Administered 2013-12-09: 5 mg/h via INTRAVENOUS
  Filled 2013-12-09: qty 100

## 2013-12-09 MED ORDER — APIXABAN 5 MG PO TABS
5.0000 mg | ORAL_TABLET | Freq: Two times a day (BID) | ORAL | Status: DC
  Administered 2013-12-09 – 2013-12-14 (×11): 5 mg via ORAL
  Filled 2013-12-09 (×11): qty 1

## 2013-12-09 MED ORDER — INSULIN ASPART 100 UNIT/ML ~~LOC~~ SOLN: 0.0000 [IU] | Freq: Every day | SUBCUTANEOUS | Status: DC

## 2013-12-09 MED ORDER — INSULIN ASPART 100 UNIT/ML ~~LOC~~ SOLN
0.0000 [IU] | Freq: Three times a day (TID) | SUBCUTANEOUS | Status: DC
  Administered 2013-12-09 – 2013-12-10 (×3): 2 [IU] via SUBCUTANEOUS
  Administered 2013-12-11: 3 [IU] via SUBCUTANEOUS
  Administered 2013-12-11: 2 [IU] via SUBCUTANEOUS
  Administered 2013-12-12 (×2): 3 [IU] via SUBCUTANEOUS
  Administered 2013-12-13 – 2013-12-14 (×3): 2 [IU] via SUBCUTANEOUS

## 2013-12-09 MED ORDER — POTASSIUM CHLORIDE 10 MEQ/100ML IV SOLN
10.0000 meq | INTRAVENOUS | Status: AC
  Administered 2013-12-09 (×2): 10 meq via INTRAVENOUS
  Filled 2013-12-09 (×2): qty 100

## 2013-12-09 MED ORDER — DILTIAZEM HCL 100 MG IV SOLR
5.0000 mg/h | INTRAVENOUS | Status: DC
  Administered 2013-12-09: 12.5 mg/h via INTRAVENOUS
  Administered 2013-12-10: 10 mg/h via INTRAVENOUS
  Filled 2013-12-09: qty 100

## 2013-12-09 NOTE — Consult Note (Signed)
[facility] NOTE  Patient Care Team: [name], MD as PCP - General (Family Medicine) [name], MD as Consulting Physician (Hematology and Oncology) [name], MD as Consulting Physician (General Surgery) [name], MD as Consulting Physician (Radiation Oncology)  CHIEF COMPLAINTS/PURPOSE OF CONSULTATION:  Breast cancer on neoadjuvant chemotherapy admitted with neutropenic fever  HISTORY OF PRESENTING ILLNESS:  Whitney [name] 80 y.o. female has a history of stage II B. right-sided breast cancer and she is being treated with neoadjuvant chemotherapy with dose dense Adriamycin and Cytoxan. She received cycle 2 of chemotherapy on 11/28/2013. She tolerated this treatment extremely well. She came for a follow up visit on 12/03/2013 and felt fine without any problems or concerns. She called in on 12/08/2013 complaining of low-grade temperature. Because she was neutropenic on 12/03/2013, patient was asked to come in and be evaluated. In the emergency room she was found to have a tachycardia and hypertension with a temperature of 100.4 for height and a white blood cell count of 0.3. EKG showed atrial fibrillation and she was started on Cardizem drip for rate control. She was also started on broad-spectrum antibiotics with Zosyn and vancomycin. Blood cultures chest x-ray urine cultures were all obtained. This morning she does not have any fevers but she is currently on Cardizem drip for tachycardia. She continues to be in atrial fibrillation.  I reviewed her records extensively and collaborated the history with the patient.  SUMMARY OF ONCOLOGIC HISTORY:   Breast cancer of upper-outer quadrant of right female breast   10/20/2013 Mammogram Ultrasound and mammogram showed 2.1 x 2.1 x 1.9 cm right breast mass   10/20/2013 Initial Diagnosis Breast cancer of upper-outer quadrant of right female breast. Invasive ductal cancer with lymphovascular invasion one lymph  node biopsy that was positive for cancer grade 1; Her 2 Neg Ratio 0.96, ER100% PR 8% positive Ki 67: 11%;   10/31/2013 Breast MRI Right breast upper outer quadrant: 3.1 x 1.9 x 2.2 cm: Right axillary lymph node 1.1 x 0.9 x 0.6 cm   11/16/2013 -  Chemotherapy Neoadjuvant dose dense Doxorubicin and Cyclophosphamide given on day 1 of a 14 day cycle with Neulasta given on day 2 for granulocyte support   MEDICAL HISTORY:  Past Medical History  Diagnosis Date  . Hypertension   . Hyperlipidemia   . Diabetes mellitus without complication   . Thyroid disease     hypothyroidism  . Anemia   . Former smoker   . Multinodular goiter   . Complication of anesthesia     slow to wake up  . Cancer     right breast    SURGICAL HISTORY: Past Surgical History  Procedure Laterality Date  . Eye surgery  12/22/2009  . Abdominal hysterectomy    . Portacath placement N/A 11/15/2013    Procedure: INSERTION PORT-A-CATH WITH ULTRA SOUND AND FLOROSCOPY;  Surgeon: [name], MD;  Location: [facility];  Service: General;  Laterality: N/A;    SOCIAL HISTORY: History   Social History  . Marital Status: Widowed    Spouse Name: N/A    Number of Children: N/A  . Years of Education: N/A   Occupational History  . Not on file.   Social History Main Topics  . Smoking status: Former Smoker -- 1.00 packs/day for 5 years    Quit date: 11/14/1958  . Smokeless tobacco: Never Used  . Alcohol Use: No  . Drug Use: No  . Sexual Activity: No  Other Topics Concern  . Not on file   Social History Narrative  . No narrative on file    FAMILY HISTORY: History reviewed. No pertinent family history.  ALLERGIES:  has No Known Allergies.  MEDICATIONS:  Current Facility-Administered Medications  Medication Dose Route Frequency Provider Last Rate Last Dose  . acetaminophen (TYLENOL) tablet 650 mg  650 mg Oral Q6H PRN Robbie Lis, MD       Or  . acetaminophen (TYLENOL) suppository 650 mg  650 mg Rectal Q6H PRN Robbie Lis, MD      . apixaban Arne Cleveland) tablet 5 mg  5 mg Oral BID Kelvin Cellar, MD   5 mg at 12/09/13 1051  . atorvastatin (LIPITOR) tablet 10 mg  10 mg Oral Daily Robbie Lis, MD   10 mg at 12/09/13 1051  . cholecalciferol (VITAMIN D) tablet 2,000 Units  2,000 Units Oral Daily Robbie Lis, MD   2,000 Units at 12/09/13 [phone]  . diltiazem (CARDIZEM) 100 mg in dextrose 5 % 100 mL (1 mg/mL) infusion  5-15 mg/hr Intravenous Continuous Dianne Dun, NP   12.5 mg/hr at 12/09/13 0813  . ferrous sulfate tablet 325 mg  325 mg Oral QPC breakfast Robbie Lis, MD   325 mg at 12/09/13 0949  . insulin aspart (novoLOG) injection 0-15 Units  0-15 Units Subcutaneous TID WC Kelvin Cellar, MD      . insulin aspart (novoLOG) injection 0-5 Units  0-5 Units Subcutaneous QHS Kelvin Cellar, MD      . levothyroxine (SYNTHROID, LEVOTHROID) tablet 50 mcg  50 mcg Oral QAC breakfast Robbie Lis, MD   50 mcg at 12/09/13 0817  . LORazepam (ATIVAN) tablet 0.5 mg  0.5 mg Oral Q6H PRN Robbie Lis, MD      . metFORMIN (GLUCOPHAGE) tablet 500 mg  500 mg Oral Q breakfast Robbie Lis, MD   500 mg at 12/09/13 9622  . metFORMIN (GLUCOPHAGE-XR) 24 hr tablet 1,000 mg  1,000 mg Oral QAC supper Robbie Lis, MD      . metoprolol succinate (TOPROL-XL) 24 hr tablet 25 mg  25 mg Oral Daily Robbie Lis, MD      . ondansetron [facility]) tablet 4 mg  4 mg Oral Q6H PRN Robbie Lis, MD       Or  . ondansetron [facility]) injection 4 mg  4 mg Intravenous Q6H PRN Robbie Lis, MD      . pioglitazone (ACTOS) tablet 30 mg  30 mg Oral Q breakfast Robbie Lis, MD   30 mg at 12/09/13 2979  . piperacillin-tazobactam (ZOSYN) IVPB 3.375 g  3.375 g Intravenous 3 times per day Clovis Riley, RPH   3.375 g at 12/09/13 0116  . potassium chloride SA (K-DUR,KLOR-CON) CR tablet 20 mEq  20 mEq Oral Daily Robbie Lis, MD   20 mEq at 12/09/13 1052  . prochlorperazine (COMPAZINE) tablet 10 mg  10 mg Oral Q6H PRN Robbie Lis,  MD      . sodium chloride 0.9 % injection 3 mL  3 mL Intravenous Q12H Robbie Lis, MD   3 mL at 12/08/13 2248  . vancomycin (VANCOCIN) IVPB 750 mg/150 ml premix  750 mg Intravenous Q12H Emiliano Dyer, RPH   750 mg at 12/09/13 1051  . vitamin B-12 (CYANOCOBALAMIN) tablet 1,000 mcg  1,000 mcg Oral Daily Robbie Lis, MD   1,000 mcg at 12/09/13 [phone]  REVIEW OF SYSTEMS:   Constitutional: Denies fevers, chills or abnormal night sweats Eyes: Denies blurriness of vision, double vision or watery eyes Ears, nose, mouth, throat, and face: Denies mucositis or sore throat Respiratory: Denies cough, dyspnea or wheezes Cardiovascular: Occasional palpitations Gastrointestinal:  Denies nausea, heartburn or change in bowel habits Skin: Denies abnormal skin rashes Lymphatics: Denies new lymphadenopathy or easy bruising Neurological: Mild peripheral neuropathy numbness Behavioral/Psych: Mood is stable, no new changes  All other systems were reviewed with the patient and are negative.  PHYSICAL EXAMINATION: ECOG PERFORMANCE STATUS: 2 - Symptomatic, <50% confined to bed  Filed Vitals:   12/09/13 0800  BP:   Pulse:   Temp: 98.6 F (37 C)  Resp:    Filed Weights   12/08/13 2233 12/09/13 0500  Weight: 191 lb 12.8 oz (87 kg) 194 lb 0.1 oz (88 kg)    GENERAL:alert, no distress and comfortable SKIN: skin color, texture, turgor are normal, no rashes or significant lesions EYES: normal, conjunctiva are pink and non-injected, sclera clear OROPHARYNX:no exudate, no erythema and lips, buccal mucosa, and tongue normal  NECK: supple, thyroid normal size, non-tender, without nodularity LYMPH:  no palpable lymphadenopathy in the cervical, axillary or inguinal LUNGS: clear to auscultation and percussion with normal breathing effort HEART:  Atrial fibrillation ABDOMEN:abdomen soft, non-tender and normal bowel sounds Musculoskeletal:no cyanosis of digits and no clubbing  PSYCH: alert & oriented x 3  with fluent speech NEURO: no focal motor/sensory deficits  LABORATORY DATA:  I have reviewed the data as listed Lab Results  Component Value Date   WBC 0.6* 12/09/2013   HGB 9.4* 12/09/2013   HCT 27.7* 12/09/2013   MCV 85.0 12/09/2013   PLT 104* 12/09/2013   Lab Results  Component Value Date   NA 137 12/09/2013   K 3.4* 12/09/2013   CL 99 12/09/2013   CO2 27 12/09/2013    RADIOGRAPHIC STUDIES: I have personally reviewed the radiological reports and agreed with the findings in the report. Chest x-ray normal ASSESSMENT AND PLAN:   1. neutropenic fever: Blood cultures urine cultures were all obtained. Currently on Zosyn and vancomycin. No further fevers. Patient is going to have calmed recovery very shortly. Lungs she has Hagerman greater than 1000, if she is afebrile, antibiotics can be stopped. Results of infection could be UTI. It could also be related to count recovery from the effect of Neulasta.  2. breast cancer stage IIB on neoadjuvant chemotherapy. The doses of next chemotherapy will need to be reduced because of this neutropenic fever admission.  3. Atrial fibrillation: Patient is being managed control her heart rate and she will also be placed on anticoagulation.  4. anemia due to chemotherapy: And watch and monitor hemoglobin is 9.4 5. thrombocytopenia related to chemotherapy: Platelets are also recovering upto 104 today I will follow her while she is in the hospital and arrange for followup in the clinic.     [name], MD @T @ 11:07 AM

## 2013-12-09 NOTE — Progress Notes (Signed)
[facility] PROGRESS NOTE  Whitney [name] ZOX:[id number] DOB: [birth year] DOA: 12/08/2013 PCP: Odette Fraction, MD  Assessment/Plan: 1. Neutropenia fever -Patient with history of breast cancer undergoing systemic therapy with cyclophosphamide and doxorubicin -Reported fevers and chills at home -Chest x-ray showed no acute abnormalities, urinalysis normal, nonfocal on physical exam -Blood cultures and urine cultures have been ordered -Continue supportive care, IV fluid administration, broad-spectrum IV antibiotic therapy with IV Zosyn and Vancomycin  2.  Atrial fibrillation rapid ventricular response, new diagnosis -Patient without significant cardiac history, had reported palpitations over the past week found to be in A. fib with RVR -Continue Cardizem drip, close monitoring in the step down unit -Has history of hypothyroidism, TSH 0.551 -Transthoracic echocardiogram performed on 11/08/2013 which showed EF of 65-70% without wall motion abnormalities, grade 2 diastolic dysfunction -I suspect underlying infectious process and perhaps dehydration may have precipitated A. fib with RVR -Has a CHADVASC score or 5, having a high risk for thromboembolism, risks and benefits of anticoagulation discussed with patient and daughter at bedside -Will start Eliquis 5 mg PO BID  3. Type 2 diabetes mellitus -Perform Accu-Cheks q. a.c. and each bedtime -On metformin therapy  4.  Hypertension -Patient with history of hypertension, on multiple antihypertensive agents, started on IV Cardizem -Blood pressures soft, will discontinue lisinopril/hydrochlorothiazide  5.  Hypothyroidism -TSH within normal limits -Continue Synthroid 50 mcg by mouth daily  6. Hypokalemia -Replace with K. Dur  Code Status: Full Code Family Communication: I spoke to patient's daughters Disposition Plan: Patient from home, continue close monitoring in the step down unit   Antibiotics:  IV vancomycin  IV  Zosyn  HPI/Subjective: Patient is a pleasant 80 year old female with a past medical history of stage II breast cancer, admitted to the medicine service on 12/08/2013 presenting with generalized weakness and palpitations. Patient found to be in A. fib with RVR, started on a Cardizem drip and admitted to the step down unit. She also reported having intermittent fevers and chills at home, started on broad-spectrum IV antibiotic therapy with Zosyn and Vancomycin for neutropenic fever. This morning patient states feeling little better, she is awake alert oriented. Family members are present at bedside. She remains in A. Fib.   Objective: Filed Vitals:   12/09/13 0630  BP: 109/61  Pulse: 76  Temp:   Resp: 17    Intake/Output Summary (Last 24 hours) at 12/09/13 0820 Last data filed at 12/09/13 0600  Gross per 24 hour  Intake 1192.88 ml  Output    425 ml  Net 767.88 ml   Filed Weights   12/08/13 2233 12/09/13 0500  Weight: 87 kg (191 lb 12.8 oz) 88 kg (194 lb 0.1 oz)    Exam:   General:  Patient is in no acute distress, she is nontoxic appearing awake and alert  Cardiovascular: Irregular rate and rhythm normal S1-S2, tachycardic, no extremity edema  Respiratory: Normal respiratory effort, lungs clear to auscultation bilaterally no wheezing rhonchi or rales  Abdomen: Soft nontender nondistended  Musculoskeletal: No edema  Data Reviewed: Basic Metabolic Panel:  Recent Labs Lab 12/05/13 1425 12/08/13 1729 12/08/13 1845 12/08/13 2315 12/09/13 0342  NA 140 136*  --  137 137  K 3.6 3.2*  --  3.3* 3.4*  CL  --  94*  --  98 99  CO2 32* 29  --  28 27  GLUCOSE 159* 176*  --  198* 146*  BUN 31.6* 14  --  15 12  CREATININE 1.0 0.79  --  0.83 0.73  CALCIUM 9.3 9.3  --  8.8 8.7  MG  --   --  1.4* 1.4*  --   PHOS  --   --   --  2.5  --    Liver Function Tests:  Recent Labs Lab 12/05/13 1425 12/08/13 1729 12/08/13 2315 12/09/13 0342  AST 9 9 7 8   ALT 6 10 9 8   ALKPHOS  79 57 51 52  BILITOT 0.67 0.5 0.4 0.4  PROT 5.7* 6.0 5.4* 5.5*  ALBUMIN 3.0* 2.9* 2.5* 2.5*   No results found for this basename: LIPASE, AMYLASE,  in the last 168 hours No results found for this basename: AMMONIA,  in the last 168 hours CBC:  Recent Labs Lab 12/05/13 1425 12/08/13 1729 12/08/13 2315 12/09/13 0342  WBC 2.7* 0.3* 0.4* 0.6*  NEUTROABS 2.4 0.2* 0.1*  --   HGB 10.3* 9.5* 8.8* 9.4*  HCT 32.7* 27.7* 26.2* 27.7*  MCV 88.0 84.2 84.8 85.0  PLT 138* 106* 91* 104*   Cardiac Enzymes:  Recent Labs Lab 12/08/13 1849  TROPONINI <0.30   BNP (last 3 results) No results found for this basename: PROBNP,  in the last 8760 hours CBG: No results found for this basename: GLUCAP,  in the last 168 hours  Recent Results (from the past 240 hour(s))  MRSA PCR SCREENING     Status: None   Collection Time    12/08/13 10:55 PM      Result Value Ref Range Status   MRSA by PCR NEGATIVE  NEGATIVE Final   Comment:            The GeneXpert MRSA Assay (FDA     approved for NASAL specimens     only), is one component of a     comprehensive MRSA colonization     surveillance program. It is not     intended to diagnose MRSA     infection nor to guide or     monitor treatment for     MRSA infections.     Studies: Dg Chest 2 View  12/08/2013   CLINICAL DATA:  Weakness and fever starting this morning, history breast cancer with most recent chemotherapy on 11/30/2013, hypertension, diabetes, former smoker  EXAM: CHEST  2 VIEW  COMPARISON:  11/15/2013  FINDINGS: RIGHT jugular Port-A-Cath with tip projecting over SVC.  Enlargement of cardiac silhouette.  Calcified tortuous thoracic aorta.  Pulmonary vascularity normal.  Slight prominent RIGHT superior mediastinal soft tissues unchanged.  Lungs clear.  No pleural effusion or pneumothorax.  No acute osseous findings.  IMPRESSION: Enlargement of cardiac silhouette.  No acute abnormalities.   Electronically Signed   By: Lavonia Dana M.D.   On:  12/08/2013 18:48    Scheduled Meds: . aspirin  81 mg Oral Daily  . atorvastatin  10 mg Oral Daily  . cholecalciferol  2,000 Units Oral Daily  . ferrous sulfate  325 mg Oral QPC breakfast  . furosemide  40 mg Oral Daily  . insulin aspart  0-15 Units Subcutaneous TID WC  . insulin aspart  0-5 Units Subcutaneous QHS  . levothyroxine  50 mcg Oral QAC breakfast  . metFORMIN  500 mg Oral Q breakfast  . metFORMIN  1,000 mg Oral QAC supper  . metoprolol succinate  25 mg Oral Daily  . pioglitazone  30 mg Oral Q breakfast  . piperacillin-tazobactam (ZOSYN)  IV  3.375 g Intravenous 3 times per day  . potassium chloride SA  20 mEq  Oral Daily  . potassium chloride  40 mEq Oral Once  . sodium chloride  3 mL Intravenous Q12H  . vancomycin  750 mg Intravenous Q12H  . vitamin B-12  1,000 mcg Oral Daily   Continuous Infusions: . sodium chloride 100 mL/hr at 12/08/13 1849  . sodium chloride 75 mL/hr at 12/08/13 2238  . diltiazem (CARDIZEM) infusion 12.5 mg/hr (12/09/13 0813)    Principal Problem:   Neutropenic fever Active Problems:   Type II or unspecified type diabetes mellitus without mention of complication, not stated as uncontrolled   Essential hypertension, benign   Pure hypercholesterolemia   Breast cancer of upper-outer quadrant of right female breast   Anemia of chronic disease   Thrombocytopenia   Hypokalemia   Pancytopenia due to antineoplastic chemotherapy   New onset atrial fibrillation   Hypothyroidism    Time spent: 45 min    Kelvin Cellar  [facility] Pager [phone]. If 7PM-7AM, please contact night-coverage at [email or web address], password [facility] 12/09/2013, 8:20 AM  LOS: 1 day

## 2013-12-09 NOTE — Progress Notes (Signed)
CRITICAL VALUE ALERT  Critical value received:  Positive blood culture aerobic bottle positive for gram negative rods  Date of notification:  12/09/2013  Time of notification: 1815  Critical value read back:Yes.    Nurse who received alert:  Maretta Bees, rn  MD notified (1st page):  Dr. Coralyn Pear  Time of first page:  39  MD notified (2nd page):  Time of second page:  Responding MD: Dr. Coralyn Pear  Time MD responded:  [phone]

## 2013-12-09 NOTE — Progress Notes (Signed)
ANTICOAGULATION CONSULT NOTE - Initial Consult  Pharmacy Consult for Eliquis Indication: atrial fibrillation  No Known Allergies  Patient Measurements: Height: 5\' 7"  (170.2 cm) Weight: 194 lb 0.1 oz (88 kg) IBW/kg (Calculated) : 61.6   Vital Signs: Temp: 99.2 F (37.3 C) (09/26 0000) Temp src: Oral (09/26 0000) BP: 109/61 mmHg (09/26 0630) Pulse Rate: 76 (09/26 0630)  Labs:  Recent Labs  12/08/13 1729 12/08/13 1849 12/08/13 2315 12/09/13 0342  HGB 9.5*  --  8.8* 9.4*  HCT 27.7*  --  26.2* 27.7*  PLT 106*  --  91* 104*  APTT  --   --  30  --   LABPROT  --   --  14.0  --   INR  --   --  1.08  --   CREATININE 0.79  --  0.83 0.73  TROPONINI  --  <0.30  --   --     Estimated Creatinine Clearance: 60.7 ml/min (by C-G formula based on Cr of 0.73).   Medical History: Past Medical History  Diagnosis Date  . Hypertension   . Hyperlipidemia   . Diabetes mellitus without complication   . Thyroid disease     hypothyroidism  . Anemia   . Former smoker   . Multinodular goiter   . Complication of anesthesia     slow to wake up  . Cancer     right breast     Assessment: 85 yoF admitted with neutropenic fever found to be in afib with RVR.  Due to patient's high risk of thromboembolism (CHADVASC score 5), MD wishes to begin anticoagulation with Eliquis.   Patient weight 88 kg and SCr 0.73.  Patient does not meet criteria for reduced dose.  CBC: Hgb and platelets low but stable.  Goal of Therapy:  Reduce risk of stroke Monitor platelets by anticoagulation protocol: Yes   Plan:  1.  Apixaban 5 mg PO BID. 2.  Pharmacist education to follow.  Hershal Coria 12/09/2013,8:35 AM

## 2013-12-09 NOTE — Progress Notes (Signed)
Pt with bp of 85/63. Hr ranging btw 88-94 and then goes up to 117s with activities. cardizem gtt stopped per order parameters and MD made aware. New orders given to hold metoprolol and give a bolus of fluids. Done. Vwilliams,rn.

## 2013-12-10 DIAGNOSIS — E876 Hypokalemia: Secondary | ICD-10-CM

## 2013-12-10 DIAGNOSIS — A419 Sepsis, unspecified organism: Secondary | ICD-10-CM

## 2013-12-10 DIAGNOSIS — E7800 Pure hypercholesterolemia, unspecified: Secondary | ICD-10-CM

## 2013-12-10 DIAGNOSIS — E039 Hypothyroidism, unspecified: Secondary | ICD-10-CM

## 2013-12-10 DIAGNOSIS — I4891 Unspecified atrial fibrillation: Secondary | ICD-10-CM

## 2013-12-10 DIAGNOSIS — Z01810 Encounter for preprocedural cardiovascular examination: Secondary | ICD-10-CM

## 2013-12-10 DIAGNOSIS — I5032 Chronic diastolic (congestive) heart failure: Secondary | ICD-10-CM

## 2013-12-10 LAB — CBC
HCT: 26.5 % — ABNORMAL LOW (ref 36.0–46.0)
Hemoglobin: 8.9 g/dL — ABNORMAL LOW (ref 12.0–15.0)
MCH: 28.7 pg (ref 26.0–34.0)
MCHC: 33.6 g/dL (ref 30.0–36.0)
MCV: 85.5 fL (ref 78.0–100.0)
Platelets: 90 10*3/uL — ABNORMAL LOW (ref 150–400)
RBC: 3.1 MIL/uL — ABNORMAL LOW (ref 3.87–5.11)
RDW: 16.2 % — ABNORMAL HIGH (ref 11.5–15.5)
WBC: 2.1 10*3/uL — ABNORMAL LOW (ref 4.0–10.5)

## 2013-12-10 LAB — BASIC METABOLIC PANEL
Anion gap: 10 (ref 5–15)
BUN: 9 mg/dL (ref 6–23)
CO2: 26 mEq/L (ref 19–32)
Calcium: 8.6 mg/dL (ref 8.4–10.5)
Chloride: 102 mEq/L (ref 96–112)
Creatinine, Ser: 0.65 mg/dL (ref 0.50–1.10)
GFR calc Af Amer: >90 mL/min (ref >90)
GFR calc non Af Amer: 80 mL/min — ABNORMAL LOW (ref >90)
Glucose, Bld: 145 mg/dL — ABNORMAL HIGH (ref 70–99)
Potassium: 3.6 mEq/L — ABNORMAL LOW (ref 3.7–5.3)
Sodium: 138 mEq/L (ref 137–147)

## 2013-12-10 LAB — GLUCOSE, CAPILLARY
Glucose-Capillary: 127 mg/dL — ABNORMAL HIGH (ref 70–99)
Glucose-Capillary: 133 mg/dL — ABNORMAL HIGH (ref 70–99)
Glucose-Capillary: 120 mg/dL — ABNORMAL HIGH (ref 70–99)
Glucose-Capillary: 150 mg/dL — ABNORMAL HIGH (ref 70–99)
Glucose-Capillary: 166 mg/dL — ABNORMAL HIGH (ref 70–99)

## 2013-12-10 LAB — URINE CULTURE
Colony Count: NO GROWTH
Culture: NO GROWTH

## 2013-12-10 MED ORDER — AMIODARONE HCL IN DEXTROSE 360-4.14 MG/200ML-% IV SOLN
30.0000 mg/h | INTRAVENOUS | Status: DC
  Administered 2013-12-10 – 2013-12-12 (×4): 30 mg/h via INTRAVENOUS
  Filled 2013-12-10 (×5): qty 200

## 2013-12-10 MED ORDER — METOPROLOL TARTRATE 50 MG PO TABS
100.0000 mg | ORAL_TABLET | Freq: Two times a day (BID) | ORAL | Status: DC
  Administered 2013-12-10 – 2013-12-14 (×9): 100 mg via ORAL
  Filled 2013-12-10: qty 4
  Filled 2013-12-10: qty 2
  Filled 2013-12-10 (×2): qty 4
  Filled 2013-12-10: qty 8
  Filled 2013-12-10: qty 4
  Filled 2013-12-10 (×3): qty 2
  Filled 2013-12-10: qty 4

## 2013-12-10 MED ORDER — AMIODARONE LOAD VIA INFUSION
150.0000 mg | Freq: Once | INTRAVENOUS | Status: AC
  Administered 2013-12-10: 150 mg via INTRAVENOUS
  Filled 2013-12-10: qty 83.34

## 2013-12-10 MED ORDER — AMIODARONE HCL IN DEXTROSE 360-4.14 MG/200ML-% IV SOLN
60.0000 mg/h | INTRAVENOUS | Status: AC
  Administered 2013-12-10: 60 mg/h via INTRAVENOUS
  Filled 2013-12-10: qty 200

## 2013-12-10 MED ORDER — AMIODARONE IV BOLUS ONLY 150 MG/100ML
INTRAVENOUS | Status: AC
  Filled 2013-12-10: qty 100

## 2013-12-10 NOTE — Progress Notes (Signed)
SUBJECTIVE: No further fevers. Currently on diltiazem drip for A. fib. Overall feeling better.  OBJECTIVE PHYSICAL EXAMINATION: ECOG PERFORMANCE STATUS: 2 - Symptomatic, <50% confined to bed  Filed Vitals:   12/10/13 0800  BP: 144/70  Pulse: 100  Temp: 98.4 F (36.9 C)  Resp: 23   Filed Weights   12/08/13 2233 12/09/13 0500 12/10/13 0459  Weight: 191 lb 12.8 oz (87 kg) 194 lb 0.1 oz (88 kg) 195 lb 1.7 oz (88.5 kg)    GENERAL:alert, no distress and comfortable SKIN: skin color, texture, turgor are normal, no rashes or significant lesions EYES: normal, Conjunctiva are pink and non-injected, sclera clear OROPHARYNX:no exudate, no erythema and lips, buccal mucosa, and tongue normal  NECK: supple, thyroid normal size, non-tender, without nodularity LYMPH:  no palpable lymphadenopathy in the cervical, axillary or inguinal LUNGS: clear to auscultation and percussion with normal breathing effort HEART: Atrial fibrillation ABDOMEN:abdomen soft, non-tender and normal bowel sounds Musculoskeletal:no cyanosis of digits and no clubbing  NEURO: alert & oriented x 3 with fluent speech, no focal motor/sensory deficits  LABORATORY DATA:  I have reviewed the data as listed CMP     Component Value Date/Time   NA 138 12/10/2013 0335   NA 140 12/05/2013 1425   K 3.6* 12/10/2013 0335   K 3.6 12/05/2013 1425   CL 102 12/10/2013 0335   CO2 26 12/10/2013 0335   CO2 32* 12/05/2013 1425   GLUCOSE 145* 12/10/2013 0335   GLUCOSE 159* 12/05/2013 1425   BUN 9 12/10/2013 0335   BUN 31.6* 12/05/2013 1425   CREATININE 0.65 12/10/2013 0335   CREATININE 1.0 12/05/2013 1425   CREATININE 0.88 07/27/2013 0913   CALCIUM 8.6 12/10/2013 0335   CALCIUM 9.3 12/05/2013 1425   PROT 5.5* 12/09/2013 0342   PROT 5.7* 12/05/2013 1425   ALBUMIN 2.5* 12/09/2013 0342   ALBUMIN 3.0* 12/05/2013 1425   AST 8 12/09/2013 0342   AST 9 12/05/2013 1425   ALT 8 12/09/2013 0342   ALT 6 12/05/2013 1425   ALKPHOS 52 12/09/2013 0342   ALKPHOS 79  12/05/2013 1425   BILITOT 0.4 12/09/2013 0342   BILITOT 0.67 12/05/2013 1425   GFRNONAA 80* 12/10/2013 0335   GFRNONAA 61 07/27/2013 0913   GFRAA >90 12/10/2013 0335   GFRAA 70 07/27/2013 0913      Lab Results  Component Value Date   WBC 2.1* 12/10/2013   HGB 8.9* 12/10/2013   HCT 26.5* 12/10/2013   MCV 85.5 12/10/2013   PLT 90* 12/10/2013   NEUTROABS 0.1* 12/08/2013    ASSESSMENT AND PLAN: 1. neutropenic fever: The patient has a white count 0.1 but she does not have an absolute neutrophil count for today with a white count of 2.1. I believe she is no longer neutropenic. I will obtain a CBC with differential from tomorrow. We'll discontinue vancomycin since MRSA cultures were negative.  2. atrial fibrillation: We appreciate cardiology consultation. Based on our discussion, patient is not in congestive heart failure, since her heart rate is still 110, the plan is to change her to amiodarone and see if she converts. If she does not convert to sinus rhythm, the plan is to do cardioversion on Tuesday.  3. we will have to postpone our plan for chemotherapy for next Thursday until we get cardiology clearance to reinitiate her systemic chemotherapy. If we cannot get any further chemotherapy, then we will plan on getting her to surgery for breast cancer.  4. anemia and thrombocytopenia due to chemotherapy. She  does not need transfusion since she is asymptomatic.

## 2013-12-10 NOTE — Progress Notes (Signed)
[facility] PROGRESS NOTE  Whitney [name] BDZ:[id number] DOB: 1928/01/01 DOA: 12/08/2013 PCP: Odette Fraction, MD  Assessment/Plan: 1. Neutropenia fever -Patient with history of breast cancer undergoing systemic therapy with cyclophosphamide and doxorubicin -Reported fevers and chills at home -Chest x-ray showed no acute abnormalities, urinalysis normal, nonfocal on physical exam -Blood cultures positive 1 out of 2 for gram negative rods, blood cultures repeated.  -Will check a 2D echo -Continue supportive care, IV fluid administration, broad-spectrum IV antibiotic therapy with IV Zosyn and Vancomycin  2. Sepsis -Present on admission, evidenced by positive blood cultures, HR's in the 130's, neutropenia, functional decline -Plan to repeat blood cultures, continue emperic IV antibiotic therapy with Zosyn and Vancomycin.  -Will check a 2D echo -CXR and U/A unremarkable, nonfocal on exam.  -Will follow up on cultures.   3.  Atrial fibrillation rapid ventricular response, new diagnosis -Patient without significant cardiac history, had reported palpitations over the past week found to be in A. fib with RVR -Continue Cardizem drip, close monitoring in the step down unit -Has history of hypothyroidism, TSH 0.551 -Transthoracic echocardiogram performed on 11/08/2013 which showed EF of 65-70% without wall motion abnormalities, grade 2 diastolic dysfunction -I suspect underlying infectious process and perhaps dehydration may have precipitated A. fib with RVR -Has a CHADVASC score or 5, having a high risk for thromboembolism, risks and benefits of anticoagulation discussed with patient and daughter at bedside -Will start Eliquis 5 mg PO BID -Remains in A-fib with uncontrolled rates despite IV Cardizem over the past day. Overnight HR's ranging from 45 to 137, sick sinus syndrome a consideration, will consult cardiology for further recommendations.    4. Type 2 diabetes mellitus -Perform  Accu-Cheks q. a.c. and each bedtime -On metformin therapy  5.  Hypertension -Patient with history of hypertension, on multiple antihypertensive agents, started on IV Cardizem -Blood pressures stable, will discontinue lisinopril/hydrochlorothiazide  6.  Hypothyroidism -TSH within normal limits -Continue Synthroid 50 mcg by mouth daily  7. Hypokalemia -Replace with K. Dur, potassium improved to 3.6. Will give an additional 80meq  Code Status: Full Code Family Communication: I spoke to patient's daughters Disposition Plan: Patient from home, continue close monitoring in the step down unit   Antibiotics:  IV vancomycin  IV Zosyn  HPI/Subjective: Patient is a pleasant 80 year old female with a past medical history of stage II breast cancer, admitted to the medicine service on 12/08/2013 presenting with generalized weakness and palpitations. Patient found to be in A. fib with RVR, started on a Cardizem drip and admitted to the step down unit. She also reported having intermittent fevers and chills at home, started on broad-spectrum IV antibiotic therapy with Zosyn and Vancomycin for neutropenic fever. This morning patient states feeling little better, she is awake alert oriented. Family members are present at bedside. She remains in A. Fib.   Objective: Filed Vitals:   12/10/13 0500  BP: 125/91  Pulse: 99  Temp:   Resp: 25    Intake/Output Summary (Last 24 hours) at 12/10/13 0811 Last data filed at 12/10/13 0600  Gross per 24 hour  Intake    400 ml  Output    750 ml  Net   -350 ml   Filed Weights   12/08/13 2233 12/09/13 0500 12/10/13 0459  Weight: 87 kg (191 lb 12.8 oz) 88 kg (194 lb 0.1 oz) 88.5 kg (195 lb 1.7 oz)    Exam:   General:  Patient is in no acute distress, she is nontoxic appearing awake  and alert  Cardiovascular: Irregular rate and rhythm normal S1-S2, tachycardic, no extremity edema  Respiratory: Normal respiratory effort, lungs clear to auscultation  bilaterally no wheezing rhonchi or rales  Abdomen: Soft nontender nondistended  Musculoskeletal: No edema  Data Reviewed: Basic Metabolic Panel:  Recent Labs Lab 12/05/13 1425 12/08/13 1729 12/08/13 1845 12/08/13 2315 12/09/13 0342 12/10/13 0335  NA 140 136*  --  137 137 138  K 3.6 3.2*  --  3.3* 3.4* 3.6*  CL  --  94*  --  98 99 102  CO2 32* 29  --  28 27 26   GLUCOSE 159* 176*  --  198* 146* 145*  BUN 31.6* 14  --  15 12 9   CREATININE 1.0 0.79  --  0.83 0.73 0.65  CALCIUM 9.3 9.3  --  8.8 8.7 8.6  MG  --   --  1.4* 1.4*  --   --   PHOS  --   --   --  2.5  --   --    Liver Function Tests:  Recent Labs Lab 12/05/13 1425 12/08/13 1729 12/08/13 2315 12/09/13 0342  AST 9 9 7 8   ALT 6 10 9 8   ALKPHOS 79 57 51 52  BILITOT 0.67 0.5 0.4 0.4  PROT 5.7* 6.0 5.4* 5.5*  ALBUMIN 3.0* 2.9* 2.5* 2.5*   No results found for this basename: LIPASE, AMYLASE,  in the last 168 hours No results found for this basename: AMMONIA,  in the last 168 hours CBC:  Recent Labs Lab 12/05/13 1425 12/08/13 1729 12/08/13 2315 12/09/13 0342 12/10/13 0335  WBC 2.7* 0.3* 0.4* 0.6* 2.1*  NEUTROABS 2.4 0.2* 0.1*  --   --   HGB 10.3* 9.5* 8.8* 9.4* 8.9*  HCT 32.7* 27.7* 26.2* 27.7* 26.5*  MCV 88.0 84.2 84.8 85.0 85.5  PLT 138* 106* 91* 104* 90*   Cardiac Enzymes:  Recent Labs Lab 12/08/13 1849  TROPONINI <0.30   BNP (last 3 results) No results found for this basename: PROBNP,  in the last 8760 hours CBG:  Recent Labs Lab 12/09/13 0802 12/09/13 1311 12/09/13 1618 12/09/13 2257  GLUCAP 140* 135* 106* 127*    Recent Results (from the past 240 hour(s))  CULTURE, BLOOD (ROUTINE X 2)     Status: None   Collection Time    12/08/13  6:30 PM      Result Value Ref Range Status   Specimen Description BLOOD LEFT ANTECUBITAL   Final   Special Requests BOTTLES DRAWN AEROBIC AND ANAEROBIC 5ML   Final   Culture  Setup Time     Final   Value: 12/08/2013 22:25     Performed at [facility]   Culture     Final   Value:        BLOOD CULTURE RECEIVED NO GROWTH TO DATE CULTURE WILL BE HELD FOR 5 DAYS BEFORE ISSUING A FINAL NEGATIVE REPORT     Performed at [facility]   Report Status PENDING   Incomplete  CULTURE, BLOOD (ROUTINE X 2)     Status: None   Collection Time    12/08/13  6:49 PM      Result Value Ref Range Status   Specimen Description BLOOD RIGHT ANTECUBITAL   Final   Special Requests BOTTLES DRAWN AEROBIC AND ANAEROBIC 5ML   Final   Culture  Setup Time     Final   Value: 12/08/2013 22:25     Performed at [facility]  Culture     Final   Value: GRAM NEGATIVE RODS     Note: Gram Stain Report Called to,Read Back By and Verified With: VERA WILLIAMS 12/09/13 @ 6:16PM BY RUSCOE A.     Performed at [facility]   Report Status PENDING   Incomplete  URINE CULTURE     Status: None   Collection Time    12/08/13  6:58 PM      Result Value Ref Range Status   Specimen Description URINE, CATHETERIZED   Final   Special Requests NONE   Final   Culture  Setup Time     Final   Value: 12/09/2013 00:30     Performed at [facility]     Final   Value: NO GROWTH     Performed at [facility]   Culture     Final   Value: NO GROWTH     Performed at [facility]   Report Status 12/10/2013 FINAL   Final  MRSA PCR SCREENING     Status: None   Collection Time    12/08/13 10:55 PM      Result Value Ref Range Status   MRSA by PCR NEGATIVE  NEGATIVE Final   Comment:            The GeneXpert MRSA Assay (FDA     approved for NASAL specimens     only), is one component of a     comprehensive MRSA colonization     surveillance program. It is not     intended to diagnose MRSA     infection nor to guide or     monitor treatment for     MRSA infections.     Studies: Dg Chest 2 View  12/08/2013   CLINICAL DATA:  Weakness and fever starting this morning, history breast cancer with most recent  chemotherapy on 11/30/2013, hypertension, diabetes, former smoker  EXAM: CHEST  2 VIEW  COMPARISON:  11/15/2013  FINDINGS: RIGHT jugular Port-A-Cath with tip projecting over SVC.  Enlargement of cardiac silhouette.  Calcified tortuous thoracic aorta.  Pulmonary vascularity normal.  Slight prominent RIGHT superior mediastinal soft tissues unchanged.  Lungs clear.  No pleural effusion or pneumothorax.  No acute osseous findings.  IMPRESSION: Enlargement of cardiac silhouette.  No acute abnormalities.   Electronically Signed   By: Lavonia Dana M.D.   On: 12/08/2013 18:48    Scheduled Meds: . apixaban  5 mg Oral BID  . atorvastatin  10 mg Oral Daily  . cholecalciferol  2,000 Units Oral Daily  . ferrous sulfate  325 mg Oral QPC breakfast  . insulin aspart  0-15 Units Subcutaneous TID WC  . insulin aspart  0-5 Units Subcutaneous QHS  . levothyroxine  50 mcg Oral QAC breakfast  . metFORMIN  500 mg Oral Q breakfast  . metFORMIN  1,000 mg Oral QAC supper  . metoprolol tartrate  50 mg Oral BID  . pioglitazone  30 mg Oral Q breakfast  . piperacillin-tazobactam (ZOSYN)  IV  3.375 g Intravenous 3 times per day  . potassium chloride SA  20 mEq Oral Daily  . sodium chloride  3 mL Intravenous Q12H  . vancomycin  750 mg Intravenous Q12H  . vitamin B-12  1,000 mcg Oral Daily   Continuous Infusions: . diltiazem (CARDIZEM) infusion 10 mg/hr (12/10/13 0445)    Principal Problem:   Neutropenic fever Active Problems:   Type II or unspecified  type diabetes mellitus without mention of complication, not stated as uncontrolled   Essential hypertension, benign   Pure hypercholesterolemia   Breast cancer of upper-outer quadrant of right female breast   Anemia of chronic disease   Thrombocytopenia   Hypokalemia   Pancytopenia due to antineoplastic chemotherapy   New onset atrial fibrillation   Hypothyroidism    Time spent: 45 min    Kelvin Cellar  [facility] Pager [phone]. If 7PM-7AM,  please contact night-coverage at [email or web address], password [facility] 12/10/2013, 8:11 AM  LOS: 2 days

## 2013-12-10 NOTE — Discharge Instructions (Signed)

## 2013-12-10 NOTE — Progress Notes (Signed)
Nutrition Brief Note  Patient identified on the Malnutrition Screening Tool (MST) Report  Wt Readings from Last 15 Encounters:  12/10/13 195 lb 1.7 oz (88.5 kg)  12/05/13 198 lb 6.4 oz (89.994 kg)  11/30/13 199 lb 1.6 oz (90.311 kg)  11/24/13 204 lb 12.8 oz (92.897 kg)  11/16/13 201 lb 1 oz (91.201 kg)  11/14/13 202 lb 9.6 oz (91.899 kg)  11/13/13 203 lb 3.2 oz (92.171 kg)  11/08/13 202 lb 11.2 oz (91.944 kg)  11/01/13 202 lb 6.4 oz (91.808 kg)  08/09/13 202 lb 9.6 oz (91.899 kg)  07/27/13 205 lb (92.987 kg)  04/07/13 206 lb 4.8 oz (93.577 kg)  12/01/12 201 lb 6.4 oz (91.354 kg)    Body mass index is 30.55 kg/(m^2). Patient meets criteria for obesity, class 1, based on current BMI.   Current diet order is CHO Mod Medium, patient is consuming approximately 90-100% of meals at this time. Labs and medications reviewed.   Pt admitted with weakness.  Pt reports 5 lbs weight loss over the past 1 month which is not clinically significant.  Pt typically eats well without complaint.  Pt is currently receiving chemotherapy for stage 2 breast cancer. Nurse confirms pt is eating well.  No nutrition interventions warranted at this time. If nutrition issues arise, please consult RD.   Brynda Greathouse, MS RD LDN Clinical Inpatient Dietitian Weekend/After hours pager: [phone]

## 2013-12-10 NOTE — Consult Note (Addendum)
CARDIOLOGY CONSULT NOTE   Patient ID: Whitney [name] MRN: [id number], DOB/AGE: 1927/06/04   Admit date: 12/08/2013 Date of Consult: 12/10/2013  Primary Physician: Odette Fraction, MD Primary Cardiologist: Dorothy Spark  Reason for consult:  A-fib with RVR  Problem List  Past Medical History  Diagnosis Date  . Hypertension   . Hyperlipidemia   . Diabetes mellitus without complication   . Thyroid disease     hypothyroidism  . Anemia   . Former smoker   . Multinodular goiter   . Complication of anesthesia     slow to wake up  . Cancer     right breast    Past Surgical History  Procedure Laterality Date  . Eye surgery  12/22/2009  . Abdominal hysterectomy    . Portacath placement N/A 11/15/2013    Procedure: INSERTION PORT-A-CATH WITH ULTRA SOUND AND FLOROSCOPY;  Surgeon: [name], MD;  Location: [facility];  Service: General;  Laterality: N/A;     Allergies  No Known Allergies  HPI   Whitney [name] 80 y.o. female has a history of stage II B. right-sided breast cancer and she is being treated with neoadjuvant chemotherapy with dose dense Adriamycin and Cytoxan. She received cycle 2 of chemotherapy on 11/28/2013. She tolerated this treatment extremely well. She came for a follow up visit on 12/03/2013 and felt fine without any problems or concerns. She called in on 12/08/2013 complaining of low-grade temperature.  She is currently having neutropenic fever, 1 of the blood cultures grew G negative rod. She was also started on broad-spectrum antibiotics with Zosyn and vancomycin.  She was found to be in atrial fibrillation with RVR and she was started on Cardizem drip for rate control currently on 15 mg/hr with ventricular rates 100-105 BPM. There was short discontinuation of Cardizem drip for hypotension.  The patient received the first 2 doses of adriamycin in the last 2 weeks - total dose 120 mg/m2. The oncologist Dr Lindi Adie is asking about continuation of  Adriamycin, followed by HER2-targeting agents or a possible surgery.   Inpatient Medications  . apixaban  5 mg Oral BID  . atorvastatin  10 mg Oral Daily  . cholecalciferol  2,000 Units Oral Daily  . ferrous sulfate  325 mg Oral QPC breakfast  . insulin aspart  0-15 Units Subcutaneous TID WC  . insulin aspart  0-5 Units Subcutaneous QHS  . levothyroxine  50 mcg Oral QAC breakfast  . metFORMIN  500 mg Oral Q breakfast  . metFORMIN  1,000 mg Oral QAC supper  . metoprolol tartrate  50 mg Oral BID  . pioglitazone  30 mg Oral Q breakfast  . piperacillin-tazobactam (ZOSYN)  IV  3.375 g Intravenous 3 times per day  . potassium chloride SA  20 mEq Oral Daily  . sodium chloride  3 mL Intravenous Q12H  . vancomycin  750 mg Intravenous Q12H  . vitamin B-12  1,000 mcg Oral Daily    Family History History reviewed. No pertinent family history.   Social History History   Social History  . Marital Status: Widowed    Spouse Name: N/A    Number of Children: N/A  . Years of Education: N/A   Occupational History  . Not on file.   Social History Main Topics  . Smoking status: Former Smoker -- 1.00 packs/day for 5 years    Quit date: 11/14/1958  . Smokeless tobacco: Never Used  . Alcohol Use: No  . Drug Use: No  .  Sexual Activity: No   Other Topics Concern  . Not on file   Social History Narrative  . No narrative on file     Review of Systems  General:  No chills, fever, night sweats or weight changes.  Cardiovascular:  No chest pain, dyspnea on exertion, edema, orthopnea, palpitations, paroxysmal nocturnal dyspnea. Dermatological: No rash, lesions/masses Respiratory: No cough, dyspnea Urologic: No hematuria, dysuria Abdominal:   No nausea, vomiting, diarrhea, bright red blood per rectum, melena, or hematemesis Neurologic:  No visual changes, wkns, changes in mental status. All other systems reviewed and are otherwise negative except as noted above.  Physical Exam  Blood  pressure 144/70, pulse 100, temperature 98.7 F (37.1 C), temperature source Oral, resp. rate 23, height 5' 7"  (1.702 m), weight 195 lb 1.7 oz (88.5 kg), SpO2 96.00%.  General: Pleasant, NAD Psych: Normal affect. Neuro: Alert and oriented X 3. Moves all extremities spontaneously. HEENT: Normal  Neck: Supple without bruits or JVD. Lungs:  Resp regular and unlabored, CTA. Heart: RRR no s3, s4, or murmurs. Abdomen: Soft, non-tender, non-distended, BS + x 4.  Extremities: No clubbing, cyanosis or edema. DP/PT/Radials 2+ and equal bilaterally.  Labs  Recent Labs  12/08/13 1849  TROPONINI <0.30   Lab Results  Component Value Date   WBC 2.1* 12/10/2013   HGB 8.9* 12/10/2013   HCT 26.5* 12/10/2013   MCV 85.5 12/10/2013   PLT 90* 12/10/2013    Recent Labs Lab 12/09/13 0342 12/10/13 0335  NA 137 138  K 3.4* 3.6*  CL 99 102  CO2 27 26  BUN 12 9  CREATININE 0.73 0.65  CALCIUM 8.7 8.6  PROT 5.5*  --   BILITOT 0.4  --   ALKPHOS 52  --   ALT 8  --   AST 8  --   GLUCOSE 146* 145*   Lab Results  Component Value Date   CHOL 156 04/07/2013   HDL 78.80 04/07/2013   LDLCALC 66 04/07/2013   TRIG 54.0 04/07/2013   Radiology/Studies  Dg Chest 2 View  12/08/2013   CLINICAL DATA:  Weakness and fever starting this morning, history breast cancer with most recent chemotherapy  IMPRESSION: Enlargement of cardiac silhouette.  No acute abnormalities.     Echocardiogram - Left ventricle: The cavity size was normal. Systolic function was vigorous. The estimated ejection fraction was in the range of 65% to 70%. Wall motion was normal; there were no regional wall motion abnormalities. Features are consistent with a pseudonormal left ventricular filling pattern, with concomitant abnormal relaxation and increased filling pressure (grade 2 diastolic dysfunction). - Aortic valve: Trileaflet; normal thickness leaflets. There was mild regurgitation. - Aortic root: The aortic root was normal in  size. - Mitral valve: Calcified annulus. There was mild regurgitation. - Left atrium: The atrium was severely dilated. - Right ventricle: Systolic function was normal. - Right atrium: The atrium was moderately dilated. - Tricuspid valve: There was moderate regurgitation. - Pulmonic valve: There was no regurgitation. - Pulmonary arteries: Systolic pressure was within the normal range. - Pericardium, extracardiac: There was no pericardial effusion.  Impressions: - Normal biventricular size and systolic function. Pseudonormal pattern of diastolic dysfunction with elevated filling pressures. Bi-atrial enlargement. Mild mitral and moderate tricuspid regurgitation.  ECG: A-fib with RVR     ASSESSMENT AND PLAN  80 y.o. female has a history of stage II B. right-sided breast cancer, s/p neoadjuvant chemotherapy with dose dense Adriamycin and Cytoxan. She received cycle 2 of chemotherapy on 11/28/2013.  New diagnosis of a-fib on this admission, treated for neutropenic fever.    1. Atrial fibrillation rapid ventricular response, new diagnosis  - no prior cardiac history, however echo on 8/26 showed grade 2 diastolic dysfunction, elevated filling pressures and severely enlarged left atrium - I assume her predisposition together with neutropenic fever and sepsis triggered a-fib - she is not tolerating Cardizem with hypotension and still tachycardic, we will start loading with amiodarone - if she doesn't cardiovert, we will plan for DCCV on Tuesday 9/29 - TSH 0.5 - Has a CHADVASC score or 5, having a high risk for thromboembolism, agree with starting Eliquis 5 mg PO BID   2. Chronic diastolic CHF - grade 2 diastolic dysfunction with elevated filling pressures - currently euvolemic, it is important that we either cardiovert or rate control as her risk of going into acute CHF is ery high  3. Chemotherapy - the patient received total total dose of 120 mg/m2 of adriamycin so far (max allowed 300  mg/m2), we will repeat echo with strain in 1 month to decide about continuation of adriamycin and also HER2 targeting agents. Those have arrhythmias as possible side effects, we will decide on their appropriate use depending how we are able to manage her a-fib  4. Preoperative evaluation for possible mastectomy - currently the patient has no signs of acute heart failure or angina, she is able to achieve 4 METS. Once we cardiovert or rate control her a-fib, there should be no contraindication for her to undergo mastectomy.   5. Hypertension  - increase metoprolol to 100 mg po BID  6. Hypothyroidism  -TSH within normal limits  -Continue Synthroid 50 mcg by mouth daily  - repeat in 3-4 weeks if we continue amiodarone  6. Hypokalemia  -Replace with K. Dur, potassium improved to 3.6. Will give an additional 74mq  7. DM2 - managed by primary care  8. Hyperlipidemia - on atorvastatin  Signed, NDorothy Spark MD, FPeachtree Orthopa[facility] Surgery [facility] At [facility]9/27/2015, 8:53 AM

## 2013-12-11 ENCOUNTER — Other Ambulatory Visit: Payer: Self-pay

## 2013-12-11 LAB — GLUCOSE, CAPILLARY
Glucose-Capillary: 139 mg/dL — ABNORMAL HIGH (ref 70–99)
Glucose-Capillary: 115 mg/dL — ABNORMAL HIGH (ref 70–99)
Glucose-Capillary: 161 mg/dL — ABNORMAL HIGH (ref 70–99)
Glucose-Capillary: 134 mg/dL — ABNORMAL HIGH (ref 70–99)

## 2013-12-11 LAB — CBC WITH DIFFERENTIAL/PLATELET
Basophils Absolute: 0 10*3/uL (ref 0.0–0.1)
Basophils Relative: 1 % (ref 0–1)
Eosinophils Absolute: 0 10*3/uL (ref 0.0–0.7)
Eosinophils Relative: 0 % (ref 0–5)
HCT: 28.2 % — ABNORMAL LOW (ref 36.0–46.0)
Hemoglobin: 9.3 g/dL — ABNORMAL LOW (ref 12.0–15.0)
Lymphocytes Relative: 9 % — ABNORMAL LOW (ref 12–46)
Lymphs Abs: 0.3 10*3/uL — ABNORMAL LOW (ref 0.7–4.0)
MCH: 28.4 pg (ref 26.0–34.0)
MCHC: 33 g/dL (ref 30.0–36.0)
MCV: 86 fL (ref 78.0–100.0)
Monocytes Absolute: 0.3 10*3/uL (ref 0.1–1.0)
Monocytes Relative: 8 % (ref 3–12)
Neutro Abs: 3.1 10*3/uL (ref 1.7–7.7)
Neutrophils Relative %: 82 % — ABNORMAL HIGH (ref 43–77)
Platelets: 108 10*3/uL — ABNORMAL LOW (ref 150–400)
RBC: 3.28 MIL/uL — ABNORMAL LOW (ref 3.87–5.11)
RDW: 16.4 % — ABNORMAL HIGH (ref 11.5–15.5)
WBC Morphology: INCREASED
WBC: 3.7 10*3/uL — ABNORMAL LOW (ref 4.0–10.5)

## 2013-12-11 LAB — BASIC METABOLIC PANEL
Anion gap: 10 (ref 5–15)
BUN: 9 mg/dL (ref 6–23)
CO2: 25 mEq/L (ref 19–32)
Calcium: 8.6 mg/dL (ref 8.4–10.5)
Chloride: 102 mEq/L (ref 96–112)
Creatinine, Ser: 0.63 mg/dL (ref 0.50–1.10)
GFR calc Af Amer: >90 mL/min (ref >90)
GFR calc non Af Amer: 81 mL/min — ABNORMAL LOW (ref >90)
Glucose, Bld: 155 mg/dL — ABNORMAL HIGH (ref 70–99)
Potassium: 3.7 mEq/L (ref 3.7–5.3)
Sodium: 137 mEq/L (ref 137–147)

## 2013-12-11 LAB — CULTURE, BLOOD (ROUTINE X 2)

## 2013-12-11 MED ORDER — DILTIAZEM LOAD VIA INFUSION
10.0000 mg | Freq: Once | INTRAVENOUS | Status: AC
  Administered 2013-12-11: 10 mg via INTRAVENOUS
  Filled 2013-12-11: qty 10

## 2013-12-11 MED ORDER — METOPROLOL TARTRATE 1 MG/ML IV SOLN: 5.0000 mg | INTRAVENOUS | Status: DC | PRN

## 2013-12-11 MED ORDER — METOPROLOL TARTRATE 1 MG/ML IV SOLN: 5.0000 mg | Freq: Once | INTRAVENOUS | Status: DC

## 2013-12-11 MED ORDER — DILTIAZEM HCL 100 MG IV SOLR
5.0000 mg/h | INTRAVENOUS | Status: DC
  Administered 2013-12-11: 5 mg/h via INTRAVENOUS
  Filled 2013-12-11: qty 100

## 2013-12-11 NOTE — Progress Notes (Signed)
[facility] PROGRESS NOTE  Whitney [name] IWL:[id number] DOB: 07/09/1927 DOA: 12/08/2013 PCP: Odette Fraction, MD  Assessment/Plan: 1. Neutropenia fever -Patient with history of breast cancer undergoing systemic therapy with cyclophosphamide and doxorubicin -Reported fevers and chills at home -Chest x-ray showed no acute abnormalities, urinalysis normal, nonfocal on physical exam -Blood cultures are positive, one out of two, for Pseudomonas aeruginosa. Organism is pan susceptible. Will continue IV Zosyn for now, stop vancomycin -Will check a 2D echo -Continue supportive care, IV fluid administration, IV antibiotic therapy with Zosyn  2. Sepsis -Present on admission, evidenced by positive blood cultures, HR's in the 130's, neutropenia, functional decline -Blood cultures growing Pseudomonas aeruginosa, organism susceptible to Zosyn -Will check a 2D echo -CXR and U/A unremarkable, nonfocal on exam.  -Repeat blood cultures negative to date  3.  Atrial fibrillation rapid ventricular response, new diagnosis -Patient without significant cardiac history, had reported palpitations over the past week found to be in A. fib with RVR -Continue Cardizem drip, close monitoring in the step down unit -Has history of hypothyroidism, TSH 0.551 -Transthoracic echocardiogram performed on 11/08/2013 which showed EF of 65-70% without wall motion abnormalities, grade 2 diastolic dysfunction -I suspect underlying infectious process and perhaps dehydration may have precipitated A. fib with RVR -Has a CHADVASC score or 5, having a high risk for thromboembolism, risks and benefits of anticoagulation discussed with patient and daughter at bedside -Sarted Eliquis 5 mg PO BID -Patient remains uncontrolled, discussed with cardiology had started a Cardizem drip in addition to amiodarone, may need cardioversion  4. Type 2 diabetes mellitus -Perform Accu-Cheks q. a.c. and each bedtime -On metformin  therapy  5.  Hypertension -Patient with history of hypertension,  -Metoprolol increased to 100 mg daily, restarting IV Cardizem today given hypertension  6.  Hypothyroidism -TSH within normal limits -Continue Synthroid 50 mcg by mouth daily  7. Hypokalemia -Replaced  Code Status: Full Code Family Communication: I spoke to patient's daughters Disposition Plan: Patient from home, continue close monitoring in the step down unit   Antibiotics:  IV vancomycin  IV Zosyn  HPI/Subjective: Patient is a pleasant 80 year old female with a past medical history of stage II breast cancer, admitted to the medicine service on 12/08/2013 presenting with generalized weakness and palpitations. Patient found to be in A. fib with RVR, started on a Cardizem drip and admitted to the step down unit. She also reported having intermittent fevers and chills at home, started on broad-spectrum IV antibiotic therapy with Zosyn and Vancomycin for neutropenic fever. This morning patient states feeling little better, she is awake alert oriented. Family members are present at bedside. She remains in A. Fib.   Objective: Filed Vitals:   12/11/13 1200  BP: 134/80  Pulse: 76  Temp:   Resp: 26    Intake/Output Summary (Last 24 hours) at 12/11/13 1237 Last data filed at 12/11/13 1200  Gross per 24 hour  Intake 1073.5 ml  Output    975 ml  Net   98.5 ml   Filed Weights   12/08/13 2233 12/09/13 0500 12/10/13 0459  Weight: 87 kg (191 lb 12.8 oz) 88 kg (194 lb 0.1 oz) 88.5 kg (195 lb 1.7 oz)    Exam:   General:  Patient is in no acute distress, she is nontoxic appearing awake and alert  Cardiovascular: Irregular rate and rhythm normal S1-S2, tachycardic, no extremity edema  Respiratory: Normal respiratory effort, lungs clear to auscultation bilaterally no wheezing rhonchi or rales  Abdomen: Soft nontender nondistended  Musculoskeletal: No edema  Data Reviewed: Basic Metabolic Panel:  Recent  Labs Lab 12/08/13 1729 12/08/13 1845 12/08/13 2315 12/09/13 0342 12/10/13 0335 12/11/13 0350  NA 136*  --  137 137 138 137  K 3.2*  --  3.3* 3.4* 3.6* 3.7  CL 94*  --  98 99 102 102  CO2 29  --  28 27 26 25   GLUCOSE 176*  --  198* 146* 145* 155*  BUN 14  --  15 12 9 9   CREATININE 0.79  --  0.83 0.73 0.65 0.63  CALCIUM 9.3  --  8.8 8.7 8.6 8.6  MG  --  1.4* 1.4*  --   --   --   PHOS  --   --  2.5  --   --   --    Liver Function Tests:  Recent Labs Lab 12/05/13 1425 12/08/13 1729 12/08/13 2315 12/09/13 0342  AST 9 9 7 8   ALT 6 10 9 8   ALKPHOS 79 57 51 52  BILITOT 0.67 0.5 0.4 0.4  PROT 5.7* 6.0 5.4* 5.5*  ALBUMIN 3.0* 2.9* 2.5* 2.5*   No results found for this basename: LIPASE, AMYLASE,  in the last 168 hours No results found for this basename: AMMONIA,  in the last 168 hours CBC:  Recent Labs Lab 12/05/13 1425 12/08/13 1729 12/08/13 2315 12/09/13 0342 12/10/13 0335 12/11/13 0350  WBC 2.7* 0.3* 0.4* 0.6* 2.1* 3.7*  NEUTROABS 2.4 0.2* 0.1*  --   --  3.1  HGB 10.3* 9.5* 8.8* 9.4* 8.9* 9.3*  HCT 32.7* 27.7* 26.2* 27.7* 26.5* 28.2*  MCV 88.0 84.2 84.8 85.0 85.5 86.0  PLT 138* 106* 91* 104* 90* 108*   Cardiac Enzymes:  Recent Labs Lab 12/08/13 1849  TROPONINI <0.30   BNP (last 3 results) No results found for this basename: PROBNP,  in the last 8760 hours CBG:  Recent Labs Lab 12/10/13 1150 12/10/13 1708 12/10/13 2235 12/11/13 0746 12/11/13 1144  GLUCAP 120* 150* 166* 139* 115*    Recent Results (from the past 240 hour(s))  CULTURE, BLOOD (ROUTINE X 2)     Status: None   Collection Time    12/08/13  6:30 PM      Result Value Ref Range Status   Specimen Description BLOOD LEFT ANTECUBITAL   Final   Special Requests BOTTLES DRAWN AEROBIC AND ANAEROBIC 5ML   Final   Culture  Setup Time     Final   Value: 12/08/2013 22:25     Performed at [facility]   Culture     Final   Value:        BLOOD CULTURE RECEIVED NO GROWTH TO DATE CULTURE  WILL BE HELD FOR 5 DAYS BEFORE ISSUING A FINAL NEGATIVE REPORT     Performed at [facility]   Report Status PENDING   Incomplete  CULTURE, BLOOD (ROUTINE X 2)     Status: None   Collection Time    12/08/13  6:49 PM      Result Value Ref Range Status   Specimen Description BLOOD RIGHT ANTECUBITAL   Final   Special Requests BOTTLES DRAWN AEROBIC AND ANAEROBIC 5ML   Final   Culture  Setup Time     Final   Value: 12/08/2013 22:25     Performed at [facility]   Culture     Final   Value: PSEUDOMONAS AERUGINOSA     Note: Gram Stain Report Called to,Read Back By and Verified  With: Maretta Bees 12/09/13 @ 6:16PM BY RUSCOE A.     Performed at [facility]   Report Status 12/11/2013 FINAL   Final   Organism ID, Bacteria PSEUDOMONAS AERUGINOSA   Final  URINE CULTURE     Status: None   Collection Time    12/08/13  6:58 PM      Result Value Ref Range Status   Specimen Description URINE, CATHETERIZED   Final   Special Requests NONE   Final   Culture  Setup Time     Final   Value: 12/09/2013 00:30     Performed at [place]     Final   Value: NO GROWTH     Performed at [facility]   Culture     Final   Value: NO GROWTH     Performed at [facility]   Report Status 12/10/2013 FINAL   Final  MRSA PCR SCREENING     Status: None   Collection Time    12/08/13 10:55 PM      Result Value Ref Range Status   MRSA by PCR NEGATIVE  NEGATIVE Final   Comment:            The GeneXpert MRSA Assay (FDA     approved for NASAL specimens     only), is one component of a     comprehensive MRSA colonization     surveillance program. It is not     intended to diagnose MRSA     infection nor to guide or     monitor treatment for     MRSA infections.  CULTURE, BLOOD (ROUTINE X 2)     Status: None   Collection Time    12/10/13  7:11 AM      Result Value Ref Range Status   Specimen Description BLOOD LEFT ARM   Final   Special Requests  BOTTLES DRAWN AEROBIC ONLY 3CC   Final   Culture  Setup Time     Final   Value: 12/10/2013 13:29     Performed at [facility]   Culture     Final   Value:        BLOOD CULTURE RECEIVED NO GROWTH TO DATE CULTURE WILL BE HELD FOR 5 DAYS BEFORE ISSUING A FINAL NEGATIVE REPORT     Performed at [facility]   Report Status PENDING   Incomplete  CULTURE, BLOOD (ROUTINE X 2)     Status: None   Collection Time    12/10/13  7:18 AM      Result Value Ref Range Status   Specimen Description BLOOD LEFT ARM   Final   Special Requests BOTTLES DRAWN AEROBIC ONLY 5CC   Final   Culture  Setup Time     Final   Value: 12/10/2013 13:29     Performed at [facility]   Culture     Final   Value:        BLOOD CULTURE RECEIVED NO GROWTH TO DATE CULTURE WILL BE HELD FOR 5 DAYS BEFORE ISSUING A FINAL NEGATIVE REPORT     Performed at [facility]   Report Status PENDING   Incomplete     Studies: No results found.  Scheduled Meds: . apixaban  5 mg Oral BID  . atorvastatin  10 mg Oral Daily  . cholecalciferol  2,000 Units Oral Daily  . ferrous sulfate  325 mg Oral QPC breakfast  .  insulin aspart  0-15 Units Subcutaneous TID WC  . insulin aspart  0-5 Units Subcutaneous QHS  . levothyroxine  50 mcg Oral QAC breakfast  . metFORMIN  500 mg Oral Q breakfast  . metFORMIN  1,000 mg Oral QAC supper  . metoprolol tartrate  100 mg Oral BID  . pioglitazone  30 mg Oral Q breakfast  . piperacillin-tazobactam (ZOSYN)  IV  3.375 g Intravenous 3 times per day  . potassium chloride SA  20 mEq Oral Daily  . sodium chloride  3 mL Intravenous Q12H  . vitamin B-12  1,000 mcg Oral Daily   Continuous Infusions: . amiodarone 30 mg/hr (12/11/13 0700)  . diltiazem (CARDIZEM) infusion 5 mg/hr (12/11/13 1200)    Principal Problem:   Neutropenic fever Active Problems:   Type II or unspecified type diabetes mellitus without mention of complication, not stated as uncontrolled   Essential  hypertension, benign   Pure hypercholesterolemia   Breast cancer of upper-outer quadrant of right female breast   Anemia of chronic disease   Thrombocytopenia   Hypokalemia   Pancytopenia due to antineoplastic chemotherapy   New onset atrial fibrillation   Hypothyroidism   Chronic diastolic CHF (congestive heart failure), NYHA class 2   Atrial fibrillation with RVR   Preoperative cardiovascular examination    Time spent: 55 min    Oaklie Durrett  [facility] Pager [phone]. If 7PM-7AM, please contact night-coverage at [email or web address], password [facility] 12/11/2013, 12:37 PM  LOS: 3 days

## 2013-12-11 NOTE — Progress Notes (Signed)
ANTIBIOTIC CONSULT NOTE - Follow up  Pharmacy Consult for Zosyn Indication: rule out sepsis  No Known Allergies  Patient Measurements: Height: 5\' 7"  (170.2 cm) Weight: 195 lb 1.7 oz (88.5 kg) IBW/kg (Calculated) : 61.6  Vital Signs: Temp: 98.3 F (36.8 C) (09/28 0000) Temp src: Oral (09/28 0000) BP: 174/83 mmHg (09/28 0700) Pulse Rate: 122 (09/28 0700) Intake/Output from previous day: 09/27 0701 - 09/28 0700 In: 1195.2 [P.O.:410; I.V.:685.2; IV Piggyback:100] Out: 925 [Urine:925] Intake/Output from this shift:    Labs:  Recent Labs  12/09/13 0342 12/10/13 0335 12/11/13 0350  WBC 0.6* 2.1* 3.7*  HGB 9.4* 8.9* 9.3*  PLT 104* 90* 108*  CREATININE 0.73 0.65 0.63   Estimated Creatinine Clearance: 60.9 ml/min (by C-G formula based on Cr of 0.63). CrCl ~ 60 ml/min/1.30m2 (normalized)  No results found for this basename: VANCOTROUGH, VANCOPEAK, VANCORANDOM, GENTTROUGH, GENTPEAK, GENTRANDOM, TOBRATROUGH, TOBRAPEAK, TOBRARND, AMIKACINPEAK, AMIKACINTROU, AMIKACIN,  in the last 72 hours   Microbiology: Recent Results (from the past 720 hour(s))  CULTURE, BLOOD (ROUTINE X 2)     Status: None   Collection Time    12/08/13  6:30 PM      Result Value Ref Range Status   Specimen Description BLOOD LEFT ANTECUBITAL   Final   Special Requests BOTTLES DRAWN AEROBIC AND ANAEROBIC 5ML   Final   Culture  Setup Time     Final   Value: 12/08/2013 22:25     Performed at [facility]   Culture     Final   Value:        BLOOD CULTURE RECEIVED NO GROWTH TO DATE CULTURE WILL BE HELD FOR 5 DAYS BEFORE ISSUING A FINAL NEGATIVE REPORT     Performed at [facility]   Report Status PENDING   Incomplete  CULTURE, BLOOD (ROUTINE X 2)     Status: None   Collection Time    12/08/13  6:49 PM      Result Value Ref Range Status   Specimen Description BLOOD RIGHT ANTECUBITAL   Final   Special Requests BOTTLES DRAWN AEROBIC AND ANAEROBIC 5ML   Final   Culture  Setup Time     Final    Value: 12/08/2013 22:25     Performed at [facility]   Culture     Final   Value: PSEUDOMONAS AERUGINOSA     Note: Gram Stain Report Called to,Read Back By and Verified With: VERA WILLIAMS 12/09/13 @ 6:16PM BY RUSCOE A.     Performed at [facility]   Report Status 12/11/2013 FINAL   Final   Organism ID, Bacteria PSEUDOMONAS AERUGINOSA   Final  URINE CULTURE     Status: None   Collection Time    12/08/13  6:58 PM      Result Value Ref Range Status   Specimen Description URINE, CATHETERIZED   Final   Special Requests NONE   Final   Culture  Setup Time     Final   Value: 12/09/2013 00:30     Performed at [facility]     Final   Value: NO GROWTH     Performed at [facility]   Culture     Final   Value: NO GROWTH     Performed at [facility]   Report Status 12/10/2013 FINAL   Final  MRSA PCR SCREENING     Status: None   Collection Time    12/08/13  10:55 PM      Result Value Ref Range Status   MRSA by PCR NEGATIVE  NEGATIVE Final   Comment:            The GeneXpert MRSA Assay (FDA     approved for NASAL specimens     only), is one component of a     comprehensive MRSA colonization     surveillance program. It is not     intended to diagnose MRSA     infection nor to guide or     monitor treatment for     MRSA infections.  CULTURE, BLOOD (ROUTINE X 2)     Status: None   Collection Time    12/10/13  7:11 AM      Result Value Ref Range Status   Specimen Description BLOOD LEFT ARM   Final   Special Requests BOTTLES DRAWN AEROBIC ONLY 3CC   Final   Culture  Setup Time     Final   Value: 12/10/2013 13:29     Performed at [facility]   Culture     Final   Value:        BLOOD CULTURE RECEIVED NO GROWTH TO DATE CULTURE WILL BE HELD FOR 5 DAYS BEFORE ISSUING A FINAL NEGATIVE REPORT     Performed at [facility]   Report Status PENDING   Incomplete  CULTURE, BLOOD (ROUTINE X 2)     Status: None    Collection Time    12/10/13  7:18 AM      Result Value Ref Range Status   Specimen Description BLOOD LEFT ARM   Final   Special Requests BOTTLES DRAWN AEROBIC ONLY 5CC   Final   Culture  Setup Time     Final   Value: 12/10/2013 13:29     Performed at [facility]   Culture     Final   Value:        BLOOD CULTURE RECEIVED NO GROWTH TO DATE CULTURE WILL BE HELD FOR 5 DAYS BEFORE ISSUING A FINAL NEGATIVE REPORT     Performed at [facility]   Report Status PENDING   Incomplete   Assessment: 80 yo female with breast cancer s/p chemo 9/17, presents to ED with weakness and fever. Treating for febrile neutropenia  9/25 >> Vanc >> 9/27 9/25 >> Zosyn >>  Tmax: afebrile since 9/26 WBC: FN resolved. ANC 3.1; Neulasta 9/18 Renal function: SCr wnl, 61CG  9/25 blood: 1/2 pseudomonas, pan sens 9/25 urine: neg 9/27 repeat blood x2: ngtd 9/25 MRSA PCR: neg  Goal of Therapy:  Appropriate antibiotic dosing for renal function; eradication of infection  Plan:   Cont Zosyn 3.375g IV Q8H infused over 4hrs. Duration? Follow up renal function & cultures.  Romeo Rabon, PharmD, pager [phone]. 12/11/2013,8:45 AM.

## 2013-12-11 NOTE — Progress Notes (Signed)
Subjective:  No CP/SOB. Admitted 3 days ago with Neutropenic fever s/p chemo for breast CA   Objective:  Temp:  [98.3 F (36.8 C)-98.8 F (37.1 C)] 98.3 F (36.8 C) (09/28 0000) Pulse Rate:  [36-129] 122 (09/28 0700) Resp:  [18-29] 26 (09/28 0700) BP: (114-174)/(63-100) 174/83 mmHg (09/28 0700) SpO2:  [91 %-97 %] 97 % (09/28 0700) Weight change:   Intake/Output from previous day: 09/27 0701 - 09/28 0700 In: 1195.2 [P.O.:410; I.V.:685.2; IV Piggyback:100] Out: 925 [Urine:925]  Intake/Output from this shift:    Physical Exam: General appearance: alert and no distress Neck: no adenopathy, no carotid bruit, no JVD, supple, symmetrical, trachea midline and thyroid not enlarged, symmetric, no tenderness/mass/nodules Lungs: clear to auscultation bilaterally Heart: irregularly irregular rhythm and rapid venticular response Extremities: extremities normal, atraumatic, no cyanosis or edema  Lab Results: Results for orders placed during the hospital encounter of 12/08/13 (from the past 48 hour(s))  GLUCOSE, CAPILLARY     Status: Abnormal   Collection Time    12/09/13  1:11 PM      Result Value Ref Range   Glucose-Capillary 135 (*) 70 - 99 mg/dL   Comment 1 Documented in Chart     Comment 2 Notify RN    GLUCOSE, CAPILLARY     Status: Abnormal   Collection Time    12/09/13  4:18 PM      Result Value Ref Range   Glucose-Capillary 106 (*) 70 - 99 mg/dL   Comment 1 Documented in Chart     Comment 2 Notify RN    GLUCOSE, CAPILLARY     Status: Abnormal   Collection Time    12/09/13 10:57 PM      Result Value Ref Range   Glucose-Capillary 127 (*) 70 - 99 mg/dL  CBC     Status: Abnormal   Collection Time    12/10/13  3:35 AM      Result Value Ref Range   WBC 2.1 (*) 4.0 - 10.5 K/uL   RBC 3.10 (*) 3.87 - 5.11 MIL/uL   Hemoglobin 8.9 (*) 12.0 - 15.0 g/dL   HCT 26.5 (*) 36.0 - 46.0 %   MCV 85.5  78.0 - 100.0 fL   MCH 28.7  26.0 - 34.0 pg   MCHC 33.6  30.0 - 36.0 g/dL   RDW 16.2 (*) 11.5 - 15.5 %   Platelets 90 (*) 150 - 400 K/uL   Comment: CONSISTENT WITH PREVIOUS RESULT     REPEATED TO VERIFY     SPECIMEN CHECKED FOR CLOTS  BASIC METABOLIC PANEL     Status: Abnormal   Collection Time    12/10/13  3:35 AM      Result Value Ref Range   Sodium 138  137 - 147 mEq/L   Potassium 3.6 (*) 3.7 - 5.3 mEq/L   Chloride 102  96 - 112 mEq/L   CO2 26  19 - 32 mEq/L   Glucose, Bld 145 (*) 70 - 99 mg/dL   BUN 9  6 - 23 mg/dL   Creatinine, Ser 0.65  0.50 - 1.10 mg/dL   Calcium 8.6  8.4 - 10.5 mg/dL   GFR calc non Af Amer 80 (*) >90 mL/min   GFR calc Af Amer >90  >90 mL/min   Comment: (NOTE)     The eGFR has been calculated using the CKD EPI equation.     This calculation has not been validated in all clinical situations.  eGFR's persistently <90 mL/min signify possible Chronic Kidney     Disease.   Anion gap 10  5 - 15  GLUCOSE, CAPILLARY     Status: Abnormal   Collection Time    12/10/13  7:51 AM      Result Value Ref Range   Glucose-Capillary 133 (*) 70 - 99 mg/dL   Comment 1 Documented in Chart     Comment 2 Notify RN    GLUCOSE, CAPILLARY     Status: Abnormal   Collection Time    12/10/13 11:50 AM      Result Value Ref Range   Glucose-Capillary 120 (*) 70 - 99 mg/dL   Comment 1 Documented in Chart     Comment 2 Notify RN    GLUCOSE, CAPILLARY     Status: Abnormal   Collection Time    12/10/13  5:08 PM      Result Value Ref Range   Glucose-Capillary 150 (*) 70 - 99 mg/dL   Comment 1 Documented in Chart     Comment 2 Notify RN    GLUCOSE, CAPILLARY     Status: Abnormal   Collection Time    12/10/13 10:35 PM      Result Value Ref Range   Glucose-Capillary 166 (*) 70 - 99 mg/dL  BASIC METABOLIC PANEL     Status: Abnormal   Collection Time    12/11/13  3:50 AM      Result Value Ref Range   Sodium 137  137 - 147 mEq/L   Potassium 3.7  3.7 - 5.3 mEq/L   Chloride 102  96 - 112 mEq/L   CO2 25  19 - 32 mEq/L   Glucose, Bld 155 (*) 70 - 99  mg/dL   BUN 9  6 - 23 mg/dL   Creatinine, Ser 0.63  0.50 - 1.10 mg/dL   Calcium 8.6  8.4 - 10.5 mg/dL   GFR calc non Af Amer 81 (*) >90 mL/min   GFR calc Af Amer >90  >90 mL/min   Comment: (NOTE)     The eGFR has been calculated using the CKD EPI equation.     This calculation has not been validated in all clinical situations.     eGFR's persistently <90 mL/min signify possible Chronic Kidney     Disease.   Anion gap 10  5 - 15  CBC WITH DIFFERENTIAL     Status: Abnormal   Collection Time    12/11/13  3:50 AM      Result Value Ref Range   WBC 3.7 (*) 4.0 - 10.5 K/uL   RBC 3.28 (*) 3.87 - 5.11 MIL/uL   Hemoglobin 9.3 (*) 12.0 - 15.0 g/dL   HCT 28.2 (*) 36.0 - 46.0 %   MCV 86.0  78.0 - 100.0 fL   MCH 28.4  26.0 - 34.0 pg   MCHC 33.0  30.0 - 36.0 g/dL   RDW 16.4 (*) 11.5 - 15.5 %   Platelets 108 (*) 150 - 400 K/uL   Comment: CONSISTENT WITH PREVIOUS RESULT   Neutrophils Relative % 82 (*) 43 - 77 %   Lymphocytes Relative 9 (*) 12 - 46 %   Monocytes Relative 8  3 - 12 %   Eosinophils Relative 0  0 - 5 %   Basophils Relative 1  0 - 1 %   Neutro Abs 3.1  1.7 - 7.7 K/uL   Lymphs Abs 0.3 (*) 0.7 - 4.0 K/uL   Monocytes Absolute  0.3  0.1 - 1.0 K/uL   Eosinophils Absolute 0.0  0.0 - 0.7 K/uL   Basophils Absolute 0.0  0.0 - 0.1 K/uL   RBC Morphology POLYCHROMASIA PRESENT     WBC Morphology INCREASED BANDS (>20% BANDS)     Comment: MILD LEFT SHIFT (1-5% METAS, OCC MYELO, OCC BANDS)   Smear Review LARGE PLATELETS PRESENT     Comment: GIANT PLATELETS SEEN    Imaging: Imaging results have been reviewed  Tele: AFIB with RVR  Assessment/Plan:   1. Principal Problem: 2.   Neutropenic fever 3. Active Problems: 4.   Type II or unspecified type diabetes mellitus without mention of complication, not stated as uncontrolled 5.   Essential hypertension, benign 6.   Pure hypercholesterolemia 7.   Breast cancer of upper-outer quadrant of right female breast 8.   Anemia of chronic  disease 9.   Thrombocytopenia 10.   Hypokalemia 11.   Pancytopenia due to antineoplastic chemotherapy 12.   New onset atrial fibrillation 13.   Hypothyroidism 14.   Chronic diastolic CHF (congestive heart failure), NYHA class 2 15.   Atrial fibrillation with RVR 16.   Preoperative cardiovascular examination 17.   Time Spent Directly with Patient:  20 minutes  Length of Stay:  LOS: 3 days   We were asked to see pt with new onset AFIB with RVR in setting of neutropenic FUO. On broad spectrum ATBX. Nl LV FXN by 2D last month with LA E and diastolic dysfunction. H/O HTN on ACE-I, diuretic and BB as OP. She became hypotensive on moderately high dose dilt infusion which was D/Cd and changed to Amio. BP is elevated this AM as is VR. She was started on Eliquis [facility] appropriately because of elevated CHA2DS2VASC score of 5. Can add back low dose iv dilt. Hopefully she will convert on her own on meds and with Rx of ? Sepsis. BC + for GNR. WBCs are improving. Agree that she appears euvolemic at this point. She may require DCCV.    Lorretta Harp 12/11/2013, 8:21 AM

## 2013-12-11 NOTE — Progress Notes (Signed)
Per Dr Lindi Adie, Cancel chemo this week - pt in hospital.  Appointments cancelled.   Contacted daughter Clyda Greener and let her know all appts cancelled for this week.

## 2013-12-12 DIAGNOSIS — I369 Nonrheumatic tricuspid valve disorder, unspecified: Secondary | ICD-10-CM

## 2013-12-12 DIAGNOSIS — A4152 Sepsis due to Pseudomonas: Secondary | ICD-10-CM

## 2013-12-12 LAB — CBC WITH DIFFERENTIAL/PLATELET
Basophils Absolute: 0 10*3/uL (ref 0.0–0.1)
Basophils Relative: 1 % (ref 0–1)
Eosinophils Absolute: 0 10*3/uL (ref 0.0–0.7)
Eosinophils Relative: 0 % (ref 0–5)
HCT: 26.9 % — ABNORMAL LOW (ref 36.0–46.0)
Hemoglobin: 8.8 g/dL — ABNORMAL LOW (ref 12.0–15.0)
Lymphocytes Relative: 11 % — ABNORMAL LOW (ref 12–46)
Lymphs Abs: 0.5 10*3/uL — ABNORMAL LOW (ref 0.7–4.0)
MCH: 28.2 pg (ref 26.0–34.0)
MCHC: 32.7 g/dL (ref 30.0–36.0)
MCV: 86.2 fL (ref 78.0–100.0)
Monocytes Absolute: 0.3 10*3/uL (ref 0.1–1.0)
Monocytes Relative: 7 % (ref 3–12)
Neutro Abs: 3.7 10*3/uL (ref 1.7–7.7)
Neutrophils Relative %: 81 % — ABNORMAL HIGH (ref 43–77)
Platelets: 134 10*3/uL — ABNORMAL LOW (ref 150–400)
RBC: 3.12 MIL/uL — ABNORMAL LOW (ref 3.87–5.11)
RDW: 16.6 % — ABNORMAL HIGH (ref 11.5–15.5)
WBC: 4.5 10*3/uL (ref 4.0–10.5)

## 2013-12-12 LAB — GLUCOSE, CAPILLARY
Glucose-Capillary: 200 mg/dL — ABNORMAL HIGH (ref 70–99)
Glucose-Capillary: 116 mg/dL — ABNORMAL HIGH (ref 70–99)
Glucose-Capillary: 163 mg/dL — ABNORMAL HIGH (ref 70–99)

## 2013-12-12 MED ORDER — AMIODARONE HCL 200 MG PO TABS
400.0000 mg | ORAL_TABLET | Freq: Two times a day (BID) | ORAL | Status: DC
  Administered 2013-12-12 – 2013-12-14 (×5): 400 mg via ORAL
  Filled 2013-12-12 (×5): qty 2

## 2013-12-12 NOTE — Progress Notes (Signed)
[facility] PROGRESS NOTE  Whitney [name] WIO:[id number] DOB: 06/24/1927 DOA: 12/08/2013 PCP: Odette Fraction, MD  Assessment/Plan: 1. Neutropenia fever -Patient with history of breast cancer undergoing systemic therapy with cyclophosphamide and doxorubicin -Reported fevers and chills at home -Chest x-ray showed no acute abnormalities, urinalysis normal, nonfocal on physical exam -Blood cultures are positive, one out of two, for Pseudomonas aeruginosa. Organism is pan susceptible. Will continue IV Zosyn for now, I stopped vancomycin on 12/11/2013 -Repeat 2D echo did not show significant change from previous echo -Continue supportive care, IV fluid administration, IV antibiotic therapy with Zosyn 2. Sepsis -Present on admission, evidenced by positive blood cultures, HR's in the 130's, neutropenia, functional decline -Blood cultures growing Pseudomonas aeruginosa, organism susceptible to Zosyn -CXR and U/A unremarkable, nonfocal on exam.  -Repeat blood cultures on 12/10/2013 negative to date, will follow  3.  Atrial fibrillation rapid ventricular response, new diagnosis -Patient without significant cardiac history, had reported palpitations over the past week found to be in A. fib with RVR -Continue Cardizem drip, close monitoring in the step down unit -Has history of hypothyroidism, TSH 0.551 -Transthoracic echocardiogram performed on 11/08/2013 which showed EF of 65-70% without wall motion abnormalities, grade 2 diastolic dysfunction -I suspect underlying infectious process and perhaps dehydration may have precipitated A. fib with RVR -Has a CHADVASC score or 5, having a high risk for thromboembolism, risks and benefits of anticoagulation discussed with patient and daughter at bedside -Sarted Eliquis 5 mg PO BID -Patient converting to sinus rhythm, I discussed case with cardiology, will discontinue IV Amio and IV Cardizem, start Amio 400 mg Po BID, continue Metoprolol 100 mg PO  BID  4. Type 2 diabetes mellitus -Perform Accu-Cheks q. a.c. and each bedtime -On metformin therapy  5.  Hypertension -Patient with history of hypertension,  -Metoprolol increased to 100 mg daily, restarting IV Cardizem today given hypertension  6.  Hypothyroidism -TSH within normal limits -Continue Synthroid 50 mcg by mouth daily  7. Hypokalemia -Replaced  Code Status: Full Code Family Communication: I spoke to patient's daughters Disposition Plan: Patient from home, continue close monitoring in the step down unit   Antibiotics:  IV vancomycin  IV Zosyn  HPI/Subjective: Patient is a pleasant 80 year old female with a past medical history of stage II breast cancer, admitted to the medicine service on 12/08/2013 presenting with generalized weakness and palpitations. Patient found to be in A. fib with RVR, started on a Cardizem drip and admitted to the step down unit. She also reported having intermittent fevers and chills at home, started on broad-spectrum IV antibiotic therapy with Zosyn and Vancomycin for neutropenic fever. This morning patient states feeling little better, she is awake alert oriented. Family members are present at bedside. She remains in A. Fib.   Objective: Filed Vitals:   12/12/13 0800  BP: 177/85  Pulse: 91  Temp: 98.1 F (36.7 C)  Resp: 23    Intake/Output Summary (Last 24 hours) at 12/12/13 1104 Last data filed at 12/12/13 1100  Gross per 24 hour  Intake 1488.3 ml  Output    775 ml  Net  713.3 ml   Filed Weights   12/09/13 0500 12/10/13 0459 12/12/13 0200  Weight: 88 kg (194 lb 0.1 oz) 88.5 kg (195 lb 1.7 oz) 89.3 kg (196 lb 13.9 oz)    Exam:   General:  Patient is in no acute distress, she is nontoxic appearing awake and alert  Cardiovascular: Irregular rate and rhythm normal S1-S2, tachycardic, no extremity edema  Respiratory: Normal respiratory effort, lungs clear to auscultation bilaterally no wheezing rhonchi or  rales  Abdomen: Soft nontender nondistended  Musculoskeletal: No edema  Data Reviewed: Basic Metabolic Panel:  Recent Labs Lab 12/08/13 1729 12/08/13 1845 12/08/13 2315 12/09/13 0342 12/10/13 0335 12/11/13 0350  NA 136*  --  137 137 138 137  K 3.2*  --  3.3* 3.4* 3.6* 3.7  CL 94*  --  98 99 102 102  CO2 29  --  28 27 26 25   GLUCOSE 176*  --  198* 146* 145* 155*  BUN 14  --  15 12 9 9   CREATININE 0.79  --  0.83 0.73 0.65 0.63  CALCIUM 9.3  --  8.8 8.7 8.6 8.6  MG  --  1.4* 1.4*  --   --   --   PHOS  --   --  2.5  --   --   --    Liver Function Tests:  Recent Labs Lab 12/05/13 1425 12/08/13 1729 12/08/13 2315 12/09/13 0342  AST 9 9 7 8   ALT 6 10 9 8   ALKPHOS 79 57 51 52  BILITOT 0.67 0.5 0.4 0.4  PROT 5.7* 6.0 5.4* 5.5*  ALBUMIN 3.0* 2.9* 2.5* 2.5*   No results found for this basename: LIPASE, AMYLASE,  in the last 168 hours No results found for this basename: AMMONIA,  in the last 168 hours CBC:  Recent Labs Lab 12/05/13 1425  12/08/13 1729 12/08/13 2315 12/09/13 0342 12/10/13 0335 12/11/13 0350 12/12/13 0350  WBC 2.7*  < > 0.3* 0.4* 0.6* 2.1* 3.7* 4.5  NEUTROABS 2.4  --  0.2* 0.1*  --   --  3.1 3.7  HGB 10.3*  < > 9.5* 8.8* 9.4* 8.9* 9.3* 8.8*  HCT 32.7*  < > 27.7* 26.2* 27.7* 26.5* 28.2* 26.9*  MCV 88.0  < > 84.2 84.8 85.0 85.5 86.0 86.2  PLT 138*  < > 106* 91* 104* 90* 108* 134*  < > = values in this interval not displayed. Cardiac Enzymes:  Recent Labs Lab 12/08/13 1849  TROPONINI <0.30   BNP (last 3 results) No results found for this basename: PROBNP,  in the last 8760 hours CBG:  Recent Labs Lab 12/11/13 0746 12/11/13 1144 12/11/13 1706 12/11/13 2212 12/12/13 0812  GLUCAP 139* 115* 161* 134* 200*    Recent Results (from the past 240 hour(s))  CULTURE, BLOOD (ROUTINE X 2)     Status: None   Collection Time    12/08/13  6:30 PM      Result Value Ref Range Status   Specimen Description BLOOD LEFT ANTECUBITAL   Final   Special  Requests BOTTLES DRAWN AEROBIC AND ANAEROBIC 5ML   Final   Culture  Setup Time     Final   Value: 12/08/2013 22:25     Performed at [facility]   Culture     Final   Value:        BLOOD CULTURE RECEIVED NO GROWTH TO DATE CULTURE WILL BE HELD FOR 5 DAYS BEFORE ISSUING A FINAL NEGATIVE REPORT     Performed at [facility]   Report Status PENDING   Incomplete  CULTURE, BLOOD (ROUTINE X 2)     Status: None   Collection Time    12/08/13  6:49 PM      Result Value Ref Range Status   Specimen Description BLOOD RIGHT ANTECUBITAL   Final   Special Requests BOTTLES DRAWN AEROBIC AND ANAEROBIC 5ML  Final   Culture  Setup Time     Final   Value: 12/08/2013 22:25     Performed at [facility]   Culture     Final   Value: PSEUDOMONAS AERUGINOSA     Note: Gram Stain Report Called to,Read Back By and Verified With: VERA WILLIAMS 12/09/13 @ 6:16PM BY RUSCOE A.     Performed at [facility]   Report Status 12/11/2013 FINAL   Final   Organism ID, Bacteria PSEUDOMONAS AERUGINOSA   Final  URINE CULTURE     Status: None   Collection Time    12/08/13  6:58 PM      Result Value Ref Range Status   Specimen Description URINE, CATHETERIZED   Final   Special Requests NONE   Final   Culture  Setup Time     Final   Value: 12/09/2013 00:30     Performed at [place]     Final   Value: NO GROWTH     Performed at [facility]   Culture     Final   Value: NO GROWTH     Performed at [facility]   Report Status 12/10/2013 FINAL   Final  MRSA PCR SCREENING     Status: None   Collection Time    12/08/13 10:55 PM      Result Value Ref Range Status   MRSA by PCR NEGATIVE  NEGATIVE Final   Comment:            The GeneXpert MRSA Assay (FDA     approved for NASAL specimens     only), is one component of a     comprehensive MRSA colonization     surveillance program. It is not     intended to diagnose MRSA     infection nor to guide  or     monitor treatment for     MRSA infections.  CULTURE, BLOOD (ROUTINE X 2)     Status: None   Collection Time    12/10/13  7:11 AM      Result Value Ref Range Status   Specimen Description BLOOD LEFT ARM   Final   Special Requests BOTTLES DRAWN AEROBIC ONLY 3CC   Final   Culture  Setup Time     Final   Value: 12/10/2013 13:29     Performed at [facility]   Culture     Final   Value:        BLOOD CULTURE RECEIVED NO GROWTH TO DATE CULTURE WILL BE HELD FOR 5 DAYS BEFORE ISSUING A FINAL NEGATIVE REPORT     Performed at [facility]   Report Status PENDING   Incomplete  CULTURE, BLOOD (ROUTINE X 2)     Status: None   Collection Time    12/10/13  7:18 AM      Result Value Ref Range Status   Specimen Description BLOOD LEFT ARM   Final   Special Requests BOTTLES DRAWN AEROBIC ONLY 5CC   Final   Culture  Setup Time     Final   Value: 12/10/2013 13:29     Performed at [facility]   Culture     Final   Value:        BLOOD CULTURE RECEIVED NO GROWTH TO DATE CULTURE WILL BE HELD FOR 5 DAYS BEFORE ISSUING A FINAL NEGATIVE REPORT     Performed at [facility]   Report Status PENDING   Incomplete     Studies: No results found.  Scheduled Meds: . amiodarone  400 mg Oral BID  . apixaban  5 mg Oral BID  . atorvastatin  10 mg Oral Daily  . cholecalciferol  2,000 Units Oral Daily  . ferrous sulfate  325 mg Oral QPC breakfast  . insulin aspart  0-15 Units Subcutaneous TID WC  . insulin aspart  0-5 Units Subcutaneous QHS  . levothyroxine  50 mcg Oral QAC breakfast  . metFORMIN  500 mg Oral Q breakfast  . metFORMIN  1,000 mg Oral QAC supper  . metoprolol tartrate  100 mg Oral BID  . pioglitazone  30 mg Oral Q breakfast  . piperacillin-tazobactam (ZOSYN)  IV  3.375 g Intravenous 3 times per day  . potassium chloride SA  20 mEq Oral Daily  . sodium chloride  3 mL Intravenous Q12H  . vitamin B-12  1,000 mcg Oral Daily   Continuous Infusions:     Principal Problem:   Neutropenic fever Active Problems:   Type II or unspecified type diabetes mellitus without mention of complication, not stated as uncontrolled   Essential hypertension, benign   Pure hypercholesterolemia   Breast cancer of upper-outer quadrant of right female breast   Anemia of chronic disease   Thrombocytopenia   Hypokalemia   Pancytopenia due to antineoplastic chemotherapy   New onset atrial fibrillation   Hypothyroidism   Chronic diastolic CHF (congestive heart failure), NYHA class 2   Atrial fibrillation with RVR   Preoperative cardiovascular examination    Time spent: 30 min    Kelvin Cellar  [facility] Pager [phone]. If 7PM-7AM, please contact night-coverage at [email or web address], password [facility] 12/12/2013, 11:04 AM  LOS: 4 days

## 2013-12-12 NOTE — Care Management Note (Addendum)
    Page 1 of 2   12/14/2013     11:55:12 AM CARE MANAGEMENT NOTE 12/14/2013  Patient:  Whitney [name], Whitney [name]   Account Number:  [id number]  Date Initiated:  12/12/2013  Documentation initiated by:  DAVIS,RHONDA  Subjective/Objective Assessment:   Neutropenia fever  sepsis  a.fib w/rvr     Action/Plan:   home when stable/lives with adult children   Anticipated DC Date:  12/14/2013   Anticipated DC Plan:  [place] referral  NA      DC Planning Services  CM consult      [facility] Choice  NA   Choice offered to / List presented to:  C-1 Patient      DME agency  NA     [place] arranged  HH-2 PT      [place].   Status of service:  Completed, signed off Medicare Important Message given?  YES (If response is "NO", the following Medicare IM given date fields will be blank) Date Medicare IM given:  12/13/2013 Medicare IM given by:  [facility] Date Additional Medicare IM given:   Additional Medicare IM given by:    Discharge Disposition:  [place]  Per UR Regulation:  Reviewed for med. necessity/level of care/duration of stay  If discussed at [place] of Stay Meetings, dates discussed:   12/14/2013    Comments:  12/14/13 Jeffifer Rabold RN,BSN NCM [phone] D/C HOME [place].[facility] FREE TRIAL OFFER COUPON GIVEN.NO FURTHER D/C NEEDS.  12/13/13 Kami Kube RN,BSN NCM [phone] 3:30P-[facility] CHOSEN FOR HHPT,RECEIVED ORDERS.[facility] REP [place] D/C IN AM. TRANSFER FROM SDU.PT ORDERED.AWAIT RECOMMENDATIONS.  [id number] Rosana Hoes, RN, BSN, CCM Discharge needs and chart reviewed. Discharge needs and patient's stay to be followed by case manager.

## 2013-12-12 NOTE — Progress Notes (Signed)
SUBJECTIVE: patient is in sinus rhythm, Pseudomonas in the blood being treated with Zosyn, afebrile, no longer neutropenic  OBJECTIVE PHYSICAL EXAMINATION: ECOG PERFORMANCE STATUS: 3 - Symptomatic, >50% confined to bed  Filed Vitals:   12/12/13 0800  BP: 177/85  Pulse: 91  Temp: 98.1 F (36.7 C)  Resp: 23   Filed Weights   12/09/13 0500 12/10/13 0459 12/12/13 0200  Weight: 194 lb 0.1 oz (88 kg) 195 lb 1.7 oz (88.5 kg) 196 lb 13.9 oz (89.3 kg)    GENERAL:alert, no distress and comfortable SKIN: skin color, texture, turgor are normal, no rashes or significant lesions EYES: normal, Conjunctiva are pink and non-injected, sclera clear OROPHARYNX:no exudate, no erythema and lips, buccal mucosa, and tongue normal  NECK: supple, thyroid normal size, non-tender, without nodularity LYMPH:  no palpable lymphadenopathy in the cervical, axillary or inguinal LUNGS: clear to auscultation and percussion with normal breathing effort HEART: regular rate & rhythm and no murmurs and no lower extremity edema ABDOMEN:abdomen soft, non-tender and normal bowel sounds Musculoskeletal:no cyanosis of digits and no clubbing  NEURO: alert & oriented x 3 with fluent speech, no focal motor/sensory deficits  LABORATORY DATA:  I have reviewed the data as listed CMP     Component Value Date/Time   NA 137 12/11/2013 0350   NA 140 12/05/2013 1425   K 3.7 12/11/2013 0350   K 3.6 12/05/2013 1425   CL 102 12/11/2013 0350   CO2 25 12/11/2013 0350   CO2 32* 12/05/2013 1425   GLUCOSE 155* 12/11/2013 0350   GLUCOSE 159* 12/05/2013 1425   BUN 9 12/11/2013 0350   BUN 31.6* 12/05/2013 1425   CREATININE 0.63 12/11/2013 0350   CREATININE 1.0 12/05/2013 1425   CREATININE 0.88 07/27/2013 0913   CALCIUM 8.6 12/11/2013 0350   CALCIUM 9.3 12/05/2013 1425   PROT 5.5* 12/09/2013 0342   PROT 5.7* 12/05/2013 1425   ALBUMIN 2.5* 12/09/2013 0342   ALBUMIN 3.0* 12/05/2013 1425   AST 8 12/09/2013 0342   AST 9 12/05/2013 1425   ALT 8  12/09/2013 0342   ALT 6 12/05/2013 1425   ALKPHOS 52 12/09/2013 0342   ALKPHOS 79 12/05/2013 1425   BILITOT 0.4 12/09/2013 0342   BILITOT 0.67 12/05/2013 1425   GFRNONAA 81* 12/11/2013 0350   GFRNONAA 61 07/27/2013 0913   GFRAA >90 12/11/2013 0350   GFRAA 70 07/27/2013 0913    Lab Results  Component Value Date   WBC 4.5 12/12/2013   HGB 8.8* 12/12/2013   HCT 26.9* 12/12/2013   MCV 86.2 12/12/2013   PLT 134* 12/12/2013   NEUTROABS 3.7 12/12/2013    ASSESSMENT AND PLAN:  1. atrial fibrillation: Now in normal sinus rhythm. Appreciate cardiology's help in managing her A.fib 2. sepsis with Pseudomonas: Blood cultures from 9/25 revealed pseudomonas aeruginosa. Currently on Zosyn. Patient is no longer neutropenic and she has had a full account recovery. 3. anemia due to recent infection and chemotherapy We will cancel her chemotherapy but this Thursday until she is done with the antibiotic therapy. I will follow her up in my clinic upon discharge from the hospital.

## 2013-12-12 NOTE — Progress Notes (Signed)
Subjective:  No CP/SOB  Objective:  Temp:  [98.2 F (36.8 C)-99.2 F (37.3 C)] 98.9 F (37.2 C) (09/29 0400) Pulse Rate:  [63-148] 88 (09/29 0700) Resp:  [15-29] 21 (09/29 0700) BP: (115-164)/(55-120) 143/83 mmHg (09/29 0700) SpO2:  [88 %-100 %] 88 % (09/29 0700) Weight:  [196 lb 13.9 oz (89.3 kg)] 196 lb 13.9 oz (89.3 kg) (09/29 0200) Weight change:   Intake/Output from previous day: 09/28 0701 - 09/29 0700 In: 1419.6 [P.O.:630; I.V.:519.6; IV Piggyback:150] Out: 825 [Urine:825]  Intake/Output from this shift:    Physical Exam: General appearance: alert and no distress Neck: no adenopathy, no carotid bruit, no JVD, supple, symmetrical, trachea midline and thyroid not enlarged, symmetric, no tenderness/mass/nodules Lungs: clear to auscultation bilaterally Heart: regular rate and rhythm, S1, S2 normal, no murmur, click, rub or gallop Extremities: extremities normal, atraumatic, no cyanosis or edema  Lab Results: Results for orders placed during the hospital encounter of 12/08/13 (from the past 48 hour(s))  GLUCOSE, CAPILLARY     Status: Abnormal   Collection Time    12/10/13  7:51 AM      Result Value Ref Range   Glucose-Capillary 133 (*) 70 - 99 mg/dL   Comment 1 Documented in Chart     Comment 2 Notify RN    GLUCOSE, CAPILLARY     Status: Abnormal   Collection Time    12/10/13 11:50 AM      Result Value Ref Range   Glucose-Capillary 120 (*) 70 - 99 mg/dL   Comment 1 Documented in Chart     Comment 2 Notify RN    GLUCOSE, CAPILLARY     Status: Abnormal   Collection Time    12/10/13  5:08 PM      Result Value Ref Range   Glucose-Capillary 150 (*) 70 - 99 mg/dL   Comment 1 Documented in Chart     Comment 2 Notify RN    GLUCOSE, CAPILLARY     Status: Abnormal   Collection Time    12/10/13 10:35 PM      Result Value Ref Range   Glucose-Capillary 166 (*) 70 - 99 mg/dL  BASIC METABOLIC PANEL     Status: Abnormal   Collection Time    12/11/13  3:50 AM     Result Value Ref Range   Sodium 137  137 - 147 mEq/L   Potassium 3.7  3.7 - 5.3 mEq/L   Chloride 102  96 - 112 mEq/L   CO2 25  19 - 32 mEq/L   Glucose, Bld 155 (*) 70 - 99 mg/dL   BUN 9  6 - 23 mg/dL   Creatinine, Ser 0.63  0.50 - 1.10 mg/dL   Calcium 8.6  8.4 - 10.5 mg/dL   GFR calc non Af Amer 81 (*) >90 mL/min   GFR calc Af Amer >90  >90 mL/min   Comment: (NOTE)     The eGFR has been calculated using the CKD EPI equation.     This calculation has not been validated in all clinical situations.     eGFR's persistently <90 mL/min signify possible Chronic Kidney     Disease.   Anion gap 10  5 - 15  CBC WITH DIFFERENTIAL     Status: Abnormal   Collection Time    12/11/13  3:50 AM      Result Value Ref Range   WBC 3.7 (*) 4.0 - 10.5 K/uL   RBC 3.28 (*) 3.87 -  5.11 MIL/uL   Hemoglobin 9.3 (*) 12.0 - 15.0 g/dL   HCT 28.2 (*) 36.0 - 46.0 %   MCV 86.0  78.0 - 100.0 fL   MCH 28.4  26.0 - 34.0 pg   MCHC 33.0  30.0 - 36.0 g/dL   RDW 16.4 (*) 11.5 - 15.5 %   Platelets 108 (*) 150 - 400 K/uL   Comment: CONSISTENT WITH PREVIOUS RESULT   Neutrophils Relative % 82 (*) 43 - 77 %   Lymphocytes Relative 9 (*) 12 - 46 %   Monocytes Relative 8  3 - 12 %   Eosinophils Relative 0  0 - 5 %   Basophils Relative 1  0 - 1 %   Neutro Abs 3.1  1.7 - 7.7 K/uL   Lymphs Abs 0.3 (*) 0.7 - 4.0 K/uL   Monocytes Absolute 0.3  0.1 - 1.0 K/uL   Eosinophils Absolute 0.0  0.0 - 0.7 K/uL   Basophils Absolute 0.0  0.0 - 0.1 K/uL   RBC Morphology POLYCHROMASIA PRESENT     WBC Morphology INCREASED BANDS (>20% BANDS)     Comment: MILD LEFT SHIFT (1-5% METAS, OCC MYELO, OCC BANDS)   Smear Review LARGE PLATELETS PRESENT     Comment: GIANT PLATELETS SEEN  GLUCOSE, CAPILLARY     Status: Abnormal   Collection Time    12/11/13  7:46 AM      Result Value Ref Range   Glucose-Capillary 139 (*) 70 - 99 mg/dL   Comment 1 Documented in Chart     Comment 2 Notify RN    GLUCOSE, CAPILLARY     Status: Abnormal    Collection Time    12/11/13 11:44 AM      Result Value Ref Range   Glucose-Capillary 115 (*) 70 - 99 mg/dL   Comment 1 Documented in Chart     Comment 2 Notify RN    GLUCOSE, CAPILLARY     Status: Abnormal   Collection Time    12/11/13  5:06 PM      Result Value Ref Range   Glucose-Capillary 161 (*) 70 - 99 mg/dL  GLUCOSE, CAPILLARY     Status: Abnormal   Collection Time    12/11/13 10:12 PM      Result Value Ref Range   Glucose-Capillary 134 (*) 70 - 99 mg/dL   Comment 1 Documented in Chart     Comment 2 Notify RN    CBC WITH DIFFERENTIAL     Status: Abnormal   Collection Time    12/12/13  3:50 AM      Result Value Ref Range   WBC 4.5  4.0 - 10.5 K/uL   RBC 3.12 (*) 3.87 - 5.11 MIL/uL   Hemoglobin 8.8 (*) 12.0 - 15.0 g/dL   HCT 26.9 (*) 36.0 - 46.0 %   MCV 86.2  78.0 - 100.0 fL   MCH 28.2  26.0 - 34.0 pg   MCHC 32.7  30.0 - 36.0 g/dL   RDW 16.6 (*) 11.5 - 15.5 %   Platelets 134 (*) 150 - 400 K/uL   Neutrophils Relative % 81 (*) 43 - 77 %   Lymphocytes Relative 11 (*) 12 - 46 %   Monocytes Relative 7  3 - 12 %   Eosinophils Relative 0  0 - 5 %   Basophils Relative 1  0 - 1 %   Neutro Abs 3.7  1.7 - 7.7 K/uL   Lymphs Abs 0.5 (*) 0.7 -  4.0 K/uL   Monocytes Absolute 0.3  0.1 - 1.0 K/uL   Eosinophils Absolute 0.0  0.0 - 0.7 K/uL   Basophils Absolute 0.0  0.0 - 0.1 K/uL   RBC Morphology POLYCHROMASIA PRESENT     Comment: TARGET CELLS     ELLIPTOCYTES   WBC Morphology VACUOLATED NEUTROPHILS     Comment: INCREASED BANDS (>20% BANDS)    Imaging: Imaging results have been reviewed  Assessment/Plan:   1. Principal Problem: 2.   Neutropenic fever 3. Active Problems: 4.   Type II or unspecified type diabetes mellitus without mention of complication, not stated as uncontrolled 5.   Essential hypertension, benign 6.   Pure hypercholesterolemia 7.   Breast cancer of upper-outer quadrant of right female breast 8.   Anemia of chronic disease 9.   Thrombocytopenia 10.    Hypokalemia 11.   Pancytopenia due to antineoplastic chemotherapy 12.   New onset atrial fibrillation 13.   Hypothyroidism 14.   Chronic diastolic CHF (congestive heart failure), NYHA class 2 15.   Atrial fibrillation with RVR 16.   Preoperative cardiovascular examination 17.   Time Spent Directly with Patient:  10 minutes  Length of Stay:  LOS: 4 days   Pt converted to NSR at 1:00 am on IV amio and dilt. VSS. Can D/C iv dilt, change amio to PO and cont load  (400 mg PO BID X 1 wk, 200 mg PO BID for 1 week then 200 mg daily) as well as BB. Hopefully will maintain NSR now that acute illness is being Rx. Will S/O and follow up as OP. Call if we can be of further assistance  Vikkie Goeden J 12/12/2013, 7:47 AM

## 2013-12-12 NOTE — Progress Notes (Signed)
ANTICOAGULATION CONSULT NOTE - Follow-up  Pharmacy Consult for Eliquis Indication: atrial fibrillation  No Known Allergies  Patient Measurements: Height: 5\' 7"  (170.2 cm) Weight: 196 lb 13.9 oz (89.3 kg) IBW/kg (Calculated) : 61.6   Vital Signs: Temp: 98.1 F (36.7 C) (09/29 0800) Temp src: Oral (09/29 0800) BP: 177/85 mmHg (09/29 0800) Pulse Rate: 91 (09/29 0800)  Labs:  Recent Labs  12/10/13 0335 12/11/13 0350 12/12/13 0350  HGB 8.9* 9.3* 8.8*  HCT 26.5* 28.2* 26.9*  PLT 90* 108* 134*  CREATININE 0.65 0.63  --     Estimated Creatinine Clearance: 61.2 ml/min (by C-G formula based on Cr of 0.63).   Medical History: Past Medical History  Diagnosis Date  . Hypertension   . Hyperlipidemia   . Diabetes mellitus without complication   . Thyroid disease     hypothyroidism  . Anemia   . Former smoker   . Multinodular goiter   . Complication of anesthesia     slow to wake up  . Cancer     right breast     Assessment: 77 yoF admitted with neutropenic fever found to be in a-fib with RVR.  Due to patient's high risk of thromboembolism (CHADVASC score 5), MD wishes to begin anticoagulation with Eliquis.   Converted to NSR overnight. Plan to d/c IV diltiazem and change amiodarone from IV to PO per cardiology.  80 year old patient, weight 89 kg, and SCr 0.63 (from 9/28).  Patient does not meet criteria for reduced dose.  CBC:  Hgb and platelets low but stable.  No bleeding reported/documented per nursing.  Goal of Therapy:  Reduce risk of stroke Monitor platelets by anticoagulation protocol: Yes   Plan:  1.  Continue Apixaban at current dose of 5 mg PO BID. 2.  Pharmacist education done on 9/27.  3.  Continue to monitor for signs/sx of bleeding.   Lindell Spar, PharmD, BCPS Pager: [phone] 12/12/2013 9:18 AM

## 2013-12-12 NOTE — Progress Notes (Signed)
2D Echocardiogram has been performed.  Whitney [name] 12/12/2013, 10:13 AM

## 2013-12-13 LAB — CBC WITH DIFFERENTIAL/PLATELET
Basophils Absolute: 0 10*3/uL (ref 0.0–0.1)
Basophils Relative: 0 % (ref 0–1)
Eosinophils Absolute: 0 10*3/uL (ref 0.0–0.7)
Eosinophils Relative: 0 % (ref 0–5)
HCT: 27 % — ABNORMAL LOW (ref 36.0–46.0)
Hemoglobin: 9 g/dL — ABNORMAL LOW (ref 12.0–15.0)
Lymphocytes Relative: 7 % — ABNORMAL LOW (ref 12–46)
Lymphs Abs: 0.4 10*3/uL — ABNORMAL LOW (ref 0.7–4.0)
MCH: 28.6 pg (ref 26.0–34.0)
MCHC: 33.3 g/dL (ref 30.0–36.0)
MCV: 85.7 fL (ref 78.0–100.0)
Monocytes Absolute: 0.5 10*3/uL (ref 0.1–1.0)
Monocytes Relative: 8 % (ref 3–12)
Neutro Abs: 4.8 10*3/uL (ref 1.7–7.7)
Neutrophils Relative %: 85 % — ABNORMAL HIGH (ref 43–77)
Platelets: 212 10*3/uL (ref 150–400)
RBC: 3.15 MIL/uL — ABNORMAL LOW (ref 3.87–5.11)
RDW: 16.7 % — ABNORMAL HIGH (ref 11.5–15.5)
WBC: 5.7 10*3/uL (ref 4.0–10.5)

## 2013-12-13 LAB — BASIC METABOLIC PANEL
Anion gap: 12 (ref 5–15)
BUN: 9 mg/dL (ref 6–23)
CO2: 25 mEq/L (ref 19–32)
Calcium: 9.2 mg/dL (ref 8.4–10.5)
Chloride: 101 mEq/L (ref 96–112)
Creatinine, Ser: 0.67 mg/dL (ref 0.50–1.10)
GFR calc Af Amer: >90 mL/min (ref >90)
GFR calc non Af Amer: 79 mL/min — ABNORMAL LOW (ref >90)
Glucose, Bld: 142 mg/dL — ABNORMAL HIGH (ref 70–99)
Potassium: 3.3 mEq/L — ABNORMAL LOW (ref 3.7–5.3)
Sodium: 138 mEq/L (ref 137–147)

## 2013-12-13 LAB — GLUCOSE, CAPILLARY
Glucose-Capillary: 142 mg/dL — ABNORMAL HIGH (ref 70–99)
Glucose-Capillary: 128 mg/dL — ABNORMAL HIGH (ref 70–99)
Glucose-Capillary: 104 mg/dL — ABNORMAL HIGH (ref 70–99)
Glucose-Capillary: 133 mg/dL — ABNORMAL HIGH (ref 70–99)
Glucose-Capillary: 125 mg/dL — ABNORMAL HIGH (ref 70–99)

## 2013-12-13 MED ORDER — POTASSIUM CHLORIDE CRYS ER 20 MEQ PO TBCR
40.0000 meq | EXTENDED_RELEASE_TABLET | Freq: Four times a day (QID) | ORAL | Status: AC
  Administered 2013-12-13 (×2): 40 meq via ORAL
  Filled 2013-12-13 (×2): qty 2

## 2013-12-13 MED ORDER — LEVOFLOXACIN 500 MG PO TABS
500.0000 mg | ORAL_TABLET | Freq: Every day | ORAL | Status: DC
  Administered 2013-12-13 – 2013-12-14 (×2): 500 mg via ORAL
  Filled 2013-12-13 (×2): qty 1

## 2013-12-13 NOTE — Progress Notes (Signed)
[facility] PROGRESS NOTE  Whitney [name] GGY:[id number] DOB: Dec 03, 1927 DOA: 12/08/2013 PCP: Odette Fraction, MD  Assessment/Plan: 1. Neutropenia fever -Patient with history of breast cancer undergoing systemic therapy with cyclophosphamide and doxorubicin -Reported fevers and chills at home -Chest x-ray showed no acute abnormalities, urinalysis normal, nonfocal on physical exam -Blood cultures are positive, one out of two, for Pseudomonas aeruginosa. Organism is pan susceptible. Will continue IV Zosyn for now, I stopped vancomycin on 12/11/2013 -Repeat 2D echo did not show significant change from previous echo -Transitioned to oral Levaquin 2. Sepsis -Present on admission, evidenced by positive blood cultures, HR's in the 130's, neutropenia, functional decline -Blood cultures growing Pseudomonas aeruginosa, organism susceptible to levaquin -CXR and U/A unremarkable, nonfocal on exam.  -Repeat blood cultures on 12/10/2013 negative to date, will follow  3.  Atrial fibrillation rapid ventricular response, new diagnosis -Patient without significant cardiac history, had reported palpitations over the past week found to be in A. fib with RVR -Continue Cardizem drip, close monitoring in the step down unit -Has history of hypothyroidism, TSH 0.551 -Transthoracic echocardiogram performed on 11/08/2013 which showed EF of 65-70% without wall motion abnormalities, grade 2 diastolic dysfunction -I suspect underlying infectious process and perhaps dehydration may have precipitated A. fib with RVR -Has a CHADVASC score or 5, having a high risk for thromboembolism, risks and benefits of anticoagulation discussed with patient and daughter at bedside -Sarted Eliquis 5 mg PO BID -Patient converting to sinus rhythm, I discussed case with cardiology, will discontinue IV Amio and IV Cardizem, started on Amio 400 mg PO BID, continue Metoprolol 100 mg PO BID  4. Type 2 diabetes mellitus -Perform  Accu-Cheks q. a.c. and each bedtime -On metformin therapy  5.  Hypertension -Patient with history of hypertension,  -Metoprolol increased to 100 mg daily, restarting IV Cardizem today given hypertension  6.  Hypothyroidism -TSH within normal limits -Continue Synthroid 50 mcg by mouth daily  7. Hypokalemia -Replaced  Code Status: Full Code Family Communication: I spoke to patient's daughters Disposition Plan: Patient evaluated by PT, recommending [facility] services. Anticipate discharge home in the next 24 hours   Antibiotics:  IV vancomycin Stopped on 12/12/2013  IV Zosyn Stopped on 12/12/2013  Levaquin  HPI/Subjective: Patient is a pleasant 80 year old female with a past medical history of stage II breast cancer, admitted to the medicine service on 12/08/2013 presenting with generalized weakness and palpitations. Patient found to be in A. fib with RVR, started on a Cardizem drip and admitted to the step down unit. She also reported having intermittent fevers and chills at home, started on broad-spectrum IV antibiotic therapy with Zosyn and Vancomycin for neutropenic fever. Patient is doing well this morning, remains in sinus rhythm. Labs stable.    Objective: Filed Vitals:   12/13/13 1320  BP: 121/92  Pulse: 79  Temp: 99 F (37.2 C)  Resp: 20    Intake/Output Summary (Last 24 hours) at 12/13/13 1415 Last data filed at 12/13/13 1230  Gross per 24 hour  Intake    910 ml  Output      1 ml  Net    909 ml   Filed Weights   12/09/13 0500 12/10/13 0459 12/12/13 0200  Weight: 88 kg (194 lb 0.1 oz) 88.5 kg (195 lb 1.7 oz) 89.3 kg (196 lb 13.9 oz)    Exam:   General:  Patient is in no acute distress, she is nontoxic appearing awake and alert  Cardiovascular: Regular rate and rhythm normal S1-S2, tachycardic,  no extremity edema  Respiratory: Normal respiratory effort, lungs clear to auscultation bilaterally no wheezing rhonchi or rales  Abdomen: Soft nontender  nondistended  Musculoskeletal: No edema  Data Reviewed: Basic Metabolic Panel:  Recent Labs Lab 12/08/13 1729 12/08/13 1845 12/08/13 2315 12/09/13 0342 12/10/13 0335 12/11/13 0350 12/13/13 0457  NA 136*  --  137 137 138 137 138  K 3.2*  --  3.3* 3.4* 3.6* 3.7 3.3*  CL 94*  --  98 99 102 102 101  CO2 29  --  28 27 26 25 25   GLUCOSE 176*  --  198* 146* 145* 155* 142*  BUN 14  --  15 12 9 9 9   CREATININE 0.79  --  0.83 0.73 0.65 0.63 0.67  CALCIUM 9.3  --  8.8 8.7 8.6 8.6 9.2  MG  --  1.4* 1.4*  --   --   --   --   PHOS  --   --  2.5  --   --   --   --    Liver Function Tests:  Recent Labs Lab 12/08/13 1729 12/08/13 2315 12/09/13 0342  AST 9 7 8   ALT 10 9 8   ALKPHOS 57 51 52  BILITOT 0.5 0.4 0.4  PROT 6.0 5.4* 5.5*  ALBUMIN 2.9* 2.5* 2.5*   No results found for this basename: LIPASE, AMYLASE,  in the last 168 hours No results found for this basename: AMMONIA,  in the last 168 hours CBC:  Recent Labs Lab 12/08/13 1729 12/08/13 2315 12/09/13 0342 12/10/13 0335 12/11/13 0350 12/12/13 0350 12/13/13 0457  WBC 0.3* 0.4* 0.6* 2.1* 3.7* 4.5 5.7  NEUTROABS 0.2* 0.1*  --   --  3.1 3.7 4.8  HGB 9.5* 8.8* 9.4* 8.9* 9.3* 8.8* 9.0*  HCT 27.7* 26.2* 27.7* 26.5* 28.2* 26.9* 27.0*  MCV 84.2 84.8 85.0 85.5 86.0 86.2 85.7  PLT 106* 91* 104* 90* 108* 134* 212   Cardiac Enzymes:  Recent Labs Lab 12/08/13 1849  TROPONINI <0.30   BNP (last 3 results) No results found for this basename: PROBNP,  in the last 8760 hours CBG:  Recent Labs Lab 12/12/13 1213 12/12/13 1720 12/12/13 2106 12/13/13 0750 12/13/13 1148  GLUCAP 116* 163* 142* 128* 104*    Recent Results (from the past 240 hour(s))  CULTURE, BLOOD (ROUTINE X 2)     Status: None   Collection Time    12/08/13  6:30 PM      Result Value Ref Range Status   Specimen Description BLOOD LEFT ANTECUBITAL   Final   Special Requests BOTTLES DRAWN AEROBIC AND ANAEROBIC 5ML   Final   Culture  Setup Time     Final    Value: 12/08/2013 22:25     Performed at [facility]   Culture     Final   Value:        BLOOD CULTURE RECEIVED NO GROWTH TO DATE CULTURE WILL BE HELD FOR 5 DAYS BEFORE ISSUING A FINAL NEGATIVE REPORT     Performed at [facility]   Report Status PENDING   Incomplete  CULTURE, BLOOD (ROUTINE X 2)     Status: None   Collection Time    12/08/13  6:49 PM      Result Value Ref Range Status   Specimen Description BLOOD RIGHT ANTECUBITAL   Final   Special Requests BOTTLES DRAWN AEROBIC AND ANAEROBIC 5ML   Final   Culture  Setup Time     Final  Value: 12/08/2013 22:25     Performed at [facility]   Culture     Final   Value: PSEUDOMONAS AERUGINOSA     Note: Gram Stain Report Called to,Read Back By and Verified With: VERA WILLIAMS 12/09/13 @ 6:16PM BY RUSCOE A.     Performed at [facility]   Report Status 12/11/2013 FINAL   Final   Organism ID, Bacteria PSEUDOMONAS AERUGINOSA   Final  URINE CULTURE     Status: None   Collection Time    12/08/13  6:58 PM      Result Value Ref Range Status   Specimen Description URINE, CATHETERIZED   Final   Special Requests NONE   Final   Culture  Setup Time     Final   Value: 12/09/2013 00:30     Performed at [place]     Final   Value: NO GROWTH     Performed at [facility]   Culture     Final   Value: NO GROWTH     Performed at [facility]   Report Status 12/10/2013 FINAL   Final  MRSA PCR SCREENING     Status: None   Collection Time    12/08/13 10:55 PM      Result Value Ref Range Status   MRSA by PCR NEGATIVE  NEGATIVE Final   Comment:            The GeneXpert MRSA Assay (FDA     approved for NASAL specimens     only), is one component of a     comprehensive MRSA colonization     surveillance program. It is not     intended to diagnose MRSA     infection nor to guide or     monitor treatment for     MRSA infections.  CULTURE, BLOOD (ROUTINE X 2)      Status: None   Collection Time    12/10/13  7:11 AM      Result Value Ref Range Status   Specimen Description BLOOD LEFT ARM   Final   Special Requests BOTTLES DRAWN AEROBIC ONLY 3CC   Final   Culture  Setup Time     Final   Value: 12/10/2013 13:29     Performed at [facility]   Culture     Final   Value:        BLOOD CULTURE RECEIVED NO GROWTH TO DATE CULTURE WILL BE HELD FOR 5 DAYS BEFORE ISSUING A FINAL NEGATIVE REPORT     Performed at [facility]   Report Status PENDING   Incomplete  CULTURE, BLOOD (ROUTINE X 2)     Status: None   Collection Time    12/10/13  7:18 AM      Result Value Ref Range Status   Specimen Description BLOOD LEFT ARM   Final   Special Requests BOTTLES DRAWN AEROBIC ONLY 5CC   Final   Culture  Setup Time     Final   Value: 12/10/2013 13:29     Performed at [facility]   Culture     Final   Value:        BLOOD CULTURE RECEIVED NO GROWTH TO DATE CULTURE WILL BE HELD FOR 5 DAYS BEFORE ISSUING A FINAL NEGATIVE REPORT     Performed at [facility]   Report Status PENDING   Incomplete     Studies:  No results found.  Scheduled Meds: . amiodarone  400 mg Oral BID  . apixaban  5 mg Oral BID  . atorvastatin  10 mg Oral Daily  . cholecalciferol  2,000 Units Oral Daily  . ferrous sulfate  325 mg Oral QPC breakfast  . insulin aspart  0-15 Units Subcutaneous TID WC  . insulin aspart  0-5 Units Subcutaneous QHS  . levofloxacin  500 mg Oral Daily  . levothyroxine  50 mcg Oral QAC breakfast  . metFORMIN  500 mg Oral Q breakfast  . metFORMIN  1,000 mg Oral QAC supper  . metoprolol tartrate  100 mg Oral BID  . pioglitazone  30 mg Oral Q breakfast  . potassium chloride SA  20 mEq Oral Daily  . potassium chloride  40 mEq Oral Q6H  . sodium chloride  3 mL Intravenous Q12H  . vitamin B-12  1,000 mcg Oral Daily   Continuous Infusions:    Principal Problem:   Neutropenic fever Active Problems:   Type II or unspecified type  diabetes mellitus without mention of complication, not stated as uncontrolled   Essential hypertension, benign   Pure hypercholesterolemia   Breast cancer of upper-outer quadrant of right female breast   Anemia of chronic disease   Thrombocytopenia   Hypokalemia   Pancytopenia due to antineoplastic chemotherapy   New onset atrial fibrillation   Hypothyroidism   Chronic diastolic CHF (congestive heart failure), NYHA class 2   Atrial fibrillation with RVR   Preoperative cardiovascular examination    Time spent: 25 min    Kelvin Cellar  [facility] Pager [phone]. If 7PM-7AM, please contact night-coverage at [email or web address], password [email or web address] 12/13/2013, 2:15 PM  LOS: 5 days

## 2013-12-13 NOTE — Evaluation (Signed)
Physical Therapy Evaluation Patient Details Name: Whitney [name] MRN: [id number] DOB: 1927/06/06 Today's Date: 12/13/2013   History of Present Illness  80 year old female with a past medical history of stage II breast cancer, presented with generalized weakness and palpitations admitted 12/08/13 for neutropenic fever and new onset atrial fibrillation.  Clinical Impression  Pt currently with functional limitations due to the deficits listed below (see PT Problem List).  Pt will benefit from skilled PT to increase their independence and safety with mobility to allow discharge to the venue listed below.  Pt reports not ambulating since admission however did fairly well mobilizing.  Only one instance of unsteadiness looking around environment however pt self corrected.     Follow Up Recommendations Home health PT    Equipment Recommendations  None recommended by PT    Recommendations for Other Services       Precautions / Restrictions Precautions Precautions: Fall      Mobility  Bed Mobility Overal bed mobility: Modified Independent                Transfers Overall transfer level: Needs assistance Equipment used: None Transfers: Sit to/from Stand Sit to Stand: Min guard         General transfer comment: min/guard for safety, pt reports first time ambulating  Ambulation/Gait Ambulation/Gait assistance: Min guard Ambulation Distance (Feet): 240 Feet Assistive device: None Gait Pattern/deviations: Step-through pattern Gait velocity: decr   General Gait Details: pt with mild unsteadiness during looking toward right side environment however self correct, no other unsteadiness observed, recommended having RW readily available at home in case of fatigue or weakness  Stairs            Wheelchair Mobility    Modified Rankin (Stroke Patients Only)       Balance Overall balance assessment: Needs assistance                           High level  balance activites: Backward walking;Turns;Sudden stops;Head turns High Level Balance Comments: performed above without LOB min/guard for safety             Pertinent Vitals/Pain Pain Assessment: No/denies pain HR up to 116 during ambulation.    Home Living Family/patient expects to be discharged to:: Private residence Living Arrangements: Children;Other relatives (granddaughter) Available Help at Discharge: Family Type of Home: House Home Access: Level entry     Home Layout: One level Home Equipment: Environmental consultant - 2 wheels      Prior Function Level of Independence: Independent               Hand Dominance        Extremity/Trunk Assessment               Lower Extremity Assessment: Overall WFL for tasks assessed         Communication   Communication: No difficulties  Cognition Arousal/Alertness: Awake/alert Behavior During Therapy: WFL for tasks assessed/performed Overall Cognitive Status: Within Functional Limits for tasks assessed                      General Comments      Exercises        Assessment/Plan    PT Assessment Patient needs continued PT services  PT Diagnosis Difficulty walking   PT Problem List Decreased mobility;Decreased strength  PT Treatment Interventions DME instruction;Gait training;Functional mobility training;Therapeutic activities;Therapeutic exercise;Patient/family education   PT Goals (Current goals can  be found in the Care Plan section) Acute Rehab PT Goals PT Goal Formulation: With patient Time For Goal Achievement: 12/20/13 Potential to Achieve Goals: Good    Frequency Min 3X/week   Barriers to discharge        Co-evaluation               End of Session   Activity Tolerance: Patient tolerated treatment well Patient left: in chair;with call bell/phone within reach           Time: 1137-1146 PT Time Calculation (min): 9 min   Charges:   PT Evaluation $Initial PT Evaluation Tier I: 1  Procedure     PT G Codes:          Melinna Linarez,KATHrine E 12/13/2013, 1:59 PM Carmelia Bake, PT, DPT 12/13/2013 Pager: [phone]

## 2013-12-14 ENCOUNTER — Ambulatory Visit: Payer: Medicare Other

## 2013-12-14 ENCOUNTER — Other Ambulatory Visit: Payer: Medicare Other

## 2013-12-14 ENCOUNTER — Ambulatory Visit: Payer: Medicare Other | Admitting: Adult Health

## 2013-12-14 DIAGNOSIS — C50411 Malignant neoplasm of upper-outer quadrant of right female breast: Secondary | ICD-10-CM

## 2013-12-14 DIAGNOSIS — D638 Anemia in other chronic diseases classified elsewhere: Secondary | ICD-10-CM

## 2013-12-14 DIAGNOSIS — I5032 Chronic diastolic (congestive) heart failure: Secondary | ICD-10-CM

## 2013-12-14 DIAGNOSIS — I4891 Unspecified atrial fibrillation: Secondary | ICD-10-CM

## 2013-12-14 DIAGNOSIS — I499 Cardiac arrhythmia, unspecified: Secondary | ICD-10-CM

## 2013-12-14 DIAGNOSIS — D61810 Antineoplastic chemotherapy induced pancytopenia: Secondary | ICD-10-CM

## 2013-12-14 DIAGNOSIS — T451X5A Adverse effect of antineoplastic and immunosuppressive drugs, initial encounter: Secondary | ICD-10-CM

## 2013-12-14 DIAGNOSIS — D709 Neutropenia, unspecified: Secondary | ICD-10-CM

## 2013-12-14 HISTORY — DX: Cardiac arrhythmia, unspecified: I49.9

## 2013-12-14 LAB — CBC WITH DIFFERENTIAL/PLATELET
Basophils Absolute: 0 10*3/uL (ref 0.0–0.1)
Basophils Relative: 0 % (ref 0–1)
Eosinophils Absolute: 0 10*3/uL (ref 0.0–0.7)
Eosinophils Relative: 0 % (ref 0–5)
HCT: 25.6 % — ABNORMAL LOW (ref 36.0–46.0)
Hemoglobin: 8.5 g/dL — ABNORMAL LOW (ref 12.0–15.0)
Lymphocytes Relative: 8 % — ABNORMAL LOW (ref 12–46)
Lymphs Abs: 0.6 10*3/uL — ABNORMAL LOW (ref 0.7–4.0)
MCH: 28.4 pg (ref 26.0–34.0)
MCHC: 33.2 g/dL (ref 30.0–36.0)
MCV: 85.6 fL (ref 78.0–100.0)
Monocytes Absolute: 0.3 10*3/uL (ref 0.1–1.0)
Monocytes Relative: 5 % (ref 3–12)
Neutro Abs: 6 10*3/uL (ref 1.7–7.7)
Neutrophils Relative %: 87 % — ABNORMAL HIGH (ref 43–77)
Platelets: 286 10*3/uL (ref 150–400)
RBC: 2.99 MIL/uL — ABNORMAL LOW (ref 3.87–5.11)
RDW: 16.6 % — ABNORMAL HIGH (ref 11.5–15.5)
WBC: 6.9 10*3/uL (ref 4.0–10.5)

## 2013-12-14 LAB — GLUCOSE, CAPILLARY: Glucose-Capillary: 131 mg/dL — ABNORMAL HIGH (ref 70–99)

## 2013-12-14 LAB — CULTURE, BLOOD (ROUTINE X 2): Culture: NO GROWTH

## 2013-12-14 MED ORDER — METFORMIN HCL ER 500 MG PO TB24: 500.0000 mg | ORAL_TABLET | Freq: Every day | ORAL | Status: DC

## 2013-12-14 MED ORDER — METOPROLOL TARTRATE 100 MG PO TABS: 100.0000 mg | ORAL_TABLET | Freq: Two times a day (BID) | ORAL | Status: DC

## 2013-12-14 MED ORDER — AMIODARONE HCL 400 MG PO TABS: ORAL_TABLET | ORAL | Status: DC

## 2013-12-14 MED ORDER — APIXABAN 5 MG PO TABS: 5.0000 mg | ORAL_TABLET | Freq: Two times a day (BID) | ORAL | Status: DC

## 2013-12-14 MED ORDER — LEVOFLOXACIN 500 MG PO TABS: 500.0000 mg | ORAL_TABLET | Freq: Every day | ORAL | Status: DC

## 2013-12-14 NOTE — Discharge Summary (Signed)
Physician Discharge Summary  Whitney [name] EVO:[id number] DOB: 01/08/1928 DOA: 12/08/2013  PCP: Odette Fraction, MD  Admit date: 12/08/2013 Discharge date: 12/14/2013  Time spent: 35 minutes  Recommendations for Outpatient Follow-up:  1. Please follow up on BMP and CBC in 1 week 2. Please follow up on her Blood Pressures, her metoprolol was increased to 100 mg PO BID 3. She was set up with [facility] RN and PT prior to discharge   Discharge Diagnoses:  Principal Problem:   Neutropenic fever Active Problems:   Type II or unspecified type diabetes mellitus without mention of complication, not stated as uncontrolled   Essential hypertension, benign   Pure hypercholesterolemia   Breast cancer of upper-outer quadrant of right female breast   Anemia of chronic disease   Thrombocytopenia   Hypokalemia   Pancytopenia due to antineoplastic chemotherapy   New onset atrial fibrillation   Hypothyroidism   Chronic diastolic CHF (congestive heart failure), NYHA class 2   Atrial fibrillation with RVR   Preoperative cardiovascular examination   Discharge Condition: Stable  Diet recommendation: Heart Healthy  Filed Weights   12/10/13 0459 12/12/13 0200 12/14/13 0625  Weight: 88.5 kg (195 lb 1.7 oz) 89.3 kg (196 lb 13.9 oz) 89.4 kg (197 lb 1.5 oz)    History of present illness:  80 year old female with past medical history of stage 2 breast cancer of the right upper quadrant of the breast (initially diagnosed in 10/2013, invasive ductal carcinoma), started chemotherapy with cyclophosphamide and doxorubicin on 11/16/2013 and second dose given 11/30/2013 with neulasta support who presented to [facility] ED 12/08/2013 not feeling well. Patient is a 42 she felt weak and fatigued for past few days. She had a fever at home and to one dose of Tylenol but did not feel significant improvement. She reported some nausea but no vomiting. No diarrhea. No chest pain, shortness of breath or palpitations.  No complaints of abdominal pain. No lightheadedness or loss of consciousness. No cough. No reports of blood in the stool or urine.  In ED, blood pressure is 90/51 and subsequently 170/152, HR 45-133, RR 10-29, T max 100.4 F and oxygen saturation 93% on 2 L nasal cannula oxygen support. Blood work revealed white blood cell count of 0.3, hemoglobin 9.5 and platelets 106. Potassium was 3.2 and supplemented in ED. The initial troponin level was within normal limits. 12-lead EKG showed atrial fibrillation. Patient received 5 mg IV Cardizem and her heart rate was subsequently 101. She could not be started on Cardizem drip due to soft BP. Chest x-ray showed no acute abnormalities. Urinalysis showed trace leukocytes. Patient was started on broad-spectrum antibiotics for treatment of neutropenic fever and sepsis and she was admitted to step down unit for further management.    Hospital Course:  Patient is a pleasant 80 year old female with a past medical history of stage II breast cancer, admitted to the medicine service on 12/08/2013 presenting with generalized weakness and palpitations. Patient found to be in A. fib with RVR, started on a Cardizem drip and admitted to the step down unit. She also reported having intermittent fevers and chills at home, started on broad-spectrum IV antibiotic therapy with Zosyn and Vancomycin for neutropenic fever. Blood cultures that were obtained on admission grew Pseudomonas aeruginosa, 1 out of 2. This organism was susceptible to cephalosporins and quinolones. During this hospitalization she had been treated with IV Zosyn, then transitioned to Levaquin. With regard to her atrial fibrillation rapid ventricular response (new diagnosis) she  was started on anticoagulation with Eliquis since she had a [place] Vasc score of 5. Cardiology was consulted given difficulties controlling heart rates. She was treated with IV amiodarone which was later transitioned to oral amiodarone at 400 mg by  mouth twice a day. Cardiology recommending amiodarone 400 mg by mouth twice a day times one week, followed by 200 mg by mouth twice a day x1 week then 200 mg by mouth daily. Patient showed gradual clinical improvement as she converted to sinus rhythm. Repeat blood cultures drawn on 12/10/2013 remained sterile. She worked well with physical therapy who recommended home health services. She was discharged in stable condition on 12/14/2013 to her home. 1. Neutropenia fever -Patient with history of breast cancer undergoing systemic therapy with cyclophosphamide and doxorubicin  -Reported fevers and chills at home  -Chest x-ray showed no acute abnormalities, urinalysis normal, nonfocal on physical exam  -Blood cultures are positive, one out of two, for Pseudomonas aeruginosa. Organism is pan susceptible.  -Repeat 2D echo did not show significant change from previous echo  -Repeat blood cultures remained negative.  -Transitioned to oral Levaquin, plan for 2 weeks of antimicrobial therapy.  2. Sepsis  -Present on admission, evidenced by positive blood cultures, HR's in the 130's, neutropenia, functional decline  -Blood cultures growing Pseudomonas aeruginosa, organism susceptible to levaquin  -CXR and U/A unremarkable, nonfocal on exam.  -Repeat blood cultures on 12/10/2013 negative to date 3. Atrial fibrillation rapid ventricular response, new diagnosis  -Patient without significant cardiac history, had reported palpitations over the past week found to be in A. fib with RVR  -Continue Cardizem drip, close monitoring in the step down unit  -Has history of hypothyroidism, TSH 0.551  -Transthoracic echocardiogram performed on 11/08/2013 which showed EF of 65-70% without wall motion abnormalities, grade 2 diastolic dysfunction  -I suspect underlying infectious process and perhaps dehydration may have precipitated A. fib with RVR  -Has a CHADVASC score or 5, having a high risk for thromboembolism, risks and  benefits of anticoagulation discussed with patient and daughter at bedside  -Sarted Eliquis 5 mg PO BID  -Patient converting to sinus rhythm, I discussed case with cardiology, changed to Amio 400 mg PO BID from IV gtt, continue Metoprolol 100 mg PO BID  4. Type 2 diabetes mellitus  -Perform Accu-Cheks q. a.c. and each bedtime  -On metformin therapy  5. Hypertension  -Patient with history of hypertension,  -Metoprolol increased to 100 mg daily 6. Hypothyroidism  -TSH within normal limits  -Continue Synthroid 50 mcg by mouth daily  7. Hypokalemia  -Replaced   Consultations:  Cardiology  Physical Therapy  Discharge Exam: Filed Vitals:   12/14/13 0625  BP: 152/70  Pulse: 80  Temp: 99 F (37.2 C)  Resp: 20    General: No acute distress, she is awake and alert, ambulated down the hallway Cardiovascular: Regular rate and rhythm, normal S1S2 Respiratory: Regular rate and rhythm, normal S1S2 Abdomen: Soft nontender nondistended  Discharge Instructions You were cared for by a hospitalist during your hospital stay. If you have any questions about your discharge medications or the care you received while you were in the hospital after you are discharged, you can call the unit and asked to speak with the hospitalist on call if the hospitalist that took care of you is not available. Once you are discharged, your primary care physician will handle any further medical issues. Please note that NO REFILLS for any discharge medications will be authorized once you are discharged, as  it is imperative that you return to your primary care physician (or establish a relationship with a primary care physician if you do not have one) for your aftercare needs so that they can reassess your need for medications and monitor your lab values.  Discharge Instructions   Call MD for:  difficulty breathing, headache or visual disturbances    Complete by:  As directed      Call MD for:  extreme fatigue     Complete by:  As directed      Call MD for:  hives    Complete by:  As directed      Call MD for:  persistant dizziness or light-headedness    Complete by:  As directed      Call MD for:  persistant nausea and vomiting    Complete by:  As directed      Call MD for:  redness, tenderness, or signs of infection (pain, swelling, redness, odor or green/yellow discharge around incision site)    Complete by:  As directed      Call MD for:  severe uncontrolled pain    Complete by:  As directed      Call MD for:  temperature >100.4    Complete by:  As directed      Diet - low sodium heart healthy    Complete by:  As directed      Increase activity slowly    Complete by:  As directed           Current Discharge Medication List    START taking these medications   Details  amiodarone (PACERONE) 400 MG tablet Take 400 mg PO BID for 1 week followed by 200 mg PO q daily for 1 week, followed by 200 mg PO q daily thereafter. QTY sufficient Qty: 90 tablet, Refills: 1    apixaban (ELIQUIS) 5 MG TABS tablet Take 1 tablet (5 mg total) by mouth 2 (two) times daily. Qty: 60 tablet, Refills: 1    levofloxacin (LEVAQUIN) 500 MG tablet Take 1 tablet (500 mg total) by mouth daily. Qty: 8 tablet, Refills: 0    metoprolol (LOPRESSOR) 100 MG tablet Take 1 tablet (100 mg total) by mouth 2 (two) times daily. Qty: 60 tablet, Refills: 1      CONTINUE these medications which have NOT CHANGED   Details  atorvastatin (LIPITOR) 10 MG tablet Take 10 mg by mouth daily.    Cholecalciferol (VITAMIN D) 2000 UNITS CAPS Take 2,000 Units by mouth daily.    !! dexamethasone (DECADRON) 1 MG tablet Take 1 mg by mouth at bedtime as needed (take the night before infusion).    !! dexamethasone (DECADRON) 4 MG tablet Take 2 tablets by mouth once a day on the day after chemotherapy and then take 2 tablets two times a day for 2 days. Take with food. Qty: 30 tablet, Refills: 1   Associated Diagnoses: Breast cancer of  upper-outer quadrant of left female breast    ferrous sulfate 325 (65 FE) MG EC tablet Take 325 mg by mouth daily with breakfast.    furosemide (LASIX) 40 MG tablet Take 40 mg by mouth daily.     levothyroxine (SYNTHROID, LEVOTHROID) 50 MCG tablet Take 1 tablet (50 mcg total) by mouth daily before breakfast. Qty: 90 tablet, Refills: 0    lidocaine-prilocaine (EMLA) cream Apply 1 application topically as needed. Qty: 30 g, Refills: 0   Associated Diagnoses: Breast cancer of upper-outer quadrant of left female breast  LORazepam (ATIVAN) 0.5 MG tablet Take 1 tablet (0.5 mg total) by mouth every 6 (six) hours as needed (Nausea or vomiting). Qty: 30 tablet, Refills: 0   Associated Diagnoses: Breast cancer of upper-outer quadrant of left female breast    metFORMIN (GLUCOPHAGE-XR) 500 MG 24 hr tablet Take 500-1,000 mg by mouth 2 (two) times daily. Take 500 mg every morning and 1000 mg every evening    ondansetron (ZOFRAN) 8 MG tablet Take 1 tablet (8 mg total) by mouth 2 (two) times daily as needed. Start on the third day after chemotherapy. Qty: 30 tablet, Refills: 1   Associated Diagnoses: Breast cancer of upper-outer quadrant of left female breast    pioglitazone (ACTOS) 30 MG tablet Take 1 tablet (30 mg total) by mouth daily. Qty: 90 tablet, Refills: 0    Polyethyl Glycol-Propyl Glycol (SYSTANE OP) Place 1 drop into both eyes 3 (three) times daily.    potassium chloride SA (K-DUR,KLOR-CON) 20 MEQ tablet Take 20 mEq by mouth daily.    prochlorperazine (COMPAZINE) 10 MG tablet Take 1 tablet (10 mg total) by mouth every 6 (six) hours as needed (Nausea or vomiting). Qty: 30 tablet, Refills: 1   Associated Diagnoses: Breast cancer of upper-outer quadrant of left female breast    vitamin B-12 (CYANOCOBALAMIN) 1000 MCG tablet Take 1,000 mcg by mouth daily.     !! - Potential duplicate medications found. Please discuss with provider.    STOP taking these medications     aspirin 81 MG  tablet      lisinopril-hydrochlorothiazide (PRINZIDE,ZESTORETIC) 20-12.5 MG per tablet      metoprolol succinate (TOPROL-XL) 25 MG 24 hr tablet        No Known Allergies Follow-up Information   Follow up with [facility] TOM, MD In 1 week.   Specialty:  Family Medicine   Contact information:   [address] [place] [address] [phone]       Follow up with Lorretta Harp, MD In 2 weeks.   Specialty:  Cardiology   Contact information:   [address] [place] [address] [phone]       Follow up with [name], MD In 1 week.   Specialty:  Hematology and Oncology   Contact information:   [place] [address] [phone]        The results of significant diagnostics from this hospitalization (including imaging, microbiology, ancillary and laboratory) are listed below for reference.    Significant Diagnostic Studies: Dg Chest 2 View  12/08/2013   CLINICAL DATA:  Weakness and fever starting this morning, history breast cancer with most recent chemotherapy on 11/30/2013, hypertension, diabetes, former smoker  EXAM: CHEST  2 VIEW  COMPARISON:  11/15/2013  FINDINGS: RIGHT jugular Port-A-Cath with tip projecting over SVC.  Enlargement of cardiac silhouette.  Calcified tortuous thoracic aorta.  Pulmonary vascularity normal.  Slight prominent RIGHT superior mediastinal soft tissues unchanged.  Lungs clear.  No pleural effusion or pneumothorax.  No acute osseous findings.  IMPRESSION: Enlargement of cardiac silhouette.  No acute abnormalities.   Electronically Signed   By: Lavonia Dana M.D.   On: 12/08/2013 18:48   Dg Chest Port 1 View  11/15/2013   CLINICAL DATA:  Line placement.  EXAM: PORTABLE CHEST - 1 VIEW  COMPARISON:  11/13/2013  FINDINGS: Right-sided Port-A-Cath has been placed, tip overlying the level of the superior vena cava. The heart is enlarged. Aorta is tortuous. No pneumothorax. No focal  consolidations,  pleural effusions, or pulmonary edema.  IMPRESSION: 1. Interval placement of right-sided Port-A-Cath. 2. Cardiomegaly without pulmonary edema.   Electronically Signed   By: Shon Hale M.D.   On: 11/15/2013 09:59   Dg Fluoro Guide Cv Line-no Report  11/15/2013   CLINICAL DATA: left breast cancer   FLOURO GUIDE CV LINE  Fluoroscopy was utilized by the requesting physician.  No radiographic  interpretation.     Microbiology: Recent Results (from the past 240 hour(s))  CULTURE, BLOOD (ROUTINE X 2)     Status: None   Collection Time    12/08/13  6:30 PM      Result Value Ref Range Status   Specimen Description BLOOD LEFT ANTECUBITAL   Final   Special Requests BOTTLES DRAWN AEROBIC AND ANAEROBIC 5ML   Final   Culture  Setup Time     Final   Value: 12/08/2013 22:25     Performed at [facility]   Culture     Final   Value: NO GROWTH 5 DAYS     Performed at [facility]   Report Status 12/14/2013 FINAL   Final  CULTURE, BLOOD (ROUTINE X 2)     Status: None   Collection Time    12/08/13  6:49 PM      Result Value Ref Range Status   Specimen Description BLOOD RIGHT ANTECUBITAL   Final   Special Requests BOTTLES DRAWN AEROBIC AND ANAEROBIC 5ML   Final   Culture  Setup Time     Final   Value: 12/08/2013 22:25     Performed at [facility]   Culture     Final   Value: PSEUDOMONAS AERUGINOSA     Note: Gram Stain Report Called to,Read Back By and Verified With: VERA WILLIAMS 12/09/13 @ 6:16PM BY RUSCOE A.     Performed at [facility]   Report Status 12/11/2013 FINAL   Final   Organism ID, Bacteria PSEUDOMONAS AERUGINOSA   Final  URINE CULTURE     Status: None   Collection Time    12/08/13  6:58 PM      Result Value Ref Range Status   Specimen Description URINE, CATHETERIZED   Final   Special Requests NONE   Final   Culture  Setup Time     Final   Value: 12/09/2013 00:30     Performed at [facility]     Final   Value: NO GROWTH      Performed at [facility]   Culture     Final   Value: NO GROWTH     Performed at [facility]   Report Status 12/10/2013 FINAL   Final  MRSA PCR SCREENING     Status: None   Collection Time    12/08/13 10:55 PM      Result Value Ref Range Status   MRSA by PCR NEGATIVE  NEGATIVE Final   Comment:            The GeneXpert MRSA Assay (FDA     approved for NASAL specimens     only), is one component of a     comprehensive MRSA colonization     surveillance program. It is not     intended to diagnose MRSA     infection nor to guide or     monitor treatment for     MRSA infections.  CULTURE, BLOOD (ROUTINE X 2)  Status: None   Collection Time    12/10/13  7:11 AM      Result Value Ref Range Status   Specimen Description BLOOD LEFT ARM   Final   Special Requests BOTTLES DRAWN AEROBIC ONLY 3CC   Final   Culture  Setup Time     Final   Value: 12/10/2013 13:29     Performed at [facility]   Culture     Final   Value:        BLOOD CULTURE RECEIVED NO GROWTH TO DATE CULTURE WILL BE HELD FOR 5 DAYS BEFORE ISSUING A FINAL NEGATIVE REPORT     Performed at [facility]   Report Status PENDING   Incomplete  CULTURE, BLOOD (ROUTINE X 2)     Status: None   Collection Time    12/10/13  7:18 AM      Result Value Ref Range Status   Specimen Description BLOOD LEFT ARM   Final   Special Requests BOTTLES DRAWN AEROBIC ONLY 5CC   Final   Culture  Setup Time     Final   Value: 12/10/2013 13:29     Performed at [facility]   Culture     Final   Value:        BLOOD CULTURE RECEIVED NO GROWTH TO DATE CULTURE WILL BE HELD FOR 5 DAYS BEFORE ISSUING A FINAL NEGATIVE REPORT     Performed at [facility]   Report Status PENDING   Incomplete     Labs: Basic Metabolic Panel:  Recent Labs Lab 12/08/13 1729 12/08/13 1845 12/08/13 2315 12/09/13 0342 12/10/13 0335 12/11/13 0350 12/13/13 0457  NA 136*  --  137 137 138 137 138  K 3.2*  --  3.3*  3.4* 3.6* 3.7 3.3*  CL 94*  --  98 99 102 102 101  CO2 29  --  28 27 26 25 25   GLUCOSE 176*  --  198* 146* 145* 155* 142*  BUN 14  --  15 12 9 9 9   CREATININE 0.79  --  0.83 0.73 0.65 0.63 0.67  CALCIUM 9.3  --  8.8 8.7 8.6 8.6 9.2  MG  --  1.4* 1.4*  --   --   --   --   PHOS  --   --  2.5  --   --   --   --    Liver Function Tests:  Recent Labs Lab 12/08/13 1729 12/08/13 2315 12/09/13 0342  AST 9 7 8   ALT 10 9 8   ALKPHOS 57 51 52  BILITOT 0.5 0.4 0.4  PROT 6.0 5.4* 5.5*  ALBUMIN 2.9* 2.5* 2.5*   No results found for this basename: LIPASE, AMYLASE,  in the last 168 hours No results found for this basename: AMMONIA,  in the last 168 hours CBC:  Recent Labs Lab 12/08/13 2315  12/10/13 0335 12/11/13 0350 12/12/13 0350 12/13/13 0457 12/14/13 0515  WBC 0.4*  < > 2.1* 3.7* 4.5 5.7 6.9  NEUTROABS 0.1*  --   --  3.1 3.7 4.8 6.0  HGB 8.8*  < > 8.9* 9.3* 8.8* 9.0* 8.5*  HCT 26.2*  < > 26.5* 28.2* 26.9* 27.0* 25.6*  MCV 84.8  < > 85.5 86.0 86.2 85.7 85.6  PLT 91*  < > 90* 108* 134* 212 286  < > = values in this interval not displayed. Cardiac Enzymes:  Recent Labs Lab 12/08/13 1849  TROPONINI <0.30   BNP: BNP (  last 3 results) No results found for this basename: PROBNP,  in the last 8760 hours CBG:  Recent Labs Lab 12/13/13 0750 12/13/13 1148 12/13/13 1705 12/13/13 2138 12/14/13 0758  GLUCAP 128* 104* 133* 125* 131*       Signed:  Kinan Safley  [facility] 12/14/2013, 9:11 AM

## 2013-12-15 ENCOUNTER — Ambulatory Visit: Payer: Medicare Other | Admitting: Endocrinology

## 2013-12-15 ENCOUNTER — Ambulatory Visit: Payer: Medicare Other

## 2013-12-15 ENCOUNTER — Other Ambulatory Visit: Payer: Medicare Other

## 2013-12-16 LAB — CULTURE, BLOOD (ROUTINE X 2)
Culture: NO GROWTH
Culture: NO GROWTH

## 2013-12-21 ENCOUNTER — Ambulatory Visit (INDEPENDENT_AMBULATORY_CARE_PROVIDER_SITE_OTHER): Payer: Medicare Other | Admitting: Family Medicine

## 2013-12-21 ENCOUNTER — Encounter: Payer: Self-pay | Admitting: Family Medicine

## 2013-12-21 VITALS — BP 124/72 | HR 80 | Temp 98.4°F | Resp 18 | Wt 196.0 lb

## 2013-12-21 DIAGNOSIS — Z09 Encounter for follow-up examination after completed treatment for conditions other than malignant neoplasm: Secondary | ICD-10-CM

## 2013-12-21 LAB — COMPLETE METABOLIC PANEL WITH GFR
ALT: <8 U/L (ref 0–35)
AST: 11 U/L (ref 0–37)
Albumin: 3.2 g/dL — ABNORMAL LOW (ref 3.5–5.2)
Alkaline Phosphatase: 60 U/L (ref 39–117)
BUN: 22 mg/dL (ref 6–23)
CO2: 29 mEq/L (ref 19–32)
Calcium: 9.8 mg/dL (ref 8.4–10.5)
Chloride: 98 mEq/L (ref 96–112)
Creat: 1.24 mg/dL — ABNORMAL HIGH (ref 0.50–1.10)
GFR, Est African American: 46 mL/min — ABNORMAL LOW
GFR, Est Non African American: 40 mL/min — ABNORMAL LOW
Glucose, Bld: 120 mg/dL — ABNORMAL HIGH (ref 70–99)
Potassium: 3.1 mEq/L — ABNORMAL LOW (ref 3.5–5.3)
Sodium: 140 mEq/L (ref 135–145)
Total Bilirubin: 0.4 mg/dL (ref 0.2–1.2)
Total Protein: 5.8 g/dL — ABNORMAL LOW (ref 6.0–8.3)

## 2013-12-21 LAB — CBC WITH DIFFERENTIAL/PLATELET
Basophils Absolute: 0.1 10*3/uL (ref 0.0–0.1)
Basophils Relative: 1 % (ref 0–1)
Eosinophils Absolute: 0 10*3/uL (ref 0.0–0.7)
Eosinophils Relative: 0 % (ref 0–5)
HCT: 27.1 % — ABNORMAL LOW (ref 36.0–46.0)
Hemoglobin: 9.3 g/dL — ABNORMAL LOW (ref 12.0–15.0)
Lymphocytes Relative: 4 % — ABNORMAL LOW (ref 12–46)
Lymphs Abs: 0.5 10*3/uL — ABNORMAL LOW (ref 0.7–4.0)
MCH: 28 pg (ref 26.0–34.0)
MCHC: 34.3 g/dL (ref 30.0–36.0)
MCV: 81.6 fL (ref 78.0–100.0)
Monocytes Absolute: 1.8 10*3/uL — ABNORMAL HIGH (ref 0.1–1.0)
Monocytes Relative: 14 % — ABNORMAL HIGH (ref 3–12)
Neutro Abs: 10.1 10*3/uL — ABNORMAL HIGH (ref 1.7–7.7)
Neutrophils Relative %: 81 % — ABNORMAL HIGH (ref 43–77)
Platelets: 593 10*3/uL — ABNORMAL HIGH (ref 150–400)
RBC: 3.32 MIL/uL — ABNORMAL LOW (ref 3.87–5.11)
RDW: 16.8 % — ABNORMAL HIGH (ref 11.5–15.5)
WBC: 12.5 10*3/uL — ABNORMAL HIGH (ref 4.0–10.5)

## 2013-12-21 NOTE — Progress Notes (Signed)
Subjective:    Patient ID: Whitney [name], female    DOB: 07-27-1927, 80 y.o.   MRN: [id number]  HPI  Patient was recently admitted to hospital with neutropenic fever. I have copied discharge summary below for my reference:  Admit date: 12/08/2013  Discharge date: 12/14/2013  Time spent: 35 minutes  Recommendations for Outpatient Follow-up:  1. Please follow up on BMP and CBC in 1 week 2. Please follow up on her Blood Pressures, her metoprolol was increased to 100 mg PO BID 3. She was set up with [facility] RN and PT prior to discharge Discharge Diagnoses:  Principal Problem:  Neutropenic fever  Active Problems:  Type II or unspecified type diabetes mellitus without mention of complication, not stated as uncontrolled  Essential hypertension, benign  Pure hypercholesterolemia  Breast cancer of upper-outer quadrant of right female breast  Anemia of chronic disease  Thrombocytopenia  Hypokalemia  Pancytopenia due to antineoplastic chemotherapy  New onset atrial fibrillation  Hypothyroidism  Chronic diastolic CHF (congestive heart failure), NYHA class 2  Atrial fibrillation with RVR  Preoperative cardiovascular examination  Discharge Condition: Stable  Diet recommendation: Heart Healthy  Filed Weights    12/10/13 0459  12/12/13 0200  12/14/13 0625   Weight:  88.5 kg (195 lb 1.7 oz)  89.3 kg (196 lb 13.9 oz)  89.4 kg (197 lb 1.5 oz)   History of present illness:  80 year old female with past medical history of stage 2 breast cancer of the right upper quadrant of the breast (initially diagnosed in 10/2013, invasive ductal carcinoma), started chemotherapy with cyclophosphamide and doxorubicin on 11/16/2013 and second dose given 11/30/2013 with neulasta support who presented to [facility] ED 12/08/2013 not feeling well. Patient is a 42 she felt weak and fatigued for past few days. She had a fever at home and to one dose of Tylenol but did not feel significant improvement. She reported  some nausea but no vomiting. No diarrhea. No chest pain, shortness of breath or palpitations. No complaints of abdominal pain. No lightheadedness or loss of consciousness. No cough. No reports of blood in the stool or urine.  In ED, blood pressure is 90/51 and subsequently 170/152, HR 45-133, RR 10-29, T max 100.4 F and oxygen saturation 93% on 2 L nasal cannula oxygen support. Blood work revealed white blood cell count of 0.3, hemoglobin 9.5 and platelets 106. Potassium was 3.2 and supplemented in ED. The initial troponin level was within normal limits. 12-lead EKG showed atrial fibrillation. Patient received 5 mg IV Cardizem and her heart rate was subsequently 101. She could not be started on Cardizem drip due to soft BP. Chest x-ray showed no acute abnormalities. Urinalysis showed trace leukocytes. Patient was started on broad-spectrum antibiotics for treatment of neutropenic fever and sepsis and she was admitted to step down unit for further management.  Hospital Course:  Patient is a pleasant 80 year old female with a past medical history of stage II breast cancer, admitted to the medicine service on 12/08/2013 presenting with generalized weakness and palpitations. Patient found to be in A. fib with RVR, started on a Cardizem drip and admitted to the step down unit. She also reported having intermittent fevers and chills at home, started on broad-spectrum IV antibiotic therapy with Zosyn and Vancomycin for neutropenic fever. Blood cultures that were obtained on admission grew Pseudomonas aeruginosa, 1 out of 2. This organism was susceptible to cephalosporins and quinolones. During this hospitalization she had been treated with IV Zosyn, then  transitioned to Levaquin. With regard to her atrial fibrillation rapid ventricular response (new diagnosis) she was started on anticoagulation with Eliquis since she had a [place] Vasc score of 5. Cardiology was consulted given difficulties controlling heart rates. She was  treated with IV amiodarone which was later transitioned to oral amiodarone at 400 mg by mouth twice a day. Cardiology recommending amiodarone 400 mg by mouth twice a day times one week, followed by 200 mg by mouth twice a day x1 week then 200 mg by mouth daily. Patient showed gradual clinical improvement as she converted to sinus rhythm. Repeat blood cultures drawn on 12/10/2013 remained sterile. She worked well with physical therapy who recommended home health services. She was discharged in stable condition on 12/14/2013 to her home.  1. Neutropenia fever -Patient with history of breast cancer undergoing systemic therapy with cyclophosphamide and doxorubicin  -Reported fevers and chills at home  -Chest x-ray showed no acute abnormalities, urinalysis normal, nonfocal on physical exam  -Blood cultures are positive, one out of two, for Pseudomonas aeruginosa. Organism is pan susceptible.  -Repeat 2D echo did not show significant change from previous echo  -Repeat blood cultures remained negative.  -Transitioned to oral Levaquin, plan for 2 weeks of antimicrobial therapy.  2. Sepsis  -Present on admission, evidenced by positive blood cultures, HR's in the 130's, neutropenia, functional decline  -Blood cultures growing Pseudomonas aeruginosa, organism susceptible to levaquin  -CXR and U/A unremarkable, nonfocal on exam.  -Repeat blood cultures on 12/10/2013 negative to date  3. Atrial fibrillation rapid ventricular response, new diagnosis  -Patient without significant cardiac history, had reported palpitations over the past week found to be in A. fib with RVR  -Continue Cardizem drip, close monitoring in the step down unit  -Has history of hypothyroidism, TSH 0.551  -Transthoracic echocardiogram performed on 11/08/2013 which showed EF of 65-70% without wall motion abnormalities, grade 2 diastolic dysfunction  -I suspect underlying infectious process and perhaps dehydration may have precipitated A. fib  with RVR  -Has a CHADVASC score or 5, having a high risk for thromboembolism, risks and benefits of anticoagulation discussed with patient and daughter at bedside  -Sarted Eliquis 5 mg PO BID  -Patient converting to sinus rhythm, I discussed case with cardiology, changed to Amio 400 mg PO BID from IV gtt, continue Metoprolol 100 mg PO BID  4. Type 2 diabetes mellitus  -Perform Accu-Cheks q. a.c. and each bedtime  -On metformin therapy  5. Hypertension  -Patient with history of hypertension,  -Metoprolol increased to 100 mg daily  6. Hypothyroidism  -TSH within normal limits  -Continue Synthroid 50 mcg by mouth daily  7. Hypokalemia  -Replaced    she is here today for followup. First met the patient in May 2015. Patient had a mammogram performed in August which revealed breast cancer. She has undergone 2 rounds of chemotherapy to treat stage II breast cancer. Patient was admitted for neutropenic fever and was found to have Pseudomonas and is growing in one out of 2 blood cultures. She was treated with Levaquin. She is to complete 2 weeks of treatment. She also developed a fibrillation with rapid ventricular response. This is a new diagnosis. She was started on amiodarone in the hospital. She is on eliquis for stroke prevention.    Patient is doing very well since he left the hospital. Her blood pressure is excellent 124/72. Her heart rate is well controlled at 80 beats per minute. Today on examination she is in normal sinus  rhythm. Her lungs are clear there is no evidence of pulmonary edema. The patient does have +1 edema in both legs to the mid shin. However she received substantial IV fluids during her hospitalization which would explain this. She denies any fevers or chills or symptoms of illness. She denies any chest pain or shortness of breath. She is not sure how many antibiotic tablet she has at home. She is also not sure if she has a refill on eliquis or amiodarone. Past Medical History    Diagnosis Date  . Hypertension   . Hyperlipidemia   . Diabetes mellitus without complication   . Thyroid disease     hypothyroidism  . Anemia   . Former smoker   . Multinodular goiter   . Complication of anesthesia     slow to wake up  . Cancer     right breast   Past Surgical History  Procedure Laterality Date  . Eye surgery  12/22/2009  . Abdominal hysterectomy    . Portacath placement N/A 11/15/2013    Procedure: INSERTION PORT-A-CATH WITH ULTRA SOUND AND FLOROSCOPY;  Surgeon: [name], MD;  Location: [place];  Service: General;  Laterality: N/A;   Current Outpatient Prescriptions on File Prior to Visit  Medication Sig Dispense Refill  . amiodarone (PACERONE) 400 MG tablet Take 400 mg PO BID for 1 week followed by 200 mg PO q daily for 1 week, followed by 200 mg PO q daily thereafter. QTY sufficient  90 tablet  1  . apixaban (ELIQUIS) 5 MG TABS tablet Take 1 tablet (5 mg total) by mouth 2 (two) times daily.  60 tablet  1  . atorvastatin (LIPITOR) 10 MG tablet Take 10 mg by mouth daily.      . Cholecalciferol (VITAMIN D) 2000 UNITS CAPS Take 2,000 Units by mouth daily.      Marland Kitchen dexamethasone (DECADRON) 1 MG tablet Take 1 mg by mouth at bedtime as needed (take the night before infusion).      Marland Kitchen dexamethasone (DECADRON) 4 MG tablet Take 2 tablets by mouth once a day on the day after chemotherapy and then take 2 tablets two times a day for 2 days. Take with food.  30 tablet  1  . ferrous sulfate 325 (65 FE) MG EC tablet Take 325 mg by mouth daily with breakfast.      . furosemide (LASIX) 40 MG tablet Take 40 mg by mouth daily.       Marland Kitchen levofloxacin (LEVAQUIN) 500 MG tablet Take 1 tablet (500 mg total) by mouth daily.  8 tablet  0  . levothyroxine (SYNTHROID, LEVOTHROID) 50 MCG tablet Take 1 tablet (50 mcg total) by mouth daily before breakfast.  90 tablet  0  . lidocaine-prilocaine (EMLA) cream Apply 1 application topically as needed.  30 g  0  . LORazepam (ATIVAN) 0.5 MG tablet  Take 1 tablet (0.5 mg total) by mouth every 6 (six) hours as needed (Nausea or vomiting).  30 tablet  0  . metFORMIN (GLUCOPHAGE-XR) 500 MG 24 hr tablet Take 500-1,000 mg by mouth 2 (two) times daily. Take 500 mg every morning and 1000 mg every evening      . metoprolol (LOPRESSOR) 100 MG tablet Take 1 tablet (100 mg total) by mouth 2 (two) times daily.  60 tablet  1  . ondansetron (ZOFRAN) 8 MG tablet Take 1 tablet (8 mg total) by mouth 2 (two) times daily as needed. Start on the third day after chemotherapy.  30 tablet  1  . pioglitazone (ACTOS) 30 MG tablet Take 1 tablet (30 mg total) by mouth daily.  90 tablet  0  . Polyethyl Glycol-Propyl Glycol (SYSTANE OP) Place 1 drop into both eyes 3 (three) times daily.      . potassium chloride SA (K-DUR,KLOR-CON) 20 MEQ tablet Take 20 mEq by mouth daily.      . prochlorperazine (COMPAZINE) 10 MG tablet Take 1 tablet (10 mg total) by mouth every 6 (six) hours as needed (Nausea or vomiting).  30 tablet  1  . vitamin B-12 (CYANOCOBALAMIN) 1000 MCG tablet Take 1,000 mcg by mouth daily.       No current facility-administered medications on file prior to visit.   No Known Allergies History   Social History  . Marital Status: Widowed    Spouse Name: N/A    Number of Children: N/A  . Years of Education: N/A   Occupational History  . Not on file.   Social History Main Topics  . Smoking status: Former Smoker -- 1.00 packs/day for 5 years    Quit date: 11/14/1958  . Smokeless tobacco: Never Used  . Alcohol Use: No  . Drug Use: No  . Sexual Activity: No   Other Topics Concern  . Not on file   Social History Narrative  . No narrative on file      Review of Systems  All other systems reviewed and are negative.      Objective:   Physical Exam  Vitals reviewed. Constitutional: She appears well-developed and well-nourished. No distress.  HENT:  Nose: Nose normal.  Mouth/Throat: Oropharynx is clear and moist. No oropharyngeal exudate.   Eyes: Conjunctivae are normal.  Neck: Neck supple. No JVD present. No thyromegaly present.  Cardiovascular: Normal rate, regular rhythm and normal heart sounds.   No murmur heard. Pulmonary/Chest: Effort normal and breath sounds normal. No respiratory distress. She has no wheezes. She has no rales. She exhibits no tenderness.  Abdominal: Soft. Bowel sounds are normal. She exhibits no distension. There is no tenderness. There is no rebound.  Musculoskeletal: She exhibits edema.  Lymphadenopathy:    She has no cervical adenopathy.  Skin: No rash noted. She is not diaphoretic.          Assessment & Plan:  Hospital discharge follow-up - Plan: COMPLETE METABOLIC PANEL WITH GFR, CBC with Differential  Patient is currently in normal sinus rhythm. There is no evidence of a febrile illness. Her blood pressure and heart rate are well controlled. I will have the patient go home and bring back all her pill bottles. To make sure that she has sufficient refills of her eliquis and amiodarone.  I also will make sure that she completes a full 2 weeks of antibiotics to treat her bacteremia. Also the patient back in one week. At that point I will repeat blood cultures.  I will also give the patient her flu shot in one week she returns.

## 2013-12-22 ENCOUNTER — Other Ambulatory Visit (HOSPITAL_BASED_OUTPATIENT_CLINIC_OR_DEPARTMENT_OTHER): Payer: Medicare Other

## 2013-12-22 ENCOUNTER — Ambulatory Visit (HOSPITAL_BASED_OUTPATIENT_CLINIC_OR_DEPARTMENT_OTHER): Payer: Medicare Other | Admitting: Hematology and Oncology

## 2013-12-22 ENCOUNTER — Telehealth: Payer: Self-pay | Admitting: Hematology and Oncology

## 2013-12-22 VITALS — BP 115/52 | HR 80 | Temp 98.6°F | Resp 18 | Ht 67.0 in | Wt 194.4 lb

## 2013-12-22 DIAGNOSIS — C50411 Malignant neoplasm of upper-outer quadrant of right female breast: Secondary | ICD-10-CM

## 2013-12-22 DIAGNOSIS — I4891 Unspecified atrial fibrillation: Secondary | ICD-10-CM

## 2013-12-22 DIAGNOSIS — R591 Generalized enlarged lymph nodes: Secondary | ICD-10-CM

## 2013-12-22 LAB — COMPREHENSIVE METABOLIC PANEL (CC13)
ALT: 8 U/L (ref 0–55)
AST: 11 U/L (ref 5–34)
Albumin: 2.6 g/dL — ABNORMAL LOW (ref 3.5–5.0)
Alkaline Phosphatase: 66 U/L (ref 40–150)
Anion Gap: 11 mEq/L (ref 3–11)
BUN: 26 mg/dL (ref 7.0–26.0)
CO2: 30 mEq/L — ABNORMAL HIGH (ref 22–29)
Calcium: 10 mg/dL (ref 8.4–10.4)
Chloride: 100 mEq/L (ref 98–109)
Creatinine: 1.3 mg/dL — ABNORMAL HIGH (ref 0.6–1.1)
Glucose: 166 mg/dl — ABNORMAL HIGH (ref 70–140)
Potassium: 3.1 mEq/L — ABNORMAL LOW (ref 3.5–5.1)
Sodium: 141 mEq/L (ref 136–145)
Total Bilirubin: 0.29 mg/dL (ref 0.20–1.20)
Total Protein: 6.2 g/dL — ABNORMAL LOW (ref 6.4–8.3)

## 2013-12-22 LAB — CBC WITH DIFFERENTIAL/PLATELET
BASO%: 0.4 % (ref 0.0–2.0)
Basophils Absolute: 0 10*3/uL (ref 0.0–0.1)
EOS%: 0 % (ref 0.0–7.0)
Eosinophils Absolute: 0 10*3/uL (ref 0.0–0.5)
HCT: 29 % — ABNORMAL LOW (ref 34.8–46.6)
HGB: 9.3 g/dL — ABNORMAL LOW (ref 11.6–15.9)
LYMPH%: 4.1 % — ABNORMAL LOW (ref 14.0–49.7)
MCH: 27.6 pg (ref 25.1–34.0)
MCHC: 32.1 g/dL (ref 31.5–36.0)
MCV: 86 fL (ref 79.5–101.0)
MONO#: 1.4 10*3/uL — ABNORMAL HIGH (ref 0.1–0.9)
MONO%: 11.5 % (ref 0.0–14.0)
NEUT#: 10.4 10*3/uL — ABNORMAL HIGH (ref 1.5–6.5)
NEUT%: 84 % — ABNORMAL HIGH (ref 38.4–76.8)
Platelets: 573 10*3/uL — ABNORMAL HIGH (ref 145–400)
RBC: 3.38 10*6/uL — ABNORMAL LOW (ref 3.70–5.45)
RDW: 16.9 % — ABNORMAL HIGH (ref 11.2–14.5)
WBC: 12.3 10*3/uL — ABNORMAL HIGH (ref 3.9–10.3)
lymph#: 0.5 10*3/uL — ABNORMAL LOW (ref 0.9–3.3)

## 2013-12-22 NOTE — Assessment & Plan Note (Addendum)
Right breast invasive ductal carcinoma with enlarged right axillary lymph node: Patient was on neoadjuvant chemotherapy with dose dense Adriamycin Cytoxan. After cycle 2 she developed sepsis neutropenic fever and atrial fibrillation. She recovered from her hospitalization and is now discharged home. She is on amiodarone for atrial fibrillation and is currently in sinus rhythm.  I discussed with her that given this complication, I would not want to  continue current neoadjuvant treatment plan I would rather repeat another mammogram and ultrasound and have her see Dr. Brantley Stage for surgical options.   After surgery, she may be eligible for adjuvant antiestrogen therapy. Patient understands this and is agreeable with the treatment plan. I strongly recommended that she keep her appointments with her cardiologist to help manage her cardiac issues. I will see her back after surgery to discuss final pathology and adjuvant treatment options.

## 2013-12-22 NOTE — Telephone Encounter (Signed)
, °

## 2013-12-22 NOTE — Progress Notes (Signed)
Patient Care Team: [name], MD as PCP - General (Family Medicine) [name], MD as Consulting Physician (Hematology and Oncology) [name], MD as Consulting Physician (General Surgery) [name], MD as Consulting Physician (Radiation Oncology)  DIAGNOSIS: Breast cancer of upper-outer quadrant of right female breast   Primary site: Breast (Right)   Staging method: AJCC 7th Edition   Clinical: Stage IIB (T2, N1, cM0) signed by [name], MD on 11/01/2013  8:37 AM   Summary: Stage IIB (T2, N1, cM0)   SUMMARY OF ONCOLOGIC HISTORY:   Breast cancer of upper-outer quadrant of right female breast   10/20/2013 Mammogram Ultrasound and mammogram showed 2.1 x 2.1 x 1.9 cm right breast mass   10/20/2013 Initial Diagnosis Breast cancer of upper-outer quadrant of right female breast. Invasive ductal cancer with lymphovascular invasion one lymph node biopsy that was positive for cancer grade 1; Her 2 Neg Ratio 0.96, ER100% PR 8% positive Ki 67: 11%;   10/31/2013 Breast MRI Right breast upper outer quadrant: 3.1 x 1.9 x 2.2 cm: Right axillary lymph node 1.1 x 0.9 x 0.6 cm   11/16/2013 -  Chemotherapy Neoadjuvant dose dense Doxorubicin and Cyclophosphamide given on day 1 of a 14 day cycle with Neulasta given on day 2 for granulocyte support    CHIEF COMPLIANT: Leg edema  INTERVAL HISTORY: MERIAM CHOJNOWSKI is a 80 year old Caucasian with above-mentioned history of right breast cancer who was treated with neoadjuvant chemotherapy because she was an excellent performance status and based on the tumor size of her breast size we felt that she could benefit from tumor size reduction. After 2 cycles she was admitted to the hospital with neutropenic fever and sepsis and was diagnosed with atrial fibrillation for which she is on amiodarone which led to conversion to sinus rhythm. She reports that she is feeling a lot better in general terms. She is currently off antibiotics. Her major complaint  is leg edema. She's had some leg swelling prior to her chemotherapy but appears to have gotten worse. She is currently on Lasix now. She has appointment to see her cardiologist to discuss this issue further. Patient and her family are here today to discuss her treatment plan in terms of her breast cancer care.  REVIEW OF SYSTEMS:   Constitutional: Denies fevers, chills or abnormal weight loss Eyes: Denies blurriness of vision Ears, nose, mouth, throat, and face: Denies mucositis or sore throat Respiratory: Denies cough, dyspnea or wheezes Cardiovascular: Denies palpitation, chest discomfort. Bilateral leg edema Gastrointestinal:  Denies nausea, heartburn or change in bowel habits Skin: Denies abnormal skin rashes Lymphatics: Denies new lymphadenopathy or easy bruising Neurological:Denies numbness, tingling or new weaknesses Behavioral/Psych: Mood is stable, no new changes  Breast: Right breast lump is smaller All other systems were reviewed with the patient and are negative.  I have reviewed the past medical history, past surgical history, social history and family history with the patient and they are unchanged from previous note.  ALLERGIES:  has No Known Allergies.  MEDICATIONS:  Current Outpatient Prescriptions  Medication Sig Dispense Refill  . amiodarone (PACERONE) 400 MG tablet Take 400 mg PO BID for 1 week followed by 200 mg PO q daily for 1 week, followed by 200 mg PO q daily thereafter. QTY sufficient  90 tablet  1  . apixaban (ELIQUIS) 5 MG TABS tablet Take 1 tablet (5 mg total) by mouth 2 (two) times daily.  60 tablet  1  . atorvastatin (LIPITOR) 10  MG tablet Take 10 mg by mouth daily.      . Cholecalciferol (VITAMIN D) 2000 UNITS CAPS Take 2,000 Units by mouth daily.      Marland Kitchen dexamethasone (DECADRON) 1 MG tablet Take 1 mg by mouth at bedtime as needed (take the night before infusion).      Marland Kitchen dexamethasone (DECADRON) 4 MG tablet Take 2 tablets by mouth once a day on the day  after chemotherapy and then take 2 tablets two times a day for 2 days. Take with food.  30 tablet  1  . ferrous sulfate 325 (65 FE) MG EC tablet Take 325 mg by mouth daily with breakfast.      . furosemide (LASIX) 40 MG tablet Take 40 mg by mouth daily.       Marland Kitchen levothyroxine (SYNTHROID, LEVOTHROID) 50 MCG tablet Take 1 tablet (50 mcg total) by mouth daily before breakfast.  90 tablet  0  . lidocaine-prilocaine (EMLA) cream Apply 1 application topically as needed.  30 g  0  . LORazepam (ATIVAN) 0.5 MG tablet Take 1 tablet (0.5 mg total) by mouth every 6 (six) hours as needed (Nausea or vomiting).  30 tablet  0  . metFORMIN (GLUCOPHAGE-XR) 500 MG 24 hr tablet Take 500-1,000 mg by mouth 2 (two) times daily. Take 500 mg every morning and 1000 mg every evening      . metoprolol (LOPRESSOR) 100 MG tablet Take 1 tablet (100 mg total) by mouth 2 (two) times daily.  60 tablet  1  . ondansetron (ZOFRAN) 8 MG tablet Take 1 tablet (8 mg total) by mouth 2 (two) times daily as needed. Start on the third day after chemotherapy.  30 tablet  1  . pioglitazone (ACTOS) 30 MG tablet Take 1 tablet (30 mg total) by mouth daily.  90 tablet  0  . Polyethyl Glycol-Propyl Glycol (SYSTANE OP) Place 1 drop into both eyes 3 (three) times daily.      . potassium chloride SA (K-DUR,KLOR-CON) 20 MEQ tablet Take 20 mEq by mouth daily.      . prochlorperazine (COMPAZINE) 10 MG tablet Take 1 tablet (10 mg total) by mouth every 6 (six) hours as needed (Nausea or vomiting).  30 tablet  1  . vitamin B-12 (CYANOCOBALAMIN) 1000 MCG tablet Take 1,000 mcg by mouth daily.       No current facility-administered medications for this visit.    PHYSICAL EXAMINATION: ECOG PERFORMANCE STATUS: 1 - Symptomatic but completely ambulatory  Filed Vitals:   12/22/13 1016  BP: 115/52  Pulse: 80  Temp: 98.6 F (37 C)  Resp: 18   Filed Weights   12/22/13 1016  Weight: 194 lb 6.4 oz (88.179 kg)    GENERAL:alert, no distress and  comfortable SKIN: skin color, texture, turgor are normal, no rashes or significant lesions EYES: normal, Conjunctiva are pink and non-injected, sclera clear OROPHARYNX:no exudate, no erythema and lips, buccal mucosa, and tongue normal  NECK: supple, thyroid normal size, non-tender, without nodularity LYMPH:  no palpable lymphadenopathy in the cervical, axillary or inguinal LUNGS: clear to auscultation and percussion with normal breathing effort HEART: regular rate & rhythm and no murmurs and no lower extremity edema ABDOMEN:abdomen soft, non-tender and normal bowel sounds Musculoskeletal:no cyanosis of digits and no clubbing  NEURO: alert & oriented x 3 with fluent speech, no focal motor/sensory deficits BREAST: Right breast mass is significantly shrunk in size no palpable lymphadenopathy in axilla. No palpable axillary supraclavicular or infraclavicular adenopathy no breast tenderness or  nipple discharge.   LABORATORY DATA:  I have reviewed the data as listed   Chemistry      Component Value Date/Time   NA 141 12/22/2013 1000   NA 140 12/21/2013 1234   K 3.1* 12/22/2013 1000   K 3.1* 12/21/2013 1234   CL 98 12/21/2013 1234   CO2 30* 12/22/2013 1000   CO2 29 12/21/2013 1234   BUN 26.0 12/22/2013 1000   BUN 22 12/21/2013 1234   CREATININE 1.3* 12/22/2013 1000   CREATININE 1.24* 12/21/2013 1234   CREATININE 0.67 12/13/2013 0457      Component Value Date/Time   CALCIUM 10.0 12/22/2013 1000   CALCIUM 9.8 12/21/2013 1234   ALKPHOS 66 12/22/2013 1000   ALKPHOS 60 12/21/2013 1234   AST 11 12/22/2013 1000   AST 11 12/21/2013 1234   ALT 8 12/22/2013 1000   ALT <8 12/21/2013 1234   BILITOT 0.29 12/22/2013 1000   BILITOT 0.4 12/21/2013 1234       Lab Results  Component Value Date   WBC 12.3* 12/22/2013   HGB 9.3* 12/22/2013   HCT 29.0* 12/22/2013   MCV 86.0 12/22/2013   PLT 573* 12/22/2013   NEUTROABS 10.4* 12/22/2013     RADIOGRAPHIC STUDIES: No results found.   ASSESSMENT & PLAN:  Breast  cancer of upper-outer quadrant of right female breast Right breast invasive ductal carcinoma with enlarged right axillary lymph node: Patient was on neoadjuvant chemotherapy with dose dense Adriamycin Cytoxan. After cycle 2 she developed sepsis neutropenic fever and atrial fibrillation. She recovered from her hospitalization and is now discharged home. She is on amiodarone for atrial fibrillation and is currently in sinus rhythm.  I discussed with her that given this complication, I would not want to  continue current neoadjuvant treatment plan I would rather repeat another mammogram and ultrasound and have her see Dr. Brantley Stage for surgical options.   After surgery, she may be eligible for adjuvant antiestrogen therapy. Patient understands this and is agreeable with the treatment plan. I strongly recommended that she keep her appointments with her cardiologist to help manage her cardiac issues. I will see her back after surgery to discuss final pathology and adjuvant treatment options.   Orders Placed This Encounter  Procedures  . MM Digital Diagnostic Unilat R    Standing Status: Future     Number of Occurrences:      Standing Expiration Date: 12/22/2014    Order Specific Question:  Reason for Exam (SYMPTOM  OR DIAGNOSIS REQUIRED)    Answer:  On neoadjuvant chemo, couldnt tol chemo. Restaging prior to surgery     Comments:  DO Ultrasound as needed    Order Specific Question:  Preferred imaging location?    Answer:  [facility]   The patient has a good understanding of the overall plan. she agrees with it. She will call with any problems that may develop before her next visit here.  I spent 20 minutes counseling the patient face to face. The total time spent in the appointment was 25 minutes and more than 50% was on counseling and review of test results    [name], MD 12/22/2013 2:06 PM

## 2013-12-25 ENCOUNTER — Telehealth: Payer: Self-pay

## 2013-12-25 NOTE — Telephone Encounter (Signed)
Fax sent to [facility] re: chemo dc'd for pt - pt needs to see Dr Brantley Stage.  Sent to scan.

## 2013-12-28 ENCOUNTER — Other Ambulatory Visit: Payer: Medicare Other

## 2013-12-28 ENCOUNTER — Ambulatory Visit: Payer: Medicare Other

## 2013-12-28 ENCOUNTER — Ambulatory Visit: Payer: Medicare Other | Admitting: Hematology and Oncology

## 2013-12-28 ENCOUNTER — Ambulatory Visit: Payer: Medicare Other | Admitting: Family Medicine

## 2013-12-29 ENCOUNTER — Ambulatory Visit: Payer: Medicare Other

## 2014-01-01 ENCOUNTER — Ambulatory Visit (INDEPENDENT_AMBULATORY_CARE_PROVIDER_SITE_OTHER): Payer: Medicare Other | Admitting: Family Medicine

## 2014-01-01 ENCOUNTER — Encounter: Payer: Self-pay | Admitting: Family Medicine

## 2014-01-01 VITALS — BP 104/62 | HR 74 | Temp 98.8°F | Resp 18 | Ht 67.0 in | Wt 194.0 lb

## 2014-01-01 DIAGNOSIS — Z23 Encounter for immunization: Secondary | ICD-10-CM

## 2014-01-01 DIAGNOSIS — R7881 Bacteremia: Secondary | ICD-10-CM

## 2014-01-01 NOTE — Progress Notes (Signed)
Subjective:    Patient ID: Whitney [name], female    DOB: Nov 19, 1927, 80 y.o.   MRN: [id number]  HPI   Patient was recently admitted to hospital with neutropenic fever. I have copied discharge summary below for my reference:  Admit date: 12/08/2013  Discharge date: 12/14/2013  Time spent: 35 minutes  Recommendations for Outpatient Follow-up:  1. Please follow up on BMP and CBC in 1 week 2. Please follow up on her Blood Pressures, her metoprolol was increased to 100 mg PO BID 3. She was set up with [facility] RN and PT prior to discharge Discharge Diagnoses:  Principal Problem:  Neutropenic fever  Active Problems:  Type II or unspecified type diabetes mellitus without mention of complication, not stated as uncontrolled  Essential hypertension, benign  Pure hypercholesterolemia  Breast cancer of upper-outer quadrant of right female breast  Anemia of chronic disease  Thrombocytopenia  Hypokalemia  Pancytopenia due to antineoplastic chemotherapy  New onset atrial fibrillation  Hypothyroidism  Chronic diastolic CHF (congestive heart failure), NYHA class 2  Atrial fibrillation with RVR  Preoperative cardiovascular examination  Discharge Condition: Stable  Diet recommendation: Heart Healthy  Filed Weights    12/10/13 0459  12/12/13 0200  12/14/13 0625   Weight:  88.5 kg (195 lb 1.7 oz)  89.3 kg (196 lb 13.9 oz)  89.4 kg (197 lb 1.5 oz)   History of present illness:  80 year old female with past medical history of stage 2 breast cancer of the right upper quadrant of the breast (initially diagnosed in 10/2013, invasive ductal carcinoma), started chemotherapy with cyclophosphamide and doxorubicin on 11/16/2013 and second dose given 11/30/2013 with neulasta support who presented to [facility] ED 12/08/2013 not feeling well. Patient is a 42 she felt weak and fatigued for past few days. She had a fever at home and to one dose of Tylenol but did not feel significant improvement. She  reported some nausea but no vomiting. No diarrhea. No chest pain, shortness of breath or palpitations. No complaints of abdominal pain. No lightheadedness or loss of consciousness. No cough. No reports of blood in the stool or urine.  In ED, blood pressure is 90/51 and subsequently 170/152, HR 45-133, RR 10-29, T max 100.4 F and oxygen saturation 93% on 2 L nasal cannula oxygen support. Blood work revealed white blood cell count of 0.3, hemoglobin 9.5 and platelets 106. Potassium was 3.2 and supplemented in ED. The initial troponin level was within normal limits. 12-lead EKG showed atrial fibrillation. Patient received 5 mg IV Cardizem and her heart rate was subsequently 101. She could not be started on Cardizem drip due to soft BP. Chest x-ray showed no acute abnormalities. Urinalysis showed trace leukocytes. Patient was started on broad-spectrum antibiotics for treatment of neutropenic fever and sepsis and she was admitted to step down unit for further management.  Hospital Course:  Patient is a pleasant 80 year old female with a past medical history of stage II breast cancer, admitted to the medicine service on 12/08/2013 presenting with generalized weakness and palpitations. Patient found to be in A. fib with RVR, started on a Cardizem drip and admitted to the step down unit. She also reported having intermittent fevers and chills at home, started on broad-spectrum IV antibiotic therapy with Zosyn and Vancomycin for neutropenic fever. Blood cultures that were obtained on admission grew Pseudomonas aeruginosa, 1 out of 2. This organism was susceptible to cephalosporins and quinolones. During this hospitalization she had been treated with IV Zosyn,  then transitioned to Levaquin. With regard to her atrial fibrillation rapid ventricular response (new diagnosis) she was started on anticoagulation with Eliquis since she had a [place] Vasc score of 5. Cardiology was consulted given difficulties controlling heart rates.  She was treated with IV amiodarone which was later transitioned to oral amiodarone at 400 mg by mouth twice a day. Cardiology recommending amiodarone 400 mg by mouth twice a day times one week, followed by 200 mg by mouth twice a day x1 week then 200 mg by mouth daily. Patient showed gradual clinical improvement as she converted to sinus rhythm. Repeat blood cultures drawn on 12/10/2013 remained sterile. She worked well with physical therapy who recommended home health services. She was discharged in stable condition on 12/14/2013 to her home.  1. Neutropenia fever -Patient with history of breast cancer undergoing systemic therapy with cyclophosphamide and doxorubicin  -Reported fevers and chills at home  -Chest x-ray showed no acute abnormalities, urinalysis normal, nonfocal on physical exam  -Blood cultures are positive, one out of two, for Pseudomonas aeruginosa. Organism is pan susceptible.  -Repeat 2D echo did not show significant change from previous echo  -Repeat blood cultures remained negative.  -Transitioned to oral Levaquin, plan for 2 weeks of antimicrobial therapy.  2. Sepsis  -Present on admission, evidenced by positive blood cultures, HR's in the 130's, neutropenia, functional decline  -Blood cultures growing Pseudomonas aeruginosa, organism susceptible to levaquin  -CXR and U/A unremarkable, nonfocal on exam.  -Repeat blood cultures on 12/10/2013 negative to date  3. Atrial fibrillation rapid ventricular response, new diagnosis  -Patient without significant cardiac history, had reported palpitations over the past week found to be in A. fib with RVR  -Continue Cardizem drip, close monitoring in the step down unit  -Has history of hypothyroidism, TSH 0.551  -Transthoracic echocardiogram performed on 11/08/2013 which showed EF of 65-70% without wall motion abnormalities, grade 2 diastolic dysfunction  -I suspect underlying infectious process and perhaps dehydration may have  precipitated A. fib with RVR  -Has a CHADVASC score or 5, having a high risk for thromboembolism, risks and benefits of anticoagulation discussed with patient and daughter at bedside  -Sarted Eliquis 5 mg PO BID  -Patient converting to sinus rhythm, I discussed case with cardiology, changed to Amio 400 mg PO BID from IV gtt, continue Metoprolol 100 mg PO BID  4. Type 2 diabetes mellitus  -Perform Accu-Cheks q. a.c. and each bedtime  -On metformin therapy  5. Hypertension  -Patient with history of hypertension,  -Metoprolol increased to 100 mg daily  6. Hypothyroidism  -TSH within normal limits  -Continue Synthroid 50 mcg by mouth daily  7. Hypokalemia  -Replaced    she is here today for followup. First met the patient in May 2015. Patient had a mammogram performed in August which revealed breast cancer. She has undergone 2 rounds of chemotherapy to treat stage II breast cancer. Patient was admitted for neutropenic fever and was found to have Pseudomonas and is growing in one out of 2 blood cultures. She was treated with Levaquin. She is to complete 2 weeks of treatment. She also developed a fibrillation with rapid ventricular response. This is a new diagnosis. She was started on amiodarone in the hospital. She is on eliquis for stroke prevention.   12/21/13 Patient is doing very well since he left the hospital. Her blood pressure is excellent 124/72. Her heart rate is well controlled at 80 beats per minute. Today on examination she is in normal  sinus rhythm. Her lungs are clear there is no evidence of pulmonary edema. The patient does have +1 edema in both legs to the mid shin. However she received substantial IV fluids during her hospitalization which would explain this. She denies any fevers or chills or symptoms of illness. She denies any chest pain or shortness of breath. She is not sure how many antibiotic tablet she has at home. She is also not sure if she has a refill on eliquis or  amiodarone.  At that time, my plan was: Patient is currently in normal sinus rhythm. There is no evidence of a febrile illness. Her blood pressure and heart rate are well controlled. I will have the patient go home and bring back all her pill bottles. To make sure that she has sufficient refills of her eliquis and amiodarone.  I also will make sure that she completes a full 2 weeks of antibiotics to treat her bacteremia. Also the patient back in one week. At that point I will repeat blood cultures.  I will also give the patient her flu shot in one week she returns.  01/01/14 Patient returns today for recheck. She's finished her antibiotics from rather weak. She remains afebrile. She denies any fevers chills nausea vomiting or diarrhea. She remains in normal sinus rhythm. She denies any chest pain shortness of breath or dyspnea on exertion. At her last office visit, I discovered that the patient was hypokalemic. She has been taking an extra dose of potassium every day the last 5 days. She did recheck her potassium today. Past Medical History  Diagnosis Date  . Hypertension   . Hyperlipidemia   . Diabetes mellitus without complication   . Thyroid disease     hypothyroidism  . Anemia   . Former smoker   . Multinodular goiter   . Complication of anesthesia     slow to wake up  . Cancer     right breast   Past Surgical History  Procedure Laterality Date  . Eye surgery  12/22/2009  . Abdominal hysterectomy    . Portacath placement N/A 11/15/2013    Procedure: INSERTION PORT-A-CATH WITH ULTRA SOUND AND FLOROSCOPY;  Surgeon: [name], MD;  Location: [facility];  Service: General;  Laterality: N/A;   Current Outpatient Prescriptions on File Prior to Visit  Medication Sig Dispense Refill  . amiodarone (PACERONE) 400 MG tablet Take 400 mg PO BID for 1 week followed by 200 mg PO q daily for 1 week, followed by 200 mg PO q daily thereafter. QTY sufficient  90 tablet  1  . apixaban (ELIQUIS) 5 MG TABS  tablet Take 1 tablet (5 mg total) by mouth 2 (two) times daily.  60 tablet  1  . atorvastatin (LIPITOR) 10 MG tablet Take 10 mg by mouth daily.      . Cholecalciferol (VITAMIN D) 2000 UNITS CAPS Take 2,000 Units by mouth daily.      Marland Kitchen dexamethasone (DECADRON) 1 MG tablet Take 1 mg by mouth at bedtime as needed (take the night before infusion).      Marland Kitchen dexamethasone (DECADRON) 4 MG tablet Take 2 tablets by mouth once a day on the day after chemotherapy and then take 2 tablets two times a day for 2 days. Take with food.  30 tablet  1  . ferrous sulfate 325 (65 FE) MG EC tablet Take 325 mg by mouth daily with breakfast.      . furosemide (LASIX) 40 MG tablet Take 40  mg by mouth daily.       Marland Kitchen levothyroxine (SYNTHROID, LEVOTHROID) 50 MCG tablet Take 1 tablet (50 mcg total) by mouth daily before breakfast.  90 tablet  0  . lidocaine-prilocaine (EMLA) cream Apply 1 application topically as needed.  30 g  0  . LORazepam (ATIVAN) 0.5 MG tablet Take 1 tablet (0.5 mg total) by mouth every 6 (six) hours as needed (Nausea or vomiting).  30 tablet  0  . metFORMIN (GLUCOPHAGE-XR) 500 MG 24 hr tablet Take 500-1,000 mg by mouth 2 (two) times daily. Take 500 mg every morning and 1000 mg every evening      . metoprolol (LOPRESSOR) 100 MG tablet Take 1 tablet (100 mg total) by mouth 2 (two) times daily.  60 tablet  1  . ondansetron (ZOFRAN) 8 MG tablet Take 1 tablet (8 mg total) by mouth 2 (two) times daily as needed. Start on the third day after chemotherapy.  30 tablet  1  . pioglitazone (ACTOS) 30 MG tablet Take 1 tablet (30 mg total) by mouth daily.  90 tablet  0  . Polyethyl Glycol-Propyl Glycol (SYSTANE OP) Place 1 drop into both eyes 3 (three) times daily.      . potassium chloride SA (K-DUR,KLOR-CON) 20 MEQ tablet Take 20 mEq by mouth daily.      . prochlorperazine (COMPAZINE) 10 MG tablet Take 1 tablet (10 mg total) by mouth every 6 (six) hours as needed (Nausea or vomiting).  30 tablet  1  . vitamin B-12  (CYANOCOBALAMIN) 1000 MCG tablet Take 1,000 mcg by mouth daily.       No current facility-administered medications on file prior to visit.   No Known Allergies History   Social History  . Marital Status: Widowed    Spouse Name: N/A    Number of Children: N/A  . Years of Education: N/A   Occupational History  . Not on file.   Social History Main Topics  . Smoking status: Former Smoker -- 1.00 packs/day for 5 years    Quit date: 11/14/1958  . Smokeless tobacco: Never Used  . Alcohol Use: No  . Drug Use: No  . Sexual Activity: No   Other Topics Concern  . Not on file   Social History Narrative  . No narrative on file      Review of Systems  All other systems reviewed and are negative.      Objective:   Physical Exam  Vitals reviewed. Constitutional: She appears well-developed and well-nourished. No distress.  HENT:  Nose: Nose normal.  Mouth/Throat: Oropharynx is clear and moist. No oropharyngeal exudate.  Eyes: Conjunctivae are normal.  Neck: Neck supple. No JVD present. No thyromegaly present.  Cardiovascular: Normal rate, regular rhythm and normal heart sounds.   No murmur heard. Pulmonary/Chest: Effort normal and breath sounds normal. No respiratory distress. She has no wheezes. She has no rales. She exhibits no tenderness.  Abdominal: Soft. Bowel sounds are normal. She exhibits no distension. There is no tenderness. There is no rebound.  Musculoskeletal: She exhibits edema.  Lymphadenopathy:    She has no cervical adenopathy.  Skin: No rash noted. She is not diaphoretic.          Assessment & Plan:  Bacteremia - Plan: Culture, blood (routine x 2), Basic metabolic panel  Repeat blood cultures x2 to ensure the bacteremia has resolved. Also repeat a BMP to make sure her hypokalemia has been corrected if not, she will require a chronic change her  potassium dose. Patient also received a flu shot today.

## 2014-01-02 ENCOUNTER — Ambulatory Visit: Payer: Medicare Other | Admitting: Physician Assistant

## 2014-01-02 ENCOUNTER — Ambulatory Visit (INDEPENDENT_AMBULATORY_CARE_PROVIDER_SITE_OTHER): Payer: Self-pay | Admitting: Surgery

## 2014-01-02 LAB — BASIC METABOLIC PANEL
BUN: 17 mg/dL (ref 6–23)
CO2: 30 mEq/L (ref 19–32)
Calcium: 9.8 mg/dL (ref 8.4–10.5)
Chloride: 101 mEq/L (ref 96–112)
Creat: 0.87 mg/dL (ref 0.50–1.10)
Glucose, Bld: 120 mg/dL — ABNORMAL HIGH (ref 70–99)
Potassium: 4.6 mEq/L (ref 3.5–5.3)
Sodium: 144 mEq/L (ref 135–145)

## 2014-01-02 NOTE — H&P (Signed)
Whitney [name] 01/02/2014 9:07 AM Location: [name] Patient #: [phone] DOB: [birth year] Widowed / Language: Whitney [name] / Race: Black or African American Female History of Present Illness Whitney [name] A. Whitney Hakes MD; 01/02/2014 11:56 AM) Patient words: breast f/u  pt returns to clinic after being on chemotherapy for breast cancer. She was placed on neoadjuvant chemotherapy and has had a fib and neutropenic fever and this has been stopped. She is on Eloquis and amiodarone. Her origina tumor 3.1 cm right breast UOQ with positive level one node by bx. She is here to discuss surgery. She feels much bettr. She is in SR.  The patient is a 80 year old female   Other Problems Whitney [name], [name]; 01/02/2014 9:10 AM) Breast Cancer High blood pressure Hypercholesterolemia Thyroid Disease  Past Surgical History Whitney [name], Whitney [name]; 01/02/2014 9:10 AM) Knee Surgery Left. Resection of Stomach  Diagnostic Studies History Whitney [name], Whitney [name]; 01/02/2014 9:10 AM) Colonoscopy never Mammogram within last year  Allergies (Whitney [name], [place]; 01/02/2014 9:10 AM) No Known Drug Allergies 01/02/2014  Medication History (Whitney [name], CMA; 01/02/2014 9:10 AM) Amiodarone HCl (200MG  Tablet, Oral daily) Active. Atorvastatin Calcium (10MG  Tablet, Oral daily) Active. AmLODIPine Besylate (5MG  Tablet, Oral daily) Active. Eliquis (5MG  Tablet, Oral daily) Active. Furosemide (40MG  Tablet, Oral daily) Active. Klor-Con M20 Whitney [name] Tablet ER, Oral daily) Active. MetFORMIN HCl ER (500MG  Tablet ER 24HR, Oral daily) Active. Metoprolol Succinate ER (25MG  Tablet ER 24HR, Oral daily) Active. Synthroid (50MCG Tablet, Oral daily) Active. Pioglitazone HCl (30MG  Tablet, Oral daily) Active. Lidocaine-Prilocaine (2.5-2.5% Cream, External as needed) Active. Dexamethasone (4MG  Tablet, Oral daily) Active. Lisinopril-Hydrochlorothiazide (20-12.5MG  Tablet, Oral daily) Active.  Social History (Whitney [name]; 01/02/2014 9:10 AM) Caffeine use Carbonated beverages, Coffee. No drug use Tobacco use Former smoker.  Family History Whitney [name], CMA; 01/02/2014 9:10 AM) Family history unknown First Degree Relatives  Pregnancy / Birth History Whitney [name], CMA; 01/02/2014 9:10 AM) Age at menarche 54 years. Age of menopause <47 Gravida 4 Irregular periods Maternal age 64-20 Para 4     Review of Systems (Whitney [name]; 01/02/2014 9:10 AM) General Present- Weight Loss. Not Present- Appetite Loss, Chills, Fatigue, Fever, Night Sweats and Weight Gain. Skin Not Present- Change in Wart/Mole, Dryness, Hives, Jaundice, New Lesions, Non-Healing Wounds, Rash and Ulcer. HEENT Present- Wears glasses/contact lenses. Not Present- Earache, Hearing Loss, Hoarseness, Nose Bleed, Oral Ulcers, Ringing in the Ears, Seasonal Allergies, Sinus Pain, Sore Throat, Visual Disturbances and Yellow Eyes. Breast Present- Breast Mass. Not Present- Breast Pain, Nipple Discharge and Skin Changes. Cardiovascular Not Present- Chest Pain, Difficulty Breathing Lying Down, Leg Cramps, Palpitations, Rapid Heart Rate, Shortness of Breath and Swelling of Extremities. Gastrointestinal Not Present- Abdominal Pain, Bloating, Bloody Stool, Change in Bowel Habits, Chronic diarrhea, Constipation, Difficulty Swallowing, Excessive gas, Gets full quickly at meals, Hemorrhoids, Indigestion, Nausea, Rectal Pain and Vomiting. Female Genitourinary Not Present- Frequency, Nocturia, Painful Urination, Pelvic Pain and Urgency. Musculoskeletal Not Present- Back Pain, Joint Pain, Joint Stiffness, Muscle Pain, Muscle Weakness and Swelling of Extremities. Hematology Present- Easy Bruising. Not Present- Excessive bleeding, Gland problems, HIV and Persistent Infections.  Vitals (Whitney [name] CMA; 01/02/2014 9:12 AM) 01/02/2014 9:11 AM Weight: 195 lb Height: 71in Body Surface Area: 2.1 m Body Mass Index: 27.2 kg/m Temp.:  46F(Temporal)  Pulse: 77 (Regular)  BP: 130/70 (Sitting, Left Arm, Standard)     Physical Exam (Whitney [name] A. Whitney Rafferty MD; 01/02/2014 11:57 AM)  General Mental Status-Alert. General Appearance-Consistent with stated age. Hydration-Well hydrated. Voice-Normal.  Head and Neck  Head-normocephalic, atraumatic with no lesions or palpable masses.  Eye Eyeball - Bilateral-Extraocular movements intact. Sclera/Conjunctiva - Bilateral-No scleral icterus.  Chest and Lung Exam Chest and lung exam reveals -quiet, even and easy respiratory effort with no use of accessory muscles and on auscultation, normal breath sounds, no adventitious sounds and normal vocal resonance. Inspection Chest Wall - Normal. Back - normal.  Breast Note: 3 CM RIGHT BREAST MASS MOBILE UOQ NON TENDER.   Cardiovascular Cardiovascular examination reveals -on palpation PMI is normal in location and amplitude, no palpable S3 or S4. Normal cardiac borders., normal heart sounds, regular rate and rhythm with no murmurs, carotid auscultation reveals no bruits and normal pedal pulses bilaterally.  Neurologic Neurologic evaluation reveals -alert and oriented x 3 with no impairment of recent or remote memory. Mental Status-Normal.  Musculoskeletal Normal Exam - Left-Upper Extremity Strength Normal and Lower Extremity Strength Normal. Normal Exam - Right-Upper Extremity Strength Normal, Lower Extremity Weakness.  Lymphatic Axillary  General Axillary Region: Right - Description - Localized lymphadenopathy. Size - 2.0 cm. Consistency - Firm. Mobility - Mobile. Tenderness - Non Tender.    Assessment & Plan (Whitney [name] A. Whitney Moeser MD; 01/02/2014 12:05 PM)  BREAST CANCER, RIGHT (174.9  C50.911) Impression: Risk of RIGHT SEED lumpectomy include bleeding, infection, seroma, more surgery, use of seed/wire, wound care, cosmetic deformity and the need for other treatments, death , blood clots, death. Pt  agrees to proceed. Risk of sentinel lymph node mapping include bleeding, infection, lymphedema, shoulder pain. stiffness, dye allergy. cosmetic deformity , blood clots, death, need for more surgery. PORT REMOVAL Pt agres to proceed.  Current Plans Schedule for Surgery Advised patient to stop ASA, anticoagulant, blood thinners, and NSAIDs five (5) days prior to surgery. PORT CATHETER IN PLACE (V45.89  [phone]) Impression: WILL REMOVE AT THIS POINT SINCE NO LONGER CHEMOTHERAPY CANDIDATE.  ATRIAL FIBRILLATION (427.31  I48.91) Impression: HOLD ELOQUIS FOR 5 DAYS PRIOR TO SURGERY

## 2014-01-03 ENCOUNTER — Telehealth (INDEPENDENT_AMBULATORY_CARE_PROVIDER_SITE_OTHER): Payer: Self-pay | Admitting: General Surgery

## 2014-01-03 ENCOUNTER — Encounter (HOSPITAL_BASED_OUTPATIENT_CLINIC_OR_DEPARTMENT_OTHER): Payer: Self-pay | Admitting: *Deleted

## 2014-01-03 ENCOUNTER — Ambulatory Visit (INDEPENDENT_AMBULATORY_CARE_PROVIDER_SITE_OTHER): Payer: Self-pay | Admitting: Surgery

## 2014-01-03 DIAGNOSIS — C50911 Malignant neoplasm of unspecified site of right female breast: Secondary | ICD-10-CM

## 2014-01-03 NOTE — Progress Notes (Signed)
Pt was in hospital 12/21/13-has had several labs done due to infection and abn electrolytes. Doing better-for her lumpectomy-snbx/or dissection-awaiting when seeds to be done -granddaughter lives with her and goes to dr visits-she will bring her in to reck labs-to bring all meds, overnight bag dos

## 2014-01-03 NOTE — Telephone Encounter (Signed)
Edwena Felty, the [facility] nurse is calling to get clarification on the patient's orders for surgery.  Dr. Josetta Huddle notes explains he is going to complete a mapping along with the Right breast seed localized lumpectomy on 01/09/14; however his orders say he is doing a node dissection.  We need a clarification on what he would like to do for this patient.  If he does do the mapping, they need an order for Nuc med, and if he wants to do a dissection then she will need orders for bed placement.  Informed Edwena Felty that I would send this message to Dr. Brantley Stage and we would give her a call back once we receive an answer from him.

## 2014-01-04 ENCOUNTER — Other Ambulatory Visit: Payer: Self-pay | Admitting: Hematology and Oncology

## 2014-01-04 ENCOUNTER — Other Ambulatory Visit (INDEPENDENT_AMBULATORY_CARE_PROVIDER_SITE_OTHER): Payer: Self-pay | Admitting: Surgery

## 2014-01-04 DIAGNOSIS — C50411 Malignant neoplasm of upper-outer quadrant of right female breast: Secondary | ICD-10-CM

## 2014-01-04 DIAGNOSIS — C50911 Malignant neoplasm of unspecified site of right female breast: Secondary | ICD-10-CM

## 2014-01-04 NOTE — Telephone Encounter (Signed)
Third set of orders in place

## 2014-01-04 NOTE — Telephone Encounter (Signed)
Dissection   No mapping she has a positive node

## 2014-01-04 NOTE — H&P (Signed)
Whitney [name]  01/02/2014 9:07 AM  Location: [facility]  Patient #: [id number]  DOB: 1927/07/04  Widowed / Language: [name] / Race: Black or African American  Female  History of Present Illness Marcello Moores A. Zackery Brine MD; 01/02/2014 11:56 AM)  Patient words: breast f/u  pt returns to clinic after being on chemotherapy for breast cancer. She was placed on neoadjuvant chemotherapy and has had a fib and neutropenic fever and this has been stopped. She is on Eloquis and amiodarone. Her origina tumor 3.1 cm right breast UOQ with positive level one node by bx. She is here to discuss surgery. She feels much bettr. She is in SR.  The patient is a 80 year old female  Other Problems [name], [place]; 01/02/2014 9:10 AM)  Breast Cancer  High blood pressure  Hypercholesterolemia  Thyroid Disease  Past Surgical History [name], [place]; 01/02/2014 9:10 AM)  Knee Surgery Left.  Resection of Stomach  Diagnostic Studies History [name], [place]; 01/02/2014 9:10 AM)  Colonoscopy never  Mammogram within last year  Allergies ([place], [place]; 01/02/2014 9:10 AM)  No Known Drug Allergies 01/02/2014  Medication History ([name], CMA; 01/02/2014 9:10 AM)  Amiodarone HCl (200MG  Tablet, Oral daily) Active.  Atorvastatin Calcium (10MG  Tablet, Oral daily) Active.  AmLODIPine Besylate (5MG  Tablet, Oral daily) Active.  Eliquis (5MG  Tablet, Oral daily) Active.  Furosemide (40MG  Tablet, Oral daily) Active.  Klor-Con M20 [facility] Tablet ER, Oral daily) Active.  MetFORMIN HCl ER (500MG  Tablet ER 24HR, Oral daily) Active.  Metoprolol Succinate ER (25MG  Tablet ER 24HR, Oral daily) Active.  Synthroid (50MCG Tablet, Oral daily) Active.  Pioglitazone HCl (30MG  Tablet, Oral daily) Active.  Lidocaine-Prilocaine (2.5-2.5% Cream, External as needed) Active.  Dexamethasone (4MG  Tablet, Oral daily) Active.  Lisinopril-Hydrochlorothiazide (20-12.5MG  Tablet, Oral daily) Active.  Social History ([place]; 01/02/2014 9:10 AM)  Caffeine use Carbonated beverages, Coffee.  No drug use  Tobacco use Former smoker.  Family History [name], CMA; 01/02/2014 9:10 AM)  Family history unknown  First Degree Relatives  Pregnancy / Birth History [name], CMA; 01/02/2014 9:10 AM)  Age at menarche 63 years.  Age of menopause <47  Gravida 4  Irregular periods  Maternal age 12-20  Para 4  Review of Systems ([place]; 01/02/2014 9:10 AM)  General Present- Weight Loss. Not Present- Appetite Loss, Chills, Fatigue, Fever, Night Sweats and Weight Gain.  Skin Not Present- Change in Wart/Mole, Dryness, Hives, Jaundice, New Lesions, Non-Healing Wounds, Rash and Ulcer.  HEENT Present- Wears glasses/contact lenses. Not Present- Earache, Hearing Loss, Hoarseness, Nose Bleed, Oral Ulcers, Ringing in the Ears, Seasonal Allergies, Sinus Pain, Sore Throat, Visual Disturbances and Yellow Eyes.  Breast Present- Breast Mass. Not Present- Breast Pain, Nipple Discharge and Skin Changes.  Cardiovascular Not Present- Chest Pain, Difficulty Breathing Lying Down, Leg Cramps, Palpitations, Rapid Heart Rate, Shortness of Breath and Swelling of Extremities.  Gastrointestinal Not Present- Abdominal Pain, Bloating, Bloody Stool, Change in Bowel Habits, Chronic diarrhea, Constipation, Difficulty Swallowing, Excessive gas, Gets full quickly at meals, Hemorrhoids, Indigestion, Nausea, Rectal Pain and Vomiting.  Female Genitourinary Not Present- Frequency, Nocturia, Painful Urination, Pelvic Pain and Urgency.  Musculoskeletal Not Present- Back Pain, Joint Pain, Joint Stiffness, Muscle Pain, Muscle Weakness and Swelling of Extremities.  Hematology Present- Easy Bruising. Not Present- Excessive bleeding, Gland problems, HIV and Persistent Infections.  Vitals ([name] CMA; 01/02/2014 9:12 AM)  01/02/2014 9:11 AM  Weight: 195 lb Height: 71 in  Body Surface Area:  2.1 m Body Mass Index: 27.2 kg/m  Temp.: 97 F  (Temporal) Pulse: 77 (Regular)  BP: 130/70 (Sitting, Left Arm, Standard)  Physical Exam (Dameka Younker A. Alijah Akram MD; 01/02/2014 11:57 AM)  General  Mental Status - Alert.  General Appearance - Consistent with stated age.  Hydration - Well hydrated.  Voice - Normal.  Head and Neck  Head - normocephalic, atraumatic with no lesions or palpable masses.  Eye  Eyeball - Bilateral - Extraocular movements intact.  Sclera/Conjunctiva - Bilateral - No scleral icterus.  Chest and Lung Exam  Chest and lung exam reveals - quiet, even and easy respiratory effort with no use of accessory muscles and on auscultation, normal breath sounds, no adventitious sounds and normal vocal resonance.  Inspection  Chest Wall - Normal. Back - normal.  Breast  Note: 3 CM RIGHT BREAST MASS MOBILE UOQ NON TENDER.  Cardiovascular  Cardiovascular examination reveals - on palpation PMI is normal in location and amplitude, no palpable S3 or S4. Normal cardiac borders., normal heart sounds, regular rate and rhythm with no murmurs, carotid auscultation reveals no bruits and normal pedal pulses bilaterally.  Neurologic  Neurologic evaluation reveals - alert and oriented x 3 with no impairment of recent or remote memory.  Mental Status - Normal.  Musculoskeletal  Normal Exam - Left - Upper Extremity Strength Normal and Lower Extremity Strength Normal.  Normal Exam - Right - Upper Extremity Strength Normal, Lower Extremity Weakness.  Lymphatic  Axillary  General Axillary Region: Right - Description - Localized lymphadenopathy. Size - 2.0 cm. Consistency - Firm. Mobility - Mobile. Tenderness - Non Tender.  Assessment & Plan (Vipul Cafarelli A. Ismahan Lippman MD; 01/02/2014 12:05 PM)  BREAST CANCER, RIGHT (174.9  C50.911)  Impression: Risk of RIGHT SEED lumpectomy include bleeding, infection, seroma, more surgery, use of seed/wire, wound care, cosmetic deformity and the need for other treatments, death , blood clots, death. Pt agrees to proceed.  Risk of axillary  lymph node dissection  include bleeding, infection, lymphedema, shoulder pain. stiffness, dye allergy. cosmetic deformity , blood clots, death, need for more surgery. PORT REMOVAL Pt agres to proceed.  Current Plans  Schedule for Surgery  Advised patient to stop ASA, anticoagulant, blood thinners, and NSAIDs five (5) days prior to surgery.  PORT CATHETER IN PLACE (V45.89  [phone])  Impression: WILL REMOVE AT THIS POINT SINCE NO LONGER CHEMOTHERAPY CANDIDATE.  ATRIAL FIBRILLATION (427.31  I48.91)  Impression: HOLD ELOQUIS FOR 5 DAYS PRIOR TO SURGERY

## 2014-01-04 NOTE — Telephone Encounter (Signed)
Whitney [name] is aware but still needs bed orders to be placed.

## 2014-01-05 ENCOUNTER — Other Ambulatory Visit (INDEPENDENT_AMBULATORY_CARE_PROVIDER_SITE_OTHER): Payer: Self-pay | Admitting: Surgery

## 2014-01-05 ENCOUNTER — Ambulatory Visit
Admission: RE | Admit: 2014-01-05 | Discharge: 2014-01-05 | Disposition: A | Discharge status: Home / Self Care | Payer: Medicare Other | Source: Ambulatory Visit | Attending: Hematology and Oncology | Admitting: Hematology and Oncology

## 2014-01-05 ENCOUNTER — Other Ambulatory Visit: Payer: Self-pay | Admitting: Hematology and Oncology

## 2014-01-05 ENCOUNTER — Ambulatory Visit: Admission: RE | Admit: 2014-01-05 | Payer: Medicare Other | Source: Ambulatory Visit

## 2014-01-05 ENCOUNTER — Ambulatory Visit: Payer: Medicare Other | Admitting: Nurse Practitioner

## 2014-01-05 ENCOUNTER — Other Ambulatory Visit: Payer: Medicare Other

## 2014-01-05 DIAGNOSIS — C50411 Malignant neoplasm of upper-outer quadrant of right female breast: Secondary | ICD-10-CM

## 2014-01-05 DIAGNOSIS — C50911 Malignant neoplasm of unspecified site of right female breast: Secondary | ICD-10-CM

## 2014-01-07 LAB — CULTURE, BLOOD (SINGLE): Organism ID, Bacteria: NO GROWTH

## 2014-01-08 ENCOUNTER — Encounter (HOSPITAL_BASED_OUTPATIENT_CLINIC_OR_DEPARTMENT_OTHER)
Admission: RE | Admit: 2014-01-08 | Discharge: 2014-01-08 | Disposition: A | Discharge status: Home / Self Care | Payer: Medicare Other | Source: Ambulatory Visit | Attending: Surgery | Admitting: Surgery

## 2014-01-08 ENCOUNTER — Ambulatory Visit
Admission: RE | Admit: 2014-01-08 | Discharge: 2014-01-08 | Disposition: A | Discharge status: Home / Self Care | Payer: Medicare Other | Source: Ambulatory Visit | Attending: Surgery | Admitting: Surgery

## 2014-01-08 DIAGNOSIS — C50411 Malignant neoplasm of upper-outer quadrant of right female breast: Secondary | ICD-10-CM | POA: Diagnosis present

## 2014-01-08 DIAGNOSIS — C773 Secondary and unspecified malignant neoplasm of axilla and upper limb lymph nodes: Secondary | ICD-10-CM | POA: Diagnosis not present

## 2014-01-08 DIAGNOSIS — D0511 Intraductal carcinoma in situ of right breast: Secondary | ICD-10-CM | POA: Diagnosis not present

## 2014-01-08 DIAGNOSIS — C50911 Malignant neoplasm of unspecified site of right female breast: Secondary | ICD-10-CM

## 2014-01-08 LAB — CBC WITH DIFFERENTIAL/PLATELET
Basophils Absolute: 0 10*3/uL (ref 0.0–0.1)
Basophils Relative: 1 % (ref 0–1)
Eosinophils Absolute: 0.1 10*3/uL (ref 0.0–0.7)
Eosinophils Relative: 2 % (ref 0–5)
HCT: 31.5 % — ABNORMAL LOW (ref 36.0–46.0)
Hemoglobin: 9.9 g/dL — ABNORMAL LOW (ref 12.0–15.0)
Lymphocytes Relative: 16 % (ref 12–46)
Lymphs Abs: 0.9 10*3/uL (ref 0.7–4.0)
MCH: 28.4 pg (ref 26.0–34.0)
MCHC: 31.4 g/dL (ref 30.0–36.0)
MCV: 90.5 fL (ref 78.0–100.0)
Monocytes Absolute: 0.5 10*3/uL (ref 0.1–1.0)
Monocytes Relative: 10 % (ref 3–12)
Neutro Abs: 3.8 10*3/uL (ref 1.7–7.7)
Neutrophils Relative %: 71 % (ref 43–77)
Platelets: 262 10*3/uL (ref 150–400)
RBC: 3.48 MIL/uL — ABNORMAL LOW (ref 3.87–5.11)
RDW: 19.7 % — ABNORMAL HIGH (ref 11.5–15.5)
WBC: 5.4 10*3/uL (ref 4.0–10.5)

## 2014-01-08 LAB — COMPREHENSIVE METABOLIC PANEL
ALT: 12 U/L (ref 0–35)
AST: 16 U/L (ref 0–37)
Albumin: 3.2 g/dL — ABNORMAL LOW (ref 3.5–5.2)
Alkaline Phosphatase: 58 U/L (ref 39–117)
Anion gap: 12 (ref 5–15)
BUN: 17 mg/dL (ref 6–23)
CO2: 28 mEq/L (ref 19–32)
Calcium: 9.7 mg/dL (ref 8.4–10.5)
Chloride: 100 mEq/L (ref 96–112)
Creatinine, Ser: 1.13 mg/dL — ABNORMAL HIGH (ref 0.50–1.10)
GFR calc Af Amer: 51 mL/min — ABNORMAL LOW (ref >90)
GFR calc non Af Amer: 44 mL/min — ABNORMAL LOW (ref >90)
Glucose, Bld: 120 mg/dL — ABNORMAL HIGH (ref 70–99)
Potassium: 4 mEq/L (ref 3.7–5.3)
Sodium: 140 mEq/L (ref 137–147)
Total Bilirubin: 0.3 mg/dL (ref 0.3–1.2)
Total Protein: 6.3 g/dL (ref 6.0–8.3)

## 2014-01-09 ENCOUNTER — Encounter (HOSPITAL_BASED_OUTPATIENT_CLINIC_OR_DEPARTMENT_OTHER): Payer: Medicare Other | Admitting: Anesthesiology

## 2014-01-09 ENCOUNTER — Encounter (HOSPITAL_BASED_OUTPATIENT_CLINIC_OR_DEPARTMENT_OTHER)
Admission: RE | Disposition: A | Discharge status: Home / Self Care | Payer: Self-pay | Source: Ambulatory Visit | Attending: Surgery

## 2014-01-09 ENCOUNTER — Ambulatory Visit
Admission: RE | Admit: 2014-01-09 | Discharge: 2014-01-09 | Disposition: A | Discharge status: Home / Self Care | Payer: Medicare Other | Source: Ambulatory Visit | Attending: Surgery | Admitting: Surgery

## 2014-01-09 ENCOUNTER — Ambulatory Visit (HOSPITAL_BASED_OUTPATIENT_CLINIC_OR_DEPARTMENT_OTHER): Payer: Medicare Other | Admitting: Anesthesiology

## 2014-01-09 ENCOUNTER — Encounter (HOSPITAL_BASED_OUTPATIENT_CLINIC_OR_DEPARTMENT_OTHER): Payer: Self-pay | Admitting: *Deleted

## 2014-01-09 ENCOUNTER — Ambulatory Visit (HOSPITAL_BASED_OUTPATIENT_CLINIC_OR_DEPARTMENT_OTHER)
Admission: RE | Admit: 2014-01-09 | Discharge: 2014-01-09 | Disposition: A | Discharge status: Home / Self Care | Payer: Medicare Other | Source: Ambulatory Visit | Attending: Surgery | Admitting: Surgery

## 2014-01-09 DIAGNOSIS — C773 Secondary and unspecified malignant neoplasm of axilla and upper limb lymph nodes: Secondary | ICD-10-CM | POA: Insufficient documentation

## 2014-01-09 DIAGNOSIS — D0511 Intraductal carcinoma in situ of right breast: Secondary | ICD-10-CM | POA: Diagnosis not present

## 2014-01-09 DIAGNOSIS — C50911 Malignant neoplasm of unspecified site of right female breast: Secondary | ICD-10-CM

## 2014-01-09 DIAGNOSIS — C50411 Malignant neoplasm of upper-outer quadrant of right female breast: Secondary | ICD-10-CM | POA: Diagnosis not present

## 2014-01-09 HISTORY — DX: Presence of spectacles and contact lenses: Z97.3

## 2014-01-09 HISTORY — DX: Presence of dental prosthetic device (complete) (partial): Z97.2

## 2014-01-09 HISTORY — PX: AXILLARY LYMPH NODE DISSECTION: SHX5229

## 2014-01-09 HISTORY — PX: BREAST LUMPECTOMY WITH RADIOACTIVE SEED LOCALIZATION: SHX6424

## 2014-01-09 HISTORY — PX: PORT-A-CATH REMOVAL: SHX5289

## 2014-01-09 HISTORY — DX: Complete loss of teeth, unspecified cause, unspecified class: K08.109

## 2014-01-09 HISTORY — DX: Cardiac arrhythmia, unspecified: I49.9

## 2014-01-09 LAB — GLUCOSE, CAPILLARY
Glucose-Capillary: 134 mg/dL — ABNORMAL HIGH (ref 70–99)
Glucose-Capillary: 113 mg/dL — ABNORMAL HIGH (ref 70–99)

## 2014-01-09 SURGERY — REMOVAL PORT-A-CATH
Anesthesia: General | Site: Chest | Laterality: Right

## 2014-01-09 MED ORDER — HYDROMORPHONE HCL 1 MG/ML IJ SOLN
0.2500 mg | INTRAMUSCULAR | Status: DC | PRN
  Administered 2014-01-09: 0.25 mg via INTRAVENOUS

## 2014-01-09 MED ORDER — BUPIVACAINE-EPINEPHRINE 0.25% -1:200000 IJ SOLN
INTRAMUSCULAR | Status: DC | PRN
  Administered 2014-01-09: 11 mL

## 2014-01-09 MED ORDER — CEFAZOLIN SODIUM-DEXTROSE 2-3 GM-% IV SOLR
INTRAVENOUS | Status: AC
  Filled 2014-01-09: qty 50

## 2014-01-09 MED ORDER — OXYCODONE HCL 5 MG/5ML PO SOLN: 5.0000 mg | Freq: Once | ORAL | Status: AC | PRN

## 2014-01-09 MED ORDER — OXYCODONE HCL 5 MG PO TABS
5.0000 mg | ORAL_TABLET | Freq: Once | ORAL | Status: AC | PRN
  Administered 2014-01-09: 5 mg via ORAL

## 2014-01-09 MED ORDER — MIDAZOLAM HCL 2 MG/2ML IJ SOLN
1.0000 mg | INTRAMUSCULAR | Status: DC | PRN
  Administered 2014-01-09: 1 mg via INTRAVENOUS

## 2014-01-09 MED ORDER — OXYCODONE HCL 5 MG PO TABS
ORAL_TABLET | ORAL | Status: AC
  Filled 2014-01-09: qty 1

## 2014-01-09 MED ORDER — MIDAZOLAM HCL 2 MG/2ML IJ SOLN
INTRAMUSCULAR | Status: AC
  Filled 2014-01-09: qty 2

## 2014-01-09 MED ORDER — BUPIVACAINE-EPINEPHRINE (PF) 0.25% -1:200000 IJ SOLN
INTRAMUSCULAR | Status: AC
  Filled 2014-01-09: qty 30

## 2014-01-09 MED ORDER — OXYCODONE-ACETAMINOPHEN 5-325 MG PO TABS: 1.0000 | ORAL_TABLET | ORAL | Status: DC | PRN

## 2014-01-09 MED ORDER — FENTANYL CITRATE 0.05 MG/ML IJ SOLN
INTRAMUSCULAR | Status: AC
  Filled 2014-01-09: qty 6

## 2014-01-09 MED ORDER — LIDOCAINE HCL (CARDIAC) 20 MG/ML IV SOLN
INTRAVENOUS | Status: DC | PRN
  Administered 2014-01-09: 50 mg via INTRAVENOUS

## 2014-01-09 MED ORDER — PROPOFOL 10 MG/ML IV BOLUS
INTRAVENOUS | Status: DC | PRN
  Administered 2014-01-09: 150 mg via INTRAVENOUS
  Administered 2014-01-09: 50 mg via INTRAVENOUS

## 2014-01-09 MED ORDER — ROPIVACAINE HCL 5 MG/ML IJ SOLN
INTRAMUSCULAR | Status: DC | PRN
  Administered 2014-01-09: 20 mg

## 2014-01-09 MED ORDER — PROPOFOL 10 MG/ML IV BOLUS
INTRAVENOUS | Status: AC
  Filled 2014-01-09: qty 20

## 2014-01-09 MED ORDER — PROMETHAZINE HCL 25 MG/ML IJ SOLN: 6.2500 mg | INTRAMUSCULAR | Status: DC | PRN

## 2014-01-09 MED ORDER — CEFAZOLIN SODIUM-DEXTROSE 2-3 GM-% IV SOLR
INTRAVENOUS | Status: DC | PRN
  Administered 2014-01-09: 2 g via INTRAVENOUS

## 2014-01-09 MED ORDER — DEXAMETHASONE SODIUM PHOSPHATE 4 MG/ML IJ SOLN: INTRAMUSCULAR | Status: DC | PRN

## 2014-01-09 MED ORDER — HEMOSTATIC AGENTS (NO CHARGE) OPTIME
TOPICAL | Status: DC | PRN
  Administered 2014-01-09: 1 via TOPICAL

## 2014-01-09 MED ORDER — CEFAZOLIN SODIUM-DEXTROSE 2-3 GM-% IV SOLR: 2.0000 g | INTRAVENOUS | Status: DC

## 2014-01-09 MED ORDER — LACTATED RINGERS IV SOLN
INTRAVENOUS | Status: DC
  Administered 2014-01-09 (×2): via INTRAVENOUS

## 2014-01-09 MED ORDER — HYDROMORPHONE HCL 1 MG/ML IJ SOLN
INTRAMUSCULAR | Status: AC
  Filled 2014-01-09: qty 1

## 2014-01-09 MED ORDER — FENTANYL CITRATE 0.05 MG/ML IJ SOLN
INTRAMUSCULAR | Status: DC | PRN
  Administered 2014-01-09 (×2): 50 ug via INTRAVENOUS

## 2014-01-09 MED ORDER — 0.9 % SODIUM CHLORIDE (POUR BTL) OPTIME
TOPICAL | Status: DC | PRN
  Administered 2014-01-09: 300 mL

## 2014-01-09 MED ORDER — FENTANYL CITRATE 0.05 MG/ML IJ SOLN
INTRAMUSCULAR | Status: AC
  Filled 2014-01-09: qty 2

## 2014-01-09 MED ORDER — FENTANYL CITRATE 0.05 MG/ML IJ SOLN
50.0000 ug | INTRAMUSCULAR | Status: DC | PRN
  Administered 2014-01-09: 100 ug via INTRAVENOUS

## 2014-01-09 SURGICAL SUPPLY — 71 items
APL SKNCLS STERI-STRIP NONHPOA (GAUZE/BANDAGES/DRESSINGS)
APPLIER CLIP 11 MED OPEN (CLIP) ×5
APR CLP MED 11 20 MLT OPN (CLIP) ×4
BENZOIN TINCTURE PRP APPL 2/3 (GAUZE/BANDAGES/DRESSINGS) IMPLANT
BINDER BREAST LRG (GAUZE/BANDAGES/DRESSINGS) IMPLANT
BINDER BREAST MEDIUM (GAUZE/BANDAGES/DRESSINGS) IMPLANT
BINDER BREAST XLRG (GAUZE/BANDAGES/DRESSINGS) ×2 IMPLANT
BINDER BREAST XXLRG (GAUZE/BANDAGES/DRESSINGS) IMPLANT
BLADE SURG 15 STRL LF DISP TIS (BLADE) ×4 IMPLANT
BLADE SURG 15 STRL SS (BLADE) ×5
BNDG COHESIVE 4X5 TAN STRL (GAUZE/BANDAGES/DRESSINGS) ×5 IMPLANT
CANISTER SUCT 1200ML W/VALVE (MISCELLANEOUS) ×5 IMPLANT
CHLORAPREP W/TINT 26ML (MISCELLANEOUS) ×5 IMPLANT
CLIP APPLIE 11 MED OPEN (CLIP) ×4 IMPLANT
CLIP TI WIDE RED SMALL 6 (CLIP) IMPLANT
COVER BACK TABLE 60X90IN (DRAPES) ×5 IMPLANT
COVER MAYO STAND STRL (DRAPES) ×5 IMPLANT
DECANTER SPIKE VIAL GLASS SM (MISCELLANEOUS) ×5 IMPLANT
DEVICE DUBIN W/COMP PLATE 8390 (MISCELLANEOUS) ×2 IMPLANT
DRAIN CHANNEL 19F RND (DRAIN) ×5 IMPLANT
DRAPE LAPAROSCOPIC ABDOMINAL (DRAPES) IMPLANT
DRAPE PED LAPAROTOMY (DRAPES) ×3 IMPLANT
DRAPE UTILITY XL STRL (DRAPES) ×5 IMPLANT
DRSG PAD ABDOMINAL 8X10 ST (GAUZE/BANDAGES/DRESSINGS) ×2 IMPLANT
ELECT COATED BLADE 2.86 ST (ELECTRODE) ×5 IMPLANT
ELECT REM PT RETURN 9FT ADLT (ELECTROSURGICAL) ×5
ELECTRODE REM PT RTRN 9FT ADLT (ELECTROSURGICAL) ×4 IMPLANT
EVACUATOR SILICONE 100CC (DRAIN) ×5 IMPLANT
GLOVE BIOGEL PI IND STRL 7.0 (GLOVE) ×1 IMPLANT
GLOVE BIOGEL PI IND STRL 8 (GLOVE) ×4 IMPLANT
GLOVE BIOGEL PI INDICATOR 7.0 (GLOVE) ×1
GLOVE BIOGEL PI INDICATOR 8 (GLOVE) ×1
GLOVE ECLIPSE 6.5 STRL STRAW (GLOVE) ×2 IMPLANT
GLOVE ECLIPSE 8.0 STRL XLNG CF (GLOVE) ×10 IMPLANT
GLOVE EXAM NITRILE MD LF STRL (GLOVE) ×2 IMPLANT
GOWN STRL REUS W/ TWL LRG LVL3 (GOWN DISPOSABLE) ×8 IMPLANT
GOWN STRL REUS W/TWL LRG LVL3 (GOWN DISPOSABLE) ×10
HEMOSTAT SNOW SURGICEL 2X4 (HEMOSTASIS) ×2 IMPLANT
HEMOSTAT SURGICEL 2X14 (HEMOSTASIS) IMPLANT
KIT MARKER MARGIN INK (KITS) ×2 IMPLANT
LIQUID BAND (GAUZE/BANDAGES/DRESSINGS) ×9 IMPLANT
NDL HYPO 25X1 1.5 SAFETY (NEEDLE) ×3 IMPLANT
NEEDLE HYPO 25X1 1.5 SAFETY (NEEDLE) ×5 IMPLANT
NS IRRIG 1000ML POUR BTL (IV SOLUTION) ×5 IMPLANT
PACK BASIN DAY SURGERY FS (CUSTOM PROCEDURE TRAY) ×5 IMPLANT
PENCIL BUTTON HOLSTER BLD 10FT (ELECTRODE) ×5 IMPLANT
PIN SAFETY STERILE (MISCELLANEOUS) ×5 IMPLANT
SLEEVE SCD COMPRESS KNEE MED (MISCELLANEOUS) ×5 IMPLANT
SPONGE GAUZE 4X4 12PLY STER LF (GAUZE/BANDAGES/DRESSINGS) ×2 IMPLANT
SPONGE INTESTINAL PEANUT (DISPOSABLE) ×5 IMPLANT
SPONGE LAP 4X18 X RAY DECT (DISPOSABLE) ×8 IMPLANT
STAPLER VISISTAT 35W (STAPLE) IMPLANT
STRIP CLOSURE SKIN 1/2X4 (GAUZE/BANDAGES/DRESSINGS) IMPLANT
SUT ETHILON 2 0 FS 18 (SUTURE) ×5 IMPLANT
SUT MON AB 4-0 PC3 18 (SUTURE) ×7 IMPLANT
SUT SILK 2 0 SH (SUTURE) IMPLANT
SUT SILK 3 0 SH 30 (SUTURE) IMPLANT
SUT VIC AB 2-0 SH 27 (SUTURE)
SUT VIC AB 2-0 SH 27XBRD (SUTURE) IMPLANT
SUT VIC AB 3-0 FS2 27 (SUTURE) IMPLANT
SUT VIC AB 3-0 SH 27 (SUTURE) ×5
SUT VIC AB 3-0 SH 27X BRD (SUTURE) ×4 IMPLANT
SUT VICRYL 3-0 CR8 SH (SUTURE) ×5 IMPLANT
SUT VICRYL 4-0 PS2 18IN ABS (SUTURE) IMPLANT
SUT VICRYL AB 2 0 TIE (SUTURE) IMPLANT
SUT VICRYL AB 2 0 TIES (SUTURE)
SYR CONTROL 10ML LL (SYRINGE) ×5 IMPLANT
TOWEL OR 17X24 6PK STRL BLUE (TOWEL DISPOSABLE) ×8 IMPLANT
TOWEL OR NON WOVEN STRL DISP B (DISPOSABLE) ×5 IMPLANT
TUBE CONNECTING 20X1/4 (TUBING) ×5 IMPLANT
YANKAUER SUCT BULB TIP NO VENT (SUCTIONS) ×5 IMPLANT

## 2014-01-09 NOTE — Anesthesia Postprocedure Evaluation (Signed)
Anesthesia Post Note  Patient: Whitney [name]  Procedure(s) Performed: Procedure(s) (LRB): REMOVAL PORT-A-CATH (Right) RIGHT BREAST SEED LOCALIZED LUMPECTOMY  (Right) RIGHT AXILLARY LYMPH NODE DISECTION (Right)  Anesthesia type: general  Patient location: PACU  Post pain: Pain level controlled  Post assessment: Patient's Cardiovascular Status Stable  Last Vitals:  Filed Vitals:   01/09/14 1630  BP: 163/68  Pulse: 72  Temp:   Resp: 17    Post vital signs: Reviewed and stable  Level of consciousness: sedated  Complications: No apparent anesthesia complications

## 2014-01-09 NOTE — Progress Notes (Signed)
Assisted Dr. Tobias Alexander with right, pectoralis block. Side rails up, monitors on throughout procedure. See vital signs in flow sheet. Tolerated Procedure well.

## 2014-01-09 NOTE — Transfer of Care (Signed)
Immediate Anesthesia Transfer of Care Note  Patient: Whitney [name]  Procedure(s) Performed: Procedure(s): REMOVAL PORT-A-CATH (Right) RIGHT BREAST SEED LOCALIZED LUMPECTOMY  (Right) RIGHT AXILLARY LYMPH NODE DISECTION (Right)  Patient Location: PACU  Anesthesia Type:GA combined with regional for post-op pain  Level of Consciousness: awake, alert  and patient cooperative  Airway & Oxygen Therapy: Patient Spontanous Breathing and Patient connected to face mask oxygen  Post-op Assessment: Report given to PACU RN and Post -op Vital signs reviewed and stable  Post vital signs: Reviewed and stable  Complications: No apparent anesthesia complications

## 2014-01-09 NOTE — Discharge Instructions (Signed)
Bulb Drain Home Care A bulb drain consists of a thin rubber tube and a soft, round bulb that creates a gentle suction. The rubber tube is placed in the area where you had surgery. A bulb is attached to the end of the tube that is outside the body. The bulb drain removes excess fluid that normally builds up in a surgical wound after surgery. The color and amount of fluid will vary. Immediately after surgery, the fluid is bright red and is a little thicker than water. It may gradually change to a yellow or pink color and become more thin and water-like. When the amount decreases to about 1 or 2 tbsp in 24 hours, your health care provider will usually remove it. DAILY CARE  Keep the bulb flat (compressed) at all times, except while emptying it. The flatness creates suction. You can flatten the bulb by squeezing it firmly in the middle and then closing the cap.  Keep sites where the tube enters the skin dry and covered with a bandage (dressing).  Secure the tube 1-2 in (2.5-5.1 cm) below the insertion sites to keep it from pulling on your stitches. The tube is stitched in place and will not slip out.  Secure the bulb as directed by your health care provider.  For the first 3 days after surgery, there usually is more fluid in the bulb. Empty the bulb whenever it becomes half full because the bulb does not create enough suction if it is too full. The bulb could also overflow. Write down how much fluid you remove each time you empty your drain. Add up the amount removed in 24 hours.  Empty the bulb at the same time every day once the amount of fluid decreases and you only need to empty it once a day. Write down the amounts and the 24-hour totals to give to your health care provider. This helps your health care provider know when the tubes can be removed. EMPTYING THE BULB DRAIN Before emptying the bulb, get a measuring cup, a piece of paper and a pen, and wash your hands.  Gently run your fingers down the  tube (stripping) to empty any drainage from the tubing into the bulb. This may need to be done several times a day to clear the tubing of clots and tissue.  Open the bulb cap to release suction, which causes it to inflate. Do not touch the inside of the cap.  Gently run your fingers down the tube (stripping) to empty any drainage from the tubing into the bulb.  Hold the cap out of the way, and pour fluid into the measuring cup.   Squeeze the bulb to provide suction.  Replace the cap.   Check the tape that holds the tube to your skin. If it is becoming loose, you can remove the loose piece of tape and apply a new one. Then, pin the bulb to your shirt.   Write down the amount of fluid you emptied out. Write down the date and each time you emptied your bulb drain. (If there are 2 bulbs, note the amount of drainage from each bulb and keep the totals separate. Your health care provider will want to know the total amounts for each drain and which tube is draining more.)   Flush the fluid down the toilet and wash your hands.   Call your health care provider once you have less than 2 tbsp of fluid collecting in the bulb drain every 24 hours. If  there is drainage around the tube site, change dressings and keep the area dry. Cleanse around tube with sterile saline and place dry gauze around site. This gauze should be changed when it is soiled. If it stays clean and unsoiled, it should still be changed daily.  SEEK MEDICAL CARE IF:  Your drainage has a bad smell or is cloudy.   You have a fever.   Your drainage is increasing instead of decreasing.   Your tube fell out.   You have redness or swelling around the tube site.   You have drainage from a surgical wound.   Your bulb drain will not stay flat after you empty it.  MAKE SURE YOU:   Understand these instructions.  Will watch your condition.  Will get help right away if you are not doing well or get worse. Document  Released: 02/28/2000 Document Revised: 07/17/2013 Document Reviewed: 08/05/2011 [facility] Patient Information 2015 [place], [place]. This information is not intended to replace advice given to you by your health care provider. Make sure you discuss any questions you have with your health care provider.     [place] Office Phone Number [phone]  BREAST BIOPSY/ PARTIAL MASTECTOMY: POST OP INSTRUCTIONS  Always review your discharge instruction sheet given to you by the facility where your surgery was performed.  IF YOU HAVE DISABILITY OR FAMILY LEAVE FORMS, YOU MUST BRING THEM TO THE OFFICE FOR PROCESSING.  DO NOT GIVE THEM TO YOUR DOCTOR.  1. A prescription for pain medication may be given to you upon discharge.  Take your pain medication as prescribed, if needed.  If narcotic pain medicine is not needed, then you may take acetaminophen (Tylenol) or ibuprofen (Advil) as needed. 2. Take your usually prescribed medications unless otherwise directed 3. If you need a refill on your pain medication, please contact your pharmacy.  They will contact our office to request authorization.  Prescriptions will not be filled after 5pm or on week-ends. 4. You should eat very light the first 24 hours after surgery, such as soup, crackers, pudding, etc.  Resume your normal diet the day after surgery. 5. Most patients will experience some swelling and bruising in the breast.  Ice packs and a good support bra will help.  Swelling and bruising can take several days to resolve.  6. It is common to experience some constipation if taking pain medication after surgery.  Increasing fluid intake and taking a stool softener will usually help or prevent this problem from occurring.  A mild laxative (Milk of Magnesia or Miralax) should be taken according to package directions if there are no bowel movements after 48 hours. 7. Unless discharge instructions indicate otherwise, you may remove your bandages  24-48 hours after surgery, and you may shower at that time.  You may have steri-strips (small skin tapes) in place directly over the incision.  These strips should be left on the skin for 7-10 days.  If your surgeon used skin glue on the incision, you may shower in 24 hours.  The glue will flake off over the next 2-3 weeks.  Any sutures or staples will be removed at the office during your follow-up visit. 8. ACTIVITIES:  You may resume regular daily activities (gradually increasing) beginning the next day.  Wearing a good support bra or sports bra minimizes pain and swelling.  You may have sexual intercourse when it is comfortable. a. You may drive when you no longer are taking prescription pain medication, you can comfortably  wear a seatbelt, and you can safely maneuver your car and apply brakes. b. RETURN TO WORK:  ______________________________________________________________________________________ 9. You should see your doctor in the office for a follow-up appointment approximately two weeks after your surgery.  Your doctors nurse will typically make your follow-up appointment when she calls you with your pathology report.  Expect your pathology report 2-3 business days after your surgery.  You may call to check if you do not hear from [place] after three days. 10. OTHER INSTRUCTIONS: _______________________________________________________________________________________________ _____________________________________________________________________________________________________________________________________ _____________________________________________________________________________________________________________________________________ _____________________________________________________________________________________________________________________________________  WHEN TO CALL YOUR DOCTOR: 1. Fever over 101.0 2. Nausea and/or vomiting. 3. Extreme swelling or bruising. 4. Continued bleeding  from incision. 5. Increased pain, redness, or drainage from the incision.  The clinic staff is available to answer your questions during regular business hours.  Please dont hesitate to call and ask to speak to one of the nurses for clinical concerns.  If you have a medical emergency, go to the nearest emergency room or call 911.  A surgeon from [facility] is always on call at the hospital.  For further questions, please visit [email or web address]      Restart Eloquis wed in PM.   Post Anesthesia Home Care Instructions  Activity: Get plenty of rest for the remainder of the day. A responsible adult should stay with you for 24 hours following the procedure.  For the next 24 hours, DO NOT: -Drive a car -Paediatric nurse -Drink alcoholic beverages -Take any medication unless instructed by your physician -Make any legal decisions or sign important papers.  Meals: Start with liquid foods such as gelatin or soup. Progress to regular foods as tolerated. Avoid greasy, spicy, heavy foods. If nausea and/or vomiting occur, drink only clear liquids until the nausea and/or vomiting subsides. Call your physician if vomiting continues.  Special Instructions/Symptoms: Your throat may feel dry or sore from the anesthesia or the breathing tube placed in your throat during surgery. If this causes discomfort, gargle with warm salt water. The discomfort should disappear within 24 hours.   About my Jackson-Pratt Bulb Drain  What is a Jackson-Pratt bulb? A Jackson-Pratt is a soft, round device used to collect drainage. It is connected to a long, thin drainage catheter, which is held in place by one or two small stiches near your surgical incision site. When the bulb is squeezed, it forms a vacuum, forcing the drainage to empty into the bulb.  Emptying the Jackson-Pratt bulb- To empty the bulb: 1. Release the plug on the top of the bulb. 2. Pour the bulb's contents into a measuring  container which your nurse will provide. 3. Record the time emptied and amount of drainage. Empty the drain(s) as often as your     doctor or nurse recommends.  Date                  Time                    Amount (Drain 1)                 Amount (Drain 2)  _____________________________________________________________________  _____________________________________________________________________  _____________________________________________________________________  _____________________________________________________________________  _____________________________________________________________________  _____________________________________________________________________  _____________________________________________________________________  _____________________________________________________________________  Squeezing the Jackson-Pratt Bulb- To squeeze the bulb: 1. Make sure the plug at the top of the bulb is open. 2. Squeeze the bulb tightly in your fist. You will hear air squeezing from the bulb. 3. Replace the plug while the bulb is squeezed. 4. Use a  safety pin to attach the bulb to your clothing. This will keep the catheter from     pulling at the bulb insertion site.  When to call your doctor- Call your doctor if:  Drain site becomes red, swollen or hot.  You have a fever greater than 101 degrees F.  There is oozing at the drain site.  Drain falls out (apply a guaze bandage over the drain hole and secure it with tape).  Drainage increases daily not related to activity patterns. (You will usually have more drainage when you are active than when you are resting.)  Drainage has a bad odor.

## 2014-01-09 NOTE — Brief Op Note (Signed)
01/09/2014  3:56 PM  PATIENT:  Whitney [name]  80 y.o. female  PRE-OPERATIVE DIAGNOSIS:  Right Breast Cancer  POST-OPERATIVE DIAGNOSIS:  Right Breast Cancer  PROCEDURE:  Procedure(s): REMOVAL PORT-A-CATH (Right) RIGHT BREAST SEED LOCALIZED LUMPECTOMY  (Right) RIGHT AXILLARY LYMPH NODE DISECTION (Right)  SURGEON:  Surgeon(s) and Role:    * [name], MD - Primary       ANESTHESIA:   local and regional and LMA   EBL:  Total I/O In: 1100 [I.V.:1100] Out: -   BLOOD ADMINISTERED:none  DRAINS: 53 F round drain   LOCAL MEDICATIONS USED:  BUPIVICAINE   SPECIMEN:  Source of Specimen:  right breast right axilla  DISPOSITION OF SPECIMEN:  PATHOLOGY  COUNTS:  YES  TOURNIQUET:  * No tourniquets in log *  DICTATION: .Other Dictation: Dictation Number [id number]  PLAN OF CARE: Discharge to home after PACU  PATIENT DISPOSITION:  PACU - hemodynamically stable.   Delay start of Pharmacological VTE agent (>24hrs) due to surgical blood loss or risk of bleeding: not applicable

## 2014-01-09 NOTE — Anesthesia Procedure Notes (Addendum)
Anesthesia Regional Block:  Pectoralis block  Pre-Anesthetic Checklist: ,, timeout performed, Correct Patient, Correct Site, Correct Laterality, Correct Procedure, Correct Position, site marked, Risks and benefits discussed,  Surgical consent,  Pre-op evaluation,  At surgeon's request and post-op pain management  Laterality: Right  Prep: chloraprep       Needles:  Injection technique: Single-shot  Needle Type: Echogenic Stimulator Needle     Needle Length: 10cm 10 cm Needle Gauge: 21 and 21 G    Additional Needles:  Procedures: ultrasound guided (picture in chart) Pectoralis block Narrative:  Start time: 01/09/2014 1:25 PM End time: 01/09/2014 1:34 PM Injection made incrementally with aspirations every 5 mL.  Performed by: Personally    Procedure Name: LMA Insertion Date/Time: 01/09/2014 2:39 PM Performed by: Marrianne Mood Pre-anesthesia Checklist: Patient identified, Emergency Drugs available, Suction available, Patient being monitored and Timeout performed Patient Re-evaluated:Patient Re-evaluated prior to inductionOxygen Delivery Method: Circle System Utilized Preoxygenation: Pre-oxygenation with 100% oxygen Intubation Type: IV induction Ventilation: Mask ventilation without difficulty LMA: LMA inserted LMA Size: 4.0 Number of attempts: 1 Airway Equipment and Method: bite block Placement Confirmation: positive ETCO2 Tube secured with: Tape Dental Injury: Teeth and Oropharynx as per pre-operative assessment

## 2014-01-09 NOTE — H&P (Signed)
H&P Whitney [name] (MR# [id number])         H&P Info    Author Note Status Last Update User Last Update Date/Time   [name], MD Signed [name], MD 01/02/2014 12:34 PM         H&P    [name]  01/02/2014 9:07 AM  Location: [facility]  Patient #: [id number]  DOB: [birth year]  Widowed / Language: [name] / Race: Black or African American  Female  History of Present Illness Marcello Moores A. Prudence Heiny MD; 01/02/2014 11:56 AM)  Patient words: breast f/u  pt returns to clinic after being on chemotherapy for breast cancer. She was placed on neoadjuvant chemotherapy and has had a fib and neutropenic fever and this has been stopped. She is on Eloquis and amiodarone. Her origina tumor 3.1 cm right breast UOQ with positive level one node by bx. She is here to discuss surgery. She feels much bettr. She is in SR.  The patient is a 80 year old female  Other Problems [name], [place]; 01/02/2014 9:10 AM)  Breast Cancer  High blood pressure  Hypercholesterolemia  Thyroid Disease  Past Surgical History [name], [place]; 01/02/2014 9:10 AM)  Knee Surgery Left.  Resection of Stomach  Diagnostic Studies History [name], [place]; 01/02/2014 9:10 AM)  Colonoscopy never  Mammogram within last year  Allergies ([place], [place]; 01/02/2014 9:10 AM)  No Known Drug Allergies 01/02/2014  Medication History ([name], CMA; 01/02/2014 9:10 AM)  Amiodarone HCl (200MG  Tablet, Oral daily) Active.  Atorvastatin Calcium (10MG  Tablet, Oral daily) Active.  AmLODIPine Besylate (5MG  Tablet, Oral daily) Active.  Eliquis (5MG  Tablet, Oral daily) Active.  Furosemide (40MG  Tablet, Oral daily) Active.  Klor-Con M20 [facility] Tablet ER, Oral daily) Active.  MetFORMIN HCl ER (500MG  Tablet ER 24HR, Oral daily) Active.  Metoprolol Succinate ER (25MG  Tablet ER 24HR, Oral daily) Active.  Synthroid (50MCG Tablet, Oral daily) Active.  Pioglitazone HCl (30MG  Tablet,  Oral daily) Active.  Lidocaine-Prilocaine (2.5-2.5% Cream, External as needed) Active.  Dexamethasone (4MG  Tablet, Oral daily) Active.  Lisinopril-Hydrochlorothiazide (20-12.5MG  Tablet, Oral daily) Active.  Social History ([place]; 01/02/2014 9:10 AM)  Caffeine use Carbonated beverages, Coffee.  No drug use  Tobacco use Former smoker.  Family History [name], CMA; 01/02/2014 9:10 AM)  Family history unknown  First Degree Relatives  Pregnancy / Birth History [name], CMA; 01/02/2014 9:10 AM)  Age at menarche 53 years.  Age of menopause <47  Gravida 4  Irregular periods  Maternal age 13-20  Para 4  Review of Systems ([place]; 01/02/2014 9:10 AM)  General Present- Weight Loss. Not Present- Appetite Loss, Chills, Fatigue, Fever, Night Sweats and Weight Gain.  Skin Not Present- Change in Wart/Mole, Dryness, Hives, Jaundice, New Lesions, Non-Healing Wounds, Rash and Ulcer.  HEENT Present- Wears glasses/contact lenses. Not Present- Earache, Hearing Loss, Hoarseness, Nose Bleed, Oral Ulcers, Ringing in the Ears, Seasonal Allergies, Sinus Pain, Sore Throat, Visual Disturbances and Yellow Eyes.  Breast Present- Breast Mass. Not Present- Breast Pain, Nipple Discharge and Skin Changes.  Cardiovascular Not Present- Chest Pain, Difficulty Breathing Lying Down, Leg Cramps, Palpitations, Rapid Heart Rate, Shortness of Breath and Swelling of Extremities.  Gastrointestinal Not Present- Abdominal Pain, Bloating, Bloody Stool, Change in Bowel Habits, Chronic diarrhea, Constipation, Difficulty Swallowing, Excessive gas, Gets full quickly at meals, Hemorrhoids, Indigestion, Nausea, Rectal Pain and Vomiting.  Female Genitourinary Not Present-  Frequency, Nocturia, Painful Urination, Pelvic Pain and Urgency.  Musculoskeletal Not Present- Back Pain, Joint Pain, Joint Stiffness, Muscle Pain, Muscle Weakness and Swelling of Extremities.  Hematology Present- Easy Bruising. Not Present-  Excessive bleeding, Gland problems, HIV and Persistent Infections.  Vitals ([name] CMA; 01/02/2014 9:12 AM)  01/02/2014 9:11 AM  Weight: 195 lb Height: 71 in  Body Surface Area: 2.1 m Body Mass Index: 27.2 kg/m  Temp.: 97 F (Temporal) Pulse: 77 (Regular)  BP: 130/70 (Sitting, Left Arm, Standard)  Physical Exam (Demontrae Gilbert A. Caitlyn Buchanan MD; 01/02/2014 11:57 AM)  General  Mental Status - Alert.  General Appearance - Consistent with stated age.  Hydration - Well hydrated.  Voice - Normal.  Head and Neck  Head - normocephalic, atraumatic with no lesions or palpable masses.  Eye  Eyeball - Bilateral - Extraocular movements intact.  Sclera/Conjunctiva - Bilateral - No scleral icterus.  Chest and Lung Exam  Chest and lung exam reveals - quiet, even and easy respiratory effort with no use of accessory muscles and on auscultation, normal breath sounds, no adventitious sounds and normal vocal resonance.  Inspection  Chest Wall - Normal. Back - normal.  Breast  Note: 3 CM RIGHT BREAST MASS MOBILE UOQ NON TENDER.  Cardiovascular  Cardiovascular examination reveals - on palpation PMI is normal in location and amplitude, no palpable S3 or S4. Normal cardiac borders., normal heart sounds, regular rate and rhythm with no murmurs, carotid auscultation reveals no bruits and normal pedal pulses bilaterally.  Neurologic  Neurologic evaluation reveals - alert and oriented x 3 with no impairment of recent or remote memory.  Mental Status - Normal.  Musculoskeletal  Normal Exam - Left - Upper Extremity Strength Normal and Lower Extremity Strength Normal.  Normal Exam - Right - Upper Extremity Strength Normal, Lower Extremity Weakness.  Lymphatic  Axillary  General Axillary Region: Right - Description - Localized lymphadenopathy. Size - 2.0 cm. Consistency - Firm. Mobility - Mobile. Tenderness - Non Tender.  Assessment & Plan (Jamariya Davidoff A. Marten Iles MD; 01/02/2014 12:05 PM)  BREAST CANCER, RIGHT (174.9   C50.911)  Impression: Risk of RIGHT SEED lumpectomy include bleeding, infection, seroma, more surgery, use of seed/wire, wound care, cosmetic deformity and the need for other treatments, death , blood clots, death. Pt agrees to proceed. Risk of sentinel lymph node mapping include bleeding, infection, lymphedema, shoulder pain. stiffness, dye allergy. cosmetic deformity , blood clots, death, need for more surgery. PORT REMOVAL Pt agres to proceed.  Current Plans  Schedule for Surgery  Advised patient to stop ASA, anticoagulant, blood thinners, and NSAIDs five (5) days prior to surgery.  PORT CATHETER IN PLACE (V45.89  [phone])  Impression: WILL REMOVE AT THIS POINT SINCE NO LONGER CHEMOTHERAPY CANDIDATE.  ATRIAL FIBRILLATION (427.31  I48.91)  Impression: HOLD ELOQUIS FOR 5 DAYS PRIOR TO SURGERY

## 2014-01-09 NOTE — Anesthesia Preprocedure Evaluation (Signed)
Anesthesia Evaluation  Patient identified by MRN, date of birth, ID band Patient awake    History of Anesthesia Complications (+) PROLONGED EMERGENCE  Airway Mallampati: II  TM Distance: >3 FB Neck ROM: Full    Dental  (+) Upper Dentures, Missing, Dental Advisory Given   Pulmonary former smoker,    Pulmonary exam normal       Cardiovascular hypertension, Pt. on medications and Pt. on home beta blockers +CHF     Neuro/Psych negative neurological ROS  negative psych ROS   GI/Hepatic negative GI ROS, Neg liver ROS,   Endo/Other  diabetes  Renal/GU negative Renal ROS     Musculoskeletal   Abdominal   Peds  Hematology   Anesthesia Other Findings   Reproductive/Obstetrics                             Anesthesia Physical Anesthesia Plan  ASA: III  Anesthesia Plan: General   Post-op Pain Management:    Induction: Intravenous  Airway Management Planned: LMA  Additional Equipment:   Intra-op Plan:   Post-operative Plan: Extubation in OR  Informed Consent: I have reviewed the patients History and Physical, chart, labs and discussed the procedure including the risks, benefits and alternatives for the proposed anesthesia with the patient or authorized representative who has indicated his/her understanding and acceptance.   Dental advisory given  Plan Discussed with: CRNA, Anesthesiologist and Surgeon  Anesthesia Plan Comments:         Anesthesia Quick Evaluation

## 2014-01-09 NOTE — Op Note (Signed)
Whitney [name], Whitney [name]             ACCOUNT NO.:  [id number]  MEDICAL RECORD NO.:  [id number]  LOCATION:                                 FACILITY:  PHYSICIAN:  Amoni Scallan A. Jini Horiuchi, M.D.DATE OF BIRTH:  Jun 11, 1927  DATE OF PROCEDURE:  01/09/2014 DATE OF DISCHARGE:                              OPERATIVE REPORT   PREOPERATIVE DIAGNOSIS:  Stage IIB right breast cancer.  POSTOPERATIVE DIAGNOSIS:  Stage IIB right breast cancer.  PROCEDURE: 1. Right breast seed localized lumpectomy. 2. Right axillary lymph node dissection. 3. Explantation of right Port-A-Cath.  SURGEON:  Marcello Moores A. Lauralie Blacksher, M.D.  ANESTHESIA:  LMA with pectoral block per anesthesia +0.25% Sensorcaine local with epinephrine.  ESTIMATED BLOOD LOSS:  Less than 20 mL.  SPECIMEN: 1. Right breast mass with clip and seed verified by radiograph. 2. Right axillary contents to Pathology 3. Port-A-Cath which was discarded.  IV FLUIDS:  About 500 mL crystalloid.  INDICATIONS FOR PROCEDURE:  The patient is an 80 year old female with stage IIB right breast cancer.  She was on chemotherapy, but did very poorly and developed AFib as well as sepsis from neutropenia while on it and this was stopped.  She presents for a definitive surgical excision at this point since she has failed chemotherapy and will want radiation therapy after for her triple negative breast cancer.  Risk of bleeding, infection, worsening of medical problems, death, DVT, organ injury, nerve injury, injury to major blood vessels, cosmetic deformity, pain, and the need for other operative procedures as well as issues with her anticoagulation postoperatively were all discussed with the patient and family at the bedside.  She wished to proceed.  DESCRIPTION OF PROCEDURE:  The patient was met in the holding area and questions were answered.  Right breast was marked as correct side. Neoprobe was used to verify seed location which was in the right breast. Questions  were answered.  She was taken back to the operating room and placed supine on the OR table.  After induction of LMA anesthesia, right chest was prepped and draped in the sterile fashion.  Time-out was done to verify correct patient, correct procedure which was right breast seed localized lumpectomy and right axillary lymph node dissection due to a positive preoperative lymph node and removal port since her chemotherapy was complete.  The port was removed first.  Incision was made over the old port site down and we dissected down to find the port hub.  The suture holding the port in place was cut and the entire port was removed in its entirety.  The tract was closed with 3-0 Vicryl and the wound was closed with 3-0 Vicryl and Dermabond.  Lumpectomy was done next.  Neoprobe was used to identify the seed in the right upper outer quadrant of the breast.  Curvilinear incision was made to include some skin to excise the entire tumor in its entirety. Neoprobe was used to guide her excision.  There are no background counts after removal of the tumor.  Gross margins negative.  Specimen radiograph showed both clips to be in the center of the specimen.  The cavity was made hemostatic with cautery and then clips were used to mark  the cavity.  The right axillary lymph node dissection was then done.  A transverse incision was made along the hairline of the right axilla.  Dissection was carried down to the skin and subcutaneous tissues into the right axillary contents.  The axillary vein was identified and dissected out as well as a long thoracic nerve and thoracodorsal trunks.  All lymphovascular tissue between these markers were removed.  Clips were used for small bleeding vessels and we preserved the long thoracic nerve, as well as the thoracodorsal trunk.  All this lymphovascular sutures passed in the field and made hemostatic with cautery.  Surgicel was placed in the cavity.  Drain was placed  through a separate stab incision that went both into the lumpectomy cavity and into the right axilla.  Secured to the skin with 2-0 nylon.  Hemostasis was achieved. Both cavities were closed with 3-0 Vicryl and 4-0 Monocryl subcuticular stitches.  Dermabond applied to both.  Bulb was placed to suction with good seal.  Dry dressings were applied and binder placed.  All final counts of sponge, needle, and instrument was found to be correct at this portion of the case.  The patient awoke, extubated, taken to recovery in satisfactory condition.     Whitney [name], M.D.     TAC/MEDQ  D:  01/09/2014  T:  01/09/2014  Job:  [id number]

## 2014-01-09 NOTE — Interval H&P Note (Signed)
History and Physical Interval Note:  01/09/2014 2:18 PM  Whitney [name]  has presented today for surgery, with the diagnosis of Right Breast Cancer  The various methods of treatment have been discussed with the patient and family. After consideration of risks, benefits and other options for treatment, the patient has consented to  Procedure(s): RIGHT BREAST SEED LOCALIZED LUMPECTOMY WITH RIGHT AXILLARY LYMPH NODE DISECTION (Right) REMOVAL PORT-A-CATH (N/A) as a surgical intervention .  The patient's history has been reviewed, patient examined, no change in status, stable for surgery.  I have reviewed the patient's chart and labs.  Questions were answered to the patient's satisfaction.     Jahlisa Rossitto A.

## 2014-01-11 ENCOUNTER — Encounter (HOSPITAL_BASED_OUTPATIENT_CLINIC_OR_DEPARTMENT_OTHER): Payer: Self-pay | Admitting: Surgery

## 2014-01-12 ENCOUNTER — Other Ambulatory Visit: Payer: Self-pay | Admitting: Endocrinology

## 2014-01-15 ENCOUNTER — Ambulatory Visit: Payer: Medicare Other | Admitting: Cardiology

## 2014-01-16 ENCOUNTER — Other Ambulatory Visit: Payer: Self-pay

## 2014-01-16 ENCOUNTER — Telehealth: Payer: Self-pay | Admitting: Hematology and Oncology

## 2014-01-16 NOTE — Telephone Encounter (Signed)
, °

## 2014-01-18 ENCOUNTER — Telehealth (INDEPENDENT_AMBULATORY_CARE_PROVIDER_SITE_OTHER): Payer: Self-pay

## 2014-01-18 NOTE — Telephone Encounter (Signed)
lmom for below path report

## 2014-01-18 NOTE — Telephone Encounter (Signed)
-----   Message from [name], MD sent at 01/16/2014  9:06 AM EST ----- Margins negative   Removed LN 5 /13 had cancer.  Will need to discuss with oncology next step but surgery done.

## 2014-01-19 ENCOUNTER — Telehealth: Payer: Self-pay | Admitting: Family Medicine

## 2014-01-19 ENCOUNTER — Other Ambulatory Visit: Payer: Self-pay | Admitting: Family Medicine

## 2014-01-19 MED ORDER — AMIODARONE HCL 200 MG PO TABS: 200.0000 mg | ORAL_TABLET | Freq: Every day | ORAL | Status: DC

## 2014-01-19 MED ORDER — APIXABAN 5 MG PO TABS: 5.0000 mg | ORAL_TABLET | Freq: Two times a day (BID) | ORAL | Status: DC

## 2014-01-19 NOTE — Telephone Encounter (Signed)
Need Sig for Amiodarone - called pt and caretaker just left and I informed her that i needed to know exactly how she takes this in order to call into the pharm. She will have them return my call.

## 2014-01-19 NOTE — Telephone Encounter (Signed)
[phone] amiodarone (PACERONE) 400 MG tablet Pt is needing a refill on apixaban (ELIQUIS) 5 MG TABS tablet and   [facility]

## 2014-01-19 NOTE — Telephone Encounter (Signed)
Pt's caregiver came by office to verify the dosage on her amiodarone and correct dosage and sig sent to pharm.

## 2014-01-22 ENCOUNTER — Telehealth: Payer: Self-pay | Admitting: Family Medicine

## 2014-01-22 NOTE — Telephone Encounter (Signed)
Pt is needing a PA on this and it usually takes 3-5 days to get approval - she has appt with heart md Thursday but to est pt. We do not have any samples of Eliquis. Question is can we change her to [place]?

## 2014-01-22 NOTE — Telephone Encounter (Signed)
[phone]  Pt is needing to speak to you about medication

## 2014-01-22 NOTE — Telephone Encounter (Signed)
Tried to call back no answer no vm

## 2014-01-22 NOTE — Telephone Encounter (Signed)
Yes 20 qday

## 2014-01-23 NOTE — Telephone Encounter (Signed)
Pt aware to come get samples.  

## 2014-01-26 ENCOUNTER — Ambulatory Visit (INDEPENDENT_AMBULATORY_CARE_PROVIDER_SITE_OTHER): Payer: Medicare Other | Admitting: Family Medicine

## 2014-01-26 ENCOUNTER — Encounter: Payer: Self-pay | Admitting: Family Medicine

## 2014-01-26 VITALS — BP 144/78 | HR 68 | Temp 98.4°F | Resp 18 | Ht 67.0 in | Wt 196.0 lb

## 2014-01-26 DIAGNOSIS — E119 Type 2 diabetes mellitus without complications: Secondary | ICD-10-CM

## 2014-01-26 DIAGNOSIS — R609 Edema, unspecified: Secondary | ICD-10-CM

## 2014-01-26 NOTE — Progress Notes (Signed)
Subjective:    Patient ID: Whitney [name], female    DOB: October 26, 1927, 80 y.o.   MRN: [id number]  HPI Patient continues to complain of swelling in her legs. She has +2 edema in her feet and +1 edema in her legs up to her knees. She is taking 2 Lasix a day. She has been on Actos for some time for her diabetes but this is contributing to her edema believe. She denies any shortness of breath. She denies any chest pain. She denies any orthopnea. However I believe she is slightly dehydrated I am concerned about increasing her diuretics further at her last creatinine was mildly elevated at the end of October. Past Medical History  Diagnosis Date  . Hypertension   . Hyperlipidemia   . Diabetes mellitus without complication   . Thyroid disease     hypothyroidism  . Anemia   . Former smoker   . Multinodular goiter   . Complication of anesthesia     slow to wake up  . Cancer     right breast  . Wears glasses   . Full dentures   . Dysrhythmia 10/15    AF   Past Surgical History  Procedure Laterality Date  . Abdominal hysterectomy    . Portacath placement N/A 11/15/2013    Procedure: INSERTION PORT-A-CATH WITH ULTRA SOUND AND FLOROSCOPY;  Surgeon: [name], MD;  Location: [facility];  Service: General;  Laterality: N/A;  . Eye surgery  12/22/2009    cataracts  . Total knee arthroplasty  2002    left  . Port-a-cath removal Right 01/09/2014    Procedure: REMOVAL PORT-A-CATH;  Surgeon: [name], MD;  Location: [facility];  Service: General;  Laterality: Right;  . Breast lumpectomy with radioactive seed localization Right 01/09/2014    Procedure: RIGHT BREAST SEED LOCALIZED LUMPECTOMY ;  Surgeon: [name], MD;  Location: [facility];  Service: General;  Laterality: Right;  . Axillary lymph node dissection Right 01/09/2014    Procedure: RIGHT AXILLARY LYMPH NODE DISECTION;  Surgeon: [name], MD;  Location: [facility];  Service:  General;  Laterality: Right;   Current Outpatient Prescriptions on File Prior to Visit  Medication Sig Dispense Refill  . amiodarone (PACERONE) 200 MG tablet Take 1 tablet (200 mg total) by mouth daily. 30 tablet 3  . atorvastatin (LIPITOR) 10 MG tablet Take 10 mg by mouth daily.    . Cholecalciferol (VITAMIN D) 2000 UNITS CAPS Take 2,000 Units by mouth daily.    . ferrous sulfate 325 (65 FE) MG EC tablet Take 325 mg by mouth daily with breakfast.    . furosemide (LASIX) 40 MG tablet Take 40 mg by mouth daily.     Marland Kitchen LORazepam (ATIVAN) 0.5 MG tablet Take 1 tablet (0.5 mg total) by mouth every 6 (six) hours as needed (Nausea or vomiting). 30 tablet 0  . metFORMIN (GLUCOPHAGE-XR) 500 MG 24 hr tablet Take 500-1,000 mg by mouth 2 (two) times daily. Take 500 mg every morning and 1000 mg every evening    . metoprolol (LOPRESSOR) 100 MG tablet Take 1 tablet (100 mg total) by mouth 2 (two) times daily. 60 tablet 1  . ondansetron (ZOFRAN) 8 MG tablet Take 1 tablet (8 mg total) by mouth 2 (two) times daily as needed. Start on the third day after chemotherapy. 30 tablet 1  . oxyCODONE-acetaminophen (ROXICET) 5-325 MG per tablet Take 1-2 tablets by mouth every 4 (four) hours as  needed. 30 tablet 0  . pioglitazone (ACTOS) 30 MG tablet Take 1 tablet (30 mg total) by mouth daily. 90 tablet 0  . Polyethyl Glycol-Propyl Glycol (SYSTANE OP) Place 1 drop into both eyes 3 (three) times daily.    . potassium chloride SA (K-DUR,KLOR-CON) 20 MEQ tablet Take 20 mEq by mouth daily.    . prochlorperazine (COMPAZINE) 10 MG tablet Take 1 tablet (10 mg total) by mouth every 6 (six) hours as needed (Nausea or vomiting). 30 tablet 1  . SYNTHROID 50 MCG tablet TAKE 1 TABLET (50 MCG TOTAL) BY MOUTH DAILY BEFORE BREAKFAST. 90 tablet 0  . vitamin B-12 (CYANOCOBALAMIN) 1000 MCG tablet Take 1,000 mcg by mouth daily.     No current facility-administered medications on file prior to visit.   No Known Allergies History   Social  History  . Marital Status: Widowed    Spouse Name: N/A    Number of Children: N/A  . Years of Education: N/A   Occupational History  . Not on file.   Social History Main Topics  . Smoking status: Former Smoker -- 1.00 packs/day for 5 years    Quit date: 11/14/1958  . Smokeless tobacco: Never Used  . Alcohol Use: No  . Drug Use: No  . Sexual Activity: No   Other Topics Concern  . Not on file   Social History Narrative      Review of Systems  All other systems reviewed and are negative.      Objective:   Physical Exam  Constitutional: She appears well-developed and well-nourished.  Cardiovascular: Normal rate, regular rhythm and normal heart sounds.   No murmur heard. Pulmonary/Chest: Effort normal and breath sounds normal. No respiratory distress. She has no wheezes. She has no rales.  Abdominal: Soft. Bowel sounds are normal.  Musculoskeletal: She exhibits edema.  Vitals reviewed.         Assessment & Plan:  Diabetes mellitus type II, controlled  Peripheral edema  I want the patient to discontinue Actos. I want her to reduce her Lasix to 40 mg a day due to dehydration. I will replace her Actos with Tradjenta 5 mg poqday.  Recheck hemoglobin A1c in January. Last hemoglobin A1c was 7 in September.

## 2014-01-30 ENCOUNTER — Ambulatory Visit (HOSPITAL_BASED_OUTPATIENT_CLINIC_OR_DEPARTMENT_OTHER): Payer: Medicare Other | Admitting: Hematology and Oncology

## 2014-01-30 ENCOUNTER — Ambulatory Visit (INDEPENDENT_AMBULATORY_CARE_PROVIDER_SITE_OTHER): Payer: Medicare Other | Admitting: Cardiology

## 2014-01-30 ENCOUNTER — Telehealth: Payer: Self-pay | Admitting: Hematology and Oncology

## 2014-01-30 ENCOUNTER — Encounter: Payer: Self-pay | Admitting: Cardiology

## 2014-01-30 VITALS — BP 160/82 | HR 68 | Ht 66.0 in | Wt 195.3 lb

## 2014-01-30 DIAGNOSIS — R5383 Other fatigue: Secondary | ICD-10-CM

## 2014-01-30 DIAGNOSIS — Z79899 Other long term (current) drug therapy: Secondary | ICD-10-CM

## 2014-01-30 DIAGNOSIS — I10 Essential (primary) hypertension: Secondary | ICD-10-CM

## 2014-01-30 DIAGNOSIS — C50411 Malignant neoplasm of upper-outer quadrant of right female breast: Secondary | ICD-10-CM

## 2014-01-30 DIAGNOSIS — I480 Paroxysmal atrial fibrillation: Secondary | ICD-10-CM

## 2014-01-30 DIAGNOSIS — E038 Other specified hypothyroidism: Secondary | ICD-10-CM

## 2014-01-30 DIAGNOSIS — Z7901 Long term (current) use of anticoagulants: Secondary | ICD-10-CM | POA: Insufficient documentation

## 2014-01-30 DIAGNOSIS — I5032 Chronic diastolic (congestive) heart failure: Secondary | ICD-10-CM

## 2014-01-30 DIAGNOSIS — Z170 Estrogen receptor positive status [ER+]: Secondary | ICD-10-CM

## 2014-01-30 DIAGNOSIS — C773 Secondary and unspecified malignant neoplasm of axilla and upper limb lymph nodes: Secondary | ICD-10-CM

## 2014-01-30 NOTE — Assessment & Plan Note (Signed)
On Xarelto.  She denies any bleeding from her bowels or her urine. Continue Xarelto

## 2014-01-30 NOTE — Assessment & Plan Note (Signed)
Maintaining sinus rhythm on amiodarone. Will check thyroid function issues been on amiodarone now for greater than a month.

## 2014-01-30 NOTE — Progress Notes (Signed)
01/30/2014   PCP: Odette Fraction, MD   Chief Complaint  Patient presents with  . Hospitalization Follow-up    post hospital for irregular heart beat, pt denied chest pain and SOB    Primary Cardiologist: Dr. Meda Coffee  HPI:    80 year old female that was seen in the hospital after she presented with A. Fib with RVR.  She has a history of stage II B. right-sided breast cancer and she is being treated with neoadjuvant chemotherapy with dose dense Adriamycin and Cytoxan. She received cycle 2 of chemotherapy on 11/28/2013. She tolerated this treatment extremely well. She was seen for a follow up visit on 12/03/2013 and felt fine without any problems or concerns. She called in on 12/08/2013 complaining of low-grade temperature. She was being admitted with neutropenic fever.   She tells me today she was not aware of her heart racing. She was placed on IV dilt and amiodarone and converted to sinus rhythm.  She is now on amiodarone 200 mg daily and she has had no awareness of rapid heartbeat.  She has been hospitalized again and had surgery for the breast cancer and is planning to have radiation therapy.  2-D echo was done in September of this year EF was 60-65% mild LVH mild to trace aortic regurg mild mitral regurg. Her PA peak pressure was 49 mmHg. She also had moderate tricuspid regurg.  She is back for follow-up she does complain of lower extremity edema and her blood pressure is elevated today.  Her actos was recently stopped thought to have been adding to her edema.  No Known Allergies  Current Outpatient Prescriptions  Medication Sig Dispense Refill  . amiodarone (PACERONE) 200 MG tablet Take 1 tablet (200 mg total) by mouth daily. 30 tablet 3  . amLODipine (NORVASC) 5 MG tablet Take 5 mg by mouth daily.  5  . atorvastatin (LIPITOR) 10 MG tablet Take 10 mg by mouth daily.    . Cholecalciferol (VITAMIN D) 2000 UNITS CAPS Take 2,000 Units by mouth daily.    Marland Kitchen  dexamethasone (DECADRON) 4 MG tablet   1  . ferrous sulfate 325 (65 FE) MG EC tablet Take 325 mg by mouth daily with breakfast.    . furosemide (LASIX) 40 MG tablet Take 40 mg by mouth daily.     Marland Kitchen linagliptin (TRADJENTA) 5 MG TABS tablet Take 5 mg by mouth daily.    Marland Kitchen lisinopril-hydrochlorothiazide (PRINZIDE,ZESTORETIC) 20-12.5 MG per tablet Take 1 tablet by mouth daily.  5  . LORazepam (ATIVAN) 0.5 MG tablet Take 1 tablet (0.5 mg total) by mouth every 6 (six) hours as needed (Nausea or vomiting). 30 tablet 0  . metFORMIN (GLUCOPHAGE-XR) 500 MG 24 hr tablet Take 500-1,000 mg by mouth 2 (two) times daily. Take 500 mg every morning and 1000 mg every evening    . metoprolol (LOPRESSOR) 100 MG tablet Take 1 tablet (100 mg total) by mouth 2 (two) times daily. 60 tablet 1  . ondansetron (ZOFRAN) 8 MG tablet Take 1 tablet (8 mg total) by mouth 2 (two) times daily as needed. Start on the third day after chemotherapy. 30 tablet 1  . oxyCODONE-acetaminophen (ROXICET) 5-325 MG per tablet Take 1-2 tablets by mouth every 4 (four) hours as needed. 30 tablet 0  . Polyethyl Glycol-Propyl Glycol (SYSTANE OP) Place 1 drop into both eyes 3 (three) times daily.    . potassium chloride SA (K-DUR,KLOR-CON) 20 MEQ tablet Take 20 mEq by  mouth daily.    . prochlorperazine (COMPAZINE) 10 MG tablet Take 1 tablet (10 mg total) by mouth every 6 (six) hours as needed (Nausea or vomiting). 30 tablet 1  . rivaroxaban (XARELTO) 20 MG TABS tablet Take 20 mg by mouth daily with supper.    Marland Kitchen SYNTHROID 50 MCG tablet TAKE 1 TABLET (50 MCG TOTAL) BY MOUTH DAILY BEFORE BREAKFAST. 90 tablet 0  . vitamin B-12 (CYANOCOBALAMIN) 1000 MCG tablet Take 1,000 mcg by mouth daily.     No current facility-administered medications for this visit.    Past Medical History  Diagnosis Date  . Hypertension   . Hyperlipidemia   . Diabetes mellitus without complication   . Thyroid disease     hypothyroidism  . Anemia   . Former smoker   .  Multinodular goiter   . Complication of anesthesia     slow to wake up  . Cancer     right breast  . Wears glasses   . Full dentures   . Dysrhythmia 10/15    AF    Past Surgical History  Procedure Laterality Date  . Abdominal hysterectomy    . Portacath placement N/A 11/15/2013    Procedure: INSERTION PORT-A-CATH WITH ULTRA SOUND AND FLOROSCOPY;  Surgeon: [name], MD;  Location: [facility];  Service: General;  Laterality: N/A;  . Eye surgery  12/22/2009    cataracts  . Total knee arthroplasty  2002    left  . Port-a-cath removal Right 01/09/2014    Procedure: REMOVAL PORT-A-CATH;  Surgeon: [name], MD;  Location: [employer] [facility];  Service: General;  Laterality: Right;  . Breast lumpectomy with radioactive seed localization Right 01/09/2014    Procedure: RIGHT BREAST SEED LOCALIZED LUMPECTOMY ;  Surgeon: [name], MD;  Location: [facility];  Service: General;  Laterality: Right;  . Axillary lymph node dissection Right 01/09/2014    Procedure: RIGHT AXILLARY LYMPH NODE DISECTION;  Surgeon: [name], MD;  Location: [facility];  Service: General;  Laterality: Right;    HYI:FOYDXAJ:OI colds or fevers, no weight changes Skin:no rashes or ulcers HEENT:no blurred vision, no congestion CV:see HPI PUL:see HPI GI:no diarrhea constipation or melena, no indigestion GU:no hematuria, no dysuria MS:no joint pain, no claudication Neuro:no syncope, no lightheadedness Endo:+ diabetes stable , no thyroid disease  Wt Readings from Last 3 Encounters:  01/30/14 195 lb 4.8 oz (88.587 kg)  01/30/14 196 lb (88.905 kg)  01/26/14 196 lb (88.905 kg)    PHYSICAL EXAM BP 160/82 mmHg  Pulse 68  Ht 5\' 6"  (1.676 m)  Wt 195 lb 4.8 oz (88.587 kg)  BMI 31.54 kg/m2 General:Pleasant affect, NAD Skin:Warm and dry, brisk capillary refill HEENT:normocephalic, sclera clear, mucus membranes moist Neck:supple, no JVD, no bruits  Heart:S1S2 RRR with  soft 1/6 systolic murmur, no gallup, rub or click Lungs:clear without rales, rhonchi, or wheezes NOM:VEHMC, soft, non tender, + BS, do not palpate liver spleen or masses Ext:2-3+ lower ext edema, 2+ pedal pulses, 2+ radial pulses Neuro:alert and oriented, MAE, follows commands, + facial symmetry  EKG:SR rate of 68 no acute changes from October 2015  ASSESSMENT AND PLAN PAF (paroxysmal atrial fibrillation) Maintaining sinus rhythm on amiodarone. Will check thyroid function issues been on amiodarone now for greater than a month.  Chronic diastolic CHF (congestive heart failure), NYHA class 2 BP is elevated today with volume overload of her lower extremity edema. Currently on Lasix 40 once a day. We'll increase to 80  daily for 4 days and then go back to 40 mg daily. Perhaps getting rid of the fluid in addition to having stopped the Actos will resolve her volume overload.  Will check basic metabolic panel today as well and hepatic panel.  Essential hypertension, benign Elevated today may be due to her volume overload we'll attempt diuresing.  Anticoagulated On Xarelto.  She denies any bleeding from her bowels or her urine. Continue Xarelto  Breast cancer of upper-outer quadrant of right female breast Plans for radiation in the near future.   She will follow with Dr. Meda Coffee in 4 weeks.

## 2014-01-30 NOTE — Assessment & Plan Note (Signed)
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 ER positive PR positive HER-2 negative Ki-67 11%.  Pathology counseling:I discussed the pathology report in great detail including the size lymph node status and clinical staging as well as the significance of ER PR receptors. She will be presented at the multidisciplinary tumor board. I did not recommend further systemic chemotherapy because of her prior experience with chemotherapy that lead to pseudomonal sepsis. He is also low likelihood of response to treatment given the low Ki 67. I would recommend adjuvant radiation therapy followed by antiestrogen therapy for adjuvant treatment. I discussed with her that her breast cancer is a very high risk of recurrence based on the 5 positive lymph nodes. Her risk of recurrence would be reduced by 40% by using antiestrogen therapy.  Recommendation: Adjuvant radiation therapy followed by adjuvant antiestrogen therapy  I will request radiation oncology consultation and I will see her back mid-January to discuss adjuvant antiestrogen treatment.

## 2014-01-30 NOTE — Progress Notes (Signed)
Patient Care Team: [name], MD as PCP - General (Family Medicine) [name], MD as Consulting Physician (Hematology and Oncology) [name], MD as Consulting Physician (General Surgery) [name], MD as Consulting Physician (Radiation Oncology)  DIAGNOSIS: Breast cancer of upper-outer quadrant of right female breast   Staging form: Breast, AJCC 7th Edition     Clinical: Stage IIB (T2, N1, cM0) - Signed by [name], MD on 11/01/2013     Pathologic: Stage IIIA (T1c, N2a, cM0) - Unsigned   SUMMARY OF ONCOLOGIC HISTORY:   Breast cancer of upper-outer quadrant of right female breast   10/20/2013 Mammogram Ultrasound and mammogram showed 2.1 x 2.1 x 1.9 cm right breast mass   10/20/2013 Initial Diagnosis Breast cancer of upper-outer quadrant of right female breast. Invasive ductal cancer with lymphovascular invasion one lymph node biopsy that was positive for cancer grade 1; Her 2 Neg Ratio 0.96, ER100% PR 8% positive Ki 67: 11%;   10/31/2013 Breast MRI Right breast upper outer quadrant: 3.1 x 1.9 x 2.2 cm: Right axillary lymph node 1.1 x 0.9 x 0.6 cm   11/16/2013 - 12/01/2013 Chemotherapy Neoadjuvant dose dense Doxorubicin and Cyclophosphamide given on day 1 of a 14 day cycle with Neulasta given on day 2 for granulocyte support   01/09/2014 Surgery Right breast lumpectomy: Invasive ductal carcinoma, grade 2, 1.8 cm with DCIS intermediate grade, lymphovascular invasion diffusely, 5 of 13 lymph nodes positive with extracapsular extension, ER positive, PR 80%, HER-2 negative ratio 0.96, Ki-67 11%    CHIEF COMPLIANT: Followup after surgery for breast cancer.  INTERVAL HISTORY: Whitney [name] is a 80 year old Caucasian with above-mentioned history of right-sided breast cancer. We give her neoadjuvant chemotherapy with 2 cycles of dose dense Adriamycin and Cytoxan. After the second cycle she was admitted to the hospital with severe sepsis with Pseudomonas. She had atrial  fibrillation at the same time. Because of her multiple illnesses, we elected to discontinue further chemotherapy and she underwent surgery with a right-sided lumpectomy and axillary lymph node dissection. She is here today to discuss the results of the surgery and to discuss further treatment options. She is unremarkably well from the surgery. She is fairly fit and doing well. Her heart is also regular sinus rhythm.  REVIEW OF SYSTEMS:   Constitutional: Denies fevers, chills or abnormal weight loss Eyes: Denies blurriness of vision Ears, nose, mouth, throat, and face: Denies mucositis or sore throat Respiratory: Denies cough, dyspnea or wheezes Cardiovascular: Denies palpitation, chest discomfort or lower extremity swelling Gastrointestinal:  Denies nausea, heartburn or change in bowel habits Skin: Denies abnormal skin rashes Lymphatics: Denies new lymphadenopathy or easy bruising Neurological:Denies numbness, tingling or new weaknesses Behavioral/Psych: Mood is stable, no new changes  Breast: soreness from recent surgery. All other systems were reviewed with the patient and are negative.  I have reviewed the past medical history, past surgical history, social history and family history with the patient and they are unchanged from previous note.  ALLERGIES:  has No Known Allergies.  MEDICATIONS:  Current Outpatient Prescriptions  Medication Sig Dispense Refill  . amiodarone (PACERONE) 200 MG tablet Take 1 tablet (200 mg total) by mouth daily. 30 tablet 3  . atorvastatin (LIPITOR) 10 MG tablet Take 10 mg by mouth daily.    . Cholecalciferol (VITAMIN D) 2000 UNITS CAPS Take 2,000 Units by mouth daily.    . ferrous sulfate 325 (65 FE) MG EC tablet Take 325 mg by mouth daily with breakfast.    .  furosemide (LASIX) 40 MG tablet Take 40 mg by mouth daily.     Marland Kitchen LORazepam (ATIVAN) 0.5 MG tablet Take 1 tablet (0.5 mg total) by mouth every 6 (six) hours as needed (Nausea or vomiting). 30 tablet 0   . metFORMIN (GLUCOPHAGE-XR) 500 MG 24 hr tablet Take 500-1,000 mg by mouth 2 (two) times daily. Take 500 mg every morning and 1000 mg every evening    . metoprolol (LOPRESSOR) 100 MG tablet Take 1 tablet (100 mg total) by mouth 2 (two) times daily. 60 tablet 1  . ondansetron (ZOFRAN) 8 MG tablet Take 1 tablet (8 mg total) by mouth 2 (two) times daily as needed. Start on the third day after chemotherapy. 30 tablet 1  . oxyCODONE-acetaminophen (ROXICET) 5-325 MG per tablet Take 1-2 tablets by mouth every 4 (four) hours as needed. 30 tablet 0  . pioglitazone (ACTOS) 30 MG tablet Take 1 tablet (30 mg total) by mouth daily. 90 tablet 0  . Polyethyl Glycol-Propyl Glycol (SYSTANE OP) Place 1 drop into both eyes 3 (three) times daily.    . potassium chloride SA (K-DUR,KLOR-CON) 20 MEQ tablet Take 20 mEq by mouth daily.    . prochlorperazine (COMPAZINE) 10 MG tablet Take 1 tablet (10 mg total) by mouth every 6 (six) hours as needed (Nausea or vomiting). 30 tablet 1  . rivaroxaban (XARELTO) 20 MG TABS tablet Take 20 mg by mouth daily with supper.    Marland Kitchen SYNTHROID 50 MCG tablet TAKE 1 TABLET (50 MCG TOTAL) BY MOUTH DAILY BEFORE BREAKFAST. 90 tablet 0  . vitamin B-12 (CYANOCOBALAMIN) 1000 MCG tablet Take 1,000 mcg by mouth daily.     No current facility-administered medications for this visit.    PHYSICAL EXAMINATION: ECOG PERFORMANCE STATUS: 1 - Symptomatic but completely ambulatory  Filed Vitals:   01/30/14 1008  BP: 160/74  Pulse: 64  Temp: 98.4 F (36.9 C)  Resp: 18   Filed Weights   01/30/14 1008  Weight: 196 lb (88.905 kg)    GENERAL:alert, no distress and comfortable SKIN: skin color, texture, turgor are normal, no rashes or significant lesions EYES: normal, Conjunctiva are pink and non-injected, sclera clear OROPHARYNX:no exudate, no erythema and lips, buccal mucosa, and tongue normal  NECK: supple, thyroid normal size, non-tender, without nodularity LYMPH:  no palpable  lymphadenopathy in the cervical, axillary or inguinal LUNGS: clear to auscultation and percussion with normal breathing effort HEART: regular rate & rhythm and no murmurs and no lower extremity edema ABDOMEN:abdomen soft, non-tender and normal bowel sounds Musculoskeletal:no cyanosis of digits and no clubbing  NEURO: alert & oriented x 3 with fluent speech, no focal motor/sensory deficits   LABORATORY DATA:  I have reviewed the data as listed   Chemistry      Component Value Date/Time   NA 140 01/08/2014 0930   NA 141 12/22/2013 1000   K 4.0 01/08/2014 0930   K 3.1* 12/22/2013 1000   CL 100 01/08/2014 0930   CO2 28 01/08/2014 0930   CO2 30* 12/22/2013 1000   BUN 17 01/08/2014 0930   BUN 26.0 12/22/2013 1000   CREATININE 1.13* 01/08/2014 0930   CREATININE 0.87 01/01/2014 1524   CREATININE 1.3* 12/22/2013 1000      Component Value Date/Time   CALCIUM 9.7 01/08/2014 0930   CALCIUM 10.0 12/22/2013 1000   ALKPHOS 58 01/08/2014 0930   ALKPHOS 66 12/22/2013 1000   AST 16 01/08/2014 0930   AST 11 12/22/2013 1000   ALT 12 01/08/2014 0930  ALT 8 12/22/2013 1000   BILITOT 0.3 01/08/2014 0930   BILITOT 0.29 12/22/2013 1000       Lab Results  Component Value Date   WBC 5.4 01/08/2014   HGB 9.9* 01/08/2014   HCT 31.5* 01/08/2014   MCV 90.5 01/08/2014   PLT 262 01/08/2014   NEUTROABS 3.8 01/08/2014    ASSESSMENT & PLAN:  Breast cancer of upper-outer quadrant of right female breast T1 CN2A M0 stage IIIa invasive ductal carcinoma right breast status post lumpectomy 5/13 lymph nodes positive with lymphovascular invasion and extracapsular extension. ER positive PR positive HER-2 negative Ki-67 11%.  Pathology counseling:I discussed the pathology report in great detail including the size lymph node status and clinical staging as well as the significance of ER PR receptors. She will be presented at the multidisciplinary tumor board. I did not recommend further systemic chemotherapy  because of her prior experience with chemotherapy that lead to pseudomonal sepsis. He is also low likelihood of response to treatment given the low Ki 67. I would recommend adjuvant radiation therapy followed by antiestrogen therapy for adjuvant treatment. I discussed with her that her breast cancer is a very high risk of recurrence based on the 5 positive lymph nodes. Her risk of recurrence would be reduced by 40% by using antiestrogen therapy.  Recommendation: Adjuvant radiation therapy followed by adjuvant antiestrogen therapy  I will request radiation oncology consultation and I will see her back mid-January to discuss adjuvant antiestrogen treatment.    Orders Placed This Encounter  Procedures  . Ambulatory referral to Radiation Oncology    Referral Priority:  Routine    Referral Type:  Consultation    Referral Reason:  Specialty Services Required    Requested Specialty:  Radiation Oncology    Number of Visits Requested:  1   The patient has a good understanding of the overall plan. she agrees with it. She will call with any problems that may develop before her next visit here.   [name], MD 01/30/2014 11:45 AM

## 2014-01-30 NOTE — Assessment & Plan Note (Signed)
Elevated today may be due to her volume overload we'll attempt diuresing.

## 2014-01-30 NOTE — Patient Instructions (Addendum)
Your physician recommends that you schedule a follow-up appointment in: 4 weeks with Dr Meda Coffee  Your physician has recommended you make the following change in your medication: Take 2 tablets of furosemide daily for 4 day then back to 1 tablets a day  Your physician recommends that you return for lab work today

## 2014-01-30 NOTE — Assessment & Plan Note (Signed)
Plans for radiation in the near future.

## 2014-01-30 NOTE — Assessment & Plan Note (Signed)
BP is elevated today with volume overload of her lower extremity edema. Currently on Lasix 40 once a day. We'll increase to 80 daily for 4 days and then go back to 40 mg daily. Perhaps getting rid of the fluid in addition to having stopped the Actos will resolve her volume overload.  Will check basic metabolic panel today as well and hepatic panel.

## 2014-01-30 NOTE — Telephone Encounter (Signed)
s.w. pt grand-daughter Pt sch3ed to see Dr. Pablo Ledger on 12.2 @ 10am..the patient aware...pt also aware of jan 2016 appt

## 2014-01-30 NOTE — Addendum Note (Signed)
Addended by: Prentiss Bells on: 01/30/2014 12:36 PM   Modules accepted: Medications

## 2014-01-31 ENCOUNTER — Ambulatory Visit: Payer: Medicare Other | Admitting: Cardiology

## 2014-01-31 ENCOUNTER — Other Ambulatory Visit: Payer: Self-pay | Admitting: Endocrinology

## 2014-01-31 LAB — BASIC METABOLIC PANEL
BUN: 20 mg/dL (ref 6–23)
CO2: 30 mEq/L (ref 19–32)
Calcium: 9.3 mg/dL (ref 8.4–10.5)
Chloride: 102 mEq/L (ref 96–112)
Creat: 1.17 mg/dL — ABNORMAL HIGH (ref 0.50–1.10)
Glucose, Bld: 168 mg/dL — ABNORMAL HIGH (ref 70–99)
Potassium: 4.2 mEq/L (ref 3.5–5.3)
Sodium: 143 mEq/L (ref 135–145)

## 2014-01-31 LAB — HEPATIC FUNCTION PANEL
ALT: 12 U/L (ref 0–35)
AST: 15 U/L (ref 0–37)
Albumin: 3.8 g/dL (ref 3.5–5.2)
Alkaline Phosphatase: 47 U/L (ref 39–117)
Bilirubin, Direct: 0.1 mg/dL (ref 0.0–0.3)
Indirect Bilirubin: 0.3 mg/dL (ref 0.2–1.2)
Total Bilirubin: 0.4 mg/dL (ref 0.2–1.2)
Total Protein: 6.1 g/dL (ref 6.0–8.3)

## 2014-01-31 LAB — TSH: TSH: 0.549 u[IU]/mL (ref 0.350–4.500)

## 2014-02-05 ENCOUNTER — Other Ambulatory Visit: Payer: Self-pay | Admitting: *Deleted

## 2014-02-05 DIAGNOSIS — I5032 Chronic diastolic (congestive) heart failure: Secondary | ICD-10-CM

## 2014-02-08 ENCOUNTER — Other Ambulatory Visit: Payer: Self-pay | Admitting: Endocrinology

## 2014-02-09 ENCOUNTER — Other Ambulatory Visit: Payer: Self-pay | Admitting: Endocrinology

## 2014-02-13 ENCOUNTER — Encounter: Payer: Self-pay | Admitting: Radiation Oncology

## 2014-02-13 NOTE — Progress Notes (Signed)
Location of Breast Cancer: right upper outer  Histology per Pathology Report:  01/09/14 Diagnosis 1. Breast, lumpectomy, right - INVASIVE DUCTAL CARCINOMA, GRADE II/III, SPANNING 1.8 CM. - DUCTAL CARCINOMA IN SITU, INTERMEDIATE GRADE - LYMPHOVASCULAR INVASION IS IDENTIFIED, DIFFUSE. - THE SURGICAL RESECTION MARGINS ARE NEGATIVE FOR CARCINOMA. - SEE ONCOLOGY TABLE BELOW. 2. Lymph nodes, regional resection, right axillary - METASTATIC CARCINOMA IN 5 OF 13 LYMPH NODES (5/13). - EXTRACAPSULAR EXTENSION IS IDENTIFIED.  10/23/13 Diagnosis 1. Breast, right, needle core biopsy, mass, 10 o'clock - INVASIVE DUCTAL CARCINOMA, SEE COMMENT. - POSITIVE FOR LYMPH VASCULAR INVASION. - CALCIFICATIONS IDENTIFIED. 2. Lymph node, needle/core biopsy, axillary - ONE LYMPH NODE, POSITIVE FOR METASTATIC MAMMARY CARCINOMA (1/1).  Receptor Status: ER(100%), PR (8%), Her2-neu (-)  Did patient present with symptoms (if so, please note symptoms) or was this found on screening mammography?: she palpated a mass  Past/Anticipated interventions by surgeon, if any: lumpectomy, node sampling  Past/Anticipated interventions by medical oncology, if any: Chemotherapy Dr Lindi Adie: We give her neoadjuvant chemotherapy with 2 cycles of dose dense Adriamycin and Cytoxan. After the second cycle she was admitted to the hospital with severe sepsis with Pseudomonas. I did not recommend further systemic chemotherapy because of her prior experience with chemotherapy that lead to pseudomonal sepsis. He is also low likelihood of response to treatment given the low Ki 67. I would recommend adjuvant radiation therapy followed by antiestrogen therapy for adjuvant treatment.   Lymphedema issues, if any: No     Pain issues, if any: No    SAFETY ISSUES:  Prior radiation? no  Pacemaker/ICD? No  Possible current pregnancy? no  Is the patient on methotrexate? no  Current Complaints / other details:  menarche age 59, P43, first birth  age 16, no HRT, menopause age 75.Widowed. Granddaughter lives with her. NKDA Quit smoking in 1960.    Andria Rhein, RN 02/13/2014,2:25 PM

## 2014-02-14 ENCOUNTER — Ambulatory Visit
Admission: RE | Admit: 2014-02-14 | Discharge: 2014-02-14 | Disposition: A | Discharge status: Home / Self Care | Payer: Medicare Other | Source: Ambulatory Visit | Attending: Radiation Oncology | Admitting: Radiation Oncology

## 2014-02-14 ENCOUNTER — Encounter: Payer: Self-pay | Admitting: Radiation Oncology

## 2014-02-14 VITALS — BP 174/75 | HR 64 | Temp 98.4°F | Wt 194.4 lb

## 2014-02-14 DIAGNOSIS — Z9221 Personal history of antineoplastic chemotherapy: Secondary | ICD-10-CM | POA: Insufficient documentation

## 2014-02-14 DIAGNOSIS — C50411 Malignant neoplasm of upper-outer quadrant of right female breast: Secondary | ICD-10-CM | POA: Insufficient documentation

## 2014-02-14 DIAGNOSIS — Z170 Estrogen receptor positive status [ER+]: Secondary | ICD-10-CM | POA: Diagnosis not present

## 2014-02-14 DIAGNOSIS — C50412 Malignant neoplasm of upper-outer quadrant of left female breast: Secondary | ICD-10-CM

## 2014-02-14 HISTORY — DX: Malignant neoplasm of unspecified site of unspecified female breast: C50.919

## 2014-02-14 NOTE — Progress Notes (Signed)
   Department of Radiation Oncology  Phone:  [phone] Fax:        [phone]   Name: Whitney [name] MRN: [id number]  DOB: 07/16/1927  Date: 02/14/2014  Follow Up Visit Note  Diagnosis: Breast cancer of upper-outer quadrant of right female breast   Staging form: Breast, AJCC 7th Edition     Clinical: Stage IIB (T2, N1, cM0) - Signed by [name], MD on 11/01/2013     Pathologic: Stage IIIA (T1c, N2a, cM0) - Unsigned  Interval History: Whitney [name] presents today for routine followup.  She had 2 cycles of AC and was hospitalized for pseudomonas sepsis.  She survived and no further chemotherpay was givine.  She is feeling well now and is ready for radiation.  She has discussed antiestrogen treatment with Dr. Lindi Adie.  Her granddaughter accompanies her.    Physical Exam:  Filed Vitals:   02/14/14 1017  BP: 174/75  Pulse: 64  Temp: 98.4 F (36.9 C)  Weight: 194 lb 6.4 oz (88.179 kg)  Pleasant female. Alert and oriented. Appears younger than her stated age.  Walks unassisted. Good range of motion of right arm. Well healed lumpectomy incision.   IMPRESSION: Whitney [name] is a 80 y.o. female s/p lumpectomy and ALND with positive lymph nodes and adjuvant chemotherapy completed early.   PLAN:  I spoke to the patient today regarding her diagnosis and options for treatment. We discussed the equivalence in terms of survival and local failure between mastectomy and breast conservation. We discussed the role of radiation in decreasing local failures in patients who undergo lumpectomy. We discussed the process of simulation and the placement tattoos. We discussed 6 weeks of treatment as an outpatient. We discussed the possibility of asymptomatic lung damage. We discussed the low likelihood of secondary malignancies. We discussed the possible side effects including but not limited to skin redness, fatigue, permanent skin darkening, and breast swelling.   We discussed treatemtn of her breast,  SLCV fossa and axilla given that she had >25% positive lymph nodes.   She would like to start after Christmas which is reasonable. I've scheduled her for simulation on 12/15 and she has signed informed consent.  She is not a candidate for the LN study as she only received 2 cycles of chemotherapy.        [name], MD

## 2014-02-14 NOTE — Progress Notes (Signed)
Please see the Nurse Progress Note in the MD Initial Consult Encounter for this patient. 

## 2014-02-16 ENCOUNTER — Encounter: Payer: Self-pay | Admitting: Endocrinology

## 2014-02-16 ENCOUNTER — Other Ambulatory Visit: Payer: Self-pay | Admitting: *Deleted

## 2014-02-16 ENCOUNTER — Ambulatory Visit (INDEPENDENT_AMBULATORY_CARE_PROVIDER_SITE_OTHER): Payer: Medicare Other | Admitting: Endocrinology

## 2014-02-16 VITALS — BP 152/90 | HR 62 | Temp 98.0°F | Resp 16 | Ht 66.0 in | Wt 192.4 lb

## 2014-02-16 DIAGNOSIS — E278 Other specified disorders of adrenal gland: Secondary | ICD-10-CM

## 2014-02-16 DIAGNOSIS — E119 Type 2 diabetes mellitus without complications: Secondary | ICD-10-CM

## 2014-02-16 DIAGNOSIS — R609 Edema, unspecified: Secondary | ICD-10-CM

## 2014-02-16 DIAGNOSIS — E279 Disorder of adrenal gland, unspecified: Secondary | ICD-10-CM

## 2014-02-16 DIAGNOSIS — I10 Essential (primary) hypertension: Secondary | ICD-10-CM

## 2014-02-16 NOTE — Progress Notes (Signed)
Patient ID: Whitney [name], female   DOB: 1927/10/02, 80 y.o.   MRN: [id number]   Reason for Appointment: Diabetes and other issues  History of Present Illness    Type 2 DIABETES MELITUS, date of diagnosis:  1983     Previous history: She had been taking metformin and Actos for several years.  She usually has had excellent control with upper normal A1c  Recent history: Her metformin was reduced from her dose of 2000 mg a day to 1500 mg because of her age.  Is tolerating this  Previously was taking Actos without side effects like edema. She was last seen in 9/15 Recently seen by her PCP on 01/26/14 and he stopped her Actos and give her samples of Tradjenta because of leg edema However the patient says that her blood sugars are higher and yesterday was 240 fasting Did not bring her monitor for review Her last A1c was mildly increased at 7.1 but adequate for her age Not able to do much exercise and her weight is stable       Monitors blood glucose: Once a day.    Glucometer: One Touch.          Blood Glucose readings from recall: Variable, generally 140 or so but occasionally over 200 especially recently  Hypoglycemia:  none        Meals: 3 meals per day.          Physical activity: exercise: Only walking around the house           Dietician visit: Most recent:?         Complications: are: None   Wt Readings from Last 3 Encounters:  02/14/14 194 lb 6.4 oz (88.179 kg)  01/30/14 195 lb 4.8 oz (88.587 kg)  01/30/14 196 lb (88.905 kg)    LABS:  Lab Results  Component Value Date   HGBA1C 7.1* 12/08/2013   HGBA1C 6.1* 07/27/2013   HGBA1C 6.0 04/07/2013   Lab Results  Component Value Date   MICROALBUR 10.1* 08/09/2013   LDLCALC 66 04/07/2013   CREATININE 1.17* 01/30/2014         Medication List       This list is accurate as of: 02/16/14  9:33 AM.  Always use your most recent med list.               amiodarone 200 MG tablet  Commonly known as:  PACERONE  Take  1 tablet (200 mg total) by mouth daily.     amLODipine 5 MG tablet  Commonly known as:  NORVASC  Take 5 mg by mouth daily.     atorvastatin 10 MG tablet  Commonly known as:  LIPITOR  Take 10 mg by mouth daily.     dexamethasone 4 MG tablet  Commonly known as:  DECADRON     ferrous sulfate 325 (65 FE) MG EC tablet  Take 325 mg by mouth daily with breakfast.     furosemide 40 MG tablet  Commonly known as:  LASIX  Take 40 mg by mouth daily.     linagliptin 5 MG Tabs tablet  Commonly known as:  TRADJENTA  Take 5 mg by mouth daily.     lisinopril-hydrochlorothiazide 20-12.5 MG per tablet  Commonly known as:  PRINZIDE,ZESTORETIC  Take 1 tablet by mouth daily.     LORazepam 0.5 MG tablet  Commonly known as:  ATIVAN  Take 1 tablet (0.5 mg total) by mouth every 6 (six) hours as needed (Nausea  or vomiting).     metFORMIN 500 MG 24 hr tablet  Commonly known as:  GLUCOPHAGE-XR  Take 500-1,000 mg by mouth 2 (two) times daily. Take 500 mg every morning and 1000 mg every evening     metFORMIN 500 MG 24 hr tablet  Commonly known as:  GLUCOPHAGE-XR  TAKE 1 TABLET EVERY MORNING AND 2 TABLETS IN THE EVENING     metoprolol 100 MG tablet  Commonly known as:  LOPRESSOR  TAKE 1 TABLET TWICE A DAY     ondansetron 8 MG tablet  Commonly known as:  ZOFRAN  Take 1 tablet (8 mg total) by mouth 2 (two) times daily as needed. Start on the third day after chemotherapy.     oxyCODONE-acetaminophen 5-325 MG per tablet  Commonly known as:  ROXICET  Take 1-2 tablets by mouth every 4 (four) hours as needed.     pioglitazone 30 MG tablet  Commonly known as:  ACTOS  TAKE 1 TABLET (30 MG TOTAL) BY MOUTH DAILY.     potassium chloride SA 20 MEQ tablet  Commonly known as:  K-DUR,KLOR-CON  Take 20 mEq by mouth daily.     prochlorperazine 10 MG tablet  Commonly known as:  COMPAZINE  Take 1 tablet (10 mg total) by mouth every 6 (six) hours as needed (Nausea or vomiting).     rivaroxaban 20 MG Tabs  tablet  Commonly known as:  XARELTO  Take 20 mg by mouth daily with supper.     SYNTHROID 50 MCG tablet  Generic drug:  levothyroxine  TAKE 1 TABLET (50 MCG TOTAL) BY MOUTH DAILY BEFORE BREAKFAST.     SYSTANE OP  Place 1 drop into both eyes 3 (three) times daily.     vitamin B-12 1000 MCG tablet  Commonly known as:  CYANOCOBALAMIN  Take 1,000 mcg by mouth daily.     Vitamin D 2000 UNITS Caps  Take 2,000 Units by mouth daily.        Allergies: No Known Allergies  Past Medical History  Diagnosis Date  . Hypertension   . Hyperlipidemia   . Diabetes mellitus without complication   . Thyroid disease     hypothyroidism  . Anemia   . Former smoker   . Multinodular goiter   . Complication of anesthesia     slow to wake up  . Cancer     right breast  . Wears glasses   . Full dentures   . Dysrhythmia 10/15    AF  . Breast cancer 10/2013    right upper outer    Past Surgical History  Procedure Laterality Date  . Abdominal hysterectomy    . Portacath placement N/A 11/15/2013    Procedure: INSERTION PORT-A-CATH WITH ULTRA SOUND AND FLOROSCOPY;  Surgeon: [name], MD;  Location: [facility];  Service: General;  Laterality: N/A;  . Eye surgery  12/22/2009    cataracts  . Total knee arthroplasty  2002    left  . Port-a-cath removal Right 01/09/2014    Procedure: REMOVAL PORT-A-CATH;  Surgeon: [name], MD;  Location: [facility];  Service: General;  Laterality: Right;  . Breast lumpectomy with radioactive seed localization Right 01/09/2014    Procedure: RIGHT BREAST SEED LOCALIZED LUMPECTOMY ;  Surgeon: [name], MD;  Location: [facility];  Service: General;  Laterality: Right;  . Axillary lymph node dissection Right 01/09/2014    Procedure: RIGHT AXILLARY LYMPH NODE DISECTION;  Surgeon: [name], MD;  Location:  [facility];  Service: General;  Laterality: Right;    No family history on file.  Social History:   reports that she quit smoking about 55 years ago. She has never used smokeless tobacco. She reports that she does not drink alcohol or use illicit drugs.  Review of Systems:  Hypertension: She previously has had good control with her lisinopril HCTZ and is also taking amlodipine   Also the microalbumin/creatinine has been historically normal She was taking Lasix for previous history of edema but has not taken this recently She still has some swelling of her legs although better than before mom mostly on the right side   ADRENAL mass:  She was evaluated by a PET scan after recent diagnosis of breast cancer She was found to have a 3.7 cm hypermetabolic left adrenal mass  On a previous CT scan in 2011 she also had a 3.8 cm adrenal mass She did not come back for evaluation with plasma metanephrines and dexamethasone suppression test  No problems with hypokalemia recently and is not taking any supplements  Renal function: Creatinine is improved  Lab Results  Component Value Date   CREATININE 1.17* 01/30/2014    Lipids: Has been on long-term Lipitor with good control    Lab Results  Component Value Date   CHOL 156 04/07/2013   HDL 78.80 04/07/2013   LDLCALC 66 04/07/2013   TRIG 54.0 04/07/2013   CHOLHDL 2 04/07/2013    She has had vitamin D deficiency, her level was 35 previously with 2000 units vitamin D 3  HYPOTHYROIDISM: She has had a multinodular goiter and not clear if she is truly hypothyroid. Over the last couple of years her dose has been tapered down because of low normal TSH levels and has not had any increase in the size of her goiter.  She is compliant with her thyroid medication in the morning before breakfast. No fatigue  TSH have been low normal and is now taking only 25 g, stable levels  Lab Results  Component Value Date   FREET4 1.42 11/08/2013   FREET4 1.37 11/24/2012   TSH 0.549 01/30/2014   TSH 0.551 12/08/2013   TSH 0.322 11/08/2013     Examination:   There were no vitals taken for this visit.  There is no weight on file to calculate BMI.   Repeat blood pressure 155/90 She has 2+ right lower leg and 1+ left lower leg edema  ASSESSMENT/ PLAN:  Diabetes type 2   Blood glucose control is appearing worse with stopping Actos Not responding to [place] which she has been taking for the last 2 weeks or so She has not brought her monitor for download.   Discussed that we can try her on glipizide ER 5 mg daily for simplicity and lower cost However will need to have follow-up in 3-4 weeks She does need to bring her monitor for review Advised her to be consistent with diet and increase activity as tolerated Her weight is stable and A1c will be checked  Hypertension: Blood pressure appears higher than usual even with taking a 3 drug regimen She will double her lisinopril HCT and have follow-up in 3 weeks Also will evaluate her plasma metanephrines today  ADRENAL MASS  This has been stable but will need to evaluate function with plasma metanephrines which she has not had done as instructed previously  EDEMA: Slightly better with stopping her Actos but still has some edema especially on the right side, may be  related to venous insufficiency.  Since lisinopril HCT is being increased will not start Lasix unless edema persists  Hypokalemia: Previously from diuretics, will recheck her levels today  Multinodular goiter: On low doses of thyroxine suppression with low normal TSH Consider stopping levothyroxine  Medication management: The patient is somewhat unclear about what medications he is taking and will review her medications by calling her at home  Total visit time including clinical management, review of medications, records and counseling = 25 minutes  Aries Townley 02/16/2014, 9:33 AM

## 2014-02-17 ENCOUNTER — Telehealth: Payer: Self-pay | Admitting: Hematology and Oncology

## 2014-02-17 NOTE — Telephone Encounter (Signed)
no vm...mailed pt appt sched and letter °

## 2014-02-18 LAB — COMPREHENSIVE METABOLIC PANEL
ALT: 12 U/L (ref 0–35)
AST: 18 U/L (ref 0–37)
Albumin: 3.8 g/dL (ref 3.5–5.2)
Alkaline Phosphatase: 46 U/L (ref 39–117)
BUN: 13 mg/dL (ref 6–23)
CO2: 27 mEq/L (ref 19–32)
Calcium: 9.8 mg/dL (ref 8.4–10.5)
Chloride: 103 mEq/L (ref 96–112)
Creatinine, Ser: 0.8 mg/dL (ref 0.4–1.2)
GFR: 87.88 mL/min (ref >60.00)
Glucose, Bld: 118 mg/dL — ABNORMAL HIGH (ref 70–99)
Potassium: 3.6 mEq/L (ref 3.5–5.1)
Sodium: 140 mEq/L (ref 135–145)
Total Bilirubin: 0.8 mg/dL (ref 0.2–1.2)
Total Protein: 6.3 g/dL (ref 6.0–8.3)

## 2014-02-19 ENCOUNTER — Other Ambulatory Visit (INDEPENDENT_AMBULATORY_CARE_PROVIDER_SITE_OTHER): Payer: Medicare Other

## 2014-02-19 ENCOUNTER — Telehealth: Payer: Self-pay | Admitting: Endocrinology

## 2014-02-19 DIAGNOSIS — E119 Type 2 diabetes mellitus without complications: Secondary | ICD-10-CM

## 2014-02-19 LAB — HEMOGLOBIN A1C: Hgb A1c MFr Bld: 5.8 % (ref 4.6–6.5)

## 2014-02-19 NOTE — Telephone Encounter (Signed)
Patient has question about her medication, please advise °

## 2014-02-22 LAB — METANEPHRINES, PLASMA
Metanephrine, Free: 56 pg/mL (ref 0–62)
Normetanephrine, Free: 89 pg/mL (ref 0–145)

## 2014-02-23 ENCOUNTER — Inpatient Hospital Stay (HOSPITAL_COMMUNITY)
Admission: EM | Admit: 2014-02-23 | Discharge: 2014-02-27 | DRG: 481 | Disposition: A | Discharge status: Skilled Nursing Facility | Payer: Medicare Other | Attending: Family Medicine | Admitting: Family Medicine

## 2014-02-23 ENCOUNTER — Encounter (HOSPITAL_COMMUNITY): Payer: Self-pay | Admitting: *Deleted

## 2014-02-23 DIAGNOSIS — E785 Hyperlipidemia, unspecified: Secondary | ICD-10-CM | POA: Diagnosis present

## 2014-02-23 DIAGNOSIS — Z79899 Other long term (current) drug therapy: Secondary | ICD-10-CM

## 2014-02-23 DIAGNOSIS — I5032 Chronic diastolic (congestive) heart failure: Secondary | ICD-10-CM | POA: Diagnosis present

## 2014-02-23 DIAGNOSIS — C50411 Malignant neoplasm of upper-outer quadrant of right female breast: Secondary | ICD-10-CM | POA: Diagnosis present

## 2014-02-23 DIAGNOSIS — Z9221 Personal history of antineoplastic chemotherapy: Secondary | ICD-10-CM

## 2014-02-23 DIAGNOSIS — I10 Essential (primary) hypertension: Secondary | ICD-10-CM | POA: Diagnosis present

## 2014-02-23 DIAGNOSIS — S7223XA Displaced subtrochanteric fracture of unspecified femur, initial encounter for closed fracture: Principal | ICD-10-CM | POA: Diagnosis present

## 2014-02-23 DIAGNOSIS — Z7901 Long term (current) use of anticoagulants: Secondary | ICD-10-CM

## 2014-02-23 DIAGNOSIS — S72002A Fracture of unspecified part of neck of left femur, initial encounter for closed fracture: Secondary | ICD-10-CM | POA: Diagnosis present

## 2014-02-23 DIAGNOSIS — W1839XA Other fall on same level, initial encounter: Secondary | ICD-10-CM | POA: Diagnosis present

## 2014-02-23 DIAGNOSIS — D62 Acute posthemorrhagic anemia: Secondary | ICD-10-CM | POA: Diagnosis not present

## 2014-02-23 DIAGNOSIS — I483 Typical atrial flutter: Secondary | ICD-10-CM

## 2014-02-23 DIAGNOSIS — I480 Paroxysmal atrial fibrillation: Secondary | ICD-10-CM | POA: Diagnosis present

## 2014-02-23 DIAGNOSIS — Z87891 Personal history of nicotine dependence: Secondary | ICD-10-CM

## 2014-02-23 DIAGNOSIS — Z419 Encounter for procedure for purposes other than remedying health state, unspecified: Secondary | ICD-10-CM

## 2014-02-23 DIAGNOSIS — E039 Hypothyroidism, unspecified: Secondary | ICD-10-CM | POA: Diagnosis present

## 2014-02-23 DIAGNOSIS — M25552 Pain in left hip: Secondary | ICD-10-CM | POA: Diagnosis not present

## 2014-02-23 DIAGNOSIS — M810 Age-related osteoporosis without current pathological fracture: Secondary | ICD-10-CM | POA: Diagnosis present

## 2014-02-23 DIAGNOSIS — E119 Type 2 diabetes mellitus without complications: Secondary | ICD-10-CM

## 2014-02-23 DIAGNOSIS — Z96652 Presence of left artificial knee joint: Secondary | ICD-10-CM | POA: Diagnosis present

## 2014-02-23 DIAGNOSIS — C50419 Malignant neoplasm of upper-outer quadrant of unspecified female breast: Secondary | ICD-10-CM | POA: Diagnosis present

## 2014-02-23 DIAGNOSIS — E876 Hypokalemia: Secondary | ICD-10-CM | POA: Diagnosis not present

## 2014-02-23 HISTORY — DX: Unspecified atrial fibrillation: I48.91

## 2014-02-23 MED ORDER — FENTANYL CITRATE 0.05 MG/ML IJ SOLN
50.0000 ug | INTRAMUSCULAR | Status: AC | PRN
  Administered 2014-02-24 (×2): 50 ug via INTRAVENOUS
  Filled 2014-02-23 (×2): qty 2

## 2014-02-23 MED ORDER — ONDANSETRON HCL 4 MG/2ML IJ SOLN
4.0000 mg | Freq: Once | INTRAMUSCULAR | Status: AC
  Administered 2014-02-24: 4 mg via INTRAVENOUS
  Filled 2014-02-23: qty 2

## 2014-02-23 MED ORDER — HYDROCODONE-ACETAMINOPHEN 5-325 MG PO TABS
1.0000 | ORAL_TABLET | ORAL | Status: DC | PRN
  Administered 2014-02-24 – 2014-02-25 (×4): 1 via ORAL
  Administered 2014-02-26 (×2): 2 via ORAL
  Filled 2014-02-23 (×3): qty 1
  Filled 2014-02-23 (×2): qty 2
  Filled 2014-02-23: qty 1

## 2014-02-23 NOTE — ED Provider Notes (Signed)
CSN: [id number]     Arrival date & time 02/23/14  2317 History   First MD Initiated Contact with Patient 02/23/14 2328     This chart was scribed for Kalman Drape, MD by Forrestine Him, ED Scribe. This patient was seen in room D33C/D33C and the patient's care was started 11:34 PM.   Chief Complaint  Patient presents with  . Fall  . Hip Pain   The history is provided by the patient. No language interpreter was used.    HPI Comments: Whitney [name] brought in by EMS is a 80 y.o. female with a PMHx of HTN, hyperlipidemia, DM, thyroid disease, and A-Fib who presents to the Emergency Department complaining of a fall that occurred just prior to arrival. Pt states she went to turn her stove off and lost her balance resulting in her falling. She denies any LOC or head trauma. She now c/o constant, moderate L hip pain that is unchanged. Pain is exacerbated with movement and alleviated at rest and when both legs are bounded together. Pt was given 150 mcg of Fentyl while en route to ED. Pt denies any knee pain. Pt is currently on Lasix and Xarelto daily. No history of fluid back up in lungs. She denies any known allergies to medications.   She is followed by Dr. Jenna Luo PCP She is also followed by [facility] E[facility]  Past Medical History  Diagnosis Date  . Hypertension   . Hyperlipidemia   . Diabetes mellitus without complication   . Thyroid disease     hypothyroidism  . Anemia   . Former smoker   . Multinodular goiter   . Complication of anesthesia     slow to wake up  . Cancer     right breast  . Wears glasses   . Full dentures   . Dysrhythmia 10/15    AF  . Breast cancer 10/2013    right upper outer  . Atrial fibrillation    Past Surgical History  Procedure Laterality Date  . Abdominal hysterectomy    . Portacath placement N/A 11/15/2013    Procedure: INSERTION PORT-A-CATH WITH ULTRA SOUND AND FLOROSCOPY;  Surgeon: [name], MD;  Location: [facility];   Service: General;  Laterality: N/A;  . Eye surgery  12/22/2009    cataracts  . Total knee arthroplasty  2002    left  . Port-a-cath removal Right 01/09/2014    Procedure: REMOVAL PORT-A-CATH;  Surgeon: [name], MD;  Location: [facility];  Service: General;  Laterality: Right;  . Breast lumpectomy with radioactive seed localization Right 01/09/2014    Procedure: RIGHT BREAST SEED LOCALIZED LUMPECTOMY ;  Surgeon: [name], MD;  Location: [facility];  Service: General;  Laterality: Right;  . Axillary lymph node dissection Right 01/09/2014    Procedure: RIGHT AXILLARY LYMPH NODE DISECTION;  Surgeon: [name], MD;  Location: [facility];  Service: General;  Laterality: Right;   No family history on file. History  Substance Use Topics  . Smoking status: Former Smoker -- 1.00 packs/day for 5 years    Quit date: 11/14/1958  . Smokeless tobacco: Never Used  . Alcohol Use: No   OB History    No data available     Review of Systems  Constitutional: Negative for fever and chills.  Cardiovascular: Positive for leg swelling.  Musculoskeletal: Positive for arthralgias.  All other systems reviewed and are negative.  Allergies  Review of patient's allergies indicates no known allergies.  Home Medications   Prior to Admission medications   Medication Sig Start Date End Date Taking? Authorizing Provider  amiodarone (PACERONE) 200 MG tablet Take 1 tablet (200 mg total) by mouth daily. 01/19/14   [name], MD  amLODipine (NORVASC) 5 MG tablet Take 5 mg by mouth daily. 12/12/13   Historical Provider, MD  atorvastatin (LIPITOR) 10 MG tablet Take 10 mg by mouth daily.    Historical Provider, MD  Cholecalciferol (VITAMIN D) 2000 UNITS CAPS Take 2,000 Units by mouth daily.    Historical Provider, MD  dexamethasone (DECADRON) 4 MG tablet  12/01/13   Historical Provider, MD  ferrous sulfate 325 (65 FE) MG EC tablet Take 325 mg by  mouth daily with breakfast.    Historical Provider, MD  furosemide (LASIX) 40 MG tablet Take 40 mg by mouth daily.  09/20/13   Elayne Snare, MD  linagliptin (TRADJENTA) 5 MG TABS tablet Take 5 mg by mouth daily.    Historical Provider, MD  lisinopril-hydrochlorothiazide (PRINZIDE,ZESTORETIC) 20-12.5 MG per tablet Take 1 tablet by mouth daily. 12/12/13   Historical Provider, MD  LORazepam (ATIVAN) 0.5 MG tablet Take 1 tablet (0.5 mg total) by mouth every 6 (six) hours as needed (Nausea or vomiting). Patient not taking: Reported on 02/14/2014 11/14/13   [name], MD  metFORMIN (GLUCOPHAGE-XR) 500 MG 24 hr tablet Take 500-1,000 mg by mouth 2 (two) times daily. Take 500 mg every morning and 1000 mg every evening    Historical Provider, MD  metFORMIN (GLUCOPHAGE-XR) 500 MG 24 hr tablet TAKE 1 TABLET EVERY MORNING AND 2 TABLETS IN THE EVENING Patient not taking: Reported on 02/14/2014 01/31/14   Elayne Snare, MD  metoprolol (LOPRESSOR) 100 MG tablet TAKE 1 TABLET TWICE A DAY 02/12/14   Elayne Snare, MD  ondansetron (ZOFRAN) 8 MG tablet Take 1 tablet (8 mg total) by mouth 2 (two) times daily as needed. Start on the third day after chemotherapy. Patient not taking: Reported on 02/14/2014 11/14/13   [name], MD  oxyCODONE-acetaminophen (ROXICET) 5-325 MG per tablet Take 1-2 tablets by mouth every 4 (four) hours as needed. Patient not taking: Reported on 02/14/2014 01/09/14   [name], MD  pioglitazone (ACTOS) 30 MG tablet TAKE 1 TABLET (30 MG TOTAL) BY MOUTH DAILY. 02/10/14   Elayne Snare, MD  Polyethyl Glycol-Propyl Glycol (SYSTANE OP) Place 1 drop into both eyes 3 (three) times daily.    Historical Provider, MD  potassium chloride SA (K-DUR,KLOR-CON) 20 MEQ tablet Take 20 mEq by mouth daily.    Historical Provider, MD  prochlorperazine (COMPAZINE) 10 MG tablet Take 1 tablet (10 mg total) by mouth every 6 (six) hours as needed (Nausea or vomiting). Patient not taking: Reported on 02/14/2014 11/14/13   [name], MD  rivaroxaban (XARELTO) 20 MG TABS tablet Take 20 mg by mouth daily with supper.    Historical Provider, MD  SYNTHROID 50 MCG tablet TAKE 1 TABLET (50 MCG TOTAL) BY MOUTH DAILY BEFORE BREAKFAST. 01/12/14   Elayne Snare, MD  vitamin B-12 (CYANOCOBALAMIN) 1000 MCG tablet Take 1,000 mcg by mouth daily.    Historical Provider, MD   Triage Vitals: BP 165/72 mmHg  Pulse 69  Temp(Src) 97.9 F (36.6 C) (Oral)  Resp 15  Ht 5\' 7"  (1.702 m)  Wt 192 lb (87.091 kg)  BMI 30.06 kg/m2  SpO2 94%   Physical Exam  Constitutional: She is oriented to  person, place, and time. She appears well-developed and well-nourished. No distress.  HENT:  Head: Normocephalic and atraumatic.  Right Ear: External ear normal.  Left Ear: External ear normal.  Nose: Nose normal.  Mouth/Throat: Oropharynx is clear and moist.  Eyes: Conjunctivae and EOM are normal. Pupils are equal, round, and reactive to light.  Neck: Normal range of motion. Neck supple. No JVD present. No tracheal deviation present. No thyromegaly present.  Cardiovascular: Regular rhythm, normal heart sounds and intact distal pulses.  Exam reveals no gallop and no friction rub.   No murmur heard. Irregular rate  Pulmonary/Chest: Effort normal and breath sounds normal. No stridor. No respiratory distress. She has no wheezes. She has no rales. She exhibits no tenderness.  Abdominal: Soft. Bowel sounds are normal. She exhibits no distension and no mass. There is no tenderness. There is no rebound and no guarding.  Musculoskeletal: She exhibits edema and tenderness.  Pt with shortening and inward rotation of left leg, pain with ROM and with palpation of left thigh.  Pt distally NVI  Lymphadenopathy:    She has no cervical adenopathy.  Neurological: She is alert and oriented to person, place, and time. She displays normal reflexes. She exhibits normal muscle tone. Coordination normal.  Skin: Skin is warm and dry. No rash noted. No erythema. No pallor.   Psychiatric: She has a normal mood and affect. Her behavior is normal. Judgment and thought content normal.  Nursing note and vitals reviewed.   ED Course  Procedures (including critical care time)  DIAGNOSTIC STUDIES: Oxygen Saturation is 94% on RA, adequate by my interpretation.    COORDINATION OF CARE: 11:30 PM-Will give zofran, sublimaze, and norco. Will order DG chest 1 view, DG hip complete L, urinalysis, BMP, CBC, and protime INR. Discussed treatment plan with pt at bedside and pt agreed to plan.     Labs Review Labs Reviewed  BASIC METABOLIC PANEL - Abnormal; Notable for the following:    Potassium 3.0 (*)    Glucose, Bld 176 (*)    BUN 28 (*)    Creatinine, Ser 1.12 (*)    GFR calc non Af Amer 44 (*)    GFR calc Af Amer 51 (*)    Anion gap 17 (*)    All other components within normal limits  CBC WITH DIFFERENTIAL - Abnormal; Notable for the following:    RBC 3.49 (*)    Hemoglobin 10.0 (*)    HCT 31.4 (*)    RDW 16.1 (*)    Neutrophils Relative % 86 (*)    Lymphocytes Relative 8 (*)    Lymphs Abs 0.6 (*)    All other components within normal limits  PROTIME-INR - Abnormal; Notable for the following:    Prothrombin Time 25.3 (*)    INR 2.28 (*)    All other components within normal limits  URINALYSIS, ROUTINE W REFLEX MICROSCOPIC - Abnormal; Notable for the following:    Ketones, ur 15 (*)    All other components within normal limits  GLUCOSE, CAPILLARY - Abnormal; Notable for the following:    Glucose-Capillary 157 (*)    All other components within normal limits  TYPE AND SCREEN    Imaging Review Dg Chest 1 View  02/24/2014   CLINICAL DATA:  Preop chest for suspected left hip fracture from fall.  EXAM: CHEST - 1 VIEW  COMPARISON:  12/08/2013  FINDINGS: Borderline heart size with normal pulmonary vascularity. No focal airspace disease or consolidation in the lungs.  No blunting of costophrenic angles. No pneumothorax. Calcified and tortuous aorta. Surgical  clips in the left axilla. Interval removal of previous central venous catheter.  IMPRESSION: Borderline heart size.  No evidence of active pulmonary disease.   Electronically Signed   By: Lucienne Capers M.D.   On: 02/24/2014 02:06   Dg Hip Complete Left  02/24/2014   CLINICAL DATA:  [place] from standing.  Left-sided hip pain.  EXAM: LEFT HIP - COMPLETE 2+ VIEW  COMPARISON:  None.  FINDINGS: Comminuted inter trochanteric and subtrochanteric fractures of the left proximal femur with varus angulation of the fracture fragments. Displaced lesser trochanteric fragment. No dislocation at the hip joint. Diffuse bone demineralization. Irregularity of the left superior and inferior pubic rami are nonspecific and may represent old or acute fractures.  IMPRESSION: Acute comminuted fracture of the inter trochanteric and subtrochanteric left hip with varus angulation. Age indeterminate fractures of the left pubic rami.   Electronically Signed   By: Lucienne Capers M.D.   On: 02/24/2014 01:26     EKG Interpretation   Date/Time:  Saturday February 24 2014 02:09:05 EST Ventricular Rate:  66 PR Interval:    QRS Duration: 94 QT Interval:  374 QTC Calculation: 392 R Axis:   49 Text Interpretation:  Atrial flutter Nonspecific T wave abnormality  Abnormal ECG Confirmed by Magin Balbi  MD, Aerial Dilley ([id number]) on 02/24/2014 3:18:43 AM      MDM   Final diagnoses:  Closed left hip fracture, initial encounter  Anticoagulated  Typical atrial flutter    80 yo female s/p fall, left hip fracture, anticoagulated.  D/w Dr Alcario Drought who will admit from medicine service.  Dr Berenice Primas from orthopedics will see as well.    I personally performed the services described in this documentation, which was scribed in my presence. The recorded information has been reviewed and is accurate.    Kalman Drape, MD 02/24/14 [phone]

## 2014-02-23 NOTE — ED Notes (Signed)
Pt arrives from home via [facility]. Pt c/o left hip pain. Pt fell from standing position while trying to turn off her space heater. EMS gave 127mcg of Fentyl. Pt rates pain a 4/10. EMS binded hip and pt stated it feels a lot better.

## 2014-02-24 ENCOUNTER — Emergency Department (HOSPITAL_COMMUNITY): Payer: Medicare Other

## 2014-02-24 ENCOUNTER — Inpatient Hospital Stay (HOSPITAL_COMMUNITY): Payer: Medicare Other | Admitting: Anesthesiology

## 2014-02-24 ENCOUNTER — Encounter (HOSPITAL_COMMUNITY)
Admission: EM | Disposition: A | Discharge status: Skilled Nursing Facility | Payer: Self-pay | Source: Home / Self Care | Attending: Family Medicine

## 2014-02-24 ENCOUNTER — Inpatient Hospital Stay (HOSPITAL_COMMUNITY): Payer: Medicare Other

## 2014-02-24 DIAGNOSIS — C50419 Malignant neoplasm of upper-outer quadrant of unspecified female breast: Secondary | ICD-10-CM | POA: Diagnosis present

## 2014-02-24 DIAGNOSIS — I10 Essential (primary) hypertension: Secondary | ICD-10-CM

## 2014-02-24 DIAGNOSIS — Z79899 Other long term (current) drug therapy: Secondary | ICD-10-CM | POA: Diagnosis not present

## 2014-02-24 DIAGNOSIS — Z96652 Presence of left artificial knee joint: Secondary | ICD-10-CM | POA: Diagnosis present

## 2014-02-24 DIAGNOSIS — D62 Acute posthemorrhagic anemia: Secondary | ICD-10-CM | POA: Diagnosis not present

## 2014-02-24 DIAGNOSIS — Z87891 Personal history of nicotine dependence: Secondary | ICD-10-CM | POA: Diagnosis not present

## 2014-02-24 DIAGNOSIS — I5032 Chronic diastolic (congestive) heart failure: Secondary | ICD-10-CM

## 2014-02-24 DIAGNOSIS — Z7901 Long term (current) use of anticoagulants: Secondary | ICD-10-CM | POA: Diagnosis not present

## 2014-02-24 DIAGNOSIS — I480 Paroxysmal atrial fibrillation: Secondary | ICD-10-CM

## 2014-02-24 DIAGNOSIS — E876 Hypokalemia: Secondary | ICD-10-CM | POA: Diagnosis not present

## 2014-02-24 DIAGNOSIS — E785 Hyperlipidemia, unspecified: Secondary | ICD-10-CM | POA: Diagnosis present

## 2014-02-24 DIAGNOSIS — S7223XA Displaced subtrochanteric fracture of unspecified femur, initial encounter for closed fracture: Secondary | ICD-10-CM | POA: Diagnosis present

## 2014-02-24 DIAGNOSIS — E119 Type 2 diabetes mellitus without complications: Secondary | ICD-10-CM

## 2014-02-24 DIAGNOSIS — E039 Hypothyroidism, unspecified: Secondary | ICD-10-CM | POA: Diagnosis present

## 2014-02-24 DIAGNOSIS — W1839XA Other fall on same level, initial encounter: Secondary | ICD-10-CM | POA: Diagnosis present

## 2014-02-24 DIAGNOSIS — S72002A Fracture of unspecified part of neck of left femur, initial encounter for closed fracture: Secondary | ICD-10-CM | POA: Diagnosis present

## 2014-02-24 DIAGNOSIS — M810 Age-related osteoporosis without current pathological fracture: Secondary | ICD-10-CM | POA: Diagnosis present

## 2014-02-24 DIAGNOSIS — C50411 Malignant neoplasm of upper-outer quadrant of right female breast: Secondary | ICD-10-CM

## 2014-02-24 DIAGNOSIS — M25552 Pain in left hip: Secondary | ICD-10-CM | POA: Diagnosis present

## 2014-02-24 DIAGNOSIS — Z9221 Personal history of antineoplastic chemotherapy: Secondary | ICD-10-CM | POA: Diagnosis not present

## 2014-02-24 HISTORY — PX: INTRAMEDULLARY (IM) NAIL INTERTROCHANTERIC: SHX5875

## 2014-02-24 LAB — CBC WITH DIFFERENTIAL/PLATELET
Basophils Absolute: 0 10*3/uL (ref 0.0–0.1)
Basophils Relative: 0 % (ref 0–1)
Eosinophils Absolute: 0 10*3/uL (ref 0.0–0.7)
Eosinophils Relative: 0 % (ref 0–5)
HCT: 31.4 % — ABNORMAL LOW (ref 36.0–46.0)
Hemoglobin: 10 g/dL — ABNORMAL LOW (ref 12.0–15.0)
Lymphocytes Relative: 8 % — ABNORMAL LOW (ref 12–46)
Lymphs Abs: 0.6 10*3/uL — ABNORMAL LOW (ref 0.7–4.0)
MCH: 28.7 pg (ref 26.0–34.0)
MCHC: 31.8 g/dL (ref 30.0–36.0)
MCV: 90 fL (ref 78.0–100.0)
Monocytes Absolute: 0.4 10*3/uL (ref 0.1–1.0)
Monocytes Relative: 6 % (ref 3–12)
Neutro Abs: 5.9 10*3/uL (ref 1.7–7.7)
Neutrophils Relative %: 86 % — ABNORMAL HIGH (ref 43–77)
Platelets: 254 10*3/uL (ref 150–400)
RBC: 3.49 MIL/uL — ABNORMAL LOW (ref 3.87–5.11)
RDW: 16.1 % — ABNORMAL HIGH (ref 11.5–15.5)
WBC: 6.9 10*3/uL (ref 4.0–10.5)

## 2014-02-24 LAB — GLUCOSE, CAPILLARY
Glucose-Capillary: 157 mg/dL — ABNORMAL HIGH (ref 70–99)
Glucose-Capillary: 165 mg/dL — ABNORMAL HIGH (ref 70–99)
Glucose-Capillary: 136 mg/dL — ABNORMAL HIGH (ref 70–99)
Glucose-Capillary: 129 mg/dL — ABNORMAL HIGH (ref 70–99)
Glucose-Capillary: 198 mg/dL — ABNORMAL HIGH (ref 70–99)
Glucose-Capillary: 193 mg/dL — ABNORMAL HIGH (ref 70–99)

## 2014-02-24 LAB — URINALYSIS, ROUTINE W REFLEX MICROSCOPIC
Bilirubin Urine: NEGATIVE
Glucose, UA: NEGATIVE mg/dL
Hgb urine dipstick: NEGATIVE
Ketones, ur: 15 mg/dL — AB
Leukocytes, UA: NEGATIVE
Nitrite: NEGATIVE
Protein, ur: NEGATIVE mg/dL
Specific Gravity, Urine: 1.015 (ref 1.005–1.030)
Urobilinogen, UA: 0.2 mg/dL (ref 0.0–1.0)
pH: 6 (ref 5.0–8.0)

## 2014-02-24 LAB — BASIC METABOLIC PANEL
Anion gap: 17 — ABNORMAL HIGH (ref 5–15)
Anion gap: 13 (ref 5–15)
BUN: 28 mg/dL — ABNORMAL HIGH (ref 6–23)
BUN: 22 mg/dL (ref 6–23)
CO2: 27 mEq/L (ref 19–32)
CO2: 30 mEq/L (ref 19–32)
Calcium: 10.1 mg/dL (ref 8.4–10.5)
Calcium: 9.4 mg/dL (ref 8.4–10.5)
Chloride: 98 mEq/L (ref 96–112)
Chloride: 101 mEq/L (ref 96–112)
Creatinine, Ser: 1.12 mg/dL — ABNORMAL HIGH (ref 0.50–1.10)
Creatinine, Ser: 0.86 mg/dL (ref 0.50–1.10)
GFR calc Af Amer: 51 mL/min — ABNORMAL LOW (ref >90)
GFR calc Af Amer: 70 mL/min — ABNORMAL LOW (ref >90)
GFR calc non Af Amer: 44 mL/min — ABNORMAL LOW (ref >90)
GFR calc non Af Amer: 60 mL/min — ABNORMAL LOW (ref >90)
Glucose, Bld: 176 mg/dL — ABNORMAL HIGH (ref 70–99)
Glucose, Bld: 154 mg/dL — ABNORMAL HIGH (ref 70–99)
Potassium: 3 mEq/L — ABNORMAL LOW (ref 3.7–5.3)
Potassium: 3 mEq/L — ABNORMAL LOW (ref 3.7–5.3)
Sodium: 142 mEq/L (ref 137–147)
Sodium: 144 mEq/L (ref 137–147)

## 2014-02-24 LAB — PROTIME-INR
INR: 2.28 — ABNORMAL HIGH (ref 0.00–1.49)
INR: 1.84 — ABNORMAL HIGH (ref 0.00–1.49)
Prothrombin Time: 25.3 seconds — ABNORMAL HIGH (ref 11.6–15.2)
Prothrombin Time: 21.4 seconds — ABNORMAL HIGH (ref 11.6–15.2)

## 2014-02-24 SURGERY — FIXATION, FRACTURE, INTERTROCHANTERIC, WITH INTRAMEDULLARY ROD
Anesthesia: General | Site: Leg Lower | Laterality: Left

## 2014-02-24 MED ORDER — POLYETHYLENE GLYCOL 3350 17 G PO PACK: 17.0000 g | PACK | Freq: Every day | ORAL | Status: DC | PRN

## 2014-02-24 MED ORDER — METHOCARBAMOL 1000 MG/10ML IJ SOLN
500.0000 mg | Freq: Four times a day (QID) | INTRAVENOUS | Status: DC | PRN
  Filled 2014-02-24: qty 5

## 2014-02-24 MED ORDER — FLEET ENEMA 7-19 GM/118ML RE ENEM: 1.0000 | ENEMA | Freq: Once | RECTAL | Status: AC | PRN

## 2014-02-24 MED ORDER — ALBUMIN HUMAN 5 % IV SOLN
INTRAVENOUS | Status: DC | PRN
  Administered 2014-02-24: 13:00:00 via INTRAVENOUS

## 2014-02-24 MED ORDER — LIDOCAINE HCL (CARDIAC) 20 MG/ML IV SOLN
INTRAVENOUS | Status: AC
  Filled 2014-02-24: qty 5

## 2014-02-24 MED ORDER — MORPHINE SULFATE 2 MG/ML IJ SOLN
0.5000 mg | INTRAMUSCULAR | Status: DC | PRN
  Administered 2014-02-24: 0.5 mg via INTRAVENOUS
  Filled 2014-02-24: qty 1

## 2014-02-24 MED ORDER — ONDANSETRON HCL 4 MG PO TABS: 4.0000 mg | ORAL_TABLET | Freq: Four times a day (QID) | ORAL | Status: DC | PRN

## 2014-02-24 MED ORDER — ROCURONIUM BROMIDE 50 MG/5ML IV SOLN
INTRAVENOUS | Status: AC
Start: 2014-02-24 — End: 2014-02-24
  Filled 2014-02-24: qty 1

## 2014-02-24 MED ORDER — ONDANSETRON HCL 4 MG/2ML IJ SOLN: 4.0000 mg | Freq: Four times a day (QID) | INTRAMUSCULAR | Status: DC | PRN

## 2014-02-24 MED ORDER — SODIUM CHLORIDE 0.9 % IV SOLN
INTRAVENOUS | Status: DC
  Administered 2014-02-24 – 2014-02-26 (×4): via INTRAVENOUS

## 2014-02-24 MED ORDER — SUCCINYLCHOLINE CHLORIDE 20 MG/ML IJ SOLN
INTRAMUSCULAR | Status: AC
  Filled 2014-02-24: qty 1

## 2014-02-24 MED ORDER — PROCHLORPERAZINE MALEATE 10 MG PO TABS
10.0000 mg | ORAL_TABLET | Freq: Four times a day (QID) | ORAL | Status: DC | PRN
  Filled 2014-02-24: qty 1

## 2014-02-24 MED ORDER — NEOSTIGMINE METHYLSULFATE 10 MG/10ML IV SOLN
INTRAVENOUS | Status: DC | PRN
  Administered 2014-02-24: 3 mg via INTRAVENOUS

## 2014-02-24 MED ORDER — HYDROCHLOROTHIAZIDE 12.5 MG PO CAPS
12.5000 mg | ORAL_CAPSULE | Freq: Every day | ORAL | Status: DC
  Administered 2014-02-24 – 2014-02-27 (×4): 12.5 mg via ORAL
  Filled 2014-02-24 (×4): qty 1

## 2014-02-24 MED ORDER — FENTANYL CITRATE 0.05 MG/ML IJ SOLN
INTRAMUSCULAR | Status: AC
  Filled 2014-02-24: qty 5

## 2014-02-24 MED ORDER — METOPROLOL TARTRATE 100 MG PO TABS
100.0000 mg | ORAL_TABLET | Freq: Two times a day (BID) | ORAL | Status: DC
  Administered 2014-02-24 – 2014-02-27 (×6): 100 mg via ORAL
  Filled 2014-02-24 (×8): qty 1

## 2014-02-24 MED ORDER — SODIUM CHLORIDE 0.9 % IJ SOLN
INTRAMUSCULAR | Status: AC
  Filled 2014-02-24: qty 10

## 2014-02-24 MED ORDER — BUPIVACAINE HCL (PF) 0.5 % IJ SOLN
INTRAMUSCULAR | Status: DC | PRN
  Administered 2014-02-24: 20 mL

## 2014-02-24 MED ORDER — PNEUMOCOCCAL VAC POLYVALENT 25 MCG/0.5ML IJ INJ
0.5000 mL | INJECTION | INTRAMUSCULAR | Status: AC
  Administered 2014-02-26: 0.5 mL via INTRAMUSCULAR
  Filled 2014-02-24: qty 0.5

## 2014-02-24 MED ORDER — LEVOTHYROXINE SODIUM 50 MCG PO TABS
50.0000 ug | ORAL_TABLET | Freq: Every day | ORAL | Status: DC
  Administered 2014-02-24 – 2014-02-27 (×4): 50 ug via ORAL
  Filled 2014-02-24 (×5): qty 1

## 2014-02-24 MED ORDER — ONDANSETRON HCL 4 MG/2ML IJ SOLN
INTRAMUSCULAR | Status: DC | PRN
  Administered 2014-02-24: 4 mg via INTRAVENOUS

## 2014-02-24 MED ORDER — PROPOFOL 10 MG/ML IV BOLUS
INTRAVENOUS | Status: AC
  Filled 2014-02-24: qty 20

## 2014-02-24 MED ORDER — LIDOCAINE HCL (CARDIAC) 20 MG/ML IV SOLN
INTRAVENOUS | Status: DC | PRN
  Administered 2014-02-24: 60 mg via INTRAVENOUS

## 2014-02-24 MED ORDER — FENTANYL CITRATE 0.05 MG/ML IJ SOLN
INTRAMUSCULAR | Status: DC | PRN
  Administered 2014-02-24: 100 ug via INTRAVENOUS
  Administered 2014-02-24: 50 ug via INTRAVENOUS

## 2014-02-24 MED ORDER — BISACODYL 5 MG PO TBEC: 5.0000 mg | DELAYED_RELEASE_TABLET | Freq: Every day | ORAL | Status: DC | PRN

## 2014-02-24 MED ORDER — INSULIN ASPART 100 UNIT/ML ~~LOC~~ SOLN
0.0000 [IU] | SUBCUTANEOUS | Status: DC
  Administered 2014-02-24 – 2014-02-25 (×4): 2 [IU] via SUBCUTANEOUS
  Administered 2014-02-25: 3 [IU] via SUBCUTANEOUS
  Administered 2014-02-25 – 2014-02-26 (×4): 2 [IU] via SUBCUTANEOUS

## 2014-02-24 MED ORDER — PIOGLITAZONE HCL 30 MG PO TABS
30.0000 mg | ORAL_TABLET | Freq: Every day | ORAL | Status: DC
Start: 2014-02-24 — End: 2014-02-24

## 2014-02-24 MED ORDER — ROCURONIUM BROMIDE 100 MG/10ML IV SOLN
INTRAVENOUS | Status: DC | PRN
  Administered 2014-02-24: 30 mg via INTRAVENOUS
  Administered 2014-02-24: 10 mg via INTRAVENOUS

## 2014-02-24 MED ORDER — CEFAZOLIN SODIUM-DEXTROSE 2-3 GM-% IV SOLR
2.0000 g | Freq: Four times a day (QID) | INTRAVENOUS | Status: AC
  Administered 2014-02-24 – 2014-02-25 (×2): 2 g via INTRAVENOUS
  Filled 2014-02-24 (×2): qty 50

## 2014-02-24 MED ORDER — DEXAMETHASONE 4 MG PO TABS: 4.0000 mg | ORAL_TABLET | Freq: Two times a day (BID) | ORAL | Status: DC

## 2014-02-24 MED ORDER — POTASSIUM CHLORIDE 10 MEQ/100ML IV SOLN
10.0000 meq | INTRAVENOUS | Status: AC
Start: 2014-02-24 — End: 2014-02-24
  Administered 2014-02-24 (×2): 10 meq via INTRAVENOUS
  Filled 2014-02-24 (×2): qty 100

## 2014-02-24 MED ORDER — HYDROCODONE-ACETAMINOPHEN 5-325 MG PO TABS: 1.0000 | ORAL_TABLET | Freq: Four times a day (QID) | ORAL | Status: DC | PRN

## 2014-02-24 MED ORDER — METFORMIN HCL ER 500 MG PO TB24: 500.0000 mg | ORAL_TABLET | Freq: Two times a day (BID) | ORAL | Status: DC

## 2014-02-24 MED ORDER — METHOCARBAMOL 500 MG PO TABS
500.0000 mg | ORAL_TABLET | Freq: Four times a day (QID) | ORAL | Status: DC | PRN
  Administered 2014-02-24 – 2014-02-26 (×4): 500 mg via ORAL
  Filled 2014-02-24 (×4): qty 1

## 2014-02-24 MED ORDER — ATORVASTATIN CALCIUM 10 MG PO TABS
10.0000 mg | ORAL_TABLET | Freq: Every day | ORAL | Status: DC
  Administered 2014-02-24 – 2014-02-27 (×4): 10 mg via ORAL
  Filled 2014-02-24 (×4): qty 1

## 2014-02-24 MED ORDER — POTASSIUM CHLORIDE CRYS ER 20 MEQ PO TBCR: 20.0000 meq | EXTENDED_RELEASE_TABLET | Freq: Every day | ORAL | Status: DC

## 2014-02-24 MED ORDER — EPHEDRINE SULFATE 50 MG/ML IJ SOLN
INTRAMUSCULAR | Status: AC
  Filled 2014-02-24: qty 1

## 2014-02-24 MED ORDER — ONDANSETRON HCL 4 MG/2ML IJ SOLN
INTRAMUSCULAR | Status: AC
  Filled 2014-02-24: qty 2

## 2014-02-24 MED ORDER — DOCUSATE SODIUM 100 MG PO CAPS
100.0000 mg | ORAL_CAPSULE | Freq: Two times a day (BID) | ORAL | Status: DC
  Administered 2014-02-24 – 2014-02-27 (×6): 100 mg via ORAL
  Filled 2014-02-24 (×8): qty 1

## 2014-02-24 MED ORDER — LISINOPRIL 20 MG PO TABS
20.0000 mg | ORAL_TABLET | Freq: Every day | ORAL | Status: DC
  Administered 2014-02-24 – 2014-02-27 (×4): 20 mg via ORAL
  Filled 2014-02-24 (×4): qty 1

## 2014-02-24 MED ORDER — LINAGLIPTIN 5 MG PO TABS: 5.0000 mg | ORAL_TABLET | Freq: Every day | ORAL | Status: DC

## 2014-02-24 MED ORDER — GLYCOPYRROLATE 0.2 MG/ML IJ SOLN
INTRAMUSCULAR | Status: AC
  Filled 2014-02-24: qty 2

## 2014-02-24 MED ORDER — GLYCOPYRROLATE 0.2 MG/ML IJ SOLN
INTRAMUSCULAR | Status: DC | PRN
  Administered 2014-02-24: .4 mg via INTRAVENOUS

## 2014-02-24 MED ORDER — CEFAZOLIN SODIUM-DEXTROSE 2-3 GM-% IV SOLR
INTRAVENOUS | Status: AC
  Administered 2014-02-24: 2 g via INTRAVENOUS
  Filled 2014-02-24: qty 50

## 2014-02-24 MED ORDER — NEOSTIGMINE METHYLSULFATE 10 MG/10ML IV SOLN
INTRAVENOUS | Status: AC
  Filled 2014-02-24: qty 1

## 2014-02-24 MED ORDER — PROPOFOL 10 MG/ML IV BOLUS
INTRAVENOUS | Status: DC | PRN
  Administered 2014-02-24: 160 mg via INTRAVENOUS

## 2014-02-24 MED ORDER — LISINOPRIL-HYDROCHLOROTHIAZIDE 20-12.5 MG PO TABS: 1.0000 | ORAL_TABLET | Freq: Every day | ORAL | Status: DC

## 2014-02-24 MED ORDER — AMIODARONE HCL 200 MG PO TABS
200.0000 mg | ORAL_TABLET | Freq: Every day | ORAL | Status: DC
  Administered 2014-02-24 – 2014-02-27 (×4): 200 mg via ORAL
  Filled 2014-02-24 (×4): qty 1

## 2014-02-24 MED ORDER — FERROUS SULFATE 325 (65 FE) MG PO TABS
325.0000 mg | ORAL_TABLET | Freq: Every day | ORAL | Status: DC
  Administered 2014-02-24 – 2014-02-27 (×4): 325 mg via ORAL
  Filled 2014-02-24 (×5): qty 1

## 2014-02-24 MED ORDER — AMLODIPINE BESYLATE 5 MG PO TABS
5.0000 mg | ORAL_TABLET | Freq: Every day | ORAL | Status: DC
  Administered 2014-02-24 – 2014-02-27 (×4): 5 mg via ORAL
  Filled 2014-02-24 (×4): qty 1

## 2014-02-24 MED ORDER — FUROSEMIDE 20 MG PO TABS: 40.0000 mg | ORAL_TABLET | Freq: Every day | ORAL | Status: DC

## 2014-02-24 MED ORDER — LACTATED RINGERS IV SOLN
INTRAVENOUS | Status: DC | PRN
  Administered 2014-02-24: 12:00:00 via INTRAVENOUS

## 2014-02-24 MED ORDER — 0.9 % SODIUM CHLORIDE (POUR BTL) OPTIME
TOPICAL | Status: DC | PRN
  Administered 2014-02-24: 1000 mL

## 2014-02-24 SURGICAL SUPPLY — 52 items
BIT DRILL 4.3MMS DISTAL GRDTED (BIT) IMPLANT
BLADE SURG ROTATE 9660 (MISCELLANEOUS) ×2 IMPLANT
BNDG COHESIVE 6X5 TAN STRL LF (GAUZE/BANDAGES/DRESSINGS) ×1 IMPLANT
CORTICAL BONE SCR 5.0MM X 46MM (Screw) ×2 IMPLANT
COVER MAYO STAND STRL (DRAPES) ×1 IMPLANT
COVER PERINEAL POST (MISCELLANEOUS) ×2 IMPLANT
COVER SURGICAL LIGHT HANDLE (MISCELLANEOUS) ×4 IMPLANT
DECANTER SPIKE VIAL GLASS SM (MISCELLANEOUS) ×2 IMPLANT
DRAPE STERI IOBAN 125X83 (DRAPES) ×2 IMPLANT
DRILL 4.3MMS DISTAL GRADUATED (BIT) ×2
DRSG MEPILEX BORDER 4X4 (GAUZE/BANDAGES/DRESSINGS) ×5 IMPLANT
DRSG MEPILEX BORDER 4X8 (GAUZE/BANDAGES/DRESSINGS) ×2 IMPLANT
DURAPREP 26ML APPLICATOR (WOUND CARE) ×2 IMPLANT
ELECT CAUTERY BLADE 6.4 (BLADE) ×2 IMPLANT
ELECT REM PT RETURN 9FT ADLT (ELECTROSURGICAL) ×2
ELECTRODE REM PT RTRN 9FT ADLT (ELECTROSURGICAL) ×1 IMPLANT
EVACUATOR 1/8 PVC DRAIN (DRAIN) IMPLANT
GAUZE XEROFORM 5X9 LF (GAUZE/BANDAGES/DRESSINGS) ×2 IMPLANT
GLOVE BIO SURGEON STRL SZ7 (GLOVE) ×1 IMPLANT
GLOVE BIOGEL PI IND STRL 7.5 (GLOVE) IMPLANT
GLOVE BIOGEL PI IND STRL 8 (GLOVE) ×2 IMPLANT
GLOVE BIOGEL PI INDICATOR 7.5 (GLOVE) ×1
GLOVE BIOGEL PI INDICATOR 8 (GLOVE) ×2
GLOVE ECLIPSE 7.5 STRL STRAW (GLOVE) ×6 IMPLANT
GOWN STRL REUS W/ TWL LRG LVL3 (GOWN DISPOSABLE) ×1 IMPLANT
GOWN STRL REUS W/ TWL XL LVL3 (GOWN DISPOSABLE) ×2 IMPLANT
GOWN STRL REUS W/TWL LRG LVL3 (GOWN DISPOSABLE) ×2
GOWN STRL REUS W/TWL XL LVL3 (GOWN DISPOSABLE) ×4
GUIDEWIRE BALL NOSE 100CM (WIRE) ×1 IMPLANT
HFN LAG SCREW 10.5MM X 115MM (Orthopedic Implant) ×1 IMPLANT
HFN LH 130 DEG 13MM X 360MM (Nail) ×1 IMPLANT
KIT BASIN OR (CUSTOM PROCEDURE TRAY) ×2 IMPLANT
KIT ROOM TURNOVER OR (KITS) ×2 IMPLANT
LINER BOOT UNIVERSAL DISP (MISCELLANEOUS) ×1 IMPLANT
MANIFOLD NEPTUNE II (INSTRUMENTS) ×1 IMPLANT
NDL HYPO 25GX1X1/2 BEV (NEEDLE) IMPLANT
NEEDLE HYPO 25GX1X1/2 BEV (NEEDLE) ×2 IMPLANT
NS IRRIG 1000ML POUR BTL (IV SOLUTION) ×2 IMPLANT
PACK GENERAL/GYN (CUSTOM PROCEDURE TRAY) ×2 IMPLANT
PAD ABD 8X10 STRL (GAUZE/BANDAGES/DRESSINGS) ×2 IMPLANT
PAD ARMBOARD 7.5X6 YLW CONV (MISCELLANEOUS) ×6 IMPLANT
PIN GUIDE 3.2 903003004 (MISCELLANEOUS) ×2 IMPLANT
SCREW BONE CORTICAL 5.0X42 (Screw) ×1 IMPLANT
SCREW CORTICL BON 5.0MM X 46MM (Screw) IMPLANT
STAPLER VISISTAT 35W (STAPLE) ×2 IMPLANT
SUT VIC AB 0 CTB1 27 (SUTURE) ×4 IMPLANT
SUT VIC AB 1 CTB1 27 (SUTURE) ×2 IMPLANT
SUT VIC AB 2-0 CTB1 (SUTURE) ×2 IMPLANT
SYR CONTROL 10ML LL (SYRINGE) ×1 IMPLANT
TOWEL OR 17X24 6PK STRL BLUE (TOWEL DISPOSABLE) ×2 IMPLANT
TOWEL OR 17X26 10 PK STRL BLUE (TOWEL DISPOSABLE) ×2 IMPLANT
WATER STERILE IRR 1000ML POUR (IV SOLUTION) ×2 IMPLANT

## 2014-02-24 NOTE — Anesthesia Procedure Notes (Signed)
Procedure Name: Intubation Date/Time: 02/24/2014 12:36 PM Performed by: Izora Gala Pre-anesthesia Checklist: Patient identified, Emergency Drugs available, Suction available and Patient being monitored Patient Re-evaluated:Patient Re-evaluated prior to inductionOxygen Delivery Method: Circle system utilized Preoxygenation: Pre-oxygenation with 100% oxygen Intubation Type: IV induction Ventilation: Mask ventilation without difficulty Laryngoscope Size: Miller and 3 Grade View: Grade I Tube type: Oral Number of attempts: 1 Airway Equipment and Method: Stylet Placement Confirmation: ETT inserted through vocal cords under direct vision,  positive ETCO2,  CO2 detector and breath sounds checked- equal and bilateral (CO2 not wave not displaying on screen.  BBS tubing exchanged  correcting  problem.  CO2 detector used. by Dr. Oletta Lamas) Secured at: 23 cm Tube secured with: Tape Dental Injury: Teeth and Oropharynx as per pre-operative assessment

## 2014-02-24 NOTE — Progress Notes (Signed)
INITIAL NUTRITION ASSESSMENT  DOCUMENTATION CODES Per approved criteria  -Not Applicable   INTERVENTION: -Recommend Glucerna shake once daily with diet advancement -Encouraged intake of supplement and balanced meals to assist in meeting post op needs with compliance to regimen upon d/c -RD to continue to monitor  NUTRITION DIAGNOSIS: Increased nutrient needs related to recovery/post op needs as evidenced by hip fx with pending procedure.   Goal: Pt to meet >/= 90% of their estimated nutrition needs    Monitor:  Diet order, total protein/energy intake, labs, weights  Reason for Assessment: Consult  80 y.o. female  Admitting Dx: Closed left hip fracture  ASSESSMENT: Whitney [name] is a 80 y.o. female who presents to the ED after a mechanical fall that occurred just PTA. She went to turn her stove off and lost balance resulting in fall (no LOC, no head trauma). After the fall she had L hip pain and deformity, worse with movement, better with remaining still.   -Pt's family denied any changes in appetite pta, pt reported to have very healthy appetite at home and consumes 2-3 meals daily -Weight has remained stable ~190 lbs, pt and family denied any unintentional wt loss -Pt NPO pending procedure for left hip fx -Discussed importance of appropriate nutrition post op for assistance in recovery and healing -Pt willing to trial supplement once daily w/diet advancement -Discussed supplement alternatives to utilize upon d/c -Elevated CBG d/t hx of DM2, will recommend Glucerna shake as pt with good appetite -No signs of muscle wasting or fat loss  Height: Ht Readings from Last 1 Encounters:  02/24/14 5\' 6"  (1.676 m)    Weight: Wt Readings from Last 1 Encounters:  02/24/14 185 lb 6.4 oz (84.097 kg)    Ideal Body Weight: 130 lb  % Ideal Body Weight: 142%  Wt Readings from Last 10 Encounters:  02/24/14 185 lb 6.4 oz (84.097 kg)  02/16/14 192 lb 6.4 oz (87.272 kg)   02/14/14 194 lb 6.4 oz (88.179 kg)  01/30/14 195 lb 4.8 oz (88.587 kg)  01/30/14 196 lb (88.905 kg)  01/26/14 196 lb (88.905 kg)  01/09/14 194 lb (87.998 kg)  01/01/14 194 lb (87.998 kg)  12/22/13 194 lb 6.4 oz (88.179 kg)  12/21/13 196 lb (88.905 kg)    Usual Body Weight: ~190 lb  % Usual Body Weight: 97%  BMI:  Body mass index is 29.94 kg/(m^2).  Estimated Nutritional Needs: Kcal: 1650-1850 Protein: 85-95 gram Fluid: >/= 1700 ml daily  Skin: WDL  Diet Order: Diet NPO time specified  EDUCATION NEEDS: -Education needs addressed   Intake/Output Summary (Last 24 hours) at 02/24/14 1002 Last data filed at 02/24/14 8889  Gross per 24 hour  Intake      0 ml  Output      0 ml  Net      0 ml    Last BM: 12/11   Labs:   Recent Labs Lab 02/24/14 0042  NA 142  K 3.0*  CL 98  CO2 27  BUN 28*  CREATININE 1.12*  CALCIUM 10.1  GLUCOSE 176*    CBG (last 3)   Recent Labs  02/24/14 0626 02/24/14 0811  GLUCAP 157* 165*    Scheduled Meds: . amiodarone  200 mg Oral Daily  . amLODipine  5 mg Oral Daily  . atorvastatin  10 mg Oral Daily  . ferrous sulfate  325 mg Oral Q breakfast  . hydrochlorothiazide  12.5 mg Oral Daily  . insulin aspart  0-9  Units Subcutaneous 6 times per day  . levothyroxine  50 mcg Oral QAC breakfast  . lisinopril  20 mg Oral Daily  . metoprolol  100 mg Oral BID  . [START ON 02/25/2014] pneumococcal 23 valent vaccine  0.5 mL Intramuscular Tomorrow-1000    Continuous Infusions: . sodium chloride 75 mL/hr at 02/24/14 6967    Past Medical History  Diagnosis Date  . Hypertension   . Hyperlipidemia   . Diabetes mellitus without complication   . Thyroid disease     hypothyroidism  . Anemia   . Former smoker   . Multinodular goiter   . Complication of anesthesia     slow to wake up  . Cancer     right breast  . Wears glasses   . Full dentures   . Dysrhythmia 10/15    AF  . Breast cancer 10/2013    right upper outer  .  Atrial fibrillation     Past Surgical History  Procedure Laterality Date  . Abdominal hysterectomy    . Portacath placement N/A 11/15/2013    Procedure: INSERTION PORT-A-CATH WITH ULTRA SOUND AND FLOROSCOPY;  Surgeon: [name], MD;  Location: [facility];  Service: General;  Laterality: N/A;  . Eye surgery  12/22/2009    cataracts  . Total knee arthroplasty  2002    left  . Port-a-cath removal Right 01/09/2014    Procedure: REMOVAL PORT-A-CATH;  Surgeon: [name], MD;  Location: [facility];  Service: General;  Laterality: Right;  . Breast lumpectomy with radioactive seed localization Right 01/09/2014    Procedure: RIGHT BREAST SEED LOCALIZED LUMPECTOMY ;  Surgeon: [name], MD;  Location: [facility];  Service: General;  Laterality: Right;  . Axillary lymph node dissection Right 01/09/2014    Procedure: RIGHT AXILLARY LYMPH NODE DISECTION;  Surgeon: [name], MD;  Location: [facility];  Service: General;  Laterality: Right;    Atlee Abide MS RD LDN Clinical Dietitian [phone]

## 2014-02-24 NOTE — Consult Note (Signed)
Reason for Consult:left hip fracture Referring Physician: hospital course  Whitney [name] is an 80 y.o. female.  HPI: the patient fell in the evening and suffered a comminuted intercondylar/subtrochanteric fracture on the left side.  She is admitted to the medicine service and we are consult for management of the hip fracture.  The patient is a known diabetic and is currently on anticoagulation.  I was initially told that she was on 2 different anticoagulants and took this medication in the evening.  There seems to be some confusion as to whether she is on a single anticoagulant (Xeralto)or on 2 different anticoagulants.  Given this confusion and the concern I have that she might need open reduction versus percutaneous or essentially percutaneous fixation, I think we need to see the INR normalized to a greater degree.  Past Medical History  Diagnosis Date  . Hypertension   . Hyperlipidemia   . Diabetes mellitus without complication   . Thyroid disease     hypothyroidism  . Anemia   . Former smoker   . Multinodular goiter   . Complication of anesthesia     slow to wake up  . Cancer     right breast  . Wears glasses   . Full dentures   . Dysrhythmia 10/15    AF  . Breast cancer 10/2013    right upper outer  . Atrial fibrillation     Past Surgical History  Procedure Laterality Date  . Abdominal hysterectomy    . Portacath placement N/A 11/15/2013    Procedure: INSERTION PORT-A-CATH WITH ULTRA SOUND AND FLOROSCOPY;  Surgeon: [name], MD;  Location: [facility];  Service: General;  Laterality: N/A;  . Eye surgery  12/22/2009    cataracts  . Total knee arthroplasty  2002    left  . Port-a-cath removal Right 01/09/2014    Procedure: REMOVAL PORT-A-CATH;  Surgeon: [name], MD;  Location: [facility];  Service: General;  Laterality: Right;  . Breast lumpectomy with radioactive seed localization Right 01/09/2014    Procedure: RIGHT BREAST SEED LOCALIZED LUMPECTOMY  ;  Surgeon: [name], MD;  Location: [facility];  Service: General;  Laterality: Right;  . Axillary lymph node dissection Right 01/09/2014    Procedure: RIGHT AXILLARY LYMPH NODE DISECTION;  Surgeon: [name], MD;  Location: [facility];  Service: General;  Laterality: Right;    No family history on file.  Social History:  reports that she quit smoking about 55 years ago. She has never used smokeless tobacco. She reports that she does not drink alcohol or use illicit drugs.  Allergies: No Known Allergies  Medications: I have reviewed the patient's current medications.  Results for orders placed or performed during the hospital encounter of 02/23/14 (from the past 48 hour(s))  Urinalysis, Routine w reflex microscopic     Status: Abnormal   Collection Time: 02/24/14 12:24 AM  Result Value Ref Range   Color, Urine YELLOW YELLOW   APPearance CLEAR CLEAR   Specific Gravity, Urine 1.015 1.005 - 1.030   pH 6.0 5.0 - 8.0   Glucose, UA NEGATIVE NEGATIVE mg/dL   Hgb urine dipstick NEGATIVE NEGATIVE   Bilirubin Urine NEGATIVE NEGATIVE   Ketones, ur 15 (A) NEGATIVE mg/dL   Protein, ur NEGATIVE NEGATIVE mg/dL   Urobilinogen, UA 0.2 0.0 - 1.0 mg/dL   Nitrite NEGATIVE NEGATIVE   Leukocytes, UA NEGATIVE NEGATIVE    Comment: MICROSCOPIC NOT DONE ON URINES WITH NEGATIVE PROTEIN,  BLOOD, LEUKOCYTES, NITRITE, OR GLUCOSE <1000 mg/dL.  Type and screen     Status: None   Collection Time: 02/24/14 12:32 AM  Result Value Ref Range   ABO/RH(D) O POS    Antibody Screen NEG    Sample Expiration 51/04/5850   Basic metabolic panel     Status: Abnormal   Collection Time: 02/24/14 12:42 AM  Result Value Ref Range   Sodium 142 137 - 147 mEq/L   Potassium 3.0 (L) 3.7 - 5.3 mEq/L   Chloride 98 96 - 112 mEq/L   CO2 27 19 - 32 mEq/L   Glucose, Bld 176 (H) 70 - 99 mg/dL   BUN 28 (H) 6 - 23 mg/dL   Creatinine, Ser 1.12 (H) 0.50 - 1.10 mg/dL   Calcium 10.1 8.4 - 10.5  mg/dL   GFR calc non Af Amer 44 (L) >90 mL/min   GFR calc Af Amer 51 (L) >90 mL/min    Comment: (NOTE) The eGFR has been calculated using the CKD EPI equation. This calculation has not been validated in all clinical situations. eGFR's persistently <90 mL/min signify possible Chronic Kidney Disease.    Anion gap 17 (H) 5 - 15  CBC WITH DIFFERENTIAL     Status: Abnormal   Collection Time: 02/24/14 12:42 AM  Result Value Ref Range   WBC 6.9 4.0 - 10.5 K/uL   RBC 3.49 (L) 3.87 - 5.11 MIL/uL   Hemoglobin 10.0 (L) 12.0 - 15.0 g/dL   HCT 31.4 (L) 36.0 - 46.0 %   MCV 90.0 78.0 - 100.0 fL   MCH 28.7 26.0 - 34.0 pg   MCHC 31.8 30.0 - 36.0 g/dL   RDW 16.1 (H) 11.5 - 15.5 %   Platelets 254 150 - 400 K/uL   Neutrophils Relative % 86 (H) 43 - 77 %   Neutro Abs 5.9 1.7 - 7.7 K/uL   Lymphocytes Relative 8 (L) 12 - 46 %   Lymphs Abs 0.6 (L) 0.7 - 4.0 K/uL   Monocytes Relative 6 3 - 12 %   Monocytes Absolute 0.4 0.1 - 1.0 K/uL   Eosinophils Relative 0 0 - 5 %   Eosinophils Absolute 0.0 0.0 - 0.7 K/uL   Basophils Relative 0 0 - 1 %   Basophils Absolute 0.0 0.0 - 0.1 K/uL  Protime-INR     Status: Abnormal   Collection Time: 02/24/14 12:42 AM  Result Value Ref Range   Prothrombin Time 25.3 (H) 11.6 - 15.2 seconds   INR 2.28 (H) 0.00 - 1.49  Glucose, capillary     Status: Abnormal   Collection Time: 02/24/14  6:26 AM  Result Value Ref Range   Glucose-Capillary 157 (H) 70 - 99 mg/dL    Dg Chest 1 View  02/24/2014   CLINICAL DATA:  Preop chest for suspected left hip fracture from fall.  EXAM: CHEST - 1 VIEW  COMPARISON:  12/08/2013  FINDINGS: Borderline heart size with normal pulmonary vascularity. No focal airspace disease or consolidation in the lungs. No blunting of costophrenic angles. No pneumothorax. Calcified and tortuous aorta. Surgical clips in the left axilla. Interval removal of previous central venous catheter.  IMPRESSION: Borderline heart size.  No evidence of active pulmonary  disease.   Electronically Signed   By: Lucienne Capers M.D.   On: 02/24/2014 02:06   Dg Hip Complete Left  02/24/2014   CLINICAL DATA:  [place] from standing.  Left-sided hip pain.  EXAM: LEFT HIP - COMPLETE 2+ VIEW  COMPARISON:  None.  FINDINGS: Comminuted inter trochanteric and subtrochanteric fractures of the left proximal femur with varus angulation of the fracture fragments. Displaced lesser trochanteric fragment. No dislocation at the hip joint. Diffuse bone demineralization. Irregularity of the left superior and inferior pubic rami are nonspecific and may represent old or acute fractures.  IMPRESSION: Acute comminuted fracture of the inter trochanteric and subtrochanteric left hip with varus angulation. Age indeterminate fractures of the left pubic rami.   Electronically Signed   By: Lucienne Capers M.D.   On: 02/24/2014 01:26    ROS  ROS: I have reviewed the patient's review of systems thoroughly and there are no positive responses as relates to the HPI. Blood pressure 165/72, pulse 74, temperature 97.6 F (36.4 C), temperature source Oral, resp. rate 16, height 5' 6"  (1.676 m), weight 185 lb 6.4 oz (84.097 kg), SpO2 94 %. Physical Exam Well-developed well-nourished patient in no acute distress. Alert and oriented x3 HEENT:within normal limits Cardiac: Regular rate and rhythm Pulmonary: Lungs clear to auscultation Abdomen: Soft and nontender.  Normal active bowel sounds  Musculoskeletal: (left hip: Painful range of motion.  Obvious deformity.  Neurovascular intact distally. Assessment/Plan: 80 year old female with a history of diabetes on anticoagulation and fell and suffered a subtrochanteric/intratrochanteric fracture left side.  The patient will obviously need an intramedullary rod fixation for this fracture.  Not uncommonly intraoperatively the subtrochanteric portion was difficult to reduce and requires open reduction.  Given this concern would like to see her INR normalized to a  greater degree.  I will check her INR later today and if appropriate to consider her surgery around the 12 to 1:00 time frame.  If her INR does not normalized and the plan for surgical intervention Sunday morning.  I have had a prolonged discussion with the patient regarding the risk and benefits of the surgical procedure.  The patient understands the risks include but are not limited to bleeding, infection and failure of the surgery to cure the problem and need for further surgery.  The patient understands there is a slight risk of death at the time of surgery.  The patient understands these risks along with the potential benefits and wishes to proceed with surgical intervention.  The patient will be followed in the office in the postoperative period.  Maximus Hoffert L 02/24/2014, 7:30 AM

## 2014-02-24 NOTE — Progress Notes (Signed)
Patient seen and evaluated earlier this am by my associate. Please refer to his H and P for details regarding assessment and plan.  Orthopaedic surgery to evaluate and make further recommendations.  Will reassess next am  Whitney [name], Linward Foster

## 2014-02-24 NOTE — Anesthesia Preprocedure Evaluation (Addendum)
Anesthesia Evaluation  Patient identified by MRN, date of birth, ID band Patient awake    Reviewed: Allergy & Precautions  History of Anesthesia Complications Negative for: history of anesthetic complications  Airway Mallampati: I  TM Distance: >3 FB     Dental   Pulmonary former smoker,  breath sounds clear to auscultation  Pulmonary exam normal       Cardiovascular hypertension, Pt. on medications and Pt. on home beta blockers + dysrhythmias Atrial Fibrillation Rhythm:Irregular Rate:Normal     Neuro/Psych negative neurological ROS     GI/Hepatic negative GI ROS, Neg liver ROS,   Endo/Other  diabetes, Type 2Hypothyroidism   Renal/GU negative Renal ROS     Musculoskeletal   Abdominal (+)  Abdomen: soft.    Peds  Hematology negative hematology ROS (+)   Anesthesia Other Findings   Reproductive/Obstetrics                            Anesthesia Physical Anesthesia Plan  ASA: III  Anesthesia Plan: General   Post-op Pain Management:    Induction: Intravenous  Airway Management Planned: Oral ETT  Additional Equipment:   Intra-op Plan:   Post-operative Plan: Possible Post-op intubation/ventilation  Informed Consent: I have reviewed the patients History and Physical, chart, labs and discussed the procedure including the risks, benefits and alternatives for the proposed anesthesia with the patient or authorized representative who has indicated his/her understanding and acceptance.   Dental advisory given  Plan Discussed with: CRNA, Anesthesiologist and Surgeon  Anesthesia Plan Comments:        Anesthesia Quick Evaluation

## 2014-02-24 NOTE — Progress Notes (Signed)
Pt arrived to 5N16 via stretcher. Pt in severe pain but otherwise calm and cooperative. Oriented patient to room and equipment. VSS. Will continue to monitor.

## 2014-02-24 NOTE — ED Notes (Signed)
Transporting patient to new room assignment. 

## 2014-02-24 NOTE — Transfer of Care (Signed)
Immediate Anesthesia Transfer of Care Note  Patient: Whitney [name]  Procedure(s) Performed: Procedure(s): IM ROD LEFT HIP FX (Left)  Patient Location: PACU  Anesthesia Type:General  Level of Consciousness: awake, alert  and patient cooperative  Airway & Oxygen Therapy: Patient Spontanous Breathing, Patient connected to nasal cannula oxygen and Patient connected to face mask oxygen  Post-op Assessment: Report given to PACU RN, Post -op Vital signs reviewed and stable, Patient moving all extremities and Patient moving all extremities X 4  Post vital signs: Reviewed and stable  Complications: No apparent anesthesia complications

## 2014-02-24 NOTE — H&P (Signed)
[facility] History and Physical  TANDI HANKO LSL:[id number] DOB: 01-Oct-1927 DOA: 02/23/2014  Referring physician: EDP PCP: Odette Fraction, MD   Chief Complaint: Fall, hip pain   HPI: Whitney [name] is a 80 y.o. female who presents to the ED after a mechanical fall that occurred just PTA.  She went to turn her stove off and lost balance resulting in fall (no LOC, no head trauma).  After the fall she had L hip pain and deformity, worse with movement, better with remaining still.  This has been constant since the fall.  Left hip fracture found in ED.  Review of Systems: Systems reviewed.  As above, otherwise negative  Past Medical History  Diagnosis Date  . Hypertension   . Hyperlipidemia   . Diabetes mellitus without complication   . Thyroid disease     hypothyroidism  . Anemia   . Former smoker   . Multinodular goiter   . Complication of anesthesia     slow to wake up  . Cancer     right breast  . Wears glasses   . Full dentures   . Dysrhythmia 10/15    AF  . Breast cancer 10/2013    right upper outer  . Atrial fibrillation    Past Surgical History  Procedure Laterality Date  . Abdominal hysterectomy    . Portacath placement N/A 11/15/2013    Procedure: INSERTION PORT-A-CATH WITH ULTRA SOUND AND FLOROSCOPY;  Surgeon: [name], MD;  Location: [facility];  Service: General;  Laterality: N/A;  . Eye surgery  12/22/2009    cataracts  . Total knee arthroplasty  2002    left  . Port-a-cath removal Right 01/09/2014    Procedure: REMOVAL PORT-A-CATH;  Surgeon: [name], MD;  Location: [facility];  Service: General;  Laterality: Right;  . Breast lumpectomy with radioactive seed localization Right 01/09/2014    Procedure: RIGHT BREAST SEED LOCALIZED LUMPECTOMY ;  Surgeon: [name], MD;  Location: [facility];  Service: General;  Laterality: Right;  . Axillary lymph node dissection Right 01/09/2014    Procedure: RIGHT  AXILLARY LYMPH NODE DISECTION;  Surgeon: [name], MD;  Location: [facility];  Service: General;  Laterality: Right;   Social History:  reports that she quit smoking about 55 years ago. She has never used smokeless tobacco. She reports that she does not drink alcohol or use illicit drugs.  No Known Allergies  No family history on file.   Prior to Admission medications   Medication Sig Start Date End Date Taking? Authorizing Provider  amiodarone (PACERONE) 200 MG tablet Take 1 tablet (200 mg total) by mouth daily. 01/19/14  Yes [name], MD  amLODipine (NORVASC) 5 MG tablet Take 5 mg by mouth daily. 12/12/13  Yes Historical Provider, MD  apixaban (ELIQUIS) 5 MG TABS tablet Take 5 mg by mouth 2 (two) times daily.   Yes Historical Provider, MD  atorvastatin (LIPITOR) 10 MG tablet Take 10 mg by mouth daily.   Yes Historical Provider, MD  Cholecalciferol (VITAMIN D) 2000 UNITS CAPS Take 2,000 Units by mouth daily.   Yes Historical Provider, MD  dexamethasone (DECADRON) 4 MG tablet Take 4 mg by mouth 2 (two) times daily.  12/01/13  Yes Historical Provider, MD  ferrous sulfate 325 (65 FE) MG EC tablet Take 325 mg by mouth daily with breakfast.   Yes Historical Provider, MD  furosemide (LASIX) 40 MG tablet Take 40 mg by mouth  daily.  09/20/13  Yes Elayne Snare, MD  linagliptin (TRADJENTA) 5 MG TABS tablet Take 5 mg by mouth daily.   Yes Historical Provider, MD  lisinopril-hydrochlorothiazide (PRINZIDE,ZESTORETIC) 20-12.5 MG per tablet Take 1 tablet by mouth daily. 12/12/13  Yes Historical Provider, MD  metFORMIN (GLUCOPHAGE-XR) 500 MG 24 hr tablet Take 500-1,000 mg by mouth 2 (two) times daily. Take 500 mg every morning and 1000 mg every evening   Yes Historical Provider, MD  metoprolol (LOPRESSOR) 100 MG tablet TAKE 1 TABLET TWICE A DAY 02/12/14  Yes Elayne Snare, MD  pioglitazone (ACTOS) 30 MG tablet TAKE 1 TABLET (30 MG TOTAL) BY MOUTH DAILY. 02/10/14  Yes Elayne Snare, MD   Polyethyl Glycol-Propyl Glycol (SYSTANE OP) Place 1 drop into both eyes 3 (three) times daily.   Yes Historical Provider, MD  potassium chloride SA (K-DUR,KLOR-CON) 20 MEQ tablet Take 20 mEq by mouth daily.   Yes Historical Provider, MD  rivaroxaban (XARELTO) 20 MG TABS tablet Take 20 mg by mouth daily with supper.   Yes Historical Provider, MD  SYNTHROID 50 MCG tablet TAKE 1 TABLET (50 MCG TOTAL) BY MOUTH DAILY BEFORE BREAKFAST. 01/12/14  Yes Elayne Snare, MD  vitamin B-12 (CYANOCOBALAMIN) 1000 MCG tablet Take 1,000 mcg by mouth daily.   Yes Historical Provider, MD  prochlorperazine (COMPAZINE) 10 MG tablet Take 1 tablet (10 mg total) by mouth every 6 (six) hours as needed (Nausea or vomiting). Patient not taking: Reported on 02/14/2014 11/14/13   [name], MD   Physical Exam: Filed Vitals:   02/24/14 0400  BP: 147/61  Pulse: 75  Temp:   Resp: 18    BP 147/61 mmHg  Pulse 75  Temp(Src) 98.7 F (37.1 C) (Oral)  Resp 18  Ht 5' 7" (1.702 m)  Wt 87.091 kg (192 lb)  BMI 30.06 kg/m2  SpO2 93%  General Appearance:    Alert, oriented, no distress, appears stated age  Head:    Normocephalic, atraumatic  Eyes:    PERRL, EOMI, sclera non-icteric        Nose:   Nares without drainage or epistaxis. Mucosa, turbinates normal  Throat:   Moist mucous membranes. Oropharynx without erythema or exudate.  Neck:   Supple. No carotid bruits.  No thyromegaly.  No lymphadenopathy.   Back:     No CVA tenderness, no spinal tenderness  Lungs:     Clear to auscultation bilaterally, without wheezes, rhonchi or rales  Chest wall:    No tenderness to palpitation  Heart:    Regular rate and rhythm without murmurs, gallops, rubs  Abdomen:     Soft, non-tender, nondistended, normal bowel sounds, no organomegaly  Genitalia:    deferred  Rectal:    deferred  Extremities:   No clubbing, cyanosis or edema.  Pulses:   2+ and symmetric all extremities  Skin:   Skin color, texture, turgor normal, no rashes or  lesions  Lymph nodes:   Cervical, supraclavicular, and axillary nodes normal  Neurologic:   CNII-XII intact. Normal strength, sensation and reflexes      throughout    Labs on Admission:  Basic Metabolic Panel:  Recent Labs Lab 02/24/14 0042  NA 142  K 3.0*  CL 98  CO2 27  GLUCOSE 176*  BUN 28*  CREATININE 1.12*  CALCIUM 10.1   Liver Function Tests: No results for input(s): AST, ALT, ALKPHOS, BILITOT, PROT, ALBUMIN in the last 168 hours. No results for input(s): LIPASE, AMYLASE in the last 168 hours.  No results for input(s): AMMONIA in the last 168 hours. CBC:  Recent Labs Lab 02/24/14 0042  WBC 6.9  NEUTROABS 5.9  HGB 10.0*  HCT 31.4*  MCV 90.0  PLT 254   Cardiac Enzymes: No results for input(s): CKTOTAL, CKMB, CKMBINDEX, TROPONINI in the last 168 hours.  BNP (last 3 results) No results for input(s): PROBNP in the last 8760 hours. CBG: No results for input(s): GLUCAP in the last 168 hours.  Radiological Exams on Admission: Dg Chest 1 View  02/24/2014   CLINICAL DATA:  Preop chest for suspected left hip fracture from fall.  EXAM: CHEST - 1 VIEW  COMPARISON:  12/08/2013  FINDINGS: Borderline heart size with normal pulmonary vascularity. No focal airspace disease or consolidation in the lungs. No blunting of costophrenic angles. No pneumothorax. Calcified and tortuous aorta. Surgical clips in the left axilla. Interval removal of previous central venous catheter.  IMPRESSION: Borderline heart size.  No evidence of active pulmonary disease.   Electronically Signed   By: Lucienne Capers M.D.   On: 02/24/2014 02:06   Dg Hip Complete Left  02/24/2014   CLINICAL DATA:  [place] from standing.  Left-sided hip pain.  EXAM: LEFT HIP - COMPLETE 2+ VIEW  COMPARISON:  None.  FINDINGS: Comminuted inter trochanteric and subtrochanteric fractures of the left proximal femur with varus angulation of the fracture fragments. Displaced lesser trochanteric fragment. No dislocation at the  hip joint. Diffuse bone demineralization. Irregularity of the left superior and inferior pubic rami are nonspecific and may represent old or acute fractures.  IMPRESSION: Acute comminuted fracture of the inter trochanteric and subtrochanteric left hip with varus angulation. Age indeterminate fractures of the left pubic rami.   Electronically Signed   By: Lucienne Capers M.D.   On: 02/24/2014 01:26    EKG: Independently reviewed.  Assessment/Plan Principal Problem:   Closed left hip fracture Active Problems:   DM2 (diabetes mellitus, type 2)   Essential hypertension, benign   Breast cancer of upper-outer quadrant of right female breast   Chronic diastolic CHF (congestive heart failure), NYHA class 2   PAF (paroxysmal atrial fibrillation)   Anticoagulated   1. Closed left hip fracture - 1. Hold xeralto 2. NPO at surgery request, INR elevated they may recheck INR later today and try to get her done today if this normalizes, normally takes blood thinner at 10 am 3. On hip fracture pathway. 1. Pain control per pathway 4. Holding lasix and giving 75 cc/hr NS while NPO 2. DM2 - 1. Holding home PO hypoglycemics 2. SSI q4h low dose while NPO 3. PAF - 1. Continue rhythm control with Amiodarone PO, currently in NSR 2. Holding anticoagulation as noted above 4. HTN - continue home HTN meds 5. BRCA - undergoing treatment with cancer center, not on chemotherapy s/p surgery and doing radiation at this point instead.  Radiation therapy scheduled to start after Xmas this year.    Code Status: Full Code  Family Communication: Family at bedside Disposition Plan: Admit to inpatient   Time spent: 70 min  Dillion Stowers M. [facility] Pager [phone]  If 7AM-7PM, please contact the day team taking care of the patient [email or web address] Password [email or web address] 02/24/2014, 4:20 AM

## 2014-02-24 NOTE — Anesthesia Postprocedure Evaluation (Signed)
  Anesthesia Post-op Note  Patient: Whitney [name]  Procedure(s) Performed: Procedure(s): IM ROD LEFT HIP FX (Left)  Patient Location: PACU  Anesthesia Type:General  Level of Consciousness: awake  Airway and Oxygen Therapy: Patient Spontanous Breathing  Post-op Pain: mild  Post-op Assessment: Post-op Vital signs reviewed  Post-op Vital Signs: Reviewed  Last Vitals:  Filed Vitals:   02/24/14 1515  BP: 166/68  Pulse: 77  Temp:   Resp: 21    Complications: No apparent anesthesia complications

## 2014-02-24 NOTE — Brief Op Note (Signed)
02/23/2014 - 02/24/2014  2:30 PM  PATIENT:  Whitney [name]  80 y.o. female  PRE-OPERATIVE DIAGNOSIS:  hip fx  POST-OPERATIVE DIAGNOSIS:  hip fx  PROCEDURE:  Procedure(s): IM ROD LEFT HIP FX (Left)  SURGEON:  Surgeon(s) and Role:    * Alta Corning, MD - Primary  PHYSICIAN ASSISTANT:   ASSISTANTS: bethune   ANESTHESIA:   general  EBL:  Total I/O In: 250 [IV Piggyback:250] Out: 550 [Urine:300; Blood:250]  BLOOD ADMINISTERED:none  DRAINS: none   LOCAL MEDICATIONS USED:  NONE  SPECIMEN:  No Specimen  DISPOSITION OF SPECIMEN:  N/A  COUNTS:  YES  TOURNIQUET:  * No tourniquets in log *  DICTATION: .Other Dictation: Dictation Number [id number]  PLAN OF CARE: Admit to inpatient   PATIENT DISPOSITION:  PACU - hemodynamically stable.   Delay start of Pharmacological VTE agent (>24hrs) due to surgical blood loss or risk of bleeding: no

## 2014-02-24 NOTE — ED Notes (Signed)
Patient transported to X-ray 

## 2014-02-24 NOTE — Progress Notes (Signed)
Orthopedic Tech Progress Note Patient Details:  Whitney [name] 01-Dec-1927 [id number]  Ortho Devices Ortho Device/Splint Location: trapeze bar patient helper Ortho Device/Splint Interventions: Application   Hildred Priest 02/24/2014, 7:55 AM

## 2014-02-25 DIAGNOSIS — S72002S Fracture of unspecified part of neck of left femur, sequela: Secondary | ICD-10-CM

## 2014-02-25 DIAGNOSIS — D62 Acute posthemorrhagic anemia: Secondary | ICD-10-CM | POA: Diagnosis not present

## 2014-02-25 LAB — BASIC METABOLIC PANEL
Anion gap: 13 (ref 5–15)
BUN: 21 mg/dL (ref 6–23)
CO2: 28 mEq/L (ref 19–32)
Calcium: 8.7 mg/dL (ref 8.4–10.5)
Chloride: 98 mEq/L (ref 96–112)
Creatinine, Ser: 1.07 mg/dL (ref 0.50–1.10)
GFR calc Af Amer: 54 mL/min — ABNORMAL LOW (ref >90)
GFR calc non Af Amer: 46 mL/min — ABNORMAL LOW (ref >90)
Glucose, Bld: 153 mg/dL — ABNORMAL HIGH (ref 70–99)
Potassium: 3.1 mEq/L — ABNORMAL LOW (ref 3.7–5.3)
Sodium: 139 mEq/L (ref 137–147)

## 2014-02-25 LAB — GLUCOSE, CAPILLARY
Glucose-Capillary: 151 mg/dL — ABNORMAL HIGH (ref 70–99)
Glucose-Capillary: 165 mg/dL — ABNORMAL HIGH (ref 70–99)
Glucose-Capillary: 235 mg/dL — ABNORMAL HIGH (ref 70–99)
Glucose-Capillary: 162 mg/dL — ABNORMAL HIGH (ref 70–99)

## 2014-02-25 LAB — PREPARE RBC (CROSSMATCH)

## 2014-02-25 LAB — CBC
HCT: 22.9 % — ABNORMAL LOW (ref 36.0–46.0)
Hemoglobin: 7.5 g/dL — ABNORMAL LOW (ref 12.0–15.0)
MCH: 29.3 pg (ref 26.0–34.0)
MCHC: 32.8 g/dL (ref 30.0–36.0)
MCV: 89.5 fL (ref 78.0–100.0)
Platelets: 201 10*3/uL (ref 150–400)
RBC: 2.56 MIL/uL — ABNORMAL LOW (ref 3.87–5.11)
RDW: 16.3 % — ABNORMAL HIGH (ref 11.5–15.5)
WBC: 5.4 10*3/uL (ref 4.0–10.5)

## 2014-02-25 LAB — PROTIME-INR
INR: 1.5 — ABNORMAL HIGH (ref 0.00–1.49)
Prothrombin Time: 18.2 seconds — ABNORMAL HIGH (ref 11.6–15.2)

## 2014-02-25 MED ORDER — HYDROCODONE-ACETAMINOPHEN 5-325 MG PO TABS: 1.0000 | ORAL_TABLET | Freq: Four times a day (QID) | ORAL | Status: DC | PRN

## 2014-02-25 MED ORDER — POTASSIUM CHLORIDE CRYS ER 20 MEQ PO TBCR
40.0000 meq | EXTENDED_RELEASE_TABLET | Freq: Once | ORAL | Status: AC
  Administered 2014-02-25: 40 meq via ORAL
  Filled 2014-02-25: qty 2

## 2014-02-25 MED ORDER — SODIUM CHLORIDE 0.9 % IV SOLN
Freq: Once | INTRAVENOUS | Status: AC
  Administered 2014-02-25: 17:00:00 via INTRAVENOUS

## 2014-02-25 MED ORDER — RIVAROXABAN 15 MG PO TABS
15.0000 mg | ORAL_TABLET | Freq: Every day | ORAL | Status: DC
  Administered 2014-02-26 – 2014-02-27 (×2): 15 mg via ORAL
  Filled 2014-02-25 (×2): qty 1

## 2014-02-25 MED ORDER — RIVAROXABAN 20 MG PO TABS
20.0000 mg | ORAL_TABLET | Freq: Every day | ORAL | Status: DC
  Administered 2014-02-25: 20 mg via ORAL
  Filled 2014-02-25: qty 1

## 2014-02-25 NOTE — Evaluation (Signed)
Physical Therapy Evaluation Patient Details Name: Whitney [name] MRN: [id number] DOB: 1927/08/25 Today's Date: 02/25/2014   History of Present Illness  Admitted post fall resulting in L hip fx; now s/p ORIF with rod  Past Medical History  Diagnosis Date  . Hypertension   . Hyperlipidemia   . Diabetes mellitus without complication   . Thyroid disease     hypothyroidism  . Anemia   . Former smoker   . Multinodular goiter   . Complication of anesthesia     slow to wake up  . Cancer     right breast  . Wears glasses   . Full dentures   . Dysrhythmia 10/15    AF  . Breast cancer 10/2013    right upper outer  . Atrial fibrillation    Past Surgical History  Procedure Laterality Date  . Abdominal hysterectomy    . Portacath placement N/A 11/15/2013    Procedure: INSERTION PORT-A-CATH WITH ULTRA SOUND AND FLOROSCOPY;  Surgeon: [name], MD;  Location: [facility];  Service: General;  Laterality: N/A;  . Eye surgery  12/22/2009    cataracts  . Total knee arthroplasty  2002    left  . Port-a-cath removal Right 01/09/2014    Procedure: REMOVAL PORT-A-CATH;  Surgeon: [name], MD;  Location: [facility];  Service: General;  Laterality: Right;  . Breast lumpectomy with radioactive seed localization Right 01/09/2014    Procedure: RIGHT BREAST SEED LOCALIZED LUMPECTOMY ;  Surgeon: [name], MD;  Location: [facility];  Service: General;  Laterality: Right;  . Axillary lymph node dissection Right 01/09/2014    Procedure: RIGHT AXILLARY LYMPH NODE DISECTION;  Surgeon: [name], MD;  Location: [facility];  Service: General;  Laterality: Right;     Clinical Impression  Patient is s/p above surgery resulting in functional limitations due to the deficits listed below (see PT Problem List).  Patient will benefit from skilled PT to increase their independence and safety with mobility to allow discharge to the venue listed below.        Follow Up Recommendations SNF;Supervision/Assistance - 24 hour    Equipment Recommendations  Wheelchair (measurements PT);Wheelchair cushion (measurements PT);3in1 (PT) (consider drop-arm 3in1)    Recommendations for Other Services OT consult     Precautions / Restrictions Precautions Precautions: Fall Restrictions Weight Bearing Restrictions: Yes LLE Weight Bearing: Touchdown weight bearing      Mobility  Bed Mobility Overal bed mobility: Needs Assistance;+2 for physical assistance Bed Mobility: Supine to Sit     Supine to sit: +2 for physical assistance;Max assist     General bed mobility comments: Used bed pad to assist in scooting to EOB; max assist to support LLE and assist in elevating trunk to come to sit  Transfers Overall transfer level: Needs assistance Equipment used: Rolling walker (2 wheeled) Transfers: Sit to/from [employer] Transfers Sit to Stand: +2 physical assistance;Max assist Stand pivot transfers: +2 physical assistance;Max assist       General transfer comment: Cues for safety, ahnd placement and technique; Demo cues to "toe-heel" pivot on RLE to get to chair with UEs supported on RW; Extreme difficulty keeping TWB LLE  Ambulation/Gait             General Gait Details: unable to take steps keeping TWB LLE  Stairs            Wheelchair Mobility    Modified Rankin (Stroke Patients Only)  Balance Overall balance assessment: Needs assistance Sitting-balance support: Bilateral upper extremity supported Sitting balance-Leahy Scale: Fair     Standing balance support: Bilateral upper extremity supported Standing balance-Leahy Scale: Poor                               Pertinent Vitals/Pain Pain Assessment: 0-10 Pain Score: 10-Worst pain ever Pain Location: L hip with movement and during transfers; subsides quickly once pt is motionless again Pain Descriptors / Indicators: Aching;Grimacing Pain  Intervention(s): Limited activity within patient's tolerance;Monitored during session;Repositioned;Patient requesting pain meds-RN notified    Home Living Family/patient expects to be discharged to:: Skilled nursing facility Living Arrangements: Other relatives                    Prior Function Level of Independence: Independent (per PT note from 9/30)               Hand Dominance        Extremity/Trunk Assessment   Upper Extremity Assessment: Generalized weakness           Lower Extremity Assessment: LLE deficits/detail   LLE Deficits / Details: Decr aROM and strength, limited by pain postop     Communication   Communication: No difficulties  Cognition Arousal/Alertness: Awake/alert Behavior During Therapy: WFL for tasks assessed/performed Overall Cognitive Status: Within Functional Limits for tasks assessed                      General Comments      Exercises General Exercises - Lower Extremity Quad Sets: AROM;Left;5 reps Heel Slides: AAROM;Left;5 reps Hip ABduction/ADduction: AAROM;Left;5 reps      Assessment/Plan    PT Assessment Patient needs continued PT services  PT Diagnosis Difficulty walking;Acute pain   PT Problem List Decreased strength;Decreased range of motion;Decreased activity tolerance;Decreased balance;Decreased mobility;Decreased coordination;Decreased knowledge of use of DME;Decreased safety awareness;Pain;Decreased knowledge of precautions  PT Treatment Interventions DME instruction;Gait training;Functional mobility training;Therapeutic activities;Therapeutic exercise;Balance training;Patient/family education;Wheelchair mobility training   PT Goals (Current goals can be found in the Care Plan section) Acute Rehab PT Goals Patient Stated Goal: wants to get better PT Goal Formulation: With patient Time For Goal Achievement: 03/11/14 Potential to Achieve Goals: Good    Frequency Min 4X/week   Barriers to discharge         Co-evaluation               End of Session Equipment Utilized During Treatment: Gait belt Activity Tolerance: Patient tolerated treatment well Patient left: in chair;with call bell/phone within reach Nurse Communication: Mobility status         Time: 6010-9323 PT Time Calculation (min) (ACUTE ONLY): 16 min   Charges:   PT Evaluation $Initial PT Evaluation Tier I: 1 Procedure PT Treatments $Therapeutic Activity: 8-22 mins   PT G Codes:          Quin Hoop 02/25/2014, 11:31 AM  Roney Marion, PT  Acute Rehabilitation Services Pager [phone] Office [phone]

## 2014-02-25 NOTE — Progress Notes (Signed)
Subjective: 1 Day Post-Op Procedure(s) (LRB): IM ROD LEFT HIP FX (Left) Patient reports pain as moderate.  Mild dizziness. Up in chair. Daughter at bedside.  Objective: Vital signs in last 24 hours: Temp:  [97.6 F (36.4 C)-98.6 F (37 C)] 98.2 F (36.8 C) (12/13 0558) Pulse Rate:  [56-77] 61 (12/13 0558) Resp:  [17-21] 18 (12/13 0558) BP: (112-173)/(53-73) 120/58 mmHg (12/13 0558) SpO2:  [91 %-98 %] 97 % (12/13 0558)  Intake/Output from previous day: 12/12 0701 - 12/13 0700 In: 2805 [P.O.:520; I.V.:2035; IV Piggyback:250] Out: 1050 [Urine:800; Blood:250] Intake/Output this shift:     Recent Labs  02/24/14 0042 02/25/14 0438  HGB 10.0* 7.5*    Recent Labs  02/24/14 0042 02/25/14 0438  WBC 6.9 5.4  RBC 3.49* 2.56*  HCT 31.4* 22.9*  PLT 254 201    Recent Labs  02/24/14 1005 02/25/14 0438  NA 144 139  K 3.0* 3.1*  CL 101 98  CO2 30 28  BUN 22 21  CREATININE 0.86 1.07  GLUCOSE 154* 153*  CALCIUM 9.4 8.7    Recent Labs  02/24/14 1025 02/25/14 0438  INR 1.84* 1.50*   left hip exam:  Neurovascular intact Sensation intact distally Intact pulses distally Dorsiflexion/Plantar flexion intact Incision: dressing C/D/I No cellulitis present Compartment soft  Assessment/Plan: 1 Day Post-Op Procedure(s) (LRB): IM ROD LEFT HIP FX (Left) Acute blood loss anemia. Plan: Up with therapy Discharge to SNF in 1-2 days. Resume Xarelto Transfuse 2 units packed RBCs today. Anticipate her hemoglobin will drift down tomorrow postoperatively.  Christeen Lai G 02/25/2014, 10:20 AM

## 2014-02-25 NOTE — Op Note (Signed)
NAMEBRIANA, FARNER             ACCOUNT NO.:  [id number]  MEDICAL RECORD NO.:  [id number]  LOCATION:  [id number]                        FACILITY:  [facility]  PHYSICIAN:  Alta Corning, M.D.   DATE OF BIRTH:  August 18, 1927  DATE OF PROCEDURE:  02/24/2014 DATE OF DISCHARGE:                              OPERATIVE REPORT   PREOPERATIVE DIAGNOSIS:  Subtrochanteric hip fracture, left.  POSTOPERATIVE DIAGNOSIS:  Subtrochanteric hip fracture, left.  PRINCIPAL PROCEDURE:  Intramedullary rod fixation of subtrochanteric hip fracture with a 13 x 360 mm nail __________locked in static position proximally.  SURGEON:  Alta Corning, M.D.  ASSISTANT:  Gary Fleet, P.A.  ANESTHESIA:  General.  BRIEF HISTORY:  Mrs. Pavlock is an 80 year old female with a long history of significant complaints of left hip pain after a fall.  Imaging showed that she had a significant intertroch-subtroch fracture and after evaluation and discussion with the family, she was taken to the operating room for intramedullary rod fixation.  DESCRIPTION OF PROCEDURE:  The patient was taken to the operating room. After adequate general anesthesia was obtained by general anesthetic, the patient was placed supine on the operating table. She was placed under the traction, Jackson fracture table and the left hip was prepped and draped in usual sterile fashion.  Following this, manipulative closed reduction was undertaken and we got a near-anatomic reduction of the fracture site, and at this point, we got a guidewire into the tip of the trochanter.  This was then advanced down to the knee and this was over-reamed with an introductory reamer and then, we presented a 14.5__________ reamer down the femoral shaft.  Minimal chatter was achieved and then took a 13 x 360 mm rod and advanced it down, the hip stayed in a near-anatomic reduction, put a central screw in, 115 mm into the dead center portion of the head on both AP and lateral  and then locked the screws distally with a freehand technique.  At this point, the wounds were copiously irrigated and suctioned dry, closed in layers. Sterile compressive dressings were applied.  The patient was taken to the recovery, she was noted to be in satisfactory condition.  Multiple intraoperative fluoroscopic images were taken during the case to ensure appropriate position and adequate reduction.  Estimated blood loss for procedure was 200 mL.     Alta Corning, M.D.     Corliss Skains  D:  02/24/2014  T:  02/24/2014  Job:  [id number]

## 2014-02-25 NOTE — Progress Notes (Signed)
[facility] PROGRESS NOTE  Whitney [name] QZR:[id number] DOB: [birth year] DOA: 02/23/2014 PCP: Odette Fraction, MD  Assessment/Plan: Principal Problem:   Closed left hip fracture - Orthopedic surgery assisting case - Continue pain control - Physical therapy evaluation  Active Problems:   DM2 (diabetes mellitus, type 2) - Continue diabetic diet -Patient is currently on sliding scale insulin -Continue to monitor CBGs    Essential hypertension, benign - Patient is currently on amlodipine, hydrochlorothiazide seen, lisinopril, metoprolol - Blood pressure relatively well controlled on this regimen    Breast cancer of upper-outer quadrant of right female breast - Patient follow-up with oncologist or PCP physician for further coordinated care as outpatient    Chronic diastolic CHF (congestive heart failure), NYHA class 2 - Stable currently, on lisinopril and beta blocker    PAF (paroxysmal atrial fibrillation) - On amiodarone and metoprolol - Anticoagulated on xarelto  Hypokalemia -New problems -We'll replace orally and reassess   Code Status: full Family Communication: discussed directly with patient Disposition Plan: Pending recommendations from orthopaedic surgeons and PT   Consultants:  Orthopedic surgery  Procedures: 1 Day Post-Op Procedure(s) (LRB): IM ROD LEFT HIP FX (Left)  Antibiotics:  None  HPI/Subjective: The patient has no new complaints.  Objective: Filed Vitals:   02/25/14 0558  BP: 120/58  Pulse: 61  Temp: 98.2 F (36.8 C)  Resp: 18    Intake/Output Summary (Last 24 hours) at 02/25/14 1259 Last data filed at 02/25/14 0500  Gross per 24 hour  Intake   2805 ml  Output    750 ml  Net   2055 ml   Filed Weights   02/23/14 2327 02/24/14 0552  Weight: 87.091 kg (192 lb) 84.097 kg (185 lb 6.4 oz)    Exam:   General:  Patient in no acute distress, alert and awake  Cardiovascular: S1 and S2 present, no  murmurs  Respiratory: Clear to auscultation bilaterally, no wheezes  Abdomen: Soft, nontender, nondistended  Musculoskeletal: No  clubbing   Data Reviewed: Basic Metabolic Panel:  Recent Labs Lab 02/24/14 0042 02/24/14 1005 02/25/14 0438  NA 142 144 139  K 3.0* 3.0* 3.1*  CL 98 101 98  CO2 27 30 28   GLUCOSE 176* 154* 153*  BUN 28* 22 21  CREATININE 1.12* 0.86 1.07  CALCIUM 10.1 9.4 8.7   Liver Function Tests: No results for input(s): AST, ALT, ALKPHOS, BILITOT, PROT, ALBUMIN in the last 168 hours. No results for input(s): LIPASE, AMYLASE in the last 168 hours. No results for input(s): AMMONIA in the last 168 hours. CBC:  Recent Labs Lab 02/24/14 0042 02/25/14 0438  WBC 6.9 5.4  NEUTROABS 5.9  --   HGB 10.0* 7.5*  HCT 31.4* 22.9*  MCV 90.0 89.5  PLT 254 201   Cardiac Enzymes: No results for input(s): CKTOTAL, CKMB, CKMBINDEX, TROPONINI in the last 168 hours. BNP (last 3 results) No results for input(s): PROBNP in the last 8760 hours. CBG:  Recent Labs Lab 02/24/14 1112 02/24/14 1459 02/24/14 1658 02/24/14 2131 02/25/14 0405  GLUCAP 136* 129* 198* 193* 151*    No results found for this or any previous visit (from the past 240 hour(s)).   Studies: Dg Chest 1 View  02/24/2014   CLINICAL DATA:  Preop chest for suspected left hip fracture from fall.  EXAM: CHEST - 1 VIEW  COMPARISON:  12/08/2013  FINDINGS: Borderline heart size with normal pulmonary vascularity. No focal airspace disease or consolidation in the lungs. No blunting of costophrenic angles.  No pneumothorax. Calcified and tortuous aorta. Surgical clips in the left axilla. Interval removal of previous central venous catheter.  IMPRESSION: Borderline heart size.  No evidence of active pulmonary disease.   Electronically Signed   By: Lucienne Capers M.D.   On: 02/24/2014 02:06   Dg Hip Complete Left  02/24/2014   CLINICAL DATA:  Gold[place] from standing.  Left-sided hip pain.  EXAM: LEFT HIP - COMPLETE  2+ VIEW  COMPARISON:  None.  FINDINGS: Comminuted inter trochanteric and subtrochanteric fractures of the left proximal femur with varus angulation of the fracture fragments. Displaced lesser trochanteric fragment. No dislocation at the hip joint. Diffuse bone demineralization. Irregularity of the left superior and inferior pubic rami are nonspecific and may represent old or acute fractures.  IMPRESSION: Acute comminuted fracture of the inter trochanteric and subtrochanteric left hip with varus angulation. Age indeterminate fractures of the left pubic rami.   Electronically Signed   By: Lucienne Capers M.D.   On: 02/24/2014 01:26   Dg Hip Operative Left  02/24/2014   CLINICAL DATA:  Operative images following left proximal femur fracture ORIF.  EXAM: OPERATIVE LEFT HIP  COMPARISON:  02/24/2014 at 0059 hr  FINDINGS: Submitted images show placement of a long intra medullary rod through the left femur supporting a compression screw reducing the intertrochanteric major fracture fragments into near anatomic alignment. There is no acute fracture or evidence of an operative complication.  IMPRESSION: Operative imaging during left proximal femur fracture ORIF.   Electronically Signed   By: Lajean Manes M.D.   On: 02/24/2014 14:45    Scheduled Meds: . sodium chloride   Intravenous Once  . amiodarone  200 mg Oral Daily  . amLODipine  5 mg Oral Daily  . atorvastatin  10 mg Oral Daily  . docusate sodium  100 mg Oral BID  . ferrous sulfate  325 mg Oral Q breakfast  . hydrochlorothiazide  12.5 mg Oral Daily  . insulin aspart  0-9 Units Subcutaneous 6 times per day  . levothyroxine  50 mcg Oral QAC breakfast  . lisinopril  20 mg Oral Daily  . metoprolol  100 mg Oral BID  . pneumococcal 23 valent vaccine  0.5 mL Intramuscular Tomorrow-1000  . rivaroxaban  20 mg Oral Daily   Continuous Infusions: . sodium chloride 75 mL/hr at 02/25/14 0500     Time spent: > 35 minutes    Velvet Bathe  [facility] Pager [phone]. If 7PM-7AM, please contact night-coverage at [email or web address], password [facility] 02/25/2014, 12:59 PM  LOS: 2 days

## 2014-02-26 ENCOUNTER — Telehealth: Payer: Self-pay | Admitting: Family Medicine

## 2014-02-26 ENCOUNTER — Encounter (HOSPITAL_COMMUNITY): Payer: Self-pay | Admitting: Orthopedic Surgery

## 2014-02-26 DIAGNOSIS — S7223XA Displaced subtrochanteric fracture of unspecified femur, initial encounter for closed fracture: Secondary | ICD-10-CM | POA: Diagnosis not present

## 2014-02-26 LAB — BASIC METABOLIC PANEL
Anion gap: 13 (ref 5–15)
BUN: 34 mg/dL — ABNORMAL HIGH (ref 6–23)
CO2: 26 mEq/L (ref 19–32)
Calcium: 8.6 mg/dL (ref 8.4–10.5)
Chloride: 100 mEq/L (ref 96–112)
Creatinine, Ser: 1.41 mg/dL — ABNORMAL HIGH (ref 0.50–1.10)
GFR calc Af Amer: 38 mL/min — ABNORMAL LOW (ref >90)
GFR calc non Af Amer: 33 mL/min — ABNORMAL LOW (ref >90)
Glucose, Bld: 172 mg/dL — ABNORMAL HIGH (ref 70–99)
Potassium: 3.5 mEq/L — ABNORMAL LOW (ref 3.7–5.3)
Sodium: 139 mEq/L (ref 137–147)

## 2014-02-26 LAB — CBC
HCT: 27 % — ABNORMAL LOW (ref 36.0–46.0)
Hemoglobin: 8.9 g/dL — ABNORMAL LOW (ref 12.0–15.0)
MCH: 28.3 pg (ref 26.0–34.0)
MCHC: 33 g/dL (ref 30.0–36.0)
MCV: 86 fL (ref 78.0–100.0)
Platelets: 165 10*3/uL (ref 150–400)
RBC: 3.14 MIL/uL — ABNORMAL LOW (ref 3.87–5.11)
RDW: 15.7 % — ABNORMAL HIGH (ref 11.5–15.5)
WBC: 6.4 10*3/uL (ref 4.0–10.5)

## 2014-02-26 LAB — TYPE AND SCREEN
ABO/RH(D): O POS
Antibody Screen: NEGATIVE
Unit division: 0
Unit division: 0

## 2014-02-26 LAB — GLUCOSE, CAPILLARY
Glucose-Capillary: 184 mg/dL — ABNORMAL HIGH (ref 70–99)
Glucose-Capillary: 161 mg/dL — ABNORMAL HIGH (ref 70–99)
Glucose-Capillary: 174 mg/dL — ABNORMAL HIGH (ref 70–99)
Glucose-Capillary: 209 mg/dL — ABNORMAL HIGH (ref 70–99)

## 2014-02-26 MED ORDER — POTASSIUM CHLORIDE CRYS ER 20 MEQ PO TBCR: 20.0000 meq | EXTENDED_RELEASE_TABLET | Freq: Every day | ORAL | Status: DC

## 2014-02-26 MED ORDER — DSS 100 MG PO CAPS: 100.0000 mg | ORAL_CAPSULE | Freq: Two times a day (BID) | ORAL | Status: DC

## 2014-02-26 MED ORDER — FUROSEMIDE 40 MG PO TABS: 40.0000 mg | ORAL_TABLET | Freq: Every day | ORAL | Status: DC

## 2014-02-26 MED ORDER — RIVAROXABAN 15 MG PO TABS: 15.0000 mg | ORAL_TABLET | Freq: Every day | ORAL | Status: DC

## 2014-02-26 NOTE — Discharge Instructions (Signed)

## 2014-02-26 NOTE — Discharge Summary (Addendum)
Physician Discharge Summary  Whitney [name] [id number]:[id number] DOB: 1927/11/14 DOA: 02/23/2014  PCP: Odette Fraction, MD  Admit date: 02/23/2014 Discharge date: 02/26/2014  Time spent: > 35 minutes  Recommendations for Outpatient Follow-up:  1. Held metformin and ACEI on d/c due to elevated S creatinine 2. Reassess S creatinine 3. Will hold Lasix administration for the next few days given elevated S creatinine levels 4. Pt to f/u with ortho per their recommendations after d/c 5. Reassess hgb levels within the next 1 wk 6. Monitor serum glucose levels given   Discharge Diagnoses:  Principal Problem:   Closed left hip fracture Active Problems:   DM2 (diabetes mellitus, type 2)   Essential hypertension, benign   Breast cancer of upper-outer quadrant of right female breast   Chronic diastolic CHF (congestive heart failure), NYHA class 2   PAF (paroxysmal atrial fibrillation)   Anticoagulated   Acute blood loss anemia   Discharge Condition: stable  Diet recommendation: Carb modified diet  Filed Weights   02/23/14 2327 02/24/14 0552  Weight: 87.091 kg (192 lb) 84.097 kg (185 lb 6.4 oz)    History of present illness:  From original HPI: Whitney [name] is a 80 y.o. female who presents to the ED after a mechanical fall that occurred just PTA. She went to turn her stove off and lost balance resulting in fall (no LOC, no head trauma). After the fall she had L hip pain and deformity, worse with movement, better with remaining still. This has been constant since the fall.  Left hip fracture found in ED.  Hospital Course:  Principal Problem:  Closed left hip fracture - Orthopedic surgery recommended the following:  Plan: Continue Xarelto Touchdown weightbearing on the left. Norco 5 mg as needed for pain. Up with therapy Discharge to SNF when medically stable.  - Physical therapy evaluation  Active Problems:  DM2 (diabetes mellitus, type 2) - Continue diabetic  diet -Hold Metformin on d/c due to elevated S creatinine   Essential hypertension, benign - Patient is currently on amlodipine and metoprolol - will hold hctz and ace I given rise in S creatinine    Breast cancer of upper-outer quadrant of right female breast - Patient follow-up with oncologist or PCP physician for further coordinated care as outpatient   Chronic diastolic CHF (congestive heart failure), NYHA class 2 - Stable currently, continue B blocker - Hold lasix for a few days given elevated S creatinine levels and then continue   PAF (paroxysmal atrial fibrillation) - On amiodarone and metoprolol - Anticoagulated on xarelto at lower dose given creatinine clearance  Hypokalemia -improved from last check - continue oral K replacement regimen  Procedures: 2 Days Post-Op Procedure(s) (LRB): IM ROD LEFT HIP FX (Left)   Consultations:  Orthopaedic surgeron  Discharge Exam: Filed Vitals:   02/26/14 0502  BP: 140/54  Pulse: 69  Temp: 98.3 F (36.8 C)  Resp: 16    General: Pt in nad, alert and awake Cardiovascular: s1 and s2 WNL, no rubs Respiratory: cta bl, no wheezes  Discharge Instructions You were cared for by a hospitalist during your hospital stay. If you have any questions about your discharge medications or the care you received while you were in the hospital after you are discharged, you can call the unit and asked to speak with the hospitalist on call if the hospitalist that took care of you is not available. Once you are discharged, your primary care physician will handle any further medical issues. Please note  that NO REFILLS for any discharge medications will be authorized once you are discharged, as it is imperative that you return to your primary care physician (or establish a relationship with a primary care physician if you do not have one) for your aftercare needs so that they can reassess your need for medications and monitor your lab  values.  Discharge Instructions    Call MD for:  severe uncontrolled pain    Complete by:  As directed      Call MD for:  temperature >100.4    Complete by:  As directed      Diet - low sodium heart healthy    Complete by:  As directed      Discharge instructions    Complete by:  As directed   Please be sure to call you orthopaedic surgeon for specifications regarding follow up.  I have discontinued your lisinopril and Metformin due to your elevated serum creatinine. Discuss with your pcp when to continue these medications.     Increase activity slowly    Complete by:  As directed      Touch down weight bearing    Complete by:  As directed   Laterality:  left  Extremity:  Lower          Current Discharge Medication List    START taking these medications   Details  docusate sodium 100 MG CAPS Take 100 mg by mouth 2 (two) times daily. Qty: 10 capsule, Refills: 0    HYDROcodone-acetaminophen (NORCO) 5-325 MG per tablet Take 1-2 tablets by mouth every 6 (six) hours as needed for moderate pain. Qty: 60 tablet, Refills: 0      CONTINUE these medications which have CHANGED   Details  furosemide (LASIX) 40 MG tablet Take 1 tablet (40 mg total) by mouth daily. Qty: 30 tablet    potassium chloride SA (K-DUR,KLOR-CON) 20 MEQ tablet Take 1 tablet (20 mEq total) by mouth daily.    Rivaroxaban (XARELTO) 15 MG TABS tablet Take 1 tablet (15 mg total) by mouth daily with supper. Qty: 30 tablet      CONTINUE these medications which have NOT CHANGED   Details  amiodarone (PACERONE) 200 MG tablet Take 1 tablet (200 mg total) by mouth daily. Qty: 30 tablet, Refills: 3    amLODipine (NORVASC) 5 MG tablet Take 5 mg by mouth daily. Refills: 5    atorvastatin (LIPITOR) 10 MG tablet Take 10 mg by mouth daily.    Cholecalciferol (VITAMIN D) 2000 UNITS CAPS Take 2,000 Units by mouth daily.    dexamethasone (DECADRON) 4 MG tablet Take 4 mg by mouth 2 (two) times daily.  Refills: 1     ferrous sulfate 325 (65 FE) MG EC tablet Take 325 mg by mouth daily with breakfast.    linagliptin (TRADJENTA) 5 MG TABS tablet Take 5 mg by mouth daily.    metoprolol (LOPRESSOR) 100 MG tablet TAKE 1 TABLET TWICE A DAY Qty: 60 tablet, Refills: 0    pioglitazone (ACTOS) 30 MG tablet TAKE 1 TABLET (30 MG TOTAL) BY MOUTH DAILY. Qty: 90 tablet, Refills: 0    Polyethyl Glycol-Propyl Glycol (SYSTANE OP) Place 1 drop into both eyes 3 (three) times daily.    SYNTHROID 50 MCG tablet TAKE 1 TABLET (50 MCG TOTAL) BY MOUTH DAILY BEFORE BREAKFAST. Qty: 90 tablet, Refills: 0    vitamin B-12 (CYANOCOBALAMIN) 1000 MCG tablet Take 1,000 mcg by mouth daily.      STOP taking these medications  lisinopril-hydrochlorothiazide (PRINZIDE,ZESTORETIC) 20-12.5 MG per tablet      metFORMIN (GLUCOPHAGE-XR) 500 MG 24 hr tablet      prochlorperazine (COMPAZINE) 10 MG tablet        No Known Allergies Follow-up Information    Follow up with GRAVES,JOHN L, MD. Schedule an appointment as soon as possible for a visit in 2 weeks.   Specialty:  Orthopedic Surgery   Contact information:   [place] [address] [phone]        The results of significant diagnostics from this hospitalization (including imaging, microbiology, ancillary and laboratory) are listed below for reference.    Significant Diagnostic Studies: Dg Chest 1 View  02/24/2014   CLINICAL DATA:  Preop chest for suspected left hip fracture from fall.  EXAM: CHEST - 1 VIEW  COMPARISON:  12/08/2013  FINDINGS: Borderline heart size with normal pulmonary vascularity. No focal airspace disease or consolidation in the lungs. No blunting of costophrenic angles. No pneumothorax. Calcified and tortuous aorta. Surgical clips in the left axilla. Interval removal of previous central venous catheter.  IMPRESSION: Borderline heart size.  No evidence of active pulmonary disease.   Electronically Signed   By: Lucienne Capers M.D.   On:  02/24/2014 02:06   Dg Hip Complete Left  02/24/2014   CLINICAL DATA:  Golden Circle from standing.  Left-sided hip pain.  EXAM: LEFT HIP - COMPLETE 2+ VIEW  COMPARISON:  None.  FINDINGS: Comminuted inter trochanteric and subtrochanteric fractures of the left proximal femur with varus angulation of the fracture fragments. Displaced lesser trochanteric fragment. No dislocation at the hip joint. Diffuse bone demineralization. Irregularity of the left superior and inferior pubic rami are nonspecific and may represent old or acute fractures.  IMPRESSION: Acute comminuted fracture of the inter trochanteric and subtrochanteric left hip with varus angulation. Age indeterminate fractures of the left pubic rami.   Electronically Signed   By: Lucienne Capers M.D.   On: 02/24/2014 01:26   Dg Hip Operative Left  02/24/2014   CLINICAL DATA:  Operative images following left proximal femur fracture ORIF.  EXAM: OPERATIVE LEFT HIP  COMPARISON:  02/24/2014 at 0059 hr  FINDINGS: Submitted images show placement of a long intra medullary rod through the left femur supporting a compression screw reducing the intertrochanteric major fracture fragments into near anatomic alignment. There is no acute fracture or evidence of an operative complication.  IMPRESSION: Operative imaging during left proximal femur fracture ORIF.   Electronically Signed   By: Lajean Manes M.D.   On: 02/24/2014 14:45    Microbiology: No results found for this or any previous visit (from the past 240 hour(s)).   Labs: Basic Metabolic Panel:  Recent Labs Lab 02/24/14 0042 02/24/14 1005 02/25/14 0438 02/26/14 0615  NA 142 144 139 139  K 3.0* 3.0* 3.1* 3.5*  CL 98 101 98 100  CO2 27 30 28 26   GLUCOSE 176* 154* 153* 172*  BUN 28* 22 21 34*  CREATININE 1.12* 0.86 1.07 1.41*  CALCIUM 10.1 9.4 8.7 8.6   Liver Function Tests: No results for input(s): AST, ALT, ALKPHOS, BILITOT, PROT, ALBUMIN in the last 168 hours. No results for input(s): LIPASE,  AMYLASE in the last 168 hours. No results for input(s): AMMONIA in the last 168 hours. CBC:  Recent Labs Lab 02/24/14 0042 02/25/14 0438 02/26/14 0615  WBC 6.9 5.4 6.4  NEUTROABS 5.9  --   --   HGB 10.0* 7.5* 8.9*  HCT 31.4* 22.9* 27.0*  MCV  90.0 89.5 86.0  PLT 254 201 165   Cardiac Enzymes: No results for input(s): CKTOTAL, CKMB, CKMBINDEX, TROPONINI in the last 168 hours. BNP: BNP (last 3 results) No results for input(s): PROBNP in the last 8760 hours. CBG:  Recent Labs Lab 02/25/14 1253 02/25/14 1727 02/25/14 2212 02/26/14 0638 02/26/14 1128  GLUCAP 165* 235* 162* 184* 161*     Signed:  Velvet Bathe  [facility] 02/26/2014, 1:17 PM   Chart reviewed and ok for discharge.

## 2014-02-26 NOTE — Progress Notes (Signed)
Physical Therapy Treatment Patient Details Name: Whitney [name] MRN: [id number] DOB: 17-Jun-1927 Today's Date: 2014/03/07    History of Present Illness Admitted post fall resulting in L hip fx; now s/p ORIF with rod    PT Comments    Slow progress with continued pain and weakness making TDWB ambulation difficult.  Will need SNF level rehab at d/c.  Follow Up Recommendations  SNF;Supervision/Assistance - 24 hour     Equipment Recommendations  Wheelchair (measurements PT);Wheelchair cushion (measurements PT);3in1 (PT)    Recommendations for Other Services       Precautions / Restrictions Precautions Precautions: Fall Restrictions Weight Bearing Restrictions: Yes LLE Weight Bearing: Touchdown weight bearing    Mobility  Bed Mobility               General bed mobility comments: pt in chair  Transfers Overall transfer level: Needs assistance Equipment used: Rolling walker (2 wheeled) Transfers: Sit to/from Stand Sit to Stand: +2 physical assistance;Mod assist         General transfer comment: increased time with support needed for balance to transition from arms of chair to walker to achieve full upright standing.  Ambulation/Gait Ambulation/Gait assistance: Mod assist;+2 physical assistance Ambulation Distance (Feet): 5 Feet   Gait Pattern/deviations: Step-to pattern;Shuffle;Trunk flexed     General Gait Details: scooting left LE forward, steps with right with UE support on walker and assist of 2   Stairs            Wheelchair Mobility    Modified Rankin (Stroke Patients Only)       Balance           Standing balance support: Bilateral upper extremity supported Standing balance-Leahy Scale: Poor                      Cognition Arousal/Alertness: Awake/alert Behavior During Therapy: WFL for tasks assessed/performed Overall Cognitive Status: Within Functional Limits for tasks assessed                       Exercises General Exercises - Lower Extremity Ankle Circles/Pumps: AROM;Both;5 reps;Seated Heel Slides: AAROM;Left;5 reps;Seated (with foot on floor)    General Comments        Pertinent Vitals/Pain Pain Score: 5  Pain Location: L hip with movement Pain Intervention(s): Monitored during session;Ice applied;Repositioned    Home Living                      Prior Function            PT Goals (current goals can now be found in the care plan section) Progress towards PT goals: Progressing toward goals    Frequency  Min 3X/week    PT Plan Current plan remains appropriate    Co-evaluation             End of Session Equipment Utilized During Treatment: Gait belt Activity Tolerance: Patient limited by pain;Patient limited by fatigue Patient left: in chair;with call bell/phone within reach     Time: 1208-1225 PT Time Calculation (min) (ACUTE ONLY): 17 min  Charges:  $Gait Training: 8-22 mins                    G Codes:      Paxton Kanaan,CYNDI 07-Mar-2014, 1:50 PM Magda Kiel, [place] 03/07/14

## 2014-02-26 NOTE — Progress Notes (Signed)
Inpatient Diabetes Program Recommendations  AACE/ADA: New Consensus Statement on Inpatient Glycemic Control (2013)  Target Ranges:  Prepandial:   less than 140 mg/dL      Peak postprandial:   less than 180 mg/dL (1-2 hours)      Critically ill patients:  140 - 180 mg/dL   Reason for Assessment:  Results for Whitney [name], Whitney [name] (MRN [id number]) as of 02/26/2014 11:58  Ref. Range 02/25/2014 12:53 02/25/2014 17:27 02/25/2014 22:12 02/26/2014 06:38 02/26/2014 11:28  Glucose-Capillary Latest Range: 70-99 mg/dL 165 (H) 235 (H) 162 (H) 184 (H) 161 (H)    Diabetes history: Type 2 diabetes Outpatient Diabetes medications: Actos 30 mg daily, Metformin XR 500 mg AM and 1000 mg q PM, Tradjenta 5 mg daily  Current orders for Inpatient glycemic control:  Novolog sensitive q 4 hours  Consider changing Novolog correction to moderate tid with meals.  May also consider adding Lantus 10 units daily.  Thanks, Adah Perl, RN, BC-ADM Inpatient Diabetes Coordinator Pager [phone]

## 2014-02-26 NOTE — Progress Notes (Signed)
Subjective: 2 Days Post-Op Procedure(s) (LRB): IM ROD LEFT HIP FX (Left) Patient reports pain as moderate.  Taking by mouth/voiding okay.  She feels better since blood transfusion yesterday.  Objective: Vital signs in last 24 hours: Temp:  [98 F (36.7 C)-98.4 F (36.9 C)] 98.3 F (36.8 C) (12/14 0502) Pulse Rate:  [58-69] 69 (12/14 0502) Resp:  [16-18] 16 (12/14 0502) BP: (92-140)/(38-54) 140/54 mmHg (12/14 0502) SpO2:  [95 %-100 %] 99 % (12/14 0502)  Intake/Output from previous day: 12/13 0701 - 12/14 0700 In: 1265 [P.O.:780; I.V.:150; Blood:335] Out: 450 [Urine:450] Intake/Output this shift:     Recent Labs  02/24/14 0042 02/25/14 0438 02/26/14 0615  HGB 10.0* 7.5* 8.9*    Recent Labs  02/25/14 0438 02/26/14 0615  WBC 5.4 6.4  RBC 2.56* 3.14*  HCT 22.9* 27.0*  PLT 201 165    Recent Labs  02/25/14 0438 02/26/14 0615  NA 139 139  K 3.1* 3.5*  CL 98 100  CO2 28 26  BUN 21 34*  CREATININE 1.07 1.41*  GLUCOSE 153* 172*  CALCIUM 8.7 8.6    Recent Labs  02/24/14 1025 02/25/14 0438  INR 1.84* 1.50*   Left hip exam: Neurovascular intact Sensation intact distally Intact pulses distally Dorsiflexion/Plantar flexion intact Incision: dressing C/D/I Compartment soft  Assessment/Plan: 2 Days Post-Op Procedure(s) (LRB): IM ROD LEFT HIP FX (Left)  Acute blood loss anemia, improving. Plan:  Continue Xarelto Touchdown weightbearing on the left. Norco 5 mg as needed for pain. Up with therapy Discharge to SNF when medically stable.  Melville Engen G 02/26/2014, 10:41 AM

## 2014-02-26 NOTE — Telephone Encounter (Signed)
[id number]

## 2014-02-26 NOTE — Progress Notes (Signed)
Utilization review completed.  

## 2014-02-26 NOTE — Evaluation (Signed)
Occupational Therapy Evaluation and Discharge Patient Details Name: DENNY LAVE MRN: [id number] DOB: 1927/08/10 Today's Date: 02/26/2014    History of Present Illness Admitted post fall resulting in L hip fx; now s/p ORIF with rod   Clinical Impression   This 80 yo female admitted and underwent above presents to acute OT with decreased balance, decreased mobility, increased pain, and obesity all affecting her ability to care for herself at an independent level as she was pta. She will benefit from continued OT at [facility]. Acute OT will sign off.    Follow Up Recommendations  SNF    Equipment Recommendations   (TBD at next venue)       Precautions / Restrictions Precautions Precautions: Fall Restrictions Weight Bearing Restrictions: Yes LLE Weight Bearing: Touchdown weight bearing      Mobility Bed Mobility               General bed mobility comments: Pt up in on [facility] upon arrival  Transfers Overall transfer level: Needs assistance Equipment used: Rolling walker (2 wheeled) Transfers: Sit to/from [facility] Sit to Stand: +2 physical assistance;Mod assist Stand pivot transfers: +2 physical assistance;Mod assist       General transfer comment: Decreased ablity to maintain TTWB    Balance Overall balance assessment: Needs assistance Sitting-balance support: No upper extremity supported;Feet supported Sitting balance-Leahy Scale: Fair     Standing balance support: Bilateral upper extremity supported Standing balance-Leahy Scale: Poor                              ADL Overall ADL's : Needs assistance/impaired Eating/Feeding: Set up;Sitting   Grooming: Set up;Sitting   Upper Body Bathing: Set up;Sitting   Lower Body Bathing: Maximal assistance (with +2 mod A sit<>stand)   Upper Body Dressing : Set up;Sitting   Lower Body Dressing: Total assistance (with +2 mod A sit<>stand)   Toilet Transfer: Moderate assistance;+2 for  physical assistance;Stand-pivot;RW;BSC   Toileting- Clothing Manipulation and Hygiene: Moderate assistance (with +2 mod A sit<>stand)                                  Extremity/Trunk Assessment Upper Extremity Assessment Upper Extremity Assessment: Generalized weakness (for being able to maintain TTWB)              Cognition Arousal/Alertness: Awake/alert Behavior During Therapy: WFL for tasks assessed/performed Overall Cognitive Status: Within Functional Limits for tasks assessed                                Home Living Family/patient expects to be discharged to:: Skilled nursing facility                                             OT Diagnosis: Generalized weakness;Acute pain   OT Problem List: Decreased strength;Decreased range of motion;Decreased activity tolerance;Impaired balance (sitting and/or standing);Pain;Decreased knowledge of precautions;Obesity;Decreased knowledge of use of DME or AE      OT Goals(Current goals can be found in the care plan section) Acute Rehab OT Goals Patient Stated Goal: to rehab then home  OT Frequency:  End of Session Equipment Utilized During Treatment: Gait belt;Rolling walker  Activity Tolerance: Patient limited by fatigue Patient left: in chair;with call bell/phone within reach   Time: 1005-1017 OT Time Calculation (min): 12 min Charges:  OT General Charges $OT Visit: 1 Procedure OT Evaluation $Initial OT Evaluation Tier I: 1 Procedure OT Treatments $Self Care/Home Management : 8-22 mins  Almon Register [phone] 02/26/2014, 4:44 PM

## 2014-02-26 NOTE — Clinical Social Work Psychosocial (Signed)
Clinical Social Work Department BRIEF PSYCHOSOCIAL ASSESSMENT 02/26/2014  Patient:  Whitney [name], Whitney [name]     Account Number:  [id number]     [place] date:  02/23/2014  Clinical Social Worker:  Wylene Men  Date/Time:  02/26/2014 01:55 PM  Referred by:  Physician  Date Referred:  02/26/2014 Referred for  Psychosocial assessment  Other - See comment   Other Referral:   none   Interview type:  Patient Other interview type:   none    PSYCHOSOCIAL DATA Living Status:  FAMILY Admitted from facility:   Level of care:   Primary support name:  [place] Primary support relationship to patient:  FAMILY Degree of support available:   adequate    CURRENT CONCERNS Current Concerns  Post-Acute Placement   Other Concerns:   none    SOCIAL WORK ASSESSMENT / PLAN CSW assessed patient at bedside.  patient was alert and oriented x4 at the time of this assessment.  Patient reports she is from home with granddaughter prior to hospital admission.  PT is recommending SNF/STR at time of dc.  Patient is aware and agreeable.  Patient states she has received STR at SNF in the past, though cannot remember which facility.  Patient gave verbal consent for CSW to send clinical information to SNFs/STRs to begin search for STR placement.  Patient is hopeful and remains optimisitic regarding returning home to independence once STR has been completed.   Assessment/plan status:  Psychosocial Support/Ongoing Assessment of Needs Other assessment/ plan:   FL2  PASARR- should be existing   Information/referral to community resources:   SNF/STR    PATIENT'S/FAMILY'S RESPONSE TO PLAN OF CARE: Pt is agreeable to SNF/STR in [facility] at time of dc.       Nonnie Done, [place] [phone]  Psychiatric & Orthopedics (5N 1-16) Clinical Social Worker

## 2014-02-27 ENCOUNTER — Ambulatory Visit: Payer: Medicare Other | Admitting: Radiation Oncology

## 2014-02-27 LAB — CBC
HCT: 28.4 % — ABNORMAL LOW (ref 36.0–46.0)
Hemoglobin: 9.2 g/dL — ABNORMAL LOW (ref 12.0–15.0)
MCH: 28 pg (ref 26.0–34.0)
MCHC: 32.4 g/dL (ref 30.0–36.0)
MCV: 86.6 fL (ref 78.0–100.0)
Platelets: 197 10*3/uL (ref 150–400)
RBC: 3.28 MIL/uL — ABNORMAL LOW (ref 3.87–5.11)
RDW: 15.6 % — ABNORMAL HIGH (ref 11.5–15.5)
WBC: 6.8 10*3/uL (ref 4.0–10.5)

## 2014-02-27 LAB — GLUCOSE, CAPILLARY
Glucose-Capillary: 146 mg/dL — ABNORMAL HIGH (ref 70–99)
Glucose-Capillary: 152 mg/dL — ABNORMAL HIGH (ref 70–99)
Glucose-Capillary: 190 mg/dL — ABNORMAL HIGH (ref 70–99)

## 2014-02-27 MED ORDER — INSULIN ASPART 100 UNIT/ML ~~LOC~~ SOLN
0.0000 [IU] | Freq: Three times a day (TID) | SUBCUTANEOUS | Status: DC
  Administered 2014-02-27: 2 [IU] via SUBCUTANEOUS
  Administered 2014-02-27: 1 [IU] via SUBCUTANEOUS

## 2014-02-27 NOTE — Progress Notes (Signed)
Subjective: 3 Days Post-Op Procedure(s) (LRB): IM ROD LEFT HIP FX (Left) Patient reports pain as moderate.    Objective: Vital signs in last 24 hours: Temp:  [98.5 F (36.9 C)-99 F (37.2 C)] 98.5 F (36.9 C) (12/15 0451) Pulse Rate:  [62-65] 62 (12/15 0451) Resp:  [18] 18 (12/15 0451) BP: (124-164)/(51-75) 164/75 mmHg (12/15 0451) SpO2:  [98 %-100 %] 100 % (12/15 0451)  Intake/Output from previous day: 12/14 0701 - 12/15 0700 In: 840 [P.O.:840] Out: 150 [Urine:150] Intake/Output this shift: Total I/O In: 240 [P.O.:240] Out: -    Recent Labs  02/25/14 0438 02/26/14 0615 02/27/14 0529  HGB 7.5* 8.9* 9.2*    Recent Labs  02/26/14 0615 02/27/14 0529  WBC 6.4 6.8  RBC 3.14* 3.28*  HCT 27.0* 28.4*  PLT 165 197    Recent Labs  02/25/14 0438 02/26/14 0615  NA 139 139  K 3.1* 3.5*  CL 98 100  CO2 28 26  BUN 21 34*  CREATININE 1.07 1.41*  GLUCOSE 153* 172*  CALCIUM 8.7 8.6    Recent Labs  02/24/14 1025 02/25/14 0438  INR 1.84* 1.50*  left hip exam:  Neurovascular intact Sensation intact distally Intact pulses distally Dorsiflexion/Plantar flexion intact Incision: dressing C/D/I Compartment soft  Assessment/Plan: 3 Days Post-Op Procedure(s) (LRB): IM ROD LEFT HIP FX (Left). Plan: Continue Xarelto  Which she was on preoperatively. Norco 5 mg as needed for pain. Touchdown weightbearing on left. .  Followup with Dr. Berenice Primas in 2 weeks Discharge to SNF  [place] 02/27/2014, 10:20 AM

## 2014-02-27 NOTE — Care Management Note (Signed)
CARE MANAGEMENT NOTE 02/27/2014  Patient:  Whitney [name], Whitney [name]   Account Number:  [id number]  Date Initiated:  02/26/2014  Documentation initiated by:  [facility]  Subjective/Objective Assessment:   lt hip fracture, s/p IM nailing     Action/Plan:   PT eval-recommended SNF   Anticipated DC Date:  02/27/2014   Anticipated DC Plan:  SKILLED NURSING FACILITY  In-house referral  Clinical Social Worker      DC Planning Services  CM consult      Choice offered to / List presented to:          [facility] arranged  NA      Status of service:  Completed, signed off Medicare Important Message given?   (If response is "NO", the following Medicare IM given date fields will be blank) Date Medicare IM given:   Medicare IM given by:   Date Additional Medicare IM given:  02/27/2014 Additional Medicare IM given by:  Ricki Miller  Discharge Disposition:  [place]  Per UR Regulation:  Reviewed for med. necessity/level of care/duration of stay  If discussed at [place] of Stay Meetings, dates discussed:    Comments:

## 2014-02-27 NOTE — Clinical Social Work Note (Signed)
Patient to dc today, per MD order to SNF/STR. [facility] RN to call report prior to transport to: [phone] Transportation: [facility]- scheduled for 2pm  CSW reviewed dc plans with patient at bedside.  Patient will inform her daughter of these plans. Patient is agreeable and will complete own admission's paperwork.  [facility] aware and agreeable.  DC summary sent to SNF.  Packet complete.   Nonnie Done, [place] [phone]  Psychiatric & Orthopedics (5N 1-16) Clinical Social Worker

## 2014-02-27 NOTE — Progress Notes (Signed)
Discharge instructions gave to pt and pt stated understanding, pt is ready to do to [facility]. Report gave to RN of pt in this facility.

## 2014-02-27 NOTE — Clinical Social Work Placement (Signed)
Clinical Social Work Department CLINICAL SOCIAL WORK PLACEMENT NOTE 02/27/2014  Patient:  Whitney [name], Whitney [name]  Account Number:  [id number] [place] date:  02/23/2014  Clinical Social Worker:  Wylene Men  Date/time:  02/27/2014 12:16 PM  Clinical Social Work is seeking post-discharge placement for this patient at the following level of care:   SKILLED NURSING   (*CSW will update this form in Epic as items are completed)   02/26/2014  Patient/family provided with [place] Department of Clinical Social Work's list of facilities offering this level of care within the geographic area requested by the patient (or if unable, by the patient's family).  02/26/2014  Patient/family informed of their freedom to choose among providers that offer the needed level of care, that participate in Medicare, Medicaid or managed care program needed by the patient, have an available bed and are willing to accept the patient.  02/26/2014  Patient/family informed of [facility]' ownership interest in [facility], as well as of the fact that they are under no obligation to receive care at this facility.  PASARR submitted to EDS on 02/26/2014 PASARR number received on 02/26/2014  FL2 transmitted to all facilities in geographic area requested by pt/family on  02/26/2014 FL2 transmitted to all facilities within larger geographic area on   Patient informed that his/her managed care company has contracts with or will negotiate with  certain facilities, including the following:     Patient/family informed of bed offers received:  02/27/2014 Patient chooses bed at [place] Physician recommends and patient chooses bed at    Patient to be transferred to [place] on  02/27/2014 Patient to be transferred to facility by [facility] Patient and family notified of transfer on 02/27/2014 Name of family member notified:  Daughter Thayer Headings and patient  The  following physician request were entered in Epic:   Additional Comments:   Nonnie Done, [place] [phone]  Psychiatric & Orthopedics (5N 1-16) Clinical Social Worker

## 2014-02-28 ENCOUNTER — Telehealth: Payer: Self-pay

## 2014-02-28 NOTE — Telephone Encounter (Signed)
Spoke with patient to inform of change is ct sim appointment to January.She is an inpatient at [facility] and asked that we  Send appointment to her home address so that her daughter can keep up with her appointments.calendar sent on 02/28/15 by Romie Jumper.

## 2014-03-01 ENCOUNTER — Ambulatory Visit: Payer: Medicare Other | Admitting: Cardiology

## 2014-03-02 ENCOUNTER — Ambulatory Visit: Payer: Medicare Other | Admitting: Cardiology

## 2014-03-16 DIAGNOSIS — I5032 Chronic diastolic (congestive) heart failure: Secondary | ICD-10-CM | POA: Diagnosis not present

## 2014-03-16 DIAGNOSIS — S72355D Nondisplaced comminuted fracture of shaft of left femur, subsequent encounter for closed fracture with routine healing: Secondary | ICD-10-CM | POA: Diagnosis not present

## 2014-03-16 DIAGNOSIS — M6281 Muscle weakness (generalized): Secondary | ICD-10-CM | POA: Diagnosis not present

## 2014-03-16 DIAGNOSIS — R2681 Unsteadiness on feet: Secondary | ICD-10-CM | POA: Diagnosis not present

## 2014-03-16 DIAGNOSIS — C50411 Malignant neoplasm of upper-outer quadrant of right female breast: Secondary | ICD-10-CM | POA: Diagnosis not present

## 2014-03-16 DIAGNOSIS — S2220XA Unspecified fracture of sternum, initial encounter for closed fracture: Secondary | ICD-10-CM | POA: Diagnosis not present

## 2014-03-16 DIAGNOSIS — Z9889 Other specified postprocedural states: Secondary | ICD-10-CM | POA: Diagnosis not present

## 2014-03-16 DIAGNOSIS — R296 Repeated falls: Secondary | ICD-10-CM | POA: Diagnosis not present

## 2014-03-16 DIAGNOSIS — E119 Type 2 diabetes mellitus without complications: Secondary | ICD-10-CM | POA: Diagnosis not present

## 2014-03-16 DIAGNOSIS — I480 Paroxysmal atrial fibrillation: Secondary | ICD-10-CM | POA: Diagnosis not present

## 2014-03-16 DIAGNOSIS — R269 Unspecified abnormalities of gait and mobility: Secondary | ICD-10-CM | POA: Diagnosis not present

## 2014-03-16 DIAGNOSIS — S8253XA Displaced fracture of medial malleolus of unspecified tibia, initial encounter for closed fracture: Secondary | ICD-10-CM | POA: Diagnosis not present

## 2014-03-19 DIAGNOSIS — Z9889 Other specified postprocedural states: Secondary | ICD-10-CM | POA: Diagnosis not present

## 2014-03-21 ENCOUNTER — Encounter: Payer: Self-pay | Admitting: Cardiology

## 2014-03-22 ENCOUNTER — Telehealth: Payer: Self-pay | Admitting: *Deleted

## 2014-03-22 DIAGNOSIS — I509 Heart failure, unspecified: Secondary | ICD-10-CM | POA: Diagnosis not present

## 2014-03-22 DIAGNOSIS — Z9181 History of falling: Secondary | ICD-10-CM | POA: Diagnosis not present

## 2014-03-22 DIAGNOSIS — I4891 Unspecified atrial fibrillation: Secondary | ICD-10-CM | POA: Diagnosis not present

## 2014-03-22 DIAGNOSIS — E119 Type 2 diabetes mellitus without complications: Secondary | ICD-10-CM | POA: Diagnosis not present

## 2014-03-22 DIAGNOSIS — D62 Acute posthemorrhagic anemia: Secondary | ICD-10-CM | POA: Diagnosis not present

## 2014-03-22 DIAGNOSIS — S72002D Fracture of unspecified part of neck of left femur, subsequent encounter for closed fracture with routine healing: Secondary | ICD-10-CM | POA: Diagnosis not present

## 2014-03-22 DIAGNOSIS — E785 Hyperlipidemia, unspecified: Secondary | ICD-10-CM | POA: Diagnosis not present

## 2014-03-22 MED ORDER — POTASSIUM CHLORIDE 20 MEQ PO PACK: 20.0000 meq | PACK | Freq: Once | ORAL | Status: DC

## 2014-03-22 NOTE — Telephone Encounter (Signed)
Received call from Jerelyn Scott with [facility] [phone].  Reports that patient has been released from [facility] for L hip fracture. States that patient has new Dx of heart failure.   Reports that patient has 2-3+ edema to BLE from the knees down. States that patient has no respiratory distress. VS WNL.   States that patient is currently taking Lasix 40mg  PO QD. Requested to have MD advise.

## 2014-03-22 NOTE — Telephone Encounter (Signed)
Increase lasix 80 qday and add Kdur 20 poqday and recheck here Monday.

## 2014-03-22 NOTE — Telephone Encounter (Signed)
Call placed to patient and patient made aware.   Appointment scheduled.   Returned call to Jerelyn Scott and Verdis Frederickson made aware.

## 2014-03-23 ENCOUNTER — Encounter: Payer: Self-pay | Admitting: *Deleted

## 2014-03-23 ENCOUNTER — Telehealth: Payer: Self-pay | Admitting: Endocrinology

## 2014-03-23 ENCOUNTER — Ambulatory Visit: Payer: Medicare Other | Admitting: Endocrinology

## 2014-03-23 DIAGNOSIS — I509 Heart failure, unspecified: Secondary | ICD-10-CM | POA: Diagnosis not present

## 2014-03-23 DIAGNOSIS — S72002D Fracture of unspecified part of neck of left femur, subsequent encounter for closed fracture with routine healing: Secondary | ICD-10-CM | POA: Diagnosis not present

## 2014-03-23 DIAGNOSIS — D62 Acute posthemorrhagic anemia: Secondary | ICD-10-CM | POA: Diagnosis not present

## 2014-03-23 DIAGNOSIS — Z9181 History of falling: Secondary | ICD-10-CM | POA: Diagnosis not present

## 2014-03-23 DIAGNOSIS — E119 Type 2 diabetes mellitus without complications: Secondary | ICD-10-CM | POA: Diagnosis not present

## 2014-03-23 DIAGNOSIS — E785 Hyperlipidemia, unspecified: Secondary | ICD-10-CM | POA: Diagnosis not present

## 2014-03-23 DIAGNOSIS — I4891 Unspecified atrial fibrillation: Secondary | ICD-10-CM | POA: Diagnosis not present

## 2014-03-23 NOTE — Telephone Encounter (Signed)
Letter Mailed

## 2014-03-23 NOTE — Telephone Encounter (Signed)
Patient no showed today's appt. Please advise on how to follow up. °A. No follow up necessary. °B. Follow up urgent. Contact patient immediately. °C. Follow up necessary. Contact patient and schedule visit in ___ days. °D. Follow up advised. Contact patient and schedule visit in ____weeks. ° °

## 2014-03-24 DIAGNOSIS — E785 Hyperlipidemia, unspecified: Secondary | ICD-10-CM | POA: Diagnosis not present

## 2014-03-24 DIAGNOSIS — D62 Acute posthemorrhagic anemia: Secondary | ICD-10-CM | POA: Diagnosis not present

## 2014-03-24 DIAGNOSIS — E119 Type 2 diabetes mellitus without complications: Secondary | ICD-10-CM | POA: Diagnosis not present

## 2014-03-24 DIAGNOSIS — Z9181 History of falling: Secondary | ICD-10-CM | POA: Diagnosis not present

## 2014-03-24 DIAGNOSIS — I509 Heart failure, unspecified: Secondary | ICD-10-CM | POA: Diagnosis not present

## 2014-03-24 DIAGNOSIS — I4891 Unspecified atrial fibrillation: Secondary | ICD-10-CM | POA: Diagnosis not present

## 2014-03-24 DIAGNOSIS — S72002D Fracture of unspecified part of neck of left femur, subsequent encounter for closed fracture with routine healing: Secondary | ICD-10-CM | POA: Diagnosis not present

## 2014-03-26 ENCOUNTER — Telehealth: Payer: Self-pay | Admitting: Hematology and Oncology

## 2014-03-26 ENCOUNTER — Ambulatory Visit: Payer: Self-pay | Admitting: Family Medicine

## 2014-03-26 DIAGNOSIS — S72002D Fracture of unspecified part of neck of left femur, subsequent encounter for closed fracture with routine healing: Secondary | ICD-10-CM | POA: Diagnosis not present

## 2014-03-26 DIAGNOSIS — E785 Hyperlipidemia, unspecified: Secondary | ICD-10-CM | POA: Diagnosis not present

## 2014-03-26 DIAGNOSIS — Z9181 History of falling: Secondary | ICD-10-CM | POA: Diagnosis not present

## 2014-03-26 DIAGNOSIS — I4891 Unspecified atrial fibrillation: Secondary | ICD-10-CM | POA: Diagnosis not present

## 2014-03-26 DIAGNOSIS — E119 Type 2 diabetes mellitus without complications: Secondary | ICD-10-CM | POA: Diagnosis not present

## 2014-03-26 DIAGNOSIS — I509 Heart failure, unspecified: Secondary | ICD-10-CM | POA: Diagnosis not present

## 2014-03-26 DIAGNOSIS — D62 Acute posthemorrhagic anemia: Secondary | ICD-10-CM | POA: Diagnosis not present

## 2014-03-26 NOTE — Telephone Encounter (Signed)
, °

## 2014-03-27 DIAGNOSIS — I4891 Unspecified atrial fibrillation: Secondary | ICD-10-CM | POA: Diagnosis not present

## 2014-03-27 DIAGNOSIS — D62 Acute posthemorrhagic anemia: Secondary | ICD-10-CM | POA: Diagnosis not present

## 2014-03-27 DIAGNOSIS — S72002D Fracture of unspecified part of neck of left femur, subsequent encounter for closed fracture with routine healing: Secondary | ICD-10-CM | POA: Diagnosis not present

## 2014-03-27 DIAGNOSIS — E785 Hyperlipidemia, unspecified: Secondary | ICD-10-CM | POA: Diagnosis not present

## 2014-03-27 DIAGNOSIS — I509 Heart failure, unspecified: Secondary | ICD-10-CM | POA: Diagnosis not present

## 2014-03-27 DIAGNOSIS — E119 Type 2 diabetes mellitus without complications: Secondary | ICD-10-CM | POA: Diagnosis not present

## 2014-03-27 DIAGNOSIS — Z9181 History of falling: Secondary | ICD-10-CM | POA: Diagnosis not present

## 2014-03-28 ENCOUNTER — Ambulatory Visit: Payer: Medicare Other | Admitting: Hematology and Oncology

## 2014-03-28 DIAGNOSIS — E119 Type 2 diabetes mellitus without complications: Secondary | ICD-10-CM | POA: Diagnosis not present

## 2014-03-28 DIAGNOSIS — S72002D Fracture of unspecified part of neck of left femur, subsequent encounter for closed fracture with routine healing: Secondary | ICD-10-CM | POA: Diagnosis not present

## 2014-03-28 DIAGNOSIS — I509 Heart failure, unspecified: Secondary | ICD-10-CM | POA: Diagnosis not present

## 2014-03-28 DIAGNOSIS — E785 Hyperlipidemia, unspecified: Secondary | ICD-10-CM | POA: Diagnosis not present

## 2014-03-28 DIAGNOSIS — Z9181 History of falling: Secondary | ICD-10-CM | POA: Diagnosis not present

## 2014-03-28 DIAGNOSIS — D62 Acute posthemorrhagic anemia: Secondary | ICD-10-CM | POA: Diagnosis not present

## 2014-03-28 DIAGNOSIS — I4891 Unspecified atrial fibrillation: Secondary | ICD-10-CM | POA: Diagnosis not present

## 2014-03-29 ENCOUNTER — Encounter: Payer: Self-pay | Admitting: Family Medicine

## 2014-03-29 ENCOUNTER — Ambulatory Visit (INDEPENDENT_AMBULATORY_CARE_PROVIDER_SITE_OTHER): Payer: Medicare Other | Admitting: Family Medicine

## 2014-03-29 VITALS — BP 108/64 | HR 58 | Temp 98.2°F | Resp 14

## 2014-03-29 DIAGNOSIS — R600 Localized edema: Secondary | ICD-10-CM

## 2014-03-29 LAB — CBC WITH DIFFERENTIAL/PLATELET
Basophils Absolute: 0 10*3/uL (ref 0.0–0.1)
Basophils Relative: 0 % (ref 0–1)
Eosinophils Absolute: 0.1 10*3/uL (ref 0.0–0.7)
Eosinophils Relative: 1 % (ref 0–5)
HCT: 33.7 % — ABNORMAL LOW (ref 36.0–46.0)
Hemoglobin: 10.6 g/dL — ABNORMAL LOW (ref 12.0–15.0)
Lymphocytes Relative: 13 % (ref 12–46)
Lymphs Abs: 0.7 10*3/uL (ref 0.7–4.0)
MCH: 27.9 pg (ref 26.0–34.0)
MCHC: 31.5 g/dL (ref 30.0–36.0)
MCV: 88.7 fL (ref 78.0–100.0)
MPV: 9.6 fL (ref 8.6–12.4)
Monocytes Absolute: 0.5 10*3/uL (ref 0.1–1.0)
Monocytes Relative: 10 % (ref 3–12)
Neutro Abs: 4 10*3/uL (ref 1.7–7.7)
Neutrophils Relative %: 76 % (ref 43–77)
Platelets: 329 10*3/uL (ref 150–400)
RBC: 3.8 MIL/uL — ABNORMAL LOW (ref 3.87–5.11)
RDW: 16 % — ABNORMAL HIGH (ref 11.5–15.5)
WBC: 5.2 10*3/uL (ref 4.0–10.5)

## 2014-03-29 LAB — COMPLETE METABOLIC PANEL WITH GFR
ALT: 10 U/L (ref 0–35)
AST: 12 U/L (ref 0–37)
Albumin: 3.5 g/dL (ref 3.5–5.2)
Alkaline Phosphatase: 175 U/L — ABNORMAL HIGH (ref 39–117)
BUN: 20 mg/dL (ref 6–23)
CO2: 30 mEq/L (ref 19–32)
Calcium: 9.3 mg/dL (ref 8.4–10.5)
Chloride: 98 mEq/L (ref 96–112)
Creat: 1.21 mg/dL — ABNORMAL HIGH (ref 0.50–1.10)
GFR, Est African American: 47 mL/min — ABNORMAL LOW
GFR, Est Non African American: 41 mL/min — ABNORMAL LOW
Glucose, Bld: 130 mg/dL — ABNORMAL HIGH (ref 70–99)
Potassium: 3.7 mEq/L (ref 3.5–5.3)
Sodium: 140 mEq/L (ref 135–145)
Total Bilirubin: 0.6 mg/dL (ref 0.2–1.2)
Total Protein: 6 g/dL (ref 6.0–8.3)

## 2014-03-29 NOTE — Progress Notes (Signed)
Subjective:    Patient ID: Whitney [name], female    DOB: 1927-11-14, 81 y.o.   MRN: [id number]  HPI  Patient recently suffered a hip fracture and was discharged from the hospital to SNF.  I was called 03/22/14 and the nurse reported +2-3 edema in both legs.  Echo 9/15 revelaed grade 2 diastolic dysfunction but EF of 60-65%.  Patient is on anticoagulation (xarelto) for afib making DVT unlikely.  I increased lasix to 80 poqday and Kdur 20 poqday and instructed her to follow up here asap.  She is here today for eval.  Swelling is much better. There is no swelling in her right leg. There is trace to +1 swelling in her left leg. She is compliant in taking her blood thinner. She is also been taking the Lasix and potassium as directed. She denies any chest pain shortness of breath.  Past Medical History  Diagnosis Date  . Hypertension   . Hyperlipidemia   . Diabetes mellitus without complication   . Thyroid disease     hypothyroidism  . Anemia   . Former smoker   . Multinodular goiter   . Complication of anesthesia     slow to wake up  . Cancer     right breast  . Wears glasses   . Full dentures   . Dysrhythmia 10/15    AF  . Breast cancer 10/2013    right upper outer  . Atrial fibrillation    Past Surgical History  Procedure Laterality Date  . Abdominal hysterectomy    . Portacath placement N/A 11/15/2013    Procedure: INSERTION PORT-A-CATH WITH ULTRA SOUND AND FLOROSCOPY;  Surgeon: [name], MD;  Location: [place];  Service: General;  Laterality: N/A;  . Eye surgery  12/22/2009    cataracts  . Total knee arthroplasty  2002    left  . Port-a-cath removal Right 01/09/2014    Procedure: REMOVAL PORT-A-CATH;  Surgeon: [name], MD;  Location: [place];  Service: General;  Laterality: Right;  . Breast lumpectomy with radioactive seed localization Right 01/09/2014    Procedure: RIGHT BREAST SEED LOCALIZED LUMPECTOMY ;  Surgeon: [name], MD;   Location: [place];  Service: General;  Laterality: Right;  . Axillary lymph node dissection Right 01/09/2014    Procedure: RIGHT AXILLARY LYMPH NODE DISECTION;  Surgeon: [name], MD;  Location: [place];  Service: General;  Laterality: Right;  . Intramedullary (im) nail intertrochanteric Left 02/24/2014    Procedure: IM ROD LEFT HIP FX;  Surgeon: Alta Corning, MD;  Location: [place];  Service: Orthopedics;  Laterality: Left;   Current Outpatient Prescriptions on File Prior to Visit  Medication Sig Dispense Refill  . amiodarone (PACERONE) 200 MG tablet Take 1 tablet (200 mg total) by mouth daily. 30 tablet 3  . amLODipine (NORVASC) 5 MG tablet Take 5 mg by mouth daily.  5  . atorvastatin (LIPITOR) 10 MG tablet Take 10 mg by mouth daily.    . Cholecalciferol (VITAMIN D) 2000 UNITS CAPS Take 2,000 Units by mouth daily.    Marland Kitchen docusate sodium 100 MG CAPS Take 100 mg by mouth 2 (two) times daily. 10 capsule 0  . ferrous sulfate 325 (65 FE) MG EC tablet Take 325 mg by mouth daily with breakfast.    . furosemide (LASIX) 40 MG tablet Take 1 tablet (40 mg total) by mouth daily. 30 tablet   . HYDROcodone-acetaminophen (NORCO) 5-325 MG per  tablet Take 1-2 tablets by mouth every 6 (six) hours as needed for moderate pain. 60 tablet 0  . linagliptin (TRADJENTA) 5 MG TABS tablet Take 5 mg by mouth daily.    . metoprolol (LOPRESSOR) 100 MG tablet TAKE 1 TABLET TWICE A DAY 60 tablet 0  . pioglitazone (ACTOS) 30 MG tablet TAKE 1 TABLET (30 MG TOTAL) BY MOUTH DAILY. 90 tablet 0  . Polyethyl Glycol-Propyl Glycol (SYSTANE OP) Place 1 drop into both eyes 3 (three) times daily.    . potassium chloride (KLOR-CON) 20 MEQ packet Take 20 mEq by mouth once. 30 tablet 0  . potassium chloride SA (K-DUR,KLOR-CON) 20 MEQ tablet Take 1 tablet (20 mEq total) by mouth daily.    . Rivaroxaban (XARELTO) 15 MG TABS tablet Take 1 tablet (15 mg total) by mouth daily with supper. 30 tablet   .  SYNTHROID 50 MCG tablet TAKE 1 TABLET (50 MCG TOTAL) BY MOUTH DAILY BEFORE BREAKFAST. 90 tablet 0  . vitamin B-12 (CYANOCOBALAMIN) 1000 MCG tablet Take 1,000 mcg by mouth daily.     No current facility-administered medications on file prior to visit.   No Known Allergies History   Social History  . Marital Status: Widowed    Spouse Name: N/A    Number of Children: 4  . Years of Education: N/A   Occupational History  . Not on file.   Social History Main Topics  . Smoking status: Former Smoker -- 1.00 packs/day for 5 years    Quit date: 11/14/1958  . Smokeless tobacco: Never Used  . Alcohol Use: No  . Drug Use: No  . Sexual Activity: No     Comment: menarche age 59, P54, first birth age 28, no HRT, menopause age 71   Other Topics Concern  . Not on file   Social History Narrative   No family history on file.   Review of Systems  All other systems reviewed and are negative.      Objective:   Physical Exam  Constitutional: She appears well-developed and well-nourished.  Neck: No JVD present.  Cardiovascular: Normal rate, regular rhythm and normal heart sounds.  Exam reveals no gallop and no friction rub.   No murmur heard. Pulmonary/Chest: Effort normal and breath sounds normal. No respiratory distress. She has no wheezes. She has no rales.  Abdominal: Soft. Bowel sounds are normal.  Musculoskeletal: She exhibits edema.  Vitals reviewed.  patient has no edema in the right leg. She has trace to +1 edema in the left leg to the knee.        Assessment & Plan:  Bilateral leg edema - Plan: CBC with Differential, COMPLETE METABOLIC PANEL WITH GFR  At this point, I believe the edema in her left leg is dependent edema due to her hip fracture. There is no edema in her right leg. Therefore I will decrease her Lasix to 40 mg a day and discontinued her potassium to avoid dehydration. I believe the odds of a DVT are extremely low given the fact the patient is already on  anticoagulation.

## 2014-03-30 DIAGNOSIS — D62 Acute posthemorrhagic anemia: Secondary | ICD-10-CM | POA: Diagnosis not present

## 2014-03-30 DIAGNOSIS — I4891 Unspecified atrial fibrillation: Secondary | ICD-10-CM | POA: Diagnosis not present

## 2014-03-30 DIAGNOSIS — E119 Type 2 diabetes mellitus without complications: Secondary | ICD-10-CM | POA: Diagnosis not present

## 2014-03-30 DIAGNOSIS — S72002D Fracture of unspecified part of neck of left femur, subsequent encounter for closed fracture with routine healing: Secondary | ICD-10-CM | POA: Diagnosis not present

## 2014-03-30 DIAGNOSIS — E785 Hyperlipidemia, unspecified: Secondary | ICD-10-CM | POA: Diagnosis not present

## 2014-03-30 DIAGNOSIS — Z9181 History of falling: Secondary | ICD-10-CM | POA: Diagnosis not present

## 2014-03-30 DIAGNOSIS — I509 Heart failure, unspecified: Secondary | ICD-10-CM | POA: Diagnosis not present

## 2014-04-02 DIAGNOSIS — D62 Acute posthemorrhagic anemia: Secondary | ICD-10-CM | POA: Diagnosis not present

## 2014-04-02 DIAGNOSIS — Z9181 History of falling: Secondary | ICD-10-CM | POA: Diagnosis not present

## 2014-04-02 DIAGNOSIS — I509 Heart failure, unspecified: Secondary | ICD-10-CM | POA: Diagnosis not present

## 2014-04-02 DIAGNOSIS — E119 Type 2 diabetes mellitus without complications: Secondary | ICD-10-CM | POA: Diagnosis not present

## 2014-04-02 DIAGNOSIS — S72002D Fracture of unspecified part of neck of left femur, subsequent encounter for closed fracture with routine healing: Secondary | ICD-10-CM | POA: Diagnosis not present

## 2014-04-02 DIAGNOSIS — E785 Hyperlipidemia, unspecified: Secondary | ICD-10-CM | POA: Diagnosis not present

## 2014-04-02 DIAGNOSIS — I4891 Unspecified atrial fibrillation: Secondary | ICD-10-CM | POA: Diagnosis not present

## 2014-04-03 ENCOUNTER — Encounter: Payer: Self-pay | Admitting: *Deleted

## 2014-04-03 ENCOUNTER — Telehealth: Payer: Self-pay | Admitting: Family Medicine

## 2014-04-03 ENCOUNTER — Ambulatory Visit: Payer: Medicare Other | Admitting: Radiation Oncology

## 2014-04-03 DIAGNOSIS — E119 Type 2 diabetes mellitus without complications: Secondary | ICD-10-CM | POA: Diagnosis not present

## 2014-04-03 DIAGNOSIS — I4891 Unspecified atrial fibrillation: Secondary | ICD-10-CM | POA: Diagnosis not present

## 2014-04-03 DIAGNOSIS — E785 Hyperlipidemia, unspecified: Secondary | ICD-10-CM | POA: Diagnosis not present

## 2014-04-03 DIAGNOSIS — Z9181 History of falling: Secondary | ICD-10-CM | POA: Diagnosis not present

## 2014-04-03 DIAGNOSIS — S72002D Fracture of unspecified part of neck of left femur, subsequent encounter for closed fracture with routine healing: Secondary | ICD-10-CM | POA: Diagnosis not present

## 2014-04-03 DIAGNOSIS — I509 Heart failure, unspecified: Secondary | ICD-10-CM | POA: Diagnosis not present

## 2014-04-03 DIAGNOSIS — D62 Acute posthemorrhagic anemia: Secondary | ICD-10-CM | POA: Diagnosis not present

## 2014-04-03 NOTE — Telephone Encounter (Signed)
Pt daughter called to ask about new medication from rehab.  Has been put on Eliquis now.  No longer on Xarelto.  Wanted to know about dosing.  Told Eliquis is twice a day.  Medication list adjusted to show change in blood thinner.

## 2014-04-04 DIAGNOSIS — D62 Acute posthemorrhagic anemia: Secondary | ICD-10-CM | POA: Diagnosis not present

## 2014-04-04 DIAGNOSIS — I509 Heart failure, unspecified: Secondary | ICD-10-CM | POA: Diagnosis not present

## 2014-04-04 DIAGNOSIS — Z9181 History of falling: Secondary | ICD-10-CM | POA: Diagnosis not present

## 2014-04-04 DIAGNOSIS — I4891 Unspecified atrial fibrillation: Secondary | ICD-10-CM | POA: Diagnosis not present

## 2014-04-04 DIAGNOSIS — S72002D Fracture of unspecified part of neck of left femur, subsequent encounter for closed fracture with routine healing: Secondary | ICD-10-CM | POA: Diagnosis not present

## 2014-04-04 DIAGNOSIS — E119 Type 2 diabetes mellitus without complications: Secondary | ICD-10-CM | POA: Diagnosis not present

## 2014-04-04 DIAGNOSIS — E785 Hyperlipidemia, unspecified: Secondary | ICD-10-CM | POA: Diagnosis not present

## 2014-04-05 DIAGNOSIS — I4891 Unspecified atrial fibrillation: Secondary | ICD-10-CM | POA: Diagnosis not present

## 2014-04-05 DIAGNOSIS — E119 Type 2 diabetes mellitus without complications: Secondary | ICD-10-CM | POA: Diagnosis not present

## 2014-04-05 DIAGNOSIS — S72002D Fracture of unspecified part of neck of left femur, subsequent encounter for closed fracture with routine healing: Secondary | ICD-10-CM | POA: Diagnosis not present

## 2014-04-05 DIAGNOSIS — I509 Heart failure, unspecified: Secondary | ICD-10-CM | POA: Diagnosis not present

## 2014-04-05 DIAGNOSIS — D62 Acute posthemorrhagic anemia: Secondary | ICD-10-CM | POA: Diagnosis not present

## 2014-04-05 DIAGNOSIS — E785 Hyperlipidemia, unspecified: Secondary | ICD-10-CM | POA: Diagnosis not present

## 2014-04-05 DIAGNOSIS — Z9181 History of falling: Secondary | ICD-10-CM | POA: Diagnosis not present

## 2014-04-07 ENCOUNTER — Other Ambulatory Visit: Payer: Self-pay | Admitting: Endocrinology

## 2014-04-16 DIAGNOSIS — Z9889 Other specified postprocedural states: Secondary | ICD-10-CM | POA: Diagnosis not present

## 2014-04-19 DIAGNOSIS — R269 Unspecified abnormalities of gait and mobility: Secondary | ICD-10-CM | POA: Diagnosis not present

## 2014-04-23 ENCOUNTER — Encounter: Payer: Self-pay | Admitting: Family Medicine

## 2014-04-23 ENCOUNTER — Ambulatory Visit (INDEPENDENT_AMBULATORY_CARE_PROVIDER_SITE_OTHER): Payer: Medicare Other | Admitting: Family Medicine

## 2014-04-23 ENCOUNTER — Other Ambulatory Visit (HOSPITAL_COMMUNITY): Payer: Self-pay | Admitting: Family Medicine

## 2014-04-23 ENCOUNTER — Other Ambulatory Visit: Payer: Self-pay | Admitting: Family Medicine

## 2014-04-23 VITALS — BP 110/74 | HR 60 | Temp 98.3°F | Resp 18 | Wt 184.0 lb

## 2014-04-23 DIAGNOSIS — R609 Edema, unspecified: Secondary | ICD-10-CM

## 2014-04-23 DIAGNOSIS — R600 Localized edema: Secondary | ICD-10-CM

## 2014-04-23 DIAGNOSIS — I890 Lymphedema, not elsewhere classified: Secondary | ICD-10-CM

## 2014-04-23 NOTE — Progress Notes (Signed)
Subjective:    Patient ID: Whitney [name], female    DOB: [birth year], 81 y.o.   MRN: [id number]  HPI  Patient has a history of breast cancer in her right breast and did have excisional biopsies of 5 lymph nodes from her right breast. Over the last week, she has had marked swelling in her right arm. She has +2 pitting edema distal to her right elbow. She has not had any kind of vascular intervention in her right arm in more than 2 weeks. She denies any indwelling IV catheters etc. She denies any trauma to the arm. There is no pain in the arm. There is no swelling in the left arm. She is on chronic anticoagulation for atrial fibrillation making a blood clot unlikely. Past Medical History  Diagnosis Date  . Hypertension   . Hyperlipidemia   . Diabetes mellitus without complication   . Thyroid disease     hypothyroidism  . Anemia   . Former smoker   . Multinodular goiter   . Complication of anesthesia     slow to wake up  . Cancer     right breast  . Wears glasses   . Full dentures   . Dysrhythmia 10/15    AF  . Breast cancer 10/2013    right upper outer  . Atrial fibrillation    Past Surgical History  Procedure Laterality Date  . Abdominal hysterectomy    . Portacath placement N/A 11/15/2013    Procedure: INSERTION PORT-A-CATH WITH ULTRA SOUND AND FLOROSCOPY;  Surgeon: [name], MD;  Location: [place];  Service: General;  Laterality: N/A;  . Eye surgery  12/22/2009    cataracts  . Total knee arthroplasty  2002    left  . Port-a-cath removal Right 01/09/2014    Procedure: REMOVAL PORT-A-CATH;  Surgeon: [name], MD;  Location: [place];  Service: General;  Laterality: Right;  . Breast lumpectomy with radioactive seed localization Right 01/09/2014    Procedure: RIGHT BREAST SEED LOCALIZED LUMPECTOMY ;  Surgeon: [name], MD;  Location: [place];  Service: General;  Laterality: Right;  . Axillary lymph node dissection Right  01/09/2014    Procedure: RIGHT AXILLARY LYMPH NODE DISECTION;  Surgeon: [name], MD;  Location: [facility];  Service: General;  Laterality: Right;  . Intramedullary (im) nail intertrochanteric Left 02/24/2014    Procedure: IM ROD LEFT HIP FX;  Surgeon: Alta Corning, MD;  Location: [place];  Service: Orthopedics;  Laterality: Left;   Current Outpatient Prescriptions on File Prior to Visit  Medication Sig Dispense Refill  . amiodarone (PACERONE) 200 MG tablet Take 1 tablet (200 mg total) by mouth daily. 30 tablet 3  . amLODipine (NORVASC) 5 MG tablet Take 5 mg by mouth daily.  5  . atorvastatin (LIPITOR) 10 MG tablet Take 10 mg by mouth daily.    . Cholecalciferol (VITAMIN D) 2000 UNITS CAPS Take 2,000 Units by mouth daily.    Marland Kitchen docusate sodium 100 MG CAPS Take 100 mg by mouth 2 (two) times daily. 10 capsule 0  . ELIQUIS 5 MG TABS tablet Take 5 mg by mouth 2 (two) times daily.  2  . ferrous sulfate 325 (65 FE) MG EC tablet Take 325 mg by mouth daily with breakfast.    . furosemide (LASIX) 40 MG tablet Take 1 tablet (40 mg total) by mouth daily. 30 tablet   . HYDROcodone-acetaminophen (NORCO) 5-325 MG per tablet Take 1-2  tablets by mouth every 6 (six) hours as needed for moderate pain. 60 tablet 0  . linagliptin (TRADJENTA) 5 MG TABS tablet Take 5 mg by mouth daily.    . metoprolol (LOPRESSOR) 100 MG tablet TAKE 1 TABLET BY MOUTH TWICE A DAY 60 tablet 3  . pioglitazone (ACTOS) 30 MG tablet TAKE 1 TABLET (30 MG TOTAL) BY MOUTH DAILY. 90 tablet 0  . Polyethyl Glycol-Propyl Glycol (SYSTANE OP) Place 1 drop into both eyes 3 (three) times daily.    . potassium chloride (KLOR-CON) 20 MEQ packet Take 20 mEq by mouth once. 30 tablet 0  . SYNTHROID 50 MCG tablet TAKE 1 TABLET (50 MCG TOTAL) BY MOUTH DAILY BEFORE BREAKFAST. 90 tablet 0  . vitamin B-12 (CYANOCOBALAMIN) 1000 MCG tablet Take 1,000 mcg by mouth daily.     No current facility-administered medications on file prior to  visit.   No Known Allergies History   Social History  . Marital Status: Widowed    Spouse Name: N/A    Number of Children: 4  . Years of Education: N/A   Occupational History  . Not on file.   Social History Main Topics  . Smoking status: Former Smoker -- 1.00 packs/day for 5 years    Quit date: 11/14/1958  . Smokeless tobacco: Never Used  . Alcohol Use: No  . Drug Use: No  . Sexual Activity: No     Comment: menarche age 41, P77, first birth age 48, no HRT, menopause age 89   Other Topics Concern  . Not on file   Social History Narrative     Review of Systems  All other systems reviewed and are negative.      Objective:   Physical Exam  Cardiovascular: Normal rate and normal heart sounds.   Pulmonary/Chest: Effort normal and breath sounds normal. No respiratory distress. She has no wheezes. She has no rales. She exhibits no tenderness.  Musculoskeletal: She exhibits edema.  Vitals reviewed.         Assessment & Plan:  Edema, peripheral - Plan: US Venous Img Upper Uni Right  Lymphedema - Plan: US Venous Img Upper Uni Right  Most likely this is lymphedema. It is unusual that it is just now arisen in the right arm. I cannot completely exclude a DVT in the right arm and therefore I will order an ultrasound of the right arm to exclude a DVT. If the ultrasound is negative, I will refer the patient to lymphedema clinic for compression stockings for the right arm.

## 2014-04-24 ENCOUNTER — Encounter (HOSPITAL_COMMUNITY): Payer: Medicare Other

## 2014-04-25 ENCOUNTER — Encounter (HOSPITAL_BASED_OUTPATIENT_CLINIC_OR_DEPARTMENT_OTHER): Payer: Medicare Other | Admitting: Hematology and Oncology

## 2014-04-27 ENCOUNTER — Telehealth: Payer: Self-pay | Admitting: *Deleted

## 2014-04-27 ENCOUNTER — Ambulatory Visit (HOSPITAL_COMMUNITY)
Admission: RE | Admit: 2014-04-27 | Discharge: 2014-04-27 | Disposition: A | Discharge status: Home / Self Care | Payer: Medicare Other | Source: Ambulatory Visit | Attending: Family Medicine | Admitting: Family Medicine

## 2014-04-27 DIAGNOSIS — R609 Edema, unspecified: Secondary | ICD-10-CM | POA: Insufficient documentation

## 2014-04-27 DIAGNOSIS — M7989 Other specified soft tissue disorders: Secondary | ICD-10-CM | POA: Diagnosis not present

## 2014-04-27 NOTE — Progress Notes (Signed)
VASCULAR LAB PRELIMINARY  PRELIMINARY  PRELIMINARY  PRELIMINARY  Right upper extremity venous duplex completed.    Preliminary report:  Right:  No evidence of DVT or superficial thrombosis.    Matalyn Nawaz, RVT 04/27/2014, 9:37 AM

## 2014-04-27 NOTE — Telephone Encounter (Signed)
Received call from [facility] at [facility] stating pt had dopplar done today and there were no DVT noted on right arm

## 2014-05-03 ENCOUNTER — Ambulatory Visit
Admission: RE | Admit: 2014-05-03 | Discharge: 2014-05-03 | Disposition: A | Discharge status: Home / Self Care | Payer: Medicare Other | Source: Ambulatory Visit | Attending: Radiation Oncology | Admitting: Radiation Oncology

## 2014-05-03 DIAGNOSIS — I890 Lymphedema, not elsewhere classified: Secondary | ICD-10-CM | POA: Diagnosis not present

## 2014-05-03 DIAGNOSIS — Z510 Encounter for antineoplastic radiation therapy: Secondary | ICD-10-CM | POA: Diagnosis not present

## 2014-05-03 DIAGNOSIS — L599 Disorder of the skin and subcutaneous tissue related to radiation, unspecified: Secondary | ICD-10-CM | POA: Insufficient documentation

## 2014-05-03 DIAGNOSIS — C50411 Malignant neoplasm of upper-outer quadrant of right female breast: Secondary | ICD-10-CM | POA: Insufficient documentation

## 2014-05-03 DIAGNOSIS — C50412 Malignant neoplasm of upper-outer quadrant of left female breast: Secondary | ICD-10-CM | POA: Diagnosis not present

## 2014-05-03 NOTE — Progress Notes (Signed)
Name: STORMY CONNON   MRN: [id number]  Date:  05/03/2014  DOB: [birth year]  Status:outpatient    DIAGNOSIS: Breast cancer.  CONSENT VERIFIED: yes   SET UP: Patient is setup supine   IMMOBILIZATION:  The following immobilization was used:Custom Moldable Pillow, breast board.   NARRATIVE: Ms. Kronick was brought to the [facility].  Identity was confirmed.  All relevant records and images related to the planned course of therapy were reviewed.  Then, the patient was positioned in a stable reproducible clinical set-up for radiation therapy.  Wires were placed to delineate the clinical extent of breast tissue. A wire was placed on the scar as well.  CT images were obtained.  An isocenter was placed. Skin markings were placed.  The CT images were loaded into the planning software where the target and avoidance structures were contoured.  The radiation prescription was entered and confirmed. The patient was discharged in stable condition and tolerated simulation well.    TREATMENT PLANNING NOTE:  Treatment planning then occurred. I have requested : MLC's, isodose plan, basic dose calculation  I personally designed and supervised the construction of 5 medically necessary complex treatment devices for the protection of critical normal structures including the lungs and contralateral breast as well as the immobilization device which is necessary for set up certainty.   TREATMENT PLANNING NOTE/3D Simulation Note Treatment planning then occurred. I have requested : MLC's, isodose plan, basic dose calculation  3D simulation was performed.  I personally constructed 6 complex treatment devices in the form of MLCs which will be used for beam modification and to protect critical structures including the heart and lung.  I have requested a dose volume histogram of the heart lung and tumor cavity.

## 2014-05-08 DIAGNOSIS — L599 Disorder of the skin and subcutaneous tissue related to radiation, unspecified: Secondary | ICD-10-CM | POA: Diagnosis not present

## 2014-05-08 DIAGNOSIS — C50412 Malignant neoplasm of upper-outer quadrant of left female breast: Secondary | ICD-10-CM | POA: Diagnosis not present

## 2014-05-08 DIAGNOSIS — C50411 Malignant neoplasm of upper-outer quadrant of right female breast: Secondary | ICD-10-CM | POA: Diagnosis not present

## 2014-05-08 DIAGNOSIS — I890 Lymphedema, not elsewhere classified: Secondary | ICD-10-CM | POA: Diagnosis not present

## 2014-05-08 DIAGNOSIS — Z510 Encounter for antineoplastic radiation therapy: Secondary | ICD-10-CM | POA: Diagnosis not present

## 2014-05-10 ENCOUNTER — Ambulatory Visit
Admission: RE | Admit: 2014-05-10 | Discharge: 2014-05-10 | Disposition: A | Discharge status: Home / Self Care | Payer: Medicare Other | Source: Ambulatory Visit | Attending: Radiation Oncology | Admitting: Radiation Oncology

## 2014-05-10 DIAGNOSIS — C50412 Malignant neoplasm of upper-outer quadrant of left female breast: Secondary | ICD-10-CM | POA: Diagnosis not present

## 2014-05-10 DIAGNOSIS — Z510 Encounter for antineoplastic radiation therapy: Secondary | ICD-10-CM | POA: Diagnosis not present

## 2014-05-10 DIAGNOSIS — L599 Disorder of the skin and subcutaneous tissue related to radiation, unspecified: Secondary | ICD-10-CM | POA: Diagnosis not present

## 2014-05-10 DIAGNOSIS — I890 Lymphedema, not elsewhere classified: Secondary | ICD-10-CM | POA: Diagnosis not present

## 2014-05-10 DIAGNOSIS — C50411 Malignant neoplasm of upper-outer quadrant of right female breast: Secondary | ICD-10-CM | POA: Diagnosis not present

## 2014-05-14 ENCOUNTER — Ambulatory Visit
Admission: RE | Admit: 2014-05-14 | Discharge: 2014-05-14 | Disposition: A | Discharge status: Home / Self Care | Payer: Medicare Other | Source: Ambulatory Visit | Attending: Radiation Oncology | Admitting: Radiation Oncology

## 2014-05-14 DIAGNOSIS — C50412 Malignant neoplasm of upper-outer quadrant of left female breast: Secondary | ICD-10-CM | POA: Diagnosis not present

## 2014-05-14 DIAGNOSIS — Z510 Encounter for antineoplastic radiation therapy: Secondary | ICD-10-CM | POA: Diagnosis not present

## 2014-05-14 DIAGNOSIS — C50411 Malignant neoplasm of upper-outer quadrant of right female breast: Secondary | ICD-10-CM | POA: Diagnosis not present

## 2014-05-14 DIAGNOSIS — L599 Disorder of the skin and subcutaneous tissue related to radiation, unspecified: Secondary | ICD-10-CM | POA: Diagnosis not present

## 2014-05-14 DIAGNOSIS — I890 Lymphedema, not elsewhere classified: Secondary | ICD-10-CM | POA: Diagnosis not present

## 2014-05-14 MED ORDER — ALRA NON-METALLIC DEODORANT (RAD-ONC)
1.0000 "application " | Freq: Once | TOPICAL | Status: AC
  Administered 2014-05-14: 1 via TOPICAL

## 2014-05-14 MED ORDER — RADIAPLEXRX EX GEL
Freq: Once | CUTANEOUS | Status: AC
  Administered 2014-05-14: 10:00:00 via TOPICAL

## 2014-05-14 NOTE — Progress Notes (Signed)
Patient and family members here for patient education.Reviewed clinic routine.see doctor generally every Tuesday after radiation treatment.Given Radiation Therapy and You Booklet, alra deodorant, radiaplex and skin care sheet.Reviewed side effects to include skin discoloration, fatigue, tenderness and swelling.Patient able to verbalize back application of radiaplex twice daily after treatment and at bedtime.

## 2014-05-15 ENCOUNTER — Ambulatory Visit
Admission: RE | Admit: 2014-05-15 | Discharge: 2014-05-15 | Disposition: A | Discharge status: Home / Self Care | Payer: Medicare Other | Source: Ambulatory Visit | Attending: Radiation Oncology | Admitting: Radiation Oncology

## 2014-05-15 ENCOUNTER — Ambulatory Visit: Admission: RE | Admit: 2014-05-15 | Payer: Medicare Other | Source: Ambulatory Visit | Admitting: Radiation Oncology

## 2014-05-15 DIAGNOSIS — Z510 Encounter for antineoplastic radiation therapy: Secondary | ICD-10-CM | POA: Diagnosis not present

## 2014-05-15 DIAGNOSIS — I890 Lymphedema, not elsewhere classified: Secondary | ICD-10-CM | POA: Diagnosis not present

## 2014-05-15 DIAGNOSIS — C50412 Malignant neoplasm of upper-outer quadrant of left female breast: Secondary | ICD-10-CM | POA: Diagnosis not present

## 2014-05-15 DIAGNOSIS — C50411 Malignant neoplasm of upper-outer quadrant of right female breast: Secondary | ICD-10-CM | POA: Diagnosis not present

## 2014-05-15 DIAGNOSIS — L599 Disorder of the skin and subcutaneous tissue related to radiation, unspecified: Secondary | ICD-10-CM | POA: Diagnosis not present

## 2014-05-16 ENCOUNTER — Ambulatory Visit
Admission: RE | Admit: 2014-05-16 | Discharge: 2014-05-16 | Disposition: A | Discharge status: Home / Self Care | Payer: Medicare Other | Source: Ambulatory Visit | Attending: Radiation Oncology | Admitting: Radiation Oncology

## 2014-05-16 DIAGNOSIS — L599 Disorder of the skin and subcutaneous tissue related to radiation, unspecified: Secondary | ICD-10-CM | POA: Diagnosis not present

## 2014-05-16 DIAGNOSIS — C50412 Malignant neoplasm of upper-outer quadrant of left female breast: Secondary | ICD-10-CM | POA: Diagnosis not present

## 2014-05-16 DIAGNOSIS — C50411 Malignant neoplasm of upper-outer quadrant of right female breast: Secondary | ICD-10-CM | POA: Diagnosis not present

## 2014-05-16 DIAGNOSIS — Z510 Encounter for antineoplastic radiation therapy: Secondary | ICD-10-CM | POA: Diagnosis not present

## 2014-05-16 DIAGNOSIS — I890 Lymphedema, not elsewhere classified: Secondary | ICD-10-CM | POA: Diagnosis not present

## 2014-05-17 ENCOUNTER — Ambulatory Visit
Admission: RE | Admit: 2014-05-17 | Discharge: 2014-05-17 | Disposition: A | Discharge status: Home / Self Care | Payer: Medicare Other | Source: Ambulatory Visit | Attending: Radiation Oncology | Admitting: Radiation Oncology

## 2014-05-17 ENCOUNTER — Other Ambulatory Visit: Payer: Self-pay | Admitting: Radiation Oncology

## 2014-05-17 DIAGNOSIS — I890 Lymphedema, not elsewhere classified: Secondary | ICD-10-CM | POA: Diagnosis not present

## 2014-05-17 DIAGNOSIS — C50411 Malignant neoplasm of upper-outer quadrant of right female breast: Secondary | ICD-10-CM

## 2014-05-17 DIAGNOSIS — Z510 Encounter for antineoplastic radiation therapy: Secondary | ICD-10-CM | POA: Diagnosis not present

## 2014-05-17 DIAGNOSIS — L599 Disorder of the skin and subcutaneous tissue related to radiation, unspecified: Secondary | ICD-10-CM | POA: Diagnosis not present

## 2014-05-17 DIAGNOSIS — C50412 Malignant neoplasm of upper-outer quadrant of left female breast: Secondary | ICD-10-CM | POA: Diagnosis not present

## 2014-05-17 NOTE — Progress Notes (Signed)
Weekly Management Note Current Dose: 7.2  Gy  Projected Dose: 61 Gy   Narrative:  The patient presents for routine under treatment assessment.  CBCT/MVCT images/Port film x-rays were reviewed.  The chart was checked. She is doing well. She complains of swelling in her right hand since about January.   Physical Findings: right hand lymphedema. Alert and oriented in a wheelchair.   Impression:  The patient is tolerating radiation.  Plan:  Continue treatment as planned. Refer to PT>.

## 2014-05-18 ENCOUNTER — Ambulatory Visit
Admission: RE | Admit: 2014-05-18 | Discharge: 2014-05-18 | Disposition: A | Discharge status: Home / Self Care | Payer: Medicare Other | Source: Ambulatory Visit | Attending: Radiation Oncology | Admitting: Radiation Oncology

## 2014-05-18 DIAGNOSIS — Z510 Encounter for antineoplastic radiation therapy: Secondary | ICD-10-CM | POA: Diagnosis not present

## 2014-05-18 DIAGNOSIS — C50411 Malignant neoplasm of upper-outer quadrant of right female breast: Secondary | ICD-10-CM | POA: Diagnosis not present

## 2014-05-18 DIAGNOSIS — L599 Disorder of the skin and subcutaneous tissue related to radiation, unspecified: Secondary | ICD-10-CM | POA: Diagnosis not present

## 2014-05-18 DIAGNOSIS — I890 Lymphedema, not elsewhere classified: Secondary | ICD-10-CM | POA: Diagnosis not present

## 2014-05-18 DIAGNOSIS — C50412 Malignant neoplasm of upper-outer quadrant of left female breast: Secondary | ICD-10-CM | POA: Diagnosis not present

## 2014-05-18 DIAGNOSIS — R269 Unspecified abnormalities of gait and mobility: Secondary | ICD-10-CM | POA: Diagnosis not present

## 2014-05-21 ENCOUNTER — Ambulatory Visit: Payer: Medicare Other | Admitting: Physical Therapy

## 2014-05-21 ENCOUNTER — Other Ambulatory Visit: Payer: Self-pay | Admitting: Endocrinology

## 2014-05-21 ENCOUNTER — Ambulatory Visit
Admission: RE | Admit: 2014-05-21 | Discharge: 2014-05-21 | Disposition: A | Discharge status: Home / Self Care | Payer: Medicare Other | Source: Ambulatory Visit | Attending: Radiation Oncology | Admitting: Radiation Oncology

## 2014-05-21 DIAGNOSIS — L599 Disorder of the skin and subcutaneous tissue related to radiation, unspecified: Secondary | ICD-10-CM | POA: Diagnosis not present

## 2014-05-21 DIAGNOSIS — C50412 Malignant neoplasm of upper-outer quadrant of left female breast: Secondary | ICD-10-CM | POA: Diagnosis not present

## 2014-05-21 DIAGNOSIS — Z510 Encounter for antineoplastic radiation therapy: Secondary | ICD-10-CM | POA: Diagnosis not present

## 2014-05-21 DIAGNOSIS — I890 Lymphedema, not elsewhere classified: Secondary | ICD-10-CM | POA: Diagnosis not present

## 2014-05-21 DIAGNOSIS — C50411 Malignant neoplasm of upper-outer quadrant of right female breast: Secondary | ICD-10-CM | POA: Diagnosis not present

## 2014-05-22 ENCOUNTER — Ambulatory Visit
Admission: RE | Admit: 2014-05-22 | Discharge: 2014-05-22 | Disposition: A | Discharge status: Home / Self Care | Payer: Medicare Other | Source: Ambulatory Visit | Attending: Radiation Oncology | Admitting: Radiation Oncology

## 2014-05-22 ENCOUNTER — Encounter: Payer: Self-pay | Admitting: Radiation Oncology

## 2014-05-22 VITALS — BP 136/65 | HR 54 | Temp 98.6°F | Resp 16 | Wt 187.6 lb

## 2014-05-22 DIAGNOSIS — C50411 Malignant neoplasm of upper-outer quadrant of right female breast: Secondary | ICD-10-CM | POA: Diagnosis not present

## 2014-05-22 DIAGNOSIS — Z510 Encounter for antineoplastic radiation therapy: Secondary | ICD-10-CM | POA: Diagnosis not present

## 2014-05-22 DIAGNOSIS — L599 Disorder of the skin and subcutaneous tissue related to radiation, unspecified: Secondary | ICD-10-CM | POA: Diagnosis not present

## 2014-05-22 DIAGNOSIS — I890 Lymphedema, not elsewhere classified: Secondary | ICD-10-CM | POA: Diagnosis not present

## 2014-05-22 DIAGNOSIS — C50412 Malignant neoplasm of upper-outer quadrant of left female breast: Secondary | ICD-10-CM | POA: Diagnosis not present

## 2014-05-22 NOTE — Progress Notes (Signed)
  Radiation Oncology         [phone] ________________________________  Name: Whitney [name] MRN: [id number]  Date: 05/22/2014  DOB: Dec 04, 1927  Weekly Radiation Therapy Management  Current Dose: 12.6 Gy     Planned Dose:  45 Gy  Narrative . . . . . . . . The patient presents for routine under treatment assessment.                                   The patient is without complaint. She undergo evaluation in the lymphedema clinic tomorrow.                                 Set-up films were reviewed.                                 The chart was checked. Physical Findings. . .  weight is 187 lb 9.6 oz (85.095 kg). Her oral temperature is 98.6 F (37 C). Her blood pressure is 136/65 and her pulse is 54. Her respiration is 16. . Minimal hyperpigmentation changes in the right breast. Impression . . . . . . . The patient is tolerating radiation. Plan . . . . . . . . . . . . Continue treatment as planned.  ________________________________   Blair Promise, PhD, MD

## 2014-05-22 NOTE — Progress Notes (Signed)
Weekly rad txs 7/33 completed right breast, no skin changes, uses radiaplex bid, in w/c, has lymphedema right arm to hand, has scheduled to go to the lymhedema clinic tomorrow, no c/o pain or discomfort, appetite good 9:58 AM

## 2014-05-23 ENCOUNTER — Ambulatory Visit
Admission: RE | Admit: 2014-05-23 | Discharge: 2014-05-23 | Disposition: A | Discharge status: Home / Self Care | Payer: Medicare Other | Source: Ambulatory Visit | Attending: Radiation Oncology | Admitting: Radiation Oncology

## 2014-05-23 ENCOUNTER — Ambulatory Visit: Payer: Medicare Other | Attending: Radiation Oncology | Admitting: Physical Therapy

## 2014-05-23 DIAGNOSIS — L599 Disorder of the skin and subcutaneous tissue related to radiation, unspecified: Secondary | ICD-10-CM | POA: Diagnosis not present

## 2014-05-23 DIAGNOSIS — C50411 Malignant neoplasm of upper-outer quadrant of right female breast: Secondary | ICD-10-CM | POA: Insufficient documentation

## 2014-05-23 DIAGNOSIS — Z510 Encounter for antineoplastic radiation therapy: Secondary | ICD-10-CM | POA: Diagnosis not present

## 2014-05-23 DIAGNOSIS — I890 Lymphedema, not elsewhere classified: Secondary | ICD-10-CM | POA: Diagnosis not present

## 2014-05-23 DIAGNOSIS — C50412 Malignant neoplasm of upper-outer quadrant of left female breast: Secondary | ICD-10-CM | POA: Diagnosis not present

## 2014-05-23 NOTE — Therapy (Signed)
[place], [place], [address] Phone: [phone]   Fax:  [phone]  Physical Therapy Evaluation  Patient Details  Name: Whitney [name] MRN: [id number] Date of Birth: 1927-04-03 Referring Provider:  [name], MD  Encounter Date: 05/23/2014      PT End of Session - 05/23/14 1544    Visit Number 1   Number of Visits 19   Date for PT Re-Evaluation 07/13/14   PT Start Time 1450   PT Stop Time 1538   PT Time Calculation (min) 48 min   Activity Tolerance Patient tolerated treatment well   Behavior During Therapy [facility] for tasks assessed/performed      Past Medical History  Diagnosis Date  . Hypertension   . Hyperlipidemia   . Diabetes mellitus without complication   . Thyroid disease     hypothyroidism  . Anemia   . Former smoker   . Multinodular goiter   . Complication of anesthesia     slow to wake up  . Cancer     right breast  . Wears glasses   . Full dentures   . Dysrhythmia 10/15    AF  . Breast cancer 10/2013    right upper outer  . Atrial fibrillation     Past Surgical History  Procedure Laterality Date  . Abdominal hysterectomy    . Portacath placement N/A 11/15/2013    Procedure: INSERTION PORT-A-CATH WITH ULTRA SOUND AND FLOROSCOPY;  Surgeon: [name], MD;  Location: [place];  Service: General;  Laterality: N/A;  . Eye surgery  12/22/2009    cataracts  . Total knee arthroplasty  2002    left  . Port-a-cath removal Right 01/09/2014    Procedure: REMOVAL PORT-A-CATH;  Surgeon: [name], MD;  Location: [place];  Service: General;  Laterality: Right;  . Breast lumpectomy with radioactive seed localization Right 01/09/2014    Procedure: RIGHT BREAST SEED LOCALIZED LUMPECTOMY ;  Surgeon: [name], MD;  Location: [place];  Service: General;  Laterality: Right;  . Axillary lymph node dissection Right 01/09/2014     Procedure: RIGHT AXILLARY LYMPH NODE DISECTION;  Surgeon: [name], MD;  Location: [place];  Service: General;  Laterality: Right;  . Intramedullary (im) nail intertrochanteric Left 02/24/2014    Procedure: IM ROD LEFT HIP FX;  Surgeon: Alta Corning, MD;  Location: [place];  Service: Orthopedics;  Laterality: Left;    There were no vitals taken for this visit.  Visit Diagnosis:  Lymphedema of upper extremity - Plan: PT plan of care cert/re-cert      Subjective Assessment - 05/23/14 1455    Symptoms My hand and arm have swollen.   Pertinent History Right breast cancer diagnosed 10/23/13; had neo-adjuvant chemotherapy but developed atrial fibrillation and was hospitalized.  Had lumpectomy in August; 5 lymph nodes removed.  [place] and broke hip in December 2015 and had rod placed (left side).  Now doing radiation, which is due to end June 27, 2014.  Diabetes, HTN, atrial fibrillation--on meds for these.   Patient Stated Goals reduce the swelling   Currently in Pain? No/denies          [facility] PT Assessment - 05/23/14 0001    Assessment   Medical Diagnosis right breast cancer   Precautions   Precautions Other (comment)   Precaution Comments fall risk; cancer precautions   Restrictions   Weight Bearing Restrictions No   Balance  Screen   Has the patient fallen in the past 6 months Yes   How many times? once   Has the patient had a decrease in activity level because of a fear of falling?  Yes   Is the patient reluctant to leave their home because of a fear of falling?  Yes   [place] Private residence   Living Arrangements Other relatives  granddaughter   Home Access Ramped entrance   [place] One level   Prior Function   Level of [place] for independence;Independent with basic ADLs;Independent with homemaking with ambulation   Vocation Unemployed   Leisure wants to get back to household chores    Observation/Other Assessments   Other Surveys  Select   Posture/Postural Control   Posture/Postural Control Postural limitations   Postural Limitations Rounded Shoulders;Forward head   ROM / Strength   AROM / PROM / Strength AROM   AROM   Overall AROM Comments Bilateral shoulder AROM WFL (for age); fairly even right to left, with slightly less motion right.   AROM Assessment Site Shoulder   Right/Left Shoulder Right;Left   Ambulation/Gait   Ambulation/Gait Yes   Ambulation/Gait Assistance 5: Supervision   Assistive device Small based quad cane           LYMPHEDEMA/ONCOLOGY QUESTIONNAIRE - 05/23/14 1509    Type   Cancer Type right breast   Surgeries   Lumpectomy Date 10/23/13   Number Lymph Nodes Removed 5   Treatment   Active Radiation Treatment Yes   Date --  due to finish 06/27/14   What other symptoms do you have   Are you having Pain No   Are you having pitting edema Yes   [place] Sign No  negative   Lymphedema Assessments   Lymphedema Assessments Upper extremities   Right Upper Extremity Lymphedema   10 cm Proximal to Olecranon Process 31.4 cm   Olecranon Process 30 cm   15 cm Proximal to Ulnar Styloid Process 27.6 cm   10 cm Proximal to Ulnar Styloid Process 24.4 cm   Just Proximal to Ulnar Styloid Process 20.9 cm   Across Hand at [employer] 21.8 cm   At [place] of 2nd Digit 7.4 cm   Left Upper Extremity Lymphedema   10 cm Proximal to Olecranon Process 28.5 cm   Olecranon Process 24.8 cm   15 cm Proximal to Ulnar Styloid Process 22.2 cm   10 cm Proximal to Ulnar Styloid Process 19.3 cm   Just Proximal to Ulnar Styloid Process 16.2 cm   Across Hand at [employer] 19.8 cm   At [place] of 2nd Digit 6.6 cm           Quick Dash - 05/23/14 0001    Open a tight or new jar Unable   Do heavy household chores (wash walls, wash floors) Mild difficulty   Carry a shopping bag or briefcase Mild difficulty   Wash your back No difficulty   Use a knife to cut  food Mild difficulty   During the past week, to what extent has your arm, shoulder or hand problem interfered with your normal social activities with family, friends, neighbors, or groups? Not at all   During the past week, to what extent has your arm, shoulder or hand problem limited your work or other regular daily activities Slightly   Arm, shoulder, or hand pain. None   Tingling (pins and needles) in your arm,  shoulder, or hand None   Difficulty Sleeping No difficulty   DASH Score 15.91 %                    PT Education - 06/21/2014 1543    Education provided Yes   Education Details Living Beyond Breast Cancer lymphedema pamphlet given; educated about infection risk, signs, and treatment; also educated about lymphedema as a chronic, manageable condition.   Person(s) Educated Patient;Other (comment)  granddaughter/caregiver   Methods Handout;Explanation   Comprehension Need further instruction;Verbalized understanding           Short Term Clinic Goals - 06-21-14 1547    CC Short Term Goal  #1   Title Pt. will be knowledgeable about lymphedema risk reduction.   Time 3   Period Weeks   Status New   CC Short Term Goal  #2   Title Right arm circumference at 10 cm. proximal to ulnar styloid reduced by at least 1 cm.   Baseline 24.4 cm.   Time 3   Period Weeks   Status New             Long Term Clinic Goals - 21-Jun-2014 1549    CC Long Term Goal  #1   Title Circumference at 10 cm. proximal to right ulnar styloid reduced by 2 cm.   Baseline 24.4 cm.   Time 6   Period Weeks   Status New   CC Long Term Goal  #2   Title Will be knowledgeable about where/how to obtain compression sleeve and glove; also about other equipment available to help manage swelling.   Time 6   Period Weeks   Status New   CC Long Term Goal  #3   Title Will be knowledgeable about self-care for lymphedema, including manual lymph drainage.   Baseline 6   Period Weeks             Plan - 06-21-2014 1545    Clinical Impression Statement Patient with new onset of significant pitting edema in right UE, particularly hand and forearm will benefit from complete decongestive therapy.   Pt will benefit from skilled therapeutic intervention in order to improve on the following deficits Increased edema;Decreased knowledge of precautions   Rehab Potential Excellent   Clinical Impairments Affecting Rehab Potential Currently receiving radiation therapy.   PT Frequency 3x / week   PT Duration 6 weeks   PT Treatment/Interventions Manual lymph drainage;Compression bandaging;Patient/family education;DME Instruction   PT Next Visit Plan Begin complete decongestive therapy if bandaging okayed by radiation oncologist (I have sent an Inbasket message asking about this).   Consulted and Agree with Plan of Care Patient;Family member/caregiver          G-Codes - 06-21-2014 1552    Functional Assessment Tool Used quick dash   Functional Limitation Carrying, moving and handling objects   Carrying, Moving and Handling Objects Current Status [phone]) At least 1 percent but less than 20 percent impaired, limited or restricted   Carrying, Moving and Handling Objects Goal Status ([id number]) At least 1 percent but less than 20 percent impaired, limited or restricted       Problem List Patient Active Problem List   Diagnosis Date Noted  . Acute blood loss anemia 02/25/2014  . Closed left hip fracture 02/24/2014  . Breast cancer of upper-outer quadrant of left female breast 02/14/2014  . PAF (paroxysmal atrial fibrillation) 01/30/2014  . Anticoagulated 01/30/2014  . Chronic diastolic CHF (congestive heart failure), NYHA class  2 12/10/2013  . Preoperative cardiovascular examination 12/10/2013  . Neutropenic fever 12/08/2013  . Anemia of chronic disease 12/08/2013  . Thrombocytopenia 12/08/2013  . Hypokalemia 12/08/2013  . Pancytopenia due to antineoplastic chemotherapy 12/08/2013  .  Hypothyroidism 12/08/2013  . Breast cancer of upper-outer quadrant of right female breast 11/01/2013  . Essential hypertension, benign 12/01/2012  . Pure hypercholesterolemia 12/01/2012  . DM2 (diabetes mellitus, type 2) 11/21/2012    Akiya Morr 05/23/2014, 3:56 PM  [place], [place], [address] Phone: [phone]   Fax:  [place], PT 05/23/2014 3:56 PM

## 2014-05-24 ENCOUNTER — Ambulatory Visit
Admission: RE | Admit: 2014-05-24 | Discharge: 2014-05-24 | Disposition: A | Discharge status: Home / Self Care | Payer: Medicare Other | Source: Ambulatory Visit | Attending: Radiation Oncology | Admitting: Radiation Oncology

## 2014-05-24 DIAGNOSIS — L599 Disorder of the skin and subcutaneous tissue related to radiation, unspecified: Secondary | ICD-10-CM | POA: Diagnosis not present

## 2014-05-24 DIAGNOSIS — C50411 Malignant neoplasm of upper-outer quadrant of right female breast: Secondary | ICD-10-CM | POA: Diagnosis not present

## 2014-05-24 DIAGNOSIS — I890 Lymphedema, not elsewhere classified: Secondary | ICD-10-CM | POA: Diagnosis not present

## 2014-05-24 DIAGNOSIS — Z510 Encounter for antineoplastic radiation therapy: Secondary | ICD-10-CM | POA: Diagnosis not present

## 2014-05-24 DIAGNOSIS — C50412 Malignant neoplasm of upper-outer quadrant of left female breast: Secondary | ICD-10-CM | POA: Diagnosis not present

## 2014-05-25 ENCOUNTER — Ambulatory Visit
Admission: RE | Admit: 2014-05-25 | Discharge: 2014-05-25 | Disposition: A | Discharge status: Home / Self Care | Payer: Medicare Other | Source: Ambulatory Visit | Attending: Radiation Oncology | Admitting: Radiation Oncology

## 2014-05-25 ENCOUNTER — Other Ambulatory Visit: Payer: Self-pay | Admitting: Family Medicine

## 2014-05-25 DIAGNOSIS — I890 Lymphedema, not elsewhere classified: Secondary | ICD-10-CM | POA: Diagnosis not present

## 2014-05-25 DIAGNOSIS — L599 Disorder of the skin and subcutaneous tissue related to radiation, unspecified: Secondary | ICD-10-CM | POA: Diagnosis not present

## 2014-05-25 DIAGNOSIS — C50412 Malignant neoplasm of upper-outer quadrant of left female breast: Secondary | ICD-10-CM | POA: Diagnosis not present

## 2014-05-25 DIAGNOSIS — C50411 Malignant neoplasm of upper-outer quadrant of right female breast: Secondary | ICD-10-CM | POA: Diagnosis not present

## 2014-05-25 DIAGNOSIS — Z510 Encounter for antineoplastic radiation therapy: Secondary | ICD-10-CM | POA: Diagnosis not present

## 2014-05-28 ENCOUNTER — Ambulatory Visit: Payer: Medicare Other | Admitting: Physical Therapy

## 2014-05-28 ENCOUNTER — Ambulatory Visit
Admission: RE | Admit: 2014-05-28 | Discharge: 2014-05-28 | Disposition: A | Discharge status: Home / Self Care | Payer: Medicare Other | Source: Ambulatory Visit | Attending: Radiation Oncology | Admitting: Radiation Oncology

## 2014-05-28 DIAGNOSIS — C50411 Malignant neoplasm of upper-outer quadrant of right female breast: Secondary | ICD-10-CM | POA: Diagnosis not present

## 2014-05-28 DIAGNOSIS — L599 Disorder of the skin and subcutaneous tissue related to radiation, unspecified: Secondary | ICD-10-CM | POA: Diagnosis not present

## 2014-05-28 DIAGNOSIS — I890 Lymphedema, not elsewhere classified: Secondary | ICD-10-CM

## 2014-05-28 DIAGNOSIS — C50412 Malignant neoplasm of upper-outer quadrant of left female breast: Secondary | ICD-10-CM | POA: Diagnosis not present

## 2014-05-28 DIAGNOSIS — Z510 Encounter for antineoplastic radiation therapy: Secondary | ICD-10-CM | POA: Diagnosis not present

## 2014-05-28 NOTE — Therapy (Signed)
[place], [place], [address] Phone: [phone]   Fax:  [phone]  Physical Therapy Treatment  Patient Details  Name: Whitney [name] MRN: [id number] Date of Birth: [birth year] Referring Provider:  [name], MD  Encounter Date: 05/28/2014      PT End of Session - 05/28/14 2222    Visit Number 2   Number of Visits 19   Date for PT Re-Evaluation 07/13/14   PT Start Time 0804   PT Stop Time 0849   PT Time Calculation (min) 45 min   Activity Tolerance Patient tolerated treatment well   Behavior During Therapy [facility] for tasks assessed/performed      Past Medical History  Diagnosis Date  . Hypertension   . Hyperlipidemia   . Diabetes mellitus without complication   . Thyroid disease     hypothyroidism  . Anemia   . Former smoker   . Multinodular goiter   . Complication of anesthesia     slow to wake up  . Cancer     right breast  . Wears glasses   . Full dentures   . Dysrhythmia 10/15    AF  . Breast cancer 10/2013    right upper outer  . Atrial fibrillation     Past Surgical History  Procedure Laterality Date  . Abdominal hysterectomy    . Portacath placement N/A 11/15/2013    Procedure: INSERTION PORT-A-CATH WITH ULTRA SOUND AND FLOROSCOPY;  Surgeon: [name], MD;  Location: [place];  Service: General;  Laterality: N/A;  . Eye surgery  12/22/2009    cataracts  . Total knee arthroplasty  2002    left  . Port-a-cath removal Right 01/09/2014    Procedure: REMOVAL PORT-A-CATH;  Surgeon: [name], MD;  Location: [place];  Service: General;  Laterality: Right;  . Breast lumpectomy with radioactive seed localization Right 01/09/2014    Procedure: RIGHT BREAST SEED LOCALIZED LUMPECTOMY ;  Surgeon: [name], MD;  Location: [place];  Service: General;  Laterality: Right;  . Axillary lymph node dissection Right 01/09/2014   Procedure: RIGHT AXILLARY LYMPH NODE DISECTION;  Surgeon: [name], MD;  Location: [place];  Service: General;  Laterality: Right;  . Intramedullary (im) nail intertrochanteric Left 02/24/2014    Procedure: IM ROD LEFT HIP FX;  Surgeon: Alta Corning, MD;  Location: [place];  Service: Orthopedics;  Laterality: Left;    There were no vitals filed for this visit.  Visit Diagnosis:  Lymphedema of upper extremity      Subjective Assessment - 05/28/14 0807    Symptoms (p) Nothing new.   Currently in Pain? (p) No/denies                       [facility] Adult PT Treatment/Exercise - 05/28/14 0001    Manual Therapy   Manual Therapy Edema management;Manual Lymphatic Drainage (MLD);Compression Bandaging   Manual Lymphatic Drainage (MLD) In supine, short neck, superficial and deep abdomen with instruction in diaphragmatic breathing, left axilla and anterior interaxillary anastomosis, right groin and axillo-inguinal anastomosis, and right UE from fingers to shoulder.   Compression Bandaging Lotion applied to right UE.  Bandaging with Elastomull on first four fingers and TG soft to right hand and arm to shoulder.  Other compression bandaging not done today; awaiting response from Dr. Pablo Ledger about whether this will be a problem for her XRT.  PT Education - 05/28/14 2221    Education provided Yes   Education Details Instructed in lymphedema risk reduction information as per National Lymphedema Network guidelines.  Instructed in basic princliples of manual lymph drainage.   Person(s) Educated Patient   Methods Explanation;Handout   Comprehension Need further instruction           Short Term Clinic Goals - 05/23/14 1547    CC Short Term Goal  #1   Title Pt. will be knowledgeable about lymphedema risk reduction.   Time 3   Period Weeks   Status New   CC Short Term Goal  #2   Title Right arm circumference at 10 cm. proximal to ulnar  styloid reduced by at least 1 cm.   Baseline 24.4 cm.   Time 3   Period Weeks   Status New             Long Term Clinic Goals - 05/23/14 1549    CC Long Term Goal  #1   Title Circumference at 10 cm. proximal to right ulnar styloid reduced by 2 cm.   Baseline 24.4 cm.   Time 6   Period Weeks   Status New   CC Long Term Goal  #2   Title Will be knowledgeable about where/how to obtain compression sleeve and glove; also about other equipment available to help manage swelling.   Time 6   Period Weeks   Status New   CC Long Term Goal  #3   Title Will be knowledgeable about self-care for lymphedema, including manual lymph drainage.   Baseline 6   Period Weeks            Plan - 05/28/14 2222    Clinical Impression Statement Pt. did well with initial treatment; awaiting word from radiation oncologist about whether bandaging will be a problem for patient's current XRT.   Pt will benefit from skilled therapeutic intervention in order to improve on the following deficits Increased edema;Decreased knowledge of precautions   PT Treatment/Interventions Manual lymph drainage;Compression bandaging;Patient/family education;DME Instruction   PT Next Visit Plan If okayed by radiation oncologist, do complete decongestive therapy including bandaging; at least continue  manual lymph drainage and instruction.        Problem List Patient Active Problem List   Diagnosis Date Noted  . Acute blood loss anemia 02/25/2014  . Closed left hip fracture 02/24/2014  . Breast cancer of upper-outer quadrant of left female breast 02/14/2014  . PAF (paroxysmal atrial fibrillation) 01/30/2014  . Anticoagulated 01/30/2014  . Chronic diastolic CHF (congestive heart failure), NYHA class 2 12/10/2013  . Preoperative cardiovascular examination 12/10/2013  . Neutropenic fever 12/08/2013  . Anemia of chronic disease 12/08/2013  . Thrombocytopenia 12/08/2013  . Hypokalemia 12/08/2013  . Pancytopenia due  to antineoplastic chemotherapy 12/08/2013  . Hypothyroidism 12/08/2013  . Breast cancer of upper-outer quadrant of right female breast 11/01/2013  . Essential hypertension, benign 12/01/2012  . Pure hypercholesterolemia 12/01/2012  . DM2 (diabetes mellitus, type 2) 11/21/2012    Jhayla Podgorski 05/28/2014, 10:26 PM  [place], [place], [address] Phone: [phone]   Fax:  [place], PT 05/28/2014 10:26 PM

## 2014-05-29 ENCOUNTER — Ambulatory Visit
Admission: RE | Admit: 2014-05-29 | Discharge: 2014-05-29 | Disposition: A | Discharge status: Home / Self Care | Payer: Medicare Other | Source: Ambulatory Visit | Attending: Radiation Oncology | Admitting: Radiation Oncology

## 2014-05-29 VITALS — BP 137/60 | HR 54 | Temp 98.4°F | Wt 187.3 lb

## 2014-05-29 DIAGNOSIS — I890 Lymphedema, not elsewhere classified: Secondary | ICD-10-CM | POA: Diagnosis not present

## 2014-05-29 DIAGNOSIS — C50411 Malignant neoplasm of upper-outer quadrant of right female breast: Secondary | ICD-10-CM | POA: Diagnosis not present

## 2014-05-29 DIAGNOSIS — C50412 Malignant neoplasm of upper-outer quadrant of left female breast: Secondary | ICD-10-CM | POA: Diagnosis not present

## 2014-05-29 DIAGNOSIS — Z510 Encounter for antineoplastic radiation therapy: Secondary | ICD-10-CM | POA: Diagnosis not present

## 2014-05-29 DIAGNOSIS — S72142S Displaced intertrochanteric fracture of left femur, sequela: Secondary | ICD-10-CM | POA: Diagnosis not present

## 2014-05-29 DIAGNOSIS — L599 Disorder of the skin and subcutaneous tissue related to radiation, unspecified: Secondary | ICD-10-CM | POA: Diagnosis not present

## 2014-05-29 NOTE — Progress Notes (Addendum)
Weekly Management Note Current Dose:  21.6 Gy  Projected Dose: 61 Gy   Narrative:  The patient presents for routine under treatment assessment.  CBCT/MVCT images/Port film x-rays were reviewed.  The chart was checked. Doing well. PT is helping. Arm is improved. Wrapping she did not feel change her position yesterday. Minor skin changes. Fatigue continues.   Physical Findings: Weight: 187 lb 4.8 oz (84.959 kg). Slightly pink right breast. ARm in loose wrap.   Impression:  The patient is tolerating radiation.  Plan:  Continue treatment as planned. Continue PT. Prefer no warp for now due to concerns of patient set up. Loose wrap OK. Continue biafene.

## 2014-05-29 NOTE — Progress Notes (Signed)
Weekly assessment of radiation to right [email or web address] 12 of 33 treatments.Skin mildly tanned.No pian.Started physical therapy x 2 sessions so far.No concerns today.

## 2014-05-30 ENCOUNTER — Ambulatory Visit
Admission: RE | Admit: 2014-05-30 | Discharge: 2014-05-30 | Disposition: A | Discharge status: Home / Self Care | Payer: Medicare Other | Source: Ambulatory Visit | Attending: Radiation Oncology | Admitting: Radiation Oncology

## 2014-05-30 ENCOUNTER — Ambulatory Visit: Payer: Medicare Other

## 2014-05-30 DIAGNOSIS — Z510 Encounter for antineoplastic radiation therapy: Secondary | ICD-10-CM | POA: Diagnosis not present

## 2014-05-30 DIAGNOSIS — C50411 Malignant neoplasm of upper-outer quadrant of right female breast: Secondary | ICD-10-CM | POA: Diagnosis not present

## 2014-05-30 DIAGNOSIS — I890 Lymphedema, not elsewhere classified: Secondary | ICD-10-CM

## 2014-05-30 DIAGNOSIS — C50412 Malignant neoplasm of upper-outer quadrant of left female breast: Secondary | ICD-10-CM | POA: Diagnosis not present

## 2014-05-30 DIAGNOSIS — L599 Disorder of the skin and subcutaneous tissue related to radiation, unspecified: Secondary | ICD-10-CM | POA: Diagnosis not present

## 2014-05-30 NOTE — Therapy (Signed)
[place], [place], [address] Phone: [phone]   Fax:  [phone]  Physical Therapy Treatment  Patient Details  Name: Whitney [name] MRN: [id number] Date of Birth: 01-30-1928 Referring Provider:  [name], MD  Encounter Date: 05/30/2014      PT End of Session - 05/30/14 1158    Visit Number 3   Number of Visits 19   Date for PT Re-Evaluation 07/13/14   PT Start Time 9798   PT Stop Time 1105   PT Time Calculation (min) 36 min      Past Medical History  Diagnosis Date  . Hypertension   . Hyperlipidemia   . Diabetes mellitus without complication   . Thyroid disease     hypothyroidism  . Anemia   . Former smoker   . Multinodular goiter   . Complication of anesthesia     slow to wake up  . Cancer     right breast  . Wears glasses   . Full dentures   . Dysrhythmia 10/15    AF  . Breast cancer 10/2013    right upper outer  . Atrial fibrillation     Past Surgical History  Procedure Laterality Date  . Abdominal hysterectomy    . Portacath placement N/A 11/15/2013    Procedure: INSERTION PORT-A-CATH WITH ULTRA SOUND AND FLOROSCOPY;  Surgeon: [name], MD;  Location: [facility];  Service: General;  Laterality: N/A;  . Eye surgery  12/22/2009    cataracts  . Total knee arthroplasty  2002    left  . Port-a-cath removal Right 01/09/2014    Procedure: REMOVAL PORT-A-CATH;  Surgeon: [name], MD;  Location: [facility];  Service: General;  Laterality: Right;  . Breast lumpectomy with radioactive seed localization Right 01/09/2014    Procedure: RIGHT BREAST SEED LOCALIZED LUMPECTOMY ;  Surgeon: [name], MD;  Location: [facility];  Service: General;  Laterality: Right;  . Axillary lymph node dissection Right 01/09/2014    Procedure: RIGHT AXILLARY LYMPH NODE DISECTION;  Surgeon: [name], MD;  Location: [facility];   Service: General;  Laterality: Right;  . Intramedullary (im) nail intertrochanteric Left 02/24/2014    Procedure: IM ROD LEFT HIP FX;  Surgeon: Alta Corning, MD;  Location: [facility];  Service: Orthopedics;  Laterality: Left;    There were no vitals filed for this visit.  Visit Diagnosis:  Lymphedema of upper extremity      Subjective Assessment - 05/30/14 1033    Symptoms My fingers look smaller. Dr Pablo Ledger told me we have to wait on the bandaging until after radiation.   Currently in Pain? No/denies               LYMPHEDEMA/ONCOLOGY QUESTIONNAIRE - 05/30/14 1035    Right Upper Extremity Lymphedema   10 cm Proximal to Olecranon Process 30.8 cm   Olecranon Process 26.4 cm   15 cm Proximal to Ulnar Styloid Process 26.7 cm   10 cm Proximal to Ulnar Styloid Process 24.6 cm   Just Proximal to Ulnar Styloid Process 19.7 cm   Across Hand at [employer] 20.1 cm   At [place] of 2nd Digit 7.2 cm                [facility] Adult PT Treatment/Exercise - 05/30/14 0001    Manual Therapy   Manual Lymphatic Drainage (MLD) In supine, short neck, left axilla and anterior interaxillary  anastomosis, right groin and axillo-inguinal anastomosis, and right UE from fingers to shoulder.   Compression Bandaging Lotion applied to right UE.  Bandaging with Elastomull to all 5 fingers and TG soft to right hand and arm to shoulder. Will need to hold off on bandaging until pt completes XRT.                   Short Term Clinic Goals - 05/23/14 1547    CC Short Term Goal  #1   Title Pt. will be knowledgeable about lymphedema risk reduction.   Time 3   Period Weeks   Status New   CC Short Term Goal  #2   Title Right arm circumference at 10 cm. proximal to ulnar styloid reduced by at least 1 cm.   Baseline 24.4 cm.   Time 3   Period Weeks   Status New             Long Term Clinic Goals - 05/23/14 1549    CC Long Term Goal  #1   Title Circumference at 10 cm. proximal to  right ulnar styloid reduced by 2 cm.   Baseline 24.4 cm.   Time 6   Period Weeks   Status New   CC Long Term Goal  #2   Title Will be knowledgeable about where/how to obtain compression sleeve and glove; also about other equipment available to help manage swelling.   Time 6   Period Weeks   Status New   CC Long Term Goal  #3   Title Will be knowledgeable about self-care for lymphedema, including manual lymph drainage.   Baseline 6   Period Weeks            Plan - 05/30/14 1159    Clinical Impression Statement Pt running late from raditation treatment today. PT heard back from Dr. Pablo Ledger and bandaging does need to hold off until after radiation treatments are complete. One of pts granddaughters were present for treatment today but not the one that lives with her and will be taking care of her. Began instructing pt today in manual lymph drainage while performing and she asked appropriate questions throughout. Circumference measurements rechecked today and pt did already have some reductions just from wearing the TG soft stockinette.    Pt will benefit from skilled therapeutic intervention in order to improve on the following deficits Increased edema;Decreased knowledge of precautions   Rehab Potential Excellent   Clinical Impairments Affecting Rehab Potential Currently receiving radiation therapy.   PT Frequency 3x / week   PT Duration 6 weeks   PT Treatment/Interventions Manual lymph drainage;Compression bandaging;Patient/family education;DME Instruction   PT Next Visit Plan Continue  manual lymph drainage and instruction, issue handouts for this. And per PT request, instructed pt okay to decrease frequency to 2x/wk as bandaging can not start yet.    Consulted and Agree with Plan of Care Patient;Family member/caregiver        Problem List Patient Active Problem List   Diagnosis Date Noted  . Acute blood loss anemia 02/25/2014  . Closed left hip fracture 02/24/2014  . Breast  cancer of upper-outer quadrant of left female breast 02/14/2014  . PAF (paroxysmal atrial fibrillation) 01/30/2014  . Anticoagulated 01/30/2014  . Chronic diastolic CHF (congestive heart failure), NYHA class 2 12/10/2013  . Preoperative cardiovascular examination 12/10/2013  . Neutropenic fever 12/08/2013  . Anemia of chronic disease 12/08/2013  . Thrombocytopenia 12/08/2013  . Hypokalemia 12/08/2013  . Pancytopenia due to  antineoplastic chemotherapy 12/08/2013  . Hypothyroidism 12/08/2013  . Breast cancer of upper-outer quadrant of right female breast 11/01/2013  . Essential hypertension, benign 12/01/2012  . Pure hypercholesterolemia 12/01/2012  . DM2 (diabetes mellitus, type 2) 11/21/2012    Otelia Limes, PTA 05/30/2014, 12:04 PM  [place], [place], [address] Phone: [phone]   Fax:  [phone]

## 2014-05-31 ENCOUNTER — Ambulatory Visit
Admission: RE | Admit: 2014-05-31 | Discharge: 2014-05-31 | Disposition: A | Discharge status: Home / Self Care | Payer: Medicare Other | Source: Ambulatory Visit | Attending: Radiation Oncology | Admitting: Radiation Oncology

## 2014-05-31 DIAGNOSIS — C50412 Malignant neoplasm of upper-outer quadrant of left female breast: Secondary | ICD-10-CM | POA: Diagnosis not present

## 2014-05-31 DIAGNOSIS — L599 Disorder of the skin and subcutaneous tissue related to radiation, unspecified: Secondary | ICD-10-CM | POA: Diagnosis not present

## 2014-05-31 DIAGNOSIS — I890 Lymphedema, not elsewhere classified: Secondary | ICD-10-CM | POA: Diagnosis not present

## 2014-05-31 DIAGNOSIS — Z510 Encounter for antineoplastic radiation therapy: Secondary | ICD-10-CM | POA: Diagnosis not present

## 2014-05-31 DIAGNOSIS — C50411 Malignant neoplasm of upper-outer quadrant of right female breast: Secondary | ICD-10-CM | POA: Diagnosis not present

## 2014-06-01 ENCOUNTER — Ambulatory Visit
Admission: RE | Admit: 2014-06-01 | Discharge: 2014-06-01 | Disposition: A | Discharge status: Home / Self Care | Payer: Medicare Other | Source: Ambulatory Visit | Attending: Radiation Oncology | Admitting: Radiation Oncology

## 2014-06-01 DIAGNOSIS — L599 Disorder of the skin and subcutaneous tissue related to radiation, unspecified: Secondary | ICD-10-CM | POA: Diagnosis not present

## 2014-06-01 DIAGNOSIS — Z510 Encounter for antineoplastic radiation therapy: Secondary | ICD-10-CM | POA: Diagnosis not present

## 2014-06-01 DIAGNOSIS — C50411 Malignant neoplasm of upper-outer quadrant of right female breast: Secondary | ICD-10-CM | POA: Diagnosis not present

## 2014-06-01 DIAGNOSIS — C50412 Malignant neoplasm of upper-outer quadrant of left female breast: Secondary | ICD-10-CM | POA: Diagnosis not present

## 2014-06-01 DIAGNOSIS — I890 Lymphedema, not elsewhere classified: Secondary | ICD-10-CM | POA: Diagnosis not present

## 2014-06-02 ENCOUNTER — Other Ambulatory Visit: Payer: Self-pay | Admitting: Endocrinology

## 2014-06-04 ENCOUNTER — Ambulatory Visit: Payer: Medicare Other

## 2014-06-04 ENCOUNTER — Ambulatory Visit
Admission: RE | Admit: 2014-06-04 | Discharge: 2014-06-04 | Disposition: A | Discharge status: Home / Self Care | Payer: Medicare Other | Source: Ambulatory Visit | Attending: Radiation Oncology | Admitting: Radiation Oncology

## 2014-06-04 DIAGNOSIS — L599 Disorder of the skin and subcutaneous tissue related to radiation, unspecified: Secondary | ICD-10-CM | POA: Diagnosis not present

## 2014-06-04 DIAGNOSIS — C50412 Malignant neoplasm of upper-outer quadrant of left female breast: Secondary | ICD-10-CM | POA: Diagnosis not present

## 2014-06-04 DIAGNOSIS — I890 Lymphedema, not elsewhere classified: Secondary | ICD-10-CM

## 2014-06-04 DIAGNOSIS — C50411 Malignant neoplasm of upper-outer quadrant of right female breast: Secondary | ICD-10-CM | POA: Diagnosis not present

## 2014-06-04 DIAGNOSIS — Z510 Encounter for antineoplastic radiation therapy: Secondary | ICD-10-CM | POA: Diagnosis not present

## 2014-06-04 NOTE — Therapy (Signed)
[name], [place], [address] Phone: [phone]   Fax:  [phone]  Physical Therapy Treatment  Patient Details  Name: Whitney [name] MRN: [id number] Date of Birth: 06-May-1927 Referring Provider:  [name], MD  Encounter Date: 06/04/2014      PT End of Session - 06/04/14 1033    Visit Number 4   Number of Visits 19   Date for PT Re-Evaluation 07/13/14   PT Start Time 1025   PT Stop Time 1105   PT Time Calculation (min) 40 min      Past Medical History  Diagnosis Date  . Hypertension   . Hyperlipidemia   . Diabetes mellitus without complication   . Thyroid disease     hypothyroidism  . Anemia   . Former smoker   . Multinodular goiter   . Complication of anesthesia     slow to wake up  . Cancer     right breast  . Wears glasses   . Full dentures   . Dysrhythmia 10/15    AF  . Breast cancer 10/2013    right upper outer  . Atrial fibrillation     Past Surgical History  Procedure Laterality Date  . Abdominal hysterectomy    . Portacath placement N/A 11/15/2013    Procedure: INSERTION PORT-A-CATH WITH ULTRA SOUND AND FLOROSCOPY;  Surgeon: [name], MD;  Location: [facility];  Service: General;  Laterality: N/A;  . Eye surgery  12/22/2009    cataracts  . Total knee arthroplasty  2002    left  . Port-a-cath removal Right 01/09/2014    Procedure: REMOVAL PORT-A-CATH;  Surgeon: [name], MD;  Location: [facility];  Service: General;  Laterality: Right;  . Breast lumpectomy with radioactive seed localization Right 01/09/2014    Procedure: RIGHT BREAST SEED LOCALIZED LUMPECTOMY ;  Surgeon: [name], MD;  Location: [facility];  Service: General;  Laterality: Right;  . Axillary lymph node dissection Right 01/09/2014    Procedure: RIGHT AXILLARY LYMPH NODE DISECTION;  Surgeon: [name], MD;  Location: [facility];   Service: General;  Laterality: Right;  . Intramedullary (im) nail intertrochanteric Left 02/24/2014    Procedure: IM ROD LEFT HIP FX;  Surgeon: Alta Corning, MD;  Location: [facility];  Service: Orthopedics;  Laterality: Left;    There were no vitals filed for this visit.  Visit Diagnosis:  Lymphedema of upper extremity      Subjective Assessment - 06/04/14 1028    Symptoms My fingers are doing fine being wrapped, have no problems with that. Left the sleeve on all the time too.                        [facility] Adult PT Treatment/Exercise - 06/04/14 0001    Manual Therapy   Manual Lymphatic Drainage (MLD) In supine, short neck, left axilla and anterior interaxillary anastomosis, right groin and axillo-inguinal anastomosis, and right UE from fingers to shoulder.   Compression Bandaging Bandaging with Elastomull to all 5 fingers and TG soft to right hand and arm to shoulder.                   Short Term Clinic Goals - 05/23/14 1547    CC Short Term Goal  #1   Title Pt. will be knowledgeable about lymphedema risk reduction.   Time 3   Period Weeks  Status New   CC Short Term Goal  #2   Title Right arm circumference at 10 cm. proximal to ulnar styloid reduced by at least 1 cm.   Baseline 24.4 cm.   Time 3   Period Weeks   Status New             Long Term Clinic Goals - 05/23/14 1549    CC Long Term Goal  #1   Title Circumference at 10 cm. proximal to right ulnar styloid reduced by 2 cm.   Baseline 24.4 cm.   Time 6   Period Weeks   Status New   CC Long Term Goal  #2   Title Will be knowledgeable about where/how to obtain compression sleeve and glove; also about other equipment available to help manage swelling.   Time 6   Period Weeks   Status New   CC Long Term Goal  #3   Title Will be knowledgeable about self-care for lymphedema, including manual lymph drainage.   Baseline 6   Period Weeks            Plan - 06/04/14 1031    Clinical  Impression Statement Pt running late from radiation treatment. Tolerating elastomull on figners fine, no redness at finger webspaces.    Pt will benefit from skilled therapeutic intervention in order to improve on the following deficits Increased edema;Decreased knowledge of precautions   Rehab Potential Excellent   Clinical Impairments Affecting Rehab Potential Currently receiving radiation therapy.   PT Frequency 3x / week   PT Duration 6 weeks   PT Treatment/Interventions Manual lymph drainage;Compression bandaging;Patient/family education;DME Instruction   PT Next Visit Plan Continue  manual lymph drainage and instruction, issue handouts for this. Remeasure next visit. Pt reports may want to have her fingers left unwrapped next time.   Consulted and Agree with Plan of Care Patient        Problem List Patient Active Problem List   Diagnosis Date Noted  . Acute blood loss anemia 02/25/2014  . Closed left hip fracture 02/24/2014  . Breast cancer of upper-outer quadrant of left female breast 02/14/2014  . PAF (paroxysmal atrial fibrillation) 01/30/2014  . Anticoagulated 01/30/2014  . Chronic diastolic CHF (congestive heart failure), NYHA class 2 12/10/2013  . Preoperative cardiovascular examination 12/10/2013  . Neutropenic fever 12/08/2013  . Anemia of chronic disease 12/08/2013  . Thrombocytopenia 12/08/2013  . Hypokalemia 12/08/2013  . Pancytopenia due to antineoplastic chemotherapy 12/08/2013  . Hypothyroidism 12/08/2013  . Breast cancer of upper-outer quadrant of right female breast 11/01/2013  . Essential hypertension, benign 12/01/2012  . Pure hypercholesterolemia 12/01/2012  . DM2 (diabetes mellitus, type 2) 11/21/2012    Otelia Limes, PTA 06/04/2014, 11:07 AM  [place], [place], [address] Phone: [phone]   Fax:  [phone]

## 2014-06-05 ENCOUNTER — Ambulatory Visit
Admission: RE | Admit: 2014-06-05 | Discharge: 2014-06-05 | Disposition: A | Discharge status: Home / Self Care | Payer: Medicare Other | Source: Ambulatory Visit | Attending: Radiation Oncology | Admitting: Radiation Oncology

## 2014-06-05 VITALS — BP 130/72 | HR 54 | Temp 98.2°F | Wt 189.2 lb

## 2014-06-05 DIAGNOSIS — I890 Lymphedema, not elsewhere classified: Secondary | ICD-10-CM | POA: Diagnosis not present

## 2014-06-05 DIAGNOSIS — C50411 Malignant neoplasm of upper-outer quadrant of right female breast: Secondary | ICD-10-CM | POA: Insufficient documentation

## 2014-06-05 DIAGNOSIS — Z510 Encounter for antineoplastic radiation therapy: Secondary | ICD-10-CM | POA: Diagnosis not present

## 2014-06-05 DIAGNOSIS — C50412 Malignant neoplasm of upper-outer quadrant of left female breast: Secondary | ICD-10-CM | POA: Insufficient documentation

## 2014-06-05 DIAGNOSIS — L599 Disorder of the skin and subcutaneous tissue related to radiation, unspecified: Secondary | ICD-10-CM | POA: Diagnosis not present

## 2014-06-05 MED ORDER — RADIAPLEXRX EX GEL
Freq: Once | CUTANEOUS | Status: AC
  Administered 2014-06-05: 17:00:00 via TOPICAL

## 2014-06-05 NOTE — Progress Notes (Signed)
Weekly Management Note Current Dose:  30.6 Gy  Projected Dose: 61 Gy   Narrative:  The patient presents for routine under treatment assessment.  CBCT/MVCT images/Port film x-rays were reviewed.  The chart was checked. Doing ok. PT helping arm. Less pain.   Physical Findings: Weight: 189 lb 3.2 oz (85.821 kg). Mild hyperpigmentation over right breast. Early dry desquamation in inframammary fold.   Impression:  The patient is tolerating radiation.  Plan:  Continue treatment as planned. Continue radiaplex. Keep area under breast dry.

## 2014-06-05 NOTE — Addendum Note (Signed)
Encounter addended by: Norm Salt, RN on: 06/05/2014  5:02 PM<BR>     Documentation filed: Inpatient MAR

## 2014-06-05 NOTE — Progress Notes (Signed)
Weekly assessment of radiation to right [email or web address] tanning of breast with increased red marking of mammary fold.Given another tube of radiaplex to apply twice daily.No pain or increased fatigue.Appetite good and has normal daily bowel movements.Continue right arm exercises for lymphedema.

## 2014-06-05 NOTE — Addendum Note (Signed)
Encounter addended by: Norm Salt, RN on: 06/05/2014  4:54 PM<BR>     Documentation filed: Visit Diagnoses, Dx Association, Orders

## 2014-06-06 ENCOUNTER — Ambulatory Visit
Admission: RE | Admit: 2014-06-06 | Discharge: 2014-06-06 | Disposition: A | Discharge status: Home / Self Care | Payer: Medicare Other | Source: Ambulatory Visit | Attending: Radiation Oncology | Admitting: Radiation Oncology

## 2014-06-06 ENCOUNTER — Ambulatory Visit: Payer: Medicare Other

## 2014-06-06 DIAGNOSIS — I890 Lymphedema, not elsewhere classified: Secondary | ICD-10-CM | POA: Diagnosis not present

## 2014-06-06 DIAGNOSIS — C50411 Malignant neoplasm of upper-outer quadrant of right female breast: Secondary | ICD-10-CM | POA: Diagnosis not present

## 2014-06-06 DIAGNOSIS — L599 Disorder of the skin and subcutaneous tissue related to radiation, unspecified: Secondary | ICD-10-CM | POA: Diagnosis not present

## 2014-06-06 DIAGNOSIS — C50412 Malignant neoplasm of upper-outer quadrant of left female breast: Secondary | ICD-10-CM | POA: Diagnosis not present

## 2014-06-06 DIAGNOSIS — Z510 Encounter for antineoplastic radiation therapy: Secondary | ICD-10-CM | POA: Diagnosis not present

## 2014-06-06 NOTE — Patient Instructions (Signed)

## 2014-06-06 NOTE — Therapy (Signed)
[place], [place], [address] Phone: [phone]   Fax:  [phone]  Physical Therapy Treatment  Patient Details  Name: Whitney [name] MRN: [id number] Date of Birth: January 31, 1928 Referring Provider:  [name], MD  Encounter Date: 06/06/2014      PT End of Session - 06/06/14 1106    Visit Number 5   Number of Visits 19   Date for PT Re-Evaluation 07/13/14   PT Start Time 1018   PT Stop Time 1105   PT Time Calculation (min) 47 min      Past Medical History  Diagnosis Date  . Hypertension   . Hyperlipidemia   . Diabetes mellitus without complication   . Thyroid disease     hypothyroidism  . Anemia   . Former smoker   . Multinodular goiter   . Complication of anesthesia     slow to wake up  . Cancer     right breast  . Wears glasses   . Full dentures   . Dysrhythmia 10/15    AF  . Breast cancer 10/2013    right upper outer  . Atrial fibrillation     Past Surgical History  Procedure Laterality Date  . Abdominal hysterectomy    . Portacath placement N/A 11/15/2013    Procedure: INSERTION PORT-A-CATH WITH ULTRA SOUND AND FLOROSCOPY;  Surgeon: [name], MD;  Location: [facility];  Service: General;  Laterality: N/A;  . Eye surgery  12/22/2009    cataracts  . Total knee arthroplasty  2002    left  . Port-a-cath removal Right 01/09/2014    Procedure: REMOVAL PORT-A-CATH;  Surgeon: [name], MD;  Location: [facility];  Service: General;  Laterality: Right;  . Breast lumpectomy with radioactive seed localization Right 01/09/2014    Procedure: RIGHT BREAST SEED LOCALIZED LUMPECTOMY ;  Surgeon: [name], MD;  Location: [facility];  Service: General;  Laterality: Right;  . Axillary lymph node dissection Right 01/09/2014    Procedure: RIGHT AXILLARY LYMPH NODE DISECTION;  Surgeon: [name], MD;  Location: [facility];   Service: General;  Laterality: Right;  . Intramedullary (im) nail intertrochanteric Left 02/24/2014    Procedure: IM ROD LEFT HIP FX;  Surgeon: Alta Corning, MD;  Location: [facility];  Service: Orthopedics;  Laterality: Left;    There were no vitals filed for this visit.  Visit Diagnosis:  Lymphedema of upper extremity      Subjective Assessment - 06/06/14 1021    Symptoms Feeling good, not having any pain.                LYMPHEDEMA/ONCOLOGY QUESTIONNAIRE - 06/06/14 1022    Right Upper Extremity Lymphedema   10 cm Proximal to Olecranon Process 30.8 cm   Olecranon Process 25.6 cm   15 cm Proximal to Ulnar Styloid Process 26.4 cm   10 cm Proximal to Ulnar Styloid Process 24.2 cm   Just Proximal to Ulnar Styloid Process 20.1 cm   Across Hand at [employer] 19.5 cm   At [place] of 2nd Digit 6.8 cm                [facility] Adult PT Treatment/Exercise - 06/06/14 0001    Manual Therapy   Manual Lymphatic Drainage (MLD) In supine, short neck, left axilla and anterior interaxillary anastomosis, right groin and axillo-inguinal anastomosis, and right UE from fingers to shoulder instructing pts daughter throughout  treatment today and having her practice hand techniques on uninvolved UE with pt providing feedback regarding pressure.    Compression Bandaging Donned TG soft sleeve after treatment. Did not apply Elastomull to fingers today so as pt could take a shower.                 PT Education - 06/06/14 1049    Education provided Yes   Education Details Instructed pts daughter in manual lymph drainage.    Person(s) Educated Patient;Child(ren)   Methods Explanation;Demonstration;Handout   Comprehension Verbalized understanding;Returned demonstration;Need further instruction           Short Term Clinic Goals - 06/06/14 1207    CC Short Term Goal  #1   Title Pt. will be knowledgeable about lymphedema risk reduction.   Status On-going   CC Short Term Goal  #2    Title Right arm circumference at 10 cm. proximal to ulnar styloid reduced by at least 1 cm.  0.2 cm reduction with just wearing TG soft sleeve.    Status On-going             Long Term Clinic Goals - 05/23/14 1549    CC Long Term Goal  #1   Title Circumference at 10 cm. proximal to right ulnar styloid reduced by 2 cm.   Baseline 24.4 cm.   Time 6   Period Weeks   Status New   CC Long Term Goal  #2   Title Will be knowledgeable about where/how to obtain compression sleeve and glove; also about other equipment available to help manage swelling.   Time 6   Period Weeks   Status New   CC Long Term Goal  #3   Title Will be knowledgeable about self-care for lymphedema, including manual lymph drainage.   Baseline 6   Period Weeks            Plan - 06/06/14 1159    Clinical Impression Statement Rechecked pts circumference measurements and all but her wrist measurements were slightly reduced from wearing the TG soft sleeve on arm and Elastomull on her fingers which she wanted to leave off today so as she could take a shower. Pts daughter was present for treatment  today to learn manual lymph drainage and handouts issued.    Pt will benefit from skilled therapeutic intervention in order to improve on the following deficits Increased edema;Decreased knowledge of precautions   Rehab Potential Excellent   Clinical Impairments Affecting Rehab Potential Currently receiving radiation therapy.   PT Frequency 3x / week   PT Duration 6 weeks   PT Treatment/Interventions Manual lymph drainage;Compression bandaging;Patient/family education;DME Instruction   PT Next Visit Plan Continue  manual lymph drainage and instruct granddaugther that lives with her. Serafina Royals, PT reports okay to put pt on hold, once we finishing instructing family members on manual lymph drainage, until she finishes radiation and we can begin compression bandaging.    Consulted and Agree with Plan of Care Patient         Problem List Patient Active Problem List   Diagnosis Date Noted  . Acute blood loss anemia 02/25/2014  . Closed left hip fracture 02/24/2014  . Breast cancer of upper-outer quadrant of left female breast 02/14/2014  . PAF (paroxysmal atrial fibrillation) 01/30/2014  . Anticoagulated 01/30/2014  . Chronic diastolic CHF (congestive heart failure), NYHA class 2 12/10/2013  . Preoperative cardiovascular examination 12/10/2013  . Neutropenic fever 12/08/2013  . Anemia of chronic disease 12/08/2013  .  Thrombocytopenia 12/08/2013  . Hypokalemia 12/08/2013  . Pancytopenia due to antineoplastic chemotherapy 12/08/2013  . Hypothyroidism 12/08/2013  . Breast cancer of upper-outer quadrant of right female breast 11/01/2013  . Essential hypertension, benign 12/01/2012  . Pure hypercholesterolemia 12/01/2012  . DM2 (diabetes mellitus, type 2) 11/21/2012    Otelia Limes, PTA 06/06/2014, 12:12 PM  [place], [place], [address] Phone: [phone]   Fax:  [phone]

## 2014-06-07 ENCOUNTER — Ambulatory Visit
Admission: RE | Admit: 2014-06-07 | Discharge: 2014-06-07 | Disposition: A | Discharge status: Home / Self Care | Payer: Medicare Other | Source: Ambulatory Visit | Attending: Radiation Oncology | Admitting: Radiation Oncology

## 2014-06-07 DIAGNOSIS — Z510 Encounter for antineoplastic radiation therapy: Secondary | ICD-10-CM | POA: Diagnosis not present

## 2014-06-07 DIAGNOSIS — I890 Lymphedema, not elsewhere classified: Secondary | ICD-10-CM | POA: Diagnosis not present

## 2014-06-07 DIAGNOSIS — C50411 Malignant neoplasm of upper-outer quadrant of right female breast: Secondary | ICD-10-CM | POA: Diagnosis not present

## 2014-06-07 DIAGNOSIS — L599 Disorder of the skin and subcutaneous tissue related to radiation, unspecified: Secondary | ICD-10-CM | POA: Diagnosis not present

## 2014-06-07 DIAGNOSIS — C50412 Malignant neoplasm of upper-outer quadrant of left female breast: Secondary | ICD-10-CM | POA: Diagnosis not present

## 2014-06-08 ENCOUNTER — Ambulatory Visit
Admission: RE | Admit: 2014-06-08 | Discharge: 2014-06-08 | Disposition: A | Discharge status: Home / Self Care | Payer: Medicare Other | Source: Ambulatory Visit | Attending: Radiation Oncology | Admitting: Radiation Oncology

## 2014-06-08 DIAGNOSIS — I890 Lymphedema, not elsewhere classified: Secondary | ICD-10-CM | POA: Diagnosis not present

## 2014-06-08 DIAGNOSIS — C50411 Malignant neoplasm of upper-outer quadrant of right female breast: Secondary | ICD-10-CM | POA: Diagnosis not present

## 2014-06-08 DIAGNOSIS — L599 Disorder of the skin and subcutaneous tissue related to radiation, unspecified: Secondary | ICD-10-CM | POA: Diagnosis not present

## 2014-06-08 DIAGNOSIS — Z510 Encounter for antineoplastic radiation therapy: Secondary | ICD-10-CM | POA: Diagnosis not present

## 2014-06-08 DIAGNOSIS — C50412 Malignant neoplasm of upper-outer quadrant of left female breast: Secondary | ICD-10-CM | POA: Diagnosis not present

## 2014-06-11 ENCOUNTER — Ambulatory Visit: Payer: Medicare Other

## 2014-06-11 ENCOUNTER — Ambulatory Visit
Admission: RE | Admit: 2014-06-11 | Discharge: 2014-06-11 | Disposition: A | Discharge status: Home / Self Care | Payer: Medicare Other | Source: Ambulatory Visit | Attending: Radiation Oncology | Admitting: Radiation Oncology

## 2014-06-11 DIAGNOSIS — C50412 Malignant neoplasm of upper-outer quadrant of left female breast: Secondary | ICD-10-CM | POA: Diagnosis not present

## 2014-06-11 DIAGNOSIS — Z510 Encounter for antineoplastic radiation therapy: Secondary | ICD-10-CM | POA: Diagnosis not present

## 2014-06-11 DIAGNOSIS — L599 Disorder of the skin and subcutaneous tissue related to radiation, unspecified: Secondary | ICD-10-CM | POA: Diagnosis not present

## 2014-06-11 DIAGNOSIS — I890 Lymphedema, not elsewhere classified: Secondary | ICD-10-CM

## 2014-06-11 DIAGNOSIS — C50411 Malignant neoplasm of upper-outer quadrant of right female breast: Secondary | ICD-10-CM | POA: Diagnosis not present

## 2014-06-11 NOTE — Therapy (Signed)
[place], [place], [address] Phone: [phone]   Fax:  [phone]  Physical Therapy Treatment  Patient Details  Name: Whitney [name] MRN: [id number] Date of Birth: 03/13/1928 Referring Provider:  [name], MD  Encounter Date: 06/11/2014      PT End of Session - 06/11/14 1107    Visit Number 6   Number of Visits 19   Date for PT Re-Evaluation 07/13/14   PT Start Time 1024   PT Stop Time 1106   PT Time Calculation (min) 42 min      Past Medical History  Diagnosis Date  . Hypertension   . Hyperlipidemia   . Diabetes mellitus without complication   . Thyroid disease     hypothyroidism  . Anemia   . Former smoker   . Multinodular goiter   . Complication of anesthesia     slow to wake up  . Cancer     right breast  . Wears glasses   . Full dentures   . Dysrhythmia 10/15    AF  . Breast cancer 10/2013    right upper outer  . Atrial fibrillation     Past Surgical History  Procedure Laterality Date  . Abdominal hysterectomy    . Portacath placement N/A 11/15/2013    Procedure: INSERTION PORT-A-CATH WITH ULTRA SOUND AND FLOROSCOPY;  Surgeon: [name], MD;  Location: [facility];  Service: General;  Laterality: N/A;  . Eye surgery  12/22/2009    cataracts  . Total knee arthroplasty  2002    left  . Port-a-cath removal Right 01/09/2014    Procedure: REMOVAL PORT-A-CATH;  Surgeon: [name], MD;  Location: [facility];  Service: General;  Laterality: Right;  . Breast lumpectomy with radioactive seed localization Right 01/09/2014    Procedure: RIGHT BREAST SEED LOCALIZED LUMPECTOMY ;  Surgeon: [name], MD;  Location: [facility];  Service: General;  Laterality: Right;  . Axillary lymph node dissection Right 01/09/2014    Procedure: RIGHT AXILLARY LYMPH NODE DISECTION;  Surgeon: [name], MD;  Location: [facility];   Service: General;  Laterality: Right;  . Intramedullary (im) nail intertrochanteric Left 02/24/2014    Procedure: IM ROD LEFT HIP FX;  Surgeon: Alta Corning, MD;  Location: [facility];  Service: Orthopedics;  Laterality: Left;    There were no vitals filed for this visit.  Visit Diagnosis:  No diagnosis found.      Subjective Assessment - 06/11/14 1033    Symptoms Radiation going okay, getting a little red spot under my breast where my bra rubs.                       [facility] Adult PT Treatment/Exercise - 06/11/14 0001    Manual Therapy   Manual Lymphatic Drainage (MLD) In supine, short neck, left axilla and anterior interaxillary anastomosis, right groin and axillo-inguinal anastomosis, and right UE from fingers to shoulder.                   Short Term Clinic Goals - 06/06/14 1207    CC Short Term Goal  #1   Title Pt. will be knowledgeable about lymphedema risk reduction.   Status On-going   CC Short Term Goal  #2   Title Right arm circumference at 10 cm. proximal to ulnar styloid reduced by at least 1 cm.  0.2 cm reduction with just wearing  TG soft sleeve.    Status On-going             Long Term Clinic Goals - 05/23/14 1549    CC Long Term Goal  #1   Title Circumference at 10 cm. proximal to right ulnar styloid reduced by 2 cm.   Baseline 24.4 cm.   Time 6   Period Weeks   Status New   CC Long Term Goal  #2   Title Will be knowledgeable about where/how to obtain compression sleeve and glove; also about other equipment available to help manage swelling.   Time 6   Period Weeks   Status New   CC Long Term Goal  #3   Title Will be knowledgeable about self-care for lymphedema, including manual lymph drainage.   Baseline 6   Period Weeks            Plan - 06/11/14 1107    Clinical Impression Statement Pts circumferenceis maintaining thus far with pt wearing TG soft most hours of the day. Spoke with granddaughter Caryl Comes that lives with pt  today (she doesnt want to learn the massage) and informed her and the pt that Serafina Royals, PT is okay with pt being on hold until after radiation when we can bandage her as her fluid has been holding steady. Will retrain daugther Jolette next week that I instructed last week on manual lymph drainage as she has some questions.    Pt will benefit from skilled therapeutic intervention in order to improve on the following deficits Increased edema;Decreased knowledge of precautions   Rehab Potential Excellent   Clinical Impairments Affecting Rehab Potential Currently receiving radiation therapy.   PT Frequency 3x / week   PT Duration 6 weeks   PT Treatment/Interventions Manual lymph drainage;Compression bandaging;Patient/family education;DME Instruction   PT Next Visit Plan Review with  daughter manual lymph drainage having her perform it at next visit then on hold until raditaion ends and compression bandaging can begin.     Consulted and Agree with Plan of Care Patient        Problem List Patient Active Problem List   Diagnosis Date Noted  . Acute blood loss anemia 02/25/2014  . Closed left hip fracture 02/24/2014  . Breast cancer of upper-outer quadrant of left female breast 02/14/2014  . PAF (paroxysmal atrial fibrillation) 01/30/2014  . Anticoagulated 01/30/2014  . Chronic diastolic CHF (congestive heart failure), NYHA class 2 12/10/2013  . Preoperative cardiovascular examination 12/10/2013  . Neutropenic fever 12/08/2013  . Anemia of chronic disease 12/08/2013  . Thrombocytopenia 12/08/2013  . Hypokalemia 12/08/2013  . Pancytopenia due to antineoplastic chemotherapy 12/08/2013  . Hypothyroidism 12/08/2013  . Breast cancer of upper-outer quadrant of right female breast 11/01/2013  . Essential hypertension, benign 12/01/2012  . Pure hypercholesterolemia 12/01/2012  . DM2 (diabetes mellitus, type 2) 11/21/2012    Otelia Limes , PTA  06/11/2014, 11:20 AM  [place], [place], [address] Phone: [phone]   Fax:  [phone]

## 2014-06-12 ENCOUNTER — Ambulatory Visit
Admission: RE | Admit: 2014-06-12 | Discharge: 2014-06-12 | Disposition: A | Discharge status: Home / Self Care | Payer: Medicare Other | Source: Ambulatory Visit | Attending: Radiation Oncology | Admitting: Radiation Oncology

## 2014-06-12 DIAGNOSIS — Z510 Encounter for antineoplastic radiation therapy: Secondary | ICD-10-CM | POA: Diagnosis not present

## 2014-06-12 DIAGNOSIS — L599 Disorder of the skin and subcutaneous tissue related to radiation, unspecified: Secondary | ICD-10-CM | POA: Diagnosis not present

## 2014-06-12 DIAGNOSIS — C50412 Malignant neoplasm of upper-outer quadrant of left female breast: Secondary | ICD-10-CM | POA: Diagnosis not present

## 2014-06-12 DIAGNOSIS — I890 Lymphedema, not elsewhere classified: Secondary | ICD-10-CM | POA: Diagnosis not present

## 2014-06-12 DIAGNOSIS — C50411 Malignant neoplasm of upper-outer quadrant of right female breast: Secondary | ICD-10-CM | POA: Diagnosis not present

## 2014-06-12 NOTE — Progress Notes (Signed)
Weekly Management Note Current Dose:  39.6 Gy  Projected Dose: 61 Gy   Narrative:  The patient presents for routine under treatment assessment.  CBCT/MVCT images/Port film x-rays were reviewed.  The chart was checked. Doing well. Saw on tx machine for boost markout.   Physical Findings: Weight:  . Unchanged. Dark in inframammary fold and axilla especially. Skin intact.   Impression:  The patient is tolerating radiation.  Plan:  Continue treatment as planned. Continue radiaplex.

## 2014-06-13 ENCOUNTER — Ambulatory Visit
Admission: RE | Admit: 2014-06-13 | Discharge: 2014-06-13 | Disposition: A | Discharge status: Home / Self Care | Payer: Medicare Other | Source: Ambulatory Visit | Attending: Radiation Oncology | Admitting: Radiation Oncology

## 2014-06-13 DIAGNOSIS — I890 Lymphedema, not elsewhere classified: Secondary | ICD-10-CM | POA: Diagnosis not present

## 2014-06-13 DIAGNOSIS — Z510 Encounter for antineoplastic radiation therapy: Secondary | ICD-10-CM | POA: Diagnosis not present

## 2014-06-13 DIAGNOSIS — C50412 Malignant neoplasm of upper-outer quadrant of left female breast: Secondary | ICD-10-CM | POA: Diagnosis not present

## 2014-06-13 DIAGNOSIS — C50411 Malignant neoplasm of upper-outer quadrant of right female breast: Secondary | ICD-10-CM | POA: Diagnosis not present

## 2014-06-13 DIAGNOSIS — L599 Disorder of the skin and subcutaneous tissue related to radiation, unspecified: Secondary | ICD-10-CM | POA: Diagnosis not present

## 2014-06-14 ENCOUNTER — Ambulatory Visit
Admission: RE | Admit: 2014-06-14 | Discharge: 2014-06-14 | Disposition: A | Discharge status: Home / Self Care | Payer: Medicare Other | Source: Ambulatory Visit | Attending: Radiation Oncology | Admitting: Radiation Oncology

## 2014-06-14 DIAGNOSIS — C50411 Malignant neoplasm of upper-outer quadrant of right female breast: Secondary | ICD-10-CM | POA: Diagnosis not present

## 2014-06-14 DIAGNOSIS — C50412 Malignant neoplasm of upper-outer quadrant of left female breast: Secondary | ICD-10-CM | POA: Diagnosis not present

## 2014-06-14 DIAGNOSIS — I890 Lymphedema, not elsewhere classified: Secondary | ICD-10-CM | POA: Diagnosis not present

## 2014-06-14 DIAGNOSIS — Z510 Encounter for antineoplastic radiation therapy: Secondary | ICD-10-CM | POA: Diagnosis not present

## 2014-06-14 DIAGNOSIS — L599 Disorder of the skin and subcutaneous tissue related to radiation, unspecified: Secondary | ICD-10-CM | POA: Diagnosis not present

## 2014-06-15 ENCOUNTER — Ambulatory Visit: Payer: Medicare Other

## 2014-06-15 ENCOUNTER — Ambulatory Visit: Admission: RE | Admit: 2014-06-15 | Payer: Medicare Other | Source: Ambulatory Visit

## 2014-06-18 ENCOUNTER — Ambulatory Visit
Admission: RE | Admit: 2014-06-18 | Discharge: 2014-06-18 | Disposition: A | Discharge status: Home / Self Care | Payer: Medicare Other | Source: Ambulatory Visit | Attending: Radiation Oncology | Admitting: Radiation Oncology

## 2014-06-18 ENCOUNTER — Ambulatory Visit: Payer: Medicare Other

## 2014-06-18 DIAGNOSIS — Z510 Encounter for antineoplastic radiation therapy: Secondary | ICD-10-CM | POA: Diagnosis not present

## 2014-06-18 DIAGNOSIS — C50412 Malignant neoplasm of upper-outer quadrant of left female breast: Secondary | ICD-10-CM | POA: Diagnosis not present

## 2014-06-18 DIAGNOSIS — L599 Disorder of the skin and subcutaneous tissue related to radiation, unspecified: Secondary | ICD-10-CM | POA: Diagnosis not present

## 2014-06-18 DIAGNOSIS — I890 Lymphedema, not elsewhere classified: Secondary | ICD-10-CM | POA: Diagnosis not present

## 2014-06-18 DIAGNOSIS — C50411 Malignant neoplasm of upper-outer quadrant of right female breast: Secondary | ICD-10-CM | POA: Diagnosis not present

## 2014-06-18 DIAGNOSIS — R269 Unspecified abnormalities of gait and mobility: Secondary | ICD-10-CM | POA: Diagnosis not present

## 2014-06-19 ENCOUNTER — Ambulatory Visit: Payer: Medicare Other

## 2014-06-19 ENCOUNTER — Ambulatory Visit
Admission: RE | Admit: 2014-06-19 | Discharge: 2014-06-19 | Disposition: A | Discharge status: Home / Self Care | Payer: Medicare Other | Source: Ambulatory Visit | Attending: Radiation Oncology | Admitting: Radiation Oncology

## 2014-06-19 VITALS — BP 136/57 | HR 53 | Temp 98.2°F | Wt 186.9 lb

## 2014-06-19 DIAGNOSIS — C50412 Malignant neoplasm of upper-outer quadrant of left female breast: Secondary | ICD-10-CM | POA: Diagnosis not present

## 2014-06-19 DIAGNOSIS — C50411 Malignant neoplasm of upper-outer quadrant of right female breast: Secondary | ICD-10-CM | POA: Diagnosis not present

## 2014-06-19 DIAGNOSIS — I890 Lymphedema, not elsewhere classified: Secondary | ICD-10-CM | POA: Diagnosis not present

## 2014-06-19 DIAGNOSIS — L599 Disorder of the skin and subcutaneous tissue related to radiation, unspecified: Secondary | ICD-10-CM | POA: Diagnosis not present

## 2014-06-19 DIAGNOSIS — Z510 Encounter for antineoplastic radiation therapy: Secondary | ICD-10-CM | POA: Diagnosis not present

## 2014-06-19 NOTE — Progress Notes (Signed)
Weekly assessment of radiation to right breast.Starte boost today has 7 more treatments.Skin hyperpigmented without peeling.Continue application of radiaplex.Denies pain or itching.

## 2014-06-19 NOTE — Progress Notes (Signed)
Weekly Management Note Current Dose: 47  Gy  Projected Dose: 61 Gy   Narrative:  The patient presents for routine under treatment assessment.  CBCT/MVCT images/Port film x-rays were reviewed.  The chart was checked. Doing well. No complaints. REady to be done.   Physical Findings: Weight: 186 lb 14.4 oz (84.777 kg). Unchanged. Slight hyperpigmentation and pink over right breast. No desquamation.   Impression:  The patient is tolerating radiation.  Plan:  Continue treatment as planned. Continue radiaplex.

## 2014-06-20 ENCOUNTER — Ambulatory Visit
Admission: RE | Admit: 2014-06-20 | Discharge: 2014-06-20 | Disposition: A | Discharge status: Home / Self Care | Payer: Medicare Other | Source: Ambulatory Visit | Attending: Radiation Oncology | Admitting: Radiation Oncology

## 2014-06-20 ENCOUNTER — Ambulatory Visit: Payer: Medicare Other | Attending: Radiation Oncology

## 2014-06-20 DIAGNOSIS — L599 Disorder of the skin and subcutaneous tissue related to radiation, unspecified: Secondary | ICD-10-CM | POA: Diagnosis not present

## 2014-06-20 DIAGNOSIS — I890 Lymphedema, not elsewhere classified: Secondary | ICD-10-CM | POA: Diagnosis not present

## 2014-06-20 DIAGNOSIS — C50411 Malignant neoplasm of upper-outer quadrant of right female breast: Secondary | ICD-10-CM | POA: Insufficient documentation

## 2014-06-20 DIAGNOSIS — Z510 Encounter for antineoplastic radiation therapy: Secondary | ICD-10-CM | POA: Diagnosis not present

## 2014-06-20 NOTE — Therapy (Signed)
[place], [place], [address] Phone: [phone]   Fax:  [phone]  Physical Therapy Treatment  Patient Details  Name: Whitney [name] MRN: [id number] Date of Birth: 1927-03-26 Referring Provider:  [name], MD  Encounter Date: 06/20/2014      PT End of Session - 06/20/14 1106    Visit Number 7   Number of Visits 19   Date for PT Re-Evaluation 07/13/14   PT Start Time 5400   PT Stop Time 1104   PT Time Calculation (min) 43 min      Past Medical History  Diagnosis Date  . Hypertension   . Hyperlipidemia   . Diabetes mellitus without complication   . Thyroid disease     hypothyroidism  . Anemia   . Former smoker   . Multinodular goiter   . Complication of anesthesia     slow to wake up  . Cancer     right breast  . Wears glasses   . Full dentures   . Dysrhythmia 10/15    AF  . Breast cancer 10/2013    right upper outer  . Atrial fibrillation     Past Surgical History  Procedure Laterality Date  . Abdominal hysterectomy    . Portacath placement N/A 11/15/2013    Procedure: INSERTION PORT-A-CATH WITH ULTRA SOUND AND FLOROSCOPY;  Surgeon: [name], MD;  Location: [facility];  Service: General;  Laterality: N/A;  . Eye surgery  12/22/2009    cataracts  . Total knee arthroplasty  2002    left  . Port-a-cath removal Right 01/09/2014    Procedure: REMOVAL PORT-A-CATH;  Surgeon: [name], MD;  Location: [facility];  Service: General;  Laterality: Right;  . Breast lumpectomy with radioactive seed localization Right 01/09/2014    Procedure: RIGHT BREAST SEED LOCALIZED LUMPECTOMY ;  Surgeon: [name], MD;  Location: [facility];  Service: General;  Laterality: Right;  . Axillary lymph node dissection Right 01/09/2014    Procedure: RIGHT AXILLARY LYMPH NODE DISECTION;  Surgeon: [name], MD;  Location: [facility];   Service: General;  Laterality: Right;  . Intramedullary (im) nail intertrochanteric Left 02/24/2014    Procedure: IM ROD LEFT HIP FX;  Surgeon: Alta Corning, MD;  Location: [facility];  Service: Orthopedics;  Laterality: Left;    There were no vitals filed for this visit.  Visit Diagnosis:  Lymphedema of upper extremity      Subjective Assessment - 06/20/14 1039    Subjective Have to do an extra radiation treament because the machine was down on friday. So I can start bandaging on April 14. Been wearing the TG soft sleeve but my hand/arm is swelling again.    Currently in Pain? No/denies                       [facility] Adult PT Treatment/Exercise - 06/20/14 0001    Manual Therapy   Manual Lymphatic Drainage (MLD) Had pts [name] perform first today to assess technique, then PTA performed again reviewing sequence throughout. In supine with HOB elevated, 5 Diaphragmatic breaths, left axilla and anterior interaxillary anastomosis, right groin and axillo-inguinal anastomosis, and right UE from fingers to shoulder.                PT Education - 06/20/14 1047    Education provided Yes   Education Details Manual lymph drainage  Person(s) Educated Patient;Child(ren)  Daughter Clyda Greener and granddaughter   Methods Explanation;Demonstration;Tactile cues   Comprehension Verbalized understanding;Returned demonstration;Verbal cues required           Short Term Clinic Goals - 06/06/14 1207    CC Short Term Goal  #1   Title Pt. will be knowledgeable about lymphedema risk reduction.   Status On-going   CC Short Term Goal  #2   Title Right arm circumference at 10 cm. proximal to ulnar styloid reduced by at least 1 cm.  0.2 cm reduction with just wearing TG soft sleeve.    Status On-going             Long Term Clinic Goals - 05/23/14 1549    CC Long Term Goal  #1   Title Circumference at 10 cm. proximal to right ulnar styloid reduced by 2 cm.   Baseline 24.4  cm.   Time 6   Period Weeks   Status New   CC Long Term Goal  #2   Title Will be knowledgeable about where/how to obtain compression sleeve and glove; also about other equipment available to help manage swelling.   Time 6   Period Weeks   Status New   CC Long Term Goal  #3   Title Will be knowledgeable about self-care for lymphedema, including manual lymph drainage.   Baseline 6   Period Weeks            Plan - 06/20/14 1106    Clinical Impression Statement Pts daughter present and had her perform manual lymph drainage on pt today with therapist providing feedback and encouragement throughout. Daughter Clyda Greener demonstrated excellent technique and understanding of sequence. She reports will be able to perform this for her mom a few times before we can begin bandaging on April 15.    Pt will benefit from skilled therapeutic intervention in order to improve on the following deficits Increased edema;Decreased knowledge of precautions   Rehab Potential Excellent   Clinical Impairments Affecting Rehab Potential Currently receiving radiation therapy.   PT Frequency 3x / week   PT Duration 6 weeks   PT Treatment/Interventions Manual lymph drainage;Compression bandaging;Patient/family education;DME Instruction   PT Next Visit Plan Daughter to perform manual lymph drainage on pt at home until we can begin bandaging. Begin complete decongestive therapy next visit.    Consulted and Agree with Plan of Care Patient        Problem List Patient Active Problem List   Diagnosis Date Noted  . Acute blood loss anemia 02/25/2014  . Closed left hip fracture 02/24/2014  . Breast cancer of upper-outer quadrant of left female breast 02/14/2014  . PAF (paroxysmal atrial fibrillation) 01/30/2014  . Anticoagulated 01/30/2014  . Chronic diastolic CHF (congestive heart failure), NYHA class 2 12/10/2013  . Preoperative cardiovascular examination 12/10/2013  . Neutropenic fever 12/08/2013  . Anemia of  chronic disease 12/08/2013  . Thrombocytopenia 12/08/2013  . Hypokalemia 12/08/2013  . Pancytopenia due to antineoplastic chemotherapy 12/08/2013  . Hypothyroidism 12/08/2013  . Breast cancer of upper-outer quadrant of right female breast 11/01/2013  . Essential hypertension, benign 12/01/2012  . Pure hypercholesterolemia 12/01/2012  . DM2 (diabetes mellitus, type 2) 11/21/2012    Otelia Limes, PTA 06/20/2014, 11:10 AM  [place], [place], [address] Phone: [phone]   Fax:  [phone]

## 2014-06-21 ENCOUNTER — Ambulatory Visit
Admission: RE | Admit: 2014-06-21 | Discharge: 2014-06-21 | Disposition: A | Discharge status: Home / Self Care | Payer: Medicare Other | Source: Ambulatory Visit | Attending: Radiation Oncology | Admitting: Radiation Oncology

## 2014-06-21 DIAGNOSIS — I890 Lymphedema, not elsewhere classified: Secondary | ICD-10-CM | POA: Diagnosis not present

## 2014-06-21 DIAGNOSIS — L599 Disorder of the skin and subcutaneous tissue related to radiation, unspecified: Secondary | ICD-10-CM | POA: Diagnosis not present

## 2014-06-21 DIAGNOSIS — Z510 Encounter for antineoplastic radiation therapy: Secondary | ICD-10-CM | POA: Diagnosis not present

## 2014-06-21 DIAGNOSIS — C50411 Malignant neoplasm of upper-outer quadrant of right female breast: Secondary | ICD-10-CM | POA: Diagnosis not present

## 2014-06-22 ENCOUNTER — Ambulatory Visit
Admission: RE | Admit: 2014-06-22 | Discharge: 2014-06-22 | Disposition: A | Discharge status: Home / Self Care | Payer: Medicare Other | Source: Ambulatory Visit | Attending: Radiation Oncology | Admitting: Radiation Oncology

## 2014-06-22 DIAGNOSIS — C50411 Malignant neoplasm of upper-outer quadrant of right female breast: Secondary | ICD-10-CM | POA: Diagnosis not present

## 2014-06-22 DIAGNOSIS — Z510 Encounter for antineoplastic radiation therapy: Secondary | ICD-10-CM | POA: Diagnosis not present

## 2014-06-22 DIAGNOSIS — I890 Lymphedema, not elsewhere classified: Secondary | ICD-10-CM | POA: Diagnosis not present

## 2014-06-22 DIAGNOSIS — L599 Disorder of the skin and subcutaneous tissue related to radiation, unspecified: Secondary | ICD-10-CM | POA: Diagnosis not present

## 2014-06-23 ENCOUNTER — Other Ambulatory Visit: Payer: Self-pay | Admitting: Family Medicine

## 2014-06-25 ENCOUNTER — Ambulatory Visit
Admission: RE | Admit: 2014-06-25 | Discharge: 2014-06-25 | Disposition: A | Discharge status: Home / Self Care | Payer: Medicare Other | Source: Ambulatory Visit | Attending: Radiation Oncology | Admitting: Radiation Oncology

## 2014-06-25 DIAGNOSIS — C50411 Malignant neoplasm of upper-outer quadrant of right female breast: Secondary | ICD-10-CM | POA: Diagnosis not present

## 2014-06-25 DIAGNOSIS — Z510 Encounter for antineoplastic radiation therapy: Secondary | ICD-10-CM | POA: Diagnosis not present

## 2014-06-25 DIAGNOSIS — I890 Lymphedema, not elsewhere classified: Secondary | ICD-10-CM | POA: Diagnosis not present

## 2014-06-25 DIAGNOSIS — L599 Disorder of the skin and subcutaneous tissue related to radiation, unspecified: Secondary | ICD-10-CM | POA: Diagnosis not present

## 2014-06-26 ENCOUNTER — Ambulatory Visit
Admission: RE | Admit: 2014-06-26 | Discharge: 2014-06-26 | Disposition: A | Discharge status: Home / Self Care | Payer: Medicare Other | Source: Ambulatory Visit | Attending: Radiation Oncology | Admitting: Radiation Oncology

## 2014-06-26 VITALS — BP 135/46 | HR 53 | Temp 98.4°F | Wt 183.0 lb

## 2014-06-26 DIAGNOSIS — I890 Lymphedema, not elsewhere classified: Secondary | ICD-10-CM | POA: Diagnosis not present

## 2014-06-26 DIAGNOSIS — L599 Disorder of the skin and subcutaneous tissue related to radiation, unspecified: Secondary | ICD-10-CM | POA: Diagnosis not present

## 2014-06-26 DIAGNOSIS — Z510 Encounter for antineoplastic radiation therapy: Secondary | ICD-10-CM | POA: Diagnosis not present

## 2014-06-26 DIAGNOSIS — C50411 Malignant neoplasm of upper-outer quadrant of right female breast: Secondary | ICD-10-CM

## 2014-06-26 DIAGNOSIS — C50412 Malignant neoplasm of upper-outer quadrant of left female breast: Secondary | ICD-10-CM | POA: Diagnosis not present

## 2014-06-26 NOTE — Progress Notes (Signed)
Weekly assessment of radiation to right breast.Completed 31 of 33 treatments.Skin is discolored with a nickel sized area of peele on breast where tape was located.Apply neosporin twice daily and continue radiaplex.Given card to schedule one month follow up.Patient has enough gel to apply for 2 to 3 weeks.

## 2014-06-26 NOTE — Progress Notes (Signed)
Weekly Management Note Current Dose:  57 Gy  Projected Dose: 61 Gy   Narrative:  The patient presents for routine under treatment assessment.  CBCT/MVCT images/Port film x-rays were reviewed.  The chart was checked. Finishes RT on Thursday. Feeling well. Skin is "a Little " irritated. Does not have appt with surgery or medical oncology. Does not have script for AI.   Physical Findings: Weight: 183 lb (83.008 kg). Unchanged. Pink right breast with some hyperpigmentation.   Impression:  The patient is tolerating radiation.  Plan:  Continue treatment as planned. Continue radiaplex. Will contact med onc scheduler for follow up appt with Dr. Lindi Adie in next few weeks. Follow up in 1 month with me. Post RT skin care discussed.

## 2014-06-27 ENCOUNTER — Telehealth: Payer: Self-pay | Admitting: Hematology and Oncology

## 2014-06-27 ENCOUNTER — Telehealth: Payer: Self-pay | Admitting: *Deleted

## 2014-06-27 ENCOUNTER — Ambulatory Visit: Payer: Medicare Other

## 2014-06-27 ENCOUNTER — Ambulatory Visit
Admission: RE | Admit: 2014-06-27 | Discharge: 2014-06-27 | Disposition: A | Discharge status: Home / Self Care | Payer: Medicare Other | Source: Ambulatory Visit | Attending: Radiation Oncology | Admitting: Radiation Oncology

## 2014-06-27 DIAGNOSIS — C50411 Malignant neoplasm of upper-outer quadrant of right female breast: Secondary | ICD-10-CM | POA: Diagnosis not present

## 2014-06-27 DIAGNOSIS — L599 Disorder of the skin and subcutaneous tissue related to radiation, unspecified: Secondary | ICD-10-CM | POA: Diagnosis not present

## 2014-06-27 DIAGNOSIS — Z510 Encounter for antineoplastic radiation therapy: Secondary | ICD-10-CM | POA: Diagnosis not present

## 2014-06-27 DIAGNOSIS — I890 Lymphedema, not elsewhere classified: Secondary | ICD-10-CM | POA: Diagnosis not present

## 2014-06-27 NOTE — Telephone Encounter (Signed)
Called patient to inform of fu with Dr. Lindi Adie on 07-20-14 @ 12:30 pm, spoke with patient's granddaughter [place] and gave her the appt.

## 2014-06-27 NOTE — Telephone Encounter (Signed)
Received a call from shirley with rad onc and per dr Pablo Ledger this patient needs a follow up in 2-3 weeks.  i have scheduled her an appointment and left shirley a message.

## 2014-06-28 ENCOUNTER — Ambulatory Visit: Payer: Medicare Other

## 2014-06-28 ENCOUNTER — Ambulatory Visit
Admission: RE | Admit: 2014-06-28 | Discharge: 2014-06-28 | Disposition: A | Discharge status: Home / Self Care | Payer: Medicare Other | Source: Ambulatory Visit | Attending: Radiation Oncology | Admitting: Radiation Oncology

## 2014-06-28 ENCOUNTER — Encounter: Payer: Self-pay | Admitting: Radiation Oncology

## 2014-06-28 DIAGNOSIS — I890 Lymphedema, not elsewhere classified: Secondary | ICD-10-CM | POA: Diagnosis not present

## 2014-06-28 DIAGNOSIS — Z510 Encounter for antineoplastic radiation therapy: Secondary | ICD-10-CM | POA: Diagnosis not present

## 2014-06-28 DIAGNOSIS — L599 Disorder of the skin and subcutaneous tissue related to radiation, unspecified: Secondary | ICD-10-CM | POA: Diagnosis not present

## 2014-06-28 DIAGNOSIS — C50411 Malignant neoplasm of upper-outer quadrant of right female breast: Secondary | ICD-10-CM | POA: Diagnosis not present

## 2014-06-29 ENCOUNTER — Ambulatory Visit: Payer: Medicare Other | Admitting: Physical Therapy

## 2014-06-29 DIAGNOSIS — I890 Lymphedema, not elsewhere classified: Secondary | ICD-10-CM

## 2014-06-29 DIAGNOSIS — C50411 Malignant neoplasm of upper-outer quadrant of right female breast: Secondary | ICD-10-CM | POA: Diagnosis not present

## 2014-06-29 NOTE — Patient Instructions (Signed)
Elbow flexion and extension and forearm pronation and supination in bandages. Leave bandages on until she returns on Monday if possible. If she has to take bandages off, please bring everything back to next visit

## 2014-06-29 NOTE — Therapy (Signed)
[place], [place], [address] Phone: [phone]   Fax:  [phone]  Physical Therapy Treatment  Patient Details  Name: Whitney [name] MRN: [id number] Date of Birth: 02-06-1928 Referring Provider:  [name], MD  Encounter Date: 06/29/2014      PT End of Session - 06/29/14 1216    Visit Number 8   Number of Visits 19   Date for PT Re-Evaluation 07/13/14   PT Start Time 5400   PT Stop Time 1100   PT Time Calculation (min) 45 min      Past Medical History  Diagnosis Date  . Hypertension   . Hyperlipidemia   . Diabetes mellitus without complication   . Thyroid disease     hypothyroidism  . Anemia   . Former smoker   . Multinodular goiter   . Complication of anesthesia     slow to wake up  . Cancer     right breast  . Wears glasses   . Full dentures   . Dysrhythmia 10/15    AF  . Breast cancer 10/2013    right upper outer  . Atrial fibrillation     Past Surgical History  Procedure Laterality Date  . Abdominal hysterectomy    . Portacath placement N/A 11/15/2013    Procedure: INSERTION PORT-A-CATH WITH ULTRA SOUND AND FLOROSCOPY;  Surgeon: [name], MD;  Location: [facility];  Service: General;  Laterality: N/A;  . Eye surgery  12/22/2009    cataracts  . Total knee arthroplasty  2002    left  . Port-a-cath removal Right 01/09/2014    Procedure: REMOVAL PORT-A-CATH;  Surgeon: [name], MD;  Location: [facility];  Service: General;  Laterality: Right;  . Breast lumpectomy with radioactive seed localization Right 01/09/2014    Procedure: RIGHT BREAST SEED LOCALIZED LUMPECTOMY ;  Surgeon: [name], MD;  Location: [facility];  Service: General;  Laterality: Right;  . Axillary lymph node dissection Right 01/09/2014    Procedure: RIGHT AXILLARY LYMPH NODE DISECTION;  Surgeon: [name], MD;  Location: [facility];   Service: General;  Laterality: Right;  . Intramedullary (im) nail intertrochanteric Left 02/24/2014    Procedure: IM ROD LEFT HIP FX;  Surgeon: Alta Corning, MD;  Location: [facility];  Service: Orthopedics;  Laterality: Left;    There were no vitals filed for this visit.  Visit Diagnosis:  Lymphedema of upper extremity      Subjective Assessment - 06/29/14 1024    Subjective pt states she had her last radiation treatment yesterday.  she has been wearing her tg soft, Her family has not been doing manual lymph drainage at home.  Another daughter is here today, but Jolette said she needs more education.    Pertinent History Right breast cancer diagnosed 10/23/13; had neo-adjuvant chemotherapy but developed atrial fibrillation and was hospitalized.  Had lumpectomy in August; 5 lymph nodes removed.  [place] and broke hip in December 2015 and had rod placed (left side).  Now doing radiation, which is due to end June 27, 2014.  Diabetes, HTN, atrial fibrillation--on meds for these.   Currently in Pain? No/denies               LYMPHEDEMA/ONCOLOGY QUESTIONNAIRE - 06/29/14 1026    Right Upper Extremity Lymphedema   Olecranon Process 25.6 cm   15 cm Proximal to Ulnar Styloid Process 24.5 cm   10 cm Proximal to Ulnar  Styloid Process 22.3 cm   Just Proximal to Ulnar Styloid Process 18.4 cm   Across Hand at [employer] 19.5 cm   At [place] of 2nd Digit 7 cm                [facility] Adult PT Treatment/Exercise - 06/29/14 0001    Manual Therapy   Manual Lymphatic Drainage (MLD) In supine, short neck, left axilla and anterior interaxillary anastomosis, right groin and axillo-inguinal anastomosis, and right UE from fingers to shoulder.   Compression Bandaging thick lotion applies, small tg soft to arm, elastomull to fingers 2,3,4 2 artiflex with foam at antecubital fossa, 2 short stretch bandages hand to shoulder.,                PT Education - 06/29/14 1214    Education provided Yes    Education Details exercise in bandages    Person(s) Educated Patient;Child(ren)   Methods Explanation;Demonstration;Verbal cues   Comprehension Returned demonstration           Short Term Clinic Goals - 06/29/14 1225    CC Short Term Goal  #1   Title Pt. will be knowledgeable about lymphedema risk reduction.   Status On-going   CC Short Term Goal  #2   Title Right arm circumference at 10 cm. proximal to ulnar styloid reduced by at least 1 cm.   Status Achieved             Long Term Clinic Goals - 06/29/14 1225    CC Long Term Goal  #1   Title Circumference at 10 cm. proximal to right ulnar styloid reduced by 2 cm.   Status Achieved   CC Long Term Goal  #2   Title Will be knowledgeable about where/how to obtain compression sleeve and glove; also about other equipment available to help manage swelling.   Status On-going   CC Long Term Goal  #3   Title Will be knowledgeable about self-care for lymphedema, including manual lymph drainage.   Status On-going            Plan - 06/29/14 1217    Clinical Impression Statement Arm is showing good reduction  per measurement and observation with most of lymphedem and distal upper posterior arm. Goals for reduction at 10 cm proximal to the ulnar styloid have been met. Patient and daughter agreeable to compression wrapping today with hopes to reduce fluid in this area in hopes that she may not need further compression garment.  Qustion if  she will tolerate tight compression sleeve as it can be damaging to  shoulder,.  She may possbily be a candidte for a juxtafit for intermittent wear if needed   Clinical Impairments Affecting Rehab Potential 81 yo with diffuse muscle atophy   PT Frequency 3x / week   PT Next Visit Plan Assess effectiveness of bandaging .  continue to problem solve and plan for follow up compression gaments if needed continue with complete decongestive therapy   Consulted and Agree with Plan of Care Patient;Family  member/caregiver   Family Member Consulted daughter        Problem List Patient Active Problem List   Diagnosis Date Noted  . Acute blood loss anemia 02/25/2014  . Closed left hip fracture 02/24/2014  . Breast cancer of upper-outer quadrant of left female breast 02/14/2014  . PAF (paroxysmal atrial fibrillation) 01/30/2014  . Anticoagulated 01/30/2014  . Chronic diastolic CHF (congestive heart failure), NYHA class 2 12/10/2013  . Preoperative  cardiovascular examination 12/10/2013  . Neutropenic fever 12/08/2013  . Anemia of chronic disease 12/08/2013  . Thrombocytopenia 12/08/2013  . Hypokalemia 12/08/2013  . Pancytopenia due to antineoplastic chemotherapy 12/08/2013  . Hypothyroidism 12/08/2013  . Breast cancer of upper-outer quadrant of right female breast 11/01/2013  . Essential hypertension, benign 12/01/2012  . Pure hypercholesterolemia 12/01/2012  . DM2 (diabetes mellitus, type 2) 11/21/2012   Donato Heinz. Owens Shark, PT  06/29/2014, 12:27 PM  [place], [place], [address] Phone: [phone]   Fax:  [phone]

## 2014-07-02 ENCOUNTER — Ambulatory Visit: Payer: Medicare Other

## 2014-07-02 DIAGNOSIS — I890 Lymphedema, not elsewhere classified: Secondary | ICD-10-CM

## 2014-07-02 DIAGNOSIS — C50411 Malignant neoplasm of upper-outer quadrant of right female breast: Secondary | ICD-10-CM | POA: Diagnosis not present

## 2014-07-02 NOTE — Therapy (Signed)
[place], [place], [address] Phone: [phone]   Fax:  [phone]  Physical Therapy Treatment  Patient Details  Name: Whitney [name] MRN: [id number] Date of Birth: 02-Jan-1928 Referring Provider:  [name], MD  Encounter Date: 07/02/2014      PT End of Session - 07/02/14 1151    Visit Number 9   Number of Visits 19   Date for PT Re-Evaluation 07/13/14   PT Start Time 1022   PT Stop Time 1100   PT Time Calculation (min) 38 min      Past Medical History  Diagnosis Date  . Hypertension   . Hyperlipidemia   . Diabetes mellitus without complication   . Thyroid disease     hypothyroidism  . Anemia   . Former smoker   . Multinodular goiter   . Complication of anesthesia     slow to wake up  . Cancer     right breast  . Wears glasses   . Full dentures   . Dysrhythmia 10/15    AF  . Breast cancer 10/2013    right upper outer  . Atrial fibrillation     Past Surgical History  Procedure Laterality Date  . Abdominal hysterectomy    . Portacath placement N/A 11/15/2013    Procedure: INSERTION PORT-A-CATH WITH ULTRA SOUND AND FLOROSCOPY;  Surgeon: [name], MD;  Location: [facility];  Service: General;  Laterality: N/A;  . Eye surgery  12/22/2009    cataracts  . Total knee arthroplasty  2002    left  . Port-a-cath removal Right 01/09/2014    Procedure: REMOVAL PORT-A-CATH;  Surgeon: [name], MD;  Location: [facility];  Service: General;  Laterality: Right;  . Breast lumpectomy with radioactive seed localization Right 01/09/2014    Procedure: RIGHT BREAST SEED LOCALIZED LUMPECTOMY ;  Surgeon: [name], MD;  Location: [facility];  Service: General;  Laterality: Right;  . Axillary lymph node dissection Right 01/09/2014    Procedure: RIGHT AXILLARY LYMPH NODE DISECTION;  Surgeon: [name], MD;  Location: [facility];   Service: General;  Laterality: Right;  . Intramedullary (im) nail intertrochanteric Left 02/24/2014    Procedure: IM ROD LEFT HIP FX;  Surgeon: Alta Corning, MD;  Location: [facility];  Service: Orthopedics;  Laterality: Left;    There were no vitals filed for this visit.  Visit Diagnosis:  Lymphedema of upper extremity      Subjective Assessment - 07/02/14 1028    Subjective Tolerated the bandages well, my thumb swelled up but overall it felt good and I left them on.   Currently in Pain? No/denies                         [facility] Adult PT Treatment/Exercise - 07/02/14 0001    Manual Therapy   Manual Lymphatic Drainage (MLD) In supine, short neck, left axilla and anterior interaxillary anastomosis, right groin and axillo-inguinal anastomosis, and right UE from fingers to shoulder.   Compression Bandaging Biotone lotion applied, small tg soft to arm, elastomull to all fingers, Artiflex x1 with foam at antecubital fossa, 1-6 cm and 2-10 cm short stretch compression bandages hand to axilla.,                   Short Term Clinic Goals - 06/29/14 1225    CC Short Term Goal  #1   Title  Pt. will be knowledgeable about lymphedema risk reduction.   Status On-going   CC Short Term Goal  #2   Title Right arm circumference at 10 cm. proximal to ulnar styloid reduced by at least 1 cm.   Status Achieved             Long Term Clinic Goals - 06/29/14 1225    CC Long Term Goal  #1   Title Circumference at 10 cm. proximal to right ulnar styloid reduced by 2 cm.   Status Achieved   CC Long Term Goal  #2   Title Will be knowledgeable about where/how to obtain compression sleeve and glove; also about other equipment available to help manage swelling.   Status On-going   CC Long Term Goal  #3   Title Will be knowledgeable about self-care for lymphedema, including manual lymph drainage.   Status On-going            Plan - 07/02/14 1152    Clinical Impression  Statement Pt continues to have visible reductions of UE. Came in today still wearing bandage from last visit reporting no problem with wear, except pts thumb visibly had increased fluid today, so included thumb in finger bandages. Overall pt tolerating very well but has not received manual lymph drainage from daughter that was instructed Clyda Greener), as pt reports Jolette still doesnt feel comfortable and will be back for further review on Wednesday.   Pt will benefit from skilled therapeutic intervention in order to improve on the following deficits Increased edema;Decreased knowledge of precautions   Rehab Potential Excellent   Clinical Impairments Affecting Rehab Potential 81 yo with diffuse muscle atophy   PT Frequency 3x / week   PT Duration 6 weeks   PT Treatment/Interventions Manual lymph drainage;Compression bandaging;Patient/family education;DME Instruction   PT Next Visit Plan Continue to problem solve and plan for follow up compression gaments if needed continue with complete decongestive therapy. Remeasure circumference and assess goals.   Consulted and Agree with Plan of Care Patient;Family member/caregiver   Family Member Consulted Granddaughter [place] that lives with pt present today.        Problem List Patient Active Problem List   Diagnosis Date Noted  . Acute blood loss anemia 02/25/2014  . Closed left hip fracture 02/24/2014  . Breast cancer of upper-outer quadrant of left female breast 02/14/2014  . PAF (paroxysmal atrial fibrillation) 01/30/2014  . Anticoagulated 01/30/2014  . Chronic diastolic CHF (congestive heart failure), NYHA class 2 12/10/2013  . Preoperative cardiovascular examination 12/10/2013  . Neutropenic fever 12/08/2013  . Anemia of chronic disease 12/08/2013  . Thrombocytopenia 12/08/2013  . Hypokalemia 12/08/2013  . Pancytopenia due to antineoplastic chemotherapy 12/08/2013  . Hypothyroidism 12/08/2013  . Breast cancer of upper-outer quadrant of  right female breast 11/01/2013  . Essential hypertension, benign 12/01/2012  . Pure hypercholesterolemia 12/01/2012  . DM2 (diabetes mellitus, type 2) 11/21/2012    Otelia Limes, PTA 07/02/2014, 11:56 AM  [place], [place], [address] Phone: [phone]   Fax:  [phone]

## 2014-07-03 ENCOUNTER — Ambulatory Visit: Payer: Medicare Other

## 2014-07-03 NOTE — Progress Notes (Signed)
°  Radiation Oncology         [phone] ________________________________  Name: Whitney [name] MRN: [id number]  Date: 06/28/2014  DOB: 07/01/1927  End of Treatment Note  Diagnosis:   Stage III right breast cancer     Indication for treatment:  Curative    Radiation treatment dates:   05/14/2014-06/28/2014  Site/dose:    Right breast / 75 Gray @ 1.8 Pearline Cables per fraction x 25 fractions Right supraclavicular fossa and axilla/ 45 Gy @1 .8 Gy per fraction x 25 fractions Right breast boost / 16 Gray at [employer] per fraction x 8 fractions  Beams/energy:  Opposed Tangents / 6 MV photons LAO and PA / 6 and 10 MV photons 15 MeV en face electrons  Narrative: The patient tolerated radiation treatment relatively well.   She had skin darkening and discomfort as expected.  She had mild fatigue.   Plan: The patient has completed radiation treatment. The patient will return to radiation oncology clinic for routine followup in one month. I advised them to call or return sooner if they have any questions or concerns related to their recovery or treatment.  ------------------------------------------------  [name], MD

## 2014-07-04 ENCOUNTER — Ambulatory Visit: Payer: Medicare Other

## 2014-07-04 DIAGNOSIS — I890 Lymphedema, not elsewhere classified: Secondary | ICD-10-CM | POA: Diagnosis not present

## 2014-07-04 DIAGNOSIS — C50411 Malignant neoplasm of upper-outer quadrant of right female breast: Secondary | ICD-10-CM | POA: Diagnosis not present

## 2014-07-04 NOTE — Therapy (Signed)
[place], [place], [address] Phone: [phone]   Fax:  [phone]  Physical Therapy Treatment  Patient Details  Name: Whitney [name] MRN: [id number] Date of Birth: August 27, 1927 Referring Provider:  [name], MD  Encounter Date: 07/04/2014      PT End of Session - 07/04/14 1109    Visit Number 10   Number of Visits 19   Date for PT Re-Evaluation 07/13/14   PT Start Time 1020   PT Stop Time 1107   PT Time Calculation (min) 47 min      Past Medical History  Diagnosis Date  . Hypertension   . Hyperlipidemia   . Diabetes mellitus without complication   . Thyroid disease     hypothyroidism  . Anemia   . Former smoker   . Multinodular goiter   . Complication of anesthesia     slow to wake up  . Cancer     right breast  . Wears glasses   . Full dentures   . Dysrhythmia 10/15    AF  . Breast cancer 10/2013    right upper outer  . Atrial fibrillation     Past Surgical History  Procedure Laterality Date  . Abdominal hysterectomy    . Portacath placement N/A 11/15/2013    Procedure: INSERTION PORT-A-CATH WITH ULTRA SOUND AND FLOROSCOPY;  Surgeon: [name], MD;  Location: [facility];  Service: General;  Laterality: N/A;  . Eye surgery  12/22/2009    cataracts  . Total knee arthroplasty  2002    left  . Port-a-cath removal Right 01/09/2014    Procedure: REMOVAL PORT-A-CATH;  Surgeon: [name], MD;  Location: [facility];  Service: General;  Laterality: Right;  . Breast lumpectomy with radioactive seed localization Right 01/09/2014    Procedure: RIGHT BREAST SEED LOCALIZED LUMPECTOMY ;  Surgeon: [name], MD;  Location: [facility];  Service: General;  Laterality: Right;  . Axillary lymph node dissection Right 01/09/2014    Procedure: RIGHT AXILLARY LYMPH NODE DISECTION;  Surgeon: [name], MD;  Location: [facility];   Service: General;  Laterality: Right;  . Intramedullary (im) nail intertrochanteric Left 02/24/2014    Procedure: IM ROD LEFT HIP FX;  Surgeon: Alta Corning, MD;  Location: [facility];  Service: Orthopedics;  Laterality: Left;    There were no vitals filed for this visit.  Visit Diagnosis:  Lymphedema of upper extremity      Subjective Assessment - 07/04/14 1030    Subjective Want to take the bandages off before next appt so I can shower! They are feeling okay though, not having any problems.                LYMPHEDEMA/ONCOLOGY QUESTIONNAIRE - 07/04/14 1031    Right Upper Extremity Lymphedema   10 cm Proximal to Olecranon Process 29.5 cm   Olecranon Process 24.8 cm   15 cm Proximal to Ulnar Styloid Process 23.5 cm   10 cm Proximal to Ulnar Styloid Process 21.2 cm   Just Proximal to Ulnar Styloid Process 16.8 cm   Across Hand at [employer] 19 cm   At [place] of 2nd Digit 7 cm                  [facility] Adult PT Treatment/Exercise - 07/04/14 0001    Manual Therapy   Manual Lymphatic Drainage (MLD) In supine, short neck, left axilla and anterior  interaxillary anastomosis, right groin and axillo-inguinal anastomosis, and right UE from fingers to shoulder reviewing with [name] throughout.   Compression Bandaging Biotone lotion applied, medium tg soft to arm, elastomull to fingers 1-4, Artiflex x1 with foam at antecubital fossa, 1-6 cm and 2-10 cm short stretch compression bandages hand to axilla.,                   Short Term Clinic Goals - 07/04/14 1120    CC Short Term Goal  #1   Title Pt. will be knowledgeable about lymphedema risk reduction.   Status Achieved             Long Term Clinic Goals - 07/04/14 1120    CC Long Term Goal  #2   Title Will be knowledgeable about where/how to obtain compression sleeve and glove; also about other equipment available to help manage swelling.   Status On-going   CC Long Term Goal  #3   Title Will be  knowledgeable about self-care for lymphedema, including manual lymph drainage.  Daughter has been instructed in this and reports will try this weekend after removing pts bandages for wash.    Status On-going            Plan - 07/04/14 1109    Clinical Impression Statement Pts circumference measurements have reduced greatly. Tolerating bandaging well, and has good family support. Pt will be ready to measure for compression garments possibly next week, whether sleeve or reduction type velcro garment.    Pt will benefit from skilled therapeutic intervention in order to improve on the following deficits Increased edema;Decreased knowledge of precautions   Rehab Potential Excellent   Clinical Impairments Affecting Rehab Potential 81 yo with diffuse muscle atophy   PT Frequency 3x / week   PT Duration 6 weeks   PT Treatment/Interventions Manual lymph drainage;Compression bandaging;Patient/family education;DME Instruction   PT Next Visit Plan Continue to problem solve and plan for follow up compression gaments if needed continue with complete decongestive therapy.    Consulted and Agree with Plan of Care Patient        Problem List Patient Active Problem List   Diagnosis Date Noted  . Acute blood loss anemia 02/25/2014  . Closed left hip fracture 02/24/2014  . Breast cancer of upper-outer quadrant of left female breast 02/14/2014  . PAF (paroxysmal atrial fibrillation) 01/30/2014  . Anticoagulated 01/30/2014  . Chronic diastolic CHF (congestive heart failure), NYHA class 2 12/10/2013  . Preoperative cardiovascular examination 12/10/2013  . Neutropenic fever 12/08/2013  . Anemia of chronic disease 12/08/2013  . Thrombocytopenia 12/08/2013  . Hypokalemia 12/08/2013  . Pancytopenia due to antineoplastic chemotherapy 12/08/2013  . Hypothyroidism 12/08/2013  . Breast cancer of upper-outer quadrant of right female breast 11/01/2013  . Essential hypertension, benign 12/01/2012  . Pure  hypercholesterolemia 12/01/2012  . DM2 (diabetes mellitus, type 2) 11/21/2012    Otelia Limes, PTA 07/04/2014, 11:22 AM  [place], [place], [address] Phone: [phone]   Fax:  [phone]

## 2014-07-06 ENCOUNTER — Ambulatory Visit: Payer: Medicare Other | Admitting: Physical Therapy

## 2014-07-06 DIAGNOSIS — I890 Lymphedema, not elsewhere classified: Secondary | ICD-10-CM | POA: Diagnosis not present

## 2014-07-06 DIAGNOSIS — C50411 Malignant neoplasm of upper-outer quadrant of right female breast: Secondary | ICD-10-CM | POA: Diagnosis not present

## 2014-07-06 NOTE — Progress Notes (Signed)
Name: Whitney [name]   MRN: [id number]  Date:  05/30/14   DOB: [birth year]  Status:outpatient    DIAGNOSIS: No diagnosis found.  CONSENT VERIFIED: yes   SET UP: Patient is setup supine   IMMOBILIZATION:  The following immobilization was used:Custom Moldable Pillow, breast board.   NARRATIVE: [name] underwent complex simulation and treatment planning for her boost treatment today.  Her tumor volume was outlined on the planning CT scan. The depth of her cavity was felt to be appropriate for treatment with electrons    15  MeV electrons will be prescribed to the 100%  isodose line.   I personally oversaw and approved the construction of a unique block which will be used for beam modification purposes.  A special port plan is requested.

## 2014-07-06 NOTE — Therapy (Signed)
[place], [place], [address] Phone: [phone]   Fax:  [phone]  Physical Therapy Treatment  Patient Details  Name: Whitney [name] MRN: [id number] Date of Birth: Jul 19, 1927 Referring Provider:  [name], MD  Encounter Date: 07/06/2014      PT End of Session - 07/06/14 1106    Visit Number 11   Number of Visits 19   Date for PT Re-Evaluation 07/13/14   PT Start Time 1016   PT Stop Time 1100   PT Time Calculation (min) 44 min      Past Medical History  Diagnosis Date  . Hypertension   . Hyperlipidemia   . Diabetes mellitus without complication   . Thyroid disease     hypothyroidism  . Anemia   . Former smoker   . Multinodular goiter   . Complication of anesthesia     slow to wake up  . Cancer     right breast  . Wears glasses   . Full dentures   . Dysrhythmia 10/15    AF  . Breast cancer 10/2013    right upper outer  . Atrial fibrillation     Past Surgical History  Procedure Laterality Date  . Abdominal hysterectomy    . Portacath placement N/A 11/15/2013    Procedure: INSERTION PORT-A-CATH WITH ULTRA SOUND AND FLOROSCOPY;  Surgeon: [name], MD;  Location: [facility];  Service: General;  Laterality: N/A;  . Eye surgery  12/22/2009    cataracts  . Total knee arthroplasty  2002    left  . Port-a-cath removal Right 01/09/2014    Procedure: REMOVAL PORT-A-CATH;  Surgeon: [name], MD;  Location: [facility];  Service: General;  Laterality: Right;  . Breast lumpectomy with radioactive seed localization Right 01/09/2014    Procedure: RIGHT BREAST SEED LOCALIZED LUMPECTOMY ;  Surgeon: [name], MD;  Location: [facility];  Service: General;  Laterality: Right;  . Axillary lymph node dissection Right 01/09/2014    Procedure: RIGHT AXILLARY LYMPH NODE DISECTION;  Surgeon: [name], MD;  Location: [facility];   Service: General;  Laterality: Right;  . Intramedullary (im) nail intertrochanteric Left 02/24/2014    Procedure: IM ROD LEFT HIP FX;  Surgeon: Alta Corning, MD;  Location: [facility];  Service: Orthopedics;  Laterality: Left;    There were no vitals filed for this visit.  Visit Diagnosis:  Lymphedema of upper extremity      Subjective Assessment - 07/06/14 1026    Subjective pt says she if feeeling good.  A little swelling noted in fingers.    Patient is accompained by: Family member   Pertinent History Right breast cancer diagnosed 10/23/13; had neo-adjuvant chemotherapy but developed atrial fibrillation and was hospitalized.  Had lumpectomy in August; 5 lymph nodes removed.  [place] and broke hip in December 2015 and had rod placed (left side).  Now doing radiation, which is due to end June 27, 2014.  Diabetes, HTN, atrial fibrillation--on meds for these.                         [facility] Adult PT Treatment/Exercise - 07/06/14 0001    Manual Therapy   Manual Lymphatic Drainage (MLD) In supine, short neck, left axilla and anterior interaxillary anastomosis, right groin and axillo-inguinal anastomosis, and right UE from fingers to shoulder reviewing with [name] throughout.   Compression Bandaging Biotone lotion  applied, medium tg soft to arm, telfa applied to thumb web space elastomull to fingers 1-4, with extra attention to thumb  Artiflex x1 with foam at antecubital fossa, 1-6 cm and 2-10 cm short stretch compression bandages hand to axilla.,                   Short Term Clinic Goals - 07/04/14 1120    CC Short Term Goal  #1   Title Pt. will be knowledgeable about lymphedema risk reduction.   Status Achieved             Long Term Clinic Goals - 07/06/14 1158    CC Long Term Goal  #2   Title Will be knowledgeable about where/how to obtain compression sleeve and glove; also about other equipment available to help manage swelling.   Status On-going    CC Long Term Goal  #3   Title Will be knowledgeable about self-care for lymphedema, including manual lymph drainage.   Status On-going            Plan - 07/06/14 1107    Clinical Impression Statement pt continues to have reduction in arm, but edema in hand and thumb noticed today. Pt is ready for compression garments.  Asked pts to take bandages off on Sunday and use conservative treatement of elevation, exercise and use of Tg soft and then will be remeasured on Monday to see if she has had increases in circumverence measurement.    PT Next Visit Plan if no significant increase in circumferential measurement after being without bandages for 24 hours, may decrease to 2x per week. continue with CDT including bandaging with patient removing bandages one day prior to returning.  Check with Hoyle Sauer about when she will measure pt and make sure she has been bandaged prior to measurement appt.   Consulted and Agree with Plan of Care Patient;Family member/caregiver        Problem List Patient Active Problem List   Diagnosis Date Noted  . Acute blood loss anemia 02/25/2014  . Closed left hip fracture 02/24/2014  . Breast cancer of upper-outer quadrant of left female breast 02/14/2014  . PAF (paroxysmal atrial fibrillation) 01/30/2014  . Anticoagulated 01/30/2014  . Chronic diastolic CHF (congestive heart failure), NYHA class 2 12/10/2013  . Preoperative cardiovascular examination 12/10/2013  . Neutropenic fever 12/08/2013  . Anemia of chronic disease 12/08/2013  . Thrombocytopenia 12/08/2013  . Hypokalemia 12/08/2013  . Pancytopenia due to antineoplastic chemotherapy 12/08/2013  . Hypothyroidism 12/08/2013  . Breast cancer of upper-outer quadrant of right female breast 11/01/2013  . Essential hypertension, benign 12/01/2012  . Pure hypercholesterolemia 12/01/2012  . DM2 (diabetes mellitus, type 2) 11/21/2012   Donato Heinz. Owens Shark, PT 07/06/2014, 11:59 AM  [place], [place], [address] Phone: [phone]   Fax:  [phone]

## 2014-07-09 ENCOUNTER — Telehealth: Payer: Self-pay | Admitting: Family Medicine

## 2014-07-09 ENCOUNTER — Ambulatory Visit: Payer: Medicare Other

## 2014-07-09 DIAGNOSIS — I890 Lymphedema, not elsewhere classified: Secondary | ICD-10-CM

## 2014-07-09 DIAGNOSIS — C50411 Malignant neoplasm of upper-outer quadrant of right female breast: Secondary | ICD-10-CM | POA: Diagnosis not present

## 2014-07-09 MED ORDER — APIXABAN 5 MG PO TABS: 5.0000 mg | ORAL_TABLET | Freq: Two times a day (BID) | ORAL | Status: DC

## 2014-07-09 NOTE — Telephone Encounter (Signed)
Med approved through 07/09/2015 and pharm aware

## 2014-07-09 NOTE — Telephone Encounter (Signed)
PA submitted through [email or web address]  

## 2014-07-09 NOTE — Therapy (Signed)
[place], [place], [address] Phone: [phone]   Fax:  [phone]  Physical Therapy Treatment  Patient Details  Name: Whitney [name] MRN: [id number] Date of Birth: 27-Feb-1928 Referring Provider:  [name], MD  Encounter Date: 07/09/2014      PT End of Session - 07/09/14 1105    Visit Number 12   Number of Visits 19   Date for PT Re-Evaluation 07/13/14   PT Start Time 1026   PT Stop Time 1104   PT Time Calculation (min) 38 min      Past Medical History  Diagnosis Date  . Hypertension   . Hyperlipidemia   . Diabetes mellitus without complication   . Thyroid disease     hypothyroidism  . Anemia   . Former smoker   . Multinodular goiter   . Complication of anesthesia     slow to wake up  . Cancer     right breast  . Wears glasses   . Full dentures   . Dysrhythmia 10/15    AF  . Breast cancer 10/2013    right upper outer  . Atrial fibrillation     Past Surgical History  Procedure Laterality Date  . Abdominal hysterectomy    . Portacath placement N/A 11/15/2013    Procedure: INSERTION PORT-A-CATH WITH ULTRA SOUND AND FLOROSCOPY;  Surgeon: [name], MD;  Location: [facility];  Service: General;  Laterality: N/A;  . Eye surgery  12/22/2009    cataracts  . Total knee arthroplasty  2002    left  . Port-a-cath removal Right 01/09/2014    Procedure: REMOVAL PORT-A-CATH;  Surgeon: [name], MD;  Location: [facility];  Service: General;  Laterality: Right;  . Breast lumpectomy with radioactive seed localization Right 01/09/2014    Procedure: RIGHT BREAST SEED LOCALIZED LUMPECTOMY ;  Surgeon: [name], MD;  Location: [facility];  Service: General;  Laterality: Right;  . Axillary lymph node dissection Right 01/09/2014    Procedure: RIGHT AXILLARY LYMPH NODE DISECTION;  Surgeon: [name], MD;  Location: [facility];   Service: General;  Laterality: Right;  . Intramedullary (im) nail intertrochanteric Left 02/24/2014    Procedure: IM ROD LEFT HIP FX;  Surgeon: Alta Corning, MD;  Location: [facility];  Service: Orthopedics;  Laterality: Left;    There were no vitals filed for this visit.  Visit Diagnosis:  Lymphedema of upper extremity      Subjective Assessment - 07/09/14 1051    Subjective Pt reports no c/o pain with bandaging and thumb web space did not bother her.    Patient is accompained by: Family member   Currently in Pain? No/denies                         [facility] Adult PT Treatment/Exercise - 07/09/14 0001    Manual Therapy   Manual Lymphatic Drainage (MLD) In supine, short neck, left axilla and anterior interaxillary anastomosis, right groin and axillo-inguinal anastomosis, and right UE from fingers to shoulder reviewing with [name] throughout.   Compression Bandaging Biotone lotion applied, medium tg soft to arm, elastomull to fingers 1-4,  Artiflex x1 with foam at antecubital fossa, 1-6 cm and 2-10 cm short stretch compression bandages hand to axilla.,                   Short Term Clinic Goals - 07/04/14  1120    CC Short Term Goal  #1   Title Pt. will be knowledgeable about lymphedema risk reduction.   Status Achieved             Long Term Clinic Goals - 07/06/14 1158    CC Long Term Goal  #2   Title Will be knowledgeable about where/how to obtain compression sleeve and glove; also about other equipment available to help manage swelling.   Status On-going   CC Long Term Goal  #3   Title Will be knowledgeable about self-care for lymphedema, including manual lymph drainage.   Status On-going            Plan - 07/09/14 1105    Clinical Impression Statement Pts arm continues show improvement with decrease of fluid and is tolerating bandaging well, no redness at thumb web space today. Fitter Rexford Maus is looking into getting pt a  garment at no charge to her,.   Pt will benefit from skilled therapeutic intervention in order to improve on the following deficits Increased edema;Decreased knowledge of precautions   Rehab Potential Excellent   Clinical Impairments Affecting Rehab Potential 81 yo with diffuse muscle atophy   PT Frequency 3x / week   PT Duration 6 weeks   PT Treatment/Interventions Manual lymph drainage;Compression bandaging;Patient/family education;DME Instruction   PT Next Visit Plan if no significant increase in circumferential measurement after being without bandages for 24 hours, may decrease to 2x per week. continue with CDT including bandaging with patient removing bandages one day prior to returning.  Check with Hoyle Sauer about when she will measure pt and make sure she has been bandaged prior to measurement appt.   Consulted and Agree with Plan of Care Patient        Problem List Patient Active Problem List   Diagnosis Date Noted  . Acute blood loss anemia 02/25/2014  . Closed left hip fracture 02/24/2014  . Breast cancer of upper-outer quadrant of left female breast 02/14/2014  . PAF (paroxysmal atrial fibrillation) 01/30/2014  . Anticoagulated 01/30/2014  . Chronic diastolic CHF (congestive heart failure), NYHA class 2 12/10/2013  . Preoperative cardiovascular examination 12/10/2013  . Neutropenic fever 12/08/2013  . Anemia of chronic disease 12/08/2013  . Thrombocytopenia 12/08/2013  . Hypokalemia 12/08/2013  . Pancytopenia due to antineoplastic chemotherapy 12/08/2013  . Hypothyroidism 12/08/2013  . Breast cancer of upper-outer quadrant of right female breast 11/01/2013  . Essential hypertension, benign 12/01/2012  . Pure hypercholesterolemia 12/01/2012  . DM2 (diabetes mellitus, type 2) 11/21/2012    Otelia Limes, PTA 07/09/2014, 11:09 AM  [place], [place], [address] Phone: [phone]    Fax:  [phone]

## 2014-07-09 NOTE — Telephone Encounter (Signed)
cierra austin calling to say that patient is needing refill on her blood thinner, said she called pharmacy but has not heard anything back from [place]  [phone]

## 2014-07-09 NOTE — Telephone Encounter (Signed)
Med needed PA - PA done

## 2014-07-11 ENCOUNTER — Ambulatory Visit: Payer: Medicare Other

## 2014-07-11 DIAGNOSIS — C50411 Malignant neoplasm of upper-outer quadrant of right female breast: Secondary | ICD-10-CM | POA: Diagnosis not present

## 2014-07-11 DIAGNOSIS — I890 Lymphedema, not elsewhere classified: Secondary | ICD-10-CM | POA: Diagnosis not present

## 2014-07-11 NOTE — Therapy (Signed)
[place], [place], [address] Phone: [phone]   Fax:  [phone]  Physical Therapy Treatment  Patient Details  Name: Whitney [name] MRN: [id number] Date of Birth: 1928-03-14 Referring Provider:  [name], MD  Encounter Date: 07/11/2014      PT End of Session - 07/11/14 1103    Visit Number 13   Number of Visits 19   Date for PT Re-Evaluation 07/13/14   PT Start Time 5361   PT Stop Time 1102   PT Time Calculation (min) 39 min      Past Medical History  Diagnosis Date  . Hypertension   . Hyperlipidemia   . Diabetes mellitus without complication   . Thyroid disease     hypothyroidism  . Anemia   . Former smoker   . Multinodular goiter   . Complication of anesthesia     slow to wake up  . Cancer     right breast  . Wears glasses   . Full dentures   . Dysrhythmia 10/15    AF  . Breast cancer 10/2013    right upper outer  . Atrial fibrillation     Past Surgical History  Procedure Laterality Date  . Abdominal hysterectomy    . Portacath placement N/A 11/15/2013    Procedure: INSERTION PORT-A-CATH WITH ULTRA SOUND AND FLOROSCOPY;  Surgeon: [name], MD;  Location: [facility];  Service: General;  Laterality: N/A;  . Eye surgery  12/22/2009    cataracts  . Total knee arthroplasty  2002    left  . Port-a-cath removal Right 01/09/2014    Procedure: REMOVAL PORT-A-CATH;  Surgeon: [name], MD;  Location: [facility];  Service: General;  Laterality: Right;  . Breast lumpectomy with radioactive seed localization Right 01/09/2014    Procedure: RIGHT BREAST SEED LOCALIZED LUMPECTOMY ;  Surgeon: [name], MD;  Location: [facility];  Service: General;  Laterality: Right;  . Axillary lymph node dissection Right 01/09/2014    Procedure: RIGHT AXILLARY LYMPH NODE DISECTION;  Surgeon: [name], MD;  Location: [facility];   Service: General;  Laterality: Right;  . Intramedullary (im) nail intertrochanteric Left 02/24/2014    Procedure: IM ROD LEFT HIP FX;  Surgeon: Alta Corning, MD;  Location: [facility];  Service: Orthopedics;  Laterality: Left;    There were no vitals filed for this visit.  Visit Diagnosis:  Lymphedema of upper extremity      Subjective Assessment - 07/11/14 1030    Subjective Havent heard from the fitter. My arm is feeling okay but its alot ot wear all the time.   Currently in Pain? No/denies               LYMPHEDEMA/ONCOLOGY QUESTIONNAIRE - 07/11/14 1031    Right Upper Extremity Lymphedema   10 cm Proximal to Olecranon Process 28.2 cm   Olecranon Process 24.5 cm   15 cm Proximal to Ulnar Styloid Process 23.2 cm   10 cm Proximal to Ulnar Styloid Process 21.2 cm   Just Proximal to Ulnar Styloid Process 16.9 cm   Across Hand at [employer] 18.8 cm   At [place] of 2nd Digit 6.8 cm                  [facility] Adult PT Treatment/Exercise - 07/11/14 0001    Manual Therapy   Manual Lymphatic Drainage (MLD) In supine, short neck, left axilla and  anterior interaxillary anastomosis, right groin and axillo-inguinal anastomosis, and right UE from fingers to shoulder reviewing with [name] throughout.   Compression Bandaging Biotone lotion applied, medium tg soft to arm, elastomull to fingers 1-4,  Artiflex x1 with foam at antecubital fossa, 1-6 cm and 2-10 cm short stretch compression bandages hand to axilla.,                   Short Term Clinic Goals - 07/04/14 1120    CC Short Term Goal  #1   Title Pt. will be knowledgeable about lymphedema risk reduction.   Status Achieved             Long Term Clinic Goals - 07/11/14 1107    CC Long Term Goal  #2   Title Will be knowledgeable about where/how to obtain compression sleeve and glove; also about other equipment available to help manage swelling.   Status On-going   CC Long Term Goal  #3   Title  Will be knowledgeable about self-care for lymphedema, including manual lymph drainage.   Status On-going            Plan - 07/11/14 1103    Clinical Impression Statement Pts circumference measurements overall have maintained from last week. She reports bandaging is becoming a little much for her to tolerate and would like to take it off night before next visit.  Advised pt okay if she will continue to wear TG soft and she verbalized understanding. Still awaiting to hear from  fitter.   Pt will benefit from skilled therapeutic intervention in order to improve on the following deficits Increased edema;Decreased knowledge of precautions   Rehab Potential Excellent   Clinical Impairments Affecting Rehab Potential 81 yo with diffuse muscle atophy   PT Frequency 3x / week   PT Duration 6 weeks   PT Treatment/Interventions Manual lymph drainage;Compression bandaging;Patient/family education;DME Instruction   PT Next Visit Plan if no significant increase in circumferential measurement after being without bandages for 24 hours, may decrease to 2x per week. continue with CDT including bandaging with patient removing bandages one day prior to returning.  Check with Hoyle Sauer about when she will measure pt and make sure she has been bandaged prior to measurement appt.   Consulted and Agree with Plan of Care Patient        Problem List Patient Active Problem List   Diagnosis Date Noted  . Acute blood loss anemia 02/25/2014  . Closed left hip fracture 02/24/2014  . Breast cancer of upper-outer quadrant of left female breast 02/14/2014  . PAF (paroxysmal atrial fibrillation) 01/30/2014  . Anticoagulated 01/30/2014  . Chronic diastolic CHF (congestive heart failure), NYHA class 2 12/10/2013  . Preoperative cardiovascular examination 12/10/2013  . Neutropenic fever 12/08/2013  . Anemia of chronic disease 12/08/2013  . Thrombocytopenia 12/08/2013  . Hypokalemia 12/08/2013  . Pancytopenia due to  antineoplastic chemotherapy 12/08/2013  . Hypothyroidism 12/08/2013  . Breast cancer of upper-outer quadrant of right female breast 11/01/2013  . Essential hypertension, benign 12/01/2012  . Pure hypercholesterolemia 12/01/2012  . DM2 (diabetes mellitus, type 2) 11/21/2012    Otelia Limes, PTA 07/11/2014, 11:08 AM  [place], [place], [address] Phone: [phone]   Fax:  [phone]

## 2014-07-13 ENCOUNTER — Encounter: Payer: Medicare Other | Admitting: Family Medicine

## 2014-07-13 ENCOUNTER — Ambulatory Visit: Payer: Medicare Other | Admitting: Physical Therapy

## 2014-07-13 DIAGNOSIS — I890 Lymphedema, not elsewhere classified: Secondary | ICD-10-CM | POA: Diagnosis not present

## 2014-07-13 DIAGNOSIS — C50411 Malignant neoplasm of upper-outer quadrant of right female breast: Secondary | ICD-10-CM | POA: Diagnosis not present

## 2014-07-13 NOTE — Therapy (Signed)
[place], [place], [address] Phone: [phone]   Fax:  [phone]  Physical Therapy Treatment  Patient Details  Name: Whitney [name] MRN: [id number] Date of Birth: [birth year] Referring Provider:  [name], MD  Encounter Date: 07/13/2014      PT End of Session - 07/13/14 1222    Visit Number 14   Number of Visits 27   Date for PT Re-Evaluation 08/12/14   PT Start Time 6962   PT Stop Time 1100   PT Time Calculation (min) 45 min      Past Medical History  Diagnosis Date  . Hypertension   . Hyperlipidemia   . Diabetes mellitus without complication   . Thyroid disease     hypothyroidism  . Anemia   . Former smoker   . Multinodular goiter   . Complication of anesthesia     slow to wake up  . Cancer     right breast  . Wears glasses   . Full dentures   . Dysrhythmia 10/15    AF  . Breast cancer 10/2013    right upper outer  . Atrial fibrillation     Past Surgical History  Procedure Laterality Date  . Abdominal hysterectomy    . Portacath placement N/A 11/15/2013    Procedure: INSERTION PORT-A-CATH WITH ULTRA SOUND AND FLOROSCOPY;  Surgeon: Whitney Luna, MD;  Location: [facility];  Service: General;  Laterality: N/A;  . Eye surgery  12/22/2009    cataracts  . Total knee arthroplasty  2002    left  . Port-a-cath removal Right 01/09/2014    Procedure: REMOVAL PORT-A-CATH;  Surgeon: Whitney Luna, MD;  Location: [facility];  Service: General;  Laterality: Right;  . Breast lumpectomy with radioactive seed localization Right 01/09/2014    Procedure: RIGHT BREAST SEED LOCALIZED LUMPECTOMY ;  Surgeon: Whitney Luna, MD;  Location: [facility];  Service: General;  Laterality: Right;  . Axillary lymph node dissection Right 01/09/2014    Procedure: RIGHT AXILLARY LYMPH NODE DISECTION;  Surgeon: Whitney Luna, MD;  Location: [facility];   Service: General;  Laterality: Right;  . Intramedullary (im) nail intertrochanteric Left 02/24/2014    Procedure: IM ROD LEFT HIP FX;  Surgeon: Whitney Corning, MD;  Location: [facility];  Service: Orthopedics;  Laterality: Left;    There were no vitals filed for this visit.  Visit Diagnosis:  Lymphedema of upper extremity      Subjective Assessment - 07/13/14 1033    Subjective pt comes in with bandages on.  she has not heard from fitter so I called the fitter  was made to fitter to see if there is a problem Pt agreed to take bandages off on Sunday to be remeasured on Monday to see if she needs to continue   Patient is accompained by: Family member   Pertinent History Right breast cancer diagnosed 10/23/13; had neo-adjuvant chemotherapy but developed atrial fibrillation and was hospitalized.  Had lumpectomy in August; 5 lymph nodes removed.  [place] and broke hip in December 2015 and had rod placed (left side).  Now doing radiation, which is due to end June 27, 2014.  Diabetes, HTN, atrial fibrillation--on meds for these.   Patient Stated Goals reduce the swelling   Currently in Pain? No/denies                         [facility]  Adult PT Treatment/Exercise - 07/13/14 0001    Manual Therapy   Manual Lymphatic Drainage (MLD) In supine, short neck, left axilla and anterior interaxillary anastomosis, right groin and axillo-inguinal anastomosis, and right UE from fingers to shoulder reviewing with daughter Whitney [name] throughout.   Compression Bandaging Biotone lotion applied, medium tg soft to arm, elastomull to fingers 1-4,  Artiflex x1 with foam at antecubital fossa, 1-6 cm and 2-10 cm short stretch compression bandages hand to axilla.,                   Short Term Clinic Goals - 07/04/14 1120    CC Short Term Goal  #1   Title Pt. will be knowledgeable about lymphedema risk reduction.   Status Achieved             Long Term Clinic Goals - 07/13/14 1226    CC Long Term  Goal  #1   Title Circumference at 10 cm. proximal to right ulnar styloid reduced by 2 cm.   Status Achieved   CC Long Term Goal  #2   Title Will be knowledgeable about where/how to obtain compression sleeve and glove; also about other equipment available to help manage swelling.   Status On-going   CC Long Term Goal  #3   Title Will be knowledgeable about self-care for lymphedema, including manual lymph drainage.   Status On-going            Plan - 07/13/14 1223    Clinical Impression Statement pt has some swelling at base of right thumb that is likley from compression bandages. It resolved with manual lymph draiange, elevation and exercise.  She is ready for fitting on velcro compression garment and glove. Recert done.  Need to continue until she receives her garment   Pt will benefit from skilled therapeutic intervention in order to improve on the following deficits Increased edema;Decreased knowledge of precautions   Rehab Potential Excellent   Clinical Impairments Affecting Rehab Potential 81 yo with diffuse muscle atophy   PT Frequency 2x / week   PT Duration 4 weeks   PT Treatment/Interventions Manual lymph drainage;Compression bandaging;Patient/family education;DME Instruction   PT Next Visit Plan Assess  circumferential measurement after being without bandages for 24 hours, may decrease to 2x per week. continue with CDT including bandaging with patient removing bandages one day prior to returning.  Check with Whitney [name] about when she will measure pt and make sure she has been bandaged prior to measurement appt.   Consulted and Agree with Plan of Care Patient        Problem List Patient Active Problem List   Diagnosis Date Noted  . Acute blood loss anemia 02/25/2014  . Closed left hip fracture 02/24/2014  . Breast cancer of upper-outer quadrant of left female breast 02/14/2014  . PAF (paroxysmal atrial fibrillation) 01/30/2014  . Anticoagulated 01/30/2014  . Chronic  diastolic CHF (congestive heart failure), NYHA class 2 12/10/2013  . Preoperative cardiovascular examination 12/10/2013  . Neutropenic fever 12/08/2013  . Anemia of chronic disease 12/08/2013  . Thrombocytopenia 12/08/2013  . Hypokalemia 12/08/2013  . Pancytopenia due to antineoplastic chemotherapy 12/08/2013  . Hypothyroidism 12/08/2013  . Breast cancer of upper-outer quadrant of right female breast 11/01/2013  . Essential hypertension, benign 12/01/2012  . Pure hypercholesterolemia 12/01/2012  . DM2 (diabetes mellitus, type 2) 11/21/2012  Donato Heinz. Owens Shark, PT   Norwood Levo 07/13/2014, 12:30 PM  [place],  [place], [address] Phone: [phone]   Fax:  [phone]

## 2014-07-16 ENCOUNTER — Ambulatory Visit: Payer: Medicare Other | Attending: Radiation Oncology

## 2014-07-16 DIAGNOSIS — C50411 Malignant neoplasm of upper-outer quadrant of right female breast: Secondary | ICD-10-CM | POA: Insufficient documentation

## 2014-07-16 DIAGNOSIS — I890 Lymphedema, not elsewhere classified: Secondary | ICD-10-CM | POA: Insufficient documentation

## 2014-07-16 NOTE — Therapy (Signed)
[place], [place], [address] Phone: [phone]   Fax:  [phone]  Physical Therapy Treatment  Patient Details  Name: Whitney [name] MRN: [id number] Date of Birth: July 01, 1927 Referring Provider:  [name], MD  Encounter Date: 07/16/2014      PT End of Session - 07/16/14 1030    Visit Number 15   Number of Visits 27   Date for PT Re-Evaluation 08/12/14   PT Start Time 1022   PT Stop Time 1100   PT Time Calculation (min) 38 min      Past Medical History  Diagnosis Date  . Hypertension   . Hyperlipidemia   . Diabetes mellitus without complication   . Thyroid disease     hypothyroidism  . Anemia   . Former smoker   . Multinodular goiter   . Complication of anesthesia     slow to wake up  . Cancer     right breast  . Wears glasses   . Full dentures   . Dysrhythmia 10/15    AF  . Breast cancer 10/2013    right upper outer  . Atrial fibrillation     Past Surgical History  Procedure Laterality Date  . Abdominal hysterectomy    . Portacath placement N/A 11/15/2013    Procedure: INSERTION PORT-A-CATH WITH ULTRA SOUND AND FLOROSCOPY;  Surgeon: [name], MD;  Location: [facility];  Service: General;  Laterality: N/A;  . Eye surgery  12/22/2009    cataracts  . Total knee arthroplasty  2002    left  . Port-a-cath removal Right 01/09/2014    Procedure: REMOVAL PORT-A-CATH;  Surgeon: [name], MD;  Location: [facility];  Service: General;  Laterality: Right;  . Breast lumpectomy with radioactive seed localization Right 01/09/2014    Procedure: RIGHT BREAST SEED LOCALIZED LUMPECTOMY ;  Surgeon: [name], MD;  Location: [facility];  Service: General;  Laterality: Right;  . Axillary lymph node dissection Right 01/09/2014    Procedure: RIGHT AXILLARY LYMPH NODE DISECTION;  Surgeon: [name], MD;  Location: [facility];   Service: General;  Laterality: Right;  . Intramedullary (im) nail intertrochanteric Left 02/24/2014    Procedure: IM ROD LEFT HIP FX;  Surgeon: Alta Corning, MD;  Location: [facility];  Service: Orthopedics;  Laterality: Left;    There were no vitals filed for this visit.  Visit Diagnosis:  Lymphedema of upper extremity      Subjective Assessment - 07/16/14 1027    Subjective Bandages lasted until Saturday and have worn the thick stockinette since then. Arm is doing good.   Currently in Pain? No/denies                         [facility] Adult PT Treatment/Exercise - 07/16/14 0001    Manual Therapy   Manual Lymphatic Drainage (MLD) In supine, short neck, left axilla and anterior interaxillary anastomosis, right groin and axillo-inguinal anastomosis, and right UE from fingers to shoulder.   Compression Bandaging Biotone lotion applied, medium tg soft to arm, elastomull to fingers 1-4,  Artiflex x1 with foam at antecubital fossa, 1-6 cm and 2-10 cm short stretch compression bandages hand to axilla.,                   Short Term Clinic Goals - 07/04/14 1120    CC Short Term Goal  #1   Title Pt.  will be knowledgeable about lymphedema risk reduction.   Status Achieved             Long Term Clinic Goals - 07/13/14 1226    CC Long Term Goal  #1   Title Circumference at 10 cm. proximal to right ulnar styloid reduced by 2 cm.   Status Achieved   CC Long Term Goal  #2   Title Will be knowledgeable about where/how to obtain compression sleeve and glove; also about other equipment available to help manage swelling.   Status On-going   CC Long Term Goal  #3   Title Will be knowledgeable about self-care for lymphedema, including manual lymph drainage.   Status On-going            Plan - 07/16/14 1100    Clinical Impression Statement Pts arm is maintaining very well and she is tolerating the bandaging well, no redness/irritation on skin. Did have small increase  of fluid at medial forearm proximal to antecubital fossa, but this decreased some with manual lymph drainage.    Pt will benefit from skilled therapeutic intervention in order to improve on the following deficits Increased edema;Decreased knowledge of precautions   Rehab Potential Excellent   Clinical Impairments Affecting Rehab Potential 81 yo with diffuse muscle atophy   PT Frequency 2x / week   PT Duration 4 weeks   PT Treatment/Interventions Manual lymph drainage;Compression bandaging;Patient/family education;DME Instruction   PT Next Visit Plan Remeasure next visit and assess goals; Continue with CDT including bandaging with patient removing bandages one day prior to returning.  Check with Hoyle Sauer about when she will measure pt and make sure she has been bandaged prior to measurement appt.   Consulted and Agree with Plan of Care Patient        Problem List Patient Active Problem List   Diagnosis Date Noted  . Acute blood loss anemia 02/25/2014  . Closed left hip fracture 02/24/2014  . Breast cancer of upper-outer quadrant of left female breast 02/14/2014  . PAF (paroxysmal atrial fibrillation) 01/30/2014  . Anticoagulated 01/30/2014  . Chronic diastolic CHF (congestive heart failure), NYHA class 2 12/10/2013  . Preoperative cardiovascular examination 12/10/2013  . Neutropenic fever 12/08/2013  . Anemia of chronic disease 12/08/2013  . Thrombocytopenia 12/08/2013  . Hypokalemia 12/08/2013  . Pancytopenia due to antineoplastic chemotherapy 12/08/2013  . Hypothyroidism 12/08/2013  . Breast cancer of upper-outer quadrant of right female breast 11/01/2013  . Essential hypertension, benign 12/01/2012  . Pure hypercholesterolemia 12/01/2012  . DM2 (diabetes mellitus, type 2) 11/21/2012    Otelia Limes, PTA 07/16/2014, 11:04 AM  [place], [place], [address] Phone: [phone]   Fax:   [phone]

## 2014-07-18 ENCOUNTER — Ambulatory Visit: Payer: Medicare Other

## 2014-07-18 DIAGNOSIS — C50411 Malignant neoplasm of upper-outer quadrant of right female breast: Secondary | ICD-10-CM | POA: Diagnosis not present

## 2014-07-18 DIAGNOSIS — I890 Lymphedema, not elsewhere classified: Secondary | ICD-10-CM

## 2014-07-18 NOTE — Therapy (Signed)
[place], [place], [address] Phone: [phone]   Fax:  [phone]  Physical Therapy Treatment  Patient Details  Name: Whitney [name] MRN: [id number] Date of Birth: [birth year] Referring Provider:  [name], MD  Encounter Date: 07/18/2014      PT End of Session - 07/18/14 1214    Visit Number 16   Number of Visits 27   Date for PT Re-Evaluation 08/12/14   PT Start Time 1101   PT Stop Time 1147   PT Time Calculation (min) 46 min      Past Medical History  Diagnosis Date  . Hypertension   . Hyperlipidemia   . Diabetes mellitus without complication   . Thyroid disease     hypothyroidism  . Anemia   . Former smoker   . Multinodular goiter   . Complication of anesthesia     slow to wake up  . Cancer     right breast  . Wears glasses   . Full dentures   . Dysrhythmia 10/15    AF  . Breast cancer 10/2013    right upper outer  . Atrial fibrillation     Past Surgical History  Procedure Laterality Date  . Abdominal hysterectomy    . Portacath placement N/A 11/15/2013    Procedure: INSERTION PORT-A-CATH WITH ULTRA SOUND AND FLOROSCOPY;  Surgeon: [name], MD;  Location: [facility];  Service: General;  Laterality: N/A;  . Eye surgery  12/22/2009    cataracts  . Total knee arthroplasty  2002    left  . Port-a-cath removal Right 01/09/2014    Procedure: REMOVAL PORT-A-CATH;  Surgeon: [name], MD;  Location: [facility];  Service: General;  Laterality: Right;  . Breast lumpectomy with radioactive seed localization Right 01/09/2014    Procedure: RIGHT BREAST SEED LOCALIZED LUMPECTOMY ;  Surgeon: [name], MD;  Location: [facility];  Service: General;  Laterality: Right;  . Axillary lymph node dissection Right 01/09/2014    Procedure: RIGHT AXILLARY LYMPH NODE DISECTION;  Surgeon: [name], MD;  Location: [facility];   Service: General;  Laterality: Right;  . Intramedullary (im) nail intertrochanteric Left 02/24/2014    Procedure: IM ROD LEFT HIP FX;  Surgeon: Alta Corning, MD;  Location: [facility];  Service: Orthopedics;  Laterality: Left;    There were no vitals filed for this visit.  Visit Diagnosis:  Lymphedema of upper extremity      Subjective Assessment - 07/18/14 1201    Subjective My arm is doing really well. Stays down pretty good when I wear the TG soft sleeve. Havent heard from [place].   Currently in Pain? No/denies                         [facility] Adult PT Treatment/Exercise - 07/18/14 0001    Manual Therapy   Manual Lymphatic Drainage (MLD) In supine, short neck, left axilla and anterior interaxillary anastomosis, right groin and axillo-inguinal anastomosis, and right UE from fingers to shoulder.   Compression Bandaging Biotone lotion applied, medium tg soft to arm, elastomull to fingers 1-4,  Artiflex x1 with foam at antecubital fossa, 1-6 cm and 2-10 cm short stretch compression bandages hand to axilla.,                   Short Term Clinic Goals - 07/04/14 1120    CC Short Term Goal  #  1   Title Pt. will be knowledgeable about lymphedema risk reduction.   Status Achieved             Long Term Clinic Goals - 07/18/14 1218    CC Long Term Goal  #2   Title Will be knowledgeable about where/how to obtain compression sleeve and glove; also about other equipment available to help manage swelling.   Status Partially Met   CC Long Term Goal  #3   Title Will be knowledgeable about self-care for lymphedema, including manual lymph drainage.   Status Partially Met            Plan - 07/18/14 1214    Clinical Impression Statement Pt continues do well with tolerating bandaging and her arm is seeming to maintain with wear of TG soft stockinette for now. Hoyle Sauer will be here tomorrow so will see if pt can be added to her schedule.    Pt will benefit from  skilled therapeutic intervention in order to improve on the following deficits Increased edema;Decreased knowledge of precautions   Rehab Potential Excellent   Clinical Impairments Affecting Rehab Potential 81 yo with diffuse muscle atophy   PT Frequency 2x / week   PT Duration 4 weeks   PT Treatment/Interventions Manual lymph drainage;Compression bandaging;Patient/family education;DME Instruction   PT Next Visit Plan Remeasure next visit and assess goals. Going to call Rexford Maus today about possibly measuring pt tomorrow since she will be at our clinic. Continue complete decongestive therapy until pt gets garments.    Consulted and Agree with Plan of Care Patient;Family member/caregiver   Family Member Consulted [name] present for treatment today        Problem List Patient Active Problem List   Diagnosis Date Noted  . Acute blood loss anemia 02/25/2014  . Closed left hip fracture 02/24/2014  . Breast cancer of upper-outer quadrant of left female breast 02/14/2014  . PAF (paroxysmal atrial fibrillation) 01/30/2014  . Anticoagulated 01/30/2014  . Chronic diastolic CHF (congestive heart failure), NYHA class 2 12/10/2013  . Preoperative cardiovascular examination 12/10/2013  . Neutropenic fever 12/08/2013  . Anemia of chronic disease 12/08/2013  . Thrombocytopenia 12/08/2013  . Hypokalemia 12/08/2013  . Pancytopenia due to antineoplastic chemotherapy 12/08/2013  . Hypothyroidism 12/08/2013  . Breast cancer of upper-outer quadrant of right female breast 11/01/2013  . Essential hypertension, benign 12/01/2012  . Pure hypercholesterolemia 12/01/2012  . DM2 (diabetes mellitus, type 2) 11/21/2012    Otelia Limes, PTA 07/18/2014, 12:18 PM  [place], [place], [address] Phone: [phone]   Fax:  [phone]

## 2014-07-20 ENCOUNTER — Telehealth: Payer: Self-pay | Admitting: Hematology and Oncology

## 2014-07-20 ENCOUNTER — Ambulatory Visit (HOSPITAL_BASED_OUTPATIENT_CLINIC_OR_DEPARTMENT_OTHER): Payer: Medicare Other | Admitting: Hematology and Oncology

## 2014-07-20 ENCOUNTER — Encounter: Payer: Medicare Other | Admitting: Physical Therapy

## 2014-07-20 VITALS — BP 134/58 | HR 56 | Temp 98.2°F | Resp 18 | Ht 66.0 in | Wt 184.1 lb

## 2014-07-20 DIAGNOSIS — S72002A Fracture of unspecified part of neck of left femur, initial encounter for closed fracture: Secondary | ICD-10-CM | POA: Diagnosis not present

## 2014-07-20 DIAGNOSIS — C773 Secondary and unspecified malignant neoplasm of axilla and upper limb lymph nodes: Secondary | ICD-10-CM

## 2014-07-20 DIAGNOSIS — C50411 Malignant neoplasm of upper-outer quadrant of right female breast: Secondary | ICD-10-CM

## 2014-07-20 MED ORDER — TAMOXIFEN CITRATE 20 MG PO TABS: 20.0000 mg | ORAL_TABLET | Freq: Every day | ORAL | Status: DC

## 2014-07-20 NOTE — Telephone Encounter (Signed)
Appointments made and avs printed for patient °

## 2014-07-20 NOTE — Progress Notes (Signed)
Patient Care Team: [name], MD as PCP - General (Family Medicine) Nicholas Lose, MD as Consulting Physician (Hematology and Oncology) [name], MD as Consulting Physician (General Surgery) [name], MD as Consulting Physician (Radiation Oncology)  DIAGNOSIS: Breast cancer of upper-outer quadrant of right female breast   Staging form: Breast, AJCC 7th Edition     Clinical: Stage IIB (T2, N1, cM0) - Signed by [name], MD on 11/01/2013     Pathologic: Stage IIIA (T1c, N2a, cM0) - Unsigned   SUMMARY OF ONCOLOGIC HISTORY:   Breast cancer of upper-outer quadrant of right female breast   10/20/2013 Mammogram Ultrasound and mammogram showed 2.1 x 2.1 x 1.9 cm right breast mass   10/20/2013 Initial Diagnosis Breast cancer of upper-outer quadrant of right female breast. Invasive ductal cancer with lymphovascular invasion one lymph node biopsy that was positive for cancer grade 1; Her 2 Neg Ratio 0.96, ER100% PR 8% positive Ki 67: 11%;   10/31/2013 Breast MRI Right breast upper outer quadrant: 3.1 x 1.9 x 2.2 cm: Right axillary lymph node 1.1 x 0.9 x 0.6 cm   11/16/2013 - 12/01/2013 Chemotherapy Neoadjuvant dose dense Doxorubicin and Cyclophosphamide given on day 1 of a 14 day cycle with Neulasta given on day 2 for granulocyte support   01/09/2014 Surgery Right breast lumpectomy: Invasive ductal carcinoma, grade 2, 1.8 cm with DCIS intermediate grade, lymphovascular invasion diffusely, 5 of 13 lymph nodes positive with extracapsular extension, ER positive, PR 80%, HER-2 negative ratio 0.96, Ki-67 11%   02/25/2014 - 02/28/2014 Hospital Admission Left hip fracture as a result of a fall at home   05/15/2014 - 06/29/2014 Radiation Therapy Adjuvant radiation therapy with Dr. Pablo Ledger   07/20/2014 -  Anti-estrogen oral therapy Tamoxifen 20 mg daily    CHIEF COMPLIANT: Follow-up after radiation therapy  INTERVAL HISTORY: Whitney [name] is a 81 year old lady with above-mentioned history of  right-sided breast cancer treated with neoadjuvant chemotherapy but developed complications and it was discontinued. She underwent right breast lumpectomy followed by radiation therapy. She had ER/PR positive HER-2 negative disease. After the lumpectomy she had a left hip fracture the delayed radiation. She finally finished radiation therapy April 15. She is here today to start antiestrogen therapy. She is doing much better and is recovering very well from it. She is now on anticoagulation with Eliquis.  REVIEW OF SYSTEMS:   Constitutional: Denies fevers, chills or abnormal weight loss Eyes: Denies blurriness of vision Ears, nose, mouth, throat, and face: Denies mucositis or sore throat Respiratory: Denies cough, dyspnea or wheezes Cardiovascular: Denies palpitation, chest discomfort or lower extremity swelling Gastrointestinal:  Denies nausea, heartburn or change in bowel habits Skin: Denies abnormal skin rashes Lymphatics: Denies new lymphadenopathy or easy bruising Neurological:Denies numbness, tingling or new weaknesses Behavioral/Psych: Mood is stable, no new changes  Breast:  denies any pain or lumps or nodules in either breasts All other systems were reviewed with the patient and are negative.  I have reviewed the past medical history, past surgical history, social history and family history with the patient and they are unchanged from previous note.  ALLERGIES:  has No Known Allergies.  MEDICATIONS:  Current Outpatient Prescriptions  Medication Sig Dispense Refill  . amiodarone (PACERONE) 200 MG tablet TAKE 2 TABS TWICE DAILY FOR 1 WEEK, THEN 1 TAB TWICE A DAY FOR 1 WEEK, THEN 1 TAB DAILY 30 tablet 1  . amLODipine (NORVASC) 5 MG tablet Take 5 mg by mouth daily.  5  .  apixaban (ELIQUIS) 5 MG TABS tablet Take 1 tablet (5 mg total) by mouth 2 (two) times daily. 60 tablet 11  . atorvastatin (LIPITOR) 10 MG tablet Take 10 mg by mouth daily.    Marland Kitchen atorvastatin (LIPITOR) 10 MG tablet TAKE  1 TABLET (10 MG TOTAL) BY MOUTH DAILY. 90 tablet 1  . Cholecalciferol (VITAMIN D) 2000 UNITS CAPS Take 2,000 Units by mouth daily.    Marland Kitchen docusate sodium 100 MG CAPS Take 100 mg by mouth 2 (two) times daily. 10 capsule 0  . ferrous sulfate 325 (65 FE) MG EC tablet Take 325 mg by mouth daily with breakfast.    . furosemide (LASIX) 40 MG tablet Take 1 tablet (40 mg total) by mouth daily. 30 tablet   . HYDROcodone-acetaminophen (NORCO) 5-325 MG per tablet Take 1-2 tablets by mouth every 6 (six) hours as needed for moderate pain. 60 tablet 0  . lisinopril-hydrochlorothiazide (PRINZIDE,ZESTORETIC) 20-12.5 MG per tablet Take 1 tablet by mouth daily.  5  . metFORMIN (GLUCOPHAGE-XR) 500 MG 24 hr tablet   0  . metFORMIN (GLUCOPHAGE-XR) 500 MG 24 hr tablet TAKE 1 TABLET EVERY MORNING AND 2 TABLETS IN THE EVENING 270 tablet 1  . metoprolol (LOPRESSOR) 100 MG tablet TAKE 1 TABLET BY MOUTH TWICE A DAY 60 tablet 3  . pioglitazone (ACTOS) 30 MG tablet TAKE 1 TABLET BY MOUTH EVERY DAY 90 tablet 1  . Polyethyl Glycol-Propyl Glycol (SYSTANE OP) Place 1 drop into both eyes 3 (three) times daily.    Marland Kitchen SYNTHROID 50 MCG tablet TAKE 1 TABLET EVERY DAY BEFORE BREAKFAST 90 tablet 1  . vitamin B-12 (CYANOCOBALAMIN) 1000 MCG tablet Take 1,000 mcg by mouth daily.    . tamoxifen (NOLVADEX) 20 MG tablet Take 1 tablet (20 mg total) by mouth daily. 90 tablet 3   No current facility-administered medications for this visit.    PHYSICAL EXAMINATION: ECOG PERFORMANCE STATUS: 1 - Symptomatic but completely ambulatory  Filed Vitals:   07/20/14 1208  BP: 134/58  Pulse: 56  Temp: 98.2 F (36.8 C)  Resp: 18   Filed Weights   07/20/14 1208  Weight: 184 lb 1.6 oz (83.507 kg)    GENERAL:alert, no distress and comfortable SKIN: skin color, texture, turgor are normal, no rashes or significant lesions EYES: normal, Conjunctiva are pink and non-injected, sclera clear OROPHARYNX:no exudate, no erythema and lips, buccal mucosa,  and tongue normal  NECK: supple, thyroid normal size, non-tender, without nodularity LYMPH:  no palpable lymphadenopathy in the cervical, axillary or inguinal LUNGS: clear to auscultation and percussion with normal breathing effort HEART: regular rate & rhythm and no murmurs and no lower extremity edema ABDOMEN:abdomen soft, non-tender and normal bowel sounds Musculoskeletal:no cyanosis of digits and no clubbing  NEURO: alert & oriented x 3 with fluent speech, no focal motor/sensory deficits LABORATORY DATA:  I have reviewed the data as listed   Chemistry      Component Value Date/Time   NA 140 03/29/2014 0818   NA 141 12/22/2013 1000   K 3.7 03/29/2014 0818   K 3.1* 12/22/2013 1000   CL 98 03/29/2014 0818   CO2 30 03/29/2014 0818   CO2 30* 12/22/2013 1000   BUN 20 03/29/2014 0818   BUN 26.0 12/22/2013 1000   CREATININE 1.21* 03/29/2014 0818   CREATININE 1.41* 02/26/2014 0615   CREATININE 1.3* 12/22/2013 1000      Component Value Date/Time   CALCIUM 9.3 03/29/2014 0818   CALCIUM 10.0 12/22/2013 1000   ALKPHOS  175* 03/29/2014 0818   ALKPHOS 66 12/22/2013 1000   AST 12 03/29/2014 0818   AST 11 12/22/2013 1000   ALT 10 03/29/2014 0818   ALT 8 12/22/2013 1000   BILITOT 0.6 03/29/2014 0818   BILITOT 0.29 12/22/2013 1000       Lab Results  Component Value Date   WBC 5.2 03/29/2014   HGB 10.6* 03/29/2014   HCT 33.7* 03/29/2014   MCV 88.7 03/29/2014   PLT 329 03/29/2014   NEUTROABS 4.0 03/29/2014    ASSESSMENT & PLAN:  Breast cancer of upper-outer quadrant of right female breast T1 CN2A M0 stage IIIa invasive ductal carcinoma right breast status post lumpectomy 5/13 lymph nodes positive with lymphovascular invasion and extracapsular extension. ER positive PR positive HER-2 negative Ki-67 11%. Patient was hospitalized for left hip fracture 02/25/2014, completed adjuvant radiation therapy.  Tamoxifen counseling:We discussed the risks and benefits of tamoxifen. These  include but not limited to insomnia, hot flashes, mood changes, vaginal dryness, and weight gain. Although rare, serious side effects includingrisk of blood clots were also discussed. We strongly believe that the benefits far outweigh the risks. Patient understands these risks and consented to starting treatment. Planned treatment duration is 5 years.  Recent left hip fracture: I discussed with her that she might have underlying osteoporosis. I recommended doing a bone density test. But she would like to wait on this for little while. Because of the concern for osteoporosis we elected to use tamoxifen instead.  Return to clinic in 3 months to assess toxicity to therapy    No orders of the defined types were placed in this encounter.   The patient has a good understanding of the overall plan. she agrees with it. she will call with any problems that may develop before the next visit here.   [name], MD

## 2014-07-20 NOTE — Assessment & Plan Note (Addendum)
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 ER positive PR positive HER-2 negative Ki-67 11%. Patient was hospitalized for left hip fracture 02/25/2014, completed adjuvant radiation therapy.  Tamoxifen counseling:We discussed the risks and benefits of tamoxifen. These include but not limited to insomnia, hot flashes, mood changes, vaginal dryness, and weight gain. Although rare, serious side effects includingrisk of blood clots were also discussed. We strongly believe that the benefits far outweigh the risks. Patient understands these risks and consented to starting treatment. Planned treatment duration is 5 years.  Recent left hip fracture: I discussed with her that she might have underlying osteoporosis. I recommended doing a bone density test. But she would like to wait on this for little while. Because of the concern for osteoporosis we elected to use tamoxifen instead.  Return to clinic in 3 months to assess toxicity to therapy

## 2014-07-23 ENCOUNTER — Ambulatory Visit (INDEPENDENT_AMBULATORY_CARE_PROVIDER_SITE_OTHER): Payer: Medicare Other | Admitting: Family Medicine

## 2014-07-23 ENCOUNTER — Encounter: Payer: Self-pay | Admitting: Family Medicine

## 2014-07-23 VITALS — BP 122/64 | HR 60 | Temp 99.1°F | Resp 16 | Ht 67.0 in | Wt 185.0 lb

## 2014-07-23 DIAGNOSIS — Z0000 Encounter for general adult medical examination without abnormal findings: Secondary | ICD-10-CM

## 2014-07-23 DIAGNOSIS — E785 Hyperlipidemia, unspecified: Secondary | ICD-10-CM | POA: Diagnosis not present

## 2014-07-23 DIAGNOSIS — I482 Chronic atrial fibrillation, unspecified: Secondary | ICD-10-CM

## 2014-07-23 DIAGNOSIS — I10 Essential (primary) hypertension: Secondary | ICD-10-CM

## 2014-07-23 DIAGNOSIS — Z23 Encounter for immunization: Secondary | ICD-10-CM

## 2014-07-23 DIAGNOSIS — E119 Type 2 diabetes mellitus without complications: Secondary | ICD-10-CM

## 2014-07-23 DIAGNOSIS — E038 Other specified hypothyroidism: Secondary | ICD-10-CM | POA: Diagnosis not present

## 2014-07-23 NOTE — Progress Notes (Signed)
Subjective:     Patient ID: Whitney [name], female   DOB: Feb 20, 1928, 81 y.o.   MRN: [id number]  HPI   patient is here today for complete physical exam. Due to her age she no longer requires a colonoscopy or a Pap smear. She is currently undergoing treatment for breast cancer. At the present time she is on tamoxifen for prevention. Otherwise she is doing well. She does have +1 edema in both legs as well as lymphedema in her right arm. She does have diastolic heart failure with NYHA class II symptoms. I believe this might be exacerbated by her pioglitazone which she takes for diabetes. She denies any chest pain. She does complain of decreased hearing and on examination today she has bilateral cerumen impactions. She has had Pneumovax 23 but is due for Prevnar 13. She is also due for the shingles vaccine.  Past Medical History  Diagnosis Date  . Hypertension   . Hyperlipidemia   . Diabetes mellitus without complication   . Thyroid disease     hypothyroidism  . Anemia   . Former smoker   . Multinodular goiter   . Complication of anesthesia     slow to wake up  . Cancer     right breast  . Wears glasses   . Full dentures   . Dysrhythmia 10/15    AF  . Breast cancer 10/2013    right upper outer  . Atrial fibrillation    Past Surgical History  Procedure Laterality Date  . Abdominal hysterectomy    . Portacath placement N/A 11/15/2013    Procedure: INSERTION PORT-A-CATH WITH ULTRA SOUND AND FLOROSCOPY;  Surgeon: [name], MD;  Location: [facility];  Service: General;  Laterality: N/A;  . Eye surgery  12/22/2009    cataracts  . Total knee arthroplasty  2002    left  . Port-a-cath removal Right 01/09/2014    Procedure: REMOVAL PORT-A-CATH;  Surgeon: [name], MD;  Location: [facility];  Service: General;  Laterality: Right;  . Breast lumpectomy with radioactive seed localization Right 01/09/2014    Procedure: RIGHT BREAST SEED LOCALIZED LUMPECTOMY ;  Surgeon:  [name], MD;  Location: [facility];  Service: General;  Laterality: Right;  . Axillary lymph node dissection Right 01/09/2014    Procedure: RIGHT AXILLARY LYMPH NODE DISECTION;  Surgeon: [name], MD;  Location: [facility];  Service: General;  Laterality: Right;  . Intramedullary (im) nail intertrochanteric Left 02/24/2014    Procedure: IM ROD LEFT HIP FX;  Surgeon: Alta Corning, MD;  Location: [facility];  Service: Orthopedics;  Laterality: Left;   Current Outpatient Prescriptions on File Prior to Visit  Medication Sig Dispense Refill  . amiodarone (PACERONE) 200 MG tablet TAKE 2 TABS TWICE DAILY FOR 1 WEEK, THEN 1 TAB TWICE A DAY FOR 1 WEEK, THEN 1 TAB DAILY 30 tablet 1  . amLODipine (NORVASC) 5 MG tablet Take 5 mg by mouth daily.  5  . apixaban (ELIQUIS) 5 MG TABS tablet Take 1 tablet (5 mg total) by mouth 2 (two) times daily. 60 tablet 11  . atorvastatin (LIPITOR) 10 MG tablet Take 10 mg by mouth daily.    Marland Kitchen atorvastatin (LIPITOR) 10 MG tablet TAKE 1 TABLET (10 MG TOTAL) BY MOUTH DAILY. 90 tablet 1  . Cholecalciferol (VITAMIN D) 2000 UNITS CAPS Take 2,000 Units by mouth daily.    Marland Kitchen docusate sodium 100 MG CAPS Take 100 mg by mouth 2 (  two) times daily. 10 capsule 0  . ferrous sulfate 325 (65 FE) MG EC tablet Take 325 mg by mouth daily with breakfast.    . furosemide (LASIX) 40 MG tablet Take 1 tablet (40 mg total) by mouth daily. 30 tablet   . HYDROcodone-acetaminophen (NORCO) 5-325 MG per tablet Take 1-2 tablets by mouth every 6 (six) hours as needed for moderate pain. 60 tablet 0  . lisinopril-hydrochlorothiazide (PRINZIDE,ZESTORETIC) 20-12.5 MG per tablet Take 1 tablet by mouth daily.  5  . metFORMIN (GLUCOPHAGE-XR) 500 MG 24 hr tablet TAKE 1 TABLET EVERY MORNING AND 2 TABLETS IN THE EVENING 270 tablet 1  . metoprolol (LOPRESSOR) 100 MG tablet TAKE 1 TABLET BY MOUTH TWICE A DAY 60 tablet 3  . pioglitazone (ACTOS) 30 MG tablet TAKE 1 TABLET BY MOUTH  EVERY DAY 90 tablet 1  . Polyethyl Glycol-Propyl Glycol (SYSTANE OP) Place 1 drop into both eyes 3 (three) times daily.    Marland Kitchen SYNTHROID 50 MCG tablet TAKE 1 TABLET EVERY DAY BEFORE BREAKFAST 90 tablet 1  . tamoxifen (NOLVADEX) 20 MG tablet Take 1 tablet (20 mg total) by mouth daily. 90 tablet 3  . vitamin B-12 (CYANOCOBALAMIN) 1000 MCG tablet Take 1,000 mcg by mouth daily.     No current facility-administered medications on file prior to visit.   No Known Allergies History   Social History  . Marital Status: Widowed    Spouse Name: N/A  . Number of Children: 4  . Years of Education: N/A   Occupational History  . Not on file.   Social History Main Topics  . Smoking status: Former Smoker -- 1.00 packs/day for 5 years    Quit date: 11/14/1958  . Smokeless tobacco: Never Used  . Alcohol Use: No  . Drug Use: No  . Sexual Activity: No     Comment: menarche age 77, P23, first birth age 25, no HRT, menopause age 37   Other Topics Concern  . Not on file   Social History Narrative   No family history on file.   Review of Systems  All other systems reviewed and are negative.      Objective:   Physical Exam  Constitutional: She is oriented to person, place, and time. She appears well-developed and well-nourished. No distress.  HENT:  Head: Normocephalic and atraumatic.  Right Ear: External ear normal.  Left Ear: External ear normal.  Nose: Nose normal.  Mouth/Throat: Oropharynx is clear and moist. No oropharyngeal exudate.  Eyes: Conjunctivae and EOM are normal. Pupils are equal, round, and reactive to light. Right eye exhibits no discharge. Left eye exhibits no discharge. No scleral icterus.  Neck: Normal range of motion. Neck supple. No JVD present. No thyromegaly present.  Cardiovascular: Normal rate, normal heart sounds and intact distal pulses.  Exam reveals no gallop and no friction rub.   No murmur heard. Pulmonary/Chest: Effort normal and breath sounds normal. No  respiratory distress. She has no wheezes. She has no rales. She exhibits no tenderness.  Abdominal: Soft. Bowel sounds are normal. She exhibits no distension and no mass. There is no tenderness. There is no rebound and no guarding.  Musculoskeletal: Normal range of motion. She exhibits edema. She exhibits no tenderness.  Lymphadenopathy:    She has no cervical adenopathy.  Neurological: She is alert and oriented to person, place, and time. She has normal reflexes. She displays normal reflexes. No cranial nerve deficit. She exhibits normal muscle tone. Coordination normal.  Skin: Skin is warm.  No rash noted. She is not diaphoretic. No erythema. No pallor.  Psychiatric: She has a normal mood and affect. Her behavior is normal. Judgment and thought content normal.  Vitals reviewed.      Assessment:    Diabetes mellitus type II, controlled - Plan: COMPLETE METABOLIC PANEL WITH GFR, Lipid panel, Hemoglobin A1c  HLD (hyperlipidemia)  Benign essential HTN  Routine general medical examination at a health care facility  Chronic atrial fibrillation - Plan: CBC with Differential/Platelet  Other specified hypothyroidism - Plan: TSH       Plan:      patient's ears were cleaned today using irrigation/lavage. She tolerated well without complication. She received Prevnar 13 today. Blood pressure is excellent. I would like the patient to return fasting for a CBC, CMP, fasting lipid panel , hemoglobin A1c, and TSH. I will titrate her medication to achieve a hemoglobin A1c less than 8 given her age. If her hemoglobin A1c is well below a I will discontinue Actos as this is contributing to her edema. Goal LDL cholesterol is less than 100. The remainder of her preventative care is up-to-date

## 2014-07-23 NOTE — Addendum Note (Signed)
Addended by: Shary Decamp B on: 07/23/2014 04:09 PM   Modules accepted: Orders

## 2014-07-25 ENCOUNTER — Ambulatory Visit: Payer: Medicare Other

## 2014-07-25 DIAGNOSIS — C50411 Malignant neoplasm of upper-outer quadrant of right female breast: Secondary | ICD-10-CM | POA: Diagnosis not present

## 2014-07-25 DIAGNOSIS — I890 Lymphedema, not elsewhere classified: Secondary | ICD-10-CM | POA: Diagnosis not present

## 2014-07-25 NOTE — Therapy (Signed)
[place], [place], [address] Phone: [phone]   Fax:  [phone]  Physical Therapy Treatment  Patient Details  Name: Whitney [name] MRN: [id number] Date of Birth: Nov 27, 1927 Referring Provider:  [name], MD  Encounter Date: 07/25/2014      PT End of Session - 07/25/14 1549    Visit Number 17   Number of Visits 27   Date for PT Re-Evaluation 08/12/14   PT Start Time 1962   PT Stop Time 1522   PT Time Calculation (min) 45 min      Past Medical History  Diagnosis Date  . Hypertension   . Hyperlipidemia   . Diabetes mellitus without complication   . Thyroid disease     hypothyroidism  . Anemia   . Former smoker   . Multinodular goiter   . Complication of anesthesia     slow to wake up  . Cancer     right breast  . Wears glasses   . Full dentures   . Dysrhythmia 10/15    AF  . Breast cancer 10/2013    right upper outer  . Atrial fibrillation     Past Surgical History  Procedure Laterality Date  . Abdominal hysterectomy    . Portacath placement N/A 11/15/2013    Procedure: INSERTION PORT-A-CATH WITH ULTRA SOUND AND FLOROSCOPY;  Surgeon: [name], MD;  Location: [facility];  Service: General;  Laterality: N/A;  . Eye surgery  12/22/2009    cataracts  . Total knee arthroplasty  2002    left  . Port-a-cath removal Right 01/09/2014    Procedure: REMOVAL PORT-A-CATH;  Surgeon: [name], MD;  Location: [place] [facility];  Service: General;  Laterality: Right;  . Breast lumpectomy with radioactive seed localization Right 01/09/2014    Procedure: RIGHT BREAST SEED LOCALIZED LUMPECTOMY ;  Surgeon: [name], MD;  Location: [place];  Service: General;  Laterality: Right;  . Axillary lymph node dissection Right 01/09/2014    Procedure: RIGHT AXILLARY LYMPH NODE DISECTION;  Surgeon: [name], MD;  Location: [place];   Service: General;  Laterality: Right;  . Intramedullary (im) nail intertrochanteric Left 02/24/2014    Procedure: IM ROD LEFT HIP FX;  Surgeon: Alta Corning, MD;  Location: [facility];  Service: Orthopedics;  Laterality: Left;    There were no vitals filed for this visit.  Visit Diagnosis:  Lymphedema of upper extremity      Subjective Assessment - 07/25/14 1445    Subjective I got measured for my compression garment and it should be here in 2 weeks., My arm is doing fine.    Currently in Pain? No/denies               LYMPHEDEMA/ONCOLOGY QUESTIONNAIRE - 07/25/14 1446    Right Upper Extremity Lymphedema   10 cm Proximal to Olecranon Process 29.8 cm   Olecranon Process 26.2 cm   15 cm Proximal to Ulnar Styloid Process 24.5 cm   10 cm Proximal to Ulnar Styloid Process 21.6 cm   Just Proximal to Ulnar Styloid Process 17.6 cm   Across Hand at [employer] 19 cm   At [place] of 2nd Digit 6.8 cm                  [facility] Adult PT Treatment/Exercise - 07/25/14 0001    Manual Therapy   Manual Lymphatic Drainage (MLD) In supine, short neck, left  axilla and anterior interaxillary anastomosis, right groin and axillo-inguinal anastomosis, and right UE from fingers to shoulder.   Compression Bandaging --   Other Manual Therapy Did not bandage pt today per her request. Did however instruct daughter Jollette in how to measure her mom so they can see if her circumference increased from today and if so she will be back for bandaging next week so she will fit into her compression garment when it arrives, if measurements are ok pt on hold until garment arrives.                PT Education - 07/25/14 1548    Education Details             Short Term Clinic Goals - 07/04/14 1120    CC Short Term Goal  #1   Title Pt. will be knowledgeable about lymphedema risk reduction.   Status Achieved             Long Term Clinic Goals - 07/25/14 1554    CC Long Term Goal  #2    Title Will be knowledgeable about where/how to obtain compression sleeve and glove; also about other equipment available to help manage swelling.  Pt has been measured for compression garments.   Status Achieved   CC Long Term Goal  #3   Title Will be knowledgeable about self-care for lymphedema, including manual lymph drainage.  Daughter Sherri Rad has been instructed in this.   Status Achieved            Plan - 07/25/14 1550    Clinical Impression Statement Pts circumference measurements had increased at some areas today, but she really didnt want to be bandage again so instructed pts daugther how to measure Rt UE so they will know when if she does need to mesured again before garment arrives so it will fit.   Pt will benefit from skilled therapeutic intervention in order to improve on the following deficits Increased edema;Decreased knowledge of precautions   Rehab Potential Excellent   Clinical Impairments Affecting Rehab Potential 81 yo with diffuse muscle atophy   PT Frequency 2x / week   PT Duration 4 weeks   PT Treatment/Interventions Manual lymph drainage;Compression bandaging;Patient/family education;DME Instruction   PT Next Visit Plan Pt possibly on hold until garment arrives her circumference doesnt flare up.    Consulted and Agree with Plan of Care Patient;Family member/caregiver   Family Member Consulted [name] present for treatment today        Problem List Patient Active Problem List   Diagnosis Date Noted  . Acute blood loss anemia 02/25/2014  . Closed left hip fracture 02/24/2014  . PAF (paroxysmal atrial fibrillation) 01/30/2014  . Anticoagulated 01/30/2014  . Chronic diastolic CHF (congestive heart failure), NYHA class 2 12/10/2013  . Preoperative cardiovascular examination 12/10/2013  . Neutropenic fever 12/08/2013  . Anemia of chronic disease 12/08/2013  . Thrombocytopenia 12/08/2013  . Hypokalemia 12/08/2013  . Pancytopenia due to  antineoplastic chemotherapy 12/08/2013  . Hypothyroidism 12/08/2013  . Breast cancer of upper-outer quadrant of right female breast 11/01/2013  . Essential hypertension, benign 12/01/2012  . Pure hypercholesterolemia 12/01/2012  . DM2 (diabetes mellitus, type 2) 11/21/2012    Otelia Limes, PTA 07/25/2014, 3:56 PM  [place], [place], [address] Phone: [phone]   Fax:  [phone]

## 2014-07-27 ENCOUNTER — Other Ambulatory Visit: Payer: Medicare Other

## 2014-07-27 DIAGNOSIS — E119 Type 2 diabetes mellitus without complications: Secondary | ICD-10-CM | POA: Diagnosis not present

## 2014-07-27 DIAGNOSIS — E038 Other specified hypothyroidism: Secondary | ICD-10-CM | POA: Diagnosis not present

## 2014-07-27 DIAGNOSIS — I482 Chronic atrial fibrillation: Secondary | ICD-10-CM | POA: Diagnosis not present

## 2014-07-27 LAB — LIPID PANEL
Cholesterol: 179 mg/dL (ref 0–200)
HDL: 74 mg/dL (ref >=46)
LDL Cholesterol: 91 mg/dL (ref 0–99)
Total CHOL/HDL Ratio: 2.4 Ratio
Triglycerides: 68 mg/dL (ref <150)
VLDL: 14 mg/dL (ref 0–40)

## 2014-07-27 LAB — COMPLETE METABOLIC PANEL WITH GFR
ALT: 11 U/L (ref 0–35)
AST: 14 U/L (ref 0–37)
Albumin: 3.5 g/dL (ref 3.5–5.2)
Alkaline Phosphatase: 91 U/L (ref 39–117)
BUN: 28 mg/dL — ABNORMAL HIGH (ref 6–23)
CO2: 31 mEq/L (ref 19–32)
Calcium: 9.5 mg/dL (ref 8.4–10.5)
Chloride: 100 mEq/L (ref 96–112)
Creat: 1.2 mg/dL — ABNORMAL HIGH (ref 0.50–1.10)
GFR, Est African American: 48 mL/min — ABNORMAL LOW
GFR, Est Non African American: 42 mL/min — ABNORMAL LOW
Glucose, Bld: 129 mg/dL — ABNORMAL HIGH (ref 70–99)
Potassium: 3.6 mEq/L (ref 3.5–5.3)
Sodium: 142 mEq/L (ref 135–145)
Total Bilirubin: 0.5 mg/dL (ref 0.2–1.2)
Total Protein: 6.2 g/dL (ref 6.0–8.3)

## 2014-07-27 LAB — CBC WITH DIFFERENTIAL/PLATELET
Basophils Absolute: 0 10*3/uL (ref 0.0–0.1)
Basophils Relative: 0 % (ref 0–1)
Eosinophils Absolute: 0 10*3/uL (ref 0.0–0.7)
Eosinophils Relative: 0 % (ref 0–5)
HCT: 31 % — ABNORMAL LOW (ref 36.0–46.0)
Hemoglobin: 10 g/dL — ABNORMAL LOW (ref 12.0–15.0)
Lymphocytes Relative: 13 % (ref 12–46)
Lymphs Abs: 0.6 10*3/uL — ABNORMAL LOW (ref 0.7–4.0)
MCH: 28.1 pg (ref 26.0–34.0)
MCHC: 32.3 g/dL (ref 30.0–36.0)
MCV: 87.1 fL (ref 78.0–100.0)
MPV: 9.6 fL (ref 8.6–12.4)
Monocytes Absolute: 0.4 10*3/uL (ref 0.1–1.0)
Monocytes Relative: 10 % (ref 3–12)
Neutro Abs: 3.3 10*3/uL (ref 1.7–7.7)
Neutrophils Relative %: 77 % (ref 43–77)
Platelets: 203 10*3/uL (ref 150–400)
RBC: 3.56 MIL/uL — ABNORMAL LOW (ref 3.87–5.11)
RDW: 17 % — ABNORMAL HIGH (ref 11.5–15.5)
WBC: 4.3 10*3/uL (ref 4.0–10.5)

## 2014-07-27 LAB — HEMOGLOBIN A1C
Hgb A1c MFr Bld: 6.5 % — ABNORMAL HIGH (ref <5.7)
Mean Plasma Glucose: 140 mg/dL — ABNORMAL HIGH (ref <117)

## 2014-07-27 LAB — TSH: TSH: 0.249 u[IU]/mL — ABNORMAL LOW (ref 0.350–4.500)

## 2014-07-31 ENCOUNTER — Ambulatory Visit: Payer: Medicare Other

## 2014-08-01 NOTE — Progress Notes (Signed)
   Department of Radiation Oncology  Phone:  [phone] Fax:        [phone]   Name: Whitney [name] MRN: [id number]  DOB: 1927/09/15  Date: 08/02/2014  Follow Up Visit Note  Diagnosis: Breast cancer of upper-outer quadrant of right female breast   Staging form: Breast, AJCC 7th Edition     Clinical: Stage IIB (T2, N1, cM0) - Signed by [name], MD on 11/01/2013     Pathologic: Stage IIIA (T1c, N2a, cM0) - Unsigned   Summary and Interval since last radiation: 06/28/2014 Right breast / 45 Gray @ 1.8 Pearline Cables per fraction x 25 fractions Right supraclavicular fossa and axilla/ 45 Gy @1 .8 Gy per fraction x 25 fractions Right breast boost / 16 Gray at [employer] per fraction x 8 fractions  Interval History: Whitney [name] presents today for routine followup. She denies pain. Pt states her skin has healed completely with just some mild discoloration. She has started tamoxifen on 07/28/14. Pt states she is using the RadiaPlex cream provided for her skin. She has ordered a lymphedema sleeve and is waiting for it to arrive.  Physical Exam:  Filed Vitals:   08/02/14 1355  BP: 145/59  Pulse: 64  Temp: 98.5 F (36.9 C)  Weight: 187 lb 4.8 oz (84.959 kg)  Hyperpigmentation of the right breast with hypopigmentation at the area where her adhesive marker was placed.  IMPRESSION: Whitney [name] is a 81 y.o. female with Stage IIIA (T1c, N2a, cM0), status post breast conservation with resolving acute affects and treatment  PLAN:  Pt should continue to follow up with Dr. Lindi Adie in 3 months. I will order a mammogram before she see's Dr. Lindi Adie. I advised the patient to exhibit good sun protection and to apply vitamin E lotion to the site. I will follow her on a prn basis.  This document serves as a record of services personally performed by [name], MD. It was created on her behalf by Darcus Austin, a trained medical scribe. The creation of this record is based on the scribe's personal observations  and the provider's statements to them. This document has been checked and approved by the attending provider.   [name], MD

## 2014-08-02 ENCOUNTER — Ambulatory Visit
Admission: RE | Admit: 2014-08-02 | Discharge: 2014-08-02 | Disposition: A | Discharge status: Home / Self Care | Payer: Medicare Other | Source: Ambulatory Visit | Attending: Radiation Oncology | Admitting: Radiation Oncology

## 2014-08-02 VITALS — BP 145/59 | HR 64 | Temp 98.5°F | Wt 187.3 lb

## 2014-08-02 DIAGNOSIS — C50412 Malignant neoplasm of upper-outer quadrant of left female breast: Secondary | ICD-10-CM

## 2014-08-02 NOTE — Progress Notes (Signed)
Routine one month follow up radiation to right breast.Denies pain.Skin has healed completely just mild discoloration.To start lotion with vitamin e.Started tamoxifen on 07/28/14.

## 2014-08-06 ENCOUNTER — Encounter: Payer: Self-pay | Admitting: Physical Therapy

## 2014-08-06 ENCOUNTER — Ambulatory Visit: Payer: Medicare Other | Admitting: Physical Therapy

## 2014-08-06 DIAGNOSIS — I890 Lymphedema, not elsewhere classified: Secondary | ICD-10-CM

## 2014-08-06 DIAGNOSIS — C50411 Malignant neoplasm of upper-outer quadrant of right female breast: Secondary | ICD-10-CM | POA: Diagnosis not present

## 2014-08-06 NOTE — Therapy (Signed)
[place], [place], [address] Phone: [phone]   Fax:  [phone]  Physical Therapy Treatment  Patient Details  Name: Whitney [name] MRN: [id number] Date of Birth: May 28, 1927 Referring Provider:  [name], MD  Encounter Date: 08/06/2014      PT End of Session - 08/06/14 1620    Visit Number 18   Number of Visits 27   Date for PT Re-Evaluation 08/12/14   PT Start Time 1510   PT Stop Time 1616   PT Time Calculation (min) 66 min   Activity Tolerance Patient tolerated treatment well   Behavior During Therapy [facility] for tasks assessed/performed      Past Medical History  Diagnosis Date  . Hypertension   . Hyperlipidemia   . Diabetes mellitus without complication   . Thyroid disease     hypothyroidism  . Anemia   . Former smoker   . Multinodular goiter   . Complication of anesthesia     slow to wake up  . Cancer     right breast  . Wears glasses   . Full dentures   . Dysrhythmia 10/15    AF  . Breast cancer 10/2013    right upper outer  . Atrial fibrillation   . Radiation     Right Breast    Past Surgical History  Procedure Laterality Date  . Abdominal hysterectomy    . Portacath placement N/A 11/15/2013    Procedure: INSERTION PORT-A-CATH WITH ULTRA SOUND AND FLOROSCOPY;  Surgeon: [name], MD;  Location: [place];  Service: General;  Laterality: N/A;  . Eye surgery  12/22/2009    cataracts  . Total knee arthroplasty  2002    left  . Port-a-cath removal Right 01/09/2014    Procedure: REMOVAL PORT-A-CATH;  Surgeon: [name], MD;  Location: [place];  Service: General;  Laterality: Right;  . Breast lumpectomy with radioactive seed localization Right 01/09/2014    Procedure: RIGHT BREAST SEED LOCALIZED LUMPECTOMY ;  Surgeon: [name], MD;  Location: [place];  Service: General;  Laterality: Right;  . Axillary lymph node  dissection Right 01/09/2014    Procedure: RIGHT AXILLARY LYMPH NODE DISECTION;  Surgeon: [name], MD;  Location: [place];  Service: General;  Laterality: Right;  . Intramedullary (im) nail intertrochanteric Left 02/24/2014    Procedure: IM ROD LEFT HIP FX;  Surgeon: Alta Corning, MD;  Location: [place];  Service: Orthopedics;  Laterality: Left;    There were no vitals filed for this visit.  Visit Diagnosis:  Lymphedema of upper extremity - Plan: PT plan of care cert/re-cert      Subjective Assessment - 08/06/14 1514    Subjective I feel like my arm started swelling more the end of last week and I felt like I should come back in.   Patient is accompained by: Family member   Pertinent History Right breast cancer diagnosed 10/23/13; had neo-adjuvant chemotherapy but developed atrial fibrillation and was hospitalized.  Had lumpectomy in August; 5 lymph nodes removed.  [place] and broke hip in December 2015 and had rod placed (left side).  Now doing radiation, which is due to end June 27, 2014.  Diabetes, HTN, atrial fibrillation--on meds for these.   Patient Stated Goals reduce the swelling   Currently in Pain? No/denies               LYMPHEDEMA/ONCOLOGY QUESTIONNAIRE - 08/06/14 1515  Right Upper Extremity Lymphedema   10 cm Proximal to Olecranon Process 27.9 cm   Olecranon Process 24.1 cm   15 cm Proximal to Ulnar Styloid Process 23.8 cm   10 cm Proximal to Ulnar Styloid Process 20.1 cm   Just Proximal to Ulnar Styloid Process 17.6 cm   Across Hand at [employer] 18.8 cm   At [place] of 2nd Digit 6.9 cm                  [facility] Adult PT Treatment/Exercise - 08/06/14 0001    Manual Therapy   Manual Lymphatic Drainage (MLD) In supine, short neck, left axilla and anterior interaxillary anastomosis, right groin and axillo-inguinal anastomosis, and right UE from fingers to shoulder.   Compression Bandaging Compression bandaging to right UE as follows:  biotone applied to entire arm, peach small dot foam applied to right medial proximal forearm covered by thick stockinette.  Elastomull applied to fingers 1-4; Artiflex applied to entire right arm; 3 Comprilan short stretch compression bandages applied to right hand to axilla.                PT Education - 08/06/14 1619    Education provided Yes   Education Details How to perform manual lymph drainage on self   Person(s) Educated Patient;Child(ren)   Methods Explanation;Demonstration;Verbal cues;Handout   Comprehension Verbalized understanding           Short Term Clinic Goals - 08/06/14 1634    CC Short Term Goal  #1   Title Pt. will be knowledgeable about lymphedema risk reduction.   Time 3   Period Weeks   Status Achieved   CC Short Term Goal  #2   Title Right arm circumference at 10 cm. proximal to ulnar styloid reduced by at least 1 cm.   Baseline 24.4 cm.   Time 3   Period Weeks   Status Achieved             Long Term Clinic Goals - 08/06/14 1635    CC Long Term Goal  #1   Title Circumference at 10 cm. proximal to right ulnar styloid reduced by 2 cm.   Baseline 24.4 cm.   Time 6   Period Weeks   Status Achieved   CC Long Term Goal  #2   Title Will be knowledgeable about where/how to obtain compression sleeve and glove; also about other equipment available to help manage swelling.   Time 6   Period Weeks   Status Achieved   CC Long Term Goal  #3   Title Will be knowledgeable about self-care for lymphedema, including manual lymph drainage.   Baseline 6   Period Weeks   Status Achieved   CC Long Term Goal  #4   Title Patient will demonstrate a reduction of 4.5 cm at 10 cm proximal to her right ulnar styloid process where she originally measured 24.4 cm on 05/23/14 to fit into her compression garments when they arrive.   Time 2   Period Weeks   Status New   CC Long Term Goal  #5   Title Patient's daughter will verbalize that she has good understanding  of performing self manual lymph drainage on her mom to maintain the reduction in swelling and for long term home care.   Time 2   Period Weeks   Status New            Plan - 08/06/14 1621    Clinical Impression Statement  Patient is awaiting delivery of her compression garments.  She called the clinic a few days ago and requested to be seen because she was concerned that her right arm swelling had worsened while waiting on garments.  Right arm measurements were taken and found to not be increased since her last visit but were actually slightly decreaeed.  Phoned Rexford Maus (garment fitter) today to determine when garments will be delivered.  She reported that the wrong sleeve (Elvarex) arrived instead of a what was supposed to be ordered (Soft).    The pt requested to wait for the right sleeve to arrive and Hoyle Sauer hoped it would arrive within 1-2 weeks.  The pateint received manual lymph drainage today and compression bandaging to try to reduce swelling and fibrotic area on medial proximal forearm.  She will benefit from continued PT to try to maintain and further reduce her swelling to ensure her garments fit when they arrive.       Pt will benefit from skilled therapeutic intervention in order to improve on the following deficits Increased edema;Decreased knowledge of precautions   Rehab Potential Excellent   Clinical Impairments Affecting Rehab Potential 81 yo with diffuse muscle atophy   PT Frequency 2x / week   PT Duration 4 weeks   PT Treatment/Interventions Manual lymph drainage;Compression bandaging;Patient/family education;DME Instruction   PT Next Visit Plan Continue complete decongestive therapy until garments arrive.   Consulted and Agree with Plan of Care Patient;Family member/caregiver   Family Member Consulted Her daughter          G-Codes - 08-16-14 1636-06-23    Functional Assessment Tool Used Clinical Judgement   Carrying, Moving and Handling Objects Current Status  [phone]) At least 1 percent but less than 20 percent impaired, limited or restricted   Carrying, Moving and Handling Objects Goal Status ([id number]) 0 percent impaired, limited or restricted      Problem List Patient Active Problem List   Diagnosis Date Noted  . Acute blood loss anemia 02/25/2014  . Closed left hip fracture 02/24/2014  . PAF (paroxysmal atrial fibrillation) 01/30/2014  . Anticoagulated 01/30/2014  . Chronic diastolic CHF (congestive heart failure), NYHA class 2 12/10/2013  . Preoperative cardiovascular examination 12/10/2013  . Neutropenic fever 12/08/2013  . Anemia of chronic disease 12/08/2013  . Thrombocytopenia 12/08/2013  . Hypokalemia 12/08/2013  . Pancytopenia due to antineoplastic chemotherapy 12/08/2013  . Hypothyroidism 12/08/2013  . Breast cancer of upper-outer quadrant of right female breast 11/01/2013  . Essential hypertension, benign 12/01/2012  . Pure hypercholesterolemia 12/01/2012  . DM2 (diabetes mellitus, type 2) 11/21/2012    Annia Friendly, PT August 16, 2014, 4:42 PM  [place], [place], [address] Phone: [phone]   Fax:  [phone]

## 2014-08-06 NOTE — Patient Instructions (Signed)
Instructed pt's daughter with performing manual lymph drainage to right arm.  Issued handout and loaned video to them to watch to reinforce what they've previously learned.  Gave verbal instruction as I performed manual lymph drainage and her daughter verbalized good understanding as she has previously done this in the clinic with another therapist.

## 2014-08-08 ENCOUNTER — Ambulatory Visit: Payer: Medicare Other

## 2014-08-08 DIAGNOSIS — I890 Lymphedema, not elsewhere classified: Secondary | ICD-10-CM

## 2014-08-08 DIAGNOSIS — C50411 Malignant neoplasm of upper-outer quadrant of right female breast: Secondary | ICD-10-CM | POA: Diagnosis not present

## 2014-08-08 NOTE — Therapy (Addendum)
[place], [place], [address] Phone: [phone]   Fax:  [phone]  Physical Therapy Treatment  Patient Details  Name: Whitney [name] MRN: [id number] Date of Birth: [birth year] Referring Provider:  [name], MD  Encounter Date: 08/08/2014      PT End of Session - 08/08/14 1511    Visit Number 19   Number of Visits 27   Date for PT Re-Evaluation 08/12/14   PT Start Time 7322   PT Stop Time 1506   PT Time Calculation (min) 42 min      Past Medical History  Diagnosis Date  . Hypertension   . Hyperlipidemia   . Diabetes mellitus without complication   . Thyroid disease     hypothyroidism  . Anemia   . Former smoker   . Multinodular goiter   . Complication of anesthesia     slow to wake up  . Cancer     right breast  . Wears glasses   . Full dentures   . Dysrhythmia 10/15    AF  . Breast cancer 10/2013    right upper outer  . Atrial fibrillation   . Radiation     Right Breast    Past Surgical History  Procedure Laterality Date  . Abdominal hysterectomy    . Portacath placement N/A 11/15/2013    Procedure: INSERTION PORT-A-CATH WITH ULTRA SOUND AND FLOROSCOPY;  Surgeon: [name], MD;  Location: [facility];  Service: General;  Laterality: N/A;  . Eye surgery  12/22/2009    cataracts  . Total knee arthroplasty  2002    left  . Port-a-cath removal Right 01/09/2014    Procedure: REMOVAL PORT-A-CATH;  Surgeon: [name], MD;  Location: [facility];  Service: General;  Laterality: Right;  . Breast lumpectomy with radioactive seed localization Right 01/09/2014    Procedure: RIGHT BREAST SEED LOCALIZED LUMPECTOMY ;  Surgeon: [name], MD;  Location: [facility];  Service: General;  Laterality: Right;  . Axillary lymph node dissection Right 01/09/2014    Procedure: RIGHT AXILLARY LYMPH NODE DISECTION;  Surgeon: [name], MD;  Location:  [facility];  Service: General;  Laterality: Right;  . Intramedullary (im) nail intertrochanteric Left 02/24/2014    Procedure: IM ROD LEFT HIP FX;  Surgeon: Alta Corning, MD;  Location: [facility];  Service: Orthopedics;  Laterality: Left;    There were no vitals filed for this visit.  Visit Diagnosis:  Lymphedema of upper extremity      Subjective Assessment - 08/08/14 1429    Subjective The bandages really helped my arm feel better.    Currently in Pain? No/denies                         [facility] Adult PT Treatment/Exercise - 08/08/14 0001    Manual Therapy   Manual Lymphatic Drainage (MLD) In supine, short neck, left axilla and anterior interaxillary anastomosis, right groin and axillo-inguinal anastomosis, and right UE from fingers to shoulder.   Compression Bandaging --        And daughter reports hasnt got to watch the DVD yet, but will soon and will return it after.            Short Term Clinic Goals - 08/06/14 1634    CC Short Term Goal  #1   Title Pt. will be knowledgeable about lymphedema risk reduction.   Time 3  Period Weeks   Status Achieved   CC Short Term Goal  #2   Title Right arm circumference at 10 cm. proximal to ulnar styloid reduced by at least 1 cm.   Baseline 24.4 cm.   Time 3   Period Weeks   Status Achieved             Long Term Clinic Goals - 08/06/14 1635    CC Long Term Goal  #1   Title Circumference at 10 cm. proximal to right ulnar styloid reduced by 2 cm.   Baseline 24.4 cm.   Time 6   Period Weeks   Status Achieved   CC Long Term Goal  #2   Title Will be knowledgeable about where/how to obtain compression sleeve and glove; also about other equipment available to help manage swelling.   Time 6   Period Weeks   Status Achieved   CC Long Term Goal  #3   Title Will be knowledgeable about self-care for lymphedema, including manual lymph drainage.   Baseline 6   Period Weeks   Status Achieved    CC Long Term Goal  #4   Title Patient will demonstrate a reduction of 4.5 cm at 10 cm proximal to her right ulnar styloid process where she originally measured 24.4 cm on 05/23/14 to fit into her compression garments when they arrive.   Time 2   Period Weeks   Status New   CC Long Term Goal  #5   Title Patient's daughter will verbalize that she has good understanding of performing self manual lymph drainage on her mom to maintain the reduction in swelling and for long term home care.   Time 2   Period Weeks   Status New            Plan - 08/08/14 1512    Clinical Impression Statement Pts Rt arm swelling was much improved today, and though did not measure, both pt and therapist could visibly and palpably tell an improvement. Her forearm was soft and comparable overall to her Lt UE. Pt would like to be on hold again and continue wearing the TG soft sleeve until compression garments arrive. Will call and make another appt if she has another flare up before then. Pt did not want to be wrapped today as her UE was doing well.   Pt will benefit from skilled therapeutic intervention in order to improve on the following deficits Increased edema;Decreased knowledge of precautions   Rehab Potential Excellent   Clinical Impairments Affecting Rehab Potential 81 yo with diffuse muscle atophy   PT Frequency 2x / week   PT Duration 4 weeks   PT Treatment/Interventions Manual lymph drainage;Compression bandaging;Patient/family education;DME Instruction   PT Next Visit Plan If pt returns continue complete decongestive therapy until garments arrive.   Consulted and Agree with Plan of Care Patient;Family member/caregiver   Family Member Consulted Her daughter, Jolette        Problem List Patient Active Problem List   Diagnosis Date Noted  . Acute blood loss anemia 02/25/2014  . Closed left hip fracture 02/24/2014  . PAF (paroxysmal atrial fibrillation) 01/30/2014  . Anticoagulated 01/30/2014  .  Chronic diastolic CHF (congestive heart failure), NYHA class 2 12/10/2013  . Preoperative cardiovascular examination 12/10/2013  . Neutropenic fever 12/08/2013  . Anemia of chronic disease 12/08/2013  . Thrombocytopenia 12/08/2013  . Hypokalemia 12/08/2013  . Pancytopenia due to antineoplastic chemotherapy 12/08/2013  . Hypothyroidism 12/08/2013  . Breast cancer of  upper-outer quadrant of right female breast 11/01/2013  . Essential hypertension, benign 12/01/2012  . Pure hypercholesterolemia 12/01/2012  . DM2 (diabetes mellitus, type 2) 11/21/2012    Otelia Limes, PTA 08/08/2014, 3:20 PM  [place], [place], [address] Phone: [phone]   Fax:  [phone]     PHYSICAL THERAPY DISCHARGE SUMMARY  Visits from Start of Care: 17  Current functional level related to goals / functional outcomes: See above.  Most goals met.  She was placed on hold to see how she did managing her symptoms on her own and was planning to call us if she needed to return but we have not heard from her.   Remaining deficits: None known   Education / Equipment: Compression garments.  Plan: Patient agrees to discharge.  Patient goals were partially met. Patient is being discharged due to being pleased with the current functional level.  ?????       Annia Friendly, [place] 04/15/2015 10:05 AM

## 2014-08-10 ENCOUNTER — Other Ambulatory Visit: Payer: Self-pay | Admitting: Endocrinology

## 2014-08-16 ENCOUNTER — Other Ambulatory Visit: Payer: Self-pay | Admitting: Endocrinology

## 2014-08-18 ENCOUNTER — Other Ambulatory Visit: Payer: Self-pay | Admitting: Family Medicine

## 2014-08-21 ENCOUNTER — Other Ambulatory Visit: Payer: Self-pay | Admitting: Endocrinology

## 2014-09-06 ENCOUNTER — Other Ambulatory Visit: Payer: Self-pay | Admitting: Endocrinology

## 2014-09-10 ENCOUNTER — Other Ambulatory Visit: Payer: Self-pay

## 2014-09-11 ENCOUNTER — Other Ambulatory Visit: Payer: Self-pay | Admitting: Endocrinology

## 2014-09-13 ENCOUNTER — Other Ambulatory Visit (INDEPENDENT_AMBULATORY_CARE_PROVIDER_SITE_OTHER): Payer: Medicare Other

## 2014-09-13 DIAGNOSIS — E119 Type 2 diabetes mellitus without complications: Secondary | ICD-10-CM

## 2014-09-13 DIAGNOSIS — E278 Other specified disorders of adrenal gland: Secondary | ICD-10-CM

## 2014-09-13 DIAGNOSIS — E279 Disorder of adrenal gland, unspecified: Secondary | ICD-10-CM

## 2014-09-13 LAB — BASIC METABOLIC PANEL
BUN: 41 mg/dL — ABNORMAL HIGH (ref 6–23)
CO2: 34 mEq/L — ABNORMAL HIGH (ref 19–32)
Calcium: 9.5 mg/dL (ref 8.4–10.5)
Chloride: 100 mEq/L (ref 96–112)
Creatinine, Ser: 1.6 mg/dL — ABNORMAL HIGH (ref 0.40–1.20)
GFR: 39.44 mL/min — ABNORMAL LOW (ref >60.00)
Glucose, Bld: 151 mg/dL — ABNORMAL HIGH (ref 70–99)
Potassium: 3.3 mEq/L — ABNORMAL LOW (ref 3.5–5.1)
Sodium: 142 mEq/L (ref 135–145)

## 2014-09-13 LAB — CORTISOL: Cortisol, Plasma: 14.9 ug/dL

## 2014-09-14 ENCOUNTER — Other Ambulatory Visit: Payer: Self-pay

## 2014-09-14 ENCOUNTER — Telehealth: Payer: Self-pay | Admitting: Hematology and Oncology

## 2014-09-14 MED ORDER — METOPROLOL TARTRATE 100 MG PO TABS: 100.0000 mg | ORAL_TABLET | Freq: Two times a day (BID) | ORAL | Status: DC

## 2014-09-14 MED ORDER — AMLODIPINE BESYLATE 5 MG PO TABS: 5.0000 mg | ORAL_TABLET | Freq: Every day | ORAL | Status: DC

## 2014-09-14 NOTE — Telephone Encounter (Signed)
Mailed a new calendar to patient as dr Lindi Adie is out of the office on 8/5

## 2014-09-19 ENCOUNTER — Ambulatory Visit (INDEPENDENT_AMBULATORY_CARE_PROVIDER_SITE_OTHER): Payer: Medicare Other | Admitting: Endocrinology

## 2014-09-19 ENCOUNTER — Encounter: Payer: Self-pay | Admitting: Endocrinology

## 2014-09-19 VITALS — BP 140/68 | HR 60 | Temp 98.0°F | Resp 16 | Ht 67.0 in | Wt 188.6 lb

## 2014-09-19 DIAGNOSIS — R7989 Other specified abnormal findings of blood chemistry: Secondary | ICD-10-CM

## 2014-09-19 DIAGNOSIS — E876 Hypokalemia: Secondary | ICD-10-CM

## 2014-09-19 DIAGNOSIS — E119 Type 2 diabetes mellitus without complications: Secondary | ICD-10-CM | POA: Diagnosis not present

## 2014-09-19 DIAGNOSIS — N289 Disorder of kidney and ureter, unspecified: Secondary | ICD-10-CM

## 2014-09-19 LAB — POCT URINALYSIS DIPSTICK
Bilirubin, UA: NEGATIVE
Blood, UA: NEGATIVE
Glucose, UA: NEGATIVE
Ketones, UA: NEGATIVE
Leukocytes, UA: NEGATIVE
Nitrite, UA: NEGATIVE
Spec Grav, UA: 1.01
Urobilinogen, UA: 0.2
pH, UA: 6.5

## 2014-09-19 LAB — MICROALBUMIN / CREATININE URINE RATIO
Creatinine,U: 59.8 mg/dL
Microalb Creat Ratio: 4.5 mg/g (ref 0.0–30.0)
Microalb, Ur: 2.7 mg/dL — ABNORMAL HIGH (ref 0.0–1.9)

## 2014-09-19 MED ORDER — GLIPIZIDE ER 2.5 MG PO TB24: 2.5000 mg | ORAL_TABLET | Freq: Every day | ORAL | Status: DC

## 2014-09-19 MED ORDER — SPIRONOLACTONE 25 MG PO TABS: 25.0000 mg | ORAL_TABLET | Freq: Every day | ORAL | Status: DC

## 2014-09-19 NOTE — Progress Notes (Signed)
Patient ID: Whitney [name], female   DOB: 1927/12/27, 81 y.o.   MRN: [id number]   Reason for Appointment: Diabetes and other issues  History of Present Illness    Type 2 DIABETES MELITUS, date of diagnosis:  1983     Previous history: She had been taking metformin and Actos for several years.  She usually has had excellent control with upper normal A1c Her metformin was reduced to 1000 mg daily because of her age and borderline renal function Also because of higher blood sugars with stopping her Actos she was tried on glipizide ER with not clear if she took it She apparently did not seem to have good control of her blood sugars with a trial of Tradjenta  Recent history:   Oral hypoglycemic drugs: Actos 30 mg, metformin 500 mg twice a day  She has not been seen in follow-up since 12/15  She is again on Actos even though it was recommended by her PCP to stop this because of edema in May Previously with stopping her Actos her edema was still persistent to some degree  Did not bring her monitor for review but she thinks blood sugars are fairly good and her A1c was reasonable at 6.5 a couple months ago Not able to do much exercise and her weight is stable       Monitors blood glucose: ?  Once a day .    Glucometer: One Touch.          Blood Glucose readings from recall: 140-160 in the mornings  Hypoglycemia:  none        Physical activity: exercise: Only walking around the house       Diet: She stays away from fast food      Dietician visit: Most recent:?           Wt Readings from Last 3 Encounters:  09/19/14 188 lb 9.6 oz (85.548 kg)  08/02/14 187 lb 4.8 oz (84.959 kg)  07/23/14 185 lb (83.915 kg)    LABS:  Lab Results  Component Value Date   HGBA1C 6.5* 07/27/2014   HGBA1C 5.8 02/19/2014   HGBA1C 7.1* 12/08/2013   Lab Results  Component Value Date   MICROALBUR 10.1* 08/09/2013   LDLCALC 91 07/27/2014   CREATININE 1.60* 09/13/2014    OTHER problems  including edema are discussed in review of systems      Medication List       This list is accurate as of: 09/19/14  3:41 PM.  Always use your most recent med list.               amiodarone 200 MG tablet  Commonly known as:  PACERONE  TAKE 2 TABS TWICE DAILY FOR 1 WEEK, THEN 1 TAB TWICE A DAY FOR 1 WEEK, THEN 1 TAB DAILY     amLODipine 5 MG tablet  Commonly known as:  NORVASC  TAKE 1 TABLET (5 MG TOTAL) BY MOUTH DAILY.     apixaban 5 MG Tabs tablet  Commonly known as:  ELIQUIS  Take 1 tablet (5 mg total) by mouth 2 (two) times daily.     atorvastatin 10 MG tablet  Commonly known as:  LIPITOR  Take 10 mg by mouth daily.     DSS 100 MG Caps  Take 100 mg by mouth 2 (two) times daily.     ferrous sulfate 325 (65 FE) MG EC tablet  Take 325 mg by mouth daily with breakfast.  furosemide 40 MG tablet  Commonly known as:  LASIX  Take 1 tablet (40 mg total) by mouth daily.     glipiZIDE 2.5 MG 24 hr tablet  Commonly known as:  GLIPIZIDE XL  Take 1 tablet (2.5 mg total) by mouth daily with breakfast.     HYDROcodone-acetaminophen 5-325 MG per tablet  Commonly known as:  NORCO  Take 1-2 tablets by mouth every 6 (six) hours as needed for moderate pain.     lisinopril-hydrochlorothiazide 20-12.5 MG per tablet  Commonly known as:  PRINZIDE,ZESTORETIC  Take 1 tablet by mouth daily.     metFORMIN 500 MG 24 hr tablet  Commonly known as:  GLUCOPHAGE-XR  TAKE 1 TABLET EVERY MORNING AND 2 TABLETS IN THE EVENING     metoprolol 100 MG tablet  Commonly known as:  LOPRESSOR  Take 1 tablet (100 mg total) by mouth 2 (two) times daily.     pioglitazone 30 MG tablet  Commonly known as:  ACTOS  TAKE 1 TABLET BY MOUTH EVERY DAY     spironolactone 25 MG tablet  Commonly known as:  ALDACTONE  Take 1 tablet (25 mg total) by mouth daily.     SYNTHROID 50 MCG tablet  Generic drug:  levothyroxine  TAKE 1 TABLET EVERY DAY BEFORE BREAKFAST     SYSTANE OP  Place 1 drop into both  eyes 3 (three) times daily.     tamoxifen 20 MG tablet  Commonly known as:  NOLVADEX  Take 1 tablet (20 mg total) by mouth daily.     vitamin B-12 1000 MCG tablet  Commonly known as:  CYANOCOBALAMIN  Take 1,000 mcg by mouth daily.     Vitamin D 2000 UNITS Caps  Take 2,000 Units by mouth daily.        Allergies: No Known Allergies  Past Medical History  Diagnosis Date  . Hypertension   . Hyperlipidemia   . Diabetes mellitus without complication   . Thyroid disease     hypothyroidism  . Anemia   . Former smoker   . Multinodular goiter   . Complication of anesthesia     slow to wake up  . Cancer     right breast  . Wears glasses   . Full dentures   . Dysrhythmia 10/15    AF  . Breast cancer 10/2013    right upper outer  . Atrial fibrillation   . Radiation     Right Breast    Past Surgical History  Procedure Laterality Date  . Abdominal hysterectomy    . Portacath placement N/A 11/15/2013    Procedure: INSERTION PORT-A-CATH WITH ULTRA SOUND AND FLOROSCOPY;  Surgeon: [name], MD;  Location: [facility];  Service: General;  Laterality: N/A;  . Eye surgery  12/22/2009    cataracts  . Total knee arthroplasty  2002    left  . Port-a-cath removal Right 01/09/2014    Procedure: REMOVAL PORT-A-CATH;  Surgeon: [name], MD;  Location: [facility];  Service: General;  Laterality: Right;  . Breast lumpectomy with radioactive seed localization Right 01/09/2014    Procedure: RIGHT BREAST SEED LOCALIZED LUMPECTOMY ;  Surgeon: [name], MD;  Location: [facility];  Service: General;  Laterality: Right;  . Axillary lymph node dissection Right 01/09/2014    Procedure: RIGHT AXILLARY LYMPH NODE DISECTION;  Surgeon: [name], MD;  Location: [facility];  Service: General;  Laterality: Right;  . Intramedullary (im) nail intertrochanteric Left  02/24/2014    Procedure: IM ROD LEFT HIP FX;  Surgeon: Alta Corning, MD;   Location: [facility];  Service: Orthopedics;  Laterality: Left;    No family history on file.  Social History:  reports that she quit smoking about 55 years ago. She has never used smokeless tobacco. She reports that she does not drink alcohol or use illicit drugs.  Review of Systems:  Hypertension: She usually has had good control with her lisinopril HCTZ 20/12.5 mg and is also taking amlodipine 5 mg daily Medications were apparently not changed by PCP on her physical couple of months ago   EDEMA She is taking Lasix for edema and sometimes may take 2 tablets daily She still has significant swelling of her legs.  She does not wear an elastic stockings  ADRENAL mass:  She was evaluated by a PET scan after  diagnosis of breast cancer She was found to have a 3.7 cm hypermetabolic left adrenal mass  On a previous CT scan in 2011 she also had a 3.8 cm adrenal mass Cortisol level is normal  Again has hypokalemia recently and is not taking any supplements of potassium despite taking 40-80 mg of Lasix along with HCTZ  Renal function: Creatinine is significantly worse and not clear why.  Does not think she takes any OTC ibuprofen  Lab Results  Component Value Date   CREATININE 1.60* 09/13/2014    Lipids: Has been on long-term Lipitor with good control    Lab Results  Component Value Date   CHOL 179 07/27/2014   HDL 74 07/27/2014   LDLCALC 91 07/27/2014   TRIG 68 07/27/2014   CHOLHDL 2.4 07/27/2014    She has had vitamin D deficiency, her level was 35 previously with 2000 units vitamin D 3  HYPOTHYROIDISM: She has had a multinodular goiter and not clear if she is truly hypothyroid. Over the last couple of years her dose has been tapered down because of low normal TSH levels and has not had any increase in the size of her goiter.   She is compliant with her thyroid medication in the morning before breakfast. No fatigue  TSH have been low normal and is now taking 50 g, was supposed to  be taking only 25 g previously Her TSH is again low and she did not reduce her dose to 6 days a week as recommended by her PCP and may No symptoms of palpitations or shakiness  Lab Results  Component Value Date   FREET4 1.42 11/08/2013   FREET4 1.37 11/24/2012   TSH 0.249* 07/27/2014   TSH 0.549 01/30/2014   TSH 0.551 12/08/2013    Examination:   BP 140/68 mmHg  Pulse 60  Temp(Src) 98 F (36.7 C)  Resp 16  Ht 5\' 7"  (1.702 m)  Wt 188 lb 9.6 oz (85.548 kg)  BMI 29.53 kg/m2  SpO2 98%  Body mass index is 29.53 kg/(m^2).   THYROID: She has a left-sided 2.5 cm nodule and slightly nodular right lobe palpable  She has 2-3 + right lower leg and 2 + left lower leg edema Diabetic foot exam shows normal monofilament sensation in the toes and plantar surfaces, no skin lesions or ulcers on the feet and normal pedal pulses Biceps reflexes are normal.  ASSESSMENT/ PLAN:  Diabetes type 2   Blood glucose control is fairly good with metformin 1000 mg and Actos 30 mg See history of present illness for detailed discussion of his current management, blood sugar patterns  and problems identified She was supposed to be taking only glipizide and metformin but not clear why she has gone back on Actos and also did not stop it despite instructions former PCP A1c is 6.5 which is reasonably good She has not brought her monitor for download.    Because of her edema will need to stop her Actos again Also with her creatinine 1.6 will need to temporarily stop her morning metformin Discussed that we can try her on glipizide ER 2.5 mg daily with above changes However will need to have follow-up in 3-4 weeks  She does need to bring her monitor for review and also try to check more readings after meals  Advised her to be consistent with diet  RENAL insufficiency:  Etiology is not clear and may be related to prerenal azotemia because of her using lisinopril and diuretics Also will need to evaluate her  urinalysis and microalbumin today which is overdue  Hypertension: Blood pressure is controlled currently   ADRENAL MASS This has been stable with last CT scan in 2015 and she does not appear to have any clinical features of pheochromocytoma.  Cortisol is normal Does have hypokalemia but this is explained by diuretics Continue to follow clinically  EDEMA: Slightly worse recently even with diuretics as above She may respond to adding Aldactone at least 25 mg daily which would also relieve her hypokalemia  Hypokalemia: Secondary to  diuretics including Lasix, will have her take potassium supplement for 3 days only since she is starting ALDACTONE 25 mg At the same time will reduce her LISINOPRIL HCT to half a tablet because of renal insufficiency She does need to follow-up with PCP next week  Multinodular goiter: On low doses of thyroxine suppression with low TSH 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

## 2014-09-19 NOTE — Patient Instructions (Addendum)
Check blood sugars on waking up .. 2 .. times a week Also check blood sugars about 2 hours after a meal and do this after different meals by rotation  Recommended blood sugar levels on waking up is 90-130 and about 2 hours after meal is 140-180 Please bring blood sugar monitor to each visit.  Glipizide ER 1 daily Reduce Metformin to 1 daily at supper only  Stop Actos and SYNTHROID  Lisinopril reduce in 1/2  Start Spironolactone 25mg  daily, continue Lasix  Take potassium for 3 days only

## 2014-09-21 ENCOUNTER — Other Ambulatory Visit: Payer: Self-pay | Admitting: Family Medicine

## 2014-09-21 MED ORDER — POTASSIUM CHLORIDE CRYS ER 20 MEQ PO TBCR: 20.0000 meq | EXTENDED_RELEASE_TABLET | Freq: Once | ORAL | Status: DC

## 2014-09-28 ENCOUNTER — Ambulatory Visit (INDEPENDENT_AMBULATORY_CARE_PROVIDER_SITE_OTHER): Payer: Medicare Other | Admitting: Family Medicine

## 2014-09-28 ENCOUNTER — Encounter: Payer: Self-pay | Admitting: Family Medicine

## 2014-09-28 VITALS — BP 136/70 | HR 72 | Temp 98.7°F | Resp 18 | Wt 195.0 lb

## 2014-09-28 DIAGNOSIS — R600 Localized edema: Secondary | ICD-10-CM

## 2014-09-28 NOTE — Progress Notes (Signed)
Subjective:    Patient ID: Whitney [name], female    DOB: [birth year], 81 y.o.   MRN: [id number]  HPI Patient was recently seen by her endocrinologist. According to the patient he started her on spironolactone and discontinued her furosemide. She is also on Benicar HCT. Patient is unsure why the doctor did this. Her labwork recently revealed hypokalemia with a rise in her creatinine from 1.2-1.6. Patient now has bilateral tense pitting edema that is 2+ from her ankles to her knees. Patient had an echocardiogram of her heart in September of last year which revealed an ejection fraction of 60%.  She also has diabetes. At present her hemoglobin A1c is 6.5 when checked in May. She is currently on Actos as well as glipizide.  She is also taking amlodipine.  Past Medical History  Diagnosis Date  . Hypertension   . Hyperlipidemia   . Diabetes mellitus without complication   . Thyroid disease     hypothyroidism  . Anemia   . Former smoker   . Multinodular goiter   . Complication of anesthesia     slow to wake up  . Cancer     right breast  . Wears glasses   . Full dentures   . Dysrhythmia 10/15    AF  . Breast cancer 10/2013    right upper outer  . Atrial fibrillation   . Radiation     Right Breast   Past Surgical History  Procedure Laterality Date  . Abdominal hysterectomy    . Portacath placement N/A 11/15/2013    Procedure: INSERTION PORT-A-CATH WITH ULTRA SOUND AND FLOROSCOPY;  Surgeon: [name], MD;  Location: [place];  Service: General;  Laterality: N/A;  . Eye surgery  12/22/2009    cataracts  . Total knee arthroplasty  2002    left  . Port-a-cath removal Right 01/09/2014    Procedure: REMOVAL PORT-A-CATH;  Surgeon: [name], MD;  Location: [place];  Service: General;  Laterality: Right;  . Breast lumpectomy with radioactive seed localization Right 01/09/2014    Procedure: RIGHT BREAST SEED LOCALIZED LUMPECTOMY ;  Surgeon: [name], MD;   Location: [place];  Service: General;  Laterality: Right;  . Axillary lymph node dissection Right 01/09/2014    Procedure: RIGHT AXILLARY LYMPH NODE DISECTION;  Surgeon: [name], MD;  Location: [place];  Service: General;  Laterality: Right;  . Intramedullary (im) nail intertrochanteric Left 02/24/2014    Procedure: IM ROD LEFT HIP FX;  Surgeon: Alta Corning, MD;  Location: [place];  Service: Orthopedics;  Laterality: Left;   Current Outpatient Prescriptions on File Prior to Visit  Medication Sig Dispense Refill  . amiodarone (PACERONE) 200 MG tablet TAKE 2 TABS TWICE DAILY FOR 1 WEEK, THEN 1 TAB TWICE A DAY FOR 1 WEEK, THEN 1 TAB DAILY 30 tablet 1  . amLODipine (NORVASC) 5 MG tablet TAKE 1 TABLET (5 MG TOTAL) BY MOUTH DAILY. 30 tablet 0  . apixaban (ELIQUIS) 5 MG TABS tablet Take 1 tablet (5 mg total) by mouth 2 (two) times daily. 60 tablet 11  . atorvastatin (LIPITOR) 10 MG tablet Take 10 mg by mouth daily.    . Cholecalciferol (VITAMIN D) 2000 UNITS CAPS Take 2,000 Units by mouth daily.    Marland Kitchen docusate sodium 100 MG CAPS Take 100 mg by mouth 2 (two) times daily. 10 capsule 0  . ferrous sulfate 325 (65 FE) MG EC tablet Take 325 mg  by mouth daily with breakfast.    . glipiZIDE (GLIPIZIDE XL) 2.5 MG 24 hr tablet Take 1 tablet (2.5 mg total) by mouth daily with breakfast. 30 tablet 3  . HYDROcodone-acetaminophen (NORCO) 5-325 MG per tablet Take 1-2 tablets by mouth every 6 (six) hours as needed for moderate pain. 60 tablet 0  . lisinopril-hydrochlorothiazide (PRINZIDE,ZESTORETIC) 20-12.5 MG per tablet Take 1 tablet by mouth daily.  5  . metFORMIN (GLUCOPHAGE-XR) 500 MG 24 hr tablet TAKE 1 TABLET EVERY MORNING AND 2 TABLETS IN THE EVENING (Patient taking differently: 1 TABLETS IN THE EVENING) 270 tablet 1  . metoprolol (LOPRESSOR) 100 MG tablet Take 1 tablet (100 mg total) by mouth 2 (two) times daily. 60 tablet 0  . Polyethyl Glycol-Propyl Glycol (SYSTANE  OP) Place 1 drop into both eyes 3 (three) times daily.    . potassium chloride SA (K-DUR,KLOR-CON) 20 MEQ tablet Take 1 tablet (20 mEq total) by mouth once. X 5 days 5 tablet 0  . spironolactone (ALDACTONE) 25 MG tablet Take 1 tablet (25 mg total) by mouth daily. 90 tablet 3  . tamoxifen (NOLVADEX) 20 MG tablet Take 1 tablet (20 mg total) by mouth daily. 90 tablet 3  . vitamin B-12 (CYANOCOBALAMIN) 1000 MCG tablet Take 1,000 mcg by mouth daily.    . furosemide (LASIX) 40 MG tablet Take 1 tablet (40 mg total) by mouth daily. (Patient not taking: Reported on 09/28/2014) 30 tablet   . SYNTHROID 50 MCG tablet TAKE 1 TABLET EVERY DAY BEFORE BREAKFAST (Patient not taking: Reported on 09/28/2014) 90 tablet 1   No current facility-administered medications on file prior to visit.   No Known Allergies History   Social History  . Marital Status: Widowed    Spouse Name: N/A  . Number of Children: 4  . Years of Education: N/A   Occupational History  . Not on file.   Social History Main Topics  . Smoking status: Former Smoker -- 1.00 packs/day for 5 years    Quit date: 11/14/1958  . Smokeless tobacco: Never Used  . Alcohol Use: No  . Drug Use: No  . Sexual Activity: No     Comment: menarche age 36, P69, first birth age 81, no HRT, menopause age 72   Other Topics Concern  . Not on file   Social History Narrative      Review of Systems  All other systems reviewed and are negative.      Objective:   Physical Exam  Neck: No JVD present.  Cardiovascular: Normal rate and normal heart sounds.   Pulmonary/Chest: Effort normal and breath sounds normal. No respiratory distress. She has no wheezes. She has no rales.  Abdominal: Soft. Bowel sounds are normal.  Musculoskeletal: She exhibits edema.  Vitals reviewed.         Assessment & Plan:  Bilateral leg edema  I believe this is likely multifactorial and related to patient taking Actos as well as amlodipine and the recent  discontinuation of Lasix. I did not like the patient taking Lasix and hydrochlorothiazide together. I was unaware that she was doing this. This may have caused the rise in her creatinine. I will see the patient back on Thursday. I will have her temporarily discontinue Actos to see if this will improve her leg swelling. I will replace it with Januvia 100 mg a day. If on Thursday her leg swelling is better, I will discontinue hydrochlorothiazide I will continue the Benicar I will discontinue spironolactone and I will  start the patient back on her Lasix and then recheck her renal function in 2 weeks

## 2014-10-04 ENCOUNTER — Encounter: Payer: Self-pay | Admitting: Family Medicine

## 2014-10-04 ENCOUNTER — Ambulatory Visit (INDEPENDENT_AMBULATORY_CARE_PROVIDER_SITE_OTHER): Payer: Medicare Other | Admitting: Family Medicine

## 2014-10-04 VITALS — BP 144/64 | HR 60 | Temp 98.7°F | Resp 16 | Wt 193.0 lb

## 2014-10-04 DIAGNOSIS — R600 Localized edema: Secondary | ICD-10-CM | POA: Diagnosis not present

## 2014-10-04 NOTE — Progress Notes (Signed)
Subjective:    Patient ID: Whitney [name], female    DOB: [birth year], 81 y.o.   MRN: [id number]  HPI 09/28/14 Patient was recently seen by her endocrinologist. According to the patient he started her on spironolactone and discontinued her furosemide. She is also on Benicar HCT. Patient is unsure why the doctor did this. Her labwork recently revealed hypokalemia with a rise in her creatinine from 1.2-1.6. Patient now has bilateral tense pitting edema that is 2+ from her ankles to her knees. Patient had an echocardiogram of her heart in September of last year which revealed an ejection fraction of 60%.  She also has diabetes. At present her hemoglobin A1c is 6.5 when checked in May. She is currently on Actos as well as glipizide.  She is also taking amlodipine.  At that time, my plan was:  I believe this is likely multifactorial and related to patient taking Actos as well as amlodipine and the recent discontinuation of Lasix. I did not like the patient taking Lasix and hydrochlorothiazide together. I was unaware that she was doing this. This may have caused the rise in her creatinine. I will see the patient back on Thursday. I will have her temporarily discontinue Actos to see if this will improve her leg swelling. I will replace it with Januvia 100 mg a day. If on Thursday her leg swelling is better, I will discontinue hydrochlorothiazide I will continue the Benicar I will discontinue spironolactone and I will start the patient back on her Lasix and then recheck her renal function in 2 weeks  10/04/14 Since I last saw the patient, she has lost 2 pounds. The swelling is only slightly better in her legs. She still has +2 edema in her legs to the knee. Her legs are sore and tender from the swelling   Past Medical History  Diagnosis Date  . Hypertension   . Hyperlipidemia   . Diabetes mellitus without complication   . Thyroid disease     hypothyroidism  . Anemia   . Former smoker   .  Multinodular goiter   . Complication of anesthesia     slow to wake up  . Cancer     right breast  . Wears glasses   . Full dentures   . Dysrhythmia 10/15    AF  . Breast cancer 10/2013    right upper outer  . Atrial fibrillation   . Radiation     Right Breast   Past Surgical History  Procedure Laterality Date  . Abdominal hysterectomy    . Portacath placement N/A 11/15/2013    Procedure: INSERTION PORT-A-CATH WITH ULTRA SOUND AND FLOROSCOPY;  Surgeon: [name], MD;  Location: [place];  Service: General;  Laterality: N/A;  . Eye surgery  12/22/2009    cataracts  . Total knee arthroplasty  2002    left  . Port-a-cath removal Right 01/09/2014    Procedure: REMOVAL PORT-A-CATH;  Surgeon: [name], MD;  Location: [place];  Service: General;  Laterality: Right;  . Breast lumpectomy with radioactive seed localization Right 01/09/2014    Procedure: RIGHT BREAST SEED LOCALIZED LUMPECTOMY ;  Surgeon: [name], MD;  Location: [place];  Service: General;  Laterality: Right;  . Axillary lymph node dissection Right 01/09/2014    Procedure: RIGHT AXILLARY LYMPH NODE DISECTION;  Surgeon: [name], MD;  Location: [place];  Service: General;  Laterality: Right;  . Intramedullary (im) nail intertrochanteric Left 02/24/2014  Procedure: IM ROD LEFT HIP FX;  Surgeon: Alta Corning, MD;  Location: [place];  Service: Orthopedics;  Laterality: Left;   Current Outpatient Prescriptions on File Prior to Visit  Medication Sig Dispense Refill  . amiodarone (PACERONE) 200 MG tablet TAKE 2 TABS TWICE DAILY FOR 1 WEEK, THEN 1 TAB TWICE A DAY FOR 1 WEEK, THEN 1 TAB DAILY 30 tablet 1  . amLODipine (NORVASC) 5 MG tablet TAKE 1 TABLET (5 MG TOTAL) BY MOUTH DAILY. 30 tablet 0  . apixaban (ELIQUIS) 5 MG TABS tablet Take 1 tablet (5 mg total) by mouth 2 (two) times daily. 60 tablet 11  . atorvastatin (LIPITOR) 10 MG tablet Take 10 mg by mouth  daily.    . Cholecalciferol (VITAMIN D) 2000 UNITS CAPS Take 2,000 Units by mouth daily.    Marland Kitchen docusate sodium 100 MG CAPS Take 100 mg by mouth 2 (two) times daily. 10 capsule 0  . ferrous sulfate 325 (65 FE) MG EC tablet Take 325 mg by mouth daily with breakfast.    . furosemide (LASIX) 40 MG tablet Take 1 tablet (40 mg total) by mouth daily. (Patient not taking: Reported on 09/28/2014) 30 tablet   . glipiZIDE (GLIPIZIDE XL) 2.5 MG 24 hr tablet Take 1 tablet (2.5 mg total) by mouth daily with breakfast. 30 tablet 3  . HYDROcodone-acetaminophen (NORCO) 5-325 MG per tablet Take 1-2 tablets by mouth every 6 (six) hours as needed for moderate pain. 60 tablet 0  . lisinopril-hydrochlorothiazide (PRINZIDE,ZESTORETIC) 20-12.5 MG per tablet Take 1 tablet by mouth daily.  5  . metFORMIN (GLUCOPHAGE-XR) 500 MG 24 hr tablet TAKE 1 TABLET EVERY MORNING AND 2 TABLETS IN THE EVENING (Patient taking differently: 1 TABLETS IN THE EVENING) 270 tablet 1  . metoprolol (LOPRESSOR) 100 MG tablet Take 1 tablet (100 mg total) by mouth 2 (two) times daily. 60 tablet 0  . pioglitazone (ACTOS) 30 MG tablet Take 30 mg by mouth daily.  1  . Polyethyl Glycol-Propyl Glycol (SYSTANE OP) Place 1 drop into both eyes 3 (three) times daily.    . potassium chloride SA (K-DUR,KLOR-CON) 20 MEQ tablet Take 1 tablet (20 mEq total) by mouth once. X 5 days 5 tablet 0  . spironolactone (ALDACTONE) 25 MG tablet Take 1 tablet (25 mg total) by mouth daily. 90 tablet 3  . SYNTHROID 50 MCG tablet TAKE 1 TABLET EVERY DAY BEFORE BREAKFAST (Patient not taking: Reported on 09/28/2014) 90 tablet 1  . tamoxifen (NOLVADEX) 20 MG tablet Take 1 tablet (20 mg total) by mouth daily. 90 tablet 3  . vitamin B-12 (CYANOCOBALAMIN) 1000 MCG tablet Take 1,000 mcg by mouth daily.     No current facility-administered medications on file prior to visit.   No Known Allergies History   Social History  . Marital Status: Widowed    Spouse Name: N/A  . Number of  Children: 4  . Years of Education: N/A   Occupational History  . Not on file.   Social History Main Topics  . Smoking status: Former Smoker -- 1.00 packs/day for 5 years    Quit date: 11/14/1958  . Smokeless tobacco: Never Used  . Alcohol Use: No  . Drug Use: No  . Sexual Activity: No     Comment: menarche age 77, P61, first birth age 14, no HRT, menopause age 89   Other Topics Concern  . Not on file   Social History Narrative      Review of Systems  All  other systems reviewed and are negative.      Objective:   Physical Exam  Neck: No JVD present.  Cardiovascular: Normal rate and normal heart sounds.   Pulmonary/Chest: Effort normal and breath sounds normal. No respiratory distress. She has no wheezes. She has no rales.  Abdominal: Soft. Bowel sounds are normal.  Musculoskeletal: She exhibits edema.  Vitals reviewed.         Assessment & Plan:  Bilateral leg edema  Discontinue lisinopril/hydrochlorothiazide. Replace with lisinopril 20 mg a day. Resume Lasix 40 mg by mouth daily. Begin to wear knee-high compression stockings 15-20 mm, continue to refrain from using Actos and continue to continue his Januvia 100 mg by mouth daily. Recheck kidney function and potassium in 2 weeks

## 2014-10-11 ENCOUNTER — Other Ambulatory Visit: Payer: Self-pay | Admitting: Endocrinology

## 2014-10-17 ENCOUNTER — Ambulatory Visit (INDEPENDENT_AMBULATORY_CARE_PROVIDER_SITE_OTHER): Payer: Medicare Other | Admitting: Endocrinology

## 2014-10-17 ENCOUNTER — Other Ambulatory Visit: Payer: Self-pay | Admitting: *Deleted

## 2014-10-17 ENCOUNTER — Encounter: Payer: Self-pay | Admitting: Endocrinology

## 2014-10-17 VITALS — BP 128/60 | HR 56 | Temp 97.9°F | Resp 16 | Ht 67.0 in | Wt 177.8 lb

## 2014-10-17 DIAGNOSIS — I10 Essential (primary) hypertension: Secondary | ICD-10-CM | POA: Diagnosis not present

## 2014-10-17 DIAGNOSIS — E119 Type 2 diabetes mellitus without complications: Secondary | ICD-10-CM

## 2014-10-17 DIAGNOSIS — N289 Disorder of kidney and ureter, unspecified: Secondary | ICD-10-CM | POA: Diagnosis not present

## 2014-10-17 LAB — COMPREHENSIVE METABOLIC PANEL
ALT: 28 U/L (ref 0–35)
AST: 27 U/L (ref 0–37)
Albumin: 3.7 g/dL (ref 3.5–5.2)
Alkaline Phosphatase: 51 U/L (ref 39–117)
BUN: 28 mg/dL — ABNORMAL HIGH (ref 6–23)
CO2: 30 mEq/L (ref 19–32)
Calcium: 9.3 mg/dL (ref 8.4–10.5)
Chloride: 106 mEq/L (ref 96–112)
Creatinine, Ser: 1.48 mg/dL — ABNORMAL HIGH (ref 0.40–1.20)
GFR: 43.14 mL/min — ABNORMAL LOW (ref >60.00)
Glucose, Bld: 128 mg/dL — ABNORMAL HIGH (ref 70–99)
Potassium: 3.8 mEq/L (ref 3.5–5.1)
Sodium: 141 mEq/L (ref 135–145)
Total Bilirubin: 0.4 mg/dL (ref 0.2–1.2)
Total Protein: 6.4 g/dL (ref 6.0–8.3)

## 2014-10-17 MED ORDER — ONETOUCH DELICA LANCETS 33G MISC: Status: DC

## 2014-10-17 MED ORDER — GLUCOSE BLOOD VI STRP: ORAL_STRIP | Status: DC

## 2014-10-17 NOTE — Patient Instructions (Addendum)
Hold Januvia and stay on Glipizide only  Check blood sugars on waking up ..  2-3.. times a week Also check blood sugars about 2 hours after a meal and do this after different meals by rotation  Recommended blood sugar levels on waking up is 90-130 and about 2 hours after meal is 140-180 Please bring blood sugar monitor to each visit.

## 2014-10-17 NOTE — Progress Notes (Signed)
Patient ID: Whitney [name], female   DOB: 1927-04-05, 81 y.o.   MRN: [id number]   Reason for Appointment: Diabetes and other issues  History of Present Illness    Type 2 DIABETES MELITUS, date of diagnosis:  1983     Previous history: She had been taking metformin and Actos for several years.  She usually has had excellent control with upper normal A1c Her metformin was reduced to 1000 mg daily because of her age and borderline renal function Also because of higher blood sugars with stopping her Actos she was tried on glipizide ER with not clear if she took it She apparently did not seem to have good control of her blood sugars with a trial of Tradjenta  Recent history:   Oral hypoglycemic drugs: metformin 500 mg once a day, Januvia, glipizide ER 2.5 mg daily  She will seen in follow-up in 09/2014 after several months hiatus and because of edema she was told to stop Actos She was started on glipizide ER 2.5 mg daily instead However she was also given Januvia by her PCP who who was not aware that she was not taking Actos. She has not checked blood sugars recently but after starting glipizide her sugars have come down to near normal levels without hypoglycemia She has not checked any blood sugars in the last 2 weeks as she had some difficulty with her monitor Lab glucose today in the office is about 120 postprandially  Her weight has decreased significantly, possibly from improve edema       Monitors blood glucose: ?  Once a day .    Glucometer: One Touch.          Blood Glucose readings from download show recent readings 105-130 3 in the morning and 141  in the evening, previously as high as 163, generally not taking blood sugars in the afternoons and evenings Overall median glucose 134 for the last 4 weeks  Hypoglycemia:  none        Physical activity: exercise: Only walking around the house       Diet: She stays away from fast food      Dietician visit: Most  recent:?           Wt Readings from Last 3 Encounters:  10/17/14 177 lb 12.8 oz (80.65 kg)  10/04/14 193 lb (87.544 kg)  09/28/14 195 lb (88.451 kg)    LABS:  Lab Results  Component Value Date   HGBA1C 6.5* 07/27/2014   HGBA1C 5.8 02/19/2014   HGBA1C 7.1* 12/08/2013   Lab Results  Component Value Date   MICROALBUR 2.7* 09/19/2014   [place] 91 07/27/2014   CREATININE 1.48* 10/17/2014    OTHER problems including renal dysfunction are discussed in review of systems      Medication List       This list is accurate as of: 10/17/14  9:07 PM.  Always use your most recent med list.               amiodarone 200 MG tablet  Commonly known as:  PACERONE  TAKE 2 TABS TWICE DAILY FOR 1 WEEK, THEN 1 TAB TWICE A DAY FOR 1 WEEK, THEN 1 TAB DAILY     amLODipine 5 MG tablet  Commonly known as:  NORVASC  TAKE 1 TABLET (5 MG TOTAL) BY MOUTH DAILY.     apixaban 5 MG Tabs tablet  Commonly known as:  ELIQUIS  Take 1 tablet (5 mg total) by  mouth 2 (two) times daily.     atorvastatin 10 MG tablet  Commonly known as:  LIPITOR  Take 10 mg by mouth daily.     DSS 100 MG Caps  Take 100 mg by mouth 2 (two) times daily.     ferrous sulfate 325 (65 FE) MG EC tablet  Take 325 mg by mouth daily with breakfast.     furosemide 40 MG tablet  Commonly known as:  LASIX  Take 1 tablet (40 mg total) by mouth daily.     glipiZIDE 2.5 MG 24 hr tablet  Commonly known as:  GLIPIZIDE XL  Take 1 tablet (2.5 mg total) by mouth daily with breakfast.     glucose blood test strip  Commonly known as:  ONETOUCH VERIO  Use as instructed to check blood sugar once a day dx code E11.9     lisinopril 20 MG tablet  Commonly known as:  PRINIVIL,ZESTRIL  Take 20 mg by mouth daily.     metFORMIN 500 MG 24 hr tablet  Commonly known as:  GLUCOPHAGE-XR  TAKE 1 TABLET EVERY MORNING AND 2 TABLETS IN THE EVENING     ONETOUCH DELICA LANCETS 98P Misc  Use to check blood sugar once a day dx code E11.9      potassium chloride SA 20 MEQ tablet  Commonly known as:  K-DUR,KLOR-CON  Take 1 tablet (20 mEq total) by mouth once. X 5 days     spironolactone 25 MG tablet  Commonly known as:  ALDACTONE  Take 1 tablet (25 mg total) by mouth daily.     SYSTANE OP  Place 1 drop into both eyes 3 (three) times daily.     tamoxifen 20 MG tablet  Commonly known as:  NOLVADEX  Take 1 tablet (20 mg total) by mouth daily.     vitamin B-12 1000 MCG tablet  Commonly known as:  CYANOCOBALAMIN  Take 1,000 mcg by mouth daily.     Vitamin D 2000 UNITS Caps  Take 2,000 Units by mouth daily.        Allergies: No Known Allergies  Past Medical History  Diagnosis Date  . Hypertension   . Hyperlipidemia   . Diabetes mellitus without complication   . Thyroid disease     hypothyroidism  . Anemia   . Former smoker   . Multinodular goiter   . Complication of anesthesia     slow to wake up  . Cancer     right breast  . Wears glasses   . Full dentures   . Dysrhythmia 10/15    AF  . Breast cancer 10/2013    right upper outer  . Atrial fibrillation   . Radiation     Right Breast    Past Surgical History  Procedure Laterality Date  . Abdominal hysterectomy    . Portacath placement N/A 11/15/2013    Procedure: INSERTION PORT-A-CATH WITH ULTRA SOUND AND FLOROSCOPY;  Surgeon: [name], MD;  Location: [place];  Service: General;  Laterality: N/A;  . Eye surgery  12/22/2009    cataracts  . Total knee arthroplasty  2002    left  . Port-a-cath removal Right 01/09/2014    Procedure: REMOVAL PORT-A-CATH;  Surgeon: [name], MD;  Location: [place];  Service: General;  Laterality: Right;  . Breast lumpectomy with radioactive seed localization Right 01/09/2014    Procedure: RIGHT BREAST SEED LOCALIZED LUMPECTOMY ;  Surgeon: [name], MD;  Location: [place];  Service: General;  Laterality: Right;  . Axillary lymph node dissection Right 01/09/2014     Procedure: RIGHT AXILLARY LYMPH NODE DISECTION;  Surgeon: [name], MD;  Location: [place];  Service: General;  Laterality: Right;  . Intramedullary (im) nail intertrochanteric Left 02/24/2014    Procedure: IM ROD LEFT HIP FX;  Surgeon: Alta Corning, MD;  Location: [place];  Service: Orthopedics;  Laterality: Left;    No family history on file.  Social History:  reports that she quit smoking about 55 years ago. She has never used smokeless tobacco. She reports that she does not drink alcohol or use illicit drugs.  Review of Systems:  Hypertension: She usually has had good control, now taking lisinopril 20 mg along with Aldactone 25 mg and is also taking amlodipine 5 mg daily Medications were apparently  changed by PCP recently  EDEMA She is taking Lasix for edema and edema is better with adding Aldactone Potassium is back to normal with starting Aldactone and she is off potassium supplements  ADRENAL mass:  She was evaluated by a PET scan after  diagnosis of breast cancer She was found to have a 3.7 cm hypermetabolic left adrenal mass  On a previous CT scan in 2011 she also had a 3.8 cm adrenal mass Cortisol level is normal   Renal function: Creatinine is still slightly high  Lab Results  Component Value Date   CREATININE 1.48* 10/17/2014   BUN 28* 10/17/2014   NA 141 10/17/2014   K 3.8 10/17/2014   CL 106 10/17/2014   CO2 30 10/17/2014    Lipids: Has been on long-term Lipitor with good control    Lab Results  Component Value Date   CHOL 179 07/27/2014   HDL 74 07/27/2014   LDLCALC 91 07/27/2014   TRIG 68 07/27/2014   CHOLHDL 2.4 07/27/2014    She has had vitamin D deficiency, her level was 35 previously with 2000 units vitamin D 3  HYPOTHYROIDISM: She has had a multinodular goiter and not clear if she is truly hypothyroid. Over the last couple of years her dose has been tapered down because of low normal TSH levels and has not had any increase  in the size of her goiter.   She is compliant with her thyroid medication in the morning before breakfast. No fatigue  TSH have been low normal and she was told to stop taking levothyroxine as she does not have a diagnosis of hypothyroidism   Lab Results  Component Value Date   FREET4 1.42 11/08/2013   FREET4 1.37 11/24/2012   TSH 0.249* 07/27/2014   TSH 0.549 01/30/2014   TSH 0.551 12/08/2013   Last foot exam in 7/16  Examination:   BP 128/60 mmHg  Pulse 56  Temp(Src) 97.9 F (36.6 C)  Resp 16  Ht 5\' 7"  (1.702 m)  Wt 177 lb 12.8 oz (80.65 kg)  BMI 27.84 kg/m2  SpO2 96%  Body mass index is 27.84 kg/(m^2).    She has trace ankle edema  ASSESSMENT/ PLAN:  Diabetes type 2   Blood glucose control is fairly good with glipizide ER 2.5 mg She has been only on 500 mg metformin since her renal function had been worse and most likely does not need this as it has only minimal benefit Also since the sugars came down significantly with glipizide will need to stop her Januvia to avoid potential hypoglycemia at her age She was given a new glucose monitor today  RENAL insufficiency:  Creatinine is better compared to about a month ago  Etiology is not clear and may be related to prerenal azotemia because of her using lisinopril and diuretics Also will need to evaluate her urinalysis and microalbumin today which is overdue  Hypertension: Blood pressure is controlled with current regimen including Aldactone  EDEMA: Controlled with Lasix and Aldactone  Hypokalemia: Secondary to  diuretics including Lasix, now managed better with taking Aldactone and stopping potassium supplements  She does need to follow-up with PCP for general care  Multinodular goiter: To have follow-up TSH after stopping levothyroxine  Medication management: The daughter was present and her medication changes were reviewed in detail   [facility] 10/17/2014, 9:07 PM

## 2014-10-17 NOTE — Progress Notes (Signed)
Quick Note:  Please let patient know that the potassium is okay, kidney test better. Stop metformin for now  ______

## 2014-10-17 NOTE — Assessment & Plan Note (Signed)
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 ER positive PR positive HER-2 negative Ki-67 11%. Patient was hospitalized for left hip fracture 02/25/2014, completed adjuvant radiation therapy 06/29/14, Started Tamoxifen 07/20/14  Tamoxifen Toxicities:  Breast Cancer Surveillance: 1. Breast exam 10/18/14: Normal 2. Mammogram: Will need mammograms for surveillance  RTC in 6 months

## 2014-10-18 ENCOUNTER — Ambulatory Visit (HOSPITAL_BASED_OUTPATIENT_CLINIC_OR_DEPARTMENT_OTHER): Payer: Medicare Other | Admitting: Hematology and Oncology

## 2014-10-18 ENCOUNTER — Encounter: Payer: Self-pay | Admitting: Hematology and Oncology

## 2014-10-18 ENCOUNTER — Encounter: Payer: Self-pay | Admitting: *Deleted

## 2014-10-18 ENCOUNTER — Telehealth: Payer: Self-pay | Admitting: Hematology and Oncology

## 2014-10-18 VITALS — BP 120/65 | HR 56 | Temp 98.7°F | Resp 18 | Ht 67.0 in | Wt 178.3 lb

## 2014-10-18 DIAGNOSIS — Z79810 Long term (current) use of selective estrogen receptor modulators (SERMs): Secondary | ICD-10-CM

## 2014-10-18 DIAGNOSIS — Z853 Personal history of malignant neoplasm of breast: Secondary | ICD-10-CM

## 2014-10-18 DIAGNOSIS — C50411 Malignant neoplasm of upper-outer quadrant of right female breast: Secondary | ICD-10-CM

## 2014-10-18 NOTE — Progress Notes (Signed)
Patient Care Team: [name], MD as PCP - General (Family Medicine) Nicholas Lose, MD as Consulting Physician (Hematology and Oncology) [name], MD as Consulting Physician (General Surgery) [name], MD as Consulting Physician (Radiation Oncology)  DIAGNOSIS: Breast cancer of upper-outer quadrant of right female breast   Staging form: Breast, AJCC 7th Edition     Clinical: Stage IIB (T2, N1, cM0) - Signed by [name], MD on 11/01/2013     Pathologic: Stage IIIA (T1c, N2a, cM0) - Unsigned   SUMMARY OF ONCOLOGIC HISTORY:   Breast cancer of upper-outer quadrant of right female breast   10/20/2013 Mammogram Ultrasound and mammogram showed 2.1 x 2.1 x 1.9 cm right breast mass   10/20/2013 Initial Diagnosis Breast cancer of upper-outer quadrant of right female breast. Invasive ductal cancer with lymphovascular invasion one lymph node biopsy that was positive for cancer grade 1; Her 2 Neg Ratio 0.96, ER100% PR 8% positive Ki 67: 11%;   10/31/2013 Breast MRI Right breast upper outer quadrant: 3.1 x 1.9 x 2.2 cm: Right axillary lymph node 1.1 x 0.9 x 0.6 cm   11/16/2013 - 12/01/2013 Chemotherapy Neoadjuvant dose dense Doxorubicin and Cyclophosphamide given on day 1 of a 14 day cycle with Neulasta given on day 2 for granulocyte support   01/09/2014 Surgery Right breast lumpectomy: Invasive ductal carcinoma, grade 2, 1.8 cm with DCIS intermediate grade, lymphovascular invasion diffusely, 5 of 13 lymph nodes positive with extracapsular extension, ER positive, PR 80%, HER-2 negative ratio 0.96, Ki-67 11%   02/25/2014 - 02/28/2014 Hospital Admission Left hip fracture as a result of a fall at home   05/15/2014 - 06/29/2014 Radiation Therapy Adjuvant radiation therapy with Dr. Pablo Ledger   07/20/2014 -  Anti-estrogen oral therapy Tamoxifen 20 mg daily    CHIEF COMPLIANT: Follow uo on Tamoxifen  INTERVAL HISTORY: Whitney [name] is a 81 -year-old with the above-mentioned history of right  breast cancer currently on tamoxifen therapy. She is here for toxicity check. She reports to be doing very well. She does not have any side effects of tamoxifen. Denies any hot flashes or myalgias. She is due for mammograms.  REVIEW OF SYSTEMS:   Constitutional: Denies fevers, chills or abnormal weight loss Eyes: Denies blurriness of vision Ears, nose, mouth, throat, and face: Denies mucositis or sore throat Respiratory: Denies cough, dyspnea or wheezes Cardiovascular: Denies palpitation, chest discomfort or lower extremity swelling Gastrointestinal:  Denies nausea, heartburn or change in bowel habits Skin: Denies abnormal skin rashes Lymphatics: Denies new lymphadenopathy or easy bruising Neurological:Denies numbness, tingling or new weaknesses Behavioral/Psych: Mood is stable, no new changes  Breast:  denies any pain or lumps or nodules in either breasts All other systems were reviewed with the patient and are negative.  I have reviewed the past medical history, past surgical history, social history and family history with the patient and they are unchanged from previous note.  ALLERGIES:  has No Known Allergies.  MEDICATIONS:  Current Outpatient Prescriptions  Medication Sig Dispense Refill  . amiodarone (PACERONE) 200 MG tablet TAKE 2 TABS TWICE DAILY FOR 1 WEEK, THEN 1 TAB TWICE A DAY FOR 1 WEEK, THEN 1 TAB DAILY 30 tablet 1  . amLODipine (NORVASC) 5 MG tablet TAKE 1 TABLET (5 MG TOTAL) BY MOUTH DAILY. 30 tablet 0  . apixaban (ELIQUIS) 5 MG TABS tablet Take 1 tablet (5 mg total) by mouth 2 (two) times daily. 60 tablet 11  . atorvastatin (LIPITOR) 10 MG tablet Take 10 mg  by mouth daily.    . Cholecalciferol (VITAMIN D) 2000 UNITS CAPS Take 2,000 Units by mouth daily.    Marland Kitchen docusate sodium 100 MG CAPS Take 100 mg by mouth 2 (two) times daily. 10 capsule 0  . ferrous sulfate 325 (65 FE) MG EC tablet Take 325 mg by mouth daily with breakfast.    . furosemide (LASIX) 40 MG tablet Take 1  tablet (40 mg total) by mouth daily. 30 tablet   . glipiZIDE (GLIPIZIDE XL) 2.5 MG 24 hr tablet Take 1 tablet (2.5 mg total) by mouth daily with breakfast. 30 tablet 3  . glucose blood (ONETOUCH VERIO) test strip Use as instructed to check blood sugar once a day dx code E11.9 50 each 3  . lisinopril (PRINIVIL,ZESTRIL) 20 MG tablet Take 20 mg by mouth daily.  5  . metFORMIN (GLUCOPHAGE-XR) 500 MG 24 hr tablet TAKE 1 TABLET EVERY MORNING AND 2 TABLETS IN THE EVENING (Patient taking differently: 1 TABLETS IN THE EVENING) 270 tablet 1  . metoprolol (LOPRESSOR) 100 MG tablet Take 100 mg by mouth 2 (two) times daily.    Glory Rosebush DELICA LANCETS 85I MISC Use to check blood sugar once a day dx code E11.9 100 each 1  . Polyethyl Glycol-Propyl Glycol (SYSTANE OP) Place 1 drop into both eyes 3 (three) times daily.    . potassium chloride SA (K-DUR,KLOR-CON) 20 MEQ tablet Take 1 tablet (20 mEq total) by mouth once. X 5 days 5 tablet 0  . spironolactone (ALDACTONE) 25 MG tablet Take 1 tablet (25 mg total) by mouth daily. 90 tablet 3  . tamoxifen (NOLVADEX) 20 MG tablet Take 1 tablet (20 mg total) by mouth daily. 90 tablet 3  . vitamin B-12 (CYANOCOBALAMIN) 1000 MCG tablet Take 1,000 mcg by mouth daily.     No current facility-administered medications for this visit.    PHYSICAL EXAMINATION: ECOG PERFORMANCE STATUS: 0 - Asymptomatic  Filed Vitals:   10/18/14 0932  BP: 120/65  Pulse: 56  Temp: 98.7 F (37.1 C)  Resp: 18   Filed Weights   10/18/14 0932  Weight: 178 lb 4.8 oz (80.876 kg)    GENERAL:alert, no distress and comfortable SKIN: skin color, texture, turgor are normal, no rashes or significant lesions EYES: normal, Conjunctiva are pink and non-injected, sclera clear OROPHARYNX:no exudate, no erythema and lips, buccal mucosa, and tongue normal  NECK: supple, thyroid normal size, non-tender, without nodularity LYMPH:  no palpable lymphadenopathy in the cervical, axillary or  inguinal LUNGS: clear to auscultation and percussion with normal breathing effort HEART: regular rate & rhythm and no murmurs and no lower extremity edema ABDOMEN:abdomen soft, non-tender and normal bowel sounds Musculoskeletal:no cyanosis of digits and no clubbing  NEURO: alert & oriented x 3 with fluent speech, no focal motor/sensory deficits BREAST: No palpable masses or nodules in either right or left breasts. No palpable axillary supraclavicular or infraclavicular adenopathy no breast tenderness or nipple discharge. (exam performed in the presence of a chaperone)  LABORATORY DATA:  I have reviewed the data as listed   Chemistry      Component Value Date/Time   NA 141 10/17/2014 1348   NA 141 12/22/2013 1000   K 3.8 10/17/2014 1348   K 3.1* 12/22/2013 1000   CL 106 10/17/2014 1348   CO2 30 10/17/2014 1348   CO2 30* 12/22/2013 1000   BUN 28* 10/17/2014 1348   BUN 26.0 12/22/2013 1000   CREATININE 1.48* 10/17/2014 1348   CREATININE 1.20* 07/27/2014  5093   CREATININE 1.3* 12/22/2013 1000      Component Value Date/Time   CALCIUM 9.3 10/17/2014 1348   CALCIUM 10.0 12/22/2013 1000   ALKPHOS 51 10/17/2014 1348   ALKPHOS 66 12/22/2013 1000   AST 27 10/17/2014 1348   AST 11 12/22/2013 1000   ALT 28 10/17/2014 1348   ALT 8 12/22/2013 1000   BILITOT 0.4 10/17/2014 1348   BILITOT 0.29 12/22/2013 1000       Lab Results  Component Value Date   WBC 4.3 07/27/2014   HGB 10.0* 07/27/2014   HCT 31.0* 07/27/2014   MCV 87.1 07/27/2014   PLT 203 07/27/2014   NEUTROABS 3.3 07/27/2014   ASSESSMENT & PLAN:  Breast cancer of upper-outer quadrant of right female breast T1 CN2A M0 stage IIIa invasive ductal carcinoma right breast status post lumpectomy 5/13 lymph nodes positive with lymphovascular invasion and extracapsular extension. ER positive PR positive HER-2 negative Ki-67 11%. Patient was hospitalized for left hip fracture 02/25/2014, completed adjuvant radiation therapy 06/29/14,  Started Tamoxifen 07/20/14  Tamoxifen Toxicities:no toxicities tamoxifen. Denies any hot flashes or myalgias.  Breast Cancer Surveillance: 1. Breast exam 10/18/14: Normal 2. Mammogram: Will need annual mammograms for surveillance. We will order this.  RTC in 6 months   No orders of the defined types were placed in this encounter.   The patient has a good understanding of the overall plan. she agrees with it. she will call with any problems that may develop before the next visit here.   [name], MD

## 2014-10-18 NOTE — Telephone Encounter (Signed)
Gave avs & calendar for February. °

## 2014-10-19 ENCOUNTER — Other Ambulatory Visit: Payer: Self-pay | Admitting: Endocrinology

## 2014-10-19 ENCOUNTER — Ambulatory Visit: Payer: Medicare Other | Admitting: Family Medicine

## 2014-10-19 ENCOUNTER — Ambulatory Visit: Payer: Medicare Other | Admitting: Hematology and Oncology

## 2014-10-23 ENCOUNTER — Ambulatory Visit (INDEPENDENT_AMBULATORY_CARE_PROVIDER_SITE_OTHER): Payer: Medicare Other | Admitting: Family Medicine

## 2014-10-23 ENCOUNTER — Encounter: Payer: Self-pay | Admitting: Family Medicine

## 2014-10-23 VITALS — BP 128/58 | HR 74 | Temp 98.4°F | Resp 16 | Ht 67.0 in | Wt 180.0 lb

## 2014-10-23 DIAGNOSIS — R600 Localized edema: Secondary | ICD-10-CM | POA: Diagnosis not present

## 2014-10-23 DIAGNOSIS — N183 Chronic kidney disease, stage 3 unspecified: Secondary | ICD-10-CM

## 2014-10-23 NOTE — Progress Notes (Signed)
Subjective:    Patient ID: Whitney [name], female    DOB: Jan 16, 1928, 81 y.o.   MRN: [id number]  HPI 09/28/14 Patient was recently seen by her endocrinologist. According to the patient he started her on spironolactone and discontinued her furosemide. She is also on Benicar HCT. Patient is unsure why the doctor did this. Her labwork recently revealed hypokalemia with a rise in her creatinine from 1.2-1.6. Patient now has bilateral tense pitting edema that is 2+ from her ankles to her knees. Patient had an echocardiogram of her heart in September of last year which revealed an ejection fraction of 60%.  She also has diabetes. At present her hemoglobin A1c is 6.5 when checked in May. She is currently on Actos as well as glipizide.  She is also taking amlodipine.  At that time, my plan was:  I believe this is likely multifactorial and related to patient taking Actos as well as amlodipine and the recent discontinuation of Lasix. I did not like the patient taking Lasix and hydrochlorothiazide together. I was unaware that she was doing this. This may have caused the rise in her creatinine. I will see the patient back on Thursday. I will have her temporarily discontinue Actos to see if this will improve her leg swelling. I will replace it with Januvia 100 mg a day. If on Thursday her leg swelling is better, I will discontinue hydrochlorothiazide I will continue the Benicar I will discontinue spironolactone and I will start the patient back on her Lasix and then recheck her renal function in 2 weeks  10/04/14 Since I last saw the patient, she has lost 2 pounds. The swelling is only slightly better in her legs. She still has +2 edema in her legs to the knee. Her legs are sore and tender from the swelling.  At that time, my plan was: Discontinue lisinopril/hydrochlorothiazide. Replace with lisinopril 20 mg a day. Resume Lasix 40 mg by mouth daily. Begin to wear knee-high compression stockings 15-20 mm, continue  to refrain from using Actos and continue to continue his Januvia 100 mg by mouth daily. Recheck kidney function and potassium in 2 weeks  10/23/14 Patient has lost 13 pounds since when I last saw her. She has been compliant wearing her compression stockings as well as taking her Lasix. Her blood pressure today is 128/58. There is no pitting edema in her legs. There is no shortness of breath. She denies any dry mouth and dizziness or lightheadedness. She does not appear dehydrated   Past Medical History  Diagnosis Date  . Hypertension   . Hyperlipidemia   . Diabetes mellitus without complication   . Thyroid disease     hypothyroidism  . Anemia   . Former smoker   . Multinodular goiter   . Complication of anesthesia     slow to wake up  . Cancer     right breast  . Wears glasses   . Full dentures   . Dysrhythmia 10/15    AF  . Breast cancer 10/2013    right upper outer  . Atrial fibrillation   . Radiation     Right Breast   Past Surgical History  Procedure Laterality Date  . Abdominal hysterectomy    . Portacath placement N/A 11/15/2013    Procedure: INSERTION PORT-A-CATH WITH ULTRA SOUND AND FLOROSCOPY;  Surgeon: [name], MD;  Location: [facility];  Service: General;  Laterality: N/A;  . Eye surgery  12/22/2009    cataracts  .  Total knee arthroplasty  2002    left  . Port-a-cath removal Right 01/09/2014    Procedure: REMOVAL PORT-A-CATH;  Surgeon: [name], MD;  Location: [facility];  Service: General;  Laterality: Right;  . Breast lumpectomy with radioactive seed localization Right 01/09/2014    Procedure: RIGHT BREAST SEED LOCALIZED LUMPECTOMY ;  Surgeon: [name], MD;  Location: [facility];  Service: General;  Laterality: Right;  . Axillary lymph node dissection Right 01/09/2014    Procedure: RIGHT AXILLARY LYMPH NODE DISECTION;  Surgeon: [name], MD;  Location: [facility];  Service: General;  Laterality:  Right;  . Intramedullary (im) nail intertrochanteric Left 02/24/2014    Procedure: IM ROD LEFT HIP FX;  Surgeon: Alta Corning, MD;  Location: [facility];  Service: Orthopedics;  Laterality: Left;   Current Outpatient Prescriptions on File Prior to Visit  Medication Sig Dispense Refill  . amiodarone (PACERONE) 200 MG tablet TAKE 2 TABS TWICE DAILY FOR 1 WEEK, THEN 1 TAB TWICE A DAY FOR 1 WEEK, THEN 1 TAB DAILY 30 tablet 1  . amLODipine (NORVASC) 5 MG tablet TAKE 1 TABLET (5 MG TOTAL) BY MOUTH DAILY. 30 tablet 0  . apixaban (ELIQUIS) 5 MG TABS tablet Take 1 tablet (5 mg total) by mouth 2 (two) times daily. 60 tablet 11  . atorvastatin (LIPITOR) 10 MG tablet Take 10 mg by mouth daily.    Marland Kitchen atorvastatin (LIPITOR) 10 MG tablet TAKE 1 TABLET (10 MG TOTAL) BY MOUTH DAILY. 90 tablet 1  . Cholecalciferol (VITAMIN D) 2000 UNITS CAPS Take 2,000 Units by mouth daily.    Marland Kitchen docusate sodium 100 MG CAPS Take 100 mg by mouth 2 (two) times daily. 10 capsule 0  . ferrous sulfate 325 (65 FE) MG EC tablet Take 325 mg by mouth daily with breakfast.    . furosemide (LASIX) 40 MG tablet Take 1 tablet (40 mg total) by mouth daily. 30 tablet   . glipiZIDE (GLIPIZIDE XL) 2.5 MG 24 hr tablet Take 1 tablet (2.5 mg total) by mouth daily with breakfast. 30 tablet 3  . glucose blood (ONETOUCH VERIO) test strip Use as instructed to check blood sugar once a day dx code E11.9 50 each 3  . lisinopril (PRINIVIL,ZESTRIL) 20 MG tablet Take 20 mg by mouth daily.  5  . metFORMIN (GLUCOPHAGE-XR) 500 MG 24 hr tablet TAKE 1 TABLET EVERY MORNING AND 2 TABLETS IN THE EVENING (Patient taking differently: 1 TABLETS IN THE EVENING) 270 tablet 1  . metoprolol (LOPRESSOR) 100 MG tablet Take 100 mg by mouth 2 (two) times daily.    Glory Rosebush DELICA LANCETS 53Z MISC Use to check blood sugar once a day dx code E11.9 100 each 1  . Polyethyl Glycol-Propyl Glycol (SYSTANE OP) Place 1 drop into both eyes 3 (three) times daily.    . potassium chloride SA  (K-DUR,KLOR-CON) 20 MEQ tablet Take 1 tablet (20 mEq total) by mouth once. X 5 days 5 tablet 0  . spironolactone (ALDACTONE) 25 MG tablet Take 1 tablet (25 mg total) by mouth daily. 90 tablet 3  . tamoxifen (NOLVADEX) 20 MG tablet Take 1 tablet (20 mg total) by mouth daily. 90 tablet 3  . vitamin B-12 (CYANOCOBALAMIN) 1000 MCG tablet Take 1,000 mcg by mouth daily.     No current facility-administered medications on file prior to visit.   No Known Allergies History   Social History  . Marital Status: Widowed    Spouse  Name: N/A  . Number of Children: 4  . Years of Education: N/A   Occupational History  . Not on file.   Social History Main Topics  . Smoking status: Former Smoker -- 1.00 packs/day for 5 years    Quit date: 11/14/1958  . Smokeless tobacco: Never Used  . Alcohol Use: No  . Drug Use: No  . Sexual Activity: No     Comment: menarche age 41, P89, first birth age 83, no HRT, menopause age 66   Other Topics Concern  . Not on file   Social History Narrative      Review of Systems  All other systems reviewed and are negative.      Objective:   Physical Exam  Neck: No JVD present.  Cardiovascular: Normal rate and normal heart sounds.   Pulmonary/Chest: Effort normal and breath sounds normal. No respiratory distress. She has no wheezes. She has no rales.  Abdominal: Soft. Bowel sounds are normal.  Musculoskeletal: She exhibits no edema.  Vitals reviewed.         Assessment & Plan:  CKD (chronic kidney disease) stage 3, GFR 30-59 ml/min - Plan: BASIC METABOLIC PANEL WITH GFR  Bilateral leg edema  Clinically the patient has improved dramatically. There is no pitting edema. Her blood pressure stable. She has lost 13 pounds of excess fluid since discontinuing Actos, beginning the compression stockings, and starting back on the Lasix. However I need to monitor her kidney function to make sure that she is not developing prerenal azotemia. I also want to monitor  her potassium. I recommended that she weigh herself every day and she continues to lose weight, we will need to stop the diaphoretic still avoid dehydration.

## 2014-10-24 ENCOUNTER — Other Ambulatory Visit: Payer: Self-pay

## 2014-10-24 LAB — BASIC METABOLIC PANEL WITH GFR
BUN: 22 mg/dL (ref 7–25)
CO2: 26 mmol/L (ref 20–31)
Calcium: 9.1 mg/dL (ref 8.6–10.4)
Chloride: 111 mmol/L — ABNORMAL HIGH (ref 98–110)
Creat: 1.24 mg/dL — ABNORMAL HIGH (ref 0.60–0.88)
GFR, Est African American: 46 mL/min — ABNORMAL LOW (ref >=60)
GFR, Est Non African American: 40 mL/min — ABNORMAL LOW (ref >=60)
Glucose, Bld: 100 mg/dL — ABNORMAL HIGH (ref 70–99)
Potassium: 4.3 mmol/L (ref 3.5–5.3)
Sodium: 144 mmol/L (ref 135–146)

## 2014-10-25 ENCOUNTER — Telehealth: Payer: Self-pay | Admitting: Hematology and Oncology

## 2014-10-25 NOTE — Telephone Encounter (Signed)
Mailed a calendar and letter to patient with [facility] appointment

## 2014-10-26 ENCOUNTER — Other Ambulatory Visit: Payer: Self-pay | Admitting: Endocrinology

## 2014-10-31 ENCOUNTER — Ambulatory Visit
Admission: RE | Admit: 2014-10-31 | Discharge: 2014-10-31 | Disposition: A | Discharge status: Home / Self Care | Payer: Medicare Other | Source: Ambulatory Visit | Attending: Hematology and Oncology | Admitting: Hematology and Oncology

## 2014-10-31 ENCOUNTER — Other Ambulatory Visit: Payer: Self-pay | Admitting: Hematology and Oncology

## 2014-10-31 DIAGNOSIS — Z853 Personal history of malignant neoplasm of breast: Secondary | ICD-10-CM | POA: Diagnosis not present

## 2014-10-31 DIAGNOSIS — R928 Other abnormal and inconclusive findings on diagnostic imaging of breast: Secondary | ICD-10-CM | POA: Diagnosis not present

## 2014-10-31 DIAGNOSIS — C50411 Malignant neoplasm of upper-outer quadrant of right female breast: Secondary | ICD-10-CM

## 2014-11-08 ENCOUNTER — Other Ambulatory Visit: Payer: Self-pay | Admitting: Endocrinology

## 2014-11-15 ENCOUNTER — Other Ambulatory Visit: Payer: Self-pay | Admitting: Endocrinology

## 2014-11-26 ENCOUNTER — Other Ambulatory Visit: Payer: Self-pay | Admitting: Family Medicine

## 2014-11-28 ENCOUNTER — Other Ambulatory Visit: Payer: Self-pay | Admitting: Endocrinology

## 2014-12-17 ENCOUNTER — Other Ambulatory Visit (INDEPENDENT_AMBULATORY_CARE_PROVIDER_SITE_OTHER): Payer: Medicare Other

## 2014-12-17 DIAGNOSIS — E119 Type 2 diabetes mellitus without complications: Secondary | ICD-10-CM

## 2014-12-17 LAB — COMPREHENSIVE METABOLIC PANEL
ALT: 37 U/L — ABNORMAL HIGH (ref 0–35)
AST: 32 U/L (ref 0–37)
Albumin: 3.7 g/dL (ref 3.5–5.2)
Alkaline Phosphatase: 59 U/L (ref 39–117)
BUN: 43 mg/dL — ABNORMAL HIGH (ref 6–23)
CO2: 27 mEq/L (ref 19–32)
Calcium: 9.4 mg/dL (ref 8.4–10.5)
Chloride: 107 mEq/L (ref 96–112)
Creatinine, Ser: 1.72 mg/dL — ABNORMAL HIGH (ref 0.40–1.20)
GFR: 36.26 mL/min — ABNORMAL LOW (ref >60.00)
Glucose, Bld: 90 mg/dL (ref 70–99)
Potassium: 4.9 mEq/L (ref 3.5–5.1)
Sodium: 140 mEq/L (ref 135–145)
Total Bilirubin: 0.5 mg/dL (ref 0.2–1.2)
Total Protein: 6.4 g/dL (ref 6.0–8.3)

## 2014-12-17 LAB — HEMOGLOBIN A1C: Hgb A1c MFr Bld: 5.5 % (ref 4.6–6.5)

## 2014-12-21 ENCOUNTER — Encounter: Payer: Self-pay | Admitting: Endocrinology

## 2014-12-21 ENCOUNTER — Ambulatory Visit (INDEPENDENT_AMBULATORY_CARE_PROVIDER_SITE_OTHER): Payer: Medicare Other | Admitting: Endocrinology

## 2014-12-21 VITALS — BP 116/68 | HR 50 | Temp 98.2°F | Resp 16 | Ht 65.0 in | Wt 180.6 lb

## 2014-12-21 DIAGNOSIS — R7989 Other specified abnormal findings of blood chemistry: Secondary | ICD-10-CM | POA: Diagnosis not present

## 2014-12-21 DIAGNOSIS — N289 Disorder of kidney and ureter, unspecified: Secondary | ICD-10-CM

## 2014-12-21 DIAGNOSIS — R609 Edema, unspecified: Secondary | ICD-10-CM

## 2014-12-21 DIAGNOSIS — Z23 Encounter for immunization: Secondary | ICD-10-CM | POA: Diagnosis not present

## 2014-12-21 DIAGNOSIS — E042 Nontoxic multinodular goiter: Secondary | ICD-10-CM

## 2014-12-21 DIAGNOSIS — E119 Type 2 diabetes mellitus without complications: Secondary | ICD-10-CM | POA: Diagnosis not present

## 2014-12-21 NOTE — Progress Notes (Signed)
Patient ID: Whitney [name], female   DOB: January 06, 1928, 81 y.o.   MRN: [id number]   Reason for Appointment: Diabetes and other problems  History of Present Illness    Type 2 DIABETES MELITUS, date of diagnosis:  1983     Previous history: She had been taking metformin and Actos for several years.  She usually has had excellent control with upper normal A1c  Recent history:   Oral hypoglycemic drugs: glipizide ER 2.5 mg daily    She was previously on  Metformin and Actos; because of edema she was told to stop Actos She was started on glipizide ER 2.5 mg daily instead   Since her blood sugars were relatively good on glipizide she was told not to start Januvia prescribed by PCP   Also recently because of relevant dysfunction she was told to stop metformin   She has started taking her blood sugars regularly with the One Touch monitor but is doing these mostly after supper,sometimes one hour after eating Her lab glucose in the morning was 90 Her weight has stayed stable       Monitors blood glucose: ?  Once a day .    Glucometer: One Touch.          Blood Glucose readings from download show recent readings  Ranging from 98-184 , median 129  Hypoglycemia:  none        Physical activity: exercise: Only walking around the house       Diet: She stays away from fast food, sometimes will have some light lemonade                Wt Readings from Last 3 Encounters:  12/21/14 180 lb 9.6 oz (81.92 kg)  10/23/14 180 lb (81.647 kg)  10/18/14 178 lb 4.8 oz (80.876 kg)    LABS:  Lab Results  Component Value Date   HGBA1C 5.5 12/17/2014   HGBA1C 6.5* 07/27/2014   HGBA1C 5.8 02/19/2014   Lab Results  Component Value Date   MICROALBUR 2.7* 09/19/2014   Odin 91 07/27/2014   CREATININE 1.72* 12/17/2014     OTHER problems including renal dysfunction are discussed in review of systems      Medication List       This list is accurate as of: 12/21/14  1:31 PM.  Always  use your most recent med list.               amiodarone 200 MG tablet  Commonly known as:  PACERONE  TAKE 2 TABS TWICE DAILY FOR 1 WEEK, THEN 1 TAB TWICE A DAY FOR 1 WEEK, THEN 1 TAB DAILY     amLODipine 5 MG tablet  Commonly known as:  NORVASC  TAKE 1 TABLET (5 MG TOTAL) BY MOUTH DAILY.     atorvastatin 10 MG tablet  Commonly known as:  LIPITOR  Take 10 mg by mouth daily.     DSS 100 MG Caps  Take 100 mg by mouth 2 (two) times daily.     ELIQUIS 5 MG Tabs tablet  Generic drug:  apixaban  TAKE 1 TABLET TWICE A DAY     ferrous sulfate 325 (65 FE) MG EC tablet  Take 325 mg by mouth daily with breakfast.     furosemide 40 MG tablet  Commonly known as:  LASIX  Take 1 tablet (40 mg total) by mouth daily.     glipiZIDE 2.5 MG 24 hr tablet  Commonly known as:  GLIPIZIDE XL  Take 1 tablet (2.5 mg total) by mouth daily with breakfast.     glucose blood test strip  Commonly known as:  ONETOUCH VERIO  Use as instructed to check blood sugar once a day dx code E11.9     lisinopril 20 MG tablet  Commonly known as:  PRINIVIL,ZESTRIL  Take 20 mg by mouth daily.     lisinopril-hydrochlorothiazide 20-12.5 MG tablet  Commonly known as:  PRINZIDE,ZESTORETIC  TAKE 1 TABLET BY MOUTH DAILY.     metFORMIN 500 MG 24 hr tablet  Commonly known as:  GLUCOPHAGE-XR  TAKE 1 TABLET EVERY MORNING AND 2 TABLETS IN THE EVENING     metoprolol 100 MG tablet  Commonly known as:  LOPRESSOR  Take 100 mg by mouth 2 (two) times daily.     ONETOUCH DELICA LANCETS 13Y Misc  Use to check blood sugar once a day dx code E11.9     pioglitazone 30 MG tablet  Commonly known as:  ACTOS  TAKE 1 TABLET BY MOUTH EVERY DAY     potassium chloride SA 20 MEQ tablet  Commonly known as:  K-DUR,KLOR-CON  Take 1 tablet (20 mEq total) by mouth once. X 5 days     spironolactone 25 MG tablet  Commonly known as:  ALDACTONE  Take 1 tablet (25 mg total) by mouth daily.     SYSTANE OP  Place 1 drop into both eyes  3 (three) times daily.     tamoxifen 20 MG tablet  Commonly known as:  NOLVADEX  Take 1 tablet (20 mg total) by mouth daily.     vitamin B-12 1000 MCG tablet  Commonly known as:  CYANOCOBALAMIN  Take 1,000 mcg by mouth daily.     Vitamin D 2000 UNITS Caps  Take 2,000 Units by mouth daily.        Allergies: No Known Allergies  Past Medical History  Diagnosis Date  . Hypertension   . Hyperlipidemia   . Diabetes mellitus without complication ([place])   . Thyroid disease     hypothyroidism  . Anemia   . Former smoker   . Multinodular goiter   . Complication of anesthesia     slow to wake up  . Cancer ([place])     right breast  . Wears glasses   . Full dentures   . Dysrhythmia 10/15    AF  . Breast cancer ([place]) 10/2013    right upper outer  . Atrial fibrillation ([place])   . Radiation     Right Breast    Past Surgical History  Procedure Laterality Date  . Abdominal hysterectomy    . Portacath placement N/A 11/15/2013    Procedure: INSERTION PORT-A-CATH WITH ULTRA SOUND AND FLOROSCOPY;  Surgeon: [name], MD;  Location: [facility];  Service: General;  Laterality: N/A;  . Eye surgery  12/22/2009    cataracts  . Total knee arthroplasty  2002    left  . Port-a-cath removal Right 01/09/2014    Procedure: REMOVAL PORT-A-CATH;  Surgeon: [name], MD;  Location: [facility];  Service: General;  Laterality: Right;  . Breast lumpectomy with radioactive seed localization Right 01/09/2014    Procedure: RIGHT BREAST SEED LOCALIZED LUMPECTOMY ;  Surgeon: [name], MD;  Location: [facility];  Service: General;  Laterality: Right;  . Axillary lymph node dissection Right 01/09/2014    Procedure: RIGHT AXILLARY LYMPH NODE DISECTION;  Surgeon: [name], MD;  Location: [facility];  Service: General;  Laterality: Right;  . Intramedullary (im) nail intertrochanteric Left 02/24/2014    Procedure: IM ROD LEFT HIP FX;  Surgeon: Alta Corning, MD;  Location: [facility];  Service: Orthopedics;  Laterality: Left;    No family history on file.  Social History:  reports that she quit smoking about 56 years ago. She has never used smokeless tobacco. She reports that she does not drink alcohol or use illicit drugs.  Review of Systems:  Hypertension: She usually has had good control, now taking lisinopril 20 mg along with Aldactone 25 mg and is also taking amlodipine 5 mg daily  EDEMA She is taking Lasix 40 for edema and edema is better with adding 25 Aldactone Potassium is normal with starting Aldactone and she is off potassium supplements Last potassium was 4.9 as below  Renal function: Creatinine has gone up significantly, no changes in medication made on the last visit or with her PCP  Lab Results  Component Value Date   CREATININE 1.72* 12/17/2014   BUN 43* 12/17/2014   NA 140 12/17/2014   K 4.9 12/17/2014   CL 107 12/17/2014   CO2 27 12/17/2014    Lipids: Has been on long-term Lipitor with good control    Lab Results  Component Value Date   CHOL 179 07/27/2014   HDL 74 07/27/2014   LDLCALC 91 07/27/2014   TRIG 68 07/27/2014   CHOLHDL 2.4 07/27/2014    She has had vitamin D deficiency, her level was 35 previously with 2000 units vitamin D 3  HYPOTHYROIDISM: She has had a multinodular goiter and not clear if she is truly hypothyroid. Over the last couple of years her dose has been tapered down because of low normal TSH levels and has not had any increase in the size of her goiter.   She is compliant with her thyroid medication in the morning before breakfast. No fatigue  TSH levels have been low normal and she was told to stop taking levothyroxine    Lab Results  Component Value Date   FREET4 1.42 11/08/2013   FREET4 1.37 11/24/2012   TSH 0.249* 07/27/2014   TSH 0.549 01/30/2014   TSH 0.551 12/08/2013   Last foot exam in 7/16  ADRENAL mass: She was evaluated by a PET scan after  diagnosis of breast  cancer She was found to have a 3.7 cm hypermetabolic left adrenal mass  On a previous CT scan in 2011 she also had a 3.8 cm adrenal mass Cortisol level is normal  Examination:   BP 116/68 mmHg  Pulse 50  Temp(Src) 98.2 F (36.8 C)  Resp 16  Ht 5\' 5"  (1.651 m)  Wt 180 lb 9.6 oz (81.92 kg)  BMI 30.05 kg/m2  SpO2 98%  Body mass index is 30.05 kg/(m^2).   No significant pedal edema Repeat blood pressure standing up 120/65 left arm Right arm not evaluated, has a sleeve for her lymphedema  She has a 2-2.5 x soft nodular enlargement of the thyroid in the right side and just palpable on the left  ASSESSMENT/ PLAN:  Diabetes type 2   Blood glucose control is fairly good with glipizide ER 2.5 mg alone A1c is near-normal at 5.5 and has fairly good readings after meals, fasting glucose normal in the lab Discussed having balanced meals and checking blood sugars at different times of the day instead of just after supper Encouraged her to be more active also for weight loss  RENAL insufficiency:  Creatinine is significantly higher without any change in her regimen This may be related to her continuing to take diuretics along with 20 mg lisinopril Does not have diabetic nephropathy Blood pressure appears to be low normal  Hypertension: Blood pressure is low normal with current regimen including Aldactone and lisinopril  EDEMA: Controlled with Lasix and Aldactone  Hypokalemia: resolved, potassium is high normal possibly from her renal function worse  Recommendations:  Stop Aldactone for now  Reduce lisinopril 20 mg to half tablet  Patient's daughter will review medications at home and make sure the list is accurate  She does need to follow-up with PCP for follow-up in one month  Multinodular goiter: stable the last TSH was low. To have follow-up TSH on the next visit  Total visit time for detailed History, exam, coordination of care, review of labs and blood sugar monitor  download = 25 minutes  Breeanne Oblinger 12/21/2014, 1:31 PM

## 2014-12-21 NOTE — Patient Instructions (Addendum)
Reduce LISINOPRIL TO 1/2  STOP SPRINOLACTONE  See PCP in 4 weeks  Check blood sugars on waking up .Marland Kitchen1-2 .. times a week Also check blood sugars about 2 hours after a meal and do this after different meals by rotation  Recommended blood sugar levels on waking up is 90-130 and about 2 hours after meal is 140-180 Please bring blood sugar monitor to each visit.

## 2015-01-04 DIAGNOSIS — H524 Presbyopia: Secondary | ICD-10-CM | POA: Diagnosis not present

## 2015-01-04 DIAGNOSIS — H52223 Regular astigmatism, bilateral: Secondary | ICD-10-CM | POA: Diagnosis not present

## 2015-01-04 DIAGNOSIS — H5203 Hypermetropia, bilateral: Secondary | ICD-10-CM | POA: Diagnosis not present

## 2015-01-04 DIAGNOSIS — E119 Type 2 diabetes mellitus without complications: Secondary | ICD-10-CM | POA: Diagnosis not present

## 2015-01-04 LAB — HM DIABETES EYE EXAM

## 2015-01-08 ENCOUNTER — Encounter: Payer: Self-pay | Admitting: *Deleted

## 2015-01-10 ENCOUNTER — Telehealth: Payer: Self-pay | Admitting: Family Medicine

## 2015-01-10 MED ORDER — SITAGLIPTIN PHOSPHATE 100 MG PO TABS: 100.0000 mg | ORAL_TABLET | Freq: Every day | ORAL | Status: DC

## 2015-01-10 NOTE — Telephone Encounter (Addendum)
PHARMACY: [facility]   MEDICATION: JANUVIA  100mg   QTY:    SIG:    PHYSICIAN: PICKARD   PT. PHONE #: [phone]  Pt was given samples of Januvia but would like a refill.

## 2015-01-10 NOTE — Telephone Encounter (Signed)
Medication called/sent to requested pharmacy  

## 2015-01-13 ENCOUNTER — Other Ambulatory Visit: Payer: Self-pay | Admitting: Endocrinology

## 2015-01-18 ENCOUNTER — Ambulatory Visit (INDEPENDENT_AMBULATORY_CARE_PROVIDER_SITE_OTHER): Payer: Medicare Other | Admitting: Family Medicine

## 2015-01-18 ENCOUNTER — Encounter: Payer: Self-pay | Admitting: Family Medicine

## 2015-01-18 VITALS — BP 110/80 | HR 68 | Temp 98.2°F | Resp 18 | Wt 183.0 lb

## 2015-01-18 DIAGNOSIS — R600 Localized edema: Secondary | ICD-10-CM | POA: Diagnosis not present

## 2015-01-18 DIAGNOSIS — N183 Chronic kidney disease, stage 3 unspecified: Secondary | ICD-10-CM

## 2015-01-18 NOTE — Progress Notes (Signed)
Subjective:    Patient ID: Whitney [name], female    DOB: 22-Jun-1927, 81 y.o.   MRN: [id number]  HPI 09/28/14 Patient was recently seen by her endocrinologist. According to the patient he started her on spironolactone and discontinued her furosemide. She is also on Benicar HCT. Patient is unsure why the doctor did this. Her labwork recently revealed hypokalemia with a rise in her creatinine from 1.2-1.6. Patient now has bilateral tense pitting edema that is 2+ from her ankles to her knees. Patient had an echocardiogram of her heart in September of last year which revealed an ejection fraction of 60%.  She also has diabetes. At present her hemoglobin A1c is 6.5 when checked in May. She is currently on Actos as well as glipizide.  She is also taking amlodipine.  At that time, my plan was:  I believe this is likely multifactorial and related to patient taking Actos as well as amlodipine and the recent discontinuation of Lasix. I did not like the patient taking Lasix and hydrochlorothiazide together. I was unaware that she was doing this. This may have caused the rise in her creatinine. I will see the patient back on Thursday. I will have her temporarily discontinue Actos to see if this will improve her leg swelling. I will replace it with Januvia 100 mg a day. If on Thursday her leg swelling is better, I will discontinue hydrochlorothiazide I will continue the Benicar I will discontinue spironolactone and I will start the patient back on her Lasix and then recheck her renal function in 2 weeks  10/04/14 Since I last saw the patient, she has lost 2 pounds. The swelling is only slightly better in her legs. She still has +2 edema in her legs to the knee. Her legs are sore and tender from the swelling.  At that time, my plan was: Discontinue lisinopril/hydrochlorothiazide. Replace with lisinopril 20 mg a day. Resume Lasix 40 mg by mouth daily. Begin to wear knee-high compression stockings 15-20 mm, continue  to refrain from using Actos and continue to continue his Januvia 100 mg by mouth daily. Recheck kidney function and potassium in 2 weeks  10/23/14 Patient has lost 13 pounds since when I last saw her. She has been compliant wearing her compression stockings as well as taking her Lasix. Her blood pressure today is 128/58. There is no pitting edema in her legs. There is no shortness of breath. She denies any dry mouth and dizziness or lightheadedness. She does not appear dehydrated.  AT that time, my plan was: Clinically the patient has improved dramatically. There is no pitting edema. Her blood pressure stable. She has lost 13 pounds of excess fluid since discontinuing Actos, beginning the compression stockings, and starting back on the Lasix. However I need to monitor her kidney function to make sure that she is not developing prerenal azotemia. I also want to monitor her potassium. I recommended that she weigh herself every day and she continues to lose weight, we will need to stop the diaphoretic still avoid dehydration.  01/18/15 Recently, Dr. Dwyane Dee decreased lisinopril to 20 mg one half a pill a day. He also had the patient discontinue spironolactone due to the fact her potassium was elevated. Her renal function had worsened and her creatinine was greater than 1.7. She is still taking Lasix. This is managing the edema in her legs very well. Her weight is stable at 183. She has minimal pitting edema in both legs although she is also compliant with  wearing her compression stockings.   Past Medical History  Diagnosis Date  . Hypertension   . Hyperlipidemia   . Diabetes mellitus without complication ([place])   . Thyroid disease     hypothyroidism  . Anemia   . Former smoker   . Multinodular goiter   . Complication of anesthesia     slow to wake up  . Cancer ([place])     right breast  . Wears glasses   . Full dentures   . Dysrhythmia 10/15    AF  . Breast cancer ([place]) 10/2013    right upper outer    . Atrial fibrillation ([place])   . Radiation     Right Breast   Past Surgical History  Procedure Laterality Date  . Abdominal hysterectomy    . Portacath placement N/A 11/15/2013    Procedure: INSERTION PORT-A-CATH WITH ULTRA SOUND AND FLOROSCOPY;  Surgeon: [name], MD;  Location: [facility];  Service: General;  Laterality: N/A;  . Eye surgery  12/22/2009    cataracts  . Total knee arthroplasty  2002    left  . Port-a-cath removal Right 01/09/2014    Procedure: REMOVAL PORT-A-CATH;  Surgeon: [name], MD;  Location: [facility];  Service: General;  Laterality: Right;  . Breast lumpectomy with radioactive seed localization Right 01/09/2014    Procedure: RIGHT BREAST SEED LOCALIZED LUMPECTOMY ;  Surgeon: [name], MD;  Location: [facility];  Service: General;  Laterality: Right;  . Axillary lymph node dissection Right 01/09/2014    Procedure: RIGHT AXILLARY LYMPH NODE DISECTION;  Surgeon: [name], MD;  Location: [facility];  Service: General;  Laterality: Right;  . Intramedullary (im) nail intertrochanteric Left 02/24/2014    Procedure: IM ROD LEFT HIP FX;  Surgeon: Alta Corning, MD;  Location: [facility];  Service: Orthopedics;  Laterality: Left;   Current Outpatient Prescriptions on File Prior to Visit  Medication Sig Dispense Refill  . amiodarone (PACERONE) 200 MG tablet TAKE 2 TABS TWICE DAILY FOR 1 WEEK, THEN 1 TAB TWICE A DAY FOR 1 WEEK, THEN 1 TAB DAILY 30 tablet 1  . amLODipine (NORVASC) 5 MG tablet TAKE 1 TABLET (5 MG TOTAL) BY MOUTH DAILY. 30 tablet 0  . atorvastatin (LIPITOR) 10 MG tablet Take 10 mg by mouth daily.    . Cholecalciferol (VITAMIN D) 2000 UNITS CAPS Take 2,000 Units by mouth daily.    Marland Kitchen docusate sodium 100 MG CAPS Take 100 mg by mouth 2 (two) times daily. 10 capsule 0  . ELIQUIS 5 MG TABS tablet TAKE 1 TABLET TWICE A DAY 60 tablet 11  . ferrous sulfate 325 (65 FE) MG EC tablet Take 325 mg by mouth daily  with breakfast.    . furosemide (LASIX) 40 MG tablet Take 1 tablet (40 mg total) by mouth daily. 30 tablet   . GLIPIZIDE XL 2.5 MG 24 hr tablet TAKE 1 TABLET (2.5 MG TOTAL) BY MOUTH DAILY WITH BREAKFAST. 30 tablet 3  . glucose blood (ONETOUCH VERIO) test strip Use as instructed to check blood sugar once a day dx code E11.9 50 each 3  . lisinopril (PRINIVIL,ZESTRIL) 20 MG tablet Take 20 mg by mouth daily.  5  . metoprolol (LOPRESSOR) 100 MG tablet Take 100 mg by mouth 2 (two) times daily.    Glory Rosebush DELICA LANCETS 10F MISC Use to check blood sugar once a day dx code E11.9 100 each 1  . Polyethyl Glycol-Propyl Glycol (SYSTANE  OP) Place 1 drop into both eyes 3 (three) times daily.    . potassium chloride SA (K-DUR,KLOR-CON) 20 MEQ tablet Take 1 tablet (20 mEq total) by mouth once. X 5 days 5 tablet 0  . sitaGLIPtin (JANUVIA) 100 MG tablet Take 1 tablet (100 mg total) by mouth daily. 30 tablet 3  . spironolactone (ALDACTONE) 25 MG tablet Take 1 tablet (25 mg total) by mouth daily. 90 tablet 3  . tamoxifen (NOLVADEX) 20 MG tablet Take 1 tablet (20 mg total) by mouth daily. 90 tablet 3  . vitamin B-12 (CYANOCOBALAMIN) 1000 MCG tablet Take 1,000 mcg by mouth daily.     No current facility-administered medications on file prior to visit.   No Known Allergies Social History   Social History  . Marital Status: Widowed    Spouse Name: N/A  . Number of Children: 4  . Years of Education: N/A   Occupational History  . Not on file.   Social History Main Topics  . Smoking status: Former Smoker -- 1.00 packs/day for 5 years    Quit date: 11/14/1958  . Smokeless tobacco: Never Used  . Alcohol Use: No  . Drug Use: No  . Sexual Activity: No     Comment: menarche age 65, P70, first birth age 36, no HRT, menopause age 43   Other Topics Concern  . Not on file   Social History Narrative      Review of Systems  All other systems reviewed and are negative.      Objective:   Physical Exam   Neck: No JVD present.  Cardiovascular: Normal rate and normal heart sounds.   Pulmonary/Chest: Effort normal and breath sounds normal. No respiratory distress. She has no wheezes. She has no rales.  Abdominal: Soft. Bowel sounds are normal.  Musculoskeletal: She exhibits no edema.  Vitals reviewed.         Assessment & Plan:  CKD (chronic kidney disease) stage 3, GFR 30-59 ml/min - Plan: BASIC METABOLIC PANEL WITH GFR  Bilateral leg edema  Edema in the legs is well controlled. Continue to wear the compression stockings. I would also continue the furosemide as this seems to be managing her edema well. Her blood pressure is excellent. I will recheck her renal function off spironolactone and on the reduced dose of lisinopril.  If creatinine has improved and renal function is stable I will make no changes and I'll see the patient back in 3 months. If renal function has not improved with Dr. Ronnie Derby changes, I would proceed with a renal ultrasound, and SPEP and a UPEP, and possibly stopping her Lasix

## 2015-01-19 LAB — BASIC METABOLIC PANEL WITH GFR
BUN: 25 mg/dL (ref 7–25)
CO2: 26 mmol/L (ref 20–31)
Calcium: 9.2 mg/dL (ref 8.6–10.4)
Chloride: 104 mmol/L (ref 98–110)
Creat: 1.34 mg/dL — ABNORMAL HIGH (ref 0.60–0.88)
GFR, Est African American: 42 mL/min — ABNORMAL LOW (ref >=60)
GFR, Est Non African American: 36 mL/min — ABNORMAL LOW (ref >=60)
Glucose, Bld: 127 mg/dL — ABNORMAL HIGH (ref 70–99)
Potassium: 3.6 mmol/L (ref 3.5–5.3)
Sodium: 142 mmol/L (ref 135–146)

## 2015-01-20 ENCOUNTER — Other Ambulatory Visit: Payer: Self-pay | Admitting: Family Medicine

## 2015-01-30 ENCOUNTER — Other Ambulatory Visit: Payer: Self-pay | Admitting: Endocrinology

## 2015-02-13 ENCOUNTER — Other Ambulatory Visit: Payer: Self-pay | Admitting: Endocrinology

## 2015-02-20 ENCOUNTER — Other Ambulatory Visit: Payer: Self-pay | Admitting: Family Medicine

## 2015-02-20 ENCOUNTER — Other Ambulatory Visit: Payer: Self-pay | Admitting: Endocrinology

## 2015-02-26 NOTE — Progress Notes (Signed)
This encounter was created in error - please disregard.

## 2015-03-06 ENCOUNTER — Other Ambulatory Visit: Payer: Self-pay | Admitting: Family Medicine

## 2015-03-19 ENCOUNTER — Telehealth: Payer: Self-pay | Admitting: Endocrinology

## 2015-03-19 ENCOUNTER — Encounter: Payer: Self-pay | Admitting: Endocrinology

## 2015-03-19 ENCOUNTER — Other Ambulatory Visit: Payer: Self-pay | Admitting: *Deleted

## 2015-03-19 ENCOUNTER — Ambulatory Visit (INDEPENDENT_AMBULATORY_CARE_PROVIDER_SITE_OTHER): Payer: Medicare Other | Admitting: Endocrinology

## 2015-03-19 VITALS — BP 136/80 | HR 53 | Temp 98.2°F | Resp 14 | Ht 65.0 in | Wt 187.6 lb

## 2015-03-19 DIAGNOSIS — E042 Nontoxic multinodular goiter: Secondary | ICD-10-CM | POA: Diagnosis not present

## 2015-03-19 DIAGNOSIS — E119 Type 2 diabetes mellitus without complications: Secondary | ICD-10-CM

## 2015-03-19 DIAGNOSIS — N289 Disorder of kidney and ureter, unspecified: Secondary | ICD-10-CM | POA: Diagnosis not present

## 2015-03-19 LAB — BASIC METABOLIC PANEL
BUN: 24 mg/dL — ABNORMAL HIGH (ref 6–23)
CO2: 32 mEq/L (ref 19–32)
Calcium: 9.4 mg/dL (ref 8.4–10.5)
Chloride: 104 mEq/L (ref 96–112)
Creatinine, Ser: 1.24 mg/dL — ABNORMAL HIGH (ref 0.40–1.20)
GFR: 52.86 mL/min — ABNORMAL LOW (ref >60.00)
Glucose, Bld: 112 mg/dL — ABNORMAL HIGH (ref 70–99)
Potassium: 3.5 mEq/L (ref 3.5–5.1)
Sodium: 142 mEq/L (ref 135–145)

## 2015-03-19 LAB — LIPID PANEL
Cholesterol: 164 mg/dL (ref 0–200)
HDL: 65.7 mg/dL (ref >39.00)
LDL Cholesterol: 88 mg/dL (ref 0–99)
NonHDL: 98.7
Total CHOL/HDL Ratio: 3
Triglycerides: 54 mg/dL (ref 0.0–149.0)
VLDL: 10.8 mg/dL (ref 0.0–40.0)

## 2015-03-19 LAB — TSH: TSH: 0.62 u[IU]/mL (ref 0.35–4.50)

## 2015-03-19 LAB — POCT GLYCOSYLATED HEMOGLOBIN (HGB A1C): Hemoglobin A1C: 5

## 2015-03-19 LAB — T4, FREE: Free T4: 1.26 ng/dL (ref 0.60–1.60)

## 2015-03-19 MED ORDER — POTASSIUM CHLORIDE CRYS ER 20 MEQ PO TBCR: EXTENDED_RELEASE_TABLET | ORAL | Status: DC

## 2015-03-19 NOTE — Telephone Encounter (Signed)
Now grand daughter is saying no she's not taking potassium.  Please advise.

## 2015-03-19 NOTE — Telephone Encounter (Signed)
Yes she is taking potassium

## 2015-03-19 NOTE — Telephone Encounter (Signed)
20 mEq daily, as long as she is taking furosemide fluid pill

## 2015-03-19 NOTE — Telephone Encounter (Signed)
Whitney [name], is she taking potassium?

## 2015-03-19 NOTE — Telephone Encounter (Signed)
Noted, rx sent, patient is aware. 

## 2015-03-19 NOTE — Telephone Encounter (Signed)
Need to confirm, her potassium  level is low normal.  If she is taking 20 mEq daily will need to take this twice a day.  Last prescription was for 5 tablets ?

## 2015-03-19 NOTE — Progress Notes (Signed)
Patient ID: Whitney [name], female   DOB: 1927/05/23, 82 y.o.   MRN: [id number]   Reason for Appointment: Diabetes and other problems  History of Present Illness    Type 2 DIABETES MELITUS, date of diagnosis:  1983     Previous history: She had been taking metformin and Actos for several years.  She usually has had excellent control with upper normal A1c  Recent history:   Oral hypoglycemic drugs: glipizide ER 2.5 mg daily    She was previously on  Metformin and Actos; because of edema she was told to stop Actos She was started on glipizide ER 2.5 mg daily instead, subsequently metformin was stopped because of renal dysfunction  She did not bring her monitor for download and does not remember her readings A1c is excellent today She does not report any hypoglycemic symptoms at home Has gained weight probably from the holidays, previously may have been mildly volume depleted also       Monitors blood glucose: ?  Once a day .    Glucometer: One Touch.          Blood Glucose readings from recall: Last reading was 135  Physical activity: exercise: Only walking around the house       Diet: She stays away from fast food, sometimes will have some light lemonade                Wt Readings from Last 3 Encounters:  03/19/15 187 lb 9.6 oz (85.095 kg)  01/18/15 183 lb (83.008 kg)  12/21/14 180 lb 9.6 oz (81.92 kg)    LABS:  Lab Results  Component Value Date   HGBA1C 5 03/19/2015   HGBA1C 5.5 12/17/2014   HGBA1C 6.5* 07/27/2014   Lab Results  Component Value Date   MICROALBUR 2.7* 09/19/2014   [place] 91 07/27/2014   CREATININE 1.34* 01/18/2015     OTHER problems including renal dysfunction are discussed in review of systems      Medication List       This list is accurate as of: 03/19/15 10:01 AM.  Always use your most recent med list.               amiodarone 200 MG tablet  Commonly known as:  PACERONE  TAKE 2 TABS TWICE DAILY FOR 1 WEEK, THEN 1  TAB TWICE A DAY FOR 1 WEEK, THEN 1 TAB DAILY     amiodarone 200 MG tablet  Commonly known as:  PACERONE  TAKE 1 TABLET (200 MG TOTAL) BY MOUTH DAILY.     amLODipine 5 MG tablet  Commonly known as:  NORVASC  TAKE 1 TABLET (5 MG TOTAL) BY MOUTH DAILY.     atorvastatin 10 MG tablet  Commonly known as:  LIPITOR  Take 10 mg by mouth daily.     DSS 100 MG Caps  Take 100 mg by mouth 2 (two) times daily.     ELIQUIS 5 MG Tabs tablet  Generic drug:  apixaban  TAKE 1 TABLET TWICE A DAY     ferrous sulfate 325 (65 FE) MG EC tablet  Take 325 mg by mouth daily with breakfast.     furosemide 40 MG tablet  Commonly known as:  LASIX  TAKE 1 TABLET BY MOUTH EVERY DAY     GLIPIZIDE XL 2.5 MG 24 hr tablet  Generic drug:  glipiZIDE  TAKE 1 TABLET (2.5 MG TOTAL) BY MOUTH DAILY WITH BREAKFAST.     lisinopril  20 MG tablet  Commonly known as:  PRINIVIL,ZESTRIL  TAKE 1 TABLET BY MOUTH EVERY DAY     metoprolol 100 MG tablet  Commonly known as:  LOPRESSOR  Take 100 mg by mouth 2 (two) times daily.     ONETOUCH DELICA LANCETS 99991111 Misc  Use to check blood sugar once a day dx code E11.9     ONETOUCH VERIO test strip  Generic drug:  glucose blood  CHECK BLOOD SUGAR EVERY DAY AS DIRECTED     potassium chloride SA 20 MEQ tablet  Commonly known as:  K-DUR,KLOR-CON  Take 1 tablet (20 mEq total) by mouth once. X 5 days     SYSTANE OP  Place 1 drop into both eyes 3 (three) times daily.     tamoxifen 20 MG tablet  Commonly known as:  NOLVADEX  Take 1 tablet (20 mg total) by mouth daily.     vitamin B-12 1000 MCG tablet  Commonly known as:  CYANOCOBALAMIN  Take 1,000 mcg by mouth daily.     Vitamin D 2000 units Caps  Take 2,000 Units by mouth daily.        Allergies: No Known Allergies  Past Medical History  Diagnosis Date  . Hypertension   . Hyperlipidemia   . Diabetes mellitus without complication ([place])   . Thyroid disease     hypothyroidism  . Anemia   . Former smoker   .  Multinodular goiter   . Complication of anesthesia     slow to wake up  . Cancer ([place])     right breast  . Wears glasses   . Full dentures   . Dysrhythmia 10/15    AF  . Breast cancer ([place]) 10/2013    right upper outer  . Atrial fibrillation ([place])   . Radiation     Right Breast    Past Surgical History  Procedure Laterality Date  . Abdominal hysterectomy    . Portacath placement N/A 11/15/2013    Procedure: INSERTION PORT-A-CATH WITH ULTRA SOUND AND FLOROSCOPY;  Surgeon: [name], MD;  Location: [facility];  Service: General;  Laterality: N/A;  . Eye surgery  12/22/2009    cataracts  . Total knee arthroplasty  2002    left  . Port-a-cath removal Right 01/09/2014    Procedure: REMOVAL PORT-A-CATH;  Surgeon: [name], MD;  Location: [facility];  Service: General;  Laterality: Right;  . Breast lumpectomy with radioactive seed localization Right 01/09/2014    Procedure: RIGHT BREAST SEED LOCALIZED LUMPECTOMY ;  Surgeon: [name], MD;  Location: [facility];  Service: General;  Laterality: Right;  . Axillary lymph node dissection Right 01/09/2014    Procedure: RIGHT AXILLARY LYMPH NODE DISECTION;  Surgeon: [name], MD;  Location: [facility];  Service: General;  Laterality: Right;  . Intramedullary (im) nail intertrochanteric Left 02/24/2014    Procedure: IM ROD LEFT HIP FX;  Surgeon: Alta Corning, MD;  Location: [facility];  Service: Orthopedics;  Laterality: Left;    No family history on file.  Social History:  reports that she quit smoking about 56 years ago. She has never used smokeless tobacco. She reports that she does not drink alcohol or use illicit drugs.  Review of Systems:  Hypertension: She usually has had good control, now taking lisinopril 10 mg along with amlodipine 5 mg daily  EDEMA She is taking Lasix 40 for edema and this appears to be effective Potassium has been  low in the past  Renal function:  Creatinine has improved with reducing her lisinopril and stopping Aldactone Needs follow-up  Lab Results  Component Value Date   CREATININE 1.34* 01/18/2015   BUN 25 01/18/2015   NA 142 01/18/2015   K 3.6 01/18/2015   CL 104 01/18/2015   CO2 26 01/18/2015    Lipids: Has been on long-term Lipitor with good control    Lab Results  Component Value Date   CHOL 179 07/27/2014   HDL 74 07/27/2014   LDLCALC 91 07/27/2014   TRIG 68 07/27/2014   CHOLHDL 2.4 07/27/2014    She has had vitamin D deficiency, her level was 35 previously with 2000 units vitamin D 3  HYPOTHYROIDISM: She has had a multinodular goiter and not clear if she is truly hypothyroid. Over the last couple of years her dose has been tapered down because of low normal TSH levels and has not had any increase in the size of her goiter.  TSH levels have been low normal and she was told to stop taking levothyroxine in 5/16 No difficulty swallowing No recent labs available    Lab Results  Component Value Date   FREET4 1.42 11/08/2013   FREET4 1.37 11/24/2012   TSH 0.249* 07/27/2014   TSH 0.549 01/30/2014   TSH 0.551 12/08/2013   Last foot exam in 7/16  ADRENAL mass: She was evaluated by a PET scan after  diagnosis of breast cancer She was found to have a 3.7 cm hypermetabolic left adrenal mass  On a previous CT scan in 2011 she also had a 3.8 cm adrenal mass Cortisol level is normal  Examination:   BP 136/80 mmHg  Pulse 53  Temp(Src) 98.2 F (36.8 C)  Resp 14  Ht 5\' 5"  (1.651 m)  Wt 187 lb 9.6 oz (85.095 kg)  BMI 31.22 kg/m2  SpO2 96%  Body mass index is 31.22 kg/(m^2).   No significant pedal edema   ASSESSMENT/ PLAN:  Diabetes type 2   Blood glucose control is fairly good with glipizide ER 2.5 mg alone A1c is normal at 5 No hypoglycemia with low-dose glipizide She will call back to report her blood sugar readings at home Discussed timing and targets of blood sugars Encouraged her to be more  active also for weight loss  RENAL insufficiency:  Creatinine will be rechecked  Hypertension: Blood pressure is  normal with current regimen including amlodipine and lisinopril 10 mg  Multinodular goiter: stable clinically, needs follow-up TSH  Her daughter will also call back to reconsult medications as patient does not remember what she is taking  Kaymarie Wynn 03/19/2015, 10:01 AM

## 2015-03-19 NOTE — Telephone Encounter (Signed)
Patients grand Daughter called and would like to speak with Suanne Marker regarding her meter   Please advise    Thank you

## 2015-03-19 NOTE — Telephone Encounter (Signed)
Please call pt back.

## 2015-03-19 NOTE — Telephone Encounter (Signed)
Granddaughter called with her grandma's sugar readings  12/28 6:26 pm- 141 12/29 7:14 pm-126 12/30 6:33 pm-149 12/31 8:29 pm 207 01/01 6:00 pm 154 01/02 6:15 pm 152  She also read off her prescriptions and they were everything we have listed.

## 2015-03-19 NOTE — Patient Instructions (Signed)
Check blood sugars on waking up 1-2  times a week Also check blood sugars about 2 hours after a meal and do this after different meals by rotation  Recommended blood sugar levels on waking up is 90-130 and about 2 hours after meal is 130-160  Please bring your blood sugar monitor to each visit, thank you  Bring all Meds on each visit

## 2015-03-21 DIAGNOSIS — L720 Epidermal cyst: Secondary | ICD-10-CM | POA: Diagnosis not present

## 2015-03-22 ENCOUNTER — Ambulatory Visit: Payer: Medicare Other | Admitting: Endocrinology

## 2015-04-17 NOTE — Assessment & Plan Note (Signed)
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 ER positive PR positive HER-2 negative Ki-67 11%. Patient was hospitalized for left hip fracture 02/25/2014, completed adjuvant radiation therapy 06/29/14, Started Tamoxifen 07/20/14  Tamoxifen Toxicities:no toxicities tamoxifen. Denies any hot flashes or myalgias.  Breast Cancer Surveillance: 1. Breast exam 04/18/15 2.Mammogram 10/31/14: No breast cancer, post-op changes density B  RTC in 6 months

## 2015-04-18 ENCOUNTER — Encounter: Payer: Self-pay | Admitting: Hematology and Oncology

## 2015-04-18 ENCOUNTER — Ambulatory Visit (HOSPITAL_BASED_OUTPATIENT_CLINIC_OR_DEPARTMENT_OTHER): Payer: Medicare Other | Admitting: Hematology and Oncology

## 2015-04-18 ENCOUNTER — Telehealth: Payer: Self-pay | Admitting: Hematology and Oncology

## 2015-04-18 VITALS — BP 162/85 | HR 52 | Temp 98.2°F | Resp 18 | Ht 65.0 in | Wt 190.2 lb

## 2015-04-18 DIAGNOSIS — C50411 Malignant neoplasm of upper-outer quadrant of right female breast: Secondary | ICD-10-CM

## 2015-04-18 NOTE — Telephone Encounter (Signed)
Appointments made and avs printed for patient °

## 2015-04-18 NOTE — Progress Notes (Signed)
Patient Care Team: [name], MD as PCP - General (Family Medicine) Nicholas Lose, MD as Consulting Physician (Hematology and Oncology) [name], MD as Consulting Physician (General Surgery) [name], MD as Consulting Physician (Radiation Oncology)  DIAGNOSIS: Breast cancer of upper-outer quadrant of right female breast [facility])   Staging form: Breast, AJCC 7th Edition     Clinical: Stage IIB (T2, N1, cM0) - Signed by [name], MD on 11/01/2013     Pathologic: Stage IIIA (T1c, N2a, cM0) - Unsigned   SUMMARY OF ONCOLOGIC HISTORY:   Breast cancer of upper-outer quadrant of right female breast ([place])   10/20/2013 Mammogram Ultrasound and mammogram showed 2.1 x 2.1 x 1.9 cm right breast mass   10/20/2013 Initial Diagnosis Breast cancer of upper-outer quadrant of right female breast. Invasive ductal cancer with lymphovascular invasion one lymph node biopsy that was positive for cancer grade 1; Her 2 Neg Ratio 0.96, ER100% PR 8% positive Ki 67: 11%;   10/31/2013 Breast MRI Right breast upper outer quadrant: 3.1 x 1.9 x 2.2 cm: Right axillary lymph node 1.1 x 0.9 x 0.6 cm   11/16/2013 - 12/01/2013 Chemotherapy Neoadjuvant dose dense Doxorubicin and Cyclophosphamide given on day 1 of a 14 day cycle with Neulasta given on day 2 for granulocyte support   01/09/2014 Surgery Right breast lumpectomy: Invasive ductal carcinoma, grade 2, 1.8 cm with DCIS intermediate grade, lymphovascular invasion diffusely, 5 of 13 lymph nodes positive with extracapsular extension, ER positive, PR 80%, HER-2 negative ratio 0.96, Ki-67 11%   02/25/2014 - 02/28/2014 Hospital Admission Left hip fracture as a result of a fall at home   05/15/2014 - 06/29/2014 Radiation Therapy Adjuvant radiation therapy with Dr. Pablo Ledger   07/20/2014 -  Anti-estrogen oral therapy Tamoxifen 20 mg daily    CHIEF COMPLIANT:  Follow-up of tamoxifen therapy  INTERVAL HISTORY: Whitney [name] is a 82 year old with above-mentioned  history of right breast cancer who received neoadjuvant chemotherapy followed by lumpectomy. She had 5 positive lymph nodes with extracapsular extension. Subsequently she underwent radiation therapy and is currently on adjuvant tamoxifen therapy. She is tolerating tamoxifen beautifully without any major problems or concerns.she is not doing much exercise. She complains of lower extremity swelling.  REVIEW OF SYSTEMS:   Constitutional: Denies fevers, chills or abnormal weight loss Eyes: Denies blurriness of vision Ears, nose, mouth, throat, and face: Denies mucositis or sore throat Respiratory: Denies cough, dyspnea or wheezes Cardiovascular: Denies palpitation, chest discomfort Gastrointestinal:  Denies nausea, heartburn or change in bowel habits Skin: Denies abnormal skin rashes Lymphatics: Denies new lymphadenopathy or easy bruising Neurological:Denies numbness, tingling or new weaknesses Behavioral/Psych: Mood is stable, no new changes  Extremities: No lower extremity edema Breast:  denies any pain  in either breasts All other systems were reviewed with the patient and are negative.  I have reviewed the past medical history, past surgical history, social history and family history with the patient and they are unchanged from previous note.  ALLERGIES:  has No Known Allergies.  MEDICATIONS:  Current Outpatient Prescriptions  Medication Sig Dispense Refill  . amiodarone (PACERONE) 200 MG tablet TAKE 2 TABS TWICE DAILY FOR 1 WEEK, THEN 1 TAB TWICE A DAY FOR 1 WEEK, THEN 1 TAB DAILY 30 tablet 1  . amiodarone (PACERONE) 200 MG tablet TAKE 1 TABLET (200 MG TOTAL) BY MOUTH DAILY. 30 tablet 5  . amLODipine (NORVASC) 5 MG tablet TAKE 1 TABLET (5 MG TOTAL) BY MOUTH DAILY. 30 tablet 3  .  atorvastatin (LIPITOR) 10 MG tablet Take 10 mg by mouth daily.    . Cholecalciferol (VITAMIN D) 2000 UNITS CAPS Take 2,000 Units by mouth daily.    Marland Kitchen docusate sodium 100 MG CAPS Take 100 mg by mouth 2 (two)  times daily. 10 capsule 0  . ELIQUIS 5 MG TABS tablet TAKE 1 TABLET TWICE A DAY 60 tablet 11  . ferrous sulfate 325 (65 FE) MG EC tablet Take 325 mg by mouth daily with breakfast.    . furosemide (LASIX) 40 MG tablet TAKE 1 TABLET BY MOUTH EVERY DAY 30 tablet 5  . GLIPIZIDE XL 2.5 MG 24 hr tablet TAKE 1 TABLET (2.5 MG TOTAL) BY MOUTH DAILY WITH BREAKFAST. 30 tablet 3  . JANUVIA 100 MG tablet Take 100 mg by mouth daily.  3  . lisinopril (PRINIVIL,ZESTRIL) 20 MG tablet TAKE 1 TABLET BY MOUTH EVERY DAY 30 tablet 11  . metoprolol (LOPRESSOR) 100 MG tablet Take 100 mg by mouth 2 (two) times daily.    Glory Rosebush DELICA LANCETS 76A MISC Use to check blood sugar once a day dx code E11.9 100 each 1  . ONETOUCH VERIO test strip CHECK BLOOD SUGAR EVERY DAY AS DIRECTED 50 each 3  . Polyethyl Glycol-Propyl Glycol (SYSTANE OP) Place 1 drop into both eyes 3 (three) times daily.    . potassium chloride SA (K-DUR,KLOR-CON) 20 MEQ tablet Take 1 tablet daily 30 tablet 3  . tamoxifen (NOLVADEX) 20 MG tablet Take 1 tablet (20 mg total) by mouth daily. 90 tablet 3  . vitamin B-12 (CYANOCOBALAMIN) 1000 MCG tablet Take 1,000 mcg by mouth daily.     No current facility-administered medications for this visit.    PHYSICAL EXAMINATION: ECOG PERFORMANCE STATUS: 1 - Symptomatic but completely ambulatory  Filed Vitals:   04/18/15 1003  BP: 162/85  Pulse: 52  Temp: 98.2 F (36.8 C)  Resp: 18   Filed Weights   04/18/15 1003  Weight: 190 lb 3.2 oz (86.274 kg)    GENERAL:alert, no distress and comfortable SKIN: skin color, texture, turgor are normal, no rashes or significant lesions EYES: normal, Conjunctiva are pink and non-injected, sclera clear OROPHARYNX:no exudate, no erythema and lips, buccal mucosa, and tongue normal  NECK: supple, thyroid normal size, non-tender, without nodularity LYMPH:  no palpable lymphadenopathy in the cervical, axillary or inguinal LUNGS: clear to auscultation and percussion with  normal breathing effort HEART: regular rate & rhythm and no murmurs and no lower extremity edema ABDOMEN:abdomen soft, non-tender and normal bowel sounds MUSCULOSKELETAL:no cyanosis of digits and no clubbing  NEURO: alert & oriented x 3 with fluent speech, no focal motor/sensory deficits EXTREMITIES: No lower extremity edema BREAST: the right breast reveals evidence of prior breast surgery and radiation with the scar tissue feeling nodular. There is also right breast lymphedema. No palpable or enlarged lymph nodes in axilla. Left breast completely normal exam. (exam performed in the presence of a chaperone)  LABORATORY DATA:  I have reviewed the data as listed   Chemistry      Component Value Date/Time   NA 142 03/19/2015 0949   NA 141 12/22/2013 1000   K 3.5 03/19/2015 0949   K 3.1* 12/22/2013 1000   CL 104 03/19/2015 0949   CO2 32 03/19/2015 0949   CO2 30* 12/22/2013 1000   BUN 24* 03/19/2015 0949   BUN 26.0 12/22/2013 1000   CREATININE 1.24* 03/19/2015 0949   CREATININE 1.34* 01/18/2015 1611   CREATININE 1.3* 12/22/2013 1000  Component Value Date/Time   CALCIUM 9.4 03/19/2015 0949   CALCIUM 10.0 12/22/2013 1000   ALKPHOS 59 12/17/2014 1001   ALKPHOS 66 12/22/2013 1000   AST 32 12/17/2014 1001   AST 11 12/22/2013 1000   ALT 37* 12/17/2014 1001   ALT 8 12/22/2013 1000   BILITOT 0.5 12/17/2014 1001   BILITOT 0.29 12/22/2013 1000       Lab Results  Component Value Date   WBC 4.3 07/27/2014   HGB 10.0* 07/27/2014   HCT 31.0* 07/27/2014   MCV 87.1 07/27/2014   PLT 203 07/27/2014   NEUTROABS 3.3 07/27/2014    ASSESSMENT & PLAN:  Breast cancer of upper-outer quadrant of right female breast 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 ER positive PR positive HER-2 negative Ki-67 11%. Patient was hospitalized for left hip fracture 02/25/2014,  completed adjuvant radiation therapy 06/29/14, Started Tamoxifen 07/20/14  Tamoxifen Toxicities:no toxicities to tamoxifen. Denies any hot flashes or myalgias.  Breast Cancer Surveillance: 1. Breast exam 04/18/15: no lumps or nodules or any concerns for breast cancer. 2.Mammogram 10/31/14: No breast cancer, post-op changes density B  Leg edema: I discussed with her the importance of exercise in decreasing the leg swelling. Mild chronic kidney disease: January 2017 she had blood work. This is being monitored by her primary care physician.  RTC in 6 months For surveillance and follow-up.   No orders of the defined types were placed in this encounter.   The patient has a good understanding of the overall plan. she agrees with it. she will call with any problems that may develop before the next visit here.   [name], MD 04/18/2015

## 2015-04-23 ENCOUNTER — Encounter: Payer: Self-pay | Admitting: Family Medicine

## 2015-04-23 ENCOUNTER — Ambulatory Visit (INDEPENDENT_AMBULATORY_CARE_PROVIDER_SITE_OTHER): Payer: Medicare Other | Admitting: Family Medicine

## 2015-04-23 VITALS — BP 160/70 | HR 62 | Temp 98.1°F | Resp 18 | Wt 188.0 lb

## 2015-04-23 DIAGNOSIS — R600 Localized edema: Secondary | ICD-10-CM

## 2015-04-23 DIAGNOSIS — E785 Hyperlipidemia, unspecified: Secondary | ICD-10-CM

## 2015-04-23 DIAGNOSIS — N183 Chronic kidney disease, stage 3 unspecified: Secondary | ICD-10-CM

## 2015-04-23 DIAGNOSIS — I10 Essential (primary) hypertension: Secondary | ICD-10-CM | POA: Diagnosis not present

## 2015-04-23 DIAGNOSIS — E119 Type 2 diabetes mellitus without complications: Secondary | ICD-10-CM

## 2015-04-23 NOTE — Progress Notes (Signed)
Subjective:    Patient ID: Whitney [name], female    DOB: [birth year], 82 y.o.   MRN: [id number]  HPI 09/28/14 Patient was recently seen by her endocrinologist. According to the patient he started her on spironolactone and discontinued her furosemide. She is also on Benicar HCT. Patient is unsure why the doctor did this. Her labwork recently revealed hypokalemia with a rise in her creatinine from 1.2-1.6. Patient now has bilateral tense pitting edema that is 2+ from her ankles to her knees. Patient had an echocardiogram of her heart in September of last year which revealed an ejection fraction of 60%.  She also has diabetes. At present her hemoglobin A1c is 6.5 when checked in May. She is currently on Actos as well as glipizide.  She is also taking amlodipine.  At that time, my plan was:  I believe this is likely multifactorial and related to patient taking Actos as well as amlodipine and the recent discontinuation of Lasix. I did not like the patient taking Lasix and hydrochlorothiazide together. I was unaware that she was doing this. This may have caused the rise in her creatinine. I will see the patient back on Thursday. I will have her temporarily discontinue Actos to see if this will improve her leg swelling. I will replace it with Januvia 100 mg a day. If on Thursday her leg swelling is better, I will discontinue hydrochlorothiazide I will continue the Benicar I will discontinue spironolactone and I will start the patient back on her Lasix and then recheck her renal function in 2 weeks  10/04/14 Since I last saw the patient, she has lost 2 pounds. The swelling is only slightly better in her legs. She still has +2 edema in her legs to the knee. Her legs are sore and tender from the swelling.  At that time, my plan was: Discontinue lisinopril/hydrochlorothiazide. Replace with lisinopril 20 mg a day. Resume Lasix 40 mg by mouth daily. Begin to wear knee-high compression stockings 15-20 mm, continue  to refrain from using Actos and continue to continue his Januvia 100 mg by mouth daily. Recheck kidney function and potassium in 2 weeks  10/23/14 Patient has lost 13 pounds since when I last saw her. She has been compliant wearing her compression stockings as well as taking her Lasix. Her blood pressure today is 128/58. There is no pitting edema in her legs. There is no shortness of breath. She denies any dry mouth and dizziness or lightheadedness. She does not appear dehydrated.  AT that time, my plan was: Clinically the patient has improved dramatically. There is no pitting edema. Her blood pressure stable. She has lost 13 pounds of excess fluid since discontinuing Actos, beginning the compression stockings, and starting back on the Lasix. However I need to monitor her kidney function to make sure that she is not developing prerenal azotemia. I also want to monitor her potassium. I recommended that she weigh herself every day and she continues to lose weight, we will need to stop the diaphoretic still avoid dehydration.  01/18/15 Recently, Dr. Dwyane Dee decreased lisinopril to 20 mg one half a pill a day. He also had the patient discontinue spironolactone due to the fact her potassium was elevated. Her renal function had worsened and her creatinine was greater than 1.7. She is still taking Lasix. This is managing the edema in her legs very well. Her weight is stable at 183. She has minimal pitting edema in both legs although she is also compliant with  wearing her compression stockings.  At that time, my plan was: Edema in the legs is well controlled. Continue to wear the compression stockings. I would also continue the furosemide as this seems to be managing her edema well. Her blood pressure is excellent. I will recheck her renal function off spironolactone and on the reduced dose of lisinopril.  If creatinine has improved and renal function is stable I will make no changes and I'll see the patient back in 3  months. If renal function has not improved with Dr. Ronnie Derby changes, I would proceed with a renal ultrasound, and SPEP and a UPEP, and possibly stopping her Lasix  04/23/15 Since I last saw the patient, she gained 5 pounds. Her blood pressure is also increased significantly to 160/70. She admits to eating more sodium are typically lunch meat, bread, and sandwiches. She is taking Lasix 40 mg a day along with potassium 20 mEq a day. Her kidney function was checked last month and was found to be stable and actually slightly better than previous. She denies any chest pain shortness of breath or dyspnea on exertion. She denies any myalgias or right upper quadrant pain. She denies any hypoglycemia. She recently saw her endocrinologist who according to the patient checked her hemoglobin A1c although she does not remember the value. She denies any polyuria, polydipsia, or blurred vision. She denies any syncope or presyncope or tachycardia arrhythmia secondary to her. History of paroxysmal atrial fibrillation. She is chronically anticoagulated for this and denies any bleeding or bruising.   Past Medical History  Diagnosis Date  . Hypertension   . Hyperlipidemia   . Diabetes mellitus without complication ([place])   . Thyroid disease     hypothyroidism  . Anemia   . Former smoker   . Multinodular goiter   . Complication of anesthesia     slow to wake up  . Cancer ([place])     right breast  . Wears glasses   . Full dentures   . Dysrhythmia 10/15    AF  . Breast cancer ([place]) 10/2013    right upper outer  . Atrial fibrillation ([place])   . Radiation     Right Breast   Past Surgical History  Procedure Laterality Date  . Abdominal hysterectomy    . Portacath placement N/A 11/15/2013    Procedure: INSERTION PORT-A-CATH WITH ULTRA SOUND AND FLOROSCOPY;  Surgeon: [name], MD;  Location: [facility];  Service: General;  Laterality: N/A;  . Eye surgery  12/22/2009    cataracts  . Total knee arthroplasty  2002     left  . Port-a-cath removal Right 01/09/2014    Procedure: REMOVAL PORT-A-CATH;  Surgeon: [name], MD;  Location: [facility];  Service: General;  Laterality: Right;  . Breast lumpectomy with radioactive seed localization Right 01/09/2014    Procedure: RIGHT BREAST SEED LOCALIZED LUMPECTOMY ;  Surgeon: [name], MD;  Location: [facility];  Service: General;  Laterality: Right;  . Axillary lymph node dissection Right 01/09/2014    Procedure: RIGHT AXILLARY LYMPH NODE DISECTION;  Surgeon: [name], MD;  Location: [facility];  Service: General;  Laterality: Right;  . Intramedullary (im) nail intertrochanteric Left 02/24/2014    Procedure: IM ROD LEFT HIP FX;  Surgeon: Alta Corning, MD;  Location: [facility];  Service: Orthopedics;  Laterality: Left;   Current Outpatient Prescriptions on File Prior to Visit  Medication Sig Dispense Refill  . amiodarone (PACERONE) 200 MG  tablet TAKE 1 TABLET (200 MG TOTAL) BY MOUTH DAILY. 30 tablet 5  . amLODipine (NORVASC) 5 MG tablet TAKE 1 TABLET (5 MG TOTAL) BY MOUTH DAILY. 30 tablet 3  . atorvastatin (LIPITOR) 10 MG tablet Take 10 mg by mouth daily.    . Cholecalciferol (VITAMIN D) 2000 UNITS CAPS Take 2,000 Units by mouth daily.    Marland Kitchen docusate sodium 100 MG CAPS Take 100 mg by mouth 2 (two) times daily. 10 capsule 0  . ELIQUIS 5 MG TABS tablet TAKE 1 TABLET TWICE A DAY 60 tablet 11  . ferrous sulfate 325 (65 FE) MG EC tablet Take 325 mg by mouth daily with breakfast.    . furosemide (LASIX) 40 MG tablet TAKE 1 TABLET BY MOUTH EVERY DAY 30 tablet 5  . GLIPIZIDE XL 2.5 MG 24 hr tablet TAKE 1 TABLET (2.5 MG TOTAL) BY MOUTH DAILY WITH BREAKFAST. 30 tablet 3  . JANUVIA 100 MG tablet Take 100 mg by mouth daily.  3  . lisinopril (PRINIVIL,ZESTRIL) 20 MG tablet TAKE 1 TABLET BY MOUTH EVERY DAY 30 tablet 11  . metoprolol (LOPRESSOR) 100 MG tablet Take 100 mg by mouth 2 (two) times daily.    Glory Rosebush DELICA  LANCETS 99991111 MISC Use to check blood sugar once a day dx code E11.9 100 each 1  . ONETOUCH VERIO test strip CHECK BLOOD SUGAR EVERY DAY AS DIRECTED 50 each 3  . Polyethyl Glycol-Propyl Glycol (SYSTANE OP) Place 1 drop into both eyes 3 (three) times daily.    . potassium chloride SA (K-DUR,KLOR-CON) 20 MEQ tablet Take 1 tablet daily 30 tablet 3  . tamoxifen (NOLVADEX) 20 MG tablet Take 1 tablet (20 mg total) by mouth daily. 90 tablet 3  . vitamin B-12 (CYANOCOBALAMIN) 1000 MCG tablet Take 1,000 mcg by mouth daily.     No current facility-administered medications on file prior to visit.   No Known Allergies Social History   Social History  . Marital Status: Widowed    Spouse Name: N/A  . Number of Children: 4  . Years of Education: N/A   Occupational History  . Not on file.   Social History Main Topics  . Smoking status: Former Smoker -- 1.00 packs/day for 5 years    Quit date: 11/14/1958  . Smokeless tobacco: Never Used  . Alcohol Use: No  . Drug Use: No  . Sexual Activity: No     Comment: menarche age 78, P59, first birth age 28, no HRT, menopause age 40   Other Topics Concern  . Not on file   Social History Narrative      Review of Systems  All other systems reviewed and are negative.      Objective:   Physical Exam  Neck: No JVD present.  Cardiovascular: Normal rate and normal heart sounds.   Pulmonary/Chest: Effort normal and breath sounds normal. No respiratory distress. She has no wheezes. She has no rales.  Abdominal: Soft. Bowel sounds are normal.  Musculoskeletal: She exhibits no edema.  Vitals reviewed.  +2 pitting edema in the legs distal to the knee       Assessment & Plan:  CKD (chronic kidney disease) stage 3, GFR 30-59 ml/min  Bilateral leg edema - Plan: COMPLETE METABOLIC PANEL WITH GFR, CBC with Differential/Platelet  Controlled type 2 diabetes mellitus without complication, without long-term current use of insulin (HCC)  HLD  (hyperlipidemia)  Benign essential HTN  pressures elevated and the patient appears to be retaining fluid.  I have recommended increasing Lasix to 40 mg by mouth twice a day. I will have the patient return in one week so that I can recheck her blood pressure and recheck her kidney function along with her potassium after diuresis. Consider at that time switching amlodipine to a different blood pressure medication to avoid any peripheral edema secondary to the amlodipine. I recommended the patient return fasting so that I can check a fasting lipid panel. Her diabetes is being managed by her endocrinologist. She is appropriately anticoagulated and her heart rate is well controlled. I will check a CBC to monitor for anemia on chronic anticoagulation.

## 2015-04-24 ENCOUNTER — Other Ambulatory Visit: Payer: Self-pay | Admitting: *Deleted

## 2015-04-24 LAB — CBC WITH DIFFERENTIAL/PLATELET
Basophils Absolute: 0 10*3/uL (ref 0.0–0.1)
Basophils Relative: 0 % (ref 0–1)
Eosinophils Absolute: 0.1 10*3/uL (ref 0.0–0.7)
Eosinophils Relative: 1 % (ref 0–5)
HCT: 36.2 % (ref 36.0–46.0)
Hemoglobin: 11.7 g/dL — ABNORMAL LOW (ref 12.0–15.0)
Lymphocytes Relative: 12 % (ref 12–46)
Lymphs Abs: 0.6 10*3/uL — ABNORMAL LOW (ref 0.7–4.0)
MCH: 29.2 pg (ref 26.0–34.0)
MCHC: 32.3 g/dL (ref 30.0–36.0)
MCV: 90.3 fL (ref 78.0–100.0)
MPV: 10.7 fL (ref 8.6–12.4)
Monocytes Absolute: 0.5 10*3/uL (ref 0.1–1.0)
Monocytes Relative: 10 % (ref 3–12)
Neutro Abs: 4.2 10*3/uL (ref 1.7–7.7)
Neutrophils Relative %: 77 % (ref 43–77)
Platelets: 193 10*3/uL (ref 150–400)
RBC: 4.01 MIL/uL (ref 3.87–5.11)
RDW: 15.4 % (ref 11.5–15.5)
WBC: 5.4 10*3/uL (ref 4.0–10.5)

## 2015-04-24 LAB — COMPLETE METABOLIC PANEL WITH GFR
ALT: 29 U/L (ref 6–29)
AST: 25 U/L (ref 10–35)
Albumin: 3.7 g/dL (ref 3.6–5.1)
Alkaline Phosphatase: 61 U/L (ref 33–130)
BUN: 24 mg/dL (ref 7–25)
CO2: 28 mmol/L (ref 20–31)
Calcium: 9 mg/dL (ref 8.6–10.4)
Chloride: 105 mmol/L (ref 98–110)
Creat: 1.33 mg/dL — ABNORMAL HIGH (ref 0.60–0.88)
GFR, Est African American: 42 mL/min — ABNORMAL LOW (ref >=60)
GFR, Est Non African American: 36 mL/min — ABNORMAL LOW (ref >=60)
Glucose, Bld: 113 mg/dL — ABNORMAL HIGH (ref 70–99)
Potassium: 3.6 mmol/L (ref 3.5–5.3)
Sodium: 141 mmol/L (ref 135–146)
Total Bilirubin: 0.5 mg/dL (ref 0.2–1.2)
Total Protein: 6.2 g/dL (ref 6.1–8.1)

## 2015-04-24 MED ORDER — ATORVASTATIN CALCIUM 10 MG PO TABS: 10.0000 mg | ORAL_TABLET | Freq: Every day | ORAL | Status: DC

## 2015-04-25 ENCOUNTER — Ambulatory Visit: Payer: Medicare Other | Admitting: Family Medicine

## 2015-04-27 ENCOUNTER — Other Ambulatory Visit: Payer: Self-pay | Admitting: Family Medicine

## 2015-04-28 IMAGING — MG MM DIGITAL DIAGNOSTIC UNILAT*R*
3 series · 3 of 3 positions shown · non-contrast
Comparison: Previous exams.

CLINICAL DATA: Patient has a history of a recent biopsy proved
malignancy over the upper outer right breast diagnosed 10/20/2013
undergoing neoadjuvant chemotherapy and scheduled for radiation seed
localization with subsequent excision next week.

EXAM:
DIGITAL DIAGNOSTIC  right MAMMOGRAM WITH CAD
ULTRASOUND right BREAST

[R CC]
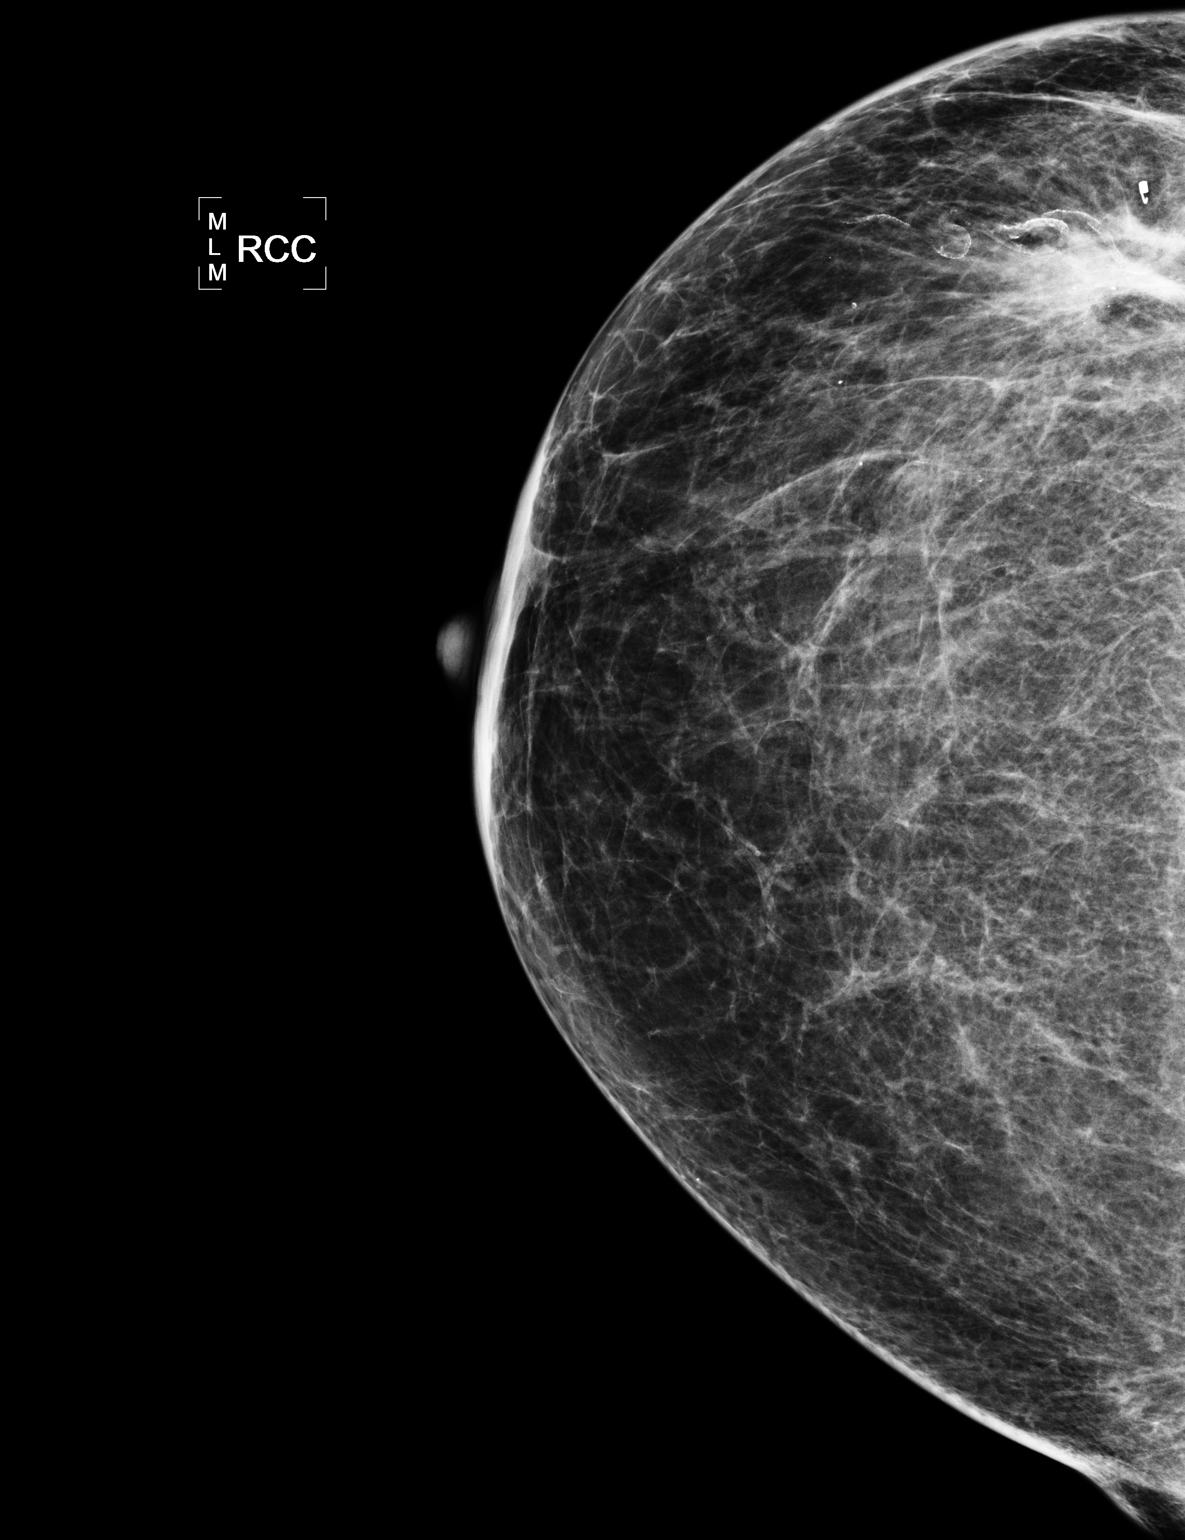

[R MLO]
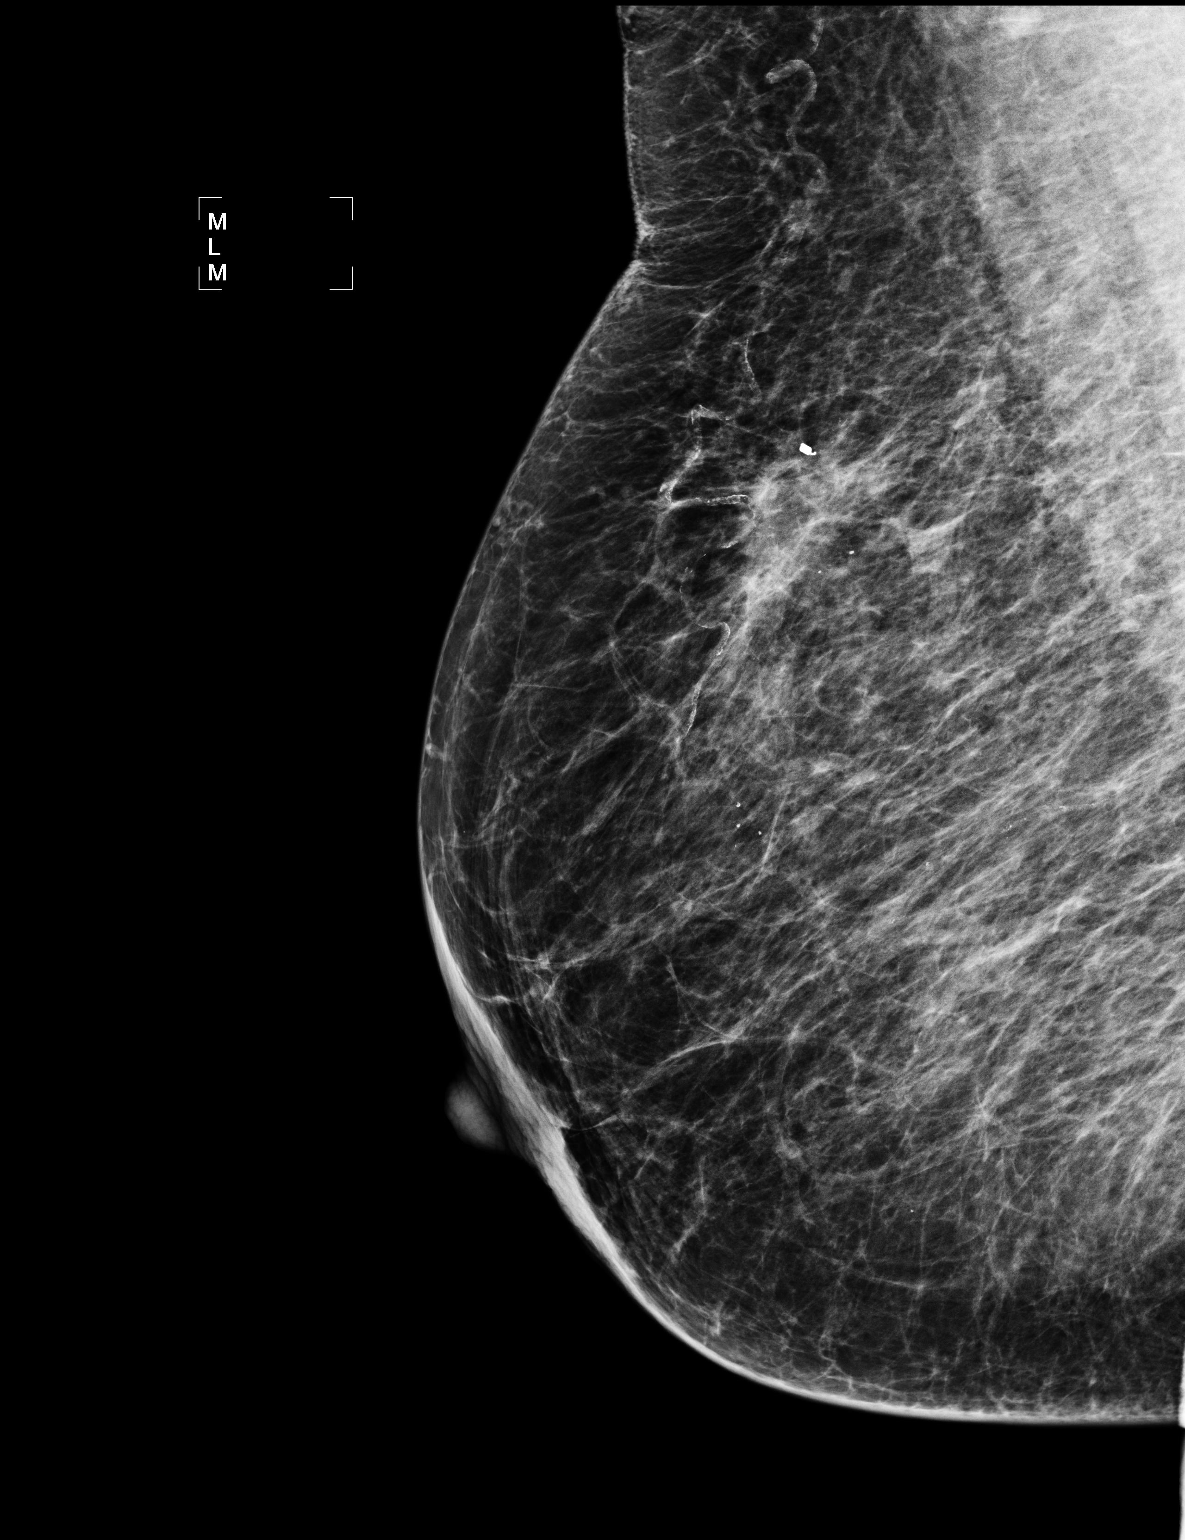

[R XCCL]
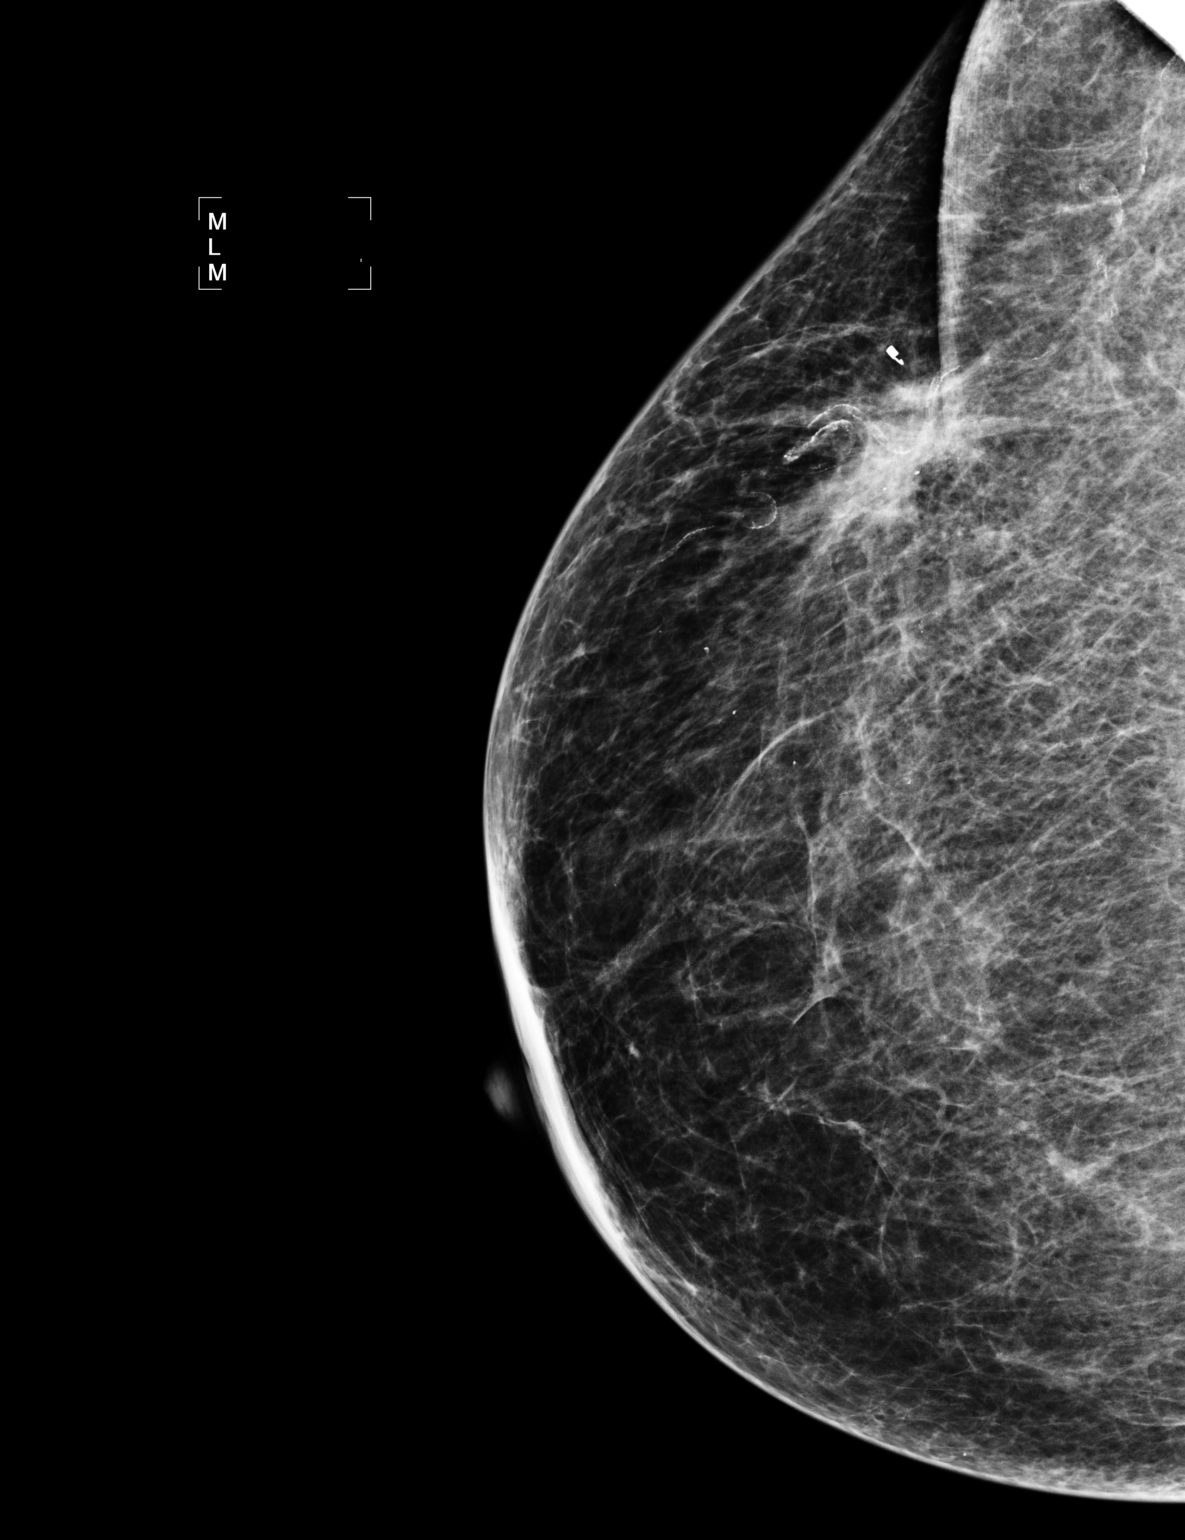

[3 of 3 positions shown; findings below may reference images not displayed]

ACR Breast Density Category b: There are scattered areas of
fibroglandular density.
FINDINGS: Exam demonstrates evidence of patient's biopsy proven malignancy
over the upper-outer quadrant of the right breast with metallic clip
along the superior lateral edge of the mass. This mass appears
unchanged on the CC image although appears slightly smaller and less
dense on the MLO view.

Mammographic images were processed with CAD.

Ultrasound is performed, showing evidence of patient's biopsy-proven
malignancy over the 10 o'clock position of the right breast 5 cm
from the nipple as this is slightly less well-defined compared to
the prior exam and measures approximately 1.1 x 1.3 x 1.7 cm
(previously 1.9 x 2.1 x 2.1 cm).
IMPRESSION: Slight decrease in size, but less well-defined biopsy-proven
malignancy over the 10 o'clock position of the right breast 5 cm
from the nipple measuring approximately 1.1 x 1.3 x 1.7 cm
(previously 1.9 x 2.1 x 2.1 cm).

RECOMMENDATION:
Recommend continued management for clinical treatment plan.

I have discussed the findings and recommendations with the patient.
Results were also provided in writing at the conclusion of the
visit. If applicable, a reminder letter will be sent to the patient
regarding the next appointment.

BI-RADS CATEGORY  6: Known biopsy-proven malignancy.

## 2015-04-30 ENCOUNTER — Ambulatory Visit (INDEPENDENT_AMBULATORY_CARE_PROVIDER_SITE_OTHER): Payer: Medicare Other | Admitting: Family Medicine

## 2015-04-30 ENCOUNTER — Encounter: Payer: Self-pay | Admitting: Family Medicine

## 2015-04-30 VITALS — BP 140/62 | HR 58 | Temp 98.3°F | Resp 18 | Wt 187.0 lb

## 2015-04-30 DIAGNOSIS — N183 Chronic kidney disease, stage 3 unspecified: Secondary | ICD-10-CM

## 2015-04-30 DIAGNOSIS — I10 Essential (primary) hypertension: Secondary | ICD-10-CM | POA: Diagnosis not present

## 2015-04-30 DIAGNOSIS — R600 Localized edema: Secondary | ICD-10-CM | POA: Diagnosis not present

## 2015-04-30 MED ORDER — DOXAZOSIN MESYLATE 4 MG PO TABS: 4.0000 mg | ORAL_TABLET | Freq: Every day | ORAL | Status: DC

## 2015-04-30 NOTE — Progress Notes (Signed)
Subjective:    Patient ID: Whitney [name], female    DOB: October 26, 1927, 82 y.o.   MRN: [id number]  HPI 09/28/14 Patient was recently seen by her endocrinologist. According to the patient he started her on spironolactone and discontinued her furosemide. She is also on Benicar HCT. Patient is unsure why the doctor did this. Her labwork recently revealed hypokalemia with a rise in her creatinine from 1.2-1.6. Patient now has bilateral tense pitting edema that is 2+ from her ankles to her knees. Patient had an echocardiogram of her heart in September of last year which revealed an ejection fraction of 60%.  She also has diabetes. At present her hemoglobin A1c is 6.5 when checked in May. She is currently on Actos as well as glipizide.  She is also taking amlodipine.  At that time, my plan was:  I believe this is likely multifactorial and related to patient taking Actos as well as amlodipine and the recent discontinuation of Lasix. I did not like the patient taking Lasix and hydrochlorothiazide together. I was unaware that she was doing this. This may have caused the rise in her creatinine. I will see the patient back on Thursday. I will have her temporarily discontinue Actos to see if this will improve her leg swelling. I will replace it with Januvia 100 mg a day. If on Thursday her leg swelling is better, I will discontinue hydrochlorothiazide I will continue the Benicar I will discontinue spironolactone and I will start the patient back on her Lasix and then recheck her renal function in 2 weeks  10/04/14 Since I last saw the patient, she has lost 2 pounds. The swelling is only slightly better in her legs. She still has +2 edema in her legs to the knee. Her legs are sore and tender from the swelling.  At that time, my plan was: Discontinue lisinopril/hydrochlorothiazide. Replace with lisinopril 20 mg a day. Resume Lasix 40 mg by mouth daily. Begin to wear knee-high compression stockings 15-20 mm, continue  to refrain from using Actos and continue to continue his Januvia 100 mg by mouth daily. Recheck kidney function and potassium in 2 weeks  10/23/14 Patient has lost 13 pounds since when I last saw her. She has been compliant wearing her compression stockings as well as taking her Lasix. Her blood pressure today is 128/58. There is no pitting edema in her legs. There is no shortness of breath. She denies any dry mouth and dizziness or lightheadedness. She does not appear dehydrated.  AT that time, my plan was: Clinically the patient has improved dramatically. There is no pitting edema. Her blood pressure stable. She has lost 13 pounds of excess fluid since discontinuing Actos, beginning the compression stockings, and starting back on the Lasix. However I need to monitor her kidney function to make sure that she is not developing prerenal azotemia. I also want to monitor her potassium. I recommended that she weigh herself every day and she continues to lose weight, we will need to stop the diaphoretic still avoid dehydration.  01/18/15 Recently, Dr. Dwyane Dee decreased lisinopril to 20 mg one half a pill a day. He also had the patient discontinue spironolactone due to the fact her potassium was elevated. Her renal function had worsened and her creatinine was greater than 1.7. She is still taking Lasix. This is managing the edema in her legs very well. Her weight is stable at 183. She has minimal pitting edema in both legs although she is also compliant with  wearing her compression stockings.  At that time, my plan was: Edema in the legs is well controlled. Continue to wear the compression stockings. I would also continue the furosemide as this seems to be managing her edema well. Her blood pressure is excellent. I will recheck her renal function off spironolactone and on the reduced dose of lisinopril.  If creatinine has improved and renal function is stable I will make no changes and I'll see the patient back in 3  months. If renal function has not improved with Dr. Ronnie Derby changes, I would proceed with a renal ultrasound, and SPEP and a UPEP, and possibly stopping her Lasix  04/23/15 Since I last saw the patient, she gained 5 pounds. Her blood pressure is also increased significantly to 160/70. She admits to eating more sodium are typically lunch meat, bread, and sandwiches. She is taking Lasix 40 mg a day along with potassium 20 mEq a day. Her kidney function was checked last month and was found to be stable and actually slightly better than previous. She denies any chest pain shortness of breath or dyspnea on exertion. She denies any myalgias or right upper quadrant pain. She denies any hypoglycemia. She recently saw her endocrinologist who according to the patient checked her hemoglobin A1c although she does not remember the value. She denies any polyuria, polydipsia, or blurred vision. She denies any syncope or presyncope or tachycardia arrhythmia secondary to her. History of paroxysmal atrial fibrillation. She is chronically anticoagulated for this and denies any bleeding or bruising.  At that time, my plan was:  pressures elevated and the patient appears to be retaining fluid.  I have recommended increasing Lasix to 40 mg by mouth twice a day. I will have the patient return in one week so that I can recheck her blood pressure and recheck her kidney function along with her potassium after diuresis. Consider at that time switching amlodipine to a different blood pressure medication to avoid any peripheral edema secondary to the amlodipine. I recommended the patient return fasting so that I can check a fasting lipid panel. Her diabetes is being managed by her endocrinologist. She is appropriately anticoagulated and her heart rate is well controlled. I will check a CBC to monitor for anemia on chronic anticoagulation.   04/30/15  weight is unchanged. She weighs 187 pounds today. She was 188 pounds last week. She  continues to have pitting edema in her legs even on Lasix 40 mg by mouth twice a day.   Past Medical History  Diagnosis Date  . Hypertension   . Hyperlipidemia   . Diabetes mellitus without complication ([place])   . Thyroid disease     hypothyroidism  . Anemia   . Former smoker   . Multinodular goiter   . Complication of anesthesia     slow to wake up  . Cancer ([place])     right breast  . Wears glasses   . Full dentures   . Dysrhythmia 10/15    AF  . Breast cancer ([place]) 10/2013    right upper outer  . Atrial fibrillation ([place])   . Radiation     Right Breast   Past Surgical History  Procedure Laterality Date  . Abdominal hysterectomy    . Portacath placement N/A 11/15/2013    Procedure: INSERTION PORT-A-CATH WITH ULTRA SOUND AND FLOROSCOPY;  Surgeon: [name], MD;  Location: [facility];  Service: General;  Laterality: N/A;  . Eye surgery  12/22/2009    cataracts  .  Total knee arthroplasty  2002    left  . Port-a-cath removal Right 01/09/2014    Procedure: REMOVAL PORT-A-CATH;  Surgeon: [name], MD;  Location: [facility];  Service: General;  Laterality: Right;  . Breast lumpectomy with radioactive seed localization Right 01/09/2014    Procedure: RIGHT BREAST SEED LOCALIZED LUMPECTOMY ;  Surgeon: [name], MD;  Location: [facility];  Service: General;  Laterality: Right;  . Axillary lymph node dissection Right 01/09/2014    Procedure: RIGHT AXILLARY LYMPH NODE DISECTION;  Surgeon: [name], MD;  Location: [facility];  Service: General;  Laterality: Right;  . Intramedullary (im) nail intertrochanteric Left 02/24/2014    Procedure: IM ROD LEFT HIP FX;  Surgeon: Alta Corning, MD;  Location: [facility];  Service: Orthopedics;  Laterality: Left;   Current Outpatient Prescriptions on File Prior to Visit  Medication Sig Dispense Refill  . amiodarone (PACERONE) 200 MG tablet TAKE 1 TABLET (200 MG TOTAL) BY MOUTH DAILY. 30 tablet  5  . amLODipine (NORVASC) 5 MG tablet TAKE 1 TABLET (5 MG TOTAL) BY MOUTH DAILY. 30 tablet 3  . atorvastatin (LIPITOR) 10 MG tablet Take 1 tablet (10 mg total) by mouth daily. 90 tablet 1  . Cholecalciferol (VITAMIN D) 2000 UNITS CAPS Take 2,000 Units by mouth daily.    Marland Kitchen docusate sodium 100 MG CAPS Take 100 mg by mouth 2 (two) times daily. 10 capsule 0  . ELIQUIS 5 MG TABS tablet TAKE 1 TABLET TWICE A DAY 60 tablet 11  . ferrous sulfate 325 (65 FE) MG EC tablet Take 325 mg by mouth daily with breakfast.    . furosemide (LASIX) 40 MG tablet TAKE 1 TABLET BY MOUTH EVERY DAY 30 tablet 5  . GLIPIZIDE XL 2.5 MG 24 hr tablet TAKE 1 TABLET (2.5 MG TOTAL) BY MOUTH DAILY WITH BREAKFAST. 30 tablet 3  . JANUVIA 100 MG tablet Take 100 mg by mouth daily.  3  . JANUVIA 100 MG tablet TAKE 1 TABLET EVERY DAY 30 tablet 3  . lisinopril (PRINIVIL,ZESTRIL) 20 MG tablet TAKE 1 TABLET BY MOUTH EVERY DAY 30 tablet 11  . metoprolol (LOPRESSOR) 100 MG tablet Take 100 mg by mouth 2 (two) times daily.    Glory Rosebush DELICA LANCETS 99991111 MISC Use to check blood sugar once a day dx code E11.9 100 each 1  . ONETOUCH VERIO test strip CHECK BLOOD SUGAR EVERY DAY AS DIRECTED 50 each 3  . Polyethyl Glycol-Propyl Glycol (SYSTANE OP) Place 1 drop into both eyes 3 (three) times daily.    . potassium chloride SA (K-DUR,KLOR-CON) 20 MEQ tablet Take 1 tablet daily 30 tablet 3  . tamoxifen (NOLVADEX) 20 MG tablet Take 1 tablet (20 mg total) by mouth daily. 90 tablet 3  . vitamin B-12 (CYANOCOBALAMIN) 1000 MCG tablet Take 1,000 mcg by mouth daily.     No current facility-administered medications on file prior to visit.   No Known Allergies Social History   Social History  . Marital Status: Widowed    Spouse Name: N/A  . Number of Children: 4  . Years of Education: N/A   Occupational History  . Not on file.   Social History Main Topics  . Smoking status: Former Smoker -- 1.00 packs/day for 5 years    Quit date: 11/14/1958    . Smokeless tobacco: Never Used  . Alcohol Use: No  . Drug Use: No  . Sexual Activity: No  Comment: menarche age 30, P29, first birth age 65, no HRT, menopause age 26   Other Topics Concern  . Not on file   Social History Narrative      Review of Systems  All other systems reviewed and are negative.      Objective:   Physical Exam  Neck: No JVD present.  Cardiovascular: Normal rate and normal heart sounds.   Pulmonary/Chest: Effort normal and breath sounds normal. No respiratory distress. She has no wheezes. She has no rales.  Abdominal: Soft. Bowel sounds are normal.  Musculoskeletal: She exhibits no edema.  Vitals reviewed.  +2 pitting edema in the legs distal to the knee       Assessment & Plan:  Benign essential HTN - Plan: doxazosin (CARDURA) 4 MG tablet, BASIC METABOLIC PANEL WITH GFR  CKD (chronic kidney disease) stage 3, GFR 30-59 ml/min  Bilateral leg edema  amlodipine as this is likely contributing to her pitting edema. Also decrease Lasix back to 40 mg a day. I will check renal function and her potassium. I will start the patient on Cardura 4 mg by mouth daily and recheck blood pressure in 2-4 weeks

## 2015-05-01 LAB — BASIC METABOLIC PANEL WITH GFR
BUN: 27 mg/dL — ABNORMAL HIGH (ref 7–25)
CO2: 31 mmol/L (ref 20–31)
Calcium: 9.1 mg/dL (ref 8.6–10.4)
Chloride: 102 mmol/L (ref 98–110)
Creat: 1.46 mg/dL — ABNORMAL HIGH (ref 0.60–0.88)
GFR, Est African American: 38 mL/min — ABNORMAL LOW (ref >=60)
GFR, Est Non African American: 33 mL/min — ABNORMAL LOW (ref >=60)
Glucose, Bld: 129 mg/dL — ABNORMAL HIGH (ref 70–99)
Potassium: 3.6 mmol/L (ref 3.5–5.3)
Sodium: 140 mmol/L (ref 135–146)

## 2015-05-05 ENCOUNTER — Other Ambulatory Visit: Payer: Self-pay | Admitting: Endocrinology

## 2015-05-13 ENCOUNTER — Ambulatory Visit: Payer: Medicare Other | Admitting: Family Medicine

## 2015-05-13 VITALS — BP 134/62

## 2015-05-13 DIAGNOSIS — I10 Essential (primary) hypertension: Secondary | ICD-10-CM

## 2015-05-13 DIAGNOSIS — R609 Edema, unspecified: Secondary | ICD-10-CM

## 2015-05-13 NOTE — Patient Instructions (Signed)
Per [facility] pt can increase her Lasik to 40mg  bid to see if that helps the swelling and if not to please call us back. Pt verbalizes understanding.

## 2015-05-17 ENCOUNTER — Telehealth: Payer: Self-pay | Admitting: Endocrinology

## 2015-05-17 NOTE — Telephone Encounter (Signed)
Noted, verbal order given to pharmacist.

## 2015-05-17 NOTE — Telephone Encounter (Signed)
Please see below and advise.

## 2015-05-17 NOTE — Telephone Encounter (Signed)
glpizide xl is manufacturer back order so what does the pharmacy need to do can they sub for the regular or what is the suggestion # [phone]

## 2015-05-17 NOTE — Telephone Encounter (Signed)
Amaryl 1 mg, a half tablet twice a day

## 2015-05-20 ENCOUNTER — Other Ambulatory Visit: Payer: Self-pay | Admitting: Endocrinology

## 2015-05-20 MED ORDER — METOPROLOL SUCCINATE ER 100 MG PO TB24: ORAL_TABLET | ORAL | Status: DC

## 2015-05-24 ENCOUNTER — Telehealth: Payer: Self-pay | Admitting: Endocrinology

## 2015-05-24 ENCOUNTER — Telehealth: Payer: Self-pay | Admitting: Family Medicine

## 2015-05-24 NOTE — Telephone Encounter (Signed)
Pt daughter called and said she had some questions for Suanne Marker and would like to speak to her. [phone] or [phone]

## 2015-05-24 NOTE — Telephone Encounter (Signed)
Patients daughter called, she is concerned about her moms glucose readings.  03/07 7:45 pm-96 03/08 7:24 pm-53 03/08 7:47 pm-100 03/09 6:29 am-82 03/10 7:09 am-64 03/10 7:21 am 80  She is only taking 1/2 of a glimeperide BID.  Please advise

## 2015-05-24 NOTE — Telephone Encounter (Signed)
pts weight has gone up 5.2 pounds in 3 days - per [facility]P increase Lasik 80mg  in am and 40 mg in pm. Pt and dtr aware and pt has appt here Monday where we will recheck her wt.

## 2015-05-24 NOTE — Telephone Encounter (Signed)
Noted, patient is aware. 

## 2015-05-24 NOTE — Telephone Encounter (Signed)
Stop the glimepiride dose in the evening

## 2015-05-27 ENCOUNTER — Encounter: Payer: Self-pay | Admitting: Family Medicine

## 2015-05-27 ENCOUNTER — Telehealth: Payer: Self-pay | Admitting: Endocrinology

## 2015-05-27 ENCOUNTER — Ambulatory Visit (INDEPENDENT_AMBULATORY_CARE_PROVIDER_SITE_OTHER): Payer: Medicare Other | Admitting: Family Medicine

## 2015-05-27 VITALS — BP 142/66 | HR 56 | Temp 98.1°F | Resp 14 | Wt 194.0 lb

## 2015-05-27 DIAGNOSIS — R609 Edema, unspecified: Secondary | ICD-10-CM | POA: Diagnosis not present

## 2015-05-27 DIAGNOSIS — I10 Essential (primary) hypertension: Secondary | ICD-10-CM | POA: Diagnosis not present

## 2015-05-27 MED ORDER — FUROSEMIDE 40 MG PO TABS: 80.0000 mg | ORAL_TABLET | Freq: Two times a day (BID) | ORAL | Status: DC

## 2015-05-27 NOTE — Telephone Encounter (Signed)
[facility] note dated 05/23/15 at 6:46 pm Caller states that her mother has a 69 BS reading.  Mother has changed from glipizide to glimepiride and she is only taking 1/2 BID. She started that on Friday. Her BS's started dropping low yesterday evening. Last night her BS was 57 and tonight it is 47. She has given her a hershey chocolate bar. Her bs was 82 this am. She had already taken her pill before taing her bs she took the last 1/2 tab at 5 pm Pt advised to call PCP in 24 hours

## 2015-05-27 NOTE — Progress Notes (Signed)
Subjective:    Patient ID: Whitney [name], female    DOB: 1927-09-17, 82 y.o.   MRN: [id number]  HPI  Wt Readings from Last 3 Encounters:  05/27/15 194 lb (87.998 kg)  04/30/15 187 lb (84.823 kg)  04/23/15 188 lb (85.276 kg)  the swelling in the patient's legs continued to worsen. They called me last week with 4 pound weight gain and +2 edema in both legs. Therefore I increased her Lasix to 80 mg in the morning and 40 mg in the evening. The edema has improved slightly however not substantially. She denies any chest pain shortness of breath or dyspnea on exertion. Past Medical History  Diagnosis Date  . Hypertension   . Hyperlipidemia   . Diabetes mellitus without complication ([place])   . Thyroid disease     hypothyroidism  . Anemia   . Former smoker   . Multinodular goiter   . Complication of anesthesia     slow to wake up  . Cancer ([place])     right breast  . Wears glasses   . Full dentures   . Dysrhythmia 10/15    AF  . Breast cancer ([place]) 10/2013    right upper outer  . Atrial fibrillation ([place])   . Radiation     Right Breast   Past Surgical History  Procedure Laterality Date  . Abdominal hysterectomy    . Portacath placement N/A 11/15/2013    Procedure: INSERTION PORT-A-CATH WITH ULTRA SOUND AND FLOROSCOPY;  Surgeon: [name], MD;  Location: [facility];  Service: General;  Laterality: N/A;  . Eye surgery  12/22/2009    cataracts  . Total knee arthroplasty  2002    left  . Port-a-cath removal Right 01/09/2014    Procedure: REMOVAL PORT-A-CATH;  Surgeon: [name], MD;  Location: [place] [facility];  Service: General;  Laterality: Right;  . Breast lumpectomy with radioactive seed localization Right 01/09/2014    Procedure: RIGHT BREAST SEED LOCALIZED LUMPECTOMY ;  Surgeon: [name], MD;  Location: [facility];  Service: General;  Laterality: Right;  . Axillary lymph node dissection Right 01/09/2014    Procedure: RIGHT AXILLARY LYMPH  NODE DISECTION;  Surgeon: [name], MD;  Location: [facility];  Service: General;  Laterality: Right;  . Intramedullary (im) nail intertrochanteric Left 02/24/2014    Procedure: IM ROD LEFT HIP FX;  Surgeon: Alta Corning, MD;  Location: [facility];  Service: Orthopedics;  Laterality: Left;   Medication list is reviewed No Known Allergies Social History   Social History  . Marital Status: Widowed    Spouse Name: N/A  . Number of Children: 4  . Years of Education: N/A   Occupational History  . Not on file.   Social History Main Topics  . Smoking status: Former Smoker -- 1.00 packs/day for 5 years    Quit date: 11/14/1958  . Smokeless tobacco: Never Used  . Alcohol Use: No  . Drug Use: No  . Sexual Activity: No     Comment: menarche age 89, P46, first birth age 79, no HRT, menopause age 61   Other Topics Concern  . Not on file   Social History Narrative        Review of Systems  All other systems reviewed and are negative.      Objective:   Physical Exam  Neck: No JVD present.  Cardiovascular: Normal rate, regular rhythm and normal heart sounds.   Pulmonary/Chest: Effort normal  and breath sounds normal. No respiratory distress. She has no wheezes. She has no rales.  Musculoskeletal: She exhibits edema.          Assessment & Plan:  Edema, peripheral  Essential hypertension  Increase Lasix to 80 mg by mouth twice a day. Continue K-Dur 20 milliequivalents by mouth daily. I recommended that she elevate her feet as much as possible during the day. I recommended that she avoid salt. I recommended that she wear her support hose on a daily basis. Recheck swelling in one week. Patient may also be a good candidate for entresto in place of lisinopril.

## 2015-05-27 NOTE — Telephone Encounter (Signed)
Patient was advised on 03/10 to just take 1/2 tablet once a day, she understood.  I called her just now to see if she remembers that she is only to take 1/2 a day and she understood.

## 2015-06-10 ENCOUNTER — Telehealth: Payer: Self-pay | Admitting: Endocrinology

## 2015-06-10 NOTE — Telephone Encounter (Signed)
Patient granddaughter would like to talk to you about her medication.

## 2015-06-10 NOTE — Telephone Encounter (Signed)
No answer on call back 

## 2015-06-11 NOTE — Telephone Encounter (Signed)
[facility] note dated 06/10/15 at 7:27 pm Caller states her mothers med got changed Glimiperide 1 mg and the pharmacy wont refill it until 4/1

## 2015-06-11 NOTE — Telephone Encounter (Signed)
Patient daughter ask if patient could get enough medicine glimepiride until her next refill In April, please advise  [phone]

## 2015-06-11 NOTE — Telephone Encounter (Signed)
I tried calling the patients home number and her granddaughters cell, could not leave a message on either of them, Patient is only suppose to be taking 1/2 tablet of Glimepiride once a day. Per Telephone note dated 03/10 and 03/13.

## 2015-06-12 ENCOUNTER — Other Ambulatory Visit: Payer: Self-pay | Admitting: *Deleted

## 2015-06-12 MED ORDER — GLIMEPIRIDE 1 MG PO TABS
ORAL_TABLET | ORAL | Status: DC
Start: 2015-06-12 — End: 2015-09-04

## 2015-06-12 NOTE — Telephone Encounter (Signed)
rx was sent for 4 tablets, patient will have to pay cash as it's to soon to fill through her insurance.

## 2015-06-17 ENCOUNTER — Ambulatory Visit (INDEPENDENT_AMBULATORY_CARE_PROVIDER_SITE_OTHER): Payer: Medicare Other | Admitting: Endocrinology

## 2015-06-17 ENCOUNTER — Encounter: Payer: Self-pay | Admitting: Endocrinology

## 2015-06-17 VITALS — BP 154/78 | HR 57 | Temp 98.8°F | Resp 14 | Ht 65.0 in | Wt 192.8 lb

## 2015-06-17 DIAGNOSIS — E042 Nontoxic multinodular goiter: Secondary | ICD-10-CM | POA: Diagnosis not present

## 2015-06-17 DIAGNOSIS — E119 Type 2 diabetes mellitus without complications: Secondary | ICD-10-CM | POA: Diagnosis not present

## 2015-06-17 LAB — COMPREHENSIVE METABOLIC PANEL
ALT: 30 U/L (ref 0–35)
AST: 28 U/L (ref 0–37)
Albumin: 3.6 g/dL (ref 3.5–5.2)
Alkaline Phosphatase: 57 U/L (ref 39–117)
BUN: 27 mg/dL — ABNORMAL HIGH (ref 6–23)
CO2: 31 mEq/L (ref 19–32)
Calcium: 9.2 mg/dL (ref 8.4–10.5)
Chloride: 105 mEq/L (ref 96–112)
Creatinine, Ser: 1.31 mg/dL — ABNORMAL HIGH (ref 0.40–1.20)
GFR: 49.59 mL/min — ABNORMAL LOW (ref >60.00)
Glucose, Bld: 120 mg/dL — ABNORMAL HIGH (ref 70–99)
Potassium: 3.8 mEq/L (ref 3.5–5.1)
Sodium: 142 mEq/L (ref 135–145)
Total Bilirubin: 0.6 mg/dL (ref 0.2–1.2)
Total Protein: 6.3 g/dL (ref 6.0–8.3)

## 2015-06-17 LAB — TSH: TSH: 0.71 u[IU]/mL (ref 0.35–4.50)

## 2015-06-17 LAB — POCT GLYCOSYLATED HEMOGLOBIN (HGB A1C): Hemoglobin A1C: 5.3

## 2015-06-17 NOTE — Progress Notes (Signed)
Quick Note:  Please let patient know that the lab result is normal and no further action needed ______ 

## 2015-06-17 NOTE — Patient Instructions (Signed)
Check blood sugars on waking up 2  times a week  Also check blood sugars about 2 hours after a meal and do this after different meals by rotation  Recommended blood sugar levels on waking up is 90-130 and about 2 hours after meal is 130-160  Please bring your blood sugar monitor to each visit, thank you  

## 2015-06-17 NOTE — Progress Notes (Signed)
Patient ID: Whitney [name], female   DOB: 12/09/1927, 82 y.o.   MRN: [id number]   Reason for Appointment: Diabetes and other problems  History of Present Illness    Type 2 DIABETES MELITUS, date of diagnosis:  1983     Previous history: She had been taking metformin and Actos for several years.  She usually has had excellent control with upper normal A1c  Recent history:   Oral hypoglycemic drugs: glipizide ER 2.5 mg daily    She was previously on  Metformin and Actos; because of edema she was told to stop Actos She was started on glipizide ER 2.5 mg daily instead, subsequently metformin was stopped because of renal dysfunction However because of nonavailability of this at the pharmacy she was tried on Amaryl 0.5 mg twice a day  However with this she started getting low sugars after supper and early morning and the evening dose was stopped A1c is excellent today but this may be falsely low Her granddaughter is checking her blood sugars mostly 1 hour after supper Still has tendency to gain weight       Monitors blood glucose:   Once a day .    Glucometer: One Touch.          Blood Glucose readings from download:  Mean values apply above for all meters except median for One Touch  PRE-MEAL Fasting Lunch Dinner PCS  Overall  Glucose range: 64-82  68, 87   47-287    Mean/median:    124  112     Physical activity: exercise: Only walking around the house       Diet: She stays away from fast food, sometimes will have some light lemonade                Wt Readings from Last 3 Encounters:  06/17/15 192 lb 12.8 oz (87.454 kg)  05/27/15 194 lb (87.998 kg)  04/30/15 187 lb (84.823 kg)    LABS:  Lab Results  Component Value Date   HGBA1C 5.3 06/17/2015   HGBA1C 5 03/19/2015   HGBA1C 5.5 12/17/2014   Lab Results  Component Value Date   MICROALBUR 2.7* 09/19/2014   LDLCALC 88 03/19/2015   CREATININE 1.46* 04/30/2015     OTHER problems including renal  dysfunction are discussed in review of systems      Medication List       This list is accurate as of: 06/17/15 12:38 PM.  Always use your most recent med list.               amiodarone 200 MG tablet  Commonly known as:  PACERONE  TAKE 1 TABLET (200 MG TOTAL) BY MOUTH DAILY.     amLODipine 5 MG tablet  Commonly known as:  NORVASC  TAKE 1 TABLET (5 MG TOTAL) BY MOUTH DAILY.     atorvastatin 10 MG tablet  Commonly known as:  LIPITOR  Take 1 tablet (10 mg total) by mouth daily.     doxazosin 4 MG tablet  Commonly known as:  CARDURA  Take 1 tablet (4 mg total) by mouth daily.     DSS 100 MG Caps  Take 100 mg by mouth 2 (two) times daily.     ELIQUIS 5 MG Tabs tablet  Generic drug:  apixaban  TAKE 1 TABLET TWICE A DAY     ferrous sulfate 325 (65 FE) MG EC tablet  Take 325 mg by mouth daily with breakfast.  furosemide 40 MG tablet  Commonly known as:  LASIX  Take 2 tablets (80 mg total) by mouth 2 (two) times daily.     glimepiride 1 MG tablet  Commonly known as:  AMARYL  Take 1/2 tablet ONCE a day     lisinopril 20 MG tablet  Commonly known as:  PRINIVIL,ZESTRIL  TAKE 1 TABLET BY MOUTH EVERY DAY     metoprolol 100 MG tablet  Commonly known as:  LOPRESSOR  Take 100 mg by mouth 2 (two) times daily.     metoprolol succinate 100 MG 24 hr tablet  Commonly known as:  TOPROL-XL  Take 1 tablet twice a day Take with or immediately following a meal.     ONETOUCH DELICA LANCETS 99991111 Misc  Use to check blood sugar once a day dx code E11.9     ONETOUCH VERIO test strip  Generic drug:  glucose blood  CHECK BLOOD SUGAR EVERY DAY AS DIRECTED     potassium chloride SA 20 MEQ tablet  Commonly known as:  K-DUR,KLOR-CON  Take 1 tablet daily     SYSTANE OP  Place 1 drop into both eyes 3 (three) times daily.     tamoxifen 20 MG tablet  Commonly known as:  NOLVADEX  Take 1 tablet (20 mg total) by mouth daily.     vitamin B-12 1000 MCG tablet  Commonly known as:   CYANOCOBALAMIN  Take 1,000 mcg by mouth daily.     Vitamin D 2000 units Caps  Take 2,000 Units by mouth daily.        Allergies: No Known Allergies  Past Medical History  Diagnosis Date  . Hypertension   . Hyperlipidemia   . Diabetes mellitus without complication ([place])   . Thyroid disease     hypothyroidism  . Anemia   . Former smoker   . Multinodular goiter   . Complication of anesthesia     slow to wake up  . Cancer ([place])     right breast  . Wears glasses   . Full dentures   . Dysrhythmia 10/15    AF  . Breast cancer ([place]) 10/2013    right upper outer  . Atrial fibrillation ([place])   . Radiation     Right Breast    Past Surgical History  Procedure Laterality Date  . Abdominal hysterectomy    . Portacath placement N/A 11/15/2013    Procedure: INSERTION PORT-A-CATH WITH ULTRA SOUND AND FLOROSCOPY;  Surgeon: [name], MD;  Location: [facility];  Service: General;  Laterality: N/A;  . Eye surgery  12/22/2009    cataracts  . Total knee arthroplasty  2002    left  . Port-a-cath removal Right 01/09/2014    Procedure: REMOVAL PORT-A-CATH;  Surgeon: [name], MD;  Location: [facility];  Service: General;  Laterality: Right;  . Breast lumpectomy with radioactive seed localization Right 01/09/2014    Procedure: RIGHT BREAST SEED LOCALIZED LUMPECTOMY ;  Surgeon: [name], MD;  Location: [facility];  Service: General;  Laterality: Right;  . Axillary lymph node dissection Right 01/09/2014    Procedure: RIGHT AXILLARY LYMPH NODE DISECTION;  Surgeon: [name], MD;  Location: [facility];  Service: General;  Laterality: Right;  . Intramedullary (im) nail intertrochanteric Left 02/24/2014    Procedure: IM ROD LEFT HIP FX;  Surgeon: Alta Corning, MD;  Location: [facility];  Service: Orthopedics;  Laterality: Left;    No family history on file.  Social History:  reports that she quit smoking about 56 years ago. She has never  used smokeless tobacco. She reports that she does not drink alcohol or use illicit drugs.  Review of Systems:  Hypertension:  She is now taking lisinopril 20 mg along with amlodipine 5 mg daily and followed by primary care  EDEMA She is taking Lasix 40-80 for edema  Has more edema in the evenings, wearing Lasix talking was also Potassium has been low in the past  Renal function: Creatinine has increased recently possibly from use of lisinopril and diuretics Needs follow-up  Lab Results  Component Value Date   CREATININE 1.46* 04/30/2015   BUN 27* 04/30/2015   NA 140 04/30/2015   K 3.6 04/30/2015   CL 102 04/30/2015   CO2 31 04/30/2015    Lipids: Has been on long-term Lipitor with good control    Lab Results  Component Value Date   CHOL 164 03/19/2015   HDL 65.70 03/19/2015   LDLCALC 88 03/19/2015   TRIG 54.0 03/19/2015   CHOLHDL 3 03/19/2015    She has had vitamin D deficiency, her level was 35 previously with 2000 units vitamin D 3  HYPOTHYROIDISM: She has had a multinodular goiter andDoes not appear to have hypothyroidism. Levothyroxine supplements were stopped in 5/16 and she has continued to be euthyroid  Rarely difficulty swallowing Her recent labs show low normal TSH   Lab Results  Component Value Date   FREET4 1.26 03/19/2015   FREET4 1.42 11/08/2013   FREET4 1.37 11/24/2012   TSH 0.62 03/19/2015   TSH 0.249* 07/27/2014   TSH 0.549 01/30/2014   Last foot exam in 7/16  ADRENAL mass: She was evaluated by a PET scan after  diagnosis of breast cancer She was found to have a 3.7 cm hypermetabolic left adrenal mass  On a previous CT scan in 2011 she also had a 3.8 cm adrenal mass Cortisol level is normal  Examination:   BP 154/78 mmHg  Pulse 57  Temp(Src) 98.8 F (37.1 C)  Resp 14  Ht 5\' 5"  (1.651 m)  Wt 192 lb 12.8 oz (87.454 kg)  BMI 32.08 kg/m2  SpO2 97%  Body mass index is 32.08 kg/(m^2).   Thyroid is enlarged mostly on the right side,  better felt on swallowing, firm, slightly irregular and about 2-1/2-3 times normal 2+ ankle edema present  ASSESSMENT/ PLAN:  Diabetes type 2   Blood glucose control is fairly good with Amaryl 0.5 mg at breakfast She was not able to get glipizide ER because of manufacturer backlog No hypoglycemia recently with stopping the evening Amaryl She does have slightly high readings at times after supper but this is unusual and based on her diet Advised her granddaughter to check blood sugar 2 hours after eating instead of one hour  RENAL insufficiency:  Creatinine will be rechecked  Hypertension: Blood pressure is mildly increased on my measurement the second time Will defer management to PCP  Multinodular goiter: stable clinically, needs follow-up TSH periodically    Wanda Cellucci 06/17/2015, 12:38 PM

## 2015-06-20 ENCOUNTER — Encounter: Payer: Self-pay | Admitting: *Deleted

## 2015-06-20 ENCOUNTER — Encounter (INDEPENDENT_AMBULATORY_CARE_PROVIDER_SITE_OTHER): Payer: Self-pay | Admitting: *Deleted

## 2015-06-26 ENCOUNTER — Other Ambulatory Visit: Payer: Self-pay | Admitting: Hematology and Oncology

## 2015-06-27 ENCOUNTER — Other Ambulatory Visit: Payer: Self-pay | Admitting: *Deleted

## 2015-06-27 DIAGNOSIS — C50411 Malignant neoplasm of upper-outer quadrant of right female breast: Secondary | ICD-10-CM

## 2015-06-27 MED ORDER — TAMOXIFEN CITRATE 20 MG PO TABS: 20.0000 mg | ORAL_TABLET | Freq: Every day | ORAL | Status: DC

## 2015-07-08 ENCOUNTER — Other Ambulatory Visit: Payer: Self-pay | Admitting: Endocrinology

## 2015-07-17 ENCOUNTER — Ambulatory Visit (INDEPENDENT_AMBULATORY_CARE_PROVIDER_SITE_OTHER): Payer: Medicare Other | Admitting: Internal Medicine

## 2015-07-23 ENCOUNTER — Other Ambulatory Visit: Payer: Self-pay | Admitting: *Deleted

## 2015-07-23 DIAGNOSIS — I10 Essential (primary) hypertension: Secondary | ICD-10-CM

## 2015-07-23 MED ORDER — DOXAZOSIN MESYLATE 4 MG PO TABS
4.0000 mg | ORAL_TABLET | Freq: Every day | ORAL | Status: DC
Start: 2015-07-23 — End: 2015-10-16

## 2015-07-23 NOTE — Telephone Encounter (Signed)
Received fax requesting refill on Doxazosin.   Refill appropriate and filled per protocol.

## 2015-07-28 ENCOUNTER — Other Ambulatory Visit: Payer: Self-pay | Admitting: Family Medicine

## 2015-08-14 ENCOUNTER — Other Ambulatory Visit: Payer: Self-pay | Admitting: Family Medicine

## 2015-08-14 NOTE — Telephone Encounter (Signed)
Refill appropriate and filled per protocol. 

## 2015-09-04 ENCOUNTER — Other Ambulatory Visit: Payer: Self-pay | Admitting: Endocrinology

## 2015-09-11 ENCOUNTER — Other Ambulatory Visit: Payer: Self-pay | Admitting: Endocrinology

## 2015-09-16 ENCOUNTER — Ambulatory Visit (INDEPENDENT_AMBULATORY_CARE_PROVIDER_SITE_OTHER): Payer: Medicare Other | Admitting: Endocrinology

## 2015-09-16 ENCOUNTER — Encounter: Payer: Self-pay | Admitting: Endocrinology

## 2015-09-16 VITALS — BP 122/80 | HR 54 | Ht 65.0 in | Wt 189.0 lb

## 2015-09-16 DIAGNOSIS — E119 Type 2 diabetes mellitus without complications: Secondary | ICD-10-CM | POA: Diagnosis not present

## 2015-09-16 DIAGNOSIS — E042 Nontoxic multinodular goiter: Secondary | ICD-10-CM

## 2015-09-16 LAB — COMPREHENSIVE METABOLIC PANEL
ALT: 23 U/L (ref 0–35)
AST: 28 U/L (ref 0–37)
Albumin: 3.5 g/dL (ref 3.5–5.2)
Alkaline Phosphatase: 55 U/L (ref 39–117)
BUN: 27 mg/dL — ABNORMAL HIGH (ref 6–23)
CO2: 30 mEq/L (ref 19–32)
Calcium: 9.1 mg/dL (ref 8.4–10.5)
Chloride: 107 mEq/L (ref 96–112)
Creatinine, Ser: 1.39 mg/dL — ABNORMAL HIGH (ref 0.40–1.20)
GFR: 46.28 mL/min — ABNORMAL LOW (ref >60.00)
Glucose, Bld: 91 mg/dL (ref 70–99)
Potassium: 3.9 mEq/L (ref 3.5–5.1)
Sodium: 140 mEq/L (ref 135–145)
Total Bilirubin: 0.7 mg/dL (ref 0.2–1.2)
Total Protein: 6.3 g/dL (ref 6.0–8.3)

## 2015-09-16 LAB — MICROALBUMIN / CREATININE URINE RATIO
Creatinine,U: 78.9 mg/dL
Microalb Creat Ratio: 8.5 mg/g (ref 0.0–30.0)
Microalb, Ur: 6.7 mg/dL — ABNORMAL HIGH (ref 0.0–1.9)

## 2015-09-16 LAB — LIPID PANEL
Cholesterol: 162 mg/dL (ref 0–200)
HDL: 66.8 mg/dL (ref >39.00)
LDL Cholesterol: 85 mg/dL (ref 0–99)
NonHDL: 94.91
Total CHOL/HDL Ratio: 2
Triglycerides: 48 mg/dL (ref 0.0–149.0)
VLDL: 9.6 mg/dL (ref 0.0–40.0)

## 2015-09-16 LAB — HEMOGLOBIN A1C: Hgb A1c MFr Bld: 5.3 % (ref 4.6–6.5)

## 2015-09-16 LAB — TSH: TSH: 0.7 u[IU]/mL (ref 0.35–4.50)

## 2015-09-16 NOTE — Progress Notes (Signed)
Patient ID: Whitney [name], female   DOB: 1928/02/20, 82 y.o.   MRN: [id number]   Reason for Appointment: Diabetes and other problems  History of Present Illness    Type 2 DIABETES MELITUS, date of diagnosis:  1983     Previous history: She had been taking metformin and Actos for several years.  She usually has had excellent control with upper normal A1c  Recent history:   Oral hypoglycemic drugs: Amaryl 0.5 mg at breakfast    She was previously on  Metformin and Actos; because of edema she was told to stop Actos Her metformin was stopped because of renal dysfunction Now she is taking only 0.5 mg Amaryl in the morning, was having relatively low sugars at times with her evening dose previously Her A1c usually lower than expected for her blood sugars  Her granddaughter is checking her blood sugars mostly 1 hour after supper       Monitors blood glucose:   Once a day .    Glucometer: One Touch.          Blood Glucose readings from download:  Mean values apply above for all meters except median for One Touch  PRE-MEAL Fasting Lunch Dinner Bedtime Overall  Glucose range:    76-185    Mean/median:    123      Physical activity: exercise: Only walking around the house       Diet: She stays away from fast food, sometimes will have some light lemonade                Wt Readings from Last 3 Encounters:  09/16/15 189 lb (85.73 kg)  06/17/15 192 lb 12.8 oz (87.454 kg)  05/27/15 194 lb (87.998 kg)    LABS:  Lab Results  Component Value Date   HGBA1C 5.3 06/17/2015   HGBA1C 5 03/19/2015   HGBA1C 5.5 12/17/2014   Lab Results  Component Value Date   MICROALBUR 2.7* 09/19/2014   LDLCALC 88 03/19/2015   CREATININE 1.31* 06/17/2015     OTHER problems including renal dysfunction are discussed in review of systems      Medication List       This list is accurate as of: 09/16/15  9:58 AM.  Always use your most recent med list.               amiodarone  200 MG tablet  Commonly known as:  PACERONE  TAKE 1 TABLET (200 MG TOTAL) BY MOUTH DAILY.     amLODipine 5 MG tablet  Commonly known as:  NORVASC  TAKE 1 TABLET (5 MG TOTAL) BY MOUTH DAILY.     atorvastatin 10 MG tablet  Commonly known as:  LIPITOR  Take 1 tablet (10 mg total) by mouth daily.     doxazosin 4 MG tablet  Commonly known as:  CARDURA  Take 1 tablet (4 mg total) by mouth daily.     DSS 100 MG Caps  Take 100 mg by mouth 2 (two) times daily.     ELIQUIS 5 MG Tabs tablet  Generic drug:  apixaban  TAKE 1 TABLET TWICE A DAY     ferrous sulfate 325 (65 FE) MG EC tablet  Take 325 mg by mouth daily with breakfast.     furosemide 40 MG tablet  Commonly known as:  LASIX  Take 2 tablets (80 mg total) by mouth 2 (two) times daily.     glimepiride 1 MG tablet  Commonly  known as:  AMARYL  TAKE 1/2 TABLET BY MOUTH TWICE A DAY     JANUVIA 100 MG tablet  Generic drug:  sitaGLIPtin  TAKE 1 TABLET EVERY DAY     KLOR-CON M20 20 MEQ tablet  Generic drug:  potassium chloride SA  TAKE ONE TABLET BY MOUTH ONCE DAILY     lisinopril 20 MG tablet  Commonly known as:  PRINIVIL,ZESTRIL  TAKE 1 TABLET BY MOUTH EVERY DAY     metoprolol 100 MG tablet  Commonly known as:  LOPRESSOR  Take 100 mg by mouth 2 (two) times daily.     metoprolol succinate 100 MG 24 hr tablet  Commonly known as:  TOPROL-XL  TAKE 1 TABLET BY MOUTH TWICE A DAY WITH OR IMMEDIATELY FOLLOWING A MEAL     ONETOUCH DELICA LANCETS 99991111 Misc  Use to check blood sugar once a day dx code E11.9     ONETOUCH VERIO test strip  Generic drug:  glucose blood  CHECK BLOOD SUGAR EVERY DAY AS DIRECTED     SYSTANE OP  Place 1 drop into both eyes 3 (three) times daily.     tamoxifen 20 MG tablet  Commonly known as:  NOLVADEX  Take 1 tablet (20 mg total) by mouth daily.     vitamin B-12 1000 MCG tablet  Commonly known as:  CYANOCOBALAMIN  Take 1,000 mcg by mouth daily.     Vitamin D 2000 units Caps  Take 2,000  Units by mouth daily. Reported on 09/16/2015        Allergies: No Known Allergies  Past Medical History  Diagnosis Date  . Hypertension   . Hyperlipidemia   . Diabetes mellitus without complication ([place])   . Thyroid disease     hypothyroidism  . Anemia   . Former smoker   . Multinodular goiter   . Complication of anesthesia     slow to wake up  . Cancer ([place])     right breast  . Wears glasses   . Full dentures   . Dysrhythmia 10/15    AF  . Breast cancer ([place]) 10/2013    right upper outer  . Atrial fibrillation ([place])   . Radiation     Right Breast    Past Surgical History  Procedure Laterality Date  . Abdominal hysterectomy    . Portacath placement N/A 11/15/2013    Procedure: INSERTION PORT-A-CATH WITH ULTRA SOUND AND FLOROSCOPY;  Surgeon: [name], MD;  Location: [facility];  Service: General;  Laterality: N/A;  . Eye surgery  12/22/2009    cataracts  . Total knee arthroplasty  2002    left  . Port-a-cath removal Right 01/09/2014    Procedure: REMOVAL PORT-A-CATH;  Surgeon: [name], MD;  Location: [facility];  Service: General;  Laterality: Right;  . Breast lumpectomy with radioactive seed localization Right 01/09/2014    Procedure: RIGHT BREAST SEED LOCALIZED LUMPECTOMY ;  Surgeon: [name], MD;  Location: [facility];  Service: General;  Laterality: Right;  . Axillary lymph node dissection Right 01/09/2014    Procedure: RIGHT AXILLARY LYMPH NODE DISECTION;  Surgeon: [name], MD;  Location: [facility];  Service: General;  Laterality: Right;  . Intramedullary (im) nail intertrochanteric Left 02/24/2014    Procedure: IM ROD LEFT HIP FX;  Surgeon: Alta Corning, MD;  Location: [facility];  Service: Orthopedics;  Laterality: Left;    No family history on file.  Social History:  reports  that she quit smoking about 56 years ago. She has never used smokeless tobacco. She reports that she does not drink alcohol or  use illicit drugs.  Review of Systems:  Hypertension:  She is now taking lisinopril 20 mg along with amlodipine 5 mg daily and followed by primary care  EDEMA She is taking Lasix up to 80 mg twice a day for edema  Has more edema in the evenings, she is not wearing her compression stockings Potassium has been low in the past  Renal function: Creatinine has been variable, needs follow-up   Lab Results  Component Value Date   CREATININE 1.31* 06/17/2015   BUN 27* 06/17/2015   NA 142 06/17/2015   K 3.8 06/17/2015   CL 105 06/17/2015   CO2 31 06/17/2015    Lipids: Has been on long-term Lipitor with good control    Lab Results  Component Value Date   CHOL 164 03/19/2015   HDL 65.70 03/19/2015   LDLCALC 88 03/19/2015   TRIG 54.0 03/19/2015   CHOLHDL 3 03/19/2015    She has had vitamin D deficiency, her level was 35 previously with 2000 units vitamin D 3  HYPOTHYROIDISM: She has had a multinodular goiter Levothyroxine supplements were stopped in 5/16 and she has continued to be euthyroid  Rarely Has difficulty swallowing, not new Her recent labs show low normal TSH  Lab Results  Component Value Date   TSH 0.71 06/17/2015   TSH 0.62 03/19/2015   TSH 0.249* 07/27/2014   FREET4 1.26 03/19/2015   FREET4 1.42 11/08/2013   FREET4 1.37 11/24/2012    Last foot exam in 7/17  ADRENAL mass: She was evaluated by a PET scan after  diagnosis of breast cancer She was found to have a 3.7 cm hypermetabolic left adrenal mass  On a previous CT scan in 2011 she also had a 3.8 cm adrenal mass Cortisol level is normal  Examination:   BP 122/80 mmHg  Pulse 54  Ht 5\' 5"  (1.651 m)  Wt 189 lb (85.73 kg)  BMI 31.45 kg/m2  SpO2 96%  Body mass index is 31.45 kg/(m^2).   Thyroid is enlarged mostly on the right side, better felt on swallowing, firm, slightly irregular and about 2 times normal 2+ ankle edema present on the left, 1+ on the right  Diabetic Foot Exam - Simple   Simple  Foot Form  Diabetic Foot exam was performed with the following findings:  Yes   Visual Inspection  No deformities, no ulcerations, no other skin breakdown bilaterally:  Yes  Sensation Testing  Intact to touch and monofilament testing bilaterally:  Yes  Pulse Check  Posterior Tibialis and Dorsalis pulse intact bilaterally:  Yes  Comments       ASSESSMENT/ PLAN:  Diabetes type 2   Blood glucose control is fairly good with Amaryl 0.5 mg at breakfast as is by her home reading A1c pending She will continue the same regimen but check blood sugars at variable times  RENAL insufficiency:  Creatinine will be rechecked, continue follow-up with PCP   Hypertension: Blood pressure is controlled   Multinodular goiter: stable clinically, asymptomatic; needs follow-up TSH     Trayson Stitely 09/16/2015, 9:58 AM

## 2015-09-16 NOTE — Patient Instructions (Signed)
Check blood sugars on waking up 1-2   times a week Also check blood sugars about 2 hours after a meal and do this after different meals by rotation  Recommended blood sugar levels on waking up is 90-130 and about 2 hours after meal is 130-160  Please bring your blood sugar monitor to each visit, thank you  

## 2015-09-26 ENCOUNTER — Other Ambulatory Visit: Payer: Self-pay | Admitting: Endocrinology

## 2015-09-30 ENCOUNTER — Other Ambulatory Visit: Payer: Self-pay | Admitting: Hematology and Oncology

## 2015-09-30 DIAGNOSIS — Z853 Personal history of malignant neoplasm of breast: Secondary | ICD-10-CM

## 2015-10-16 ENCOUNTER — Other Ambulatory Visit: Payer: Self-pay | Admitting: Family Medicine

## 2015-10-16 ENCOUNTER — Other Ambulatory Visit: Payer: Self-pay

## 2015-10-16 DIAGNOSIS — I10 Essential (primary) hypertension: Secondary | ICD-10-CM

## 2015-10-16 MED ORDER — AMIODARONE HCL 200 MG PO TABS: ORAL_TABLET | ORAL | 2 refills | Status: DC

## 2015-10-16 MED ORDER — DOXAZOSIN MESYLATE 4 MG PO TABS: 4.0000 mg | ORAL_TABLET | Freq: Every day | ORAL | 2 refills | Status: DC

## 2015-10-16 MED ORDER — METOPROLOL SUCCINATE ER 100 MG PO TB24: ORAL_TABLET | ORAL | 1 refills | Status: DC

## 2015-10-16 MED ORDER — FUROSEMIDE 40 MG PO TABS: 80.0000 mg | ORAL_TABLET | Freq: Two times a day (BID) | ORAL | 2 refills | Status: DC

## 2015-10-16 NOTE — Telephone Encounter (Signed)
Medication called/sent to requested pharmacy  

## 2015-10-17 ENCOUNTER — Encounter: Payer: Self-pay | Admitting: Hematology and Oncology

## 2015-10-17 ENCOUNTER — Ambulatory Visit (HOSPITAL_BASED_OUTPATIENT_CLINIC_OR_DEPARTMENT_OTHER): Payer: Medicare Other | Admitting: Hematology and Oncology

## 2015-10-17 ENCOUNTER — Telehealth: Payer: Self-pay | Admitting: Hematology and Oncology

## 2015-10-17 DIAGNOSIS — C50411 Malignant neoplasm of upper-outer quadrant of right female breast: Secondary | ICD-10-CM | POA: Diagnosis not present

## 2015-10-17 NOTE — Progress Notes (Signed)
Patient Care Team: Nicholas Lose, MD as Consulting Physician (Hematology and Oncology) [name], MD as Consulting Physician (General Surgery) [name], MD as Consulting Physician (Radiation Oncology)  DIAGNOSIS: Breast cancer of upper-outer quadrant of right female breast [facility])   Staging form: Breast, AJCC 7th Edition   - Clinical: Stage IIB (T2, N1, cM0) - Signed by [name], MD on 11/01/2013   - Pathologic: Stage IIIA (T1c, N2a, cM0) - Unsigned  SUMMARY OF ONCOLOGIC HISTORY:   Breast cancer of upper-outer quadrant of right female breast ([place])   10/20/2013 Mammogram    Ultrasound and mammogram showed 2.1 x 2.1 x 1.9 cm right breast mass     10/20/2013 Initial Diagnosis    Breast cancer of upper-outer quadrant of right female breast. Invasive ductal cancer with lymphovascular invasion one lymph node biopsy that was positive for cancer grade 1; Her 2 Neg Ratio 0.96, ER100% PR 8% positive Ki 67: 11%;     10/31/2013 Breast MRI    Right breast upper outer quadrant: 3.1 x 1.9 x 2.2 cm: Right axillary lymph node 1.1 x 0.9 x 0.6 cm     11/16/2013 - 12/01/2013 Chemotherapy    Neoadjuvant dose dense Doxorubicin and Cyclophosphamide given on day 1 of a 14 day cycle with Neulasta given on day 2 for granulocyte support     01/09/2014 Surgery    Right breast lumpectomy: Invasive ductal carcinoma, grade 2, 1.8 cm with DCIS intermediate grade, lymphovascular invasion diffusely, 5 of 13 lymph nodes positive with extracapsular extension, ER positive, PR 80%, HER-2 negative ratio 0.96, Ki-67 11%     02/25/2014 - 02/28/2014 Hospital Admission    Left hip fracture as a result of a fall at home     05/15/2014 - 06/29/2014 Radiation Therapy    Adjuvant radiation therapy with Dr. Pablo Ledger     07/20/2014 -  Anti-estrogen oral therapy    Tamoxifen 20 mg daily      CHIEF COMPLIANT: Follow-up on tamoxifen therapy  INTERVAL HISTORY: Whitney [name] is a 82 year old with above-mentioned  history of right breast cancer treated with neoadjuvant chemotherapy followed by lumpectomy and radiation. She is currently on tamoxifen appears to be tolerating it extremely well. She does not have any hot flashes or myalgias. She had a previous left hip fracture which appears to be doing quite well she uses a cane to get around. She continues to have leg swelling. She takes Lasix for this.  REVIEW OF SYSTEMS:   Constitutional: Denies fevers, chills or abnormal weight loss Eyes: Denies blurriness of vision Ears, nose, mouth, throat, and face: Denies mucositis or sore throat, hearing impairment Respiratory: Denies cough, dyspnea or wheezes Cardiovascular: Denies palpitation, chest discomfort Gastrointestinal:  Denies nausea, heartburn or change in bowel habits Skin: Denies abnormal skin rashes Lymphatics: Denies new lymphadenopathy or easy bruising Neurological:Denies numbness, tingling or new weaknesses Behavioral/Psych: Mood is stable, no new changes  Extremities: Bilateral lower extremity edema Breast:  denies any pain or lumps or nodules in either breasts All other systems were reviewed with the patient and are negative.  I have reviewed the past medical history, past surgical history, social history and family history with the patient and they are unchanged from previous note.  ALLERGIES:  has No Known Allergies.  MEDICATIONS:  Current Outpatient Prescriptions  Medication Sig Dispense Refill  . amiodarone (PACERONE) 200 MG tablet TAKE 1 TABLET (200 MG TOTAL) BY MOUTH DAILY. 90 tablet 2  . amLODipine (NORVASC) 5 MG tablet TAKE  1 TABLET (5 MG TOTAL) BY MOUTH DAILY. 30 tablet 3  . atorvastatin (LIPITOR) 10 MG tablet Take 1 tablet (10 mg total) by mouth daily. 90 tablet 1  . Cholecalciferol (VITAMIN D) 2000 UNITS CAPS Take 2,000 Units by mouth daily. Reported on 09/16/2015    . docusate sodium 100 MG CAPS Take 100 mg by mouth 2 (two) times daily. (Patient not taking: Reported on 09/16/2015)  10 capsule 0  . doxazosin (CARDURA) 4 MG tablet Take 1 tablet (4 mg total) by mouth daily. 90 tablet 2  . ELIQUIS 5 MG TABS tablet TAKE 1 TABLET TWICE A DAY 60 tablet 11  . ferrous sulfate 325 (65 FE) MG EC tablet Take 325 mg by mouth daily with breakfast.    . furosemide (LASIX) 40 MG tablet Take 2 tablets (80 mg total) by mouth 2 (two) times daily. 360 tablet 2  . glimepiride (AMARYL) 1 MG tablet TAKE 1/2 TABLET BY MOUTH TWICE A DAY 30 tablet 3  . JANUVIA 100 MG tablet TAKE 1 TABLET EVERY DAY 30 tablet 3  . KLOR-CON M20 20 MEQ tablet TAKE ONE TABLET BY MOUTH ONCE DAILY 30 tablet 3  . lisinopril (PRINIVIL,ZESTRIL) 20 MG tablet TAKE 1 TABLET BY MOUTH EVERY DAY 30 tablet 11  . metoprolol (LOPRESSOR) 100 MG tablet Take 100 mg by mouth 2 (two) times daily.    . metoprolol succinate (TOPROL-XL) 100 MG 24 hr tablet TAKE 1 TABLET BY MOUTH TWICE A DAY WITH OR IMMEDIATELY FOLLOWING A MEAL 180 tablet 1  . ONETOUCH DELICA LANCETS 29J MISC Use to check blood sugar once a day dx code E11.9 100 each 1  . ONETOUCH VERIO test strip CHECK BLOOD SUGAR EVERY DAY AS DIRECTED 50 each 3  . Polyethyl Glycol-Propyl Glycol (SYSTANE OP) Place 1 drop into both eyes 3 (three) times daily.    . tamoxifen (NOLVADEX) 20 MG tablet Take 1 tablet (20 mg total) by mouth daily. 90 tablet 3  . vitamin B-12 (CYANOCOBALAMIN) 1000 MCG tablet Take 1,000 mcg by mouth daily.     No current facility-administered medications for this visit.     PHYSICAL EXAMINATION: ECOG PERFORMANCE STATUS: 1 - Symptomatic but completely ambulatory  Vitals:   10/17/15 1414  BP: 126/84  Pulse: (!) 57  Resp: 18  Temp: 98.6 F (37 C)   Filed Weights   10/17/15 1414  Weight: 189 lb 3.2 oz (85.8 kg)    GENERAL:alert, no distress and comfortable SKIN: skin color, texture, turgor are normal, no rashes or significant lesions EYES: normal, Conjunctiva are pink and non-injected, sclera clear OROPHARYNX:no exudate, no erythema and lips, buccal  mucosa, and tongue normal  NECK: supple, thyroid normal size, non-tender, without nodularity LYMPH:  no palpable lymphadenopathy in the cervical, axillary or inguinal LUNGS: clear to auscultation and percussion with normal breathing effort HEART: regular rate & rhythm and no murmurs and no lower extremity edema ABDOMEN:abdomen soft, non-tender and normal bowel sounds MUSCULOSKELETAL:no cyanosis of digits and no clubbing  NEURO: alert & oriented x 3 with fluent speech, no focal motor/sensory deficits EXTREMITIES: No lower extremity edema BREAST: No palpable masses or nodules in either right or left breasts. No palpable axillary supraclavicular or infraclavicular adenopathy no breast tenderness or nipple discharge. (exam performed in the presence of a chaperone)  LABORATORY DATA:  I have reviewed the data as listed   Chemistry      Component Value Date/Time   NA 140 09/16/2015 0954   NA 141 12/22/2013 1000  K 3.9 09/16/2015 0954   K 3.1 (L) 12/22/2013 1000   CL 107 09/16/2015 0954   CO2 30 09/16/2015 0954   CO2 30 (H) 12/22/2013 1000   BUN 27 (H) 09/16/2015 0954   BUN 26.0 12/22/2013 1000   CREATININE 1.39 (H) 09/16/2015 0954   CREATININE 1.46 (H) 04/30/2015 1545   CREATININE 1.3 (H) 12/22/2013 1000      Component Value Date/Time   CALCIUM 9.1 09/16/2015 0954   CALCIUM 10.0 12/22/2013 1000   ALKPHOS 55 09/16/2015 0954   ALKPHOS 66 12/22/2013 1000   AST 28 09/16/2015 0954   AST 11 12/22/2013 1000   ALT 23 09/16/2015 0954   ALT 8 12/22/2013 1000   BILITOT 0.7 09/16/2015 0954   BILITOT 0.29 12/22/2013 1000       Lab Results  Component Value Date   WBC 5.4 04/23/2015   HGB 11.7 (L) 04/23/2015   HCT 36.2 04/23/2015   MCV 90.3 04/23/2015   PLT 193 04/23/2015   NEUTROABS 4.2 04/23/2015     ASSESSMENT & PLAN:  Breast cancer of upper-outer quadrant of right female breast T1 CN2A M0 stage IIIa invasive ductal carcinoma right breast status post lumpectomy 5/13 lymph nodes  positive with lymphovascular invasion and extracapsular extension. ER positive PR positive HER-2 negative Ki-67 11%. Patient was hospitalized for left hip fracture 02/25/2014, completed adjuvant radiation therapy 06/29/14, Started Tamoxifen 07/20/14  Tamoxifen Toxicities:no toxicities to tamoxifen. Denies any hot flashes or myalgias.  Breast Cancer Surveillance: 1. Breast exam 10/17/15: no lumps or nodules or any concerns for breast cancer. 2. Mammogram 10/31/14: No breast cancer, post-op changes density B  Leg edema: I discussed with her the importance of exercise in decreasing the leg swelling. She is currently on Lasix Mild chronic kidney disease: This is being monitored by her primary care physician.  RTC in 6 months For surveillance and follow-up.   No orders of the defined types were placed in this encounter.  The patient has a good understanding of the overall plan. she agrees with it. she will call with any problems that may develop before the next visit here.   [name], MD 10/17/15

## 2015-10-17 NOTE — Assessment & Plan Note (Signed)
Breast cancer of upper-outer quadrant of right female breast T1 CN2A M0 stage IIIa invasive ductal carcinoma right breast status post lumpectomy 5/13 lymph nodes positive with lymphovascular invasion and extracapsular extension. ER positive PR positive HER-2 negative Ki-67 11%. Patient was hospitalized for left hip fracture 02/25/2014, completed adjuvant radiation therapy 06/29/14, Started Tamoxifen 07/20/14  Tamoxifen Toxicities:no toxicities to tamoxifen. Denies any hot flashes or myalgias.  Breast Cancer Surveillance: 1. Breast exam 10/17/15: no lumps or nodules or any concerns for breast cancer. 2.Mammogram 10/31/14: No breast cancer, post-op changes density B  Leg edema: I discussed with her the importance of exercise in decreasing the leg swelling. Mild chronic kidney disease: This is being monitored by her primary care physician.  RTC in 6 months For surveillance and follow-up.

## 2015-10-17 NOTE — Telephone Encounter (Signed)
Gv pt appts for 04/20/16.

## 2015-10-30 ENCOUNTER — Other Ambulatory Visit: Payer: Self-pay | Admitting: Endocrinology

## 2015-11-01 ENCOUNTER — Ambulatory Visit
Admission: RE | Admit: 2015-11-01 | Discharge: 2015-11-01 | Disposition: A | Discharge status: Home / Self Care | Payer: Medicare Other | Source: Ambulatory Visit | Attending: Hematology and Oncology | Admitting: Hematology and Oncology

## 2015-11-01 DIAGNOSIS — R928 Other abnormal and inconclusive findings on diagnostic imaging of breast: Secondary | ICD-10-CM | POA: Diagnosis not present

## 2015-11-01 DIAGNOSIS — Z853 Personal history of malignant neoplasm of breast: Secondary | ICD-10-CM

## 2015-11-13 ENCOUNTER — Other Ambulatory Visit: Payer: Self-pay | Admitting: Endocrinology

## 2015-11-25 ENCOUNTER — Other Ambulatory Visit: Payer: Self-pay | Admitting: Family Medicine

## 2015-12-04 ENCOUNTER — Other Ambulatory Visit: Payer: Self-pay | Admitting: Family Medicine

## 2015-12-04 DIAGNOSIS — H524 Presbyopia: Secondary | ICD-10-CM | POA: Diagnosis not present

## 2015-12-04 DIAGNOSIS — H5203 Hypermetropia, bilateral: Secondary | ICD-10-CM | POA: Diagnosis not present

## 2015-12-04 DIAGNOSIS — E119 Type 2 diabetes mellitus without complications: Secondary | ICD-10-CM | POA: Diagnosis not present

## 2015-12-04 DIAGNOSIS — H52223 Regular astigmatism, bilateral: Secondary | ICD-10-CM | POA: Diagnosis not present

## 2015-12-04 LAB — HM DIABETES EYE EXAM

## 2015-12-19 ENCOUNTER — Encounter: Payer: Self-pay | Admitting: *Deleted

## 2015-12-19 ENCOUNTER — Ambulatory Visit (INDEPENDENT_AMBULATORY_CARE_PROVIDER_SITE_OTHER): Payer: Medicare Other | Admitting: *Deleted

## 2015-12-19 VITALS — BP 128/68

## 2015-12-19 DIAGNOSIS — I10 Essential (primary) hypertension: Secondary | ICD-10-CM

## 2015-12-19 DIAGNOSIS — Z23 Encounter for immunization: Secondary | ICD-10-CM | POA: Diagnosis not present

## 2015-12-19 NOTE — Progress Notes (Signed)
Patient seen in office for Influenza Vaccination.   Tolerated IM administration well.   Immunization history updated.   Also seen to monitor BP. Noted at 128/68.

## 2015-12-23 ENCOUNTER — Other Ambulatory Visit: Payer: Self-pay | Admitting: Endocrinology

## 2015-12-29 ENCOUNTER — Encounter (HOSPITAL_COMMUNITY): Payer: Self-pay | Admitting: Emergency Medicine

## 2015-12-29 ENCOUNTER — Emergency Department (HOSPITAL_COMMUNITY): Payer: Medicare Other

## 2015-12-29 ENCOUNTER — Emergency Department (HOSPITAL_COMMUNITY)
Admission: EM | Admit: 2015-12-29 | Discharge: 2015-12-29 | Disposition: A | Discharge status: Home / Self Care | Payer: Medicare Other | Source: Home / Self Care | Attending: Emergency Medicine | Admitting: Emergency Medicine

## 2015-12-29 DIAGNOSIS — E1122 Type 2 diabetes mellitus with diabetic chronic kidney disease: Secondary | ICD-10-CM | POA: Diagnosis not present

## 2015-12-29 DIAGNOSIS — R600 Localized edema: Secondary | ICD-10-CM

## 2015-12-29 DIAGNOSIS — E119 Type 2 diabetes mellitus without complications: Secondary | ICD-10-CM

## 2015-12-29 DIAGNOSIS — Z79810 Long term (current) use of selective estrogen receptor modulators (SERMs): Secondary | ICD-10-CM | POA: Diagnosis not present

## 2015-12-29 DIAGNOSIS — I272 Pulmonary hypertension, unspecified: Secondary | ICD-10-CM | POA: Diagnosis not present

## 2015-12-29 DIAGNOSIS — Z853 Personal history of malignant neoplasm of breast: Secondary | ICD-10-CM

## 2015-12-29 DIAGNOSIS — R6883 Chills (without fever): Secondary | ICD-10-CM

## 2015-12-29 DIAGNOSIS — Z923 Personal history of irradiation: Secondary | ICD-10-CM | POA: Diagnosis not present

## 2015-12-29 DIAGNOSIS — I110 Hypertensive heart disease with heart failure: Secondary | ICD-10-CM | POA: Insufficient documentation

## 2015-12-29 DIAGNOSIS — I130 Hypertensive heart and chronic kidney disease with heart failure and stage 1 through stage 4 chronic kidney disease, or unspecified chronic kidney disease: Secondary | ICD-10-CM | POA: Diagnosis not present

## 2015-12-29 DIAGNOSIS — N183 Chronic kidney disease, stage 3 (moderate): Secondary | ICD-10-CM | POA: Diagnosis not present

## 2015-12-29 DIAGNOSIS — I5032 Chronic diastolic (congestive) heart failure: Secondary | ICD-10-CM | POA: Insufficient documentation

## 2015-12-29 DIAGNOSIS — I480 Paroxysmal atrial fibrillation: Secondary | ICD-10-CM | POA: Diagnosis not present

## 2015-12-29 DIAGNOSIS — Z87891 Personal history of nicotine dependence: Secondary | ICD-10-CM | POA: Diagnosis not present

## 2015-12-29 DIAGNOSIS — Z7901 Long term (current) use of anticoagulants: Secondary | ICD-10-CM

## 2015-12-29 DIAGNOSIS — B9620 Unspecified Escherichia coli [E. coli] as the cause of diseases classified elsewhere: Secondary | ICD-10-CM | POA: Diagnosis not present

## 2015-12-29 DIAGNOSIS — E278 Other specified disorders of adrenal gland: Secondary | ICD-10-CM | POA: Diagnosis not present

## 2015-12-29 DIAGNOSIS — N179 Acute kidney failure, unspecified: Secondary | ICD-10-CM | POA: Diagnosis not present

## 2015-12-29 DIAGNOSIS — Z79899 Other long term (current) drug therapy: Secondary | ICD-10-CM | POA: Diagnosis not present

## 2015-12-29 DIAGNOSIS — J069 Acute upper respiratory infection, unspecified: Secondary | ICD-10-CM

## 2015-12-29 DIAGNOSIS — J90 Pleural effusion, not elsewhere classified: Secondary | ICD-10-CM | POA: Diagnosis not present

## 2015-12-29 DIAGNOSIS — Z96652 Presence of left artificial knee joint: Secondary | ICD-10-CM | POA: Insufficient documentation

## 2015-12-29 DIAGNOSIS — R7881 Bacteremia: Secondary | ICD-10-CM | POA: Diagnosis not present

## 2015-12-29 DIAGNOSIS — N390 Urinary tract infection, site not specified: Secondary | ICD-10-CM | POA: Diagnosis not present

## 2015-12-29 DIAGNOSIS — E039 Hypothyroidism, unspecified: Secondary | ICD-10-CM

## 2015-12-29 DIAGNOSIS — E785 Hyperlipidemia, unspecified: Secondary | ICD-10-CM | POA: Diagnosis not present

## 2015-12-29 DIAGNOSIS — R748 Abnormal levels of other serum enzymes: Secondary | ICD-10-CM | POA: Diagnosis not present

## 2015-12-29 DIAGNOSIS — R05 Cough: Secondary | ICD-10-CM | POA: Diagnosis not present

## 2015-12-29 DIAGNOSIS — Z7984 Long term (current) use of oral hypoglycemic drugs: Secondary | ICD-10-CM

## 2015-12-29 LAB — COMPREHENSIVE METABOLIC PANEL
ALT: 93 U/L — ABNORMAL HIGH (ref 14–54)
AST: 68 U/L — ABNORMAL HIGH (ref 15–41)
Albumin: 3 g/dL — ABNORMAL LOW (ref 3.5–5.0)
Alkaline Phosphatase: 51 U/L (ref 38–126)
Anion gap: 9 (ref 5–15)
BUN: 18 mg/dL (ref 6–20)
CO2: 26 mmol/L (ref 22–32)
Calcium: 8.8 mg/dL — ABNORMAL LOW (ref 8.9–10.3)
Chloride: 102 mmol/L (ref 101–111)
Creatinine, Ser: 1.27 mg/dL — ABNORMAL HIGH (ref 0.44–1.00)
GFR calc Af Amer: 43 mL/min — ABNORMAL LOW (ref >60)
GFR calc non Af Amer: 37 mL/min — ABNORMAL LOW (ref >60)
Glucose, Bld: 224 mg/dL — ABNORMAL HIGH (ref 65–99)
Potassium: 3.4 mmol/L — ABNORMAL LOW (ref 3.5–5.1)
Sodium: 137 mmol/L (ref 135–145)
Total Bilirubin: 0.8 mg/dL (ref 0.3–1.2)
Total Protein: 5.5 g/dL — ABNORMAL LOW (ref 6.5–8.1)

## 2015-12-29 LAB — CBC WITH DIFFERENTIAL/PLATELET
Basophils Absolute: 0 10*3/uL (ref 0.0–0.1)
Basophils Relative: 0 %
Eosinophils Absolute: 0 10*3/uL (ref 0.0–0.7)
Eosinophils Relative: 0 %
HCT: 30.9 % — ABNORMAL LOW (ref 36.0–46.0)
Hemoglobin: 10.1 g/dL — ABNORMAL LOW (ref 12.0–15.0)
Lymphocytes Relative: 2 %
Lymphs Abs: 0.1 10*3/uL — ABNORMAL LOW (ref 0.7–4.0)
MCH: 29.9 pg (ref 26.0–34.0)
MCHC: 32.7 g/dL (ref 30.0–36.0)
MCV: 91.4 fL (ref 78.0–100.0)
Monocytes Absolute: 0.2 10*3/uL (ref 0.1–1.0)
Monocytes Relative: 2 %
Neutro Abs: 9.1 10*3/uL — ABNORMAL HIGH (ref 1.7–7.7)
Neutrophils Relative %: 97 %
Platelets: 101 10*3/uL — ABNORMAL LOW (ref 150–400)
RBC: 3.38 MIL/uL — ABNORMAL LOW (ref 3.87–5.11)
RDW: 15.4 % (ref 11.5–15.5)
WBC: 9.5 10*3/uL (ref 4.0–10.5)

## 2015-12-29 LAB — I-STAT CG4 LACTIC ACID, ED: Lactic Acid, Venous: 1.62 mmol/L (ref 0.5–1.9)

## 2015-12-29 MED ORDER — SODIUM CHLORIDE 0.9 % IV BOLUS (SEPSIS)
500.0000 mL | Freq: Once | INTRAVENOUS | Status: AC
  Administered 2015-12-29: 500 mL via INTRAVENOUS

## 2015-12-29 MED ORDER — LEVOFLOXACIN IN D5W 750 MG/150ML IV SOLN
750.0000 mg | Freq: Once | INTRAVENOUS | Status: AC
  Administered 2015-12-29: 750 mg via INTRAVENOUS
  Filled 2015-12-29: qty 150

## 2015-12-29 MED ORDER — ACETAMINOPHEN 325 MG PO TABS: 650.0000 mg | ORAL_TABLET | Freq: Once | ORAL | Status: DC

## 2015-12-29 MED ORDER — LEVOFLOXACIN 500 MG PO TABS: 500.0000 mg | ORAL_TABLET | Freq: Every day | ORAL | 0 refills | Status: DC

## 2015-12-29 MED ORDER — SODIUM CHLORIDE 0.9 % IV BOLUS (SEPSIS): 1000.0000 mL | Freq: Once | INTRAVENOUS | Status: DC

## 2015-12-29 NOTE — ED Provider Notes (Signed)
[facility] DEPT Provider Note   CSN: [id number] Arrival date & time: 12/29/15  1107     History   Chief Complaint Chief Complaint  Patient presents with  . Generalized Body Aches  . Chills    HPI Whitney [name] is a 82 y.o. female.  Whitney [name] is an 81yo woman with PMH diastolic CHF, DM, PAF, breast cancer (right, s/p chemo/surg/RT right breast 2015, now on Tamoxifen), HTN, HLD who presents with intermittent chills and "shakiness" since yesterday. She attributes these symptoms to Aleve, which she took for some bilateral leg aching yesterday. She denies any other symptoms aside from this "shakiness", no chest pain, shortness of breath, palpitations, cough, dysuria, abdominal pain, N/V/D, dizziness, orthostatic symptoms. She has some chronic issues with constipation for which she takes laxatives.   The history is provided by the patient and a relative.    Past Medical History:  Diagnosis Date  . Anemia   . Atrial fibrillation ([place])   . Breast cancer ([place]) 10/2013   right upper outer  . Cancer ([place])    right breast  . Complication of anesthesia    slow to wake up  . Diabetes mellitus without complication ([place])   . Dysrhythmia 10/15   AF  . Former smoker   . Full dentures   . Hyperlipidemia   . Hypertension   . Multinodular goiter   . Radiation    Right Breast  . Thyroid disease    hypothyroidism  . Wears glasses     Patient Active Problem List   Diagnosis Date Noted  . Acute blood loss anemia 02/25/2014  . Closed left hip fracture ([place]) 02/24/2014  . PAF (paroxysmal atrial fibrillation) ([place]) 01/30/2014  . Anticoagulated 01/30/2014  . Chronic diastolic CHF (congestive heart failure), NYHA class 2 ([place]) 12/10/2013  . Preoperative cardiovascular examination 12/10/2013  . Neutropenic fever ([place]) 12/08/2013  . Anemia of chronic disease 12/08/2013  . Thrombocytopenia ([place]) 12/08/2013  . Hypokalemia 12/08/2013  . Pancytopenia due to antineoplastic  chemotherapy ([place]) 12/08/2013  . Hypothyroidism 12/08/2013  . Breast cancer of upper-outer quadrant of right female breast ([place]) 11/01/2013  . Essential hypertension, benign 12/01/2012  . Pure hypercholesterolemia 12/01/2012  . DM2 (diabetes mellitus, type 2) ([place]) 11/21/2012    Past Surgical History:  Procedure Laterality Date  . ABDOMINAL HYSTERECTOMY    . AXILLARY LYMPH NODE DISSECTION Right 01/09/2014   Procedure: RIGHT AXILLARY LYMPH NODE DISECTION;  Surgeon: [name], MD;  Location: [facility];  Service: General;  Laterality: Right;  . BREAST LUMPECTOMY WITH RADIOACTIVE SEED LOCALIZATION Right 01/09/2014   Procedure: RIGHT BREAST SEED LOCALIZED LUMPECTOMY ;  Surgeon: [name], MD;  Location: [facility];  Service: General;  Laterality: Right;  . EYE SURGERY  12/22/2009   cataracts  . INTRAMEDULLARY (IM) NAIL INTERTROCHANTERIC Left 02/24/2014   Procedure: IM ROD LEFT HIP FX;  Surgeon: Alta Corning, MD;  Location: [facility];  Service: Orthopedics;  Laterality: Left;  . PORT-A-CATH REMOVAL Right 01/09/2014   Procedure: REMOVAL PORT-A-CATH;  Surgeon: [name], MD;  Location: [facility];  Service: General;  Laterality: Right;  . PORTACATH PLACEMENT N/A 11/15/2013   Procedure: INSERTION PORT-A-CATH WITH ULTRA SOUND AND FLOROSCOPY;  Surgeon: [name], MD;  Location: [facility];  Service: General;  Laterality: N/A;  . TOTAL KNEE ARTHROPLASTY  2002   left    OB History    No data available       Home Medications  Prior to Admission medications   Medication Sig Start Date End Date Taking? Authorizing Provider  amiodarone (PACERONE) 200 MG tablet TAKE 1 TABLET (200 MG TOTAL) BY MOUTH DAILY. 10/16/15  Yes [name], MD  amLODipine (NORVASC) 5 MG tablet TAKE 1 TABLET (5 MG TOTAL) BY MOUTH DAILY. 02/20/15  Yes Elayne Snare, MD  atorvastatin (LIPITOR) 10 MG tablet TAKE 1 TABLET (10 MG TOTAL) BY MOUTH DAILY. 11/13/15  Yes Elayne Snare, MD  doxazosin (CARDURA) 4 MG tablet Take 1 tablet (4 mg total) by mouth daily. 10/16/15  Yes [name], MD  ELIQUIS 5 MG TABS tablet TAKE 1 TABLET TWICE A DAY 11/25/15  Yes [name], MD  ferrous sulfate 325 (65 FE) MG EC tablet Take 325 mg by mouth daily with breakfast.   Yes Historical Provider, MD  furosemide (LASIX) 40 MG tablet Take 2 tablets (80 mg total) by mouth 2 (two) times daily. 10/16/15  Yes [name], MD  glimepiride (AMARYL) 1 MG tablet TAKE 1/2 TABLET BY MOUTH TWICE A DAY 12/23/15  Yes Elayne Snare, MD  JANUVIA 100 MG tablet TAKE 1 TABLET EVERY DAY 12/04/15  Yes [name], MD  KLOR-CON M20 20 MEQ tablet TAKE ONE TABLET BY MOUTH ONCE DAILY 10/30/15  Yes Elayne Snare, MD  lisinopril (PRINIVIL,ZESTRIL) 20 MG tablet TAKE 1 TABLET BY MOUTH EVERY DAY 03/06/15  Yes [name], MD  metoprolol succinate (TOPROL-XL) 100 MG 24 hr tablet TAKE 1 TABLET BY MOUTH TWICE A DAY WITH OR IMMEDIATELY FOLLOWING A MEAL 10/16/15  Yes Elayne Snare, MD  Polyethyl Glycol-Propyl Glycol (SYSTANE OP) Place 1 drop into both eyes 3 (three) times daily.   Yes Historical Provider, MD  Sennosides (EX-LAX) 15 MG TABS Take 1 tablet by mouth daily as needed (CONSTIPATION).   Yes Historical Provider, MD  tamoxifen (NOLVADEX) 20 MG tablet Take 1 tablet (20 mg total) by mouth daily. 06/27/15  Yes Nicholas Lose, MD  vitamin B-12 (CYANOCOBALAMIN) 1000 MCG tablet Take 1,000 mcg by mouth daily.   Yes Historical Provider, MD  docusate sodium 100 MG CAPS Take 100 mg by mouth 2 (two) times daily. Patient not taking: Reported on 12/29/2015 02/26/14   Velvet Bathe, MD  levofloxacin (LEVAQUIN) 500 MG tablet Take 1 tablet (500 mg total) by mouth daily. 12/29/15   Asencion Partridge, MD    Family History No family history on file.  Social History Social History  Substance Use Topics  . Smoking status: Former Smoker    Packs/day: 1.00    Years: 5.00    Quit date: 11/14/1958  . Smokeless tobacco: Never Used    . Alcohol use No     Allergies   Review of patient's allergies indicates no known allergies.   Review of Systems Review of Systems  Constitutional: Positive for chills. Negative for activity change, diaphoresis, fatigue and fever.  HENT: Negative for congestion, rhinorrhea and sore throat.   Respiratory: Negative for cough, chest tightness, shortness of breath and wheezing.   Cardiovascular: Positive for leg swelling. Negative for chest pain and palpitations.  Gastrointestinal: Positive for constipation. Negative for abdominal distention, abdominal pain, diarrhea, nausea and vomiting.  Genitourinary: Negative for dysuria and frequency.     Physical Exam Updated Vital Signs BP 112/76   Pulse (!) 51   Temp 98.6 F (37 C) (Oral)   Resp 16   Ht 5\' 7"  (1.702 m)   Wt 85.7 kg   SpO2 97%   BMI 29.60  kg/m   Physical Exam  Constitutional: She is oriented to person, place, and time. She appears well-developed and well-nourished. No distress.  HENT:  Head: Normocephalic and atraumatic.  Mouth/Throat: Oropharynx is clear and moist.  Eyes: Conjunctivae and EOM are normal. Pupils are equal, round, and reactive to light.  Neck: Normal range of motion. Neck supple. JVD present.  Cardiovascular: Normal rate, regular rhythm and intact distal pulses.  Exam reveals no gallop and no friction rub.   Murmur (moderate systolic) heard. Pulmonary/Chest: Effort normal. No respiratory distress. She has no wheezes. She has rales (bibasilar).  Abdominal: Soft. Bowel sounds are normal. She exhibits no distension. There is no tenderness.  Musculoskeletal: Normal range of motion. She exhibits edema (1+ pitting to knees bilaterally (L>R chronic)).  Neurological: She is alert and oriented to person, place, and time.  Skin: Skin is warm and dry. Capillary refill takes less than 2 seconds. She is not diaphoretic.  Psychiatric: She has a normal mood and affect. Her behavior is normal. Thought content  normal.     ED Treatments / Results  Labs (all labs ordered are listed, but only abnormal results are displayed) Labs Reviewed  COMPREHENSIVE METABOLIC PANEL - Abnormal; Notable for the following:       Result Value   Potassium 3.4 (*)    Glucose, Bld 224 (*)    Creatinine, Ser 1.27 (*)    Calcium 8.8 (*)    Total Protein 5.5 (*)    Albumin 3.0 (*)    AST 68 (*)    ALT 93 (*)    GFR calc non Af Amer 37 (*)    GFR calc Af Amer 43 (*)    All other components within normal limits  CBC WITH DIFFERENTIAL/PLATELET - Abnormal; Notable for the following:    RBC 3.38 (*)    Hemoglobin 10.1 (*)    HCT 30.9 (*)    Platelets 101 (*)    Neutro Abs 9.1 (*)    Lymphs Abs 0.1 (*)    All other components within normal limits  CULTURE, BLOOD (ROUTINE X 2)  CULTURE, BLOOD (ROUTINE X 2)  I-STAT CG4 LACTIC ACID, ED  I-STAT CG4 LACTIC ACID, ED    EKG  EKG Interpretation None       Radiology Dg Chest 2 View  Result Date: 12/29/2015 CLINICAL DATA:  Cough.  Shaking at hands in legs after taking Aleve. EXAM: CHEST  2 VIEW COMPARISON:  One-view chest x-ray 02/24/2014 FINDINGS: Cardiomegaly is stable. Atherosclerotic changes are noted at the aortic arch. Emphysematous changes are present. Small bilateral pleural effusions are noted. There is minimal bibasilar atelectasis. No other focal airspace disease is present. Surgical clips are present at the right axilla. IMPRESSION: 1. Cardiomegaly and mild bilateral pleural effusions. These are nonspecific. They could be related to congestive heart failure although no significant edema is present. 2. Atherosclerosis of the aorta. 3. Surgical clips are present at the right axilla compatible with previous breast surgery and axillary node dissection. Electronically Signed   By: San Morelle M.D.   On: 12/29/2015 16:26    Procedures Procedures (including critical care time)  Medications Ordered in ED Medications  levofloxacin (LEVAQUIN) IVPB  750 mg (0 mg Intravenous Stopped 12/29/15 1929)  sodium chloride 0.9 % bolus 500 mL (0 mLs Intravenous Stopped 12/29/15 2027)     Initial Impression / Assessment and Plan / ED Course  I have reviewed the triage vital signs and the nursing notes.  Ms. Garth is  82 yo woman with PMH dCHF, DM, PAF, breast CA in remission who present for chills and found to have temp 100.8 with mild hypotension. No discernable source of infection, no leukocytosis or significant abnormality on CMP, lactic acid 1.6, U/A could not be collected, blood cultures drawn. Patient HDS and afebrile at time of evaluation with no acute complaints. CXR revealed cardiomegaly and mild bilateral pleural effusions, no focal airspace disease. Exam significant for bibasilar crackles heard on lung ausculation. Atypical pneumonia is a possibility. Patient provided with IVF, Levaquin IV. Remained asymptomatic and HDS for several hours. Discharged home with 7d course Levaquin for empiric treatment of suspected atypical pneumonia, will follow blood cultures. Return precautions given and instructed to follow up with PCP.    Final Clinical Impressions(s) / ED Diagnoses   Final diagnoses:  Chills  Upper respiratory tract infection, unspecified type    New Prescriptions Discharge Medication List as of 12/29/2015  8:14 PM    START taking these medications   Details  levofloxacin (LEVAQUIN) 500 MG tablet Take 1 tablet (500 mg total) by mouth daily., Starting Sun 12/29/2015, Print         Asencion Partridge, MD 12/30/15 0020    Drenda Freeze, MD 12/30/15 2023

## 2015-12-29 NOTE — Discharge Instructions (Signed)
[facility]as follow up with your PCP in about 2 weeks and take the prescribed antibiotic (Levaquin) daily for 7 days. If you develop worsening symptoms, cough, shortness of breath, dizziness, high fevers - please return to the ER immediately.

## 2015-12-29 NOTE — ED Notes (Signed)
Delay in blood draw,  edp examining pt 

## 2015-12-29 NOTE — ED Triage Notes (Signed)
Pt states she got a flu shot last week and last night she started having body aches and chills. Pt was just seen by EMS to be checked for "chills" and per family ems stated everything was "normal" so came to ed to be sure. Pt denies any pain

## 2015-12-30 ENCOUNTER — Inpatient Hospital Stay (HOSPITAL_COMMUNITY)
Admission: EM | Admit: 2015-12-30 | Discharge: 2016-01-02 | DRG: 872 | Disposition: A | Discharge status: Home / Self Care | Payer: Medicare Other | Attending: Internal Medicine | Admitting: Internal Medicine

## 2015-12-30 ENCOUNTER — Emergency Department (HOSPITAL_COMMUNITY): Payer: Medicare Other

## 2015-12-30 ENCOUNTER — Encounter (HOSPITAL_COMMUNITY): Payer: Self-pay

## 2015-12-30 ENCOUNTER — Telehealth (HOSPITAL_BASED_OUTPATIENT_CLINIC_OR_DEPARTMENT_OTHER): Payer: Self-pay | Admitting: Emergency Medicine

## 2015-12-30 ENCOUNTER — Telehealth: Payer: Self-pay | Admitting: *Deleted

## 2015-12-30 DIAGNOSIS — R7881 Bacteremia: Secondary | ICD-10-CM | POA: Diagnosis not present

## 2015-12-30 DIAGNOSIS — I10 Essential (primary) hypertension: Secondary | ICD-10-CM

## 2015-12-30 DIAGNOSIS — N179 Acute kidney failure, unspecified: Secondary | ICD-10-CM

## 2015-12-30 DIAGNOSIS — Z87891 Personal history of nicotine dependence: Secondary | ICD-10-CM

## 2015-12-30 DIAGNOSIS — E1122 Type 2 diabetes mellitus with diabetic chronic kidney disease: Secondary | ICD-10-CM | POA: Diagnosis present

## 2015-12-30 DIAGNOSIS — Z7901 Long term (current) use of anticoagulants: Secondary | ICD-10-CM | POA: Diagnosis not present

## 2015-12-30 DIAGNOSIS — N183 Chronic kidney disease, stage 3 (moderate): Secondary | ICD-10-CM | POA: Diagnosis present

## 2015-12-30 DIAGNOSIS — B9620 Unspecified Escherichia coli [E. coli] as the cause of diseases classified elsewhere: Secondary | ICD-10-CM | POA: Diagnosis present

## 2015-12-30 DIAGNOSIS — R748 Abnormal levels of other serum enzymes: Secondary | ICD-10-CM

## 2015-12-30 DIAGNOSIS — Z96652 Presence of left artificial knee joint: Secondary | ICD-10-CM | POA: Diagnosis present

## 2015-12-30 DIAGNOSIS — Z923 Personal history of irradiation: Secondary | ICD-10-CM | POA: Diagnosis not present

## 2015-12-30 DIAGNOSIS — N390 Urinary tract infection, site not specified: Secondary | ICD-10-CM | POA: Diagnosis present

## 2015-12-30 DIAGNOSIS — Z90710 Acquired absence of both cervix and uterus: Secondary | ICD-10-CM | POA: Diagnosis not present

## 2015-12-30 DIAGNOSIS — E118 Type 2 diabetes mellitus with unspecified complications: Secondary | ICD-10-CM | POA: Diagnosis not present

## 2015-12-30 DIAGNOSIS — E039 Hypothyroidism, unspecified: Secondary | ICD-10-CM | POA: Diagnosis present

## 2015-12-30 DIAGNOSIS — R7989 Other specified abnormal findings of blood chemistry: Secondary | ICD-10-CM

## 2015-12-30 DIAGNOSIS — Z79899 Other long term (current) drug therapy: Secondary | ICD-10-CM

## 2015-12-30 DIAGNOSIS — R945 Abnormal results of liver function studies: Secondary | ICD-10-CM

## 2015-12-30 DIAGNOSIS — E278 Other specified disorders of adrenal gland: Secondary | ICD-10-CM | POA: Diagnosis present

## 2015-12-30 DIAGNOSIS — R262 Difficulty in walking, not elsewhere classified: Secondary | ICD-10-CM

## 2015-12-30 DIAGNOSIS — E785 Hyperlipidemia, unspecified: Secondary | ICD-10-CM | POA: Diagnosis present

## 2015-12-30 DIAGNOSIS — I480 Paroxysmal atrial fibrillation: Secondary | ICD-10-CM | POA: Diagnosis present

## 2015-12-30 DIAGNOSIS — E119 Type 2 diabetes mellitus without complications: Secondary | ICD-10-CM | POA: Diagnosis not present

## 2015-12-30 DIAGNOSIS — J90 Pleural effusion, not elsewhere classified: Secondary | ICD-10-CM | POA: Diagnosis not present

## 2015-12-30 DIAGNOSIS — Z79810 Long term (current) use of selective estrogen receptor modulators (SERMs): Secondary | ICD-10-CM

## 2015-12-30 DIAGNOSIS — Z853 Personal history of malignant neoplasm of breast: Secondary | ICD-10-CM

## 2015-12-30 DIAGNOSIS — K5790 Diverticulosis of intestine, part unspecified, without perforation or abscess without bleeding: Secondary | ICD-10-CM | POA: Diagnosis not present

## 2015-12-30 DIAGNOSIS — I5032 Chronic diastolic (congestive) heart failure: Secondary | ICD-10-CM

## 2015-12-30 DIAGNOSIS — N182 Chronic kidney disease, stage 2 (mild): Secondary | ICD-10-CM

## 2015-12-30 DIAGNOSIS — N3000 Acute cystitis without hematuria: Secondary | ICD-10-CM

## 2015-12-30 DIAGNOSIS — Z7984 Long term (current) use of oral hypoglycemic drugs: Secondary | ICD-10-CM

## 2015-12-30 DIAGNOSIS — I272 Pulmonary hypertension, unspecified: Secondary | ICD-10-CM

## 2015-12-30 DIAGNOSIS — I130 Hypertensive heart and chronic kidney disease with heart failure and stage 1 through stage 4 chronic kidney disease, or unspecified chronic kidney disease: Secondary | ICD-10-CM | POA: Diagnosis present

## 2015-12-30 LAB — CBC WITH DIFFERENTIAL/PLATELET
Basophils Absolute: 0 10*3/uL (ref 0.0–0.1)
Basophils Relative: 0 %
Eosinophils Absolute: 0.1 10*3/uL (ref 0.0–0.7)
Eosinophils Relative: 1 %
HCT: 32.9 % — ABNORMAL LOW (ref 36.0–46.0)
Hemoglobin: 10.8 g/dL — ABNORMAL LOW (ref 12.0–15.0)
Lymphocytes Relative: 5 %
Lymphs Abs: 0.4 10*3/uL — ABNORMAL LOW (ref 0.7–4.0)
MCH: 29.8 pg (ref 26.0–34.0)
MCHC: 32.8 g/dL (ref 30.0–36.0)
MCV: 90.6 fL (ref 78.0–100.0)
Monocytes Absolute: 0.6 10*3/uL (ref 0.1–1.0)
Monocytes Relative: 7 %
Neutro Abs: 7.1 10*3/uL (ref 1.7–7.7)
Neutrophils Relative %: 87 %
Platelets: 116 10*3/uL — ABNORMAL LOW (ref 150–400)
RBC: 3.63 MIL/uL — ABNORMAL LOW (ref 3.87–5.11)
RDW: 15.6 % — ABNORMAL HIGH (ref 11.5–15.5)
WBC: 8.2 10*3/uL (ref 4.0–10.5)

## 2015-12-30 LAB — URINALYSIS, ROUTINE W REFLEX MICROSCOPIC
Bilirubin Urine: NEGATIVE
Glucose, UA: NEGATIVE mg/dL
Ketones, ur: NEGATIVE mg/dL
Nitrite: NEGATIVE
Protein, ur: NEGATIVE mg/dL
Specific Gravity, Urine: 1.014 (ref 1.005–1.030)
pH: 5 (ref 5.0–8.0)

## 2015-12-30 LAB — BLOOD CULTURE ID PANEL (REFLEXED)

## 2015-12-30 LAB — COMPREHENSIVE METABOLIC PANEL
ALT: 293 U/L — ABNORMAL HIGH (ref 14–54)
AST: 262 U/L — ABNORMAL HIGH (ref 15–41)
Albumin: 3.1 g/dL — ABNORMAL LOW (ref 3.5–5.0)
Alkaline Phosphatase: 52 U/L (ref 38–126)
Anion gap: 7 (ref 5–15)
BUN: 31 mg/dL — ABNORMAL HIGH (ref 6–20)
CO2: 28 mmol/L (ref 22–32)
Calcium: 9.3 mg/dL (ref 8.9–10.3)
Chloride: 103 mmol/L (ref 101–111)
Creatinine, Ser: 1.97 mg/dL — ABNORMAL HIGH (ref 0.44–1.00)
GFR calc Af Amer: 25 mL/min — ABNORMAL LOW (ref >60)
GFR calc non Af Amer: 22 mL/min — ABNORMAL LOW (ref >60)
Glucose, Bld: 85 mg/dL (ref 65–99)
Potassium: 3.7 mmol/L (ref 3.5–5.1)
Sodium: 138 mmol/L (ref 135–145)
Total Bilirubin: 0.6 mg/dL (ref 0.3–1.2)
Total Protein: 5.8 g/dL — ABNORMAL LOW (ref 6.5–8.1)

## 2015-12-30 LAB — URINE MICROSCOPIC-ADD ON

## 2015-12-30 MED ORDER — LISINOPRIL 20 MG PO TABS
20.0000 mg | ORAL_TABLET | Freq: Every day | ORAL | Status: DC
  Administered 2015-12-31 – 2016-01-02 (×3): 20 mg via ORAL
  Filled 2015-12-30 (×3): qty 1

## 2015-12-30 MED ORDER — APIXABAN 5 MG PO TABS
5.0000 mg | ORAL_TABLET | Freq: Two times a day (BID) | ORAL | Status: DC
  Administered 2015-12-31 – 2016-01-02 (×5): 5 mg via ORAL
  Filled 2015-12-30 (×5): qty 1

## 2015-12-30 MED ORDER — FERROUS SULFATE 325 (65 FE) MG PO TABS
325.0000 mg | ORAL_TABLET | Freq: Every day | ORAL | Status: DC
  Administered 2015-12-31 – 2016-01-02 (×3): 325 mg via ORAL
  Filled 2015-12-30 (×3): qty 1

## 2015-12-30 MED ORDER — DOXAZOSIN MESYLATE 4 MG PO TABS
4.0000 mg | ORAL_TABLET | Freq: Every day | ORAL | Status: DC
  Administered 2015-12-31 – 2016-01-02 (×3): 4 mg via ORAL
  Filled 2015-12-30 (×2): qty 1
  Filled 2015-12-30 (×2): qty 2
  Filled 2015-12-30: qty 1
  Filled 2015-12-30: qty 2

## 2015-12-30 MED ORDER — AMIODARONE HCL 200 MG PO TABS
200.0000 mg | ORAL_TABLET | Freq: Every day | ORAL | Status: DC
  Administered 2015-12-31 – 2016-01-02 (×3): 200 mg via ORAL
  Filled 2015-12-30 (×3): qty 1

## 2015-12-30 MED ORDER — LEVOFLOXACIN 500 MG PO TABS: 500.0000 mg | ORAL_TABLET | ORAL | Status: DC

## 2015-12-30 MED ORDER — ATORVASTATIN CALCIUM 10 MG PO TABS
10.0000 mg | ORAL_TABLET | Freq: Every day | ORAL | Status: DC
  Administered 2015-12-31 – 2016-01-02 (×3): 10 mg via ORAL
  Filled 2015-12-30 (×3): qty 1

## 2015-12-30 MED ORDER — LINAGLIPTIN 5 MG PO TABS
5.0000 mg | ORAL_TABLET | Freq: Every day | ORAL | Status: DC
  Administered 2015-12-31 – 2016-01-01 (×2): 5 mg via ORAL
  Filled 2015-12-30 (×2): qty 1

## 2015-12-30 MED ORDER — DEXTROSE 5 % IV SOLN: 1.0000 g | Freq: Once | INTRAVENOUS | Status: DC

## 2015-12-30 MED ORDER — POTASSIUM CHLORIDE CRYS ER 20 MEQ PO TBCR
20.0000 meq | EXTENDED_RELEASE_TABLET | Freq: Every day | ORAL | Status: DC
  Administered 2015-12-31 – 2016-01-02 (×3): 20 meq via ORAL
  Filled 2015-12-30 (×3): qty 1

## 2015-12-30 MED ORDER — GLIMEPIRIDE 1 MG PO TABS
0.5000 mg | ORAL_TABLET | Freq: Two times a day (BID) | ORAL | Status: DC
  Administered 2015-12-31 – 2016-01-02 (×4): 0.5 mg via ORAL
  Filled 2015-12-30 (×6): qty 0.5

## 2015-12-30 MED ORDER — TAMOXIFEN CITRATE 10 MG PO TABS
20.0000 mg | ORAL_TABLET | Freq: Every day | ORAL | Status: DC
  Administered 2015-12-31 – 2016-01-02 (×3): 20 mg via ORAL
  Filled 2015-12-30 (×3): qty 2

## 2015-12-30 MED ORDER — METOPROLOL SUCCINATE ER 25 MG PO TB24
100.0000 mg | ORAL_TABLET | Freq: Two times a day (BID) | ORAL | Status: DC
  Administered 2015-12-31 – 2016-01-02 (×5): 100 mg via ORAL
  Filled 2015-12-30 (×5): qty 4

## 2015-12-30 MED ORDER — SENNA 8.6 MG PO TABS: 2.0000 | ORAL_TABLET | Freq: Every day | ORAL | Status: DC | PRN

## 2015-12-30 MED ORDER — AMLODIPINE BESYLATE 5 MG PO TABS
5.0000 mg | ORAL_TABLET | Freq: Every day | ORAL | Status: DC
  Administered 2015-12-31 – 2016-01-02 (×3): 5 mg via ORAL
  Filled 2015-12-30 (×3): qty 1

## 2015-12-30 MED ORDER — VITAMIN B-12 1000 MCG PO TABS
1000.0000 ug | ORAL_TABLET | Freq: Every day | ORAL | Status: DC
  Administered 2015-12-31 – 2016-01-02 (×3): 1000 ug via ORAL
  Filled 2015-12-30 (×3): qty 1

## 2015-12-30 MED ORDER — FUROSEMIDE 20 MG PO TABS
80.0000 mg | ORAL_TABLET | Freq: Two times a day (BID) | ORAL | Status: DC
  Administered 2015-12-31: 80 mg via ORAL
  Filled 2015-12-30: qty 4

## 2015-12-30 MED ORDER — DEXTROSE 5 % IV SOLN
1.0000 g | Freq: Once | INTRAVENOUS | Status: DC
  Filled 2015-12-30: qty 10

## 2015-12-30 MED ORDER — DEXTROSE 5 % IV SOLN
2.0000 g | Freq: Every day | INTRAVENOUS | Status: DC
  Administered 2015-12-30 – 2016-01-01 (×3): 2 g via INTRAVENOUS
  Filled 2015-12-30 (×3): qty 2

## 2015-12-30 NOTE — ED Provider Notes (Signed)
[facility] DEPT Provider Note   CSN: [id number] Arrival date & time: 12/30/15  1502     History   Chief Complaint Chief Complaint  Patient presents with  . Abnormal Lab    HPI Whitney [name] is a 82 y.o. female.  HPI  Pt presenting with c/o abnormal lab- she hx CHF, DM, afib, breast cancer on Tamoxifen, HTN.  was called today about a positive blood culture that was drawn yesterday.  She was seen yesterday in the ED due to having chills and not feeling well.  She states that when she was in the ED she began to feel better.  Today she has been feeling well.  CXR yesterday showed possible pneumonia and patient was started on levaquin.  Per pt she was unable to get levaquin filled at pharmacy due to possible reaction with amiodarone that she also takes.  She states she has no current complaints now.  No vomiting.  No fever- did have 100.8 yesterday but none since.  No cough or difficulty breathing.  There are no other associated systemic symptoms, there are no other alleviating or modifying factors.   Past Medical History:  Diagnosis Date  . Anemia   . Atrial fibrillation ([place])   . Breast cancer ([place]) 10/2013   right upper outer  . Cancer ([place])    right breast  . Complication of anesthesia    slow to wake up  . Diabetes mellitus without complication ([place])   . Dysrhythmia 10/15   AF  . Former smoker   . Full dentures   . Hyperlipidemia   . Hypertension   . Multinodular goiter   . Radiation    Right Breast  . Thyroid disease    hypothyroidism  . Wears glasses     Patient Active Problem List   Diagnosis Date Noted  . Bacteremia due to Escherichia coli 12/30/2015  . UTI (urinary tract infection) 12/30/2015  . Acute blood loss anemia 02/25/2014  . Closed left hip fracture ([place]) 02/24/2014  . PAF (paroxysmal atrial fibrillation) ([place]) 01/30/2014  . Anticoagulated 01/30/2014  . Chronic diastolic CHF (congestive heart failure), NYHA class 2 ([place]) 12/10/2013  .  Preoperative cardiovascular examination 12/10/2013  . Neutropenic fever ([place]) 12/08/2013  . Anemia of chronic disease 12/08/2013  . Thrombocytopenia ([place]) 12/08/2013  . Hypokalemia 12/08/2013  . Pancytopenia due to antineoplastic chemotherapy ([place]) 12/08/2013  . Hypothyroidism 12/08/2013  . Breast cancer of upper-outer quadrant of right female breast ([place]) 11/01/2013  . Essential hypertension, benign 12/01/2012  . Pure hypercholesterolemia 12/01/2012  . DM2 (diabetes mellitus, type 2) ([place]) 11/21/2012    Past Surgical History:  Procedure Laterality Date  . ABDOMINAL HYSTERECTOMY    . AXILLARY LYMPH NODE DISSECTION Right 01/09/2014   Procedure: RIGHT AXILLARY LYMPH NODE DISECTION;  Surgeon: [name], MD;  Location: [facility];  Service: General;  Laterality: Right;  . BREAST LUMPECTOMY WITH RADIOACTIVE SEED LOCALIZATION Right 01/09/2014   Procedure: RIGHT BREAST SEED LOCALIZED LUMPECTOMY ;  Surgeon: [name], MD;  Location: [facility];  Service: General;  Laterality: Right;  . EYE SURGERY  12/22/2009   cataracts  . INTRAMEDULLARY (IM) NAIL INTERTROCHANTERIC Left 02/24/2014   Procedure: IM ROD LEFT HIP FX;  Surgeon: Alta Corning, MD;  Location: [facility];  Service: Orthopedics;  Laterality: Left;  . PORT-A-CATH REMOVAL Right 01/09/2014   Procedure: REMOVAL PORT-A-CATH;  Surgeon: [name], MD;  Location: [facility];  Service: General;  Laterality: Right;  .  PORTACATH PLACEMENT N/A 11/15/2013   Procedure: INSERTION PORT-A-CATH WITH ULTRA SOUND AND FLOROSCOPY;  Surgeon: [name], MD;  Location: [facility];  Service: General;  Laterality: N/A;  . TOTAL KNEE ARTHROPLASTY  2002   left    OB History    No data available       Home Medications    Prior to Admission medications   Medication Sig Start Date End Date Taking? Authorizing Provider  amiodarone (PACERONE) 200 MG tablet TAKE 1 TABLET (200 MG TOTAL) BY MOUTH DAILY.  10/16/15   [name], MD  amLODipine (NORVASC) 5 MG tablet TAKE 1 TABLET (5 MG TOTAL) BY MOUTH DAILY. 02/20/15   Elayne Snare, MD  atorvastatin (LIPITOR) 10 MG tablet TAKE 1 TABLET (10 MG TOTAL) BY MOUTH DAILY. 11/13/15   Elayne Snare, MD  doxazosin (CARDURA) 4 MG tablet Take 1 tablet (4 mg total) by mouth daily. 10/16/15   [name], MD  ELIQUIS 5 MG TABS tablet TAKE 1 TABLET TWICE A DAY 11/25/15   [name], MD  ferrous sulfate 325 (65 FE) MG EC tablet Take 325 mg by mouth daily with breakfast.    Historical Provider, MD  furosemide (LASIX) 40 MG tablet Take 2 tablets (80 mg total) by mouth 2 (two) times daily. 10/16/15   [name], MD  glimepiride (AMARYL) 1 MG tablet TAKE 1/2 TABLET BY MOUTH TWICE A DAY 12/23/15   Elayne Snare, MD  JANUVIA 100 MG tablet TAKE 1 TABLET EVERY DAY 12/04/15   [name], MD  KLOR-CON M20 20 MEQ tablet TAKE ONE TABLET BY MOUTH ONCE DAILY 10/30/15   Elayne Snare, MD  levofloxacin (LEVAQUIN) 500 MG tablet Take 1 tablet (500 mg total) by mouth daily. 12/29/15   Asencion Partridge, MD  lisinopril (PRINIVIL,ZESTRIL) 20 MG tablet TAKE 1 TABLET BY MOUTH EVERY DAY 03/06/15   [name], MD  metoprolol succinate (TOPROL-XL) 100 MG 24 hr tablet TAKE 1 TABLET BY MOUTH TWICE A DAY WITH OR IMMEDIATELY FOLLOWING A MEAL 10/16/15   Elayne Snare, MD  Polyethyl Glycol-Propyl Glycol (SYSTANE OP) Place 1 drop into both eyes 3 (three) times daily.    Historical Provider, MD  Sennosides (EX-LAX) 15 MG TABS Take 1 tablet by mouth daily as needed (CONSTIPATION).    Historical Provider, MD  tamoxifen (NOLVADEX) 20 MG tablet Take 1 tablet (20 mg total) by mouth daily. 06/27/15   Nicholas Lose, MD  vitamin B-12 (CYANOCOBALAMIN) 1000 MCG tablet Take 1,000 mcg by mouth daily.    Historical Provider, MD    Family History History reviewed. No pertinent family history.  Social History Social History  Substance Use Topics  . Smoking status: Former Smoker    Packs/day: 1.00    Years:  5.00    Quit date: 11/14/1958  . Smokeless tobacco: Never Used  . Alcohol use No     Allergies   Review of patient's allergies indicates no known allergies.   Review of Systems Review of Systems  ROS reviewed and all otherwise negative except for mentioned in HPI   Physical Exam Updated Vital Signs BP 144/63   Pulse (!) 53   Temp 98.6 F (37 C) (Oral)   Resp 17   SpO2 99%  Vitals reviewed Physical Exam Physical Examination: General appearance - alert, well appearing, and in no distress Mental status - alert, oriented to person, place, and time Eyes - no conjunctival injection no scleral icterus Mouth - mucous membranes moist, pharynx normal  without lesions Chest - clear to auscultation, no wheezes, rales or rhonchi, symmetric air entry Heart - normal rate, regular rhythm, normal S1, S2, no murmurs, rubs, clicks or gallops Abdomen - soft, nontender, nondistended, no masses or organomegaly Neurological - alert, oriented x 3, normal speech Extremities - peripheral pulses normal, no pedal edema, no clubbing or cyanosis Skin - normal coloration and turgor, no rashes  ED Treatments / Results  Labs (all labs ordered are listed, but only abnormal results are displayed) Labs Reviewed  COMPREHENSIVE METABOLIC PANEL - Abnormal; Notable for the following:       Result Value   BUN 31 (*)    Creatinine, Ser 1.97 (*)    Total Protein 5.8 (*)    Albumin 3.1 (*)    AST 262 (*)    ALT 293 (*)    GFR calc non Af Amer 22 (*)    GFR calc Af Amer 25 (*)    All other components within normal limits  CBC WITH DIFFERENTIAL/PLATELET - Abnormal; Notable for the following:    RBC 3.63 (*)    Hemoglobin 10.8 (*)    HCT 32.9 (*)    RDW 15.6 (*)    Platelets 116 (*)    Lymphs Abs 0.4 (*)    All other components within normal limits  URINALYSIS, ROUTINE W REFLEX MICROSCOPIC (NOT AT [facility]) - Abnormal; Notable for the following:    Color, Urine AMBER (*)    APPearance CLOUDY (*)    Hgb  urine dipstick MODERATE (*)    Leukocytes, UA MODERATE (*)    All other components within normal limits  URINE MICROSCOPIC-ADD ON - Abnormal; Notable for the following:    Squamous Epithelial / LPF 0-5 (*)    Bacteria, UA FEW (*)    Casts HYALINE CASTS (*)    All other components within normal limits  URINE CULTURE  CULTURE, BLOOD (ROUTINE X 2)  CULTURE, BLOOD (ROUTINE X 2)    EKG  EKG Interpretation None       Radiology Dg Chest 2 View  Result Date: 12/30/2015 CLINICAL DATA:  Patient with abnormal blood cultures.  Lethargy. EXAM: CHEST  2 VIEW COMPARISON:  Chest radiograph 12/29/2015. FINDINGS: Patient is rotated to the left. Stable cardiomegaly. Tortuosity of the thoracic aorta. No consolidative pulmonary opacities. No pleural effusion or pneumothorax. Minimal basilar heterogeneous opacities. Possible small bilateral pleural effusions. Thoracic spine degenerative changes. IMPRESSION: Cardiomegaly. Minimal atelectasis and small bilateral pleural effusions. Aortic atherosclerosis. Electronically Signed   By: Lovey Newcomer M.D.   On: 12/30/2015 17:01   Dg Chest 2 View  Result Date: 12/29/2015 CLINICAL DATA:  Cough.  Shaking at hands in legs after taking Aleve. EXAM: CHEST  2 VIEW COMPARISON:  One-view chest x-ray 02/24/2014 FINDINGS: Cardiomegaly is stable. Atherosclerotic changes are noted at the aortic arch. Emphysematous changes are present. Small bilateral pleural effusions are noted. There is minimal bibasilar atelectasis. No other focal airspace disease is present. Surgical clips are present at the right axilla. IMPRESSION: 1. Cardiomegaly and mild bilateral pleural effusions. These are nonspecific. They could be related to congestive heart failure although no significant edema is present. 2. Atherosclerosis of the aorta. 3. Surgical clips are present at the right axilla compatible with previous breast surgery and axillary node dissection. Electronically Signed   By: San Morelle M.D.   On: 12/29/2015 16:26    Procedures Procedures (including critical care time)  Medications Ordered in ED Medications  cefTRIAXone (ROCEPHIN) 2 g in  dextrose 5 % 50 mL IVPB (2 g Intravenous New Bag/Given 12/30/15 2341)  ferrous sulfate EC tablet 325 mg (not administered)  lisinopril (PRINIVIL,ZESTRIL) tablet 20 mg (not administered)  tamoxifen (NOLVADEX) tablet 20 mg (not administered)  doxazosin (CARDURA) tablet 4 mg (not administered)  furosemide (LASIX) tablet 80 mg (not administered)  potassium chloride SA (K-DUR,KLOR-CON) CR tablet 20 mEq (not administered)  atorvastatin (LIPITOR) tablet 10 mg (not administered)  apixaban (ELIQUIS) tablet 5 mg (not administered)  linagliptin (TRADJENTA) tablet 5 mg (not administered)  glimepiride (AMARYL) tablet 0.5 mg (not administered)  Sennosides TABS 15 mg (not administered)  levofloxacin (LEVAQUIN) tablet 500 mg (not administered)  amiodarone (PACERONE) tablet 200 mg (not administered)  amLODipine (NORVASC) tablet 5 mg (not administered)  metoprolol succinate (TOPROL-XL) 24 hr tablet 100 mg (not administered)  vitamin B-12 (CYANOCOBALAMIN) tablet 1,000 mcg (not administered)     Initial Impression / Assessment and Plan / ED Course  I have reviewed the triage vital signs and the nursing notes.  Pertinent labs & imaging results that were available during my care of the patient were reviewed by me and considered in my medical decision making (see chart for details).  Clinical Course    Pt presenting due to being notified of positive blood culture which was drawn yesterday.  Culture is positive for ecoli and enterobacter- most likely source is urine based on UA today.  CXR appears reassuring.  Pt started on IV rocephin, cultures re-drawn as well as urine culture.  Pt is overall well appearing.  Dr. Alcario Drought, [facility], is admitting to hospitalist service.    Final Clinical Impressions(s) / ED Diagnoses   Final diagnoses:    Bacteremia  Urinary tract infection without hematuria, site unspecified    New Prescriptions New Prescriptions   No medications on file     Alfonzo Beers, MD 12/30/15 2341

## 2015-12-30 NOTE — H&P (Signed)
History and Physical    Whitney [name] [id number] DOB: Sep 29, 1927 DOA: 12/30/2015   PCP: Odette Fraction, MD Chief Complaint:  Chief Complaint  Patient presents with  . Abnormal Lab    HPI: Whitney [name] is a 82 y.o. female with medical history significant of A.Fib, on eliquis, DM, HTN.  Patient returns to ED after being called with positive BCx.  She was here yesterday due to not feeling well with c/o fever, chills.  BCx taken yesterday which have now come back 1/2 positive for E.Coli.  She was discharged on levaquin yesterday which pharmacy wouldn't fill due to interaction with amiodarone which she also takes.  Today she has actually felt well, much better than yesterday and has no complaints.  Review of Systems: As per HPI otherwise 10 point review of systems negative.    Past Medical History:  Diagnosis Date  . Anemia   . Atrial fibrillation ([place])   . Breast cancer ([place]) 10/2013   right upper outer  . Cancer ([place])    right breast  . Complication of anesthesia    slow to wake up  . Diabetes mellitus without complication ([place])   . Dysrhythmia 10/15   AF  . Former smoker   . Full dentures   . Hyperlipidemia   . Hypertension   . Multinodular goiter   . Radiation    Right Breast  . Thyroid disease    hypothyroidism  . Wears glasses     Past Surgical History:  Procedure Laterality Date  . ABDOMINAL HYSTERECTOMY    . AXILLARY LYMPH NODE DISSECTION Right 01/09/2014   Procedure: RIGHT AXILLARY LYMPH NODE DISECTION;  Surgeon: [name], MD;  Location: [place];  Service: General;  Laterality: Right;  . BREAST LUMPECTOMY WITH RADIOACTIVE SEED LOCALIZATION Right 01/09/2014   Procedure: RIGHT BREAST SEED LOCALIZED LUMPECTOMY ;  Surgeon: [name], MD;  Location: [place];  Service: General;  Laterality: Right;  . EYE SURGERY  12/22/2009   cataracts  . INTRAMEDULLARY (IM) NAIL INTERTROCHANTERIC Left 02/24/2014   Procedure: IM ROD LEFT HIP FX;  Surgeon: Alta Corning, MD;  Location: [place];  Service: Orthopedics;  Laterality: Left;  . PORT-A-CATH REMOVAL Right 01/09/2014   Procedure: REMOVAL PORT-A-CATH;  Surgeon: [name], MD;  Location: [place];  Service: General;  Laterality: Right;  . PORTACATH PLACEMENT N/A 11/15/2013   Procedure: INSERTION PORT-A-CATH WITH ULTRA SOUND AND FLOROSCOPY;  Surgeon: [name], MD;  Location: [place];  Service: General;  Laterality: N/A;  . TOTAL KNEE ARTHROPLASTY  2002   left     reports that she quit smoking about 57 years ago. She has a 5.00 pack-year smoking history. She has never used smokeless tobacco. She reports that she does not drink alcohol or use drugs.  No Known Allergies  History reviewed. No pertinent family history. No recent illness in family.   Prior to Admission medications   Medication Sig Start Date End Date Taking? Authorizing Provider  amiodarone (PACERONE) 200 MG tablet TAKE 1 TABLET (200 MG TOTAL) BY MOUTH DAILY. 10/16/15   [name], MD  amLODipine (NORVASC) 5 MG tablet TAKE 1 TABLET (5 MG TOTAL) BY MOUTH DAILY. 02/20/15   Elayne Snare, MD  atorvastatin (LIPITOR) 10 MG tablet TAKE 1 TABLET (10 MG TOTAL) BY MOUTH DAILY. 11/13/15   Elayne Snare, MD  doxazosin (CARDURA) 4 MG tablet Take 1 tablet (4 mg total) by mouth daily. 10/16/15   Cletus Gash  Avel Peace, MD  ELIQUIS 5 MG TABS tablet TAKE 1 TABLET TWICE A DAY 11/25/15   [name], MD  ferrous sulfate 325 (65 FE) MG EC tablet Take 325 mg by mouth daily with breakfast.    Historical Provider, MD  furosemide (LASIX) 40 MG tablet Take 2 tablets (80 mg total) by mouth 2 (two) times daily. 10/16/15   [name], MD  glimepiride (AMARYL) 1 MG tablet TAKE 1/2 TABLET BY MOUTH TWICE A DAY 12/23/15   Elayne Snare, MD  JANUVIA 100 MG tablet TAKE 1 TABLET EVERY DAY 12/04/15   [name], MD  KLOR-CON M20 20 MEQ tablet TAKE ONE TABLET BY MOUTH ONCE DAILY 10/30/15   Elayne Snare,  MD  levofloxacin (LEVAQUIN) 500 MG tablet Take 1 tablet (500 mg total) by mouth daily. 12/29/15   Asencion Partridge, MD  lisinopril (PRINIVIL,ZESTRIL) 20 MG tablet TAKE 1 TABLET BY MOUTH EVERY DAY 03/06/15   [name], MD  metoprolol succinate (TOPROL-XL) 100 MG 24 hr tablet TAKE 1 TABLET BY MOUTH TWICE A DAY WITH OR IMMEDIATELY FOLLOWING A MEAL 10/16/15   Elayne Snare, MD  Polyethyl Glycol-Propyl Glycol (SYSTANE OP) Place 1 drop into both eyes 3 (three) times daily.    Historical Provider, MD  Sennosides (EX-LAX) 15 MG TABS Take 1 tablet by mouth daily as needed (CONSTIPATION).    Historical Provider, MD  tamoxifen (NOLVADEX) 20 MG tablet Take 1 tablet (20 mg total) by mouth daily. 06/27/15   Nicholas Lose, MD  vitamin B-12 (CYANOCOBALAMIN) 1000 MCG tablet Take 1,000 mcg by mouth daily.    Historical Provider, MD    Physical Exam: Vitals:   12/30/15 1741 12/30/15 2124 12/30/15 2130 12/30/15 2200  BP: 127/58 139/56 135/63 144/63  Pulse: (!) 51 (!) 49 (!) 51 (!) 53  Resp: 17 17    Temp: 98.6 F (37 C)     TempSrc: Oral     SpO2: 98% 96% 99% 99%      Constitutional: NAD, calm, comfortable Eyes: PERRL, lids and conjunctivae normal ENMT: Mucous membranes are moist. Posterior pharynx clear of any exudate or lesions.Normal dentition.  Neck: normal, supple, no masses, no thyromegaly Respiratory: clear to auscultation bilaterally, no wheezing, no crackles. Normal respiratory effort. No accessory muscle use.  Cardiovascular: Regular rate and rhythm, no murmurs / rubs / gallops. No extremity edema. 2+ pedal pulses. No carotid bruits.  Abdomen: no tenderness, no masses palpated. No hepatosplenomegaly. Bowel sounds positive.  Musculoskeletal: no clubbing / cyanosis. No joint deformity upper and lower extremities. Good ROM, no contractures. Normal muscle tone.  Skin: no rashes, lesions, ulcers. No induration Neurologic: CN 2-12 grossly intact. Sensation intact, DTR normal. Strength 5/5 in all 4.    Psychiatric: Normal judgment and insight. Alert and oriented x 3. Normal mood.    Labs on Admission: I have personally reviewed following labs and imaging studies  CBC:  Recent Labs Lab 12/29/15 1136 12/30/15 1636  WBC 9.5 8.2  NEUTROABS 9.1* 7.1  HGB 10.1* 10.8*  HCT 30.9* 32.9*  MCV 91.4 90.6  PLT 101* [id number]*   Basic Metabolic Panel:  Recent Labs Lab 12/29/15 1136 12/30/15 1636  NA 137 138  K 3.4* 3.7  CL 102 103  CO2 26 28  GLUCOSE 224* 85  BUN 18 31*  CREATININE 1.27* 1.97*  CALCIUM 8.8* 9.3   GFR: Estimated Creatinine Clearance: 23.5 mL/min (by C-G formula based on SCr of 1.97 mg/dL (H)). Liver Function Tests:  Recent Labs  Lab 12/29/15 1136 12/30/15 1636  AST 68* 262*  ALT 93* 293*  ALKPHOS 51 52  BILITOT 0.8 0.6  PROT 5.5* 5.8*  ALBUMIN 3.0* 3.1*   No results for input(s): LIPASE, AMYLASE in the last 168 hours. No results for input(s): AMMONIA in the last 168 hours. Coagulation Profile: No results for input(s): INR, PROTIME in the last 168 hours. Cardiac Enzymes: No results for input(s): CKTOTAL, CKMB, CKMBINDEX, TROPONINI in the last 168 hours. BNP (last 3 results) No results for input(s): PROBNP in the last 8760 hours. HbA1C: No results for input(s): HGBA1C in the last 72 hours. CBG: No results for input(s): GLUCAP in the last 168 hours. Lipid Profile: No results for input(s): CHOL, HDL, LDLCALC, TRIG, CHOLHDL, LDLDIRECT in the last 72 hours. Thyroid Function Tests: No results for input(s): TSH, T4TOTAL, FREET4, T3FREE, THYROIDAB in the last 72 hours. Anemia Panel: No results for input(s): VITAMINB12, FOLATE, FERRITIN, TIBC, IRON, RETICCTPCT in the last 72 hours. Urine analysis:    Component Value Date/Time   COLORURINE AMBER (A) 12/30/2015 1709   APPEARANCEUR CLOUDY (A) 12/30/2015 1709   LABSPEC 1.014 12/30/2015 1709   PHURINE 5.0 12/30/2015 1709   GLUCOSEU NEGATIVE 12/30/2015 1709   GLUCOSEU NEGATIVE 11/24/2012 0831   HGBUR MODERATE  (A) 12/30/2015 1709   BILIRUBINUR NEGATIVE 12/30/2015 1709   BILIRUBINUR neg 09/19/2014 [place] 12/30/2015 1709   PROTEINUR NEGATIVE 12/30/2015 1709   UROBILINOGEN 0.2 09/19/2014 1605   UROBILINOGEN 0.2 02/24/2014 0024   NITRITE NEGATIVE 12/30/2015 1709   LEUKOCYTESUR MODERATE (A) 12/30/2015 1709   Sepsis Labs: @LABRCNTIP (procalcitonin:4,lacticidven:4) ) Recent Results (from the past 240 hour(s))  Blood culture (routine x 2)     Status: None (Preliminary result)   Collection Time: 12/29/15  5:05 PM  Result Value Ref Range Status   Specimen Description BLOOD RIGHT ANTECUBITAL  Final   Special Requests BOTTLES DRAWN AEROBIC AND ANAEROBIC 5CC  Final   Culture  Setup Time   Final    GRAM NEGATIVE RODS AEROBIC BOTTLE ONLY Organism ID to follow CRITICAL RESULT CALLED TO, READ BACK BY AND VERIFIED WITH: Garlon Hatchet RN 13:10 12/30/15 ([email or web address])    Culture NO GROWTH < 24 HOURS  Final   Report Status PENDING  Incomplete  Blood Culture ID Panel (Reflexed)     Status: Abnormal   Collection Time: 12/29/15  5:05 PM  Result Value Ref Range Status   Enterococcus species NOT DETECTED NOT DETECTED Final   Listeria monocytogenes NOT DETECTED NOT DETECTED Final   Staphylococcus species NOT DETECTED NOT DETECTED Final   Staphylococcus aureus NOT DETECTED NOT DETECTED Final   Streptococcus species NOT DETECTED NOT DETECTED Final   Streptococcus agalactiae NOT DETECTED NOT DETECTED Final   Streptococcus pneumoniae NOT DETECTED NOT DETECTED Final   Streptococcus pyogenes NOT DETECTED NOT DETECTED Final   Acinetobacter baumannii NOT DETECTED NOT DETECTED Final   Enterobacteriaceae species DETECTED (A) NOT DETECTED Final    Comment: CRITICAL RESULT CALLED TO, READ BACK BY AND VERIFIED WITH: Garlon Hatchet RN 13:10 12/30/15 ([email or web address])    Enterobacter cloacae complex NOT DETECTED NOT DETECTED Final   Escherichia coli DETECTED (A) NOT DETECTED Final    Comment: CRITICAL RESULT CALLED TO,  READ BACK BY AND VERIFIED WITH: Garlon Hatchet RN 13:10 12/30/15 ([email or web address])    Klebsiella oxytoca NOT DETECTED NOT DETECTED Final   Klebsiella pneumoniae NOT DETECTED NOT DETECTED Final   Proteus species NOT DETECTED NOT DETECTED Final   Serratia marcescens NOT DETECTED NOT  DETECTED Final   Carbapenem resistance NOT DETECTED NOT DETECTED Final   Haemophilus influenzae NOT DETECTED NOT DETECTED Final   Neisseria meningitidis NOT DETECTED NOT DETECTED Final   Pseudomonas aeruginosa NOT DETECTED NOT DETECTED Final   Candida albicans NOT DETECTED NOT DETECTED Final   Candida glabrata NOT DETECTED NOT DETECTED Final   Candida krusei NOT DETECTED NOT DETECTED Final   Candida parapsilosis NOT DETECTED NOT DETECTED Final   Candida tropicalis NOT DETECTED NOT DETECTED Final  Blood culture (routine x 2)     Status: None (Preliminary result)   Collection Time: 12/29/15  5:13 PM  Result Value Ref Range Status   Specimen Description BLOOD LEFT FOREARM  Final   Special Requests BOTTLES DRAWN AEROBIC ONLY 5CC  Final   Culture NO GROWTH < 24 HOURS  Final   Report Status PENDING  Incomplete     Radiological Exams on Admission: Dg Chest 2 View  Result Date: 12/30/2015 CLINICAL DATA:  Patient with abnormal blood cultures.  Lethargy. EXAM: CHEST  2 VIEW COMPARISON:  Chest radiograph 12/29/2015. FINDINGS: Patient is rotated to the left. Stable cardiomegaly. Tortuosity of the thoracic aorta. No consolidative pulmonary opacities. No pleural effusion or pneumothorax. Minimal basilar heterogeneous opacities. Possible small bilateral pleural effusions. Thoracic spine degenerative changes. IMPRESSION: Cardiomegaly. Minimal atelectasis and small bilateral pleural effusions. Aortic atherosclerosis. Electronically Signed   By: Lovey Newcomer M.D.   On: 12/30/2015 17:01   Dg Chest 2 View  Result Date: 12/29/2015 CLINICAL DATA:  Cough.  Shaking at hands in legs after taking Aleve. EXAM: CHEST  2 VIEW COMPARISON:  One-view  chest x-ray 02/24/2014 FINDINGS: Cardiomegaly is stable. Atherosclerotic changes are noted at the aortic arch. Emphysematous changes are present. Small bilateral pleural effusions are noted. There is minimal bibasilar atelectasis. No other focal airspace disease is present. Surgical clips are present at the right axilla. IMPRESSION: 1. Cardiomegaly and mild bilateral pleural effusions. These are nonspecific. They could be related to congestive heart failure although no significant edema is present. 2. Atherosclerosis of the aorta. 3. Surgical clips are present at the right axilla compatible with previous breast surgery and axillary node dissection. Electronically Signed   By: San Morelle M.D.   On: 12/29/2015 16:26    EKG: Independently reviewed.  Assessment/Plan Principal Problem:   Bacteremia due to Escherichia coli Active Problems:   DM2 (diabetes mellitus, type 2) (H[facility])   Essential hypertension, benign   PAF (paroxysmal atrial fibrillation) ([facility])   Anticoagulated   UTI (urinary tract infection)    1. E.Coli bacteremia from UTI source - 1. Rocephin 2gm Q24H 2. Sensitivities pending 2. DM2 - continue home meds 3. HTN - continue home meds 4. PAF - continue home meds including eliquis   DVT prophylaxis: Eliquis Code Status: Full Family Communication: Family at bedside Consults called: None Admission status: Admit to inpatient   Etta Quill DO [facility] Pager [phone] from 7PM-7AM  If 7AM-7PM, please contact the day physician for the patient [email or web address] Password [email or web address]  12/30/2015, 11:14 PM

## 2015-12-30 NOTE — ED Triage Notes (Signed)
Pt states seen here yesterday. Pt was feeling ill. Blood cultures were taken, pt was sent home with levoquin. Pt received call today to return for abnormal blood cultures. Pharmacy also refused to fill levoquin due to conflict with amiodarone. Pt well feeling well appearing today. No new complaints.

## 2015-12-30 NOTE — Telephone Encounter (Signed)
Post ED Visit - Positive Culture Follow-up: Successful Patient Follow-Up  Culture assessed and recommendations reviewed by: []  Elenor Quinones, Pharm.D. []  Heide Guile, Pharm.D., BCPS []  Parks Neptune, Pharm.D. []  Alycia Rossetti, Pharm.D., BCPS []  [place], Pharm.D., BCPS, AAHIVP []  Legrand Como, Pharm.D., BCPS, AAHIVP []  Milus Glazier, Pharm.D. []  Stephens November, Pharm.D.  Positive blood culture  []  Patient discharged without antimicrobial prescription and treatment is now indicated []  Organism is resistant to prescribed ED discharge antimicrobial [x]  Patient with positive blood cultures  Changes discussed with ED provider: Monico Blitz PA   needs to return to ED for reeval, possible admission    Hazle Nordmann 12/30/2015, 1:34 PM

## 2015-12-30 NOTE — Discharge Planning (Signed)
Pt prescribed levofloxacin (LEVAQUIN) 500 MG tablet on 10/15.[facility] KitchenPharm D called stating there is an arrhthymia reaction with amiodarone. Please advise CM [phone].Thanks!

## 2015-12-30 NOTE — Telephone Encounter (Signed)
This pt is not an [facility] pt, dr Wynetta Emery is doing an ED rotation Gave [facility] info to call ED and as back up dr Durenda Age pager

## 2015-12-31 ENCOUNTER — Encounter (HOSPITAL_COMMUNITY): Payer: Self-pay | Admitting: *Deleted

## 2015-12-31 DIAGNOSIS — N179 Acute kidney failure, unspecified: Secondary | ICD-10-CM

## 2015-12-31 DIAGNOSIS — E118 Type 2 diabetes mellitus with unspecified complications: Secondary | ICD-10-CM

## 2015-12-31 DIAGNOSIS — I272 Pulmonary hypertension, unspecified: Secondary | ICD-10-CM

## 2015-12-31 DIAGNOSIS — I480 Paroxysmal atrial fibrillation: Secondary | ICD-10-CM

## 2015-12-31 DIAGNOSIS — N182 Chronic kidney disease, stage 2 (mild): Secondary | ICD-10-CM

## 2015-12-31 DIAGNOSIS — R748 Abnormal levels of other serum enzymes: Secondary | ICD-10-CM

## 2015-12-31 DIAGNOSIS — I5032 Chronic diastolic (congestive) heart failure: Secondary | ICD-10-CM

## 2015-12-31 LAB — CBC WITH DIFFERENTIAL/PLATELET
Basophils Absolute: 0 10*3/uL (ref 0.0–0.1)
Basophils Relative: 1 %
Eosinophils Absolute: 0.1 10*3/uL (ref 0.0–0.7)
Eosinophils Relative: 1 %
HCT: 31 % — ABNORMAL LOW (ref 36.0–46.0)
Hemoglobin: 10.2 g/dL — ABNORMAL LOW (ref 12.0–15.0)
Lymphocytes Relative: 9 %
Lymphs Abs: 0.6 10*3/uL — ABNORMAL LOW (ref 0.7–4.0)
MCH: 30 pg (ref 26.0–34.0)
MCHC: 32.9 g/dL (ref 30.0–36.0)
MCV: 91.2 fL (ref 78.0–100.0)
Monocytes Absolute: 0.5 10*3/uL (ref 0.1–1.0)
Monocytes Relative: 8 %
Neutro Abs: 4.9 10*3/uL (ref 1.7–7.7)
Neutrophils Relative %: 81 %
Platelets: 114 10*3/uL — ABNORMAL LOW (ref 150–400)
RBC: 3.4 MIL/uL — ABNORMAL LOW (ref 3.87–5.11)
RDW: 15.7 % — ABNORMAL HIGH (ref 11.5–15.5)
WBC: 6 10*3/uL (ref 4.0–10.5)

## 2015-12-31 LAB — URINE CULTURE: Culture: NO GROWTH

## 2015-12-31 LAB — COMPREHENSIVE METABOLIC PANEL
ALT: 222 U/L — ABNORMAL HIGH (ref 14–54)
AST: 148 U/L — ABNORMAL HIGH (ref 15–41)
Albumin: 2.8 g/dL — ABNORMAL LOW (ref 3.5–5.0)
Alkaline Phosphatase: 50 U/L (ref 38–126)
Anion gap: 8 (ref 5–15)
BUN: 29 mg/dL — ABNORMAL HIGH (ref 6–20)
CO2: 25 mmol/L (ref 22–32)
Calcium: 8.7 mg/dL — ABNORMAL LOW (ref 8.9–10.3)
Chloride: 106 mmol/L (ref 101–111)
Creatinine, Ser: 1.69 mg/dL — ABNORMAL HIGH (ref 0.44–1.00)
GFR calc Af Amer: 31 mL/min — ABNORMAL LOW (ref >60)
GFR calc non Af Amer: 26 mL/min — ABNORMAL LOW (ref >60)
Glucose, Bld: 110 mg/dL — ABNORMAL HIGH (ref 65–99)
Potassium: 3.5 mmol/L (ref 3.5–5.1)
Sodium: 139 mmol/L (ref 135–145)
Total Bilirubin: 0.8 mg/dL (ref 0.3–1.2)
Total Protein: 5.6 g/dL — ABNORMAL LOW (ref 6.5–8.1)

## 2015-12-31 LAB — GLUCOSE, CAPILLARY
Glucose-Capillary: 65 mg/dL (ref 65–99)
Glucose-Capillary: 74 mg/dL (ref 65–99)
Glucose-Capillary: 69 mg/dL (ref 65–99)
Glucose-Capillary: 86 mg/dL (ref 65–99)
Glucose-Capillary: 94 mg/dL (ref 65–99)
Glucose-Capillary: 123 mg/dL — ABNORMAL HIGH (ref 65–99)

## 2015-12-31 LAB — MAGNESIUM: Magnesium: 1.8 mg/dL (ref 1.7–2.4)

## 2015-12-31 MED ORDER — SODIUM CHLORIDE 0.9 % IV SOLN
INTRAVENOUS | Status: DC
  Administered 2015-12-31: 15:00:00 via INTRAVENOUS

## 2015-12-31 NOTE — Progress Notes (Signed)
PROGRESS NOTE    SUMAIYAH GUT  [id number] DOB: 04-03-1927 DOA: 12/30/2015 PCP: Odette Fraction, MD   Brief Narrative:   ELLEANOR ZYWICKI is a 82 y.o. BF PMHx Chronic Diastolic CHF,Pulmonary Hypertension, A.Fib, on Eliquis, DM type 2 controlled with, complications, CKD, HTN, HLD,. Right Breast cancer S/P XRT, Multinodular Thyroid goiter,, ,  Patient returns to ED after being called with positive BCx.  She was here yesterday due to not feeling well with c/o fever, chills.  BCx taken yesterday which have now come back 1/2 positive for E.Coli.  She was discharged on levaquin yesterday which pharmacy wouldn't fill due to interaction with Amiodarone which she also takes.  Today she has actually felt well, much better than yesterday and has no complaints.   Subjective: 10/17 A/O 4, NAD,   Assessment & Plan:   Principal Problem:   Bacteremia due to Escherichia coli Active Problems:   DM2 (diabetes mellitus, type 2) (HCC)   Essential hypertension, benign   PAF (paroxysmal atrial fibrillation) (HCC)   Anticoagulated   UTI (urinary tract infection)   Controlled diabetes mellitus type 2 with complications (HCC)   Paroxysmal atrial fibrillation (HCC)   Chronic diastolic CHF (congestive heart failure) (HCC)   Pulmonary hypertension   Acute renal failure superimposed on stage 2 chronic kidney disease (HCC)   Elevated liver enzymes   positive Enterobacteriaceae species/Escherichia coli Bacteremia -Continue current antibiotics: When sensitivities returned change to appropriate PO medication and discharge patient. -Sensitivities pending  Diabetes type 2 controlled with complications -7/3 Hemoglobin A1c= 5.3 -Amaryl 0.5 mg BID -Linagliptin (Tradjenta) 5 mg daily  HTN -Amiodarone 200 mg daily -Amlodipine 5 mg daily -Toprolol 100 mg BID -Lisinopril 20 mg daily  Paroxysmal atrial fibrillation -See HTN -Eliquis 5 mg BID  Chronic Diastolic CHF -See hypertension -Hold  Lasix -Strict in and out -Daily weight  Pulmonary hypertension -See CHF  Acute on Chronic Renal Failure  -Gentle hydration, normal saline 67ml/hr Lab Results  Component Value Date   CREATININE 1.69 (H) 12/31/2015   CREATININE 1.97 (H) 12/30/2015   CREATININE 1.27 (H) 12/29/2015    Elevated liver enzymes -Acute liver enzyme elevation, starting to trend down however may have to DC Amiodarone, as this could cause/exacerbate. -Obtain acute hepatitis panel for thoroughness    DVT prophylaxis: Eliquis Code Status: Full Family Communication: None Disposition Plan: When patient able to take PO medication for bacteremia, and elevated liver enzymes resolved   Consultants:  None    Procedures/Significant Events:  None   Cultures 10/15 blood left forearm/right AC positive Enterobacteriaceae species/Escherichia coli  10/16 urine pending 10/16 blood pending   Antimicrobials: Ceftriaxone 10/16>>   Devices None   LINES / TUBES:  None    Continuous Infusions: . sodium chloride 50 mL/hr at 12/31/15 1510     Objective: Vitals:   12/31/15 0209 12/31/15 0635 12/31/15 0936 12/31/15 1657  BP:  (!) 134/52 (!) 134/55 (!) 147/51  Pulse:  (!) 56 (!) 54 (!) 50  Resp:  18 18 18   Temp:  98.5 F (36.9 C) 98.6 F (37 C) 98.7 F (37.1 C)  TempSrc:   Oral Oral  SpO2:  100% 98% 98%  Weight: 86.9 kg (191 lb 8 oz)     Height: 5\' 7"  (1.702 m)       Intake/Output Summary (Last 24 hours) at 12/31/15 1731 Last data filed at 12/31/15 1658  Gross per 24 hour  Intake  650 ml  Output             1100 ml  Net             -450 ml   Filed Weights   12/31/15 0209  Weight: 86.9 kg (191 lb 8 oz)    Examination:  General: A/O 4, NAD,No acute respiratory distress Eyes: negative scleral hemorrhage, negative anisocoria, negative icterus ENT: Negative Runny nose, negative gingival bleeding, Neck:  Negative scars, masses, torticollis, lymphadenopathy, JVD Lungs:  Clear to auscultation bilaterally without wheezes or crackles Cardiovascular: Regular rate and rhythm without murmur gallop or rub normal S1 and S2 Abdomen: negative abdominal pain, nondistended, positive soft, bowel sounds, no rebound, no ascites, no appreciable mass Extremities: No significant cyanosis, clubbing, or edema bilateral lower extremities Skin: Negative rashes, lesions, ulcers Psychiatric:  Negative depression, negative anxiety, negative fatigue, negative mania  Central nervous system:  Cranial nerves II through XII intact, tongue/uvula midline, all extremities muscle strength 5/5, sensation intact throughout, negative dysarthria, negative expressive aphasia, negative receptive aphasia.  .     Data Reviewed: Care during the described time interval was provided by me .  I have reviewed this patient's available data, including medical history, events of note, physical examination, and all test results as part of my evaluation. I have personally reviewed and interpreted all radiology studies.  CBC:  Recent Labs Lab 12/29/15 1136 12/30/15 1636 12/31/15 0938  WBC 9.5 8.2 6.0  NEUTROABS 9.1* 7.1 4.9  HGB 10.1* 10.8* 10.2*  HCT 30.9* 32.9* 31.0*  MCV 91.4 90.6 91.2  PLT 101* 116* [id number]*   Basic Metabolic Panel:  Recent Labs Lab 12/29/15 1136 12/30/15 1636 12/31/15 0938  NA 137 138 139  K 3.4* 3.7 3.5  CL 102 103 106  CO2 26 28 25   GLUCOSE 224* 85 110*  BUN 18 31* 29*  CREATININE 1.27* 1.97* 1.69*  CALCIUM 8.8* 9.3 8.7*  MG  --   --  1.8   GFR: Estimated Creatinine Clearance: 27.5 mL/min (by C-G formula based on SCr of 1.69 mg/dL (H)). Liver Function Tests:  Recent Labs Lab 12/29/15 1136 12/30/15 1636 12/31/15 0938  AST 68* 262* 148*  ALT 93* 293* 222*  ALKPHOS 51 52 50  BILITOT 0.8 0.6 0.8  PROT 5.5* 5.8* 5.6*  ALBUMIN 3.0* 3.1* 2.8*   No results for input(s): LIPASE, AMYLASE in the last 168 hours. No results for input(s): AMMONIA in the last 168  hours. Coagulation Profile: No results for input(s): INR, PROTIME in the last 168 hours. Cardiac Enzymes: No results for input(s): CKTOTAL, CKMB, CKMBINDEX, TROPONINI in the last 168 hours. BNP (last 3 results) No results for input(s): PROBNP in the last 8760 hours. HbA1C: No results for input(s): HGBA1C in the last 72 hours. CBG:  Recent Labs Lab 12/31/15 0204 12/31/15 0246 12/31/15 0719 12/31/15 1153 12/31/15 1654  GLUCAP 65 74 69 86 94   Lipid Profile: No results for input(s): CHOL, HDL, LDLCALC, TRIG, CHOLHDL, LDLDIRECT in the last 72 hours. Thyroid Function Tests: No results for input(s): TSH, T4TOTAL, FREET4, T3FREE, THYROIDAB in the last 72 hours. Anemia Panel: No results for input(s): VITAMINB12, FOLATE, FERRITIN, TIBC, IRON, RETICCTPCT in the last 72 hours. Urine analysis:    Component Value Date/Time   COLORURINE AMBER (A) 12/30/2015 1709   APPEARANCEUR CLOUDY (A) 12/30/2015 1709   LABSPEC 1.014 12/30/2015 1709   PHURINE 5.0 12/30/2015 1709   GLUCOSEU NEGATIVE 12/30/2015 1709   GLUCOSEU NEGATIVE 11/24/2012 0831   HGBUR  MODERATE (A) 12/30/2015 1709   BILIRUBINUR NEGATIVE 12/30/2015 1709   BILIRUBINUR neg 09/19/2014 [place] 12/30/2015 1709   PROTEINUR NEGATIVE 12/30/2015 1709   UROBILINOGEN 0.2 09/19/2014 1605   UROBILINOGEN 0.2 02/24/2014 0024   NITRITE NEGATIVE 12/30/2015 1709   LEUKOCYTESUR MODERATE (A) 12/30/2015 1709   Sepsis Labs: @LABRCNTIP (procalcitonin:4,lacticidven:4)  ) Recent Results (from the past 240 hour(s))  Blood culture (routine x 2)     Status: None (Preliminary result)   Collection Time: 12/29/15  5:05 PM  Result Value Ref Range Status   Specimen Description BLOOD RIGHT ANTECUBITAL  Final   Special Requests BOTTLES DRAWN AEROBIC AND ANAEROBIC 5CC  Final   Culture  Setup Time   Final    GRAM NEGATIVE RODS AEROBIC BOTTLE ONLY Organism ID to follow CRITICAL RESULT CALLED TO, READ BACK BY AND VERIFIED WITH: Garlon Hatchet  RN 13:10 12/30/15 ([email or web address])    Culture GRAM NEGATIVE RODS  Final   Report Status PENDING  Incomplete  Blood Culture ID Panel (Reflexed)     Status: Abnormal   Collection Time: 12/29/15  5:05 PM  Result Value Ref Range Status   Enterococcus species NOT DETECTED NOT DETECTED Final   Listeria monocytogenes NOT DETECTED NOT DETECTED Final   Staphylococcus species NOT DETECTED NOT DETECTED Final   Staphylococcus aureus NOT DETECTED NOT DETECTED Final   Streptococcus species NOT DETECTED NOT DETECTED Final   Streptococcus agalactiae NOT DETECTED NOT DETECTED Final   Streptococcus pneumoniae NOT DETECTED NOT DETECTED Final   Streptococcus pyogenes NOT DETECTED NOT DETECTED Final   Acinetobacter baumannii NOT DETECTED NOT DETECTED Final   Enterobacteriaceae species DETECTED (A) NOT DETECTED Final    Comment: CRITICAL RESULT CALLED TO, READ BACK BY AND VERIFIED WITH: Garlon Hatchet RN 13:10 12/30/15 ([email or web address])    Enterobacter cloacae complex NOT DETECTED NOT DETECTED Final   Escherichia coli DETECTED (A) NOT DETECTED Final    Comment: CRITICAL RESULT CALLED TO, READ BACK BY AND VERIFIED WITH: Garlon Hatchet RN 13:10 12/30/15 ([email or web address])    Klebsiella oxytoca NOT DETECTED NOT DETECTED Final   Klebsiella pneumoniae NOT DETECTED NOT DETECTED Final   Proteus species NOT DETECTED NOT DETECTED Final   Serratia marcescens NOT DETECTED NOT DETECTED Final   Carbapenem resistance NOT DETECTED NOT DETECTED Final   Haemophilus influenzae NOT DETECTED NOT DETECTED Final   Neisseria meningitidis NOT DETECTED NOT DETECTED Final   Pseudomonas aeruginosa NOT DETECTED NOT DETECTED Final   Candida albicans NOT DETECTED NOT DETECTED Final   Candida glabrata NOT DETECTED NOT DETECTED Final   Candida krusei NOT DETECTED NOT DETECTED Final   Candida parapsilosis NOT DETECTED NOT DETECTED Final   Candida tropicalis NOT DETECTED NOT DETECTED Final  Blood culture (routine x 2)     Status: None (Preliminary result)    Collection Time: 12/29/15  5:13 PM  Result Value Ref Range Status   Specimen Description BLOOD LEFT FOREARM  Final   Special Requests BOTTLES DRAWN AEROBIC ONLY 5CC  Final   Culture NO GROWTH 2 DAYS  Final   Report Status PENDING  Incomplete  Urine culture     Status: None   Collection Time: 12/30/15  3:34 PM  Result Value Ref Range Status   Specimen Description URINE, RANDOM  Final   Special Requests NONE  Final   Culture NO GROWTH 1 DAY  Final   Report Status 12/31/2015 FINAL  Final  Blood culture (routine x 2)     Status: None (Preliminary result)  Collection Time: 12/30/15  4:36 PM  Result Value Ref Range Status   Specimen Description BLOOD LEFT ANTECUBITAL  Final   Special Requests BOTTLES DRAWN AEROBIC AND ANAEROBIC 5CC  Final   Culture NO GROWTH < 24 HOURS  Final   Report Status PENDING  Incomplete         Radiology Studies: Dg Chest 2 View  Result Date: 12/30/2015 CLINICAL DATA:  Patient with abnormal blood cultures.  Lethargy. EXAM: CHEST  2 VIEW COMPARISON:  Chest radiograph 12/29/2015. FINDINGS: Patient is rotated to the left. Stable cardiomegaly. Tortuosity of the thoracic aorta. No consolidative pulmonary opacities. No pleural effusion or pneumothorax. Minimal basilar heterogeneous opacities. Possible small bilateral pleural effusions. Thoracic spine degenerative changes. IMPRESSION: Cardiomegaly. Minimal atelectasis and small bilateral pleural effusions. Aortic atherosclerosis. Electronically Signed   By: Lovey Newcomer M.D.   On: 12/30/2015 17:01        Scheduled Meds: . amiodarone  200 mg Oral Daily  . amLODipine  5 mg Oral Daily  . apixaban  5 mg Oral BID  . atorvastatin  10 mg Oral Daily  . cefTRIAXone (ROCEPHIN)  IV  2 g Intravenous QHS  . doxazosin  4 mg Oral Daily  . ferrous sulfate  325 mg Oral Q breakfast  . glimepiride  0.5 mg Oral BID WC  . linagliptin  5 mg Oral Daily  . lisinopril  20 mg Oral Daily  . metoprolol succinate  100 mg Oral BID PC    . potassium chloride SA  20 mEq Oral Daily  . tamoxifen  20 mg Oral Daily  . vitamin B-12  1,000 mcg Oral Daily   Continuous Infusions: . sodium chloride 50 mL/hr at 12/31/15 1510     LOS: 1 day    Time spent: 40 minutes    Rainer Mounce, Geraldo Docker, MD [facility] Pager [phone]   If 7PM-7AM, please contact night-coverage [email or web address] Password [email or web address] 12/31/2015, 5:31 PM

## 2015-12-31 NOTE — ED Notes (Signed)
Report called to 6E.  Patient stable to be transferred at this time.  No signs of distress noted.  Belongings all taken with the patient.

## 2015-12-31 NOTE — Discharge Instructions (Signed)
Information on my medicine - ELIQUIS (apixaban) (you were prescribed eliquis prior to this hospital admission)    This medication education was reviewed with me or my healthcare representative as part of my discharge preparation.  Why was Eliquis prescribed for you? (you were prescribed eliquis prior to this hospital admission)  Eliquis was prescribed for you to reduce the risk of a blood clot forming that can cause a stroke if you have a medical condition called atrial fibrillation (a type of irregular heartbeat).  What do You need to know about Eliquis ? Take your Eliquis TWICE DAILY - one tablet in the morning and one tablet in the evening with or without food. If you have difficulty swallowing the tablet whole please discuss with your pharmacist how to take the medication safely.  Take Eliquis exactly as prescribed by your doctor and DO NOT stop taking Eliquis without talking to the doctor who prescribed the medication.  Stopping may increase your risk of developing a stroke.  Refill your prescription before you run out.  After discharge, you should have regular check-up appointments with your healthcare provider that is prescribing your Eliquis.  In the future your dose may need to be changed if your kidney function or weight changes by a significant amount or as you get older.  What do you do if you miss a dose? If you miss a dose, take it as soon as you remember on the same day and resume taking twice daily.  Do not take more than one dose of ELIQUIS at the same time to make up a missed dose.  Important Safety Information A possible side effect of Eliquis is bleeding. You should call your healthcare provider right away if you experience any of the following: ? Bleeding from an injury or your nose that does not stop. ? Unusual colored urine (red or dark brown) or unusual colored stools (red or black). ? Unusual bruising for unknown reasons. ? A serious fall or if you hit your  head (even if there is no bleeding).  Some medicines may interact with Eliquis and might increase your risk of bleeding or clotting while on Eliquis. To help avoid this, consult your healthcare provider or pharmacist prior to using any new prescription or non-prescription medications, including herbals, vitamins, non-steroidal anti-inflammatory drugs (NSAIDs) and supplements.  This website has more information on Eliquis (apixaban): http://www.eliquis.com/eliquis/home

## 2016-01-01 ENCOUNTER — Inpatient Hospital Stay (HOSPITAL_COMMUNITY): Payer: Medicare Other

## 2016-01-01 ENCOUNTER — Encounter (HOSPITAL_COMMUNITY): Payer: Self-pay | Admitting: *Deleted

## 2016-01-01 DIAGNOSIS — R7881 Bacteremia: Principal | ICD-10-CM

## 2016-01-01 DIAGNOSIS — E118 Type 2 diabetes mellitus with unspecified complications: Secondary | ICD-10-CM

## 2016-01-01 DIAGNOSIS — R748 Abnormal levels of other serum enzymes: Secondary | ICD-10-CM

## 2016-01-01 DIAGNOSIS — I5032 Chronic diastolic (congestive) heart failure: Secondary | ICD-10-CM

## 2016-01-01 DIAGNOSIS — N182 Chronic kidney disease, stage 2 (mild): Secondary | ICD-10-CM

## 2016-01-01 DIAGNOSIS — N179 Acute kidney failure, unspecified: Secondary | ICD-10-CM

## 2016-01-01 LAB — HEPATITIS PANEL, ACUTE
HCV Ab: <0.1 s/co ratio (ref 0.0–0.9)
Hep A IgM: NEGATIVE
Hep B C IgM: NEGATIVE
Hepatitis B Surface Ag: NEGATIVE

## 2016-01-01 LAB — GLUCOSE, CAPILLARY
Glucose-Capillary: 88 mg/dL (ref 65–99)
Glucose-Capillary: 114 mg/dL — ABNORMAL HIGH (ref 65–99)
Glucose-Capillary: 119 mg/dL — ABNORMAL HIGH (ref 65–99)
Glucose-Capillary: 120 mg/dL — ABNORMAL HIGH (ref 65–99)

## 2016-01-01 LAB — CULTURE, BLOOD (ROUTINE X 2)

## 2016-01-01 MED ORDER — IOPAMIDOL (ISOVUE-300) INJECTION 61%
15.0000 mL | INTRAVENOUS | Status: AC
  Administered 2016-01-01 (×2): 15 mL via ORAL

## 2016-01-01 MED ORDER — INSULIN ASPART 100 UNIT/ML ~~LOC~~ SOLN: 0.0000 [IU] | Freq: Three times a day (TID) | SUBCUTANEOUS | Status: DC

## 2016-01-01 NOTE — Progress Notes (Signed)
PROGRESS NOTE        PATIENT DETAILS Name: Whitney [name] Age: 82 y.o. Sex: female Date of Birth: Jul 23, 1927 Admit Date: 12/30/2015 Admitting Physician Etta Quill, DO LK:[id number] TOM, MD  Brief Narrative: Patient is a 82 y.o. female with history of atrial fibrillation on anticoagulation, hypertension, diabetes who presented to the hospital on 10/15 with body aches and chills, she was thought to have pneumonia and discharged home on levofloxacin. On 10/16 her blood cultures was positive for gram-negative rod, she was asked to come to the hospital and was subsequently admitted to the hospitalist service.  Subjective: Feels much better-no further body aches or chills.  No vomiting No diarrhea No abdominal pain/RUQ pain  Assessment/Plan: Principal Problem: Bacteremia due to Escherichia coli: Continue empiric Rocephin, wIll transition to oral antimicrobials on discharge. Although suspected to have UTI causing bacteremia, given elevated LFTs-we will order a CT scan of the abdomen to make sure no hepatobiliary/GI etiology  Active Problems: Elevated liver enzymes: Probably due to bacteremia-await CT scan of the abdomen to make sure no obvious biliary sludge GI pathology causing bacteremia. Cautiously continue with amiodarone for now-as LFTs down trending. Hepatitis serology negative. Follow.  Acute on chronic kidney disease stage III: Mild ARF likely hemodynamically mediated-continue supportive care and follow electrolytes. Creatinine now downtrending.  Type 2 diabetes: CBGs stable, add SSI, continue Amaryl. A1c 5.3 on 7/3  Hypertension: Controlled, continue amlodipine,cardura,metoprolol and lisinopril  Atrial fibrillation: Rate controlled with amiodarone and metoprolol. Continue Eliquis-CHA2DS2VASC score of 5.Repeat EKG today  Chronic diastolic heart failure: Clinically compensated, resume diuretics on discharge  History of breast cancer:  Followed at the cancer center, continue tamoxifen  DVT Prophylaxis: Full dose anticoagulation with Eliquis  Code Status: Full code   Family Communication: None at bedside  Disposition Plan: Remain inpatient-home 10/19  Antimicrobial agents: See below  Procedures: None  CONSULTS:  None  Time spent: 25 minutes-Greater than 50% of this time was spent in counseling, explanation of diagnosis, planning of further management, and coordination of care.  MEDICATIONS: Anti-infectives    Start     Dose/Rate Route Frequency Ordered Stop   12/31/15 1000  levofloxacin (LEVAQUIN) tablet 500 mg  Status:  Discontinued    Comments:  Levaquin 500 mg po q48h for CrCl < 30 mL/min   500 mg Oral Every 48 hours 12/30/15 2241 12/31/15 0211   12/30/15 2300  cefTRIAXone (ROCEPHIN) 2 g in dextrose 5 % 50 mL IVPB     2 g 100 mL/hr over 30 Minutes Intravenous Daily at bedtime 12/30/15 2233     12/30/15 2245  cefTRIAXone (ROCEPHIN) 1 g in dextrose 5 % 50 mL IVPB  Status:  Discontinued     1 g 100 mL/hr over 30 Minutes Intravenous  Once 12/30/15 2233 12/30/15 2238   12/30/15 2145  cefTRIAXone (ROCEPHIN) 1 g in dextrose 5 % 50 mL IVPB  Status:  Discontinued     1 g 100 mL/hr over 30 Minutes Intravenous  Once 12/30/15 2143 12/30/15 2239      Scheduled Meds: . amiodarone  200 mg Oral Daily  . amLODipine  5 mg Oral Daily  . apixaban  5 mg Oral BID  . atorvastatin  10 mg Oral Daily  . cefTRIAXone (ROCEPHIN)  IV  2 g Intravenous QHS  . doxazosin  4 mg Oral Daily  . ferrous  sulfate  325 mg Oral Q breakfast  . glimepiride  0.5 mg Oral BID WC  . linagliptin  5 mg Oral Daily  . lisinopril  20 mg Oral Daily  . metoprolol succinate  100 mg Oral BID PC  . potassium chloride SA  20 mEq Oral Daily  . tamoxifen  20 mg Oral Daily  . vitamin B-12  1,000 mcg Oral Daily   Continuous Infusions:  PRN Meds:.senna   PHYSICAL EXAM: Vital signs: Vitals:   12/31/15 1657 12/31/15 2125 01/01/16 0617 01/01/16  1026  BP: (!) 147/51 (!) 141/65 (!) 145/58 (!) 145/62  Pulse: (!) 50 (!) 57 (!) 56 (!) 52  Resp: 18 17 17 18   Temp: 98.7 F (37.1 C) 98.7 F (37.1 C) 98.3 F (36.8 C) 97.8 F (36.6 C)  TempSrc: Oral Oral Oral Oral  SpO2: 98% 99% 99% 98%  Weight:  86.2 kg (190 lb)    Height:       Filed Weights   12/31/15 0209 12/31/15 2125  Weight: 86.9 kg (191 lb 8 oz) 86.2 kg (190 lb)   Body mass index is 29.76 kg/m.   General appearance :Awake, alert, not in any distress. Speech Clear. Not toxic Looking Eyes:, pupils equally reactive to light and accomodation,no scleral icterus.Pink conjunctiva HEENT: Atraumatic and Normocephalic Neck: supple, no JVD. No cervical lymphadenopathy. No thyromegaly Resp:Good air entry bilaterally, no added sounds  CVS: S1 S2 regular, no murmurs.  GI: Bowel sounds present, Non tender and not distended with no gaurding, rigidity or rebound.No organomegaly Extremities: B/L Lower Ext shows no edema, both legs are warm to touch Neurology:  speech clear,Non focal, sensation is grossly intact. Psychiatric: Normal judgment and insight. Alert and oriented x 3. Normal mood. Musculoskeletal:gait appears to be normal.No digital cyanosis Skin:No Rash, warm and dry Wounds:N/A  I have personally reviewed following labs and imaging studies  LABORATORY DATA: CBC:  Recent Labs Lab 12/29/15 1136 12/30/15 1636 12/31/15 0938  WBC 9.5 8.2 6.0  NEUTROABS 9.1* 7.1 4.9  HGB 10.1* 10.8* 10.2*  HCT 30.9* 32.9* 31.0*  MCV 91.4 90.6 91.2  PLT 101* 116* 114*    Basic Metabolic Panel:  Recent Labs Lab 12/29/15 1136 12/30/15 1636 12/31/15 0938  NA 137 138 139  K 3.4* 3.7 3.5  CL 102 103 106  CO2 26 28 25   GLUCOSE 224* 85 110*  BUN 18 31* 29*  CREATININE 1.27* 1.97* 1.69*  CALCIUM 8.8* 9.3 8.7*  MG  --   --  1.8    GFR: Estimated Creatinine Clearance: 27.4 mL/min (by C-G formula based on SCr of 1.69 mg/dL (H)).  Liver Function Tests:  Recent Labs Lab  12/29/15 1136 12/30/15 1636 12/31/15 0938  AST 68* 262* 148*  ALT 93* 293* 222*  ALKPHOS 51 52 50  BILITOT 0.8 0.6 0.8  PROT 5.5* 5.8* 5.6*  ALBUMIN 3.0* 3.1* 2.8*   No results for input(s): LIPASE, AMYLASE in the last 168 hours. No results for input(s): AMMONIA in the last 168 hours.  Coagulation Profile: No results for input(s): INR, PROTIME in the last 168 hours.  Cardiac Enzymes: No results for input(s): CKTOTAL, CKMB, CKMBINDEX, TROPONINI in the last 168 hours.  BNP (last 3 results) No results for input(s): PROBNP in the last 8760 hours.  HbA1C: No results for input(s): HGBA1C in the last 72 hours.  CBG:  Recent Labs Lab 12/31/15 1153 12/31/15 1654 12/31/15 2216 01/01/16 0732 01/01/16 1211  GLUCAP 86 94 123* 88 114*  Lipid Profile: No results for input(s): CHOL, HDL, LDLCALC, TRIG, CHOLHDL, LDLDIRECT in the last 72 hours.  Thyroid Function Tests: No results for input(s): TSH, T4TOTAL, FREET4, T3FREE, THYROIDAB in the last 72 hours.  Anemia Panel: No results for input(s): VITAMINB12, FOLATE, FERRITIN, TIBC, IRON, RETICCTPCT in the last 72 hours.  Urine analysis:    Component Value Date/Time   COLORURINE AMBER (A) 12/30/2015 1709   APPEARANCEUR CLOUDY (A) 12/30/2015 1709   LABSPEC 1.014 12/30/2015 1709   PHURINE 5.0 12/30/2015 1709   GLUCOSEU NEGATIVE 12/30/2015 1709   GLUCOSEU NEGATIVE 11/24/2012 0831   HGBUR MODERATE (A) 12/30/2015 1709   BILIRUBINUR NEGATIVE 12/30/2015 1709   BILIRUBINUR neg 09/19/2014 [place] 12/30/2015 1709   PROTEINUR NEGATIVE 12/30/2015 1709   UROBILINOGEN 0.2 09/19/2014 1605   UROBILINOGEN 0.2 02/24/2014 0024   NITRITE NEGATIVE 12/30/2015 1709   LEUKOCYTESUR MODERATE (A) 12/30/2015 1709    Sepsis Labs: Lactic Acid, Venous    Component Value Date/Time   LATICACIDVEN 1.62 12/29/2015 1150    MICROBIOLOGY: Recent Results (from the past 240 hour(s))  Blood culture (routine x 2)     Status: Abnormal     Collection Time: 12/29/15  5:05 PM  Result Value Ref Range Status   Specimen Description BLOOD RIGHT ANTECUBITAL  Final   Special Requests BOTTLES DRAWN AEROBIC AND ANAEROBIC 5CC  Final   Culture  Setup Time   Final    GRAM NEGATIVE RODS AEROBIC BOTTLE ONLY CRITICAL RESULT CALLED TO, READ BACK BY AND VERIFIED WITH: Garlon Hatchet RN 13:10 12/30/15 ([email or web address])    Culture ESCHERICHIA COLI (A)  Final   Report Status 01/01/2016 FINAL  Final   Organism ID, Bacteria ESCHERICHIA COLI  Final      Susceptibility   Escherichia coli - MIC*    AMPICILLIN <=2 SENSITIVE Sensitive     CEFAZOLIN <=4 SENSITIVE Sensitive     CEFEPIME <=1 SENSITIVE Sensitive     CEFTAZIDIME <=1 SENSITIVE Sensitive     CEFTRIAXONE <=1 SENSITIVE Sensitive     CIPROFLOXACIN <=0.25 SENSITIVE Sensitive     GENTAMICIN <=1 SENSITIVE Sensitive     IMIPENEM <=0.25 SENSITIVE Sensitive     TRIMETH/SULFA <=20 SENSITIVE Sensitive     AMPICILLIN/SULBACTAM <=2 SENSITIVE Sensitive     PIP/TAZO <=4 SENSITIVE Sensitive     Extended ESBL NEGATIVE Sensitive     * ESCHERICHIA COLI  Blood Culture ID Panel (Reflexed)     Status: Abnormal   Collection Time: 12/29/15  5:05 PM  Result Value Ref Range Status   Enterococcus species NOT DETECTED NOT DETECTED Final   Listeria monocytogenes NOT DETECTED NOT DETECTED Final   Staphylococcus species NOT DETECTED NOT DETECTED Final   Staphylococcus aureus NOT DETECTED NOT DETECTED Final   Streptococcus species NOT DETECTED NOT DETECTED Final   Streptococcus agalactiae NOT DETECTED NOT DETECTED Final   Streptococcus pneumoniae NOT DETECTED NOT DETECTED Final   Streptococcus pyogenes NOT DETECTED NOT DETECTED Final   Acinetobacter baumannii NOT DETECTED NOT DETECTED Final   Enterobacteriaceae species DETECTED (A) NOT DETECTED Final    Comment: CRITICAL RESULT CALLED TO, READ BACK BY AND VERIFIED WITH: Garlon Hatchet RN 13:10 12/30/15 ([email or web address])    Enterobacter cloacae complex NOT DETECTED NOT DETECTED  Final   Escherichia coli DETECTED (A) NOT DETECTED Final    Comment: CRITICAL RESULT CALLED TO, READ BACK BY AND VERIFIED WITH: Garlon Hatchet RN 13:10 12/30/15 ([email or web address])    Klebsiella oxytoca NOT DETECTED NOT DETECTED Final   Klebsiella  pneumoniae NOT DETECTED NOT DETECTED Final   Proteus species NOT DETECTED NOT DETECTED Final   Serratia marcescens NOT DETECTED NOT DETECTED Final   Carbapenem resistance NOT DETECTED NOT DETECTED Final   Haemophilus influenzae NOT DETECTED NOT DETECTED Final   Neisseria meningitidis NOT DETECTED NOT DETECTED Final   Pseudomonas aeruginosa NOT DETECTED NOT DETECTED Final   Candida albicans NOT DETECTED NOT DETECTED Final   Candida glabrata NOT DETECTED NOT DETECTED Final   Candida krusei NOT DETECTED NOT DETECTED Final   Candida parapsilosis NOT DETECTED NOT DETECTED Final   Candida tropicalis NOT DETECTED NOT DETECTED Final  Blood culture (routine x 2)     Status: None (Preliminary result)   Collection Time: 12/29/15  5:13 PM  Result Value Ref Range Status   Specimen Description BLOOD LEFT FOREARM  Final   Special Requests BOTTLES DRAWN AEROBIC ONLY 5CC  Final   Culture NO GROWTH 2 DAYS  Final   Report Status PENDING  Incomplete  Urine culture     Status: None   Collection Time: 12/30/15  3:34 PM  Result Value Ref Range Status   Specimen Description URINE, RANDOM  Final   Special Requests NONE  Final   Culture NO GROWTH 1 DAY  Final   Report Status 12/31/2015 FINAL  Final  Blood culture (routine x 2)     Status: None (Preliminary result)   Collection Time: 12/30/15  4:36 PM  Result Value Ref Range Status   Specimen Description BLOOD LEFT ANTECUBITAL  Final   Special Requests BOTTLES DRAWN AEROBIC AND ANAEROBIC 5CC  Final   Culture NO GROWTH < 24 HOURS  Final   Report Status PENDING  Incomplete    RADIOLOGY STUDIES/RESULTS: Dg Chest 2 View  Result Date: 12/30/2015 CLINICAL DATA:  Patient with abnormal blood cultures.  Lethargy. EXAM: CHEST  2  VIEW COMPARISON:  Chest radiograph 12/29/2015. FINDINGS: Patient is rotated to the left. Stable cardiomegaly. Tortuosity of the thoracic aorta. No consolidative pulmonary opacities. No pleural effusion or pneumothorax. Minimal basilar heterogeneous opacities. Possible small bilateral pleural effusions. Thoracic spine degenerative changes. IMPRESSION: Cardiomegaly. Minimal atelectasis and small bilateral pleural effusions. Aortic atherosclerosis. Electronically Signed   By: Lovey Newcomer M.D.   On: 12/30/2015 17:01   Dg Chest 2 View  Result Date: 12/29/2015 CLINICAL DATA:  Cough.  Shaking at hands in legs after taking Aleve. EXAM: CHEST  2 VIEW COMPARISON:  One-view chest x-ray 02/24/2014 FINDINGS: Cardiomegaly is stable. Atherosclerotic changes are noted at the aortic arch. Emphysematous changes are present. Small bilateral pleural effusions are noted. There is minimal bibasilar atelectasis. No other focal airspace disease is present. Surgical clips are present at the right axilla. IMPRESSION: 1. Cardiomegaly and mild bilateral pleural effusions. These are nonspecific. They could be related to congestive heart failure although no significant edema is present. 2. Atherosclerosis of the aorta. 3. Surgical clips are present at the right axilla compatible with previous breast surgery and axillary node dissection. Electronically Signed   By: San Morelle M.D.   On: 12/29/2015 16:26     LOS: 2 days   Oren Binet, MD  [facility] Pager:[phone]  If 7PM-7AM, please contact night-coverage [email or web address] Password [email or web address] 01/01/2016, 1:21 PM

## 2016-01-02 LAB — COMPREHENSIVE METABOLIC PANEL
ALT: 131 U/L — ABNORMAL HIGH (ref 14–54)
AST: 70 U/L — ABNORMAL HIGH (ref 15–41)
Albumin: 2.4 g/dL — ABNORMAL LOW (ref 3.5–5.0)
Alkaline Phosphatase: 42 U/L (ref 38–126)
Anion gap: 4 — ABNORMAL LOW (ref 5–15)
BUN: 21 mg/dL — ABNORMAL HIGH (ref 6–20)
CO2: 28 mmol/L (ref 22–32)
Calcium: 8.1 mg/dL — ABNORMAL LOW (ref 8.9–10.3)
Chloride: 109 mmol/L (ref 101–111)
Creatinine, Ser: 1.32 mg/dL — ABNORMAL HIGH (ref 0.44–1.00)
GFR calc Af Amer: 41 mL/min — ABNORMAL LOW (ref >60)
GFR calc non Af Amer: 36 mL/min — ABNORMAL LOW (ref >60)
Glucose, Bld: 75 mg/dL (ref 65–99)
Potassium: 3.8 mmol/L (ref 3.5–5.1)
Sodium: 141 mmol/L (ref 135–145)
Total Bilirubin: 0.3 mg/dL (ref 0.3–1.2)
Total Protein: 4.7 g/dL — ABNORMAL LOW (ref 6.5–8.1)

## 2016-01-02 LAB — GLUCOSE, CAPILLARY
Glucose-Capillary: 89 mg/dL (ref 65–99)
Glucose-Capillary: 185 mg/dL — ABNORMAL HIGH (ref 65–99)

## 2016-01-02 MED ORDER — AMOXICILLIN 500 MG PO CAPS: 500.0000 mg | ORAL_CAPSULE | Freq: Two times a day (BID) | ORAL | 0 refills | Status: DC

## 2016-01-02 NOTE — Evaluation (Signed)
Physical Therapy Evaluation & discharge Patient Details Name: Whitney [name] MRN: [id number] DOB: [birth year] Today's Date: 01/02/2016   History of Present Illness  Patient is a 82 y.o. female with history of atrial fibrillation on anticoagulation, hypertension, diabetes who presented to the hospital on 10/15 with body aches and chills, she was thought to have pneumonia and discharged home on levofloxacin. On 10/16 her blood cultures was positive for gram-negative rod, she was asked to come to the hospital.  Clinical Impression  Pt ambulating close to baseline level, but a slight decrease in balance due to fatigue.  Educated on using cane in the house and possibly even RW for community distances at home.  Pt in agreement.  Pt scheduled for d/c today and will sign off. Please re-order if pt's status changes.    Follow Up Recommendations No PT follow up    Equipment Recommendations  None recommended by PT    Recommendations for Other Services       Precautions / Restrictions Precautions Precautions: Fall Restrictions Weight Bearing Restrictions: No      Mobility  Bed Mobility               General bed mobility comments: standing EOB upon arrival  Transfers Overall transfer level: Modified independent                  Ambulation/Gait Ambulation/Gait assistance: Supervision Ambulation Distance (Feet): 240 Feet Assistive device:  (uses rail about 50% of the time) Gait Pattern/deviations: Drifts right/left;Step-through pattern Gait velocity: decreased   General Gait Details: Pt's balance fluctuated during session, but would use rail in hallway if needed. Pt reports this is how she walked prior to admission, but maybe a bit more tired due to being in bed  Stairs Stairs: Yes Stairs assistance: Supervision Stair Management: One rail Left;Alternating pattern Number of Stairs: 2 General stair comments: Held onto rail with B UE , but didn't go  sideways  Wheelchair Mobility    Modified Rankin (Stroke Patients Only)       Balance Overall balance assessment: Needs assistance   Sitting balance-Leahy Scale: Good       Standing balance-Leahy Scale: Fair                               Pertinent Vitals/Pain Pain Assessment: No/denies pain    Home Living Family/patient expects to be discharged to:: Private residence Living Arrangements: Other relatives Available Help at Discharge: Available PRN/intermittently Type of Home: House Home Access: Stairs to enter Entrance Stairs-Rails: Left Entrance Stairs-Number of Steps: 2 Home Layout: Two level Home Equipment: Walker - 2 wheels;Cane - single point      Prior Function Level of Independence: Independent         Comments: Carries her cane with her for community distances in case she needs it     Hand Dominance        Extremity/Trunk Assessment   Upper Extremity Assessment: Overall WFL for tasks assessed           Lower Extremity Assessment: Overall WFL for tasks assessed         Communication   Communication: No difficulties  Cognition Arousal/Alertness: Awake/alert Behavior During Therapy: WFL for tasks assessed/performed Overall Cognitive Status: Within Functional Limits for tasks assessed                      General Comments  Exercises     Assessment/Plan    PT Assessment Patent does not need any further PT services  PT Problem List            PT Treatment Interventions      PT Goals (Current goals can be found in the Care Plan section)  Acute Rehab PT Goals Patient Stated Goal: to go home today PT Goal Formulation: All assessment and education complete, DC therapy    Frequency     Barriers to discharge        Co-evaluation               End of Session Equipment Utilized During Treatment: Gait belt Activity Tolerance: Patient tolerated treatment well Patient left: in bed Nurse  Communication: Mobility status         Time: 1100-1116 PT Time Calculation (min) (ACUTE ONLY): 16 min   Charges:   PT Evaluation $PT Eval Low Complexity: 1 Procedure     PT G Codes:        Jeovanny Cuadros LUBECK 01/02/2016, 11:28 AM

## 2016-01-02 NOTE — Discharge Summary (Addendum)
PATIENT DETAILS Name: Whitney [name] Age: 82 y.o. Sex: female Date of Birth: [birth year] MRN: [id number]. Admitting Physician: Etta Quill, DO FZ:[id number] TOM, MD  Admit Date: 12/30/2015 Discharge date: 01/02/2016  Recommendations for Outpatient Follow-up:  1. Follow up with PCP in 1 weeks 2. Left adrenal mass incidentally found on CT abdomen-deferring workup to either endocrinology or PCP. 3. Had LFT elevation during this hospital stay, LFTs now downtrending, please repeat LFTs at next visit-patient is on amiodarone, statins.If LFTs still persistently elevated may need medication adjustments. 4. Please repeat blood culture to ensure resolution of bacteremia-once patient completes a course of antibiotics. 5. Please obtain CMP/CBC in one week  Admitted From:  Home  Disposition: [place]: No  Equipment/Devices: None  Discharge Condition: Stable  CODE STATUS: FULL CODE  Diet recommendation:  Heart Healthy / Carb Modified  Brief Summary: See H&P, Labs, Consult and Test reports for all details in brief, Patient is a 83 y.o. female with history of atrial fibrillation on anticoagulation, hypertension, diabetes who presented to the hospital on 10/15 with body aches and chills, she was thought to have pneumonia and discharged home on levofloxacin. On 10/16 her blood cultures was positive for gram-negative rod, she was asked to come to the hospital and was subsequently admitted to the hospitalist service.  Brief Hospital Course: Bacteremia due to Escherichia coli: She was placed on empiric Rocephin, blood cultures subsequently were positive for pansensitive Escherichia coli. CT of the abdomen was negative for any infective foci that could account for bacteremia-although had questionable diverticulitis (no abdominal pain or clinical features suggestive of diverticulitis). It is currently thought that she may have had a UTI that caused her to have bacteremia.  Although her LFTs were elevated-these are now starting to downtrend-furthermore no obvious hepatobiliary pathology seen on CT scan.Case was d/w Dr Baxter Flattery over the phone, recommendations are to d/c on Amoxicillin-to complete a 10-14 days course of Abx. ID of discharge she is doing much better, she is now afebrile -she is also no longer having any chills or generalized body aches.    Addendum 10/20 9:50 am Got a call from Dr Baxter Flattery yesterday evening, with recommendations to either start Cipro or Bactrim instead of Amoxicillin-since patient on Amiodarone-we decided to use Bactrim. Have called in Bactrim to her [facility]. Have spoken with patient and her grand daughter [place] over the phone today regarding the above. Also have instructed her to go to her PCP's office on 10/23 or 10/24 for a electrolyte check, and to hold potassium supplementation while on bactrim.   Active Problems: Elevated liver enzymes: Probably due to bacteremia-no obvious hepatobiliary pathology on CT scan. Since LFTs are down trending, plans are to cautiously continue with amiodarone and statins. Have asked patient to make sure her PCP repeat a complete metabolic panel at next visit. Please note, hepatitis serology was negative.   Acute on chronic kidney disease stage III: Mild ARF likely hemodynamically mediated-creatinine downtrending just supportive care. Please continue to follow electrolytes closely in the outpatient setting.  Type 2 diabetes: CBGs stable with SSI-resume oral hypoglycemic agents on discharge.  A1c 5.3 on 7/3  Hypertension: Controlled, continue amlodipine,cardura,metoprolol and lisinopril  Atrial fibrillation: Rate controlled with amiodarone and metoprolol. Continue Eliquis-CHA2DS2VASC score of 5.  Chronic diastolic heart failure: Clinically compensated, resume diuretics on discharge  History of breast cancer: Followed at the cancer center, continue tamoxifen  Left adrenal gland mass: Seen  incidentally on CT scan of the abdomen-deferring further  lab workup for pheochromocytoma/MRI etc. the outpatient setting. I have made an appointment with endocrinologist on 10/27 to see if she needs a workup for pheochromocytoma. I have relayed these findings to both the patient and to her daughter, both of them are agreeable with outpatient workup.  Procedures/Studies: None  Discharge Diagnoses:  Principal Problem:   Bacteremia due to Escherichia coli Active Problems:   DM2 (diabetes mellitus, type 2) (HCC)   Essential hypertension, benign   PAF (paroxysmal atrial fibrillation) (HCC)   Anticoagulated   UTI (urinary tract infection)   Controlled diabetes mellitus type 2 with complications (HCC)   Paroxysmal atrial fibrillation (HCC)   Chronic diastolic CHF (congestive heart failure) (HCC)   Pulmonary hypertension   Acute renal failure superimposed on stage 2 chronic kidney disease (HCC)   Elevated liver enzymes   Discharge Instructions:  Activity:  As tolerated with Full fall precautions use walker/cane & assistance as needed  Discharge Instructions    (HEART FAILURE PATIENTS) Call MD:  Anytime you have any of the following symptoms: 1) 3 pound weight gain in 24 hours or 5 pounds in 1 week 2) shortness of breath, with or without a dry hacking cough 3) swelling in the hands, feet or stomach 4) if you have to sleep on extra pillows at night in order to breathe.    Complete by:  As directed    Call MD for:  persistant nausea and vomiting    Complete by:  As directed    Call MD for:  temperature >100.4    Complete by:  As directed    Diet - low sodium heart healthy    Complete by:  As directed    Diet general    Complete by:  As directed    Discharge instructions    Complete by:  As directed    Follow with Primary MD  Jenna Luo TOM, MD  and Dr Gabriel Rung have a left adrenal mass-we are recommending that you get this worked up as an outpatient either with your  endocrinologist (Dr. Dwyane Dee) or with your primary care practitioner (Dr. Dennard Schaumann)  Please make sure you ask your primary care M.D. to check complete metabolic panel to make sure your LFTs have normalized.  Please make sure you ask your primary care M.D. to repeat blood cultures once we have completed antimicrobial therapy.  Get Medicines reviewed and adjusted: Please take all your medications with you for your next visit with your Primary MD  Laboratory/radiological data: Please request your Primary MD to go over all hospital tests and procedure/radiological results at the follow up, please ask your Primary MD to get all Hospital records sent to his/her office.  In some cases, they will be blood work, cultures and biopsy results pending at the time of your discharge. Please request that your primary care M.D. follows up on these results.  Also Note the following: If you experience worsening of your admission symptoms, develop shortness of breath, life threatening emergency, suicidal or homicidal thoughts you must seek medical attention immediately by calling 911 or calling your MD immediately  if symptoms less severe.  You must read complete instructions/literature along with all the possible adverse reactions/side effects for all the Medicines you take and that have been prescribed to you. Take any new Medicines after you have completely understood and accpet all the possible adverse reactions/side effects.   Do not drive when taking Pain medications or sleeping medications (Benzodaizepines)  Do not take more than prescribed  Pain, Sleep and Anxiety Medications. It is not advisable to combine anxiety,sleep and pain medications without talking with your primary care practitioner  Special Instructions: If you have smoked or chewed Tobacco  in the last 2 yrs please stop smoking, stop any regular Alcohol  and or any Recreational drug use.  Wear Seat belts while driving.  Please note: You were  cared for by a hospitalist during your hospital stay. Once you are discharged, your primary care physician will handle any further medical issues. Please note that NO REFILLS for any discharge medications will be authorized once you are discharged, as it is imperative that you return to your primary care physician (or establish a relationship with a primary care physician if you do not have one) for your post hospital discharge needs so that they can reassess your need for medications and monitor your lab values.   Increase activity slowly    Complete by:  As directed        Medication List    STOP taking these medications   levofloxacin 500 MG tablet Commonly known as:  LEVAQUIN     TAKE these medications   amiodarone 200 MG tablet Commonly known as:  PACERONE TAKE 1 TABLET (200 MG TOTAL) BY MOUTH DAILY.   amLODipine 5 MG tablet Commonly known as:  NORVASC TAKE 1 TABLET (5 MG TOTAL) BY MOUTH DAILY.   amoxicillin 500 MG capsule Commonly known as:  AMOXIL Take 1 capsule (500 mg total) by mouth 2 (two) times daily.   atorvastatin 10 MG tablet Commonly known as:  LIPITOR TAKE 1 TABLET (10 MG TOTAL) BY MOUTH DAILY.   doxazosin 4 MG tablet Commonly known as:  CARDURA Take 1 tablet (4 mg total) by mouth daily.   ELIQUIS 5 MG Tabs tablet Generic drug:  apixaban TAKE 1 TABLET TWICE A DAY What changed:  See the new instructions.   EX-LAX 15 MG Tabs Generic drug:  Sennosides Take 1 tablet by mouth daily as needed (CONSTIPATION).   furosemide 40 MG tablet Commonly known as:  LASIX Take 2 tablets (80 mg total) by mouth 2 (two) times daily.   glimepiride 1 MG tablet Commonly known as:  AMARYL TAKE 1/2 TABLET BY MOUTH TWICE A DAY What changed:  See the new instructions.   JANUVIA 100 MG tablet Generic drug:  sitaGLIPtin TAKE 1 TABLET EVERY DAY What changed:  See the new instructions.   KLOR-CON M20 20 MEQ tablet Generic drug:  potassium chloride SA TAKE ONE TABLET BY  MOUTH ONCE DAILY What changed:  See the new instructions.   lisinopril 20 MG tablet Commonly known as:  PRINIVIL,ZESTRIL TAKE 1 TABLET BY MOUTH EVERY DAY What changed:  See the new instructions.   metoprolol succinate 100 MG 24 hr tablet Commonly known as:  TOPROL-XL TAKE 1 TABLET BY MOUTH TWICE A DAY WITH OR IMMEDIATELY FOLLOWING A MEAL What changed:  how much to take  how to take this  when to take this  additional instructions   SYSTANE OP Place 1 drop into both eyes 3 (three) times daily.   tamoxifen 20 MG tablet Commonly known as:  NOLVADEX Take 1 tablet (20 mg total) by mouth daily.      Follow-up Information    PICKARD,[name] TOM, MD. Schedule an appointment as soon as possible for a visit in 1 week(s).   Specialty:  Family Medicine Contact information: [address] [place] [address] [phone]  Elayne Snare, MD Follow up on 01/10/2016.   Specialty:  Endocrinology Why:  appointment at 11 am Contact information: [address] [place] [address] [phone]          Allergies  Allergen Reactions  . Tape Other (See Comments)    Skin is somewhat sensitive    Consultations:   None  Other Procedures/Studies: Ct Abdomen Pelvis Wo Contrast  Result Date: 01/02/2016 CLINICAL DATA:  Acute onset of increased LFTs. Bacteremia with E. coli. Initial encounter. EXAM: CT ABDOMEN AND PELVIS WITHOUT CONTRAST TECHNIQUE: Multidetector CT imaging of the abdomen and pelvis was performed following the standard protocol without IV contrast. COMPARISON:  None. FINDINGS: Lower chest: Trace bilateral pleural effusions are noted, right greater than left, with mild bibasilar atelectasis. Scattered coronary artery calcifications are seen. The heart is mildly enlarged. Hepatobiliary: Scattered small hypodensities are noted within the liver, nonspecific but possibly reflecting small cysts. A large stone is noted within the gallbladder. The  gallbladder is otherwise unremarkable. The common bile duct remains normal in caliber. Pancreas: The pancreas is within normal limits. Spleen: The spleen is unremarkable in appearance. Adrenals/Urinary Tract: A 3.6 cm heterogeneous mass is noted at the left adrenal gland. The right adrenal gland is grossly unremarkable. Scattered bilateral renal cysts are seen, measuring up to 3.9 cm in size. Mild left-sided pelvicaliectasis likely remains within normal limits, without definite evidence of distal obstruction. There is no evidence of hydronephrosis. No renal or ureteral stones are identified. No perinephric stranding is seen. Stomach/Bowel: The stomach is unremarkable in appearance. The small bowel is within normal limits. The appendix is not visualized; there is no evidence for appendicitis. Contrast progresses to the level of the distal descending colon. Diffuse diverticulosis is noted along the proximal sigmoid colon. Mild wall thickening is noted at the proximal to mid sigmoid colon, which could reflect mild diverticulitis. Vascular/Lymphatic: Diffuse calcification is seen along the abdominal aorta and its branches. The abdominal aorta is otherwise grossly unremarkable. The inferior vena cava is grossly unremarkable. No retroperitoneal lymphadenopathy is seen. No pelvic sidewall lymphadenopathy is identified. Reproductive: The bladder is mildly distended and grossly unremarkable. The patient is status post hysterectomy. No suspicious adnexal masses are seen. The ovaries are grossly symmetric. Other: No additional soft tissue abnormalities are seen. Musculoskeletal: No acute osseous abnormalities are identified. There is chronic loss of height at L3, and mild chronic loss of height at the superior endplate of L4. The patient's left hip screw is incompletely imaged, but appears grossly unremarkable. The visualized musculature is unremarkable in appearance. IMPRESSION: 1. Mild wall thickening at the proximal to mid  sigmoid colon, which could reflect mild diverticulitis. 2. 3.6 cm heterogeneous mass at the left adrenal gland. Malignancy or pheochromocytoma cannot be excluded. Would correlate with adrenal lab values, and consider adrenal protocol MRI or CT for further evaluation. 3. Trace bilateral pleural effusions, right greater than left, with mild bibasilar atelectasis. 4. Scattered coronary artery calcifications seen. Mild cardiomegaly. 5. Scattered bilateral renal cysts seen. 6. Diffuse diverticulosis along the proximal sigmoid colon. 7. Diffuse aortic atherosclerosis noted. 8. Chronic loss of height at L3, and mild chronic loss of height at the superior endplate of L4. Electronically Signed   By: Garald Balding M.D.   On: 01/02/2016 01:55   Dg Chest 2 View  Result Date: 12/30/2015 CLINICAL DATA:  Patient with abnormal blood cultures.  Lethargy. EXAM: CHEST  2 VIEW COMPARISON:  Chest radiograph 12/29/2015. FINDINGS: Patient is rotated to the left. Stable  cardiomegaly. Tortuosity of the thoracic aorta. No consolidative pulmonary opacities. No pleural effusion or pneumothorax. Minimal basilar heterogeneous opacities. Possible small bilateral pleural effusions. Thoracic spine degenerative changes. IMPRESSION: Cardiomegaly. Minimal atelectasis and small bilateral pleural effusions. Aortic atherosclerosis. Electronically Signed   By: Lovey Newcomer M.D.   On: 12/30/2015 17:01   Dg Chest 2 View  Result Date: 12/29/2015 CLINICAL DATA:  Cough.  Shaking at hands in legs after taking Aleve. EXAM: CHEST  2 VIEW COMPARISON:  One-view chest x-ray 02/24/2014 FINDINGS: Cardiomegaly is stable. Atherosclerotic changes are noted at the aortic arch. Emphysematous changes are present. Small bilateral pleural effusions are noted. There is minimal bibasilar atelectasis. No other focal airspace disease is present. Surgical clips are present at the right axilla. IMPRESSION: 1. Cardiomegaly and mild bilateral pleural effusions. These are  nonspecific. They could be related to congestive heart failure although no significant edema is present. 2. Atherosclerosis of the aorta. 3. Surgical clips are present at the right axilla compatible with previous breast surgery and axillary node dissection. Electronically Signed   By: San Morelle M.D.   On: 12/29/2015 16:26      TODAY-DAY OF DISCHARGE:  Subjective:   Whitney [name] today has no headache,no chest abdominal pain,no new weakness tingling or numbness, feels much better wants to go home today.   Objective:   Blood pressure (!) 146/62, pulse (!) 54, temperature 98.1 F (36.7 C), temperature source Oral, resp. rate 18, height 5\' 7"  (1.702 m), weight 84.8 kg (187 lb), SpO2 99 %.  Intake/Output Summary (Last 24 hours) at 01/02/16 1035 Last data filed at 01/02/16 0557  Gross per 24 hour  Intake              600 ml  Output              650 ml  Net              -50 ml   Filed Weights   12/31/15 0209 12/31/15 2125 01/01/16 2035  Weight: 86.9 kg (191 lb 8 oz) 86.2 kg (190 lb) 84.8 kg (187 lb)    Exam: Awake Alert, Oriented *3, No new F.N deficits, Normal affect [place].AT,PERRAL Supple Neck,No JVD, No cervical lymphadenopathy appriciated.  Symmetrical Chest wall movement, Good air movement bilaterally, CTAB RRR,No Gallops,Rubs or new Murmurs, No Parasternal Heave +ve B.Sounds, Abd Soft, Non tender, No organomegaly appriciated, No rebound -guarding or rigidity. No Cyanosis, Clubbing or edema, No new Rash or bruise   PERTINENT RADIOLOGIC STUDIES: Ct Abdomen Pelvis Wo Contrast  Result Date: 01/02/2016 CLINICAL DATA:  Acute onset of increased LFTs. Bacteremia with E. coli. Initial encounter. EXAM: CT ABDOMEN AND PELVIS WITHOUT CONTRAST TECHNIQUE: Multidetector CT imaging of the abdomen and pelvis was performed following the standard protocol without IV contrast. COMPARISON:  None. FINDINGS: Lower chest: Trace bilateral pleural effusions are noted, right greater than  left, with mild bibasilar atelectasis. Scattered coronary artery calcifications are seen. The heart is mildly enlarged. Hepatobiliary: Scattered small hypodensities are noted within the liver, nonspecific but possibly reflecting small cysts. A large stone is noted within the gallbladder. The gallbladder is otherwise unremarkable. The common bile duct remains normal in caliber. Pancreas: The pancreas is within normal limits. Spleen: The spleen is unremarkable in appearance. Adrenals/Urinary Tract: A 3.6 cm heterogeneous mass is noted at the left adrenal gland. The right adrenal gland is grossly unremarkable. Scattered bilateral renal cysts are seen, measuring up to 3.9 cm in size. Mild left-sided pelvicaliectasis likely remains within  normal limits, without definite evidence of distal obstruction. There is no evidence of hydronephrosis. No renal or ureteral stones are identified. No perinephric stranding is seen. Stomach/Bowel: The stomach is unremarkable in appearance. The small bowel is within normal limits. The appendix is not visualized; there is no evidence for appendicitis. Contrast progresses to the level of the distal descending colon. Diffuse diverticulosis is noted along the proximal sigmoid colon. Mild wall thickening is noted at the proximal to mid sigmoid colon, which could reflect mild diverticulitis. Vascular/Lymphatic: Diffuse calcification is seen along the abdominal aorta and its branches. The abdominal aorta is otherwise grossly unremarkable. The inferior vena cava is grossly unremarkable. No retroperitoneal lymphadenopathy is seen. No pelvic sidewall lymphadenopathy is identified. Reproductive: The bladder is mildly distended and grossly unremarkable. The patient is status post hysterectomy. No suspicious adnexal masses are seen. The ovaries are grossly symmetric. Other: No additional soft tissue abnormalities are seen. Musculoskeletal: No acute osseous abnormalities are identified. There is  chronic loss of height at L3, and mild chronic loss of height at the superior endplate of L4. The patient's left hip screw is incompletely imaged, but appears grossly unremarkable. The visualized musculature is unremarkable in appearance. IMPRESSION: 1. Mild wall thickening at the proximal to mid sigmoid colon, which could reflect mild diverticulitis. 2. 3.6 cm heterogeneous mass at the left adrenal gland. Malignancy or pheochromocytoma cannot be excluded. Would correlate with adrenal lab values, and consider adrenal protocol MRI or CT for further evaluation. 3. Trace bilateral pleural effusions, right greater than left, with mild bibasilar atelectasis. 4. Scattered coronary artery calcifications seen. Mild cardiomegaly. 5. Scattered bilateral renal cysts seen. 6. Diffuse diverticulosis along the proximal sigmoid colon. 7. Diffuse aortic atherosclerosis noted. 8. Chronic loss of height at L3, and mild chronic loss of height at the superior endplate of L4. Electronically Signed   By: Garald Balding M.D.   On: 01/02/2016 01:55   Dg Chest 2 View  Result Date: 12/30/2015 CLINICAL DATA:  Patient with abnormal blood cultures.  Lethargy. EXAM: CHEST  2 VIEW COMPARISON:  Chest radiograph 12/29/2015. FINDINGS: Patient is rotated to the left. Stable cardiomegaly. Tortuosity of the thoracic aorta. No consolidative pulmonary opacities. No pleural effusion or pneumothorax. Minimal basilar heterogeneous opacities. Possible small bilateral pleural effusions. Thoracic spine degenerative changes. IMPRESSION: Cardiomegaly. Minimal atelectasis and small bilateral pleural effusions. Aortic atherosclerosis. Electronically Signed   By: Lovey Newcomer M.D.   On: 12/30/2015 17:01   Dg Chest 2 View  Result Date: 12/29/2015 CLINICAL DATA:  Cough.  Shaking at hands in legs after taking Aleve. EXAM: CHEST  2 VIEW COMPARISON:  One-view chest x-ray 02/24/2014 FINDINGS: Cardiomegaly is stable. Atherosclerotic changes are noted at the  aortic arch. Emphysematous changes are present. Small bilateral pleural effusions are noted. There is minimal bibasilar atelectasis. No other focal airspace disease is present. Surgical clips are present at the right axilla. IMPRESSION: 1. Cardiomegaly and mild bilateral pleural effusions. These are nonspecific. They could be related to congestive heart failure although no significant edema is present. 2. Atherosclerosis of the aorta. 3. Surgical clips are present at the right axilla compatible with previous breast surgery and axillary node dissection. Electronically Signed   By: San Morelle M.D.   On: 12/29/2015 16:26     PERTINENT LAB RESULTS: CBC:  Recent Labs  12/30/15 1636 12/31/15 0938  WBC 8.2 6.0  HGB 10.8* 10.2*  HCT 32.9* 31.0*  PLT 116* 114*   CMET CMP     Component Value Date/Time  NA 141 01/02/2016 0529   NA 141 12/22/2013 1000   K 3.8 01/02/2016 0529   K 3.1 (L) 12/22/2013 1000   CL 109 01/02/2016 0529   CO2 28 01/02/2016 0529   CO2 30 (H) 12/22/2013 1000   GLUCOSE 75 01/02/2016 0529   GLUCOSE 166 (H) 12/22/2013 1000   BUN 21 (H) 01/02/2016 0529   BUN 26.0 12/22/2013 1000   CREATININE 1.32 (H) 01/02/2016 0529   CREATININE 1.46 (H) 04/30/2015 1545   CREATININE 1.3 (H) 12/22/2013 1000   CALCIUM 8.1 (L) 01/02/2016 0529   CALCIUM 10.0 12/22/2013 1000   PROT 4.7 (L) 01/02/2016 0529   PROT 6.2 (L) 12/22/2013 1000   ALBUMIN 2.4 (L) 01/02/2016 0529   ALBUMIN 2.6 (L) 12/22/2013 1000   AST 70 (H) 01/02/2016 0529   AST 11 12/22/2013 1000   ALT 131 (H) 01/02/2016 0529   ALT 8 12/22/2013 1000   ALKPHOS 42 01/02/2016 0529   ALKPHOS 66 12/22/2013 1000   BILITOT 0.3 01/02/2016 0529   BILITOT 0.29 12/22/2013 1000   GFRNONAA 36 (L) 01/02/2016 0529   GFRNONAA 33 (L) 04/30/2015 1545   GFRAA 41 (L) 01/02/2016 0529   GFRAA 38 (L) 04/30/2015 1545    GFR Estimated Creatinine Clearance: 34.9 mL/min (by C-G formula based on SCr of 1.32 mg/dL (H)). No results for  input(s): LIPASE, AMYLASE in the last 72 hours. No results for input(s): CKTOTAL, CKMB, CKMBINDEX, TROPONINI in the last 72 hours. Invalid input(s): POCBNP No results for input(s): DDIMER in the last 72 hours. No results for input(s): HGBA1C in the last 72 hours. No results for input(s): CHOL, HDL, LDLCALC, TRIG, CHOLHDL, LDLDIRECT in the last 72 hours. No results for input(s): TSH, T4TOTAL, T3FREE, THYROIDAB in the last 72 hours.  Invalid input(s): FREET3 No results for input(s): VITAMINB12, FOLATE, FERRITIN, TIBC, IRON, RETICCTPCT in the last 72 hours. Coags: No results for input(s): INR in the last 72 hours.  Invalid input(s): PT Microbiology: Recent Results (from the past 240 hour(s))  Blood culture (routine x 2)     Status: Abnormal   Collection Time: 12/29/15  5:05 PM  Result Value Ref Range Status   Specimen Description BLOOD RIGHT ANTECUBITAL  Final   Special Requests BOTTLES DRAWN AEROBIC AND ANAEROBIC 5CC  Final   Culture  Setup Time   Final    GRAM NEGATIVE RODS AEROBIC BOTTLE ONLY CRITICAL RESULT CALLED TO, READ BACK BY AND VERIFIED WITH: Garlon Hatchet RN 13:10 12/30/15 ([email or web address])    Culture ESCHERICHIA COLI (A)  Final   Report Status 01/01/2016 FINAL  Final   Organism ID, Bacteria ESCHERICHIA COLI  Final      Susceptibility   Escherichia coli - MIC*    AMPICILLIN <=2 SENSITIVE Sensitive     CEFAZOLIN <=4 SENSITIVE Sensitive     CEFEPIME <=1 SENSITIVE Sensitive     CEFTAZIDIME <=1 SENSITIVE Sensitive     CEFTRIAXONE <=1 SENSITIVE Sensitive     CIPROFLOXACIN <=0.25 SENSITIVE Sensitive     GENTAMICIN <=1 SENSITIVE Sensitive     IMIPENEM <=0.25 SENSITIVE Sensitive     TRIMETH/SULFA <=20 SENSITIVE Sensitive     AMPICILLIN/SULBACTAM <=2 SENSITIVE Sensitive     PIP/TAZO <=4 SENSITIVE Sensitive     Extended ESBL NEGATIVE Sensitive     * ESCHERICHIA COLI  Blood Culture ID Panel (Reflexed)     Status: Abnormal   Collection Time: 12/29/15  5:05 PM  Result Value Ref Range  Status   Enterococcus species NOT DETECTED NOT  DETECTED Final   Listeria monocytogenes NOT DETECTED NOT DETECTED Final   Staphylococcus species NOT DETECTED NOT DETECTED Final   Staphylococcus aureus NOT DETECTED NOT DETECTED Final   Streptococcus species NOT DETECTED NOT DETECTED Final   Streptococcus agalactiae NOT DETECTED NOT DETECTED Final   Streptococcus pneumoniae NOT DETECTED NOT DETECTED Final   Streptococcus pyogenes NOT DETECTED NOT DETECTED Final   Acinetobacter baumannii NOT DETECTED NOT DETECTED Final   Enterobacteriaceae species DETECTED (A) NOT DETECTED Final    Comment: CRITICAL RESULT CALLED TO, READ BACK BY AND VERIFIED WITH: Garlon Hatchet RN 13:10 12/30/15 ([email or web address])    Enterobacter cloacae complex NOT DETECTED NOT DETECTED Final   Escherichia coli DETECTED (A) NOT DETECTED Final    Comment: CRITICAL RESULT CALLED TO, READ BACK BY AND VERIFIED WITH: Garlon Hatchet RN 13:10 12/30/15 ([email or web address])    Klebsiella oxytoca NOT DETECTED NOT DETECTED Final   Klebsiella pneumoniae NOT DETECTED NOT DETECTED Final   Proteus species NOT DETECTED NOT DETECTED Final   Serratia marcescens NOT DETECTED NOT DETECTED Final   Carbapenem resistance NOT DETECTED NOT DETECTED Final   Haemophilus influenzae NOT DETECTED NOT DETECTED Final   Neisseria meningitidis NOT DETECTED NOT DETECTED Final   Pseudomonas aeruginosa NOT DETECTED NOT DETECTED Final   Candida albicans NOT DETECTED NOT DETECTED Final   Candida glabrata NOT DETECTED NOT DETECTED Final   Candida krusei NOT DETECTED NOT DETECTED Final   Candida parapsilosis NOT DETECTED NOT DETECTED Final   Candida tropicalis NOT DETECTED NOT DETECTED Final  Blood culture (routine x 2)     Status: None (Preliminary result)   Collection Time: 12/29/15  5:13 PM  Result Value Ref Range Status   Specimen Description BLOOD LEFT FOREARM  Final   Special Requests BOTTLES DRAWN AEROBIC ONLY 5CC  Final   Culture NO GROWTH 3 DAYS  Final   Report Status  PENDING  Incomplete  Urine culture     Status: None   Collection Time: 12/30/15  3:34 PM  Result Value Ref Range Status   Specimen Description URINE, RANDOM  Final   Special Requests NONE  Final   Culture NO GROWTH 1 DAY  Final   Report Status 12/31/2015 FINAL  Final  Blood culture (routine x 2)     Status: None (Preliminary result)   Collection Time: 12/30/15  4:36 PM  Result Value Ref Range Status   Specimen Description BLOOD LEFT ANTECUBITAL  Final   Special Requests BOTTLES DRAWN AEROBIC AND ANAEROBIC 5CC  Final   Culture NO GROWTH 2 DAYS  Final   Report Status PENDING  Incomplete    FURTHER DISCHARGE INSTRUCTIONS:  Get Medicines reviewed and adjusted: Please take all your medications with you for your next visit with your Primary MD  Laboratory/radiological data: Please request your Primary MD to go over all hospital tests and procedure/radiological results at the follow up, please ask your Primary MD to get all Hospital records sent to his/her office.  In some cases, they will be blood work, cultures and biopsy results pending at the time of your discharge. Please request that your primary care M.D. goes through all the records of your hospital data and follows up on these results.  Also Note the following: If you experience worsening of your admission symptoms, develop shortness of breath, life threatening emergency, suicidal or homicidal thoughts you must seek medical attention immediately by calling 911 or calling your MD immediately  if symptoms less severe.  You must read complete instructions/literature along  with all the possible adverse reactions/side effects for all the Medicines you take and that have been prescribed to you. Take any new Medicines after you have completely understood and accpet all the possible adverse reactions/side effects.   Do not drive when taking Pain medications or sleeping medications (Benzodaizepines)  Do not take more than prescribed Pain,  Sleep and Anxiety Medications. It is not advisable to combine anxiety,sleep and pain medications without talking with your primary care practitioner  Special Instructions: If you have smoked or chewed Tobacco  in the last 2 yrs please stop smoking, stop any regular Alcohol  and or any Recreational drug use.  Wear Seat belts while driving.  Please note: You were cared for by a hospitalist during your hospital stay. Once you are discharged, your primary care physician will handle any further medical issues. Please note that NO REFILLS for any discharge medications will be authorized once you are discharged, as it is imperative that you return to your primary care physician (or establish a relationship with a primary care physician if you do not have one) for your post hospital discharge needs so that they can reassess your need for medications and monitor your lab values.  Total Time spent coordinating discharge including counseling, education and face to face time equals 45 minutes.  SignedOren Binet 01/02/2016 10:35 AM

## 2016-01-02 NOTE — Progress Notes (Signed)
Whitney [name] to be D/C'd Home per MD order.  Discussed prescriptions and follow up appointments with the patient. Prescriptions given to patient, medication list explained in detail. Pt verbalized understanding.    Medication List    STOP taking these medications   levofloxacin 500 MG tablet Commonly known as:  LEVAQUIN     TAKE these medications   amiodarone 200 MG tablet Commonly known as:  PACERONE TAKE 1 TABLET (200 MG TOTAL) BY MOUTH DAILY.   amLODipine 5 MG tablet Commonly known as:  NORVASC TAKE 1 TABLET (5 MG TOTAL) BY MOUTH DAILY.   amoxicillin 500 MG capsule Commonly known as:  AMOXIL Take 1 capsule (500 mg total) by mouth 2 (two) times daily.   atorvastatin 10 MG tablet Commonly known as:  LIPITOR TAKE 1 TABLET (10 MG TOTAL) BY MOUTH DAILY.   doxazosin 4 MG tablet Commonly known as:  CARDURA Take 1 tablet (4 mg total) by mouth daily.   ELIQUIS 5 MG Tabs tablet Generic drug:  apixaban TAKE 1 TABLET TWICE A DAY What changed:  See the new instructions.   EX-LAX 15 MG Tabs Generic drug:  Sennosides Take 1 tablet by mouth daily as needed (CONSTIPATION).   furosemide 40 MG tablet Commonly known as:  LASIX Take 2 tablets (80 mg total) by mouth 2 (two) times daily.   glimepiride 1 MG tablet Commonly known as:  AMARYL TAKE 1/2 TABLET BY MOUTH TWICE A DAY What changed:  See the new instructions.   JANUVIA 100 MG tablet Generic drug:  sitaGLIPtin TAKE 1 TABLET EVERY DAY What changed:  See the new instructions.   KLOR-CON M20 20 MEQ tablet Generic drug:  potassium chloride SA TAKE ONE TABLET BY MOUTH ONCE DAILY What changed:  See the new instructions.   lisinopril 20 MG tablet Commonly known as:  PRINIVIL,ZESTRIL TAKE 1 TABLET BY MOUTH EVERY DAY What changed:  See the new instructions.   metoprolol succinate 100 MG 24 hr tablet Commonly known as:  TOPROL-XL TAKE 1 TABLET BY MOUTH TWICE A DAY WITH OR IMMEDIATELY FOLLOWING A MEAL What  changed:  how much to take  how to take this  when to take this  additional instructions   SYSTANE OP Place 1 drop into both eyes 3 (three) times daily.   tamoxifen 20 MG tablet Commonly known as:  NOLVADEX Take 1 tablet (20 mg total) by mouth daily.       Vitals:   01/02/16 0557 01/02/16 1002  BP: (!) 145/59 (!) 146/62  Pulse: (!) 56 (!) 54  Resp: 20 18  Temp: 98.4 F (36.9 C) 98.1 F (36.7 C)    Skin clean, dry and intact without evidence of skin break down, no evidence of skin tears noted. IV catheter discontinued intact. Site without signs and symptoms of complications. Dressing and pressure applied. Pt denies pain at this time. No complaints noted.  An After Visit Summary was printed and given to the patient. Patient escorted via [place], and D/C home via private auto.  Haywood Lasso BSN, RN [facility] Phone [phone]

## 2016-01-02 NOTE — Consult Note (Signed)
            [facility] CM Primary Care Navigator  01/02/2016  TIFFNY ROMANEK 1928/03/03 [id number]  Wentto see patient in her room for possible discharge needs but nursing staff statespatient just got discharged today.  Primary care provider's office called Lovey Newcomer, RN) to notify of patient's discharge and need for post hospital follow-up and possible transition of care.  For additional questions please contact:  Edwena Felty A. Aylan Bayona, BSN, RN-BC [facility] [place] PRIMARY CARE Navigator Cell: [phone]

## 2016-01-03 ENCOUNTER — Other Ambulatory Visit: Payer: Medicare Other

## 2016-01-03 LAB — CULTURE, BLOOD (ROUTINE X 2): Culture: NO GROWTH

## 2016-01-04 LAB — CULTURE, BLOOD (ROUTINE X 2): Culture: NO GROWTH

## 2016-01-06 ENCOUNTER — Other Ambulatory Visit: Payer: Medicare Other

## 2016-01-06 ENCOUNTER — Encounter: Payer: Self-pay | Admitting: *Deleted

## 2016-01-06 DIAGNOSIS — R7881 Bacteremia: Secondary | ICD-10-CM | POA: Diagnosis not present

## 2016-01-06 DIAGNOSIS — R7989 Other specified abnormal findings of blood chemistry: Secondary | ICD-10-CM

## 2016-01-06 DIAGNOSIS — R945 Abnormal results of liver function studies: Secondary | ICD-10-CM

## 2016-01-07 LAB — COMPLETE METABOLIC PANEL WITH GFR
ALT: 120 U/L — ABNORMAL HIGH (ref 6–29)
AST: 88 U/L — ABNORMAL HIGH (ref 10–35)
Albumin: 3 g/dL — ABNORMAL LOW (ref 3.6–5.1)
Alkaline Phosphatase: 50 U/L (ref 33–130)
BUN: 28 mg/dL — ABNORMAL HIGH (ref 7–25)
CO2: 27 mmol/L (ref 20–31)
Calcium: 8.6 mg/dL (ref 8.6–10.4)
Chloride: 104 mmol/L (ref 98–110)
Creat: 1.92 mg/dL — ABNORMAL HIGH (ref 0.60–0.88)
GFR, Est African American: 27 mL/min — ABNORMAL LOW (ref >=60)
GFR, Est Non African American: 23 mL/min — ABNORMAL LOW (ref >=60)
Glucose, Bld: 102 mg/dL — ABNORMAL HIGH (ref 70–99)
Potassium: 4.2 mmol/L (ref 3.5–5.3)
Sodium: 141 mmol/L (ref 135–146)
Total Bilirubin: 0.5 mg/dL (ref 0.2–1.2)
Total Protein: 5.3 g/dL — ABNORMAL LOW (ref 6.1–8.1)

## 2016-01-07 LAB — CBC WITH DIFFERENTIAL/PLATELET
Basophils Absolute: 0 cells/uL (ref 0–200)
Basophils Relative: 0 %
Eosinophils Absolute: 106 cells/uL (ref 15–500)
Eosinophils Relative: 2 %
HCT: 30.5 % — ABNORMAL LOW (ref 35.0–45.0)
Hemoglobin: 10 g/dL — ABNORMAL LOW (ref 12.0–15.0)
Lymphocytes Relative: 13 %
Lymphs Abs: 689 cells/uL — ABNORMAL LOW (ref 850–3900)
MCH: 30.1 pg (ref 27.0–33.0)
MCHC: 32.8 g/dL (ref 32.0–36.0)
MCV: 91.9 fL (ref 80.0–100.0)
MPV: 10.2 fL (ref 7.5–12.5)
Monocytes Absolute: 424 cells/uL (ref 200–950)
Monocytes Relative: 8 %
Neutro Abs: 4081 cells/uL (ref 1500–7800)
Neutrophils Relative %: 77 %
Platelets: 162 10*3/uL (ref 140–400)
RBC: 3.32 MIL/uL — ABNORMAL LOW (ref 3.80–5.10)
RDW: 15.8 % — ABNORMAL HIGH (ref 11.0–15.0)
WBC: 5.3 10*3/uL (ref 3.8–10.8)

## 2016-01-10 ENCOUNTER — Ambulatory Visit: Payer: Medicare Other | Admitting: Endocrinology

## 2016-01-13 ENCOUNTER — Encounter: Payer: Self-pay | Admitting: Family Medicine

## 2016-01-13 ENCOUNTER — Ambulatory Visit (INDEPENDENT_AMBULATORY_CARE_PROVIDER_SITE_OTHER): Payer: Medicare Other | Admitting: Family Medicine

## 2016-01-13 VITALS — BP 110/58 | HR 72 | Temp 98.0°F | Resp 16 | Ht 66.5 in | Wt 189.0 lb

## 2016-01-13 DIAGNOSIS — E278 Other specified disorders of adrenal gland: Secondary | ICD-10-CM

## 2016-01-13 DIAGNOSIS — Z09 Encounter for follow-up examination after completed treatment for conditions other than malignant neoplasm: Secondary | ICD-10-CM | POA: Diagnosis not present

## 2016-01-13 DIAGNOSIS — N183 Chronic kidney disease, stage 3 unspecified: Secondary | ICD-10-CM

## 2016-01-13 DIAGNOSIS — Z79899 Other long term (current) drug therapy: Secondary | ICD-10-CM | POA: Diagnosis not present

## 2016-01-13 DIAGNOSIS — E7800 Pure hypercholesterolemia, unspecified: Secondary | ICD-10-CM | POA: Diagnosis not present

## 2016-01-13 DIAGNOSIS — I10 Essential (primary) hypertension: Secondary | ICD-10-CM

## 2016-01-13 DIAGNOSIS — E279 Disorder of adrenal gland, unspecified: Secondary | ICD-10-CM | POA: Diagnosis not present

## 2016-01-13 NOTE — Progress Notes (Signed)
Subjective:    Patient ID: Whitney [name], female    DOB: [birth year], 82 y.o.   MRN: [id number]  HPI  Patient is an 82 year old African-American female who was recently admitted with bacteremia. Blood culture showed pansensitive Escherichia coli. Source was unknown although CT scan did suggest possible diverticulitis. Patient completed the antibiotics prescribed by the infectious disease specialist at the hospital. She was discharged on Bactrim. She is here today for follow-up. She denies any signs of systemic illness. She denies any fevers or chills or weakness. She has a history of chronic kidney disease. However her discharge creatinine had elevated significantly last week. Therefore I had the patient did decrease her dose of Lasix to 40 mg twice a day. Her liver function tests were also elevated in the hospital. This was presumed to be due to the bacteremia/sepsis. I did have my staff call her last week and ask her to temporarily discontinue the Lipitor until her liver function tests normalized. She is here today for follow-up. During her hospitalization, the patient had a CT scan of the abdomen and pelvis to try to determine the etiology of her bacteremia. There is a coincidental finding of 3.6 cm adrenal mass on the left adrenal gland of unknown significance. They recommended a follow-up MRI. Patient's baseline creatinine is 1.9. Therefore she will need a noncontrasted MRI. They were concerned about possible pheochromocytoma. However today the patient is not tachycardic and if anything her blood pressure is low. Since her recent illness, the patient's blood sugars have been averaging between 60 and 150. The majority of the time they're low and she is hypoglycemic. She was taking the Januvia but she is also taking glimepiride. Past Medical History:  Diagnosis Date  . Anemia   . Atrial fibrillation ([place])   . Breast cancer ([place]) 10/2013   right upper outer  . Cancer ([place])    right breast  .  Complication of anesthesia    slow to wake up  . Diabetes mellitus without complication ([place])   . Dysrhythmia 10/15   AF  . Former smoker   . Full dentures   . Hyperlipidemia   . Hypertension   . Multinodular goiter   . Radiation    Right Breast  . Thyroid disease    hypothyroidism  . Wears glasses    Past Surgical History:  Procedure Laterality Date  . ABDOMINAL HYSTERECTOMY    . AXILLARY LYMPH NODE DISSECTION Right 01/09/2014   Procedure: RIGHT AXILLARY LYMPH NODE DISECTION;  Surgeon: [name], MD;  Location: [place];  Service: General;  Laterality: Right;  . BREAST LUMPECTOMY WITH RADIOACTIVE SEED LOCALIZATION Right 01/09/2014   Procedure: RIGHT BREAST SEED LOCALIZED LUMPECTOMY ;  Surgeon: [name], MD;  Location: [place];  Service: General;  Laterality: Right;  . EYE SURGERY  12/22/2009   cataracts  . INTRAMEDULLARY (IM) NAIL INTERTROCHANTERIC Left 02/24/2014   Procedure: IM ROD LEFT HIP FX;  Surgeon: Alta Corning, MD;  Location: [place];  Service: Orthopedics;  Laterality: Left;  . PORT-A-CATH REMOVAL Right 01/09/2014   Procedure: REMOVAL PORT-A-CATH;  Surgeon: [name], MD;  Location: [place];  Service: General;  Laterality: Right;  . PORTACATH PLACEMENT N/A 11/15/2013   Procedure: INSERTION PORT-A-CATH WITH ULTRA SOUND AND FLOROSCOPY;  Surgeon: [name], MD;  Location: [place];  Service: General;  Laterality: N/A;  . TOTAL KNEE ARTHROPLASTY  2002   left   Current Outpatient Prescriptions on File Prior  to Visit  Medication Sig Dispense Refill  . amiodarone (PACERONE) 200 MG tablet TAKE 1 TABLET (200 MG TOTAL) BY MOUTH DAILY. 90 tablet 2  . amLODipine (NORVASC) 5 MG tablet TAKE 1 TABLET (5 MG TOTAL) BY MOUTH DAILY. 30 tablet 3  . atorvastatin (LIPITOR) 10 MG tablet TAKE 1 TABLET (10 MG TOTAL) BY MOUTH DAILY. 90 tablet 1  . doxazosin (CARDURA) 4 MG tablet Take 1 tablet (4 mg total) by mouth daily. 90  tablet 2  . ELIQUIS 5 MG TABS tablet TAKE 1 TABLET TWICE A DAY (Patient taking differently: Take 5 mg by mouth two times a day) 60 tablet 11  . furosemide (LASIX) 40 MG tablet Take 2 tablets (80 mg total) by mouth 2 (two) times daily. 360 tablet 2  . glimepiride (AMARYL) 1 MG tablet TAKE 1/2 TABLET BY MOUTH TWICE A DAY (Patient taking differently: Take 0.5 mg by mouth two times a day) 30 tablet 3  . JANUVIA 100 MG tablet TAKE 1 TABLET EVERY DAY (Patient taking differently: Take 100 mg by mouth once a day) 30 tablet 3  . KLOR-CON M20 20 MEQ tablet TAKE ONE TABLET BY MOUTH ONCE DAILY (Patient taking differently: Take 10 mEq once a day) 30 tablet 3  . lisinopril (PRINIVIL,ZESTRIL) 20 MG tablet TAKE 1 TABLET BY MOUTH EVERY DAY (Patient taking differently: Take 20 mg by mouth once a day) 30 tablet 11  . metoprolol succinate (TOPROL-XL) 100 MG 24 hr tablet TAKE 1 TABLET BY MOUTH TWICE A DAY WITH OR IMMEDIATELY FOLLOWING A MEAL (Patient taking differently: Take 100 mg by mouth 2 (two) times daily. WITH OR IMMEDIATELY FOLLOWING A MEAL) 180 tablet 1  . Polyethyl Glycol-Propyl Glycol (SYSTANE OP) Place 1 drop into both eyes 3 (three) times daily.    . Sennosides (EX-LAX) 15 MG TABS Take 1 tablet by mouth daily as needed (CONSTIPATION).    Marland Kitchen tamoxifen (NOLVADEX) 20 MG tablet Take 1 tablet (20 mg total) by mouth daily. 90 tablet 3   No current facility-administered medications on file prior to visit.    Allergies  Allergen Reactions  . Tape Other (See Comments)    Skin is somewhat sensitive   Social History   Social History  . Marital status: Widowed    Spouse name: N/A  . Number of children: 4  . Years of education: N/A   Occupational History  . Not on file.   Social History Main Topics  . Smoking status: Former Smoker    Packs/day: 1.00    Years: 5.00    Quit date: 11/14/1958  . Smokeless tobacco: Never Used  . Alcohol use No  . Drug use: No  . Sexual activity: No     Comment: menarche  age 16, P58, first birth age 29, no HRT, menopause age 17   Other Topics Concern  . Not on file   Social History Narrative  . No narrative on file     Review of Systems  All other systems reviewed and are negative.      Objective:   Physical Exam  Constitutional: She appears well-developed and well-nourished. No distress.  Neck: No JVD present.  Cardiovascular: Normal rate and normal heart sounds.  An irregularly irregular rhythm present.  Pulmonary/Chest: Effort normal and breath sounds normal. No respiratory distress. She has no wheezes. She has no rales.  Abdominal: Soft. Bowel sounds are normal.  Musculoskeletal: She exhibits edema.  Skin: She is not diaphoretic.  Vitals reviewed.  Assessment & Plan:  Hospital discharge follow-up - Plan: CBC with Differential/Platelet, COMPLETE METABOLIC PANEL WITH GFR, Culture, blood (single) w Reflex to ID Panel  Benign essential HTN  CKD (chronic kidney disease) stage 3, GFR 30-59 ml/min  Pure hypercholesterolemia  Left adrenal mass (HCC) - Plan: MR ABDOMEN WO CONTRAST  At the present time, the patient demonstrates no evidence of pheochromocytoma based on her heart rate and blood pressure. I will schedule the patient for an MRI of the left adrenal gland further characterize the mass. However because of her chronic kidney disease I'll schedule without contrast. Her blood pressure today is well controlled. I will follow-up her renal function by checking a CMP. I'll also follow-up her liver function test. At the present time her edema seems well controlled on 40 mg of Lasix twice a day and therefore I see no reason to increase that. Once her liver function tests have normalized, I will have the patient resume Lipitor. At the present time it appears that patient had Escherichia coli bacteremia from a GI source possibly diverticulitis. I will repeat a blood culture today but at the present time she is asymptomatic.

## 2016-01-14 ENCOUNTER — Ambulatory Visit (INDEPENDENT_AMBULATORY_CARE_PROVIDER_SITE_OTHER): Payer: Medicare Other | Admitting: Endocrinology

## 2016-01-14 ENCOUNTER — Encounter: Payer: Self-pay | Admitting: Endocrinology

## 2016-01-14 VITALS — BP 90/52 | HR 54 | Ht 66.0 in | Wt 190.0 lb

## 2016-01-14 DIAGNOSIS — E279 Disorder of adrenal gland, unspecified: Secondary | ICD-10-CM | POA: Diagnosis not present

## 2016-01-14 DIAGNOSIS — E11649 Type 2 diabetes mellitus with hypoglycemia without coma: Secondary | ICD-10-CM | POA: Diagnosis not present

## 2016-01-14 DIAGNOSIS — I951 Orthostatic hypotension: Secondary | ICD-10-CM | POA: Diagnosis not present

## 2016-01-14 DIAGNOSIS — E278 Other specified disorders of adrenal gland: Secondary | ICD-10-CM

## 2016-01-14 LAB — COMPLETE METABOLIC PANEL WITH GFR
ALT: 260 U/L — ABNORMAL HIGH (ref 6–29)
AST: 109 U/L — ABNORMAL HIGH (ref 10–35)
Albumin: 3.4 g/dL — ABNORMAL LOW (ref 3.6–5.1)
Alkaline Phosphatase: 71 U/L (ref 33–130)
BUN: 30 mg/dL — ABNORMAL HIGH (ref 7–25)
CO2: 26 mmol/L (ref 20–31)
Calcium: 9.5 mg/dL (ref 8.6–10.4)
Chloride: 108 mmol/L (ref 98–110)
Creat: 1.83 mg/dL — ABNORMAL HIGH (ref 0.60–0.88)
GFR, Est African American: 29 mL/min — ABNORMAL LOW (ref >=60)
GFR, Est Non African American: 25 mL/min — ABNORMAL LOW (ref >=60)
Glucose, Bld: 31 mg/dL — CL (ref 70–99)
Potassium: 4.6 mmol/L (ref 3.5–5.3)
Sodium: 141 mmol/L (ref 135–146)
Total Bilirubin: 0.5 mg/dL (ref 0.2–1.2)
Total Protein: 6 g/dL — ABNORMAL LOW (ref 6.1–8.1)

## 2016-01-14 LAB — CBC WITH DIFFERENTIAL/PLATELET
Basophils Absolute: 0 cells/uL (ref 0–200)
Basophils Relative: 0 %
Eosinophils Absolute: 56 cells/uL (ref 15–500)
Eosinophils Relative: 1 %
HCT: 34.2 % — ABNORMAL LOW (ref 35.0–45.0)
Hemoglobin: 10.9 g/dL — ABNORMAL LOW (ref 12.0–15.0)
Lymphocytes Relative: 15 %
Lymphs Abs: 840 cells/uL — ABNORMAL LOW (ref 850–3900)
MCH: 29.2 pg (ref 27.0–33.0)
MCHC: 31.9 g/dL — ABNORMAL LOW (ref 32.0–36.0)
MCV: 91.7 fL (ref 80.0–100.0)
MPV: 10.6 fL (ref 7.5–12.5)
Monocytes Absolute: 616 cells/uL (ref 200–950)
Monocytes Relative: 11 %
Neutro Abs: 4088 cells/uL (ref 1500–7800)
Neutrophils Relative %: 73 %
Platelets: 209 10*3/uL (ref 140–400)
RBC: 3.73 MIL/uL — ABNORMAL LOW (ref 3.80–5.10)
RDW: 16.2 % — ABNORMAL HIGH (ref 11.0–15.0)
WBC: 5.6 10*3/uL (ref 3.8–10.8)

## 2016-01-14 MED ORDER — DEXAMETHASONE 1 MG PO TABS: ORAL_TABLET | ORAL | 1 refills | Status: DC

## 2016-01-14 NOTE — Progress Notes (Signed)
Patient ID: Whitney [name], female   DOB: 11/08/1927, 82 y.o.   MRN: [id number]   Reason for Appointment: Diabetes and other problems  History of Present Illness    ADRENAL mass:  She had a CT scan done during her recent hospitalization and again was found to have a left adrenal mass She is referred here for further evaluation  She was evaluated previously by a PET scan after  diagnosis of breast cancer in 2015 She was found to have a 3.7 cm hypermetabolic left adrenal mass   On a previous CT scan in 2011 she also had a 3.8 cm adrenal mass Cortisol level is normal in 2015 and 2016 No history of hypokalemia or severe hypertension/palpitations, headaches, flushing or significant fluctuations in blood pressure    Type 2 DIABETES MELITUS, date of diagnosis:  1983     Previous history: She had been taking metformin and Actos for several years.  She usually has had excellent control with upper normal A1c  Recent history:   Oral hypoglycemic drugs: Amaryl 0.5 mg at breakfast stopped on 10/30    She was previously on  Metformin and Actos; because of edema she was told to stop Actos Her metformin was stopped because of renal dysfunction Now she is  With taking only 0.5 mg Amaryl in the morning she has been having good blood sugar control at home although has had occasional readings in the 60s, lowest 67 Her A1c usually lower than expected for her blood sugars and is lower at 5.0 now  Her granddaughter is checking her blood sugars mostly 1 hour after supper       Monitors blood glucose:   Once a day .    Glucometer: One Touch.          Blood Glucose readings from download:   Mean values apply above for all meters except median for One Touch  PRE-MEAL Fasting Lunch Dinner Bedtime Overall  Glucose range: 122-163  117   68-161    Mean/median:    123  111     Physical activity: exercise: Only walking around the house       Diet: She Is avoiding fried and fast food,  sometimes will have some light lemonade                Wt Readings from Last 3 Encounters:  01/14/16 190 lb (86.2 kg)  01/13/16 189 lb (85.7 kg)  01/01/16 187 lb (84.8 kg)    LABS:  Lab Results  Component Value Date   HGBA1C 5.3 09/16/2015   HGBA1C 5.3 06/17/2015   HGBA1C 5 03/19/2015   Lab Results  Component Value Date   MICROALBUR 6.7 (H) 09/16/2015   LDLCALC 85 09/16/2015   CREATININE 1.83 (H) 01/13/2016     OTHER problems  are discussed in review of systems        Medication List       Accurate as of 01/14/16 12:56 PM. Always use your most recent med list.          amiodarone 200 MG tablet Commonly known as:  PACERONE TAKE 1 TABLET (200 MG TOTAL) BY MOUTH DAILY.   amLODipine 5 MG tablet Commonly known as:  NORVASC TAKE 1 TABLET (5 MG TOTAL) BY MOUTH DAILY.   atorvastatin 10 MG tablet Commonly known as:  LIPITOR TAKE 1 TABLET (10 MG TOTAL) BY MOUTH DAILY.   doxazosin 4 MG tablet Commonly known as:  CARDURA Take 1 tablet (4  mg total) by mouth daily.   ELIQUIS 5 MG Tabs tablet Generic drug:  apixaban TAKE 1 TABLET TWICE A DAY   EX-LAX 15 MG Tabs Generic drug:  Sennosides Take 1 tablet by mouth daily as needed (CONSTIPATION).   furosemide 40 MG tablet Commonly known as:  LASIX Take 2 tablets (80 mg total) by mouth 2 (two) times daily.   glimepiride 1 MG tablet Commonly known as:  AMARYL TAKE 1/2 TABLET BY MOUTH TWICE A DAY   JANUVIA 100 MG tablet Generic drug:  sitaGLIPtin TAKE 1 TABLET EVERY DAY   KLOR-CON M20 20 MEQ tablet Generic drug:  potassium chloride SA TAKE ONE TABLET BY MOUTH ONCE DAILY   lisinopril 20 MG tablet Commonly known as:  PRINIVIL,ZESTRIL TAKE 1 TABLET BY MOUTH EVERY DAY   metoprolol succinate 100 MG 24 hr tablet Commonly known as:  TOPROL-XL TAKE 1 TABLET BY MOUTH TWICE A DAY WITH OR IMMEDIATELY FOLLOWING A MEAL   SYSTANE OP Place 1 drop into both eyes 3 (three) times daily.   tamoxifen 20 MG  tablet Commonly known as:  NOLVADEX Take 1 tablet (20 mg total) by mouth daily.       Allergies:  Allergies  Allergen Reactions  . Tape Other (See Comments)    Skin is somewhat sensitive    Past Medical History:  Diagnosis Date  . Anemia   . Atrial fibrillation ([place])   . Breast cancer ([place]) 10/2013   right upper outer  . Cancer ([place])    right breast  . Complication of anesthesia    slow to wake up  . Diabetes mellitus without complication ([place])   . Dysrhythmia 10/15   AF  . Former smoker   . Full dentures   . Hyperlipidemia   . Hypertension   . Multinodular goiter   . Radiation    Right Breast  . Thyroid disease    hypothyroidism  . Wears glasses     Past Surgical History:  Procedure Laterality Date  . ABDOMINAL HYSTERECTOMY    . AXILLARY LYMPH NODE DISSECTION Right 01/09/2014   Procedure: RIGHT AXILLARY LYMPH NODE DISECTION;  Surgeon: [name], MD;  Location: [place];  Service: General;  Laterality: Right;  . BREAST LUMPECTOMY WITH RADIOACTIVE SEED LOCALIZATION Right 01/09/2014   Procedure: RIGHT BREAST SEED LOCALIZED LUMPECTOMY ;  Surgeon: [name], MD;  Location: [place];  Service: General;  Laterality: Right;  . EYE SURGERY  12/22/2009   cataracts  . INTRAMEDULLARY (IM) NAIL INTERTROCHANTERIC Left 02/24/2014   Procedure: IM ROD LEFT HIP FX;  Surgeon: Alta Corning, MD;  Location: [place];  Service: Orthopedics;  Laterality: Left;  . PORT-A-CATH REMOVAL Right 01/09/2014   Procedure: REMOVAL PORT-A-CATH;  Surgeon: [name], MD;  Location: [place];  Service: General;  Laterality: Right;  . PORTACATH PLACEMENT N/A 11/15/2013   Procedure: INSERTION PORT-A-CATH WITH ULTRA SOUND AND FLOROSCOPY;  Surgeon: [name], MD;  Location: [place];  Service: General;  Laterality: N/A;  . TOTAL KNEE ARTHROPLASTY  2002   left    No family history on file.  Social History:  reports that she quit smoking  about 57 years ago. She has a 5.00 pack-year smoking history. She has never used smokeless tobacco. She reports that she does not drink alcohol or use drugs.  Review of Systems:  Hypertension:  She is now taking lisinopril 20 mg along with amlodipine 5 mg daily and followed by primary care  However complaining about feeling lightheaded at times especially in the last couple days  EDEMA She is taking Lasix up to 40 mg twice a day for edema  Has less edema Reportedly, she is wearing her compression stockings Potassium has been low in the past on diuretics  Renal function: Creatinine has been variable and recently higher   Lab Results  Component Value Date   CREATININE 1.83 (H) 01/13/2016   BUN 30 (H) 01/13/2016   NA 141 01/13/2016   K 4.6 01/13/2016   CL 108 01/13/2016   CO2 26 01/13/2016    Lipids: Has been on long-term Lipitor with good control    Lab Results  Component Value Date   CHOL 162 09/16/2015   HDL 66.80 09/16/2015   LDLCALC 85 09/16/2015   TRIG 48.0 09/16/2015   CHOLHDL 2 09/16/2015    She has had vitamin D deficiency, Treated with 2000 units vitamin D 3  HYPOTHYROIDISM: She has had a multinodular goiter Levothyroxine supplements were stopped in 5/16 and she has continued to be euthyroid  Her recent labs show low normal TSH  Lab Results  Component Value Date   TSH 0.70 09/16/2015   TSH 0.71 06/17/2015   TSH 0.62 03/19/2015   FREET4 1.26 03/19/2015   FREET4 1.42 11/08/2013   FREET4 1.37 11/24/2012    Last foot exam in 7/17    Examination:   BP 114/60   Pulse (!) 54   Ht 5\' 6"  (1.676 m)   Wt 190 lb (86.2 kg)   SpO2 97%   BMI 30.67 kg/m   Body mass index is 30.67 kg/m.   She does not have any central obesity or supraclavicular fat pads No plethora of the face, thinning of the skin or excessive bruising of the skin  Trace ankle edema present  ASSESSMENT/ PLAN:   ADRENAL mass:  This is not new and has been present since at least  2011. Although it has been characterized as a heterogenous there has been no change in the size since 2011 on the CT scan Clinically does not have any features of pheochromocytoma, Cushing's disease or hyperaldosteronism No history of episodic severe hypertension, she has had mild stable long-standing hypertension without hypokalemia unless excessively diuresed  For completeness will have her get a dexamethasone suppression test and plasma catecholamines in about 4 weeks in the morning Do not think she needs an MRI as this will not change the evaluation process, will need to only rule out functional tumor   ORTHOSTATIC hypotension: This is likely to be related to recent infection, sepsis, low plasma volume and continued diuretics and antihypertensives. This is also contribute into renal dysfunction  Advised her to leave off the lisinopril completely for now and follow-up with PCP Discussed target blood sugar of 130-140 and advised her to follow-up with PCP in the next 2-3 weeks  Diabetes type 2    Blood glucose control is fairly good but now is starting to get mild hyperglycemia including a reading of 31 yesterday on the lab Most likely this is related to acute relation of Amaryl with renal dysfunction She has been stopped from taking this yesterday and will continue to monitor without treatment Discussed that the only other option in the future would be Januvia  Counseling time on subjects discussed above is over 50% of today's 25 minute visit     Jacarra Bobak 01/14/2016, 12:56 PM

## 2016-01-14 NOTE — Progress Notes (Signed)
Cancel MRI of abd.

## 2016-01-14 NOTE — Patient Instructions (Addendum)
Stop Lisinopril  Call if sugars >200  Check blood sugars on waking up 1-2x weekly   Also check blood sugars about 2 hours after a meal and do this after different meals by rotation  Recommended blood sugar levels on waking up is 90-130 and about 2 hours after meal is 130-160  Please bring your blood sugar monitor to each visit, thank you  See PCP in 3 weeks  Take dexamethasone nite before next visit

## 2016-01-16 ENCOUNTER — Ambulatory Visit: Payer: Medicare Other | Admitting: Endocrinology

## 2016-01-17 ENCOUNTER — Ambulatory Visit: Payer: Medicare Other | Admitting: Endocrinology

## 2016-01-19 LAB — CULTURE, BLOOD (SINGLE): Organism ID, Bacteria: NO GROWTH

## 2016-01-22 ENCOUNTER — Other Ambulatory Visit: Payer: Medicare Other

## 2016-01-24 ENCOUNTER — Other Ambulatory Visit: Payer: Self-pay | Admitting: Family Medicine

## 2016-02-12 ENCOUNTER — Ambulatory Visit (INDEPENDENT_AMBULATORY_CARE_PROVIDER_SITE_OTHER): Payer: Medicare Other | Admitting: Endocrinology

## 2016-02-12 ENCOUNTER — Encounter: Payer: Self-pay | Admitting: Endocrinology

## 2016-02-12 VITALS — BP 160/74 | HR 53 | Wt 191.0 lb

## 2016-02-12 DIAGNOSIS — E119 Type 2 diabetes mellitus without complications: Secondary | ICD-10-CM | POA: Diagnosis not present

## 2016-02-12 DIAGNOSIS — E279 Disorder of adrenal gland, unspecified: Secondary | ICD-10-CM | POA: Diagnosis not present

## 2016-02-12 DIAGNOSIS — E042 Nontoxic multinodular goiter: Secondary | ICD-10-CM

## 2016-02-12 DIAGNOSIS — I951 Orthostatic hypotension: Secondary | ICD-10-CM | POA: Diagnosis not present

## 2016-02-12 DIAGNOSIS — E278 Other specified disorders of adrenal gland: Secondary | ICD-10-CM

## 2016-02-12 LAB — CORTISOL: Cortisol, Plasma: 3.1 ug/dL

## 2016-02-12 LAB — BASIC METABOLIC PANEL
BUN: 25 mg/dL — ABNORMAL HIGH (ref 6–23)
CO2: 29 mEq/L (ref 19–32)
Calcium: 8.7 mg/dL (ref 8.4–10.5)
Chloride: 106 mEq/L (ref 96–112)
Creatinine, Ser: 1.45 mg/dL — ABNORMAL HIGH (ref 0.40–1.20)
GFR: 44.03 mL/min — ABNORMAL LOW (ref >60.00)
Glucose, Bld: 155 mg/dL — ABNORMAL HIGH (ref 70–99)
Potassium: 4.1 mEq/L (ref 3.5–5.1)
Sodium: 141 mEq/L (ref 135–145)

## 2016-02-12 NOTE — Progress Notes (Signed)
Please let patient know that the kidney test is better but not normal.  She can take half of the 100 mg Januvia daily Increase furosemide to 2 tablets to 3 tablets a day.  Also need to confirm what blood pressure medicines she is taking

## 2016-02-12 NOTE — Progress Notes (Signed)
Patient ID: Whitney [name], female   DOB: 07/18/1927, 82 y.o.   MRN: [id number]   Reason for Appointment: Diabetes and other problems  History of Present Illness    ADRENAL mass:  She had a CT scan done during her hospitalization and again was found to have a left adrenal mass   She was evaluated previously by a PET scan after  diagnosis of breast cancer in 2015 She was found to have a 3.7 cm hypermetabolic left adrenal mass On a previous CT scan in 2011 she also had a 3.8 cm adrenal mass Cortisol level is normal in 2015 and 2016 No history of hypokalemia or severe hypertension/palpitations, headaches, flushing or significant fluctuations in blood pressure  She is returning here for further evaluation with lab work this morning   Type 2 DIABETES MELITUS, date of diagnosis:  1983     Previous history: She had been taking metformin and Actos for several years.  She usually has had excellent control with upper normal A1c  Recent history:   Oral hypoglycemic drugs: Januvia 100 mg daily    She was previously on  Metformin and Actos; because of edema she was told to stop Actos Her metformin was stopped because of renal dysfunction Now she is on Januvia because of hypoglycemia with Amaryl 0.5 mg daily She does not know what her blood sugars are, she thinks she is checking them daily but did not bring her monitor  Her A1c usually lower than expected for her blood sugars, most recent 5%  Her granddaughter is checking her blood sugars mostly 1 hour after supper       Monitors blood glucose:   Once a day .    Glucometer: One Touch.          Blood Glucose readings from recall: 119-160  Physical activity: exercise: Only walking around the house       Diet: She Is avoiding fried and fast food, sometimes will have some light lemonade                Wt Readings from Last 3 Encounters:  02/12/16 191 lb (86.6 kg)  01/14/16 190 lb (86.2 kg)  01/13/16 189 lb (85.7 kg)     LABS:  Lab Results  Component Value Date   HGBA1C 5.3 09/16/2015   HGBA1C 5.3 06/17/2015   HGBA1C 5 03/19/2015   Lab Results  Component Value Date   MICROALBUR 6.7 (H) 09/16/2015   LDLCALC 85 09/16/2015   CREATININE 1.83 (H) 01/13/2016     OTHER problems  are discussed in review of systems        Medication List       Accurate as of 02/12/16  9:24 AM. Always use your most recent med list.          amiodarone 200 MG tablet Commonly known as:  PACERONE TAKE 1 TABLET BY MOUTH EVERY DAY   amLODipine 5 MG tablet Commonly known as:  NORVASC TAKE 1 TABLET (5 MG TOTAL) BY MOUTH DAILY.   atorvastatin 10 MG tablet Commonly known as:  LIPITOR TAKE 1 TABLET (10 MG TOTAL) BY MOUTH DAILY.   dexamethasone 1 MG tablet Commonly known as:  DECADRON Take 1 tablet at bedtime before test   doxazosin 4 MG tablet Commonly known as:  CARDURA Take 1 tablet (4 mg total) by mouth daily.   ELIQUIS 5 MG Tabs tablet Generic drug:  apixaban TAKE 1 TABLET TWICE A DAY   EX-LAX 15  MG Tabs Generic drug:  Sennosides Take 1 tablet by mouth daily as needed (CONSTIPATION).   furosemide 40 MG tablet Commonly known as:  LASIX Take 2 tablets (80 mg total) by mouth 2 (two) times daily.   glimepiride 1 MG tablet Commonly known as:  AMARYL TAKE 1/2 TABLET BY MOUTH TWICE A DAY   JANUVIA 100 MG tablet Generic drug:  sitaGLIPtin TAKE 1 TABLET EVERY DAY   KLOR-CON M20 20 MEQ tablet Generic drug:  potassium chloride SA TAKE ONE TABLET BY MOUTH ONCE DAILY   lisinopril 20 MG tablet Commonly known as:  PRINIVIL,ZESTRIL TAKE 1 TABLET BY MOUTH EVERY DAY   metoprolol succinate 100 MG 24 hr tablet Commonly known as:  TOPROL-XL TAKE 1 TABLET BY MOUTH TWICE A DAY WITH OR IMMEDIATELY FOLLOWING A MEAL   SYSTANE OP Place 1 drop into both eyes 3 (three) times daily.   tamoxifen 20 MG tablet Commonly known as:  NOLVADEX Take 1 tablet (20 mg total) by mouth daily.       Allergies:   Allergies  Allergen Reactions  . Tape Other (See Comments)    Skin is somewhat sensitive    Past Medical History:  Diagnosis Date  . Anemia   . Atrial fibrillation ([place])   . Breast cancer ([place]) 10/2013   right upper outer  . Cancer ([place])    right breast  . Complication of anesthesia    slow to wake up  . Diabetes mellitus without complication ([place])   . Dysrhythmia 10/15   AF  . Former smoker   . Full dentures   . Hyperlipidemia   . Hypertension   . Multinodular goiter   . Radiation    Right Breast  . Thyroid disease    hypothyroidism  . Wears glasses     Past Surgical History:  Procedure Laterality Date  . ABDOMINAL HYSTERECTOMY    . AXILLARY LYMPH NODE DISSECTION Right 01/09/2014   Procedure: RIGHT AXILLARY LYMPH NODE DISECTION;  Surgeon: [name], MD;  Location: [facility];  Service: General;  Laterality: Right;  . BREAST LUMPECTOMY WITH RADIOACTIVE SEED LOCALIZATION Right 01/09/2014   Procedure: RIGHT BREAST SEED LOCALIZED LUMPECTOMY ;  Surgeon: [name], MD;  Location: [place];  Service: General;  Laterality: Right;  . EYE SURGERY  12/22/2009   cataracts  . INTRAMEDULLARY (IM) NAIL INTERTROCHANTERIC Left 02/24/2014   Procedure: IM ROD LEFT HIP FX;  Surgeon: Alta Corning, MD;  Location: [place];  Service: Orthopedics;  Laterality: Left;  . PORT-A-CATH REMOVAL Right 01/09/2014   Procedure: REMOVAL PORT-A-CATH;  Surgeon: [name], MD;  Location: [place];  Service: General;  Laterality: Right;  . PORTACATH PLACEMENT N/A 11/15/2013   Procedure: INSERTION PORT-A-CATH WITH ULTRA SOUND AND FLOROSCOPY;  Surgeon: [name], MD;  Location: [place];  Service: General;  Laterality: N/A;  . TOTAL KNEE ARTHROPLASTY  2002   left    No family history on file.  Social History:  reports that she quit smoking about 57 years ago. She has a 5.00 pack-year smoking history. She has never used smokeless tobacco. She  reports that she does not drink alcohol or use drugs.  Review of Systems:  Hypertension:  She is now taking amlodipine 5 mg daily And also metoprolol and followed by primary care No lightheadedness Does not monitor at home  EDEMA She is taking Lasix up to 80 mg a day for edema  She does tend to have some  edema recently  Again she is wearing her compression stockings Potassium has been low in the past on diuretics  Renal function: Creatinine has been variable and recently higher   Lab Results  Component Value Date   CREATININE 1.83 (H) 01/13/2016   BUN 30 (H) 01/13/2016   NA 141 01/13/2016   K 4.6 01/13/2016   CL 108 01/13/2016   CO2 26 01/13/2016    Lipids: Has been on long-term Lipitor with good control    Lab Results  Component Value Date   CHOL 162 09/16/2015   HDL 66.80 09/16/2015   LDLCALC 85 09/16/2015   TRIG 48.0 09/16/2015   CHOLHDL 2 09/16/2015    She has had vitamin D deficiency, Treated with 2000 units vitamin D 3  HYPOTHYROIDISM: She has had a multinodular goiter Levothyroxine supplements were stopped in 5/16 and she has continued to be euthyroid  Her  labs show low normal TSH  Lab Results  Component Value Date   TSH 0.70 09/16/2015   TSH 0.71 06/17/2015   TSH 0.62 03/19/2015   FREET4 1.26 03/19/2015   FREET4 1.42 11/08/2013   FREET4 1.37 11/24/2012    Last foot exam in 7/17    Examination:   BP (!) 160/74 (Cuff Size: Large)   Pulse (!) 53   Wt 191 lb (86.6 kg)   SpO2 96%   BMI 30.83 kg/m   Body mass index is 30.83 kg/m.   She does Have 1+ edema  ASSESSMENT/ PLAN:   ADRENAL mass:  She is getting a dexamethasone suppression test and plasma catecholamines today  HYPERTENSION: Blood pressure is relatively high with stopping lisinopril Will need to evaluate her renal function before deciding on further treatment   Diabetes type 2   Blood glucose reportedly fairly good at home considering her age on Januvia, no hypoglycemia  with stopping Amaryl If her renal function is abnormal she can take 50 mg of Januvia Lab glucose pending    Willella Harding 02/12/2016, 9:24 AM

## 2016-02-16 LAB — METANEPHRINES, PLASMA
Metanephrine, Free: 61 pg/mL (ref 0–62)
Normetanephrine, Free: 103 pg/mL (ref 0–145)

## 2016-02-18 ENCOUNTER — Other Ambulatory Visit: Payer: Self-pay | Admitting: Endocrinology

## 2016-03-17 ENCOUNTER — Other Ambulatory Visit: Payer: Self-pay | Admitting: Family Medicine

## 2016-03-20 ENCOUNTER — Other Ambulatory Visit: Payer: Self-pay | Admitting: Nurse Practitioner

## 2016-03-25 ENCOUNTER — Other Ambulatory Visit: Payer: Self-pay | Admitting: Family Medicine

## 2016-03-26 ENCOUNTER — Ambulatory Visit (INDEPENDENT_AMBULATORY_CARE_PROVIDER_SITE_OTHER): Payer: Medicare Other | Admitting: Endocrinology

## 2016-03-26 ENCOUNTER — Encounter: Payer: Self-pay | Admitting: Endocrinology

## 2016-03-26 VITALS — BP 148/80 | HR 53 | Ht 66.0 in | Wt 191.0 lb

## 2016-03-26 DIAGNOSIS — E1165 Type 2 diabetes mellitus with hyperglycemia: Secondary | ICD-10-CM | POA: Diagnosis not present

## 2016-03-26 DIAGNOSIS — E042 Nontoxic multinodular goiter: Secondary | ICD-10-CM | POA: Diagnosis not present

## 2016-03-26 DIAGNOSIS — E119 Type 2 diabetes mellitus without complications: Secondary | ICD-10-CM | POA: Diagnosis not present

## 2016-03-26 LAB — COMPREHENSIVE METABOLIC PANEL
ALT: 15 U/L (ref 0–35)
AST: 19 U/L (ref 0–37)
Albumin: 3.5 g/dL (ref 3.5–5.2)
Alkaline Phosphatase: 52 U/L (ref 39–117)
BUN: 32 mg/dL — ABNORMAL HIGH (ref 6–23)
CO2: 31 mEq/L (ref 19–32)
Calcium: 8.9 mg/dL (ref 8.4–10.5)
Chloride: 105 mEq/L (ref 96–112)
Creatinine, Ser: 1.43 mg/dL — ABNORMAL HIGH (ref 0.40–1.20)
GFR: 44.73 mL/min — ABNORMAL LOW (ref >60.00)
Glucose, Bld: 133 mg/dL — ABNORMAL HIGH (ref 70–99)
Potassium: 3.7 mEq/L (ref 3.5–5.1)
Sodium: 142 mEq/L (ref 135–145)
Total Bilirubin: 0.5 mg/dL (ref 0.2–1.2)
Total Protein: 6.4 g/dL (ref 6.0–8.3)

## 2016-03-26 LAB — HEMOGLOBIN A1C: Hgb A1c MFr Bld: 6.2 % (ref 4.6–6.5)

## 2016-03-26 LAB — TSH: TSH: 0.54 u[IU]/mL (ref 0.35–4.50)

## 2016-03-26 NOTE — Patient Instructions (Signed)
Walk as much as possible  Take 2 fluid pills 2x daily

## 2016-03-26 NOTE — Progress Notes (Signed)
Patient ID: Whitney [name], female   DOB: [birth year], 83 y.o.   MRN: [id number]   Reason for Appointment: Diabetes and other problems  History of Present Illness    ADRENAL mass:  She had a CT scan done during her hospitalization and again was found to have a left adrenal mass  She was evaluated previously by a PET scan after  diagnosis of breast cancer in 2015 She was found to have a 3.7 cm hypermetabolic left adrenal mass On a previous CT scan in 2011 she also had a 3.8 cm adrenal mass Cortisol level is normal in 2015 and 2016 No history of hypokalemia or severe hypertension/palpitations, headaches, flushing or significant fluctuations in blood pressure  Recent evaluation with metanephrines and overnight dexamethasone test indicates normal adrenal function She again does not look cushingoid on exam     Type 2 DIABETES MELITUS, date of diagnosis:  1983     Previous history: She had been taking metformin and Actos for several years.  She usually has had excellent control with upper normal A1c  Recent history:   Oral hypoglycemic drugs: Januvia 50 mg daily    She was previously on  Metformin and Actos; because of edema she was told to stop Actos Her metformin was stopped because of renal dysfunction Now she is on Januvia because of hypoglycemia with Amaryl 0.5 mg   Recently she is checking blood sugars mostly after her evening meal and these are ranging from 148-209, median about 165, today after breakfast was 174  Her A1c usually lower than expected for her blood sugars, most recent 5.3%        Monitors blood glucose:   Once a day .    Glucometer: One Touch.          Blood Glucose readings as above  Physical activity: exercise: Minimal  Diet: She Is avoiding fried and fast food, sometimes will have some light lemonade                Wt Readings from Last 3 Encounters:  03/26/16 191 lb (86.6 kg)  02/12/16 191 lb (86.6 kg)  01/14/16 190 lb (86.2 kg)     LABS:  Lab Results  Component Value Date   HGBA1C 6.2 03/26/2016   HGBA1C 5.3 09/16/2015   HGBA1C 5.3 06/17/2015   Lab Results  Component Value Date   MICROALBUR 6.7 (H) 09/16/2015   LDLCALC 85 09/16/2015   CREATININE 1.43 (H) 03/26/2016     OTHER problems  are discussed in review of systems      Allergies as of 03/26/2016      Reactions   Tape Other (See Comments)   Skin is somewhat sensitive      Medication List       Accurate as of 03/26/16  9:01 PM. Always use your most recent med list.          amiodarone 200 MG tablet Commonly known as:  PACERONE TAKE 1 TABLET BY MOUTH EVERY DAY   amLODipine 5 MG tablet Commonly known as:  NORVASC TAKE 1 TABLET (5 MG TOTAL) BY MOUTH DAILY.   atorvastatin 10 MG tablet Commonly known as:  LIPITOR TAKE 1 TABLET (10 MG TOTAL) BY MOUTH DAILY.   doxazosin 4 MG tablet Commonly known as:  CARDURA Take 1 tablet (4 mg total) by mouth daily.   ELIQUIS 5 MG Tabs tablet Generic drug:  apixaban TAKE 1 TABLET TWICE A DAY   EX-LAX 15 MG Tabs  Generic drug:  Sennosides Take 1 tablet by mouth daily as needed (CONSTIPATION).   furosemide 40 MG tablet Commonly known as:  LASIX Take 2 tablets (80 mg total) by mouth 2 (two) times daily.   JANUVIA 100 MG tablet Generic drug:  sitaGLIPtin TAKE 1 TABLET EVERY DAY   KLOR-CON M20 20 MEQ tablet Generic drug:  potassium chloride SA TAKE ONE TABLET BY MOUTH ONCE DAILY   lisinopril 20 MG tablet Commonly known as:  PRINIVIL,ZESTRIL TAKE 1 TABLET BY MOUTH EVERY DAY   metoprolol succinate 100 MG 24 hr tablet Commonly known as:  TOPROL-XL TAKE 1 TABLET BY MOUTH TWICE A DAY WITH OR IMMEDIATELY FOLLOWING A MEAL   SYSTANE OP Place 1 drop into both eyes 3 (three) times daily.   tamoxifen 20 MG tablet Commonly known as:  NOLVADEX Take 1 tablet (20 mg total) by mouth daily.       Allergies:  Allergies  Allergen Reactions  . Tape Other (See Comments)    Skin is somewhat  sensitive    Past Medical History:  Diagnosis Date  . Anemia   . Atrial fibrillation ([place])   . Breast cancer ([place]) 10/2013   right upper outer  . Cancer ([place])    right breast  . Complication of anesthesia    slow to wake up  . Diabetes mellitus without complication ([place])   . Dysrhythmia 10/15   AF  . Former smoker   . Full dentures   . Hyperlipidemia   . Hypertension   . Multinodular goiter   . Radiation    Right Breast  . Thyroid disease    hypothyroidism  . Wears glasses     Past Surgical History:  Procedure Laterality Date  . ABDOMINAL HYSTERECTOMY    . AXILLARY LYMPH NODE DISSECTION Right 01/09/2014   Procedure: RIGHT AXILLARY LYMPH NODE DISECTION;  Surgeon: [name], MD;  Location: [facility];  Service: General;  Laterality: Right;  . BREAST LUMPECTOMY WITH RADIOACTIVE SEED LOCALIZATION Right 01/09/2014   Procedure: RIGHT BREAST SEED LOCALIZED LUMPECTOMY ;  Surgeon: [name], MD;  Location: [facility];  Service: General;  Laterality: Right;  . EYE SURGERY  12/22/2009   cataracts  . INTRAMEDULLARY (IM) NAIL INTERTROCHANTERIC Left 02/24/2014   Procedure: IM ROD LEFT HIP FX;  Surgeon: Alta Corning, MD;  Location: [facility];  Service: Orthopedics;  Laterality: Left;  . PORT-A-CATH REMOVAL Right 01/09/2014   Procedure: REMOVAL PORT-A-CATH;  Surgeon: [name], MD;  Location: [facility];  Service: General;  Laterality: Right;  . PORTACATH PLACEMENT N/A 11/15/2013   Procedure: INSERTION PORT-A-CATH WITH ULTRA SOUND AND FLOROSCOPY;  Surgeon: [name], MD;  Location: [facility];  Service: General;  Laterality: N/A;  . TOTAL KNEE ARTHROPLASTY  2002   left    No family history on file.  Social History:  reports that she quit smoking about 57 years ago. She has a 5.00 pack-year smoking history. She has never used smokeless tobacco. She reports that she does not drink alcohol or use drugs.  Review of  Systems:  Hypertension:  She is  taking amlodipine 5 mg daily, 4 mg doxazosin, 20 mg lisinopril And also metoprolol  She has not followed up with PCP recently  Does not monitor at home  EDEMA She is taking Lasix 120 mg daily for edema, prescribed by PCP She still has had swelling of her legs and does sometimes try to wear elastic stockings or elevate her  feet  Potassium has been low in the past on diuretics  Renal function: Creatinine has been variable    Lab Results  Component Value Date   CREATININE 1.43 (H) 03/26/2016   BUN 32 (H) 03/26/2016   NA 142 03/26/2016   K 3.7 03/26/2016   CL 105 03/26/2016   CO2 31 03/26/2016    Lipids: Has been on long-term Lipitor with good control    Lab Results  Component Value Date   CHOL 162 09/16/2015   HDL 66.80 09/16/2015   LDLCALC 85 09/16/2015   TRIG 48.0 09/16/2015   CHOLHDL 2 09/16/2015    She has had vitamin D deficiency, Treated with 2000 units vitamin D 3  HYPOTHYROIDISM: She has had a multinodular goiter Levothyroxine supplements were stopped in 5/16 and she has continued to be euthyroid  Her  labs show low normal TSH  Lab Results  Component Value Date   TSH 0.54 03/26/2016   TSH 0.70 09/16/2015   TSH 0.71 06/17/2015   FREET4 1.26 03/19/2015   FREET4 1.42 11/08/2013   FREET4 1.37 11/24/2012    Last foot exam in 7/17    Examination:   BP (!) 148/80   Pulse (!) 53   Ht 5\' 6"  (1.676 m)   Wt 191 lb (86.6 kg)   SpO2 95%   BMI 30.83 kg/m   Body mass index is 30.83 kg/m.   2+ edema of lower legs present  ASSESSMENT/ PLAN:   ADRENAL mass: Has not changed in size and is not functional as per recent evaluation   HYPERTENSION: Blood pressure is better with starting back on lisinopril and renal function need to be assessed Now requiring multiple drugs Recommended follow-up with PCP  EDEMA: Still present and she can increase her Lasix to 80 mg twice a day now   Diabetes type 2   See history of  present illness for  discussion of current diabetes management, blood sugar patterns and problems identified She has reasonable controlled with Januvia 50 mg daily Post prandial readings are averaging about 170 which is adequate for her age A1c is lower than expected for her glucose because of renal function ?      Nazaria Riesen 03/26/2016, 9:01 PM

## 2016-03-26 NOTE — Progress Notes (Signed)
Please let patient know that the lab result is stable and no further action needed

## 2016-03-27 LAB — FRUCTOSAMINE: Fructosamine: 306 umol/L — ABNORMAL HIGH (ref 0–285)

## 2016-04-15 ENCOUNTER — Other Ambulatory Visit: Payer: Self-pay | Admitting: Endocrinology

## 2016-04-18 ENCOUNTER — Other Ambulatory Visit: Payer: Self-pay | Admitting: Family Medicine

## 2016-04-20 ENCOUNTER — Encounter: Payer: Self-pay | Admitting: Hematology and Oncology

## 2016-04-20 ENCOUNTER — Ambulatory Visit (HOSPITAL_BASED_OUTPATIENT_CLINIC_OR_DEPARTMENT_OTHER): Payer: Medicare Other | Admitting: Hematology and Oncology

## 2016-04-20 DIAGNOSIS — Z170 Estrogen receptor positive status [ER+]: Secondary | ICD-10-CM

## 2016-04-20 DIAGNOSIS — R609 Edema, unspecified: Secondary | ICD-10-CM

## 2016-04-20 DIAGNOSIS — C50411 Malignant neoplasm of upper-outer quadrant of right female breast: Secondary | ICD-10-CM | POA: Diagnosis not present

## 2016-04-20 NOTE — Progress Notes (Signed)
Patient Care Team: [name], MD as PCP - General (Family Medicine) Nicholas Lose, MD as Consulting Physician (Hematology and Oncology) [name], MD as Consulting Physician (General Surgery) [name], MD as Consulting Physician (Radiation Oncology)  DIAGNOSIS:  Encounter Diagnosis  Name Primary?  . Malignant neoplasm of upper-outer quadrant of right breast in female, estrogen receptor positive ([place])     SUMMARY OF ONCOLOGIC HISTORY:   Breast cancer of upper-outer quadrant of right female breast ([place])   10/20/2013 Mammogram    Ultrasound and mammogram showed 2.1 x 2.1 x 1.9 cm right breast mass      10/20/2013 Initial Diagnosis    Breast cancer of upper-outer quadrant of right female breast. Invasive ductal cancer with lymphovascular invasion one lymph node biopsy that was positive for cancer grade 1; Her 2 Neg Ratio 0.96, ER100% PR 8% positive Ki 67: 11%;      10/31/2013 Breast MRI    Right breast upper outer quadrant: 3.1 x 1.9 x 2.2 cm: Right axillary lymph node 1.1 x 0.9 x 0.6 cm      11/16/2013 - 12/01/2013 Chemotherapy    Neoadjuvant dose dense Doxorubicin and Cyclophosphamide given on day 1 of a 14 day cycle with Neulasta given on day 2 for granulocyte support      01/09/2014 Surgery    Right breast lumpectomy: Invasive ductal carcinoma, grade 2, 1.8 cm with DCIS intermediate grade, lymphovascular invasion diffusely, 5 of 13 lymph nodes positive with extracapsular extension, ER positive, PR 80%, HER-2 negative ratio 0.96, Ki-67 11%      02/25/2014 - 02/28/2014 Hospital Admission    Left hip fracture as a result of a fall at home      05/15/2014 - 06/29/2014 Radiation Therapy    Adjuvant radiation therapy with Dr. Pablo Ledger      07/20/2014 -  Anti-estrogen oral therapy    Tamoxifen 20 mg daily       CHIEF COMPLIANT: Follow-up on tamoxifen therapy  INTERVAL HISTORY: Whitney [name] is a 83 year old with above-mentioned history of right breast cancer  treated with lumpectomy and adjuvant radiation. She is currently on tamoxifen. She is tolerating tamoxifen extremely well. She denies any hot flashes. She is not very active but is able to get around without any difficulty. She continues to have leg swelling. She denies any lumps or nodules in the breast.  REVIEW OF SYSTEMS:   Constitutional: Denies fevers, chills or abnormal weight loss; hearing impairment6 Eyes: Denies blurriness of vision Ears, nose, mouth, throat, and face: Denies mucositis or sore throat Respiratory: Denies cough, dyspnea or wheezes Cardiovascular: Denies palpitation, chest discomfort Gastrointestinal:  Denies nausea, heartburn or change in bowel habits Skin: Denies abnormal skin rashes Lymphatics: Denies new lymphadenopathy or easy bruising Neurological:Denies numbness, tingling or new weaknesses Behavioral/Psych: Mood is stable, no new changes  Extremities: No lower extremity edema Breast:  denies any pain or lumps or nodules in either breasts All other systems were reviewed with the patient and are negative.  I have reviewed the past medical history, past surgical history, social history and family history with the patient and they are unchanged from previous note.  ALLERGIES:  is allergic to tape.  MEDICATIONS:  Current Outpatient Prescriptions  Medication Sig Dispense Refill  . amiodarone (PACERONE) 200 MG tablet TAKE 1 TABLET BY MOUTH EVERY DAY 30 tablet 5  . amLODipine (NORVASC) 5 MG tablet TAKE 1 TABLET (5 MG TOTAL) BY MOUTH DAILY. 30 tablet 3  . atorvastatin (LIPITOR) 10 MG  tablet TAKE 1 TABLET (10 MG TOTAL) BY MOUTH DAILY. 90 tablet 1  . doxazosin (CARDURA) 4 MG tablet Take 1 tablet (4 mg total) by mouth daily. 90 tablet 2  . ELIQUIS 5 MG TABS tablet TAKE 1 TABLET TWICE A DAY (Patient taking differently: Take 5 mg by mouth two times a day) 60 tablet 11  . furosemide (LASIX) 40 MG tablet Take 2 tablets (80 mg total) by mouth 2 (two) times daily. 360 tablet  2  . glimepiride (AMARYL) 1 MG tablet TAKE 1/2 TABLET BY MOUTH TWICE A DAY 30 tablet 3  . JANUVIA 100 MG tablet TAKE 1 TABLET EVERY DAY 30 tablet 3  . KLOR-CON M20 20 MEQ tablet TAKE ONE TABLET BY MOUTH ONCE DAILY 30 tablet 3  . lisinopril (PRINIVIL,ZESTRIL) 20 MG tablet TAKE 1 TABLET BY MOUTH EVERY DAY 30 tablet 11  . metoprolol succinate (TOPROL-XL) 100 MG 24 hr tablet TAKE 1 TABLET BY MOUTH TWICE A DAY WITH OR IMMEDIATELY FOLLOWING A MEAL (Patient taking differently: Take 100 mg by mouth 2 (two) times daily. WITH OR IMMEDIATELY FOLLOWING A MEAL) 180 tablet 1  . Polyethyl Glycol-Propyl Glycol (SYSTANE OP) Place 1 drop into both eyes 3 (three) times daily.    . Sennosides (EX-LAX) 15 MG TABS Take 1 tablet by mouth daily as needed (CONSTIPATION).    Marland Kitchen tamoxifen (NOLVADEX) 20 MG tablet Take 1 tablet (20 mg total) by mouth daily. 90 tablet 3   No current facility-administered medications for this visit.     PHYSICAL EXAMINATION: ECOG PERFORMANCE STATUS: 1 - Symptomatic but completely ambulatory  Vitals:   04/20/16 1318  BP: (!) 151/67  Pulse: (!) 55  Resp: 17  Temp: 98.2 F (36.8 C)   Filed Weights   04/20/16 1318  Weight: 194 lb 1.6 oz (88 kg)    GENERAL:alert, no distress and comfortable SKIN: skin color, texture, turgor are normal, no rashes or significant lesions EYES: normal, Conjunctiva are pink and non-injected, sclera clear OROPHARYNX:no exudate, no erythema and lips, buccal mucosa, and tongue normal  NECK: supple, thyroid normal size, non-tender, without nodularity LYMPH:  no palpable lymphadenopathy in the cervical, axillary or inguinal LUNGS: clear to auscultation and percussion with normal breathing effort HEART: regular rate & rhythm and no murmurs and no lower extremity edema ABDOMEN:abdomen soft, non-tender and normal bowel sounds MUSCULOSKELETAL:no cyanosis of digits and no clubbing  NEURO: alert & oriented x 3 with fluent speech, no focal motor/sensory  deficits EXTREMITIES: No lower extremity edema BREASTRight breast mild lymphedema and swelling on scar tissue from prior surgery and radiation. No palpable lumps or nodules in the left breast.  No palpable axillary supraclavicular or infraclavicular adenopathy no breast tenderness or nipple discharge. (exam performed in the presence of a chaperone)  LABORATORY DATA:  I have reviewed the data as listed   Chemistry      Component Value Date/Time   NA 142 03/26/2016 1044   NA 141 12/22/2013 1000   K 3.7 03/26/2016 1044   K 3.1 (L) 12/22/2013 1000   CL 105 03/26/2016 1044   CO2 31 03/26/2016 1044   CO2 30 (H) 12/22/2013 1000   BUN 32 (H) 03/26/2016 1044   BUN 26.0 12/22/2013 1000   CREATININE 1.43 (H) 03/26/2016 1044   CREATININE 1.83 (H) 01/13/2016 1421   CREATININE 1.3 (H) 12/22/2013 1000      Component Value Date/Time   CALCIUM 8.9 03/26/2016 1044   CALCIUM 10.0 12/22/2013 1000   ALKPHOS 52 03/26/2016  1044   ALKPHOS 66 12/22/2013 1000   AST 19 03/26/2016 1044   AST 11 12/22/2013 1000   ALT 15 03/26/2016 1044   ALT 8 12/22/2013 1000   BILITOT 0.5 03/26/2016 1044   BILITOT 0.29 12/22/2013 1000       Lab Results  Component Value Date   WBC 5.6 01/13/2016   HGB 10.9 (L) 01/13/2016   HCT 34.2 (L) 01/13/2016   MCV 91.7 01/13/2016   PLT 209 01/13/2016   NEUTROABS 4,088 01/13/2016    ASSESSMENT & PLAN:  Breast cancer of upper-outer quadrant of right female breast ([place]) T1 CN2A M0 stage IIIa invasive ductal carcinoma right breast status post lumpectomy 5/13 lymph nodes positive with lymphovascular invasion and extracapsular extension. ER positive PR positive HER-2 negative Ki-67 11%. Patient was hospitalized for left hip fracture 02/25/2014, completed adjuvant radiation therapy 06/29/14, Started Tamoxifen 07/20/14 Hospitalization 12/30/2015: Escherichia colibacteremia Tamoxifen Toxicities:no toxicities to tamoxifen. Denies any hot flashes or myalgias.  Breast Cancer  Surveillance: 1. Breast exam 04/20/2016: no lumps or nodules or any concerns for breast cancer. 2. Mammogram 11/01/15: No breast cancer, post-op changes density B  Leg edema: Instructed the patient to elevate her legs. She is currently on Lasix Mild chronic kidney disease: This is being monitored by her primary care physician. CT abdomen 01/02/2016: Diverticulitis, 3.6 cm left adrenal adenoma  RTC in one year For surveillance and follow-up.   I spent 25 minutes talking to the patient of which more than half was spent in counseling and coordination of care.  No orders of the defined types were placed in this encounter.  The patient has a good understanding of the overall plan. she agrees with it. she will call with any problems that may develop before the next visit here.   [name], MD 04/20/16

## 2016-04-20 NOTE — Assessment & Plan Note (Signed)
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 ER positive PR positive HER-2 negative Ki-67 11%. Patient was hospitalized for left hip fracture 02/25/2014, completed adjuvant radiation therapy 06/29/14, Started Tamoxifen 07/20/14 Hospitalization 12/30/2015: Escherichia colibacteremia Tamoxifen Toxicities:no toxicities to tamoxifen. Denies any hot flashes or myalgias.  Breast Cancer Surveillance: 1. Breast exam 04/20/2016: no lumps or nodules or any concerns for breast cancer. 2. Mammogram 11/01/15: No breast cancer, post-op changes density B  Leg edema: I discussed with her the importance of exercise in decreasing the leg swelling. She is currently on Lasix Mild chronic kidney disease: This is being monitored by her primary care physician. CT abdomen 01/02/2016: Diverticulitis, 3.6 cm left adrenal adenoma  RTC in one year For surveillance and follow-up.

## 2016-05-11 ENCOUNTER — Other Ambulatory Visit: Payer: Self-pay | Admitting: Endocrinology

## 2016-05-28 ENCOUNTER — Other Ambulatory Visit: Payer: Self-pay | Admitting: Endocrinology

## 2016-06-11 ENCOUNTER — Other Ambulatory Visit: Payer: Self-pay | Admitting: Endocrinology

## 2016-06-13 ENCOUNTER — Other Ambulatory Visit: Payer: Self-pay | Admitting: Endocrinology

## 2016-06-24 ENCOUNTER — Ambulatory Visit (INDEPENDENT_AMBULATORY_CARE_PROVIDER_SITE_OTHER): Payer: Medicare Other | Admitting: Endocrinology

## 2016-06-24 ENCOUNTER — Encounter: Payer: Self-pay | Admitting: Endocrinology

## 2016-06-24 VITALS — BP 138/58 | HR 62 | Ht 66.0 in | Wt 191.0 lb

## 2016-06-24 DIAGNOSIS — E1165 Type 2 diabetes mellitus with hyperglycemia: Secondary | ICD-10-CM | POA: Diagnosis not present

## 2016-06-24 DIAGNOSIS — E042 Nontoxic multinodular goiter: Secondary | ICD-10-CM | POA: Diagnosis not present

## 2016-06-24 LAB — BASIC METABOLIC PANEL
BUN: 34 mg/dL — ABNORMAL HIGH (ref 6–23)
CO2: 29 mEq/L (ref 19–32)
Calcium: 9 mg/dL (ref 8.4–10.5)
Chloride: 104 mEq/L (ref 96–112)
Creatinine, Ser: 1.59 mg/dL — ABNORMAL HIGH (ref 0.40–1.20)
GFR: 39.56 mL/min — ABNORMAL LOW (ref >60.00)
Glucose, Bld: 124 mg/dL — ABNORMAL HIGH (ref 70–99)
Potassium: 3.6 mEq/L (ref 3.5–5.1)
Sodium: 140 mEq/L (ref 135–145)

## 2016-06-24 LAB — TSH: TSH: 0.53 u[IU]/mL (ref 0.35–4.50)

## 2016-06-24 LAB — HEMOGLOBIN A1C: Hgb A1c MFr Bld: 6.3 % (ref 4.6–6.5)

## 2016-06-24 LAB — T4, FREE: Free T4: 1.27 ng/dL (ref 0.60–1.60)

## 2016-06-24 NOTE — Patient Instructions (Signed)
Check blood sugars on waking up  weekly  Also check blood sugars about 2-3 hours after a meal and do this after different meals by rotation  Recommended blood sugar levels on waking up is 90-130 and about 2 hours after meal is 130-160  Please bring your blood sugar monitor to each visit, thank you

## 2016-06-24 NOTE — Progress Notes (Signed)
Patient ID: Whitney [name], female   DOB: 02-27-1928, 83 y.o.   MRN: [id number]   Reason for Appointment: Diabetes and other problems  History of Present Illness     Type 2 DIABETES MELITUS, date of diagnosis:  1983     Previous history: She had been taking metformin and Actos for several years.  She usually has had excellent control with upper normal A1c  Recent history:   Oral hypoglycemic drugs: Januvia 50 mg daily    She was previously on  Metformin and Actos; because of edema she was told to stop Actos Her metformin was stopped because of renal dysfunction Now she is on Januvia because of hypoglycemia with Amaryl 0.5 mg  No recent hypoglycemia with this Recently she is checking blood sugars mostly after her evening meal and these are ranging from 110-214 with MEDIAN 156  Her A1c usually lower than expected for her blood sugars, most recent 6.2 %        Monitors blood glucose:   less than once a day .    Glucometer: One Touch.           Physical activity: exercise: Minimal  Diet: She Is avoiding fried and fast food, sometimes will have some light lemonade                Wt Readings from Last 3 Encounters:  06/24/16 191 lb (86.6 kg)  04/20/16 194 lb 1.6 oz (88 kg)  03/26/16 191 lb (86.6 kg)    LABS:  Lab Results  Component Value Date   HGBA1C 6.2 03/26/2016   HGBA1C 5.3 09/16/2015   HGBA1C 5.3 06/17/2015   Lab Results  Component Value Date   MICROALBUR 6.7 (H) 09/16/2015   LDLCALC 85 09/16/2015   CREATININE 1.43 (H) 03/26/2016    Hypertension:  She is  taking amlodipine 5 mg daily, 4 mg doxazosin, 20 mg lisinopril And 100 mg of metoprolol  She has not followed up with PCP recently  Does not monitor at home  EDEMA She is taking Lasix 160 mg daily for edema, prescribed by PCP She still has had swelling of her legs although less than before She does not try to wear elastic stockings because of discomfort By    OTHER problems  are  discussed in review of systems      Allergies as of 06/24/2016      Reactions   Tape Other (See Comments)   Skin is somewhat sensitive      Medication List       Accurate as of 06/24/16  9:31 AM. Always use your most recent med list.          amiodarone 200 MG tablet Commonly known as:  PACERONE TAKE 1 TABLET BY MOUTH EVERY DAY   amLODipine 5 MG tablet Commonly known as:  NORVASC TAKE 1 TABLET (5 MG TOTAL) BY MOUTH DAILY.   atorvastatin 10 MG tablet Commonly known as:  LIPITOR TAKE 1 TABLET (10 MG TOTAL) BY MOUTH DAILY.   doxazosin 4 MG tablet Commonly known as:  CARDURA Take 1 tablet (4 mg total) by mouth daily.   ELIQUIS 5 MG Tabs tablet Generic drug:  apixaban TAKE 1 TABLET TWICE A DAY   EX-LAX 15 MG Tabs Generic drug:  Sennosides Take 1 tablet by mouth daily as needed (CONSTIPATION).   furosemide 40 MG tablet Commonly known as:  LASIX Take 2 tablets (80 mg total) by mouth 2 (two) times daily.  glimepiride 1 MG tablet Commonly known as:  AMARYL TAKE 1/2 TABLET BY MOUTH TWICE A DAY   JANUVIA 100 MG tablet Generic drug:  sitaGLIPtin TAKE 1 TABLET EVERY DAY   KLOR-CON M20 20 MEQ tablet Generic drug:  potassium chloride SA TAKE ONE TABLET BY MOUTH ONCE DAILY   lisinopril 20 MG tablet Commonly known as:  PRINIVIL,ZESTRIL TAKE 1 TABLET BY MOUTH EVERY DAY   metoprolol succinate 100 MG 24 hr tablet Commonly known as:  TOPROL-XL TAKE 1 TABLET BY MOUTH TWICE A DAY WITH OR IMMEDIATELY FOLLOWING A MEAL   ONETOUCH VERIO test strip Generic drug:  glucose blood CHECK BLOOD SUGAR EVERY DAY AS DIRECTED   SYSTANE OP Place 1 drop into both eyes 3 (three) times daily.   tamoxifen 20 MG tablet Commonly known as:  NOLVADEX Take 1 tablet (20 mg total) by mouth daily.       Allergies:  Allergies  Allergen Reactions  . Tape Other (See Comments)    Skin is somewhat sensitive    Past Medical History:  Diagnosis Date  . Anemia   . Atrial fibrillation  ([place])   . Breast cancer ([place]) 10/2013   right upper outer  . Cancer ([place])    right breast  . Complication of anesthesia    slow to wake up  . Diabetes mellitus without complication ([place])   . Dysrhythmia 10/15   AF  . Former smoker   . Full dentures   . Hyperlipidemia   . Hypertension   . Multinodular goiter   . Radiation    Right Breast  . Thyroid disease    hypothyroidism  . Wears glasses     Past Surgical History:  Procedure Laterality Date  . ABDOMINAL HYSTERECTOMY    . AXILLARY LYMPH NODE DISSECTION Right 01/09/2014   Procedure: RIGHT AXILLARY LYMPH NODE DISECTION;  Surgeon: [name], MD;  Location: [facility];  Service: General;  Laterality: Right;  . BREAST LUMPECTOMY WITH RADIOACTIVE SEED LOCALIZATION Right 01/09/2014   Procedure: RIGHT BREAST SEED LOCALIZED LUMPECTOMY ;  Surgeon: [name], MD;  Location: [facility];  Service: General;  Laterality: Right;  . EYE SURGERY  12/22/2009   cataracts  . INTRAMEDULLARY (IM) NAIL INTERTROCHANTERIC Left 02/24/2014   Procedure: IM ROD LEFT HIP FX;  Surgeon: Alta Corning, MD;  Location: [facility];  Service: Orthopedics;  Laterality: Left;  . PORT-A-CATH REMOVAL Right 01/09/2014   Procedure: REMOVAL PORT-A-CATH;  Surgeon: [name], MD;  Location: [facility];  Service: General;  Laterality: Right;  . PORTACATH PLACEMENT N/A 11/15/2013   Procedure: INSERTION PORT-A-CATH WITH ULTRA SOUND AND FLOROSCOPY;  Surgeon: [name], MD;  Location: [facility];  Service: General;  Laterality: N/A;  . TOTAL KNEE ARTHROPLASTY  2002   left    No family history on file.  Social History:  reports that she quit smoking about 57 years ago. She has a 5.00 pack-year smoking history. She has never used smokeless tobacco. She reports that she does not drink alcohol or use drugs.  Review of Systems:   Renal function: Creatinine has been variable, Recently near normal    Lab Results    Component Value Date   CREATININE 1.43 (H) 03/26/2016   BUN 32 (H) 03/26/2016   NA 142 03/26/2016   K 3.7 03/26/2016   CL 105 03/26/2016   CO2 31 03/26/2016    Lipids: Has been on long-term Lipitor with good control    Lab  Results  Component Value Date   CHOL 162 09/16/2015   HDL 66.80 09/16/2015   LDLCALC 85 09/16/2015   TRIG 48.0 09/16/2015   CHOLHDL 2 09/16/2015    She has had vitamin D deficiency, Treated with 2000 units vitamin D 3  HYPOTHYROIDISM: She has had a multinodular goiter Levothyroxine supplements were stopped in 5/16 and she has continued to be euthyroid   Lab Results  Component Value Date   TSH 0.54 03/26/2016   TSH 0.70 09/16/2015   TSH 0.71 06/17/2015   FREET4 1.26 03/19/2015   FREET4 1.42 11/08/2013   FREET4 1.37 11/24/2012    Last foot exam in 7/17   ADRENAL mass:  She had a CT scan done during her hospitalization and again was found to have a left adrenal mass  She was evaluated previously by a PET scan after  diagnosis of breast cancer in 2015 She was found to have a 3.7 cm hypermetabolic left adrenal mass On a previous CT scan in 2011 she also had a 3.8 cm adrenal mass Cortisol level is normal in 2015 and 2016 No history of hypokalemia or severe hypertension/palpitations, headaches, flushing or significant fluctuations in blood pressure  Recent evaluation with metanephrines and overnight dexamethasone test indicates normal adrenal function She again does not look cushingoid on exam     Examination:   BP (!) 138/58   Pulse 62   Ht 5\' 6"  (1.676 m)   Wt 191 lb (86.6 kg)   BMI 30.83 kg/m   Body mass index is 30.83 kg/m.   Thyroid is enlarged about 2-2.5 times on the right, nodular and less than 2 times normal on the left, firm 1+ edema of lower legs present  ASSESSMENT/ PLAN:   HYPERTENSION: Blood pressure is true Still requiring multiple drugs Recommended follow-up with PCP  EDEMA: Improved with large doses of  Lasix Likely also has venous insufficiency also but does not like to wear elastic stockings  Diabetes type 2   See history of present illness for  discussion of current diabetes management, blood sugar patterns and problems identified  She has reasonable controlled with Januvia 50 mg daily Post prandial readings are averaging about 160 after supper which is adequate for her age A1c is lower than expected for her glucose because of renal function  She will continue the same regimen   GOITER: Clinically the same Will need follow-up of TSH periodically because of her taking amiodarone  Lamar Meter 06/24/2016, 9:31 AM

## 2016-07-13 ENCOUNTER — Other Ambulatory Visit: Payer: Self-pay | Admitting: Family Medicine

## 2016-07-13 MED ORDER — SITAGLIPTIN PHOSPHATE 100 MG PO TABS: 100.0000 mg | ORAL_TABLET | Freq: Every day | ORAL | 1 refills | Status: DC

## 2016-07-18 ENCOUNTER — Other Ambulatory Visit: Payer: Self-pay | Admitting: Family Medicine

## 2016-07-18 DIAGNOSIS — I10 Essential (primary) hypertension: Secondary | ICD-10-CM

## 2016-07-24 ENCOUNTER — Other Ambulatory Visit: Payer: Self-pay | Admitting: Hematology and Oncology

## 2016-07-24 DIAGNOSIS — C50411 Malignant neoplasm of upper-outer quadrant of right female breast: Secondary | ICD-10-CM

## 2016-07-31 DIAGNOSIS — Z961 Presence of intraocular lens: Secondary | ICD-10-CM | POA: Diagnosis not present

## 2016-07-31 DIAGNOSIS — H5203 Hypermetropia, bilateral: Secondary | ICD-10-CM | POA: Diagnosis not present

## 2016-07-31 DIAGNOSIS — H524 Presbyopia: Secondary | ICD-10-CM | POA: Diagnosis not present

## 2016-07-31 DIAGNOSIS — H52223 Regular astigmatism, bilateral: Secondary | ICD-10-CM | POA: Diagnosis not present

## 2016-08-12 ENCOUNTER — Other Ambulatory Visit: Payer: Self-pay | Admitting: Endocrinology

## 2016-09-25 ENCOUNTER — Other Ambulatory Visit: Payer: Self-pay | Admitting: Family Medicine

## 2016-10-07 ENCOUNTER — Other Ambulatory Visit: Payer: Self-pay | Admitting: Family Medicine

## 2016-10-07 ENCOUNTER — Other Ambulatory Visit: Payer: Self-pay | Admitting: Endocrinology

## 2016-10-08 ENCOUNTER — Other Ambulatory Visit: Payer: Self-pay | Admitting: Hematology and Oncology

## 2016-10-08 DIAGNOSIS — Z853 Personal history of malignant neoplasm of breast: Secondary | ICD-10-CM

## 2016-10-15 ENCOUNTER — Encounter: Payer: Self-pay | Admitting: Family Medicine

## 2016-10-15 ENCOUNTER — Ambulatory Visit (INDEPENDENT_AMBULATORY_CARE_PROVIDER_SITE_OTHER): Payer: Medicare Other | Admitting: Family Medicine

## 2016-10-15 VITALS — BP 132/70 | HR 60 | Temp 98.1°F | Resp 18 | Ht 66.5 in | Wt 189.0 lb

## 2016-10-15 DIAGNOSIS — M25561 Pain in right knee: Secondary | ICD-10-CM

## 2016-10-15 DIAGNOSIS — G8929 Other chronic pain: Secondary | ICD-10-CM | POA: Diagnosis not present

## 2016-10-15 NOTE — Progress Notes (Signed)
Subjective:    Patient ID: Whitney [name], female    DOB: 02-11-1928, 83 y.o.   MRN: [id number]  HPI Patient presents with daily pain in her right knee. It hurts worse over the medial compartment but also hurts over the lateral compartment. As long as she sitting down she has very little pain. However when she tries to stand or walk, she has severe pain.  She also has a moderate effusion in the right knee. On examination today, there are palpable bone spurs over the medial compartment. There is a palpable effusion. She has crepitus with range of motion. All the findings are consistent with severe osteoarthritis. Past Medical History:  Diagnosis Date  . Anemia   . Atrial fibrillation ([place])   . Breast cancer ([place]) 10/2013   right upper outer  . Cancer ([place])    right breast  . Complication of anesthesia    slow to wake up  . Diabetes mellitus without complication ([place])   . Dysrhythmia 10/15   AF  . Former smoker   . Full dentures   . Hyperlipidemia   . Hypertension   . Multinodular goiter   . Radiation    Right Breast  . Thyroid disease    hypothyroidism  . Wears glasses    Past Surgical History:  Procedure Laterality Date  . ABDOMINAL HYSTERECTOMY    . AXILLARY LYMPH NODE DISSECTION Right 01/09/2014   Procedure: RIGHT AXILLARY LYMPH NODE DISECTION;  Surgeon: [name], MD;  Location: [facility];  Service: General;  Laterality: Right;  . BREAST LUMPECTOMY WITH RADIOACTIVE SEED LOCALIZATION Right 01/09/2014   Procedure: RIGHT BREAST SEED LOCALIZED LUMPECTOMY ;  Surgeon: [name], MD;  Location: [facility];  Service: General;  Laterality: Right;  . EYE SURGERY  12/22/2009   cataracts  . INTRAMEDULLARY (IM) NAIL INTERTROCHANTERIC Left 02/24/2014   Procedure: IM ROD LEFT HIP FX;  Surgeon: Alta Corning, MD;  Location: [facility];  Service: Orthopedics;  Laterality: Left;  . PORT-A-CATH REMOVAL Right 01/09/2014   Procedure: REMOVAL  PORT-A-CATH;  Surgeon: [name], MD;  Location: [facility];  Service: General;  Laterality: Right;  . PORTACATH PLACEMENT N/A 11/15/2013   Procedure: INSERTION PORT-A-CATH WITH ULTRA SOUND AND FLOROSCOPY;  Surgeon: [name], MD;  Location: [facility];  Service: General;  Laterality: N/A;  . TOTAL KNEE ARTHROPLASTY  2002   left   Current Outpatient Prescriptions on File Prior to Visit  Medication Sig Dispense Refill  . amiodarone (PACERONE) 200 MG tablet TAKE 1 TABLET BY MOUTH EVERY DAY 30 tablet 5  . amLODipine (NORVASC) 5 MG tablet TAKE 1 TABLET (5 MG TOTAL) BY MOUTH DAILY. 30 tablet 3  . atorvastatin (LIPITOR) 10 MG tablet TAKE 1 TABLET (10 MG TOTAL) BY MOUTH DAILY. 90 tablet 1  . doxazosin (CARDURA) 4 MG tablet TAKE 1 TABLET BY MOUTH EVERY DAY 90 tablet 2  . ELIQUIS 5 MG TABS tablet TAKE 1 TABLET TWICE A DAY (Patient taking differently: Take 5 mg by mouth two times a day) 60 tablet 11  . furosemide (LASIX) 40 MG tablet TAKE 2 TABLETS BY MOUTH TWICE A DAY 360 tablet 2  . glimepiride (AMARYL) 1 MG tablet TAKE 1/2 TABLET BY MOUTH TWICE A DAY 30 tablet 3  . KLOR-CON M20 20 MEQ tablet TAKE ONE TABLET BY MOUTH ONCE DAILY 30 tablet 3  . lisinopril (PRINIVIL,ZESTRIL) 20 MG tablet TAKE 1 TABLET BY MOUTH EVERY DAY 30 tablet 11  .  metoprolol succinate (TOPROL-XL) 100 MG 24 hr tablet TAKE 1 TABLET BY MOUTH TWICE A DAY WITH OR IMMEDIATELY FOLLOWING A MEAL 180 tablet 1  . ONETOUCH VERIO test strip CHECK BLOOD SUGAR EVERY DAY AS DIRECTED 50 each 3  . Polyethyl Glycol-Propyl Glycol (SYSTANE OP) Place 1 drop into both eyes 3 (three) times daily.    . Sennosides (EX-LAX) 15 MG TABS Take 1 tablet by mouth daily as needed (CONSTIPATION).    Marland Kitchen sitaGLIPtin (JANUVIA) 100 MG tablet Take 1 tablet (100 mg total) by mouth daily. 90 tablet 1  . tamoxifen (NOLVADEX) 20 MG tablet TAKE 1 TABLET BY MOUTH DAILY 90 tablet 3   No current facility-administered medications on file prior to visit.     Allergies  Allergen Reactions  . Tape Other (See Comments)    Skin is somewhat sensitive   Social History   Social History  . Marital status: Widowed    Spouse name: N/A  . Number of children: 4  . Years of education: N/A   Occupational History  . Not on file.   Social History Main Topics  . Smoking status: Former Smoker    Packs/day: 1.00    Years: 5.00    Quit date: 11/14/1958  . Smokeless tobacco: Never Used  . Alcohol use No  . Drug use: No  . Sexual activity: No     Comment: menarche age 40, P2, first birth age 89, no HRT, menopause age 47   Other Topics Concern  . Not on file   Social History Narrative  . No narrative on file      Review of Systems  All other systems reviewed and are negative.      Objective:   Physical Exam  Cardiovascular: Normal rate, regular rhythm and normal heart sounds.   Pulmonary/Chest: Effort normal and breath sounds normal.  Musculoskeletal:       Right knee: She exhibits decreased range of motion, swelling and effusion. She exhibits no LCL laxity and no MCL laxity. Tenderness found. Medial joint line and lateral joint line tenderness noted.  Vitals reviewed.         Assessment & Plan:  Chronic pain of right knee  Knee pain seems to be secondary to severe osteoarthritis. Using sterile technique, I injected the right knee with a mixture of 2 mL of lidocaine, 2 mL of Marcaine, and 2 mL of 40 mg per mL Kenalog. Patient tolerated the procedure well. She is unable to take NSAIDs due to chronic anticoagulation and chronic kidney disease. However she can use Tylenol on an as-needed basis.

## 2016-10-23 ENCOUNTER — Inpatient Hospital Stay (HOSPITAL_COMMUNITY)
Admission: EM | Admit: 2016-10-23 | Discharge: 2016-10-26 | DRG: 872 | Disposition: A | Discharge status: Home / Self Care | Payer: Medicare Other | Attending: Internal Medicine | Admitting: Internal Medicine

## 2016-10-23 ENCOUNTER — Encounter (HOSPITAL_COMMUNITY): Payer: Self-pay | Admitting: *Deleted

## 2016-10-23 ENCOUNTER — Other Ambulatory Visit: Payer: Self-pay

## 2016-10-23 ENCOUNTER — Emergency Department (HOSPITAL_COMMUNITY): Payer: Medicare Other

## 2016-10-23 ENCOUNTER — Other Ambulatory Visit (HOSPITAL_COMMUNITY): Payer: Self-pay

## 2016-10-23 DIAGNOSIS — Z7901 Long term (current) use of anticoagulants: Secondary | ICD-10-CM | POA: Diagnosis not present

## 2016-10-23 DIAGNOSIS — I480 Paroxysmal atrial fibrillation: Secondary | ICD-10-CM | POA: Diagnosis present

## 2016-10-23 DIAGNOSIS — R627 Adult failure to thrive: Secondary | ICD-10-CM | POA: Diagnosis not present

## 2016-10-23 DIAGNOSIS — R42 Dizziness and giddiness: Secondary | ICD-10-CM | POA: Diagnosis not present

## 2016-10-23 DIAGNOSIS — R7881 Bacteremia: Secondary | ICD-10-CM | POA: Diagnosis present

## 2016-10-23 DIAGNOSIS — R740 Nonspecific elevation of levels of transaminase and lactic acid dehydrogenase [LDH]: Secondary | ICD-10-CM | POA: Diagnosis present

## 2016-10-23 DIAGNOSIS — K7689 Other specified diseases of liver: Secondary | ICD-10-CM | POA: Diagnosis present

## 2016-10-23 DIAGNOSIS — N183 Chronic kidney disease, stage 3 (moderate): Secondary | ICD-10-CM | POA: Diagnosis present

## 2016-10-23 DIAGNOSIS — N390 Urinary tract infection, site not specified: Secondary | ICD-10-CM | POA: Diagnosis not present

## 2016-10-23 DIAGNOSIS — I5032 Chronic diastolic (congestive) heart failure: Secondary | ICD-10-CM | POA: Diagnosis present

## 2016-10-23 DIAGNOSIS — E785 Hyperlipidemia, unspecified: Secondary | ICD-10-CM | POA: Diagnosis present

## 2016-10-23 DIAGNOSIS — I130 Hypertensive heart and chronic kidney disease with heart failure and stage 1 through stage 4 chronic kidney disease, or unspecified chronic kidney disease: Secondary | ICD-10-CM | POA: Diagnosis present

## 2016-10-23 DIAGNOSIS — B9620 Unspecified Escherichia coli [E. coli] as the cause of diseases classified elsewhere: Secondary | ICD-10-CM | POA: Diagnosis present

## 2016-10-23 DIAGNOSIS — Z79810 Long term (current) use of selective estrogen receptor modulators (SERMs): Secondary | ICD-10-CM

## 2016-10-23 DIAGNOSIS — A419 Sepsis, unspecified organism: Secondary | ICD-10-CM | POA: Diagnosis not present

## 2016-10-23 DIAGNOSIS — C50411 Malignant neoplasm of upper-outer quadrant of right female breast: Secondary | ICD-10-CM | POA: Diagnosis present

## 2016-10-23 DIAGNOSIS — E119 Type 2 diabetes mellitus without complications: Secondary | ICD-10-CM | POA: Diagnosis not present

## 2016-10-23 DIAGNOSIS — R001 Bradycardia, unspecified: Secondary | ICD-10-CM | POA: Diagnosis present

## 2016-10-23 DIAGNOSIS — E039 Hypothyroidism, unspecified: Secondary | ICD-10-CM | POA: Diagnosis present

## 2016-10-23 DIAGNOSIS — Z87891 Personal history of nicotine dependence: Secondary | ICD-10-CM | POA: Diagnosis not present

## 2016-10-23 DIAGNOSIS — R4781 Slurred speech: Secondary | ICD-10-CM | POA: Diagnosis not present

## 2016-10-23 DIAGNOSIS — Z90710 Acquired absence of both cervix and uterus: Secondary | ICD-10-CM

## 2016-10-23 DIAGNOSIS — Z79899 Other long term (current) drug therapy: Secondary | ICD-10-CM

## 2016-10-23 DIAGNOSIS — Z9109 Other allergy status, other than to drugs and biological substances: Secondary | ICD-10-CM

## 2016-10-23 DIAGNOSIS — E042 Nontoxic multinodular goiter: Secondary | ICD-10-CM | POA: Diagnosis present

## 2016-10-23 DIAGNOSIS — E1122 Type 2 diabetes mellitus with diabetic chronic kidney disease: Secondary | ICD-10-CM | POA: Diagnosis present

## 2016-10-23 DIAGNOSIS — R7989 Other specified abnormal findings of blood chemistry: Secondary | ICD-10-CM | POA: Diagnosis not present

## 2016-10-23 DIAGNOSIS — R7401 Elevation of levels of liver transaminase levels: Secondary | ICD-10-CM

## 2016-10-23 DIAGNOSIS — E872 Acidosis: Secondary | ICD-10-CM | POA: Diagnosis present

## 2016-10-23 DIAGNOSIS — I10 Essential (primary) hypertension: Secondary | ICD-10-CM | POA: Diagnosis not present

## 2016-10-23 DIAGNOSIS — Z7984 Long term (current) use of oral hypoglycemic drugs: Secondary | ICD-10-CM

## 2016-10-23 LAB — APTT: aPTT: 33 seconds (ref 24–36)

## 2016-10-23 LAB — URINALYSIS, ROUTINE W REFLEX MICROSCOPIC
Bilirubin Urine: NEGATIVE
Glucose, UA: NEGATIVE mg/dL
Ketones, ur: NEGATIVE mg/dL
Leukocytes, UA: NEGATIVE
Nitrite: NEGATIVE
Protein, ur: 30 mg/dL — AB
Specific Gravity, Urine: 1.01 (ref 1.005–1.030)
Squamous Epithelial / LPF: NONE SEEN
pH: 6 (ref 5.0–8.0)

## 2016-10-23 LAB — COMPREHENSIVE METABOLIC PANEL
ALT: 262 U/L — ABNORMAL HIGH (ref 14–54)
AST: 305 U/L — ABNORMAL HIGH (ref 15–41)
Albumin: 3.1 g/dL — ABNORMAL LOW (ref 3.5–5.0)
Alkaline Phosphatase: 51 U/L (ref 38–126)
Anion gap: 9 (ref 5–15)
BUN: 24 mg/dL — ABNORMAL HIGH (ref 6–20)
CO2: 26 mmol/L (ref 22–32)
Calcium: 9 mg/dL (ref 8.9–10.3)
Chloride: 101 mmol/L (ref 101–111)
Creatinine, Ser: 1.45 mg/dL — ABNORMAL HIGH (ref 0.44–1.00)
GFR calc Af Amer: 37 mL/min — ABNORMAL LOW (ref >60)
GFR calc non Af Amer: 32 mL/min — ABNORMAL LOW (ref >60)
Glucose, Bld: 212 mg/dL — ABNORMAL HIGH (ref 65–99)
Potassium: 3.5 mmol/L (ref 3.5–5.1)
Sodium: 136 mmol/L (ref 135–145)
Total Bilirubin: 1 mg/dL (ref 0.3–1.2)
Total Protein: 6.4 g/dL — ABNORMAL LOW (ref 6.5–8.1)

## 2016-10-23 LAB — CBC
HCT: 34.9 % — ABNORMAL LOW (ref 36.0–46.0)
Hemoglobin: 11.4 g/dL — ABNORMAL LOW (ref 12.0–15.0)
MCH: 29.6 pg (ref 26.0–34.0)
MCHC: 32.7 g/dL (ref 30.0–36.0)
MCV: 90.6 fL (ref 78.0–100.0)
Platelets: 136 10*3/uL — ABNORMAL LOW (ref 150–400)
RBC: 3.85 MIL/uL — ABNORMAL LOW (ref 3.87–5.11)
RDW: 15.1 % (ref 11.5–15.5)
WBC: 11.2 10*3/uL — ABNORMAL HIGH (ref 4.0–10.5)

## 2016-10-23 LAB — I-STAT CHEM 8, ED
BUN: 26 mg/dL — ABNORMAL HIGH (ref 6–20)
Calcium, Ion: 1.11 mmol/L — ABNORMAL LOW (ref 1.15–1.40)
Chloride: 98 mmol/L — ABNORMAL LOW (ref 101–111)
Creatinine, Ser: 1.4 mg/dL — ABNORMAL HIGH (ref 0.44–1.00)
Glucose, Bld: 210 mg/dL — ABNORMAL HIGH (ref 65–99)
HCT: 34 % — ABNORMAL LOW (ref 36.0–46.0)
Hemoglobin: 11.6 g/dL — ABNORMAL LOW (ref 12.0–15.0)
Potassium: 3.5 mmol/L (ref 3.5–5.1)
Sodium: 136 mmol/L (ref 135–145)
TCO2: 28 mmol/L (ref 0–100)

## 2016-10-23 LAB — DIFFERENTIAL
Basophils Absolute: 0 10*3/uL (ref 0.0–0.1)
Basophils Relative: 0 %
Eosinophils Absolute: 0 10*3/uL (ref 0.0–0.7)
Eosinophils Relative: 0 %
Lymphocytes Relative: 5 %
Lymphs Abs: 0.5 10*3/uL — ABNORMAL LOW (ref 0.7–4.0)
Monocytes Absolute: 0.3 10*3/uL (ref 0.1–1.0)
Monocytes Relative: 3 %
Neutro Abs: 10.3 10*3/uL — ABNORMAL HIGH (ref 1.7–7.7)
Neutrophils Relative %: 92 %

## 2016-10-23 LAB — PROTIME-INR
INR: 2.22
Prothrombin Time: 25 seconds — ABNORMAL HIGH (ref 11.4–15.2)

## 2016-10-23 LAB — I-STAT CG4 LACTIC ACID, ED: Lactic Acid, Venous: 1.27 mmol/L (ref 0.5–1.9)

## 2016-10-23 LAB — I-STAT TROPONIN, ED: Troponin i, poc: 0.04 ng/mL (ref 0.00–0.08)

## 2016-10-23 MED ORDER — METOPROLOL SUCCINATE ER 50 MG PO TB24
50.0000 mg | ORAL_TABLET | Freq: Two times a day (BID) | ORAL | Status: DC
  Filled 2016-10-23 (×2): qty 1

## 2016-10-23 MED ORDER — ACETAMINOPHEN 650 MG RE SUPP
650.0000 mg | Freq: Four times a day (QID) | RECTAL | Status: DC | PRN
Start: 2016-10-23 — End: 2016-10-24

## 2016-10-23 MED ORDER — POTASSIUM CHLORIDE CRYS ER 20 MEQ PO TBCR
20.0000 meq | EXTENDED_RELEASE_TABLET | Freq: Once | ORAL | Status: AC
  Administered 2016-10-24: 20 meq via ORAL
  Filled 2016-10-23: qty 1

## 2016-10-23 MED ORDER — DEXTROSE 5 % IV SOLN: 1.0000 g | Freq: Once | INTRAVENOUS | Status: DC

## 2016-10-23 MED ORDER — DOXAZOSIN MESYLATE 4 MG PO TABS
4.0000 mg | ORAL_TABLET | Freq: Every day | ORAL | Status: DC
  Administered 2016-10-24 – 2016-10-26 (×3): 4 mg via ORAL
  Filled 2016-10-23 (×3): qty 1

## 2016-10-23 MED ORDER — APIXABAN 5 MG PO TABS
5.0000 mg | ORAL_TABLET | Freq: Two times a day (BID) | ORAL | Status: DC
  Administered 2016-10-23 – 2016-10-24 (×2): 5 mg via ORAL
  Filled 2016-10-23 (×2): qty 1

## 2016-10-23 MED ORDER — ONDANSETRON HCL 4 MG/2ML IJ SOLN: 4.0000 mg | Freq: Four times a day (QID) | INTRAMUSCULAR | Status: DC | PRN

## 2016-10-23 MED ORDER — INSULIN ASPART 100 UNIT/ML ~~LOC~~ SOLN
0.0000 [IU] | Freq: Three times a day (TID) | SUBCUTANEOUS | Status: DC
  Administered 2016-10-24: 1 [IU] via SUBCUTANEOUS
  Administered 2016-10-24 (×2): 2 [IU] via SUBCUTANEOUS
  Administered 2016-10-25: 1 [IU] via SUBCUTANEOUS
  Administered 2016-10-25 (×2): 2 [IU] via SUBCUTANEOUS
  Administered 2016-10-26: 1 [IU] via SUBCUTANEOUS

## 2016-10-23 MED ORDER — CEFTRIAXONE SODIUM 1 G IJ SOLR
1.0000 g | Freq: Once | INTRAMUSCULAR | Status: AC
  Administered 2016-10-23: 1 g via INTRAVENOUS
  Filled 2016-10-23: qty 10

## 2016-10-23 MED ORDER — POTASSIUM CHLORIDE CRYS ER 20 MEQ PO TBCR: 40.0000 meq | EXTENDED_RELEASE_TABLET | Freq: Once | ORAL | Status: DC

## 2016-10-23 MED ORDER — SENNA 8.6 MG PO TABS: 2.0000 | ORAL_TABLET | Freq: Every day | ORAL | Status: DC | PRN

## 2016-10-23 MED ORDER — AMIODARONE HCL 200 MG PO TABS: 200.0000 mg | ORAL_TABLET | Freq: Every day | ORAL | Status: DC

## 2016-10-23 MED ORDER — LISINOPRIL 20 MG PO TABS
20.0000 mg | ORAL_TABLET | Freq: Every day | ORAL | Status: DC
  Filled 2016-10-23: qty 1

## 2016-10-23 MED ORDER — ACETAMINOPHEN 325 MG PO TABS: 650.0000 mg | ORAL_TABLET | Freq: Four times a day (QID) | ORAL | Status: DC | PRN

## 2016-10-23 MED ORDER — LINAGLIPTIN 5 MG PO TABS: 5.0000 mg | ORAL_TABLET | Freq: Every day | ORAL | Status: DC

## 2016-10-23 MED ORDER — ATORVASTATIN CALCIUM 10 MG PO TABS
10.0000 mg | ORAL_TABLET | Freq: Every day | ORAL | Status: DC
  Administered 2016-10-24: 10 mg via ORAL
  Filled 2016-10-23: qty 1

## 2016-10-23 MED ORDER — TAMOXIFEN CITRATE 10 MG PO TABS
20.0000 mg | ORAL_TABLET | Freq: Every day | ORAL | Status: DC
  Administered 2016-10-24 – 2016-10-26 (×3): 20 mg via ORAL
  Filled 2016-10-23 (×3): qty 2

## 2016-10-23 MED ORDER — ONDANSETRON HCL 4 MG PO TABS: 4.0000 mg | ORAL_TABLET | Freq: Four times a day (QID) | ORAL | Status: DC | PRN

## 2016-10-23 MED ORDER — POLYVINYL ALCOHOL 1.4 % OP SOLN
Freq: Three times a day (TID) | OPHTHALMIC | Status: DC
  Administered 2016-10-24 – 2016-10-26 (×8): via OPHTHALMIC
  Filled 2016-10-23 (×2): qty 15

## 2016-10-23 MED ORDER — AMLODIPINE BESYLATE 5 MG PO TABS
5.0000 mg | ORAL_TABLET | Freq: Every day | ORAL | Status: DC
  Filled 2016-10-23: qty 1

## 2016-10-23 NOTE — ED Notes (Signed)
Pt passed his swallow screen 

## 2016-10-23 NOTE — ED Provider Notes (Signed)
[facility] DEPT Provider Note   CSN: [id number] Arrival date & time: 10/23/16  1305     History   Chief Complaint Chief Complaint  Patient presents with  . Dizziness    HPI Whitney [name] is a 83 y.o. female.  The history is provided by the patient. No language interpreter was used.  Dizziness    Whitney [name] is a 83 y.o. female who presents to the Emergency Department complaining of dizziness, vomiting.  This morning she awoke and felt well but shortly after waking she developed dizziness described as an off balance type sensation. Her symptoms are worse with ambulation and she's had associated vomiting. She denies any fevers, chest pain, shortness of breath, abdominal pain, diarrhea, dysuria. She feels just generally unwell. She does feel slightly improved since ED presentation. She reports similar symptoms in the past.  Past Medical History:  Diagnosis Date  . Anemia   . Atrial fibrillation ([place])   . Breast cancer ([place]) 10/2013   right upper outer  . Cancer ([place])    right breast  . Complication of anesthesia    slow to wake up  . Diabetes mellitus without complication ([place])   . Dysrhythmia 10/15   AF  . Former smoker   . Full dentures   . Hyperlipidemia   . Hypertension   . Multinodular goiter   . Radiation    Right Breast  . Thyroid disease    hypothyroidism  . Wears glasses     Patient Active Problem List   Diagnosis Date Noted  . Transaminitis 10/23/2016  . Sinus bradycardia 10/23/2016  . Controlled diabetes mellitus type 2 with complications ([place])   . Paroxysmal atrial fibrillation ([facility])   . Chronic diastolic CHF (congestive heart failure) ([place])   . Pulmonary hypertension ([place])   . Acute renal failure superimposed on stage 2 chronic kidney disease ([place])   . Elevated liver enzymes   . Bacteremia due to Escherichia coli 12/30/2015  . UTI (urinary tract infection) 12/30/2015  . Acute blood loss anemia 02/25/2014  . Closed left hip fracture  ([place]) 02/24/2014  . PAF (paroxysmal atrial fibrillation) ([place]) 01/30/2014  . Anticoagulated 01/30/2014  . Chronic diastolic CHF (congestive heart failure), NYHA class 2 ([place]) 12/10/2013  . Preoperative cardiovascular examination 12/10/2013  . Neutropenic fever ([place]) 12/08/2013  . Anemia of chronic disease 12/08/2013  . Thrombocytopenia ([place]) 12/08/2013  . Hypokalemia 12/08/2013  . Pancytopenia due to antineoplastic chemotherapy ([place]) 12/08/2013  . Hypothyroidism 12/08/2013  . Breast cancer of upper-outer quadrant of right female breast ([place]) 11/01/2013  . Essential hypertension, benign 12/01/2012  . Pure hypercholesterolemia 12/01/2012  . DM2 (diabetes mellitus, type 2) ([place]) 11/21/2012    Past Surgical History:  Procedure Laterality Date  . ABDOMINAL HYSTERECTOMY    . AXILLARY LYMPH NODE DISSECTION Right 01/09/2014   Procedure: RIGHT AXILLARY LYMPH NODE DISECTION;  Surgeon: [name], MD;  Location: [facility];  Service: General;  Laterality: Right;  . BREAST LUMPECTOMY WITH RADIOACTIVE SEED LOCALIZATION Right 01/09/2014   Procedure: RIGHT BREAST SEED LOCALIZED LUMPECTOMY ;  Surgeon: [name], MD;  Location: [facility];  Service: General;  Laterality: Right;  . EYE SURGERY  12/22/2009   cataracts  . INTRAMEDULLARY (IM) NAIL INTERTROCHANTERIC Left 02/24/2014   Procedure: IM ROD LEFT HIP FX;  Surgeon: Alta Corning, MD;  Location: [facility];  Service: Orthopedics;  Laterality: Left;  . PORT-A-CATH REMOVAL Right 01/09/2014   Procedure: REMOVAL PORT-A-CATH;  Surgeon: Marcello Moores  Cornett, MD;  Location: [facility];  Service: General;  Laterality: Right;  . PORTACATH PLACEMENT N/A 11/15/2013   Procedure: INSERTION PORT-A-CATH WITH ULTRA SOUND AND FLOROSCOPY;  Surgeon: [name], MD;  Location: [facility];  Service: General;  Laterality: N/A;  . TOTAL KNEE ARTHROPLASTY  2002   left    OB History    No data available       Home  Medications    Prior to Admission medications   Medication Sig Start Date End Date Taking? Authorizing Provider  amiodarone (PACERONE) 200 MG tablet TAKE 1 TABLET BY MOUTH EVERY DAY 10/07/16  Yes [name], MD  amLODipine (NORVASC) 5 MG tablet TAKE 1 TABLET (5 MG TOTAL) BY MOUTH DAILY. 02/20/15  Yes Elayne Snare, MD  atorvastatin (LIPITOR) 10 MG tablet TAKE 1 TABLET (10 MG TOTAL) BY MOUTH DAILY. 05/11/16  Yes Elayne Snare, MD  Carboxymeth-Glycerin-Polysorb (REFRESH OPTIVE ADVANCED OP) Place 1 drop into both eyes 3 (three) times daily.   Yes [provider]  doxazosin (CARDURA) 4 MG tablet TAKE 1 TABLET BY MOUTH EVERY DAY 07/20/16  Yes Pickard, Cammie Mcgee, MD  ELIQUIS 5 MG TABS tablet TAKE 1 TABLET TWICE A DAY Patient taking differently: Take 5 mg by mouth two times a day 11/25/15  Yes [name], MD  furosemide (LASIX) 40 MG tablet TAKE 2 TABLETS BY MOUTH TWICE A DAY 09/25/16  Yes [name], MD  glimepiride (AMARYL) 1 MG tablet TAKE 1/2 TABLET BY MOUTH TWICE A DAY 08/12/16  Yes Elayne Snare, MD  KLOR-CON M20 20 MEQ tablet TAKE ONE TABLET BY MOUTH ONCE DAILY 10/07/16  Yes Elayne Snare, MD  lisinopril (PRINIVIL,ZESTRIL) 20 MG tablet TAKE 1 TABLET BY MOUTH EVERY DAY 03/17/16  Yes [name], MD  metoprolol succinate (TOPROL-XL) 100 MG 24 hr tablet TAKE 1 TABLET BY MOUTH TWICE A DAY WITH OR IMMEDIATELY FOLLOWING A MEAL 05/28/16  Yes Elayne Snare, MD  Sennosides (EX-LAX) 15 MG TABS Take 1 tablet by mouth daily as needed (CONSTIPATION).   Yes [provider]  sitaGLIPtin (JANUVIA) 100 MG tablet Take 1 tablet (100 mg total) by mouth daily. 07/13/16  Yes [name], MD  tamoxifen (NOLVADEX) 20 MG tablet TAKE 1 TABLET BY MOUTH DAILY 07/24/16  Yes Nicholas Lose, MD    Family History No family history on file.  Social History Social History  Substance Use Topics  . Smoking status: Former Smoker    Packs/day: 1.00    Years: 5.00    Quit date: 11/14/1958  .  Smokeless tobacco: Never Used  . Alcohol use No     Allergies   Tape   Review of Systems Review of Systems  Neurological: Positive for dizziness.  All other systems reviewed and are negative.    Physical Exam Updated Vital Signs BP (!) 148/59   Pulse (!) 47   Temp 98.1 F (36.7 C) (Oral)   Resp 19   SpO2 99%   Physical Exam  Constitutional: She is oriented to person, place, and time. She appears well-developed and well-nourished.  HENT:  Head: Normocephalic and atraumatic.  Eyes: Pupils are equal, round, and reactive to light. EOM are normal.  Cardiovascular:  No murmur heard. Bradycardic and irregular  Pulmonary/Chest: Effort normal and breath sounds normal. No respiratory distress.  Abdominal: Soft. There is no tenderness. There is no rebound and no guarding.  Musculoskeletal: She exhibits no tenderness.  Nonpitting edema to bilateral lower extremities  Neurological:  She is alert and oriented to person, place, and time. No cranial nerve deficit.  No pronator drift. 5 out of 5 strength in all 4 extremities. No ataxia on finger tenderness bilaterally  Skin: Skin is warm and dry.  Psychiatric: She has a normal mood and affect. Her behavior is normal.  Nursing note and vitals reviewed.    ED Treatments / Results  Labs (all labs ordered are listed, but only abnormal results are displayed) Labs Reviewed  PROTIME-INR - Abnormal; Notable for the following:       Result Value   Prothrombin Time 25.0 (*)    All other components within normal limits  CBC - Abnormal; Notable for the following:    WBC 11.2 (*)    RBC 3.85 (*)    Hemoglobin 11.4 (*)    HCT 34.9 (*)    Platelets 136 (*)    All other components within normal limits  DIFFERENTIAL - Abnormal; Notable for the following:    Neutro Abs 10.3 (*)    Lymphs Abs 0.5 (*)    All other components within normal limits  COMPREHENSIVE METABOLIC PANEL - Abnormal; Notable for the following:    Glucose, Bld 212 (*)      BUN 24 (*)    Creatinine, Ser 1.45 (*)    Total Protein 6.4 (*)    Albumin 3.1 (*)    AST 305 (*)    ALT 262 (*)    GFR calc non Af Amer 32 (*)    GFR calc Af Amer 37 (*)    All other components within normal limits  URINALYSIS, ROUTINE W REFLEX MICROSCOPIC - Abnormal; Notable for the following:    APPearance HAZY (*)    Hgb urine dipstick SMALL (*)    Protein, ur 30 (*)    Bacteria, UA RARE (*)    All other components within normal limits  I-STAT CHEM 8, ED - Abnormal; Notable for the following:    Chloride 98 (*)    BUN 26 (*)    Creatinine, Ser 1.40 (*)    Glucose, Bld 210 (*)    Calcium, Ion 1.11 (*)    Hemoglobin 11.6 (*)    HCT 34.0 (*)    All other components within normal limits  URINE CULTURE  CULTURE, BLOOD (ROUTINE X 2)  CULTURE, BLOOD (ROUTINE X 2)  APTT  CBC  MAGNESIUM  COMPREHENSIVE METABOLIC PANEL  I-STAT TROPONIN, ED  I-STAT CG4 LACTIC ACID, ED  CBG MONITORING, ED    EKG  EKG Interpretation None       Radiology Ct Head Wo Contrast  Result Date: 10/23/2016 CLINICAL DATA:  Slurred speech. EXAM: CT HEAD WITHOUT CONTRAST TECHNIQUE: Contiguous axial images were obtained from the base of the skull through the vertex without intravenous contrast. COMPARISON:  None. FINDINGS: Brain: No acute infarct, hemorrhage, or mass lesion is present. The ventricles are of normal size. No significant extraaxial fluid collection is present. Mild atrophy and white matter changes are within normal limits for age. Vascular: Vascular calcifications are present. There is no hyperdense vessel. Skull: The calvarium is intact. No focal lytic or blastic lesions are present. Sinuses/Orbits: The paranasal sinuses and mastoid air cells are clear. Bilateral lens replacements are present. The globes and orbits are within normal limits. IMPRESSION: 1. Normal CT of the head for age. 2. Atherosclerotic calcifications without hyperdense vessel. Electronically Signed   By: San Morelle M.D.   On: 10/23/2016 14:04   US Abdomen Complete  Result Date: 10/23/2016 CLINICAL DATA:  Vomiting since this morning. Elevated liver function tests. History of breast cancer. EXAM: ABDOMEN ULTRASOUND COMPLETE COMPARISON:  Abdomen and pelvis CT dated 01/01/2016. FINDINGS: Gallbladder: No gallstones or wall thickening visualized. No sonographic Murphy sign noted by sonographer. The gallbladder fundus is obscured by overlying bowel gas. There was a 2.4 cm gallstone in the gallbladder on the previous CT. Common bile duct: Diameter: 4.6 mm Liver: 1.2 cm cyst. Within normal limits in parenchymal echogenicity. IVC: No abnormality visualized. Pancreas: Visualized portion unremarkable. Spleen: Size and appearance within normal limits. Right Kidney: Length: 10.8 cm. Normal echotexture. 3.2 cm and 1.6 cm upper pole cyst. No hydronephrosis. Left Kidney: Length: 8.8 cm. 2.2 cm cyst. Normal echotexture. No hydronephrosis. Abdominal aorta: No aneurysm visualized. Other findings: None. IMPRESSION: 1. The previously demonstrated 2.4 cm gallstone in the gallbladder is not visualized today. This is most likely due to obscuration of the stone by overlying bowel gas. 2. No acute abnormality. Electronically Signed   By: Claudie Revering M.D.   On: 10/23/2016 18:10    Procedures Procedures (including critical care time)  Medications Ordered in ED Medications  tamoxifen (NOLVADEX) tablet 20 mg (not administered)  apixaban (ELIQUIS) tablet 5 mg (5 mg Oral Given 10/23/16 2341)  doxazosin (CARDURA) tablet 4 mg (not administered)  atorvastatin (LIPITOR) tablet 10 mg (not administered)  metoprolol succinate (TOPROL-XL) 24 hr tablet 50 mg (50 mg Oral Not Given 10/24/16 0020)  senna (SENOKOT) tablet 17.2 mg (not administered)  lisinopril (PRINIVIL,ZESTRIL) tablet 20 mg (not administered)  amLODipine (NORVASC) tablet 5 mg (not administered)  polyvinyl alcohol (LIQUIFILM TEARS) 1.4 % ophthalmic solution ( Both Eyes Given  10/24/16 0021)  insulin aspart (novoLOG) injection 0-9 Units (not administered)  acetaminophen (TYLENOL) tablet 650 mg (not administered)    Or  acetaminophen (TYLENOL) suppository 650 mg (not administered)  ondansetron (ZOFRAN) tablet 4 mg (not administered)    Or  ondansetron (ZOFRAN) injection 4 mg (not administered)  cefTRIAXone (ROCEPHIN) 1 g in dextrose 5 % 50 mL IVPB (0 g Intravenous Stopped 10/23/16 2250)  potassium chloride SA (K-DUR,KLOR-CON) CR tablet 20 mEq (20 mEq Oral Given 10/24/16 0020)     Initial Impression / Assessment and Plan / ED Course  I have reviewed the triage vital signs and the nursing notes.  Pertinent labs & imaging results that were available during my care of the patient were reviewed by me and considered in my medical decision making (see chart for details).     Patient here for evaluation of nausea, vomiting, dizziness described as vertigo. She is nontoxic appearing on examination with no focal neurologic deficits. She is feeling improved on ED evaluation. CBC demonstrates mild leukocytosis and CMP with mild elevation in her transaminases. She has a history of similar episodes in the past and had bacteremia with Escherichia coli due to UTI. UA with a small amount of bacteria and white cells, given her symptoms and history will treat with IV fluids, antibiotics and send cultures. Hospitalist consulted for admission.  Final Clinical Impressions(s) / ED Diagnoses   Final diagnoses:  None    New Prescriptions Current Discharge Medication List       Quintella Reichert, MD 10/24/16 [phone]

## 2016-10-23 NOTE — ED Notes (Signed)
The pt has been in [place] for awhile

## 2016-10-23 NOTE — ED Notes (Signed)
Pt ambulated in hall, Pt walked with a  Steady gait and stated that nothing was hurting her when walking.

## 2016-10-23 NOTE — ED Notes (Signed)
No pain

## 2016-10-23 NOTE — ED Triage Notes (Signed)
Pt c/o dizziness onset today upon awakening at 6am, pt went to bed last night @ 20:00 yesterday, pt states, "I just don't feel like myself." denies CP & SOB, pts daughter reports slurred speech, pt airway intact, pt answers questions appropriately, A&O x4

## 2016-10-23 NOTE — H&P (Signed)
History and Physical    Whitney [name] UVO:[id number] DOB: December 14, 1927 DOA: 10/23/2016  PCP: [name], MD  Patient coming from: Home  I have personally briefly reviewed patient's old medical records in [place]  Chief Complaint: Dizziness  HPI: Whitney [name] is a 83 y.o. female with medical history significant of A.Fib on Eliquis, DM2, BRCA in remission on tamoxifen.  Patient presents to the ED with c/o dizziness and vomiting.  Symptoms onset shortly after waking up this morning.  Symptoms worse with ambulation.  Associated vomiting.  No fevers, chest pain, SOB, abd pain, dysuria.  Has just felt generally unwell.   ED Course: LFT elevations in the 200s, UA shows 6-30 WBC, no leuk esterase.  Tm 99.4.  Concerning however, patient had very similar presentation last October with LFT elevations, etc.  Was sent home and had to be called back and admitted after her BCx came back positive for E.Coli from a UTI (positive UA that time though).   Review of Systems: As per HPI otherwise 10 point review of systems negative.   Past Medical History:  Diagnosis Date  . Anemia   . Atrial fibrillation ([place])   . Breast cancer ([place]) 10/2013   right upper outer  . Cancer ([place])    right breast  . Complication of anesthesia    slow to wake up  . Diabetes mellitus without complication ([place])   . Dysrhythmia 10/15   AF  . Former smoker   . Full dentures   . Hyperlipidemia   . Hypertension   . Multinodular goiter   . Radiation    Right Breast  . Thyroid disease    hypothyroidism  . Wears glasses     Past Surgical History:  Procedure Laterality Date  . ABDOMINAL HYSTERECTOMY    . AXILLARY LYMPH NODE DISSECTION Right 01/09/2014   Procedure: RIGHT AXILLARY LYMPH NODE DISECTION;  Surgeon: [name], MD;  Location: [place] [facility];  Service: General;  Laterality: Right;  . BREAST LUMPECTOMY WITH RADIOACTIVE SEED LOCALIZATION Right 01/09/2014   Procedure:  RIGHT BREAST SEED LOCALIZED LUMPECTOMY ;  Surgeon: [name], MD;  Location: [facility];  Service: General;  Laterality: Right;  . EYE SURGERY  12/22/2009   cataracts  . INTRAMEDULLARY (IM) NAIL INTERTROCHANTERIC Left 02/24/2014   Procedure: IM ROD LEFT HIP FX;  Surgeon: Alta Corning, MD;  Location: [facility];  Service: Orthopedics;  Laterality: Left;  . PORT-A-CATH REMOVAL Right 01/09/2014   Procedure: REMOVAL PORT-A-CATH;  Surgeon: [name], MD;  Location: [facility];  Service: General;  Laterality: Right;  . PORTACATH PLACEMENT N/A 11/15/2013   Procedure: INSERTION PORT-A-CATH WITH ULTRA SOUND AND FLOROSCOPY;  Surgeon: [name], MD;  Location: [facility];  Service: General;  Laterality: N/A;  . TOTAL KNEE ARTHROPLASTY  2002   left     reports that she quit smoking about 57 years ago. She has a 5.00 pack-year smoking history. She has never used smokeless tobacco. She reports that she does not drink alcohol or use drugs.  Allergies  Allergen Reactions  . Tape Other (See Comments)    Skin is somewhat sensitive    No family history on file.   Prior to Admission medications   Medication Sig Start Date End Date Taking? Authorizing Provider  amiodarone (PACERONE) 200 MG tablet TAKE 1 TABLET BY MOUTH EVERY DAY 10/07/16  Yes [name], MD  amLODipine (NORVASC) 5 MG tablet TAKE 1 TABLET (  5 MG TOTAL) BY MOUTH DAILY. 02/20/15  Yes Elayne Snare, MD  atorvastatin (LIPITOR) 10 MG tablet TAKE 1 TABLET (10 MG TOTAL) BY MOUTH DAILY. 05/11/16  Yes Elayne Snare, MD  Carboxymeth-Glycerin-Polysorb (REFRESH OPTIVE ADVANCED OP) Place 1 drop into both eyes 3 (three) times daily.   Yes [provider]  doxazosin (CARDURA) 4 MG tablet TAKE 1 TABLET BY MOUTH EVERY DAY 07/20/16  Yes Pickard, Cammie Mcgee, MD  ELIQUIS 5 MG TABS tablet TAKE 1 TABLET TWICE A DAY Patient taking differently: Take 5 mg by mouth two times a day 11/25/15  Yes [name], MD  furosemide  (LASIX) 40 MG tablet TAKE 2 TABLETS BY MOUTH TWICE A DAY 09/25/16  Yes [name], MD  glimepiride (AMARYL) 1 MG tablet TAKE 1/2 TABLET BY MOUTH TWICE A DAY 08/12/16  Yes Elayne Snare, MD  KLOR-CON M20 20 MEQ tablet TAKE ONE TABLET BY MOUTH ONCE DAILY 10/07/16  Yes Elayne Snare, MD  lisinopril (PRINIVIL,ZESTRIL) 20 MG tablet TAKE 1 TABLET BY MOUTH EVERY DAY 03/17/16  Yes [name], MD  metoprolol succinate (TOPROL-XL) 100 MG 24 hr tablet TAKE 1 TABLET BY MOUTH TWICE A DAY WITH OR IMMEDIATELY FOLLOWING A MEAL 05/28/16  Yes Elayne Snare, MD  Sennosides (EX-LAX) 15 MG TABS Take 1 tablet by mouth daily as needed (CONSTIPATION).   Yes [provider]  sitaGLIPtin (JANUVIA) 100 MG tablet Take 1 tablet (100 mg total) by mouth daily. 07/13/16  Yes [name], MD  tamoxifen (NOLVADEX) 20 MG tablet TAKE 1 TABLET BY MOUTH DAILY 07/24/16  Yes Nicholas Lose, MD    Physical Exam: Vitals:   10/23/16 1900 10/23/16 2030 10/23/16 2100 10/23/16 2130  BP: (!) 104/48 (!) 115/58 (!) 122/54 (!) 122/59  Pulse: (!) 55 (!) 50 (!) 48 (!) 48  Resp: _0 Temp:      TempSrc:      SpO2: 98% 97% 97% 97%    Constitutional: NAD, calm, comfortable Eyes: PERRL, lids and conjunctivae normal ENMT: Mucous membranes are moist. Posterior pharynx clear of any exudate or lesions.Normal dentition.  Neck: normal, supple, no masses, no thyromegaly Respiratory: clear to auscultation bilaterally, no wheezing, no crackles. Normal respiratory effort. No accessory muscle use.  Cardiovascular: Bradycardic, regular. Abdomen: no tenderness, no masses palpated. No hepatosplenomegaly. Bowel sounds positive.  Musculoskeletal: no clubbing / cyanosis. No joint deformity upper and lower extremities. Good ROM, no contractures. Normal muscle tone.  Skin: no rashes, lesions, ulcers. No induration Neurologic: CN 2-12 grossly intact. Sensation intact, DTR normal. Strength 5/5 in all 4.  Psychiatric: Normal judgment and  insight. Alert and oriented x 3. Normal mood.    Labs on Admission: I have personally reviewed following labs and imaging studies  CBC:  Recent Labs Lab 10/23/16 1317 10/23/16 1338  WBC 11.2*  --   NEUTROABS 10.3*  --   HGB 11.4* 11.6*  HCT 34.9* 34.0*  MCV 90.6  --   PLT 136*  --    Basic Metabolic Panel:  Recent Labs Lab 10/23/16 1317 10/23/16 1338  NA 136 136  K 3.5 3.5  CL 101 98*  CO2 26  --   GLUCOSE 212* 210*  BUN 24* 26*  CREATININE 1.45* 1.40*  CALCIUM 9.0  --    GFR: Estimated Creatinine Clearance: 32.1 mL/min (A) (by C-G formula based on SCr of 1.4 mg/dL (H)). Liver Function Tests:  Recent Labs Lab 10/23/16 1317  AST 305*  ALT 262*  ALKPHOS 51  BILITOT 1.0  PROT 6.4*  ALBUMIN 3.1*   No results for input(s): LIPASE, AMYLASE in the last 168 hours. No results for input(s): AMMONIA in the last 168 hours. Coagulation Profile:  Recent Labs Lab 10/23/16 1317  INR 2.22   Cardiac Enzymes: No results for input(s): CKTOTAL, CKMB, CKMBINDEX, TROPONINI in the last 168 hours. BNP (last 3 results) No results for input(s): PROBNP in the last 8760 hours. HbA1C: No results for input(s): HGBA1C in the last 72 hours. CBG: No results for input(s): GLUCAP in the last 168 hours. Lipid Profile: No results for input(s): CHOL, HDL, LDLCALC, TRIG, CHOLHDL, LDLDIRECT in the last 72 hours. Thyroid Function Tests: No results for input(s): TSH, T4TOTAL, FREET4, T3FREE, THYROIDAB in the last 72 hours. Anemia Panel: No results for input(s): VITAMINB12, FOLATE, FERRITIN, TIBC, IRON, RETICCTPCT in the last 72 hours. Urine analysis:    Component Value Date/Time   COLORURINE YELLOW 10/23/2016 1921   APPEARANCEUR HAZY (A) 10/23/2016 1921   LABSPEC 1.010 10/23/2016 1921   PHURINE 6.0 10/23/2016 1921   GLUCOSEU NEGATIVE 10/23/2016 1921   GLUCOSEU NEGATIVE 11/24/2012 0831   HGBUR SMALL (A) 10/23/2016 1921   BILIRUBINUR NEGATIVE 10/23/2016 1921   BILIRUBINUR neg  09/19/2014 [place] 10/23/2016 1921   PROTEINUR 30 (A) 10/23/2016 1921   UROBILINOGEN 0.2 09/19/2014 1605   UROBILINOGEN 0.2 02/24/2014 0024   NITRITE NEGATIVE 10/23/2016 1921   LEUKOCYTESUR NEGATIVE 10/23/2016 1921    Radiological Exams on Admission: Ct Head Wo Contrast  Result Date: 10/23/2016 CLINICAL DATA:  Slurred speech. EXAM: CT HEAD WITHOUT CONTRAST TECHNIQUE: Contiguous axial images were obtained from the base of the skull through the vertex without intravenous contrast. COMPARISON:  None. FINDINGS: Brain: No acute infarct, hemorrhage, or mass lesion is present. The ventricles are of normal size. No significant extraaxial fluid collection is present. Mild atrophy and white matter changes are within normal limits for age. Vascular: Vascular calcifications are present. There is no hyperdense vessel. Skull: The calvarium is intact. No focal lytic or blastic lesions are present. Sinuses/Orbits: The paranasal sinuses and mastoid air cells are clear. Bilateral lens replacements are present. The globes and orbits are within normal limits. IMPRESSION: 1. Normal CT of the head for age. 2. Atherosclerotic calcifications without hyperdense vessel. Electronically Signed   By: San Morelle M.D.   On: 10/23/2016 14:04   US Abdomen Complete  Result Date: 10/23/2016 CLINICAL DATA:  Vomiting since this morning. Elevated liver function tests. History of breast cancer. EXAM: ABDOMEN ULTRASOUND COMPLETE COMPARISON:  Abdomen and pelvis CT dated 01/01/2016. FINDINGS: Gallbladder: No gallstones or wall thickening visualized. No sonographic Murphy sign noted by sonographer. The gallbladder fundus is obscured by overlying bowel gas. There was a 2.4 cm gallstone in the gallbladder on the previous CT. Common bile duct: Diameter: 4.6 mm Liver: 1.2 cm cyst. Within normal limits in parenchymal echogenicity. IVC: No abnormality visualized. Pancreas: Visualized portion unremarkable. Spleen: Size  and appearance within normal limits. Right Kidney: Length: 10.8 cm. Normal echotexture. 3.2 cm and 1.6 cm upper pole cyst. No hydronephrosis. Left Kidney: Length: 8.8 cm. 2.2 cm cyst. Normal echotexture. No hydronephrosis. Abdominal aorta: No aneurysm visualized. Other findings: None. IMPRESSION: 1. The previously demonstrated 2.4 cm gallstone in the gallbladder is not visualized today. This is most likely due to obscuration of the stone by overlying bowel gas. 2. No acute abnormality. Electronically Signed   By: Claudie Revering M.D.   On: 10/23/2016 18:10  EKG: Independently reviewed.  Assessment/Plan Principal Problem:   Transaminitis Active Problems:   DM2 (diabetes mellitus, type 2) (HCC)   Essential hypertension, benign   Breast cancer of upper-outer quadrant of right female breast (HCC)   PAF (paroxysmal atrial fibrillation) (HCC)   Sinus bradycardia    1. Transaminitis - and general illness 1. Will observe over night 2. Got 1 dose of rocephin in ED, will hold off on ordering any more ABx pending cultures, other signs / symptoms of infection 3. Blood and urine cultures pending 4. UA not that impressive this time 5. Will hydrate patient gently by holding lasix 6. Repeat CMP in AM to monitor liver and kidney function 2. Sinus bradycardia - 1. Interestingly seems to have U waves on EKG 2. K is only 3.5 however, not really that hypokalemic, will give 20 meq PO X1 now 3. Checking Mg 4. Repeat CMP in AM 5. Reduce metoprolol dose from 169m BID to 555mBID 3. HTN - continue home meds except as above 4. PAF - 1. Continue eliquis 2. Hold amiodarone for now - wasn't clear that this was the cause of liver enzyme elevation back in October however, and was resumed on discharge at that time. 3. May wish to get cards opinion on restarting this at discharge. 5. DM2 - 1. Hold home meds 2. Sensitive scale SSI AC  DVT prophylaxis: Eliquis Code Status: Full Family Communication: Daughter at  bedside Disposition Plan: Home after admit Consults called: None [facility] Pager [phone] 7AM-7PM, please contact day team taking care of patient [email or web address] Password [email or web address]  10/23/2016, 10:54 PM

## 2016-10-23 NOTE — ED Notes (Signed)
No dizziness now no nausea

## 2016-10-23 NOTE — ED Notes (Signed)
In u/s

## 2016-10-24 DIAGNOSIS — R740 Nonspecific elevation of levels of transaminase and lactic acid dehydrogenase [LDH]: Secondary | ICD-10-CM

## 2016-10-24 LAB — BLOOD CULTURE ID PANEL (REFLEXED)
Acinetobacter baumannii: NOT DETECTED
Candida albicans: NOT DETECTED
Candida glabrata: NOT DETECTED
Candida krusei: NOT DETECTED
Candida parapsilosis: NOT DETECTED
Candida tropicalis: NOT DETECTED
Carbapenem resistance: NOT DETECTED
Enterobacter cloacae complex: NOT DETECTED
Enterobacteriaceae species: DETECTED — AB
Enterococcus species: NOT DETECTED
Escherichia coli: DETECTED — AB
Haemophilus influenzae: NOT DETECTED
Klebsiella oxytoca: NOT DETECTED
Klebsiella pneumoniae: NOT DETECTED
Listeria monocytogenes: NOT DETECTED
Methicillin resistance: NOT DETECTED
Neisseria meningitidis: NOT DETECTED
Proteus species: NOT DETECTED
Pseudomonas aeruginosa: NOT DETECTED
Serratia marcescens: NOT DETECTED
Staphylococcus aureus (BCID): NOT DETECTED
Staphylococcus species: NOT DETECTED
Streptococcus agalactiae: NOT DETECTED
Streptococcus pneumoniae: NOT DETECTED
Streptococcus pyogenes: NOT DETECTED
Streptococcus species: NOT DETECTED
Vancomycin resistance: NOT DETECTED

## 2016-10-24 LAB — COMPREHENSIVE METABOLIC PANEL
ALT: 568 U/L — ABNORMAL HIGH (ref 14–54)
AST: 632 U/L — ABNORMAL HIGH (ref 15–41)
Albumin: 2.7 g/dL — ABNORMAL LOW (ref 3.5–5.0)
Alkaline Phosphatase: 45 U/L (ref 38–126)
Anion gap: 8 (ref 5–15)
BUN: 27 mg/dL — ABNORMAL HIGH (ref 6–20)
CO2: 29 mmol/L (ref 22–32)
Calcium: 8.9 mg/dL (ref 8.9–10.3)
Chloride: 99 mmol/L — ABNORMAL LOW (ref 101–111)
Creatinine, Ser: 1.65 mg/dL — ABNORMAL HIGH (ref 0.44–1.00)
GFR calc Af Amer: 31 mL/min — ABNORMAL LOW (ref >60)
GFR calc non Af Amer: 27 mL/min — ABNORMAL LOW (ref >60)
Glucose, Bld: 210 mg/dL — ABNORMAL HIGH (ref 65–99)
Potassium: 3.7 mmol/L (ref 3.5–5.1)
Sodium: 136 mmol/L (ref 135–145)
Total Bilirubin: 0.7 mg/dL (ref 0.3–1.2)
Total Protein: 5.4 g/dL — ABNORMAL LOW (ref 6.5–8.1)

## 2016-10-24 LAB — MRSA PCR SCREENING: MRSA by PCR: NEGATIVE

## 2016-10-24 LAB — GLUCOSE, CAPILLARY
Glucose-Capillary: 139 mg/dL — ABNORMAL HIGH (ref 65–99)
Glucose-Capillary: 150 mg/dL — ABNORMAL HIGH (ref 65–99)
Glucose-Capillary: 162 mg/dL — ABNORMAL HIGH (ref 65–99)
Glucose-Capillary: 167 mg/dL — ABNORMAL HIGH (ref 65–99)
Glucose-Capillary: 173 mg/dL — ABNORMAL HIGH (ref 65–99)

## 2016-10-24 LAB — CBC
HCT: 30.9 % — ABNORMAL LOW (ref 36.0–46.0)
Hemoglobin: 10.4 g/dL — ABNORMAL LOW (ref 12.0–15.0)
MCH: 30.6 pg (ref 26.0–34.0)
MCHC: 33.7 g/dL (ref 30.0–36.0)
MCV: 90.9 fL (ref 78.0–100.0)
Platelets: 125 10*3/uL — ABNORMAL LOW (ref 150–400)
RBC: 3.4 MIL/uL — ABNORMAL LOW (ref 3.87–5.11)
RDW: 15.6 % — ABNORMAL HIGH (ref 11.5–15.5)
WBC: 8.5 10*3/uL (ref 4.0–10.5)

## 2016-10-24 LAB — MAGNESIUM: Magnesium: 2 mg/dL (ref 1.7–2.4)

## 2016-10-24 MED ORDER — APIXABAN 5 MG PO TABS
5.0000 mg | ORAL_TABLET | Freq: Two times a day (BID) | ORAL | Status: DC
  Administered 2016-10-24 – 2016-10-26 (×4): 5 mg via ORAL
  Filled 2016-10-24 (×4): qty 1

## 2016-10-24 MED ORDER — DEXTROSE 5 % IV SOLN
2.0000 g | INTRAVENOUS | Status: DC
  Administered 2016-10-24 – 2016-10-25 (×2): 2 g via INTRAVENOUS
  Filled 2016-10-24 (×3): qty 2

## 2016-10-24 MED ORDER — APIXABAN 2.5 MG PO TABS: 2.5000 mg | ORAL_TABLET | Freq: Two times a day (BID) | ORAL | Status: DC

## 2016-10-24 MED ORDER — DEXTROSE 5 % IV SOLN
1.0000 g | INTRAVENOUS | Status: DC
  Filled 2016-10-24: qty 10

## 2016-10-24 MED ORDER — METOPROLOL SUCCINATE ER 25 MG PO TB24
25.0000 mg | ORAL_TABLET | Freq: Two times a day (BID) | ORAL | Status: DC
  Administered 2016-10-25 – 2016-10-26 (×2): 25 mg via ORAL
  Filled 2016-10-24 (×4): qty 1

## 2016-10-24 MED ORDER — SODIUM CHLORIDE 0.9 % IV SOLN
INTRAVENOUS | Status: DC
  Administered 2016-10-24 – 2016-10-25 (×2): via INTRAVENOUS

## 2016-10-24 NOTE — Progress Notes (Signed)
PROGRESS NOTE  ALEXYSS BALZARINI HGD:[id number] DOB: 1927/08/19 DOA: 10/23/2016 PCP: [name], MD  HPI/Recap of past 24 hours:  She denies pain bp and heart rate low normal , holding home bp meds, getting ivf She is not eating much, liver function worsened. daughter at bedside  Assessment/Plan: Principal Problem:   Transaminitis Active Problems:   DM2 (diabetes mellitus, type 2) (HCC)   Essential hypertension, benign   Breast cancer of upper-outer quadrant of right female breast ([place])   PAF (paroxysmal atrial fibrillation) (HCC)   Sinus bradycardia   Sepsis presented on admission with leukocytosis , lactic acidosis, elevated lft and cr source of infect from ecoli bacteremia/ uti: Similar presentation as of last year when she was admitted to hospital in 12/2015 Follow up on final culture result, continue iv rocephin/ivf Hold bp meds as borderline hypotension and bradycardia  Elevated lft, likely from sepsis -Ct ab in 2017 "Hepatobiliary: Scattered small hypodensities are noted within the liver, nonspecific but possibly reflecting small cysts. A large stone is noted within the gallbladder. The gallbladder is otherwise unremarkable. The common bile duct remains normal in caliber.: -expect improvement with treating sepsis, if remain elevated will check ab [place] -hepatitis panel negative in 12/2015, will not repeat. -hold amiodarone and statin for now  PAF, sinus bradycardia on admission with borderline blood pressure: -hold amiodarone due to elevated lft -Hold norvasc/ lisinopril - reduced betablocker, continue elliquis  Chronic diastolic chf: currently euvolemic to dry Hold lasix, gentle hydration, close monitor volume status   HTN, now with sepsis, meds adjustment as above  noninsulin dependent dm2;  Hold home oral meds, on ssi here, check a1c   CKDIII, cr slightly elevated from baseline Renal dosing meds, avoid hypotension  H/o breast cancer, continue  tamoxifen  Code Status: full  Family Communication: patient and daughter  Disposition Plan: home in 1-2 days , once culture result finalized, lft improving   Consultants:  none  Procedures:  none  Antibiotics:  Rocephin from 8/10   Objective: BP (!) 137/57 (BP Location: Left Arm)   Pulse (!) 56   Temp 98.7 F (37.1 C) (Oral)   Resp 18   Wt 79.8 kg (176 lb)   SpO2 98%   BMI 27.98 kg/m   Intake/Output Summary (Last 24 hours) at 10/24/16 0845 Last data filed at 10/23/16 2250  Gross per 24 hour  Intake               50 ml  Output                0 ml  Net               50 ml   Filed Weights   10/24/16 0509  Weight: 79.8 kg (176 lb)    Exam: Patient is examined daily including today on 10/24/2016, exam remain the same as of yesterday except that is highlighted below   General:  Frail elderly NAD  Cardiovascular: sinus bradycardia  Respiratory: CTABL  Abdomen: Soft/ND/NT, positive BS  Musculoskeletal: No Edema  Neuro: aaox3  Data Reviewed: Basic Metabolic Panel:  Recent Labs Lab 10/23/16 1317 10/23/16 1338 10/24/16 0018  NA 136 136 136  K 3.5 3.5 3.7  CL 101 98* 99*  CO2 26  --  29  GLUCOSE 212* 210* 210*  BUN 24* 26* 27*  CREATININE 1.45* 1.40* 1.65*  CALCIUM 9.0  --  8.9  MG  --   --  2.0   Liver Function  Tests:  Recent Labs Lab 10/23/16 1317 10/24/16 0018  AST 305* 632*  ALT 262* 568*  ALKPHOS 51 45  BILITOT 1.0 0.7  PROT 6.4* 5.4*  ALBUMIN 3.1* 2.7*   No results for input(s): LIPASE, AMYLASE in the last 168 hours. No results for input(s): AMMONIA in the last 168 hours. CBC:  Recent Labs Lab 10/23/16 1317 10/23/16 1338 10/24/16 0018  WBC 11.2*  --  8.5  NEUTROABS 10.3*  --   --   HGB 11.4* 11.6* 10.4*  HCT 34.9* 34.0* 30.9*  MCV 90.6  --  90.9  PLT 136*  --  125*   Cardiac Enzymes:   No results for input(s): CKTOTAL, CKMB, CKMBINDEX, TROPONINI in the last 168 hours. BNP (last 3 results) No results for input(s):  BNP in the last 8760 hours.  ProBNP (last 3 results) No results for input(s): PROBNP in the last 8760 hours.  CBG:  Recent Labs Lab 10/24/16 0038  GLUCAP 173*    No results found for this or any previous visit (from the past 240 hour(s)).   Studies: Ct Head Wo Contrast  Result Date: 10/23/2016 CLINICAL DATA:  Slurred speech. EXAM: CT HEAD WITHOUT CONTRAST TECHNIQUE: Contiguous axial images were obtained from the base of the skull through the vertex without intravenous contrast. COMPARISON:  None. FINDINGS: Brain: No acute infarct, hemorrhage, or mass lesion is present. The ventricles are of normal size. No significant extraaxial fluid collection is present. Mild atrophy and white matter changes are within normal limits for age. Vascular: Vascular calcifications are present. There is no hyperdense vessel. Skull: The calvarium is intact. No focal lytic or blastic lesions are present. Sinuses/Orbits: The paranasal sinuses and mastoid air cells are clear. Bilateral lens replacements are present. The globes and orbits are within normal limits. IMPRESSION: 1. Normal CT of the head for age. 2. Atherosclerotic calcifications without hyperdense vessel. Electronically Signed   By: San Morelle M.D.   On: 10/23/2016 14:04   US Abdomen Complete  Result Date: 10/23/2016 CLINICAL DATA:  Vomiting since this morning. Elevated liver function tests. History of breast cancer. EXAM: ABDOMEN ULTRASOUND COMPLETE COMPARISON:  Abdomen and pelvis CT dated 01/01/2016. FINDINGS: Gallbladder: No gallstones or wall thickening visualized. No sonographic Murphy sign noted by sonographer. The gallbladder fundus is obscured by overlying bowel gas. There was a 2.4 cm gallstone in the gallbladder on the previous CT. Common bile duct: Diameter: 4.6 mm Liver: 1.2 cm cyst. Within normal limits in parenchymal echogenicity. IVC: No abnormality visualized. Pancreas: Visualized portion unremarkable. Spleen: Size and appearance  within normal limits. Right Kidney: Length: 10.8 cm. Normal echotexture. 3.2 cm and 1.6 cm upper pole cyst. No hydronephrosis. Left Kidney: Length: 8.8 cm. 2.2 cm cyst. Normal echotexture. No hydronephrosis. Abdominal aorta: No aneurysm visualized. Other findings: None. IMPRESSION: 1. The previously demonstrated 2.4 cm gallstone in the gallbladder is not visualized today. This is most likely due to obscuration of the stone by overlying bowel gas. 2. No acute abnormality. Electronically Signed   By: Claudie Revering M.D.   On: 10/23/2016 18:10    Scheduled Meds: . amLODipine  5 mg Oral Daily  . apixaban  5 mg Oral BID  . atorvastatin  10 mg Oral Daily  . doxazosin  4 mg Oral Daily  . insulin aspart  0-9 Units Subcutaneous TID WC  . lisinopril  20 mg Oral Daily  . metoprolol succinate  50 mg Oral BID  . polyvinyl alcohol   Both Eyes TID  .  tamoxifen  20 mg Oral Daily    Continuous Infusions:   Time spent: 35 mins I have personally reviewed and interpreted daily labs, tele strips, imagings as discussed above under date review session and assessment and plans. Outpatient records, past discharge summaries,  Updated family at length at bedside  Alisabeth Selkirk MD, PhD  [facility] Pager [phone]. If 7PM-7AM, please contact night-coverage at [email or web address], password [facility] 10/24/2016, 8:45 AM  LOS: 0 days

## 2016-10-24 NOTE — Care Management Note (Signed)
Case Management Note  Patient Details  Name: Whitney [name] MRN: [id number] Date of Birth: 1927-07-07  Subjective/Objective:  Spoke to  pt and granddaughter in room,  both agree no HH needed.                   Action/Plan: CM will sign off for now but will be available should additional discharge needs arise or disposition change.    Expected Discharge Date:                  Expected Discharge Plan:     In-House Referral:     Discharge planning Services  CM Consult  Post Acute Care Choice:    Choice offered to:  Patient, Adult Children  DME Arranged:    DME Agency:     HH Arranged:  Patient Refused [place] Agency:     Status of Service:  Completed, signed off  If discussed at [place] of Stay Meetings, dates discussed:    Additional Comments:  Delrae Sawyers, RN 10/24/2016, 3:59 PM

## 2016-10-24 NOTE — Care Management Obs Status (Signed)
[place] NOTIFICATION   Patient Details  Name: JEMILA CAMILLE MRN: [id number] Date of Birth: November 01, 1927   Medicare Observation Status Notification Given:  Yes    CrutchfieldAntony Haste, RN 10/24/2016, 3:35 PM

## 2016-10-24 NOTE — Progress Notes (Signed)
PHARMACY - PHYSICIAN COMMUNICATION CRITICAL VALUE ALERT - BLOOD CULTURE IDENTIFICATION (BCID)  Results for orders placed or performed during the hospital encounter of 12/29/15  Blood Culture ID Panel (Reflexed) (Collected: 12/29/2015  5:05 PM)  Result Value Ref Range   Enterococcus species NOT DETECTED NOT DETECTED   Listeria monocytogenes NOT DETECTED NOT DETECTED   Staphylococcus species NOT DETECTED NOT DETECTED   Staphylococcus aureus NOT DETECTED NOT DETECTED   Streptococcus species NOT DETECTED NOT DETECTED   Streptococcus agalactiae NOT DETECTED NOT DETECTED   Streptococcus pneumoniae NOT DETECTED NOT DETECTED   Streptococcus pyogenes NOT DETECTED NOT DETECTED   Acinetobacter baumannii NOT DETECTED NOT DETECTED   Enterobacteriaceae species DETECTED (A) NOT DETECTED   Enterobacter cloacae complex NOT DETECTED NOT DETECTED   Escherichia coli DETECTED (A) NOT DETECTED   Klebsiella oxytoca NOT DETECTED NOT DETECTED   Klebsiella pneumoniae NOT DETECTED NOT DETECTED   Proteus species NOT DETECTED NOT DETECTED   Serratia marcescens NOT DETECTED NOT DETECTED   Carbapenem resistance NOT DETECTED NOT DETECTED   Haemophilus influenzae NOT DETECTED NOT DETECTED   Neisseria meningitidis NOT DETECTED NOT DETECTED   Pseudomonas aeruginosa NOT DETECTED NOT DETECTED   Candida albicans NOT DETECTED NOT DETECTED   Candida glabrata NOT DETECTED NOT DETECTED   Candida krusei NOT DETECTED NOT DETECTED   Candida parapsilosis NOT DETECTED NOT DETECTED   Candida tropicalis NOT DETECTED NOT DETECTED    Name of physician (or Provider) Contacted: Dr. Erlinda Hong  Changes to prescribed antibiotics required: Ordered for ceftriaxone 1g q24 - will increase dose to 2g q24 for bacteremia.   Deboraha Sprang 10/24/2016  5:59 PM

## 2016-10-25 DIAGNOSIS — R627 Adult failure to thrive: Secondary | ICD-10-CM | POA: Diagnosis present

## 2016-10-25 DIAGNOSIS — Z7901 Long term (current) use of anticoagulants: Secondary | ICD-10-CM | POA: Diagnosis not present

## 2016-10-25 DIAGNOSIS — Z7984 Long term (current) use of oral hypoglycemic drugs: Secondary | ICD-10-CM | POA: Diagnosis not present

## 2016-10-25 DIAGNOSIS — I130 Hypertensive heart and chronic kidney disease with heart failure and stage 1 through stage 4 chronic kidney disease, or unspecified chronic kidney disease: Secondary | ICD-10-CM | POA: Diagnosis present

## 2016-10-25 DIAGNOSIS — C50411 Malignant neoplasm of upper-outer quadrant of right female breast: Secondary | ICD-10-CM | POA: Diagnosis present

## 2016-10-25 DIAGNOSIS — E042 Nontoxic multinodular goiter: Secondary | ICD-10-CM | POA: Diagnosis present

## 2016-10-25 DIAGNOSIS — Z79810 Long term (current) use of selective estrogen receptor modulators (SERMs): Secondary | ICD-10-CM | POA: Diagnosis not present

## 2016-10-25 DIAGNOSIS — N390 Urinary tract infection, site not specified: Secondary | ICD-10-CM | POA: Diagnosis present

## 2016-10-25 DIAGNOSIS — I5032 Chronic diastolic (congestive) heart failure: Secondary | ICD-10-CM | POA: Diagnosis present

## 2016-10-25 DIAGNOSIS — R42 Dizziness and giddiness: Secondary | ICD-10-CM | POA: Diagnosis present

## 2016-10-25 DIAGNOSIS — R7881 Bacteremia: Secondary | ICD-10-CM | POA: Diagnosis present

## 2016-10-25 DIAGNOSIS — Z87891 Personal history of nicotine dependence: Secondary | ICD-10-CM | POA: Diagnosis not present

## 2016-10-25 DIAGNOSIS — R740 Nonspecific elevation of levels of transaminase and lactic acid dehydrogenase [LDH]: Secondary | ICD-10-CM | POA: Diagnosis not present

## 2016-10-25 DIAGNOSIS — E039 Hypothyroidism, unspecified: Secondary | ICD-10-CM | POA: Diagnosis present

## 2016-10-25 DIAGNOSIS — Z9109 Other allergy status, other than to drugs and biological substances: Secondary | ICD-10-CM | POA: Diagnosis not present

## 2016-10-25 DIAGNOSIS — K7689 Other specified diseases of liver: Secondary | ICD-10-CM | POA: Diagnosis present

## 2016-10-25 DIAGNOSIS — E872 Acidosis: Secondary | ICD-10-CM | POA: Diagnosis present

## 2016-10-25 DIAGNOSIS — B9620 Unspecified Escherichia coli [E. coli] as the cause of diseases classified elsewhere: Secondary | ICD-10-CM | POA: Diagnosis present

## 2016-10-25 DIAGNOSIS — E785 Hyperlipidemia, unspecified: Secondary | ICD-10-CM | POA: Diagnosis present

## 2016-10-25 DIAGNOSIS — R001 Bradycardia, unspecified: Secondary | ICD-10-CM | POA: Diagnosis present

## 2016-10-25 DIAGNOSIS — A419 Sepsis, unspecified organism: Secondary | ICD-10-CM | POA: Diagnosis present

## 2016-10-25 DIAGNOSIS — N183 Chronic kidney disease, stage 3 (moderate): Secondary | ICD-10-CM | POA: Diagnosis present

## 2016-10-25 DIAGNOSIS — R7989 Other specified abnormal findings of blood chemistry: Secondary | ICD-10-CM | POA: Diagnosis present

## 2016-10-25 DIAGNOSIS — Z90710 Acquired absence of both cervix and uterus: Secondary | ICD-10-CM | POA: Diagnosis not present

## 2016-10-25 DIAGNOSIS — E1122 Type 2 diabetes mellitus with diabetic chronic kidney disease: Secondary | ICD-10-CM | POA: Diagnosis present

## 2016-10-25 DIAGNOSIS — I480 Paroxysmal atrial fibrillation: Secondary | ICD-10-CM | POA: Diagnosis present

## 2016-10-25 LAB — CBC WITH DIFFERENTIAL/PLATELET
Basophils Absolute: 0 10*3/uL (ref 0.0–0.1)
Basophils Relative: 0 %
Eosinophils Absolute: 0 10*3/uL (ref 0.0–0.7)
Eosinophils Relative: 1 %
HCT: 31.8 % — ABNORMAL LOW (ref 36.0–46.0)
Hemoglobin: 10.4 g/dL — ABNORMAL LOW (ref 12.0–15.0)
Lymphocytes Relative: 5 %
Lymphs Abs: 0.3 10*3/uL — ABNORMAL LOW (ref 0.7–4.0)
MCH: 29.7 pg (ref 26.0–34.0)
MCHC: 32.7 g/dL (ref 30.0–36.0)
MCV: 90.9 fL (ref 78.0–100.0)
Monocytes Absolute: 0.8 10*3/uL (ref 0.1–1.0)
Monocytes Relative: 15 %
Neutro Abs: 4.1 10*3/uL (ref 1.7–7.7)
Neutrophils Relative %: 79 %
Platelets: 112 10*3/uL — ABNORMAL LOW (ref 150–400)
RBC: 3.5 MIL/uL — ABNORMAL LOW (ref 3.87–5.11)
RDW: 15.5 % (ref 11.5–15.5)
WBC: 5.1 10*3/uL (ref 4.0–10.5)

## 2016-10-25 LAB — COMPREHENSIVE METABOLIC PANEL
ALT: 689 U/L — ABNORMAL HIGH (ref 14–54)
AST: 520 U/L — ABNORMAL HIGH (ref 15–41)
Albumin: 2.4 g/dL — ABNORMAL LOW (ref 3.5–5.0)
Alkaline Phosphatase: 44 U/L (ref 38–126)
Anion gap: 7 (ref 5–15)
BUN: 31 mg/dL — ABNORMAL HIGH (ref 6–20)
CO2: 28 mmol/L (ref 22–32)
Calcium: 8.9 mg/dL (ref 8.9–10.3)
Chloride: 103 mmol/L (ref 101–111)
Creatinine, Ser: 1.54 mg/dL — ABNORMAL HIGH (ref 0.44–1.00)
GFR calc Af Amer: 34 mL/min — ABNORMAL LOW (ref >60)
GFR calc non Af Amer: 29 mL/min — ABNORMAL LOW (ref >60)
Glucose, Bld: 143 mg/dL — ABNORMAL HIGH (ref 65–99)
Potassium: 3.8 mmol/L (ref 3.5–5.1)
Sodium: 138 mmol/L (ref 135–145)
Total Bilirubin: 0.8 mg/dL (ref 0.3–1.2)
Total Protein: 4.9 g/dL — ABNORMAL LOW (ref 6.5–8.1)

## 2016-10-25 LAB — GLUCOSE, CAPILLARY
Glucose-Capillary: 138 mg/dL — ABNORMAL HIGH (ref 65–99)
Glucose-Capillary: 157 mg/dL — ABNORMAL HIGH (ref 65–99)
Glucose-Capillary: 184 mg/dL — ABNORMAL HIGH (ref 65–99)
Glucose-Capillary: 190 mg/dL — ABNORMAL HIGH (ref 65–99)

## 2016-10-25 LAB — PROTIME-INR
INR: 2.64
Prothrombin Time: 28.7 seconds — ABNORMAL HIGH (ref 11.4–15.2)

## 2016-10-25 LAB — AMMONIA: Ammonia: 19 umol/L (ref 9–35)

## 2016-10-25 MED ORDER — AMLODIPINE BESYLATE 5 MG PO TABS
5.0000 mg | ORAL_TABLET | Freq: Every day | ORAL | Status: DC
  Administered 2016-10-25 – 2016-10-26 (×2): 5 mg via ORAL
  Filled 2016-10-25 (×2): qty 1

## 2016-10-25 NOTE — Progress Notes (Signed)
PROGRESS NOTE  Whitney [name] FWY:[id number] DOB: 11/06/1927 DOA: 10/23/2016 PCP: [name], MD  HPI/Recap of past 24 hours:  Report feeling better, denies pain or sob at rest, no fever Sinus Bradycardia on tele, blood pressure has improved, She is not eating much, liver function remain elevated Whitney [name] at bedside  Whitney [name] at bedside  Assessment/Plan: Principal Problem:   Transaminitis Active Problems:   DM2 (diabetes mellitus, type 2) ([place])   Essential hypertension, benign   Breast cancer of upper-outer quadrant of right female breast ([place])   PAF (paroxysmal atrial fibrillation) (H[facility])   Sinus bradycardia   Sepsis presented on admission with leukocytosis , lactic acidosis, elevated lft and cr source of infect from ecoli bacteremia/ uti: Similar presentation as of last year when she was admitted to hospital in 12/2015 Blood culuture + ecoli, sensitivity pending,  continue iv rocephin 2g q24hr bp start to improve, resume norvasc, continue cardura ,  hold lisinopril.  Continue reduced dose of betablocker due to sinus bradycardia.  Elevated lft, likely from sepsis -Ct ab in 2017 "Hepatobiliary: Scattered small hypodensities are noted within the liver, nonspecific but possibly reflecting small cysts. A large stone is noted within the gallbladder. The gallbladder is otherwise unremarkable. The common bile duct remains normal in caliber.: -lft remain elevated,   ab [place] did not show acute abnormalities  -hepatitis panel negative in 12/2015, will not repeat. -hold amiodarone and statin for now, repeat cmp in am, avoid hepatic toxin  PAF, sinus bradycardia : -hold amiodarone due to elevated lft -reduced betablocker, continue elliquis  Chronic diastolic chf: currently euvolemic to dry Hold lasix held since admission, she received gentle hydration, now off ivf, close monitor volume status   HTN, now with sepsis, meds adjustment as above  noninsulin dependent dm2;    Hold home oral meds, on ssi here,  a1c pending   CKDIII, cr slightly elevated from baseline Renal dosing meds, avoid hypotension Continue hold lisinopril  H/o breast cancer, continue tamoxifen  FTT: will get PT  Code Status: full  Family Communication: patient and Whitney [name]  Disposition Plan: home in 1-2 days , once culture result finalized, lft improving   Consultants:  none  Procedures:  none  Antibiotics:  Rocephin from 8/10   Objective: BP 140/62 (BP Location: Left Arm)   Pulse (!) 47   Temp 98.6 F (37 C) (Oral)   Resp 15   Wt 79.8 kg (176 lb)   SpO2 97%   BMI 27.98 kg/m   Intake/Output Summary (Last 24 hours) at 10/25/16 0810 Last data filed at 10/24/16 1700  Gross per 24 hour  Intake            155.5 ml  Output                0 ml  Net            155.5 ml   Filed Weights   10/24/16 0509  Weight: 79.8 kg (176 lb)    Exam: Patient is examined daily including today on 10/25/2016, exam remain the same as of yesterday except that is highlighted below   General:  Frail elderly, NAD  Cardiovascular: sinus bradycardia  Respiratory: CTABL  Abdomen: Soft/ND/NT, positive BS  Musculoskeletal: No Edema  Neuro: aaox3  Data Reviewed: Basic Metabolic Panel:  Recent Labs Lab 10/23/16 1317 10/23/16 1338 10/24/16 0018 10/25/16 0443  NA 136 136 136 138  K 3.5 3.5 3.7 3.8  CL 101 98* 99* 103  CO2  26  --  29 28  GLUCOSE 212* 210* 210* 143*  BUN 24* 26* 27* 31*  CREATININE 1.45* 1.40* 1.65* 1.54*  CALCIUM 9.0  --  8.9 8.9  MG  --   --  2.0  --    Liver Function Tests:  Recent Labs Lab 10/23/16 1317 10/24/16 0018 10/25/16 0443  AST 305* 632* 520*  ALT 262* 568* 689*  ALKPHOS 51 45 44  BILITOT 1.0 0.7 0.8  PROT 6.4* 5.4* 4.9*  ALBUMIN 3.1* 2.7* 2.4*   No results for input(s): LIPASE, AMYLASE in the last 168 hours.  Recent Labs Lab 10/25/16 0443  AMMONIA 19   CBC:  Recent Labs Lab 10/23/16 1317 10/23/16 1338  10/24/16 0018 10/25/16 0443  WBC 11.2*  --  8.5 5.1  NEUTROABS 10.3*  --   --  4.1  HGB 11.4* 11.6* 10.4* 10.4*  HCT 34.9* 34.0* 30.9* 31.8*  MCV 90.6  --  90.9 90.9  PLT 136*  --  125* 112*   Cardiac Enzymes:   No results for input(s): CKTOTAL, CKMB, CKMBINDEX, TROPONINI in the last 168 hours. BNP (last 3 results) No results for input(s): BNP in the last 8760 hours.  ProBNP (last 3 results) No results for input(s): PROBNP in the last 8760 hours.  CBG:  Recent Labs Lab 10/24/16 0038 10/24/16 1011 10/24/16 1308 10/24/16 1831 10/24/16 2034  GLUCAP 173* 167* 150* 139* 162*    Recent Results (from the past 240 hour(s))  Culture, blood (routine x 2)     Status: None (Preliminary result)   Collection Time: 10/23/16 10:00 PM  Result Value Ref Range Status   Specimen Description BLOOD RIGHT WRIST  Final   Special Requests IN PEDIATRIC BOTTLE Blood Culture adequate volume  Final   Culture  Setup Time   Final    GRAM NEGATIVE RODS IN PEDIATRIC BOTTLE CRITICAL RESULT CALLED TO, READ BACK BY AND VERIFIED WITH: Dianna Rossetti MARKLE [id number] MLM    Culture GRAM NEGATIVE RODS  Final   Report Status PENDING  Incomplete  Blood Culture ID Panel (Reflexed)     Status: Abnormal   Collection Time: 10/23/16 10:00 PM  Result Value Ref Range Status   Enterococcus species NOT DETECTED NOT DETECTED Final   Vancomycin resistance NOT DETECTED NOT DETECTED Final   Listeria monocytogenes NOT DETECTED NOT DETECTED Final   Staphylococcus species NOT DETECTED NOT DETECTED Final   Staphylococcus aureus NOT DETECTED NOT DETECTED Final   Methicillin resistance NOT DETECTED NOT DETECTED Final   Streptococcus species NOT DETECTED NOT DETECTED Final   Streptococcus agalactiae NOT DETECTED NOT DETECTED Final   Streptococcus pneumoniae NOT DETECTED NOT DETECTED Final   Streptococcus pyogenes NOT DETECTED NOT DETECTED Final   Acinetobacter baumannii NOT DETECTED NOT DETECTED Final   Enterobacteriaceae  species DETECTED (A) NOT DETECTED Final    Comment: CRITICAL RESULT CALLED TO, READ BACK BY AND VERIFIED WITH: PHARMD J MARKLE [id number] MLM    Enterobacter cloacae complex NOT DETECTED NOT DETECTED Final   Escherichia coli DETECTED (A) NOT DETECTED Final    Comment: CRITICAL RESULT CALLED TO, READ BACK BY AND VERIFIED WITH: PHARMD J MARKLE [id number] MLM    Klebsiella oxytoca NOT DETECTED NOT DETECTED Final   Klebsiella pneumoniae NOT DETECTED NOT DETECTED Final   Proteus species NOT DETECTED NOT DETECTED Final   Serratia marcescens NOT DETECTED NOT DETECTED Final   Carbapenem resistance NOT DETECTED NOT DETECTED Final   Haemophilus influenzae NOT DETECTED NOT  DETECTED Final   Neisseria meningitidis NOT DETECTED NOT DETECTED Final   Pseudomonas aeruginosa NOT DETECTED NOT DETECTED Final   Candida albicans NOT DETECTED NOT DETECTED Final   Candida glabrata NOT DETECTED NOT DETECTED Final   Candida krusei NOT DETECTED NOT DETECTED Final   Candida parapsilosis NOT DETECTED NOT DETECTED Final   Candida tropicalis NOT DETECTED NOT DETECTED Final  MRSA PCR Screening     Status: None   Collection Time: 10/24/16  8:52 AM  Result Value Ref Range Status   MRSA by PCR NEGATIVE NEGATIVE Final    Comment:        The GeneXpert MRSA Assay (FDA approved for NASAL specimens only), is one component of a comprehensive MRSA colonization surveillance program. It is not intended to diagnose MRSA infection nor to guide or monitor treatment for MRSA infections.      Studies: No results found.  Scheduled Meds: . apixaban  5 mg Oral BID  . doxazosin  4 mg Oral Daily  . insulin aspart  0-9 Units Subcutaneous TID WC  . metoprolol succinate  25 mg Oral BID  . polyvinyl alcohol   Both Eyes TID  . tamoxifen  20 mg Oral Daily    Continuous Infusions: . cefTRIAXone (ROCEPHIN)  IV Stopped (10/24/16 2203)     Time spent: 25 mins I have personally reviewed and interpreted daily labs, tele  strips, imagings as discussed above under date review session and assessment and plans. Updated family at length at bedside  Ashlynne Shetterly MD, PhD  [facility] Pager [phone]. If 7PM-7AM, please contact night-coverage at [email or web address], password [facility] 10/25/2016, 8:10 AM  LOS: 0 days

## 2016-10-25 NOTE — Evaluation (Signed)
Physical Therapy Evaluation Patient Details Name: Whitney [name] MRN: [id number] DOB: 12/21/1927 Today's Date: 10/25/2016   History of Present Illness  Patient is an 83 yo female admitted 10/23/16 with dizziness, vomiting, and weakness.  Patient with transaminitis, bradycardia.   PMH:  Afib, DM, anemia, HTN, HLD, CHF, CKD  Clinical Impression  Patient presents with problems listed below.  Will benefit from acute PT to maximize functional mobility prior to discharge home.  Patient should return to baseline quickly - do not anticipate any f/u PT needs at d/c.    Follow Up Recommendations No PT follow up;Supervision for mobility/OOB    Equipment Recommendations  None recommended by PT    Recommendations for Other Services       Precautions / Restrictions Precautions Precautions: None Restrictions Weight Bearing Restrictions: No      Mobility  Bed Mobility Overal bed mobility: Modified Independent             General bed mobility comments: Increased time and use of rail  Transfers Overall transfer level: Needs assistance Equipment used: Rolling walker (2 wheeled) Transfers: Sit to/from Stand Sit to Stand: Min guard         General transfer comment: Patient using proper hand placement.  Assist for safety.  Ambulation/Gait Ambulation/Gait assistance: Min guard Ambulation Distance (Feet): 92 Feet Assistive device: Quad cane Gait Pattern/deviations: Step-through pattern;Decreased step length - right;Decreased step length - left;Decreased stride length;Shuffle;Trunk flexed;Drifts right/left Gait velocity: decreased Gait velocity interpretation: Below normal speed for age/gender General Gait Details: Patient with slow, steady gait with quad cane.  Assist for safety.  Stairs            Wheelchair Mobility    Modified Rankin (Stroke Patients Only)       Balance Overall balance assessment: Needs assistance Sitting-balance support: No upper extremity  supported;Feet supported Sitting balance-Leahy Scale: Good     Standing balance support: Single extremity supported Standing balance-Leahy Scale: Poor                               Pertinent Vitals/Pain Pain Assessment: No/denies pain    Home Living Family/patient expects to be discharged to:: Private residence Living Arrangements: Other relatives (Granddaughter) Available Help at Discharge: Family;Available 24 hours/day (Daughter and granddaughter) Type of Home: House Home Access: Ramped entrance     Home Layout: One level Home Equipment: Environmental consultant - 2 wheels;Cane - single point;Cane - quad;Bedside commode;Shower seat      Prior Function Level of Independence: Independent with assistive device(s);Needs assistance   Gait / Transfers Assistance Needed: Patient uses quad cane for ambulation  ADL's / Homemaking Assistance Needed: Independent with bathing - someone in house for safety.  Assist with meals and housekeeping.  Comments: Does not drive.     Hand Dominance   Dominant Hand: Right    Extremity/Trunk Assessment   Upper Extremity Assessment Upper Extremity Assessment: Overall WFL for tasks assessed    Lower Extremity Assessment Lower Extremity Assessment: Generalized weakness;RLE deficits/detail RLE Deficits / Details: Patient with knee pain at times.  Recently had injection in knee and "feels better"       Communication   Communication: No difficulties  Cognition Arousal/Alertness: Awake/alert Behavior During Therapy: WFL for tasks assessed/performed Overall Cognitive Status: Within Functional Limits for tasks assessed  General Comments      Exercises     Assessment/Plan    PT Assessment Patient needs continued PT services  PT Problem List Decreased strength;Decreased balance;Decreased mobility       PT Treatment Interventions DME instruction;Gait training;Functional mobility  training;Therapeutic activities;Therapeutic exercise;Patient/family education    PT Goals (Current goals can be found in the Care Plan section)  Acute Rehab PT Goals Patient Stated Goal: To go home soon PT Goal Formulation: With patient/family Time For Goal Achievement: 11/01/16 Potential to Achieve Goals: Good    Frequency Min 3X/week   Barriers to discharge        Co-evaluation               AM-PAC PT "6 Clicks" Daily Activity  Outcome Measure Difficulty turning over in bed (including adjusting bedclothes, sheets and blankets)?: None Difficulty moving from lying on back to sitting on the side of the bed? : None Difficulty sitting down on and standing up from a chair with arms (e.g., wheelchair, bedside commode, etc,.)?: None Help needed moving to and from a bed to chair (including a wheelchair)?: A Little Help needed walking in hospital room?: A Little Help needed climbing 3-5 steps with a railing? : A Little 6 Click Score: 21    End of Session Equipment Utilized During Treatment: Gait belt Activity Tolerance: Patient tolerated treatment well Patient left: in bed;with call bell/phone within reach;with bed alarm set;with family/visitor present Nurse Communication: Mobility status PT Visit Diagnosis: Unsteadiness on feet (R26.81);Muscle weakness (generalized) (M62.81);Difficulty in walking, not elsewhere classified (R26.2)    Time: 1650-1706 PT Time Calculation (min) (ACUTE ONLY): 16 min   Charges:   PT Evaluation $PT Eval Moderate Complexity: 1 Mod     PT G Codes:        Carita Pian. Sanjuana Kava, [facility] Acute Rehab Services Pager [place] 10/25/2016, 9:17 PM

## 2016-10-26 ENCOUNTER — Ambulatory Visit: Payer: Medicare Other | Admitting: Endocrinology

## 2016-10-26 LAB — CULTURE, BLOOD (ROUTINE X 2): Special Requests: ADEQUATE

## 2016-10-26 LAB — HEMOGLOBIN A1C
Hgb A1c MFr Bld: 6.1 % — ABNORMAL HIGH (ref 4.8–5.6)
Mean Plasma Glucose: 128 mg/dL

## 2016-10-26 LAB — COMPREHENSIVE METABOLIC PANEL
ALT: 541 U/L — ABNORMAL HIGH (ref 14–54)
AST: 245 U/L — ABNORMAL HIGH (ref 15–41)
Albumin: 2.3 g/dL — ABNORMAL LOW (ref 3.5–5.0)
Alkaline Phosphatase: 47 U/L (ref 38–126)
Anion gap: 7 (ref 5–15)
BUN: 34 mg/dL — ABNORMAL HIGH (ref 6–20)
CO2: 27 mmol/L (ref 22–32)
Calcium: 8.7 mg/dL — ABNORMAL LOW (ref 8.9–10.3)
Chloride: 104 mmol/L (ref 101–111)
Creatinine, Ser: 1.78 mg/dL — ABNORMAL HIGH (ref 0.44–1.00)
GFR calc Af Amer: 29 mL/min — ABNORMAL LOW (ref >60)
GFR calc non Af Amer: 25 mL/min — ABNORMAL LOW (ref >60)
Glucose, Bld: 183 mg/dL — ABNORMAL HIGH (ref 65–99)
Potassium: 3.8 mmol/L (ref 3.5–5.1)
Sodium: 138 mmol/L (ref 135–145)
Total Bilirubin: 0.6 mg/dL (ref 0.3–1.2)
Total Protein: 4.8 g/dL — ABNORMAL LOW (ref 6.5–8.1)

## 2016-10-26 LAB — CBC
HCT: 30.4 % — ABNORMAL LOW (ref 36.0–46.0)
Hemoglobin: 10.1 g/dL — ABNORMAL LOW (ref 12.0–15.0)
MCH: 29.8 pg (ref 26.0–34.0)
MCHC: 33.2 g/dL (ref 30.0–36.0)
MCV: 89.7 fL (ref 78.0–100.0)
Platelets: 122 10*3/uL — ABNORMAL LOW (ref 150–400)
RBC: 3.39 MIL/uL — ABNORMAL LOW (ref 3.87–5.11)
RDW: 15.3 % (ref 11.5–15.5)
WBC: 4.8 10*3/uL (ref 4.0–10.5)

## 2016-10-26 LAB — URINE CULTURE: Culture: >=100000 — AB

## 2016-10-26 LAB — GLUCOSE, CAPILLARY: Glucose-Capillary: 148 mg/dL — ABNORMAL HIGH (ref 65–99)

## 2016-10-26 MED ORDER — AMOXICILLIN 500 MG PO CAPS
500.0000 mg | ORAL_CAPSULE | Freq: Two times a day (BID) | ORAL | Status: DC
  Administered 2016-10-26: 500 mg via ORAL
  Filled 2016-10-26: qty 1

## 2016-10-26 MED ORDER — SACCHAROMYCES BOULARDII 250 MG PO CAPS: 250.0000 mg | ORAL_CAPSULE | Freq: Two times a day (BID) | ORAL | 0 refills | Status: AC

## 2016-10-26 MED ORDER — AMOXICILLIN 500 MG PO CAPS: 500.0000 mg | ORAL_CAPSULE | Freq: Two times a day (BID) | ORAL | 0 refills | Status: AC

## 2016-10-26 MED ORDER — FUROSEMIDE 40 MG PO TABS: 80.0000 mg | ORAL_TABLET | Freq: Two times a day (BID) | ORAL | 0 refills | Status: DC

## 2016-10-26 NOTE — Consult Note (Signed)
           [facility] CM Primary Care Navigator  10/26/2016  Whitney [name] 10/24/1927 [id number]   Wentto seepatient at the bedsideto identify possible discharge needs but she was just discharged per staff report.  Patient was discharged home.  Primary care provider's officeis listed as doing transition of care.  Patient has an appointment for hospital follow-up visit with primary care provider on 8/22 at 4 pm. Patient also has an appointment scheduled to follow-up with endocrinologist on 10/30/16.   For questions, please contact:  Dannielle Huh, BSN, RN- [facility] Primary Care Navigator  Telephone: [phone] [place]

## 2016-10-26 NOTE — Discharge Summary (Signed)
Discharge Summary  Whitney [name] IRJ:[id number] DOB: 1927-11-08  PCP: [name], MD  Admit date: 10/23/2016 Discharge date: 10/26/2016  Time spent: <31mns  Recommendations for Outpatient Follow-up:  1. F/u with PMD within a week  for hospital discharge follow up, repeat cbc/cmp at follow up, pmd decicde when to restart amiodarone and lipitor pending lft improvement. Patient is advised to check blood pressure at home, bring in record for your doctor to reveiw and further adjust blood pressure meds as need 2. F/u with endocrinology for diabetes control   Discharge Diagnoses:  Active Hospital Problems   Diagnosis Date Noted  . Transaminitis 10/23/2016  . Bacteremia 10/25/2016  . Sinus bradycardia 10/23/2016  . PAF (paroxysmal atrial fibrillation) ([facility] 01/30/2014  . Breast cancer of upper-outer quadrant of right female breast ([facility] 11/01/2013  . Essential hypertension, benign 12/01/2012  . DM2 (diabetes mellitus, type 2) ([facility] 11/21/2012    Resolved Hospital Problems   Diagnosis Date Noted Date Resolved  No resolved problems to display.    Discharge Condition: stable  Diet recommendation: heart healthy/carb modified  Filed Weights   10/24/16 0509  Weight: 79.8 kg (176 lb)    History of present illness:  PCP: PSusy Frizzle MD  Patient coming from: Home  I have personally briefly reviewed [place] Chief Complaint: Dizziness  HPI: Whitney VALDIVIAis a 83y.o. female with medical history significant of A.Fib on Eliquis, DM2, BRCA in remission on tamoxifen.  Patient presents to the ED with c/o dizziness and vomiting.  Symptoms onset shortly after waking up this morning.  Symptoms worse with ambulation.  Associated vomiting.  No fevers, chest pain, SOB, abd pain, dysuria.  Has just felt generally unwell.   ED Course: LFT elevations in the 200s, UA shows 6-30 WBC, no leuk esterase.  Tm 99.4.  Concerning  however, patient had very similar presentation last October with LFT elevations, etc.  Was sent home and had to be called back and admitted after her BCx came back positive for E.Coli from a UTI (positive UA that time though).  Hospital Course:  Principal Problem:   Transaminitis Active Problems:   DM[place] (di[place], type 2) ([facility]   Essential hypertension, benign   Breast cancer of upper-outer quadrant of right female breast (H[facility])   PAF (paroxysmal atrial fibrillation) (H[facility])   Sinus bradycardia   Bacteremia   Sepsis presented on admission with leukocytosis , lactic acidosis, elevated lft and cr source of infect from ecoli bacteremia/ uti: -Similar presentation as of last year when she was admitted to hospital in 12/2015 -urine and Blood culuture + pan sensitive ecoli, she received iv rocephin 2g in the hospital - she is discharged on amoxicillin for additional 10days to finish total of 14day abx treatment  Elevated lft, likely from sepsis -Ct ab in 2017 "Hepatobiliary: Scattered small hypodensities are noted within the liver, nonspecific but possibly reflecting small cysts. A large stone is noted within the gallbladder. The gallbladder is otherwise unremarkable. The common bile duct remains normal in caliber.: -lft remain elevated,   ab uKoreadid not show acute abnormalities  -hepatitis panel negative in 12/2015, will not repeat. -hold amiodarone and statin for now, -pmd to repeat cmp and decide when to resume amiodarone and statin. Avoid hepatic toxin  PAF, sinus bradycardia : -hold amiodarone due to elevated lft -reduced betablocker due to sepsis while hospitalized, she is discharged on home dose betablocker. pmd to follow up. -continue  elliquis  Chronic diastolic chf: currently euvolemic to dry lasix held since admission, she received gentle hydration, now off ivf, close monitor volume status Advise to  Restart lasix on 8/15, hold lisinopril, continue metoprolol   HTN,  presented with sepsis and elevated cr,  She is discharged on norvasc, doxazosin, metoprolol, Lisinopril held.  pmd to repeat labs and continue adjust meds   noninsulin dependent dm2;   home oral meds held in the hospital, resumed at discharge  she received ssi while in the hospital   a1c 6.1   CKDIII, cr slightly elevated from baseline Renal dosing meds, avoid hypotension Lasix and lisinopril held since admission, advise to continue hold lasix for another day, then resume Continue hold lisinopril, pmd follow up to repeat labs, further meds adjustment per pmd.  H/o breast cancer, continue tamoxifen  FTT: she did well with PT, she is discharged home with home health RN for meds management.  Code Status: full  Family Communication: patient and daughter  Disposition Plan: home    Consultants:  none  Procedures:  none  Antibiotics:  Rocephin from 8/10   Discharge Exam: BP (!) 130/55 (BP Location: Left Arm)   Pulse (!) 51   Temp 98.4 F (36.9 C) (Oral)   Resp 17   Wt 79.8 kg (176 lb)   SpO2 100%   BMI 27.98 kg/m   General: * Cardiovascular: * Respiratory: *  Discharge Instructions You were cared for by a hospitalist during your hospital stay. If you have any questions about your discharge medications or the care you received while you were in the hospital after you are discharged, you can call the unit and asked to speak with the hospitalist on call if the hospitalist that took care of you is not available. Once you are discharged, your primary care physician will handle any further medical issues. Please note that NO REFILLS for any discharge medications will be authorized once you are discharged, as it is imperative that you return to your primary care physician (or establish a relationship with a primary care physician if you do not have one) for your aftercare needs so that they can reassess your need for medications and monitor your lab  values.  Discharge Instructions    Diet - low sodium heart healthy    Complete by:  As directed    Carb modified   Discharge instructions    Complete by:  As directed    you have been taking metoprolol succinate twice a day, please verify this with your primary care doctor.   Face-to-face encounter (required for Medicare/Medicaid patients)    Complete by:  As directed    I Denver Bentson certify that this patient is under my care and that I, or a nurse practitioner or physician's assistant working with me, had a face-to-face encounter that meets the physician face-to-face encounter requirements with this patient on 10/26/2016. The encounter with the patient was in whole, or in part for the following medical condition(s) which is the primary reason for home health care (List medical condition): FTT, medication management.   The encounter with the patient was in whole, or in part, for the following medical condition, which is the primary reason for home health care:  FTT   I certify that, based on my findings, the following services are medically necessary home health services:  Nursing   Reason for Medically Necessary Home Health Services:  Skilled Nursing- Change/Decline in Patient Status   My clinical findings support the need  for the above services:  Shortness of breath with activity   Further, I certify that my clinical findings support that this patient is homebound due to:  Shortness of Breath with activity   Home Health    Complete by:  As directed    To provide the following care/treatments:  RN   Increase activity slowly    Complete by:  As directed      Allergies as of 10/26/2016      Reactions   Tape Other (See Comments)   Skin is somewhat sensitive      Medication List    STOP taking these medications   amiodarone 200 MG tablet Commonly known as:  PACERONE   atorvastatin 10 MG tablet Commonly known as:  LIPITOR   lisinopril 20 MG tablet Commonly known as:  PRINIVIL,ZESTRIL      TAKE these medications   amLODipine 5 MG tablet Commonly known as:  NORVASC TAKE 1 TABLET (5 MG TOTAL) BY MOUTH DAILY.   amoxicillin 500 MG capsule Commonly known as:  AMOXIL Take 1 capsule (500 mg total) by mouth every 12 (twelve) hours.   doxazosin 4 MG tablet Commonly known as:  CARDURA TAKE 1 TABLET BY MOUTH EVERY DAY   ELIQUIS 5 MG Tabs tablet Generic drug:  apixaban TAKE 1 TABLET TWICE A DAY What changed:  See the new instructions.   EX-LAX 15 MG Tabs Generic drug:  Sennosides Take 1 tablet by mouth daily as needed (CONSTIPATION).   furosemide 40 MG tablet Commonly known as:  LASIX Take 2 tablets (80 mg total) by mouth 2 (two) times daily. Please start to take lasix on 8/15 Start taking on:  10/28/2016 What changed:  See the new instructions.  These instructions start on 10/28/2016. If you are unsure what to do until then, ask your doctor or other care provider.   glimepiride 1 MG tablet Commonly known as:  AMARYL TAKE 1/2 TABLET BY MOUTH TWICE A DAY   KLOR-CON M20 20 MEQ tablet Generic drug:  potassium chloride SA TAKE ONE TABLET BY MOUTH ONCE DAILY   metoprolol succinate 100 MG 24 hr tablet Commonly known as:  TOPROL-XL TAKE 1 TABLET BY MOUTH TWICE A DAY WITH OR IMMEDIATELY FOLLOWING A MEAL   REFRESH OPTIVE ADVANCED OP Place 1 drop into both eyes 3 (three) times daily.   saccharomyces boulardii 250 MG capsule Commonly known as:  FLORASTOR Take 1 capsule (250 mg total) by mouth 2 (two) times daily.   sitaGLIPtin 100 MG tablet Commonly known as:  JANUVIA Take 1 tablet (100 mg total) by mouth daily.   tamoxifen 20 MG tablet Commonly known as:  NOLVADEX TAKE 1 TABLET BY MOUTH DAILY      Allergies  Allergen Reactions  . Tape Other (See Comments)    Skin is somewhat sensitive   Follow-up Information    [name], MD Follow up in 1 week(s).   Specialty:  Family Medicine Why:  hospital discharge follow up, repeat cbc/cmp at follow up.   pmd will direct you when to restart amiodarone and lipitor.  please check your blood pressure at home, bring in record for your doctor to reveiw and further adjust blood pressure meds as need Contact information: [address] [place] [address] [phone]        Elayne Snare, MD Follow up on 10/30/2016.   Specialty:  Endocrinology Why:  for diabetes control Contact information: [address] [place] [address]  [phone]        [name], MD Follow up on 11/04/2016.   Specialty:  Family Medicine Why:  Appointment is on 11/04/16 at [facility] information: [address] [place] [address] [phone]            The results of significant diagnostics from this hospitalization (including imaging, microbiology, ancillary and laboratory) are listed below for reference.    Significant Diagnostic Studies: Ct Head Wo Contrast  Result Date: 10/23/2016 CLINICAL DATA:  Slurred speech. EXAM: CT HEAD WITHOUT CONTRAST TECHNIQUE: Contiguous axial images were obtained from the base of the skull through the vertex without intravenous contrast. COMPARISON:  None. FINDINGS: Brain: No acute infarct, hemorrhage, or mass lesion is present. The ventricles are of normal size. No significant extraaxial fluid collection is present. Mild atrophy and white matter changes are within normal limits for age. Vascular: Vascular calcifications are present. There is no hyperdense vessel. Skull: The calvarium is intact. No focal lytic or blastic lesions are present. Sinuses/Orbits: The paranasal sinuses and mastoid air cells are clear. Bilateral lens replacements are present. The globes and orbits are within normal limits. IMPRESSION: 1. Normal CT of the head for age. 2. Atherosclerotic calcifications without hyperdense vessel. Electronically Signed   By: San Morelle M.D.   On: 10/23/2016 14:04   US Abdomen Complete  Result Date: 10/23/2016 CLINICAL  DATA:  Vomiting since this morning. Elevated liver function tests. History of breast cancer. EXAM: ABDOMEN ULTRASOUND COMPLETE COMPARISON:  Abdomen and pelvis CT dated 01/01/2016. FINDINGS: Gallbladder: No gallstones or wall thickening visualized. No sonographic Murphy sign noted by sonographer. The gallbladder fundus is obscured by overlying bowel gas. There was a 2.4 cm gallstone in the gallbladder on the previous CT. Common bile duct: Diameter: 4.6 mm Liver: 1.2 cm cyst. Within normal limits in parenchymal echogenicity. IVC: No abnormality visualized. Pancreas: Visualized portion unremarkable. Spleen: Size and appearance within normal limits. Right Kidney: Length: 10.8 cm. Normal echotexture. 3.2 cm and 1.6 cm upper pole cyst. No hydronephrosis. Left Kidney: Length: 8.8 cm. 2.2 cm cyst. Normal echotexture. No hydronephrosis. Abdominal aorta: No aneurysm visualized. Other findings: None. IMPRESSION: 1. The previously demonstrated 2.4 cm gallstone in the gallbladder is not visualized today. This is most likely due to obscuration of the stone by overlying bowel gas. 2. No acute abnormality. Electronically Signed   By: Claudie Revering M.D.   On: 10/23/2016 18:10    Microbiology: Recent Results (from the past 240 hour(s))  Urine culture     Status: Abnormal   Collection Time: 10/23/16  7:21 PM  Result Value Ref Range Status   Specimen Description URINE, CLEAN CATCH  Final   Special Requests NONE  Final   Culture >=100,000 COLONIES/mL ESCHERICHIA COLI (A)  Final   Report Status 10/26/2016 FINAL  Final   Organism ID, Bacteria ESCHERICHIA COLI (A)  Final      Susceptibility   Escherichia coli - MIC*    AMPICILLIN <=2 SENSITIVE Sensitive     CEFAZOLIN <=4 SENSITIVE Sensitive     CEFTRIAXONE <=1 SENSITIVE Sensitive     CIPROFLOXACIN <=0.25 SENSITIVE Sensitive     GENTAMICIN <=1 SENSITIVE Sensitive     IMIPENEM <=0.25 SENSITIVE Sensitive     NITROFURANTOIN <=16 SENSITIVE Sensitive     TRIMETH/SULFA <=20  SENSITIVE Sensitive     AMPICILLIN/SULBACTAM <=2 SENSITIVE Sensitive     PIP/TAZO <=4 SENSITIVE Sensitive     Extended ESBL NEGATIVE Sensitive     * >=  100,000 COLONIES/mL ESCHERICHIA COLI  Culture, blood (routine x 2)     Status: None (Preliminary result)   Collection Time: 10/23/16  9:50 PM  Result Value Ref Range Status   Specimen Description BLOOD RIGHT ANTECUBITAL  Final   Special Requests   Final    BOTTLES DRAWN AEROBIC AND ANAEROBIC Blood Culture adequate volume   Culture NO GROWTH 1 DAY  Final   Report Status PENDING  Incomplete  Culture, blood (routine x 2)     Status: Abnormal   Collection Time: 10/23/16 10:00 PM  Result Value Ref Range Status   Specimen Description BLOOD RIGHT WRIST  Final   Special Requests IN PEDIATRIC BOTTLE Blood Culture adequate volume  Final   Culture  Setup Time   Final    GRAM NEGATIVE RODS IN PEDIATRIC BOTTLE CRITICAL RESULT CALLED TO, READ BACK BY AND VERIFIED WITH: Venetia Night [id number] MLM    Culture ESCHERICHIA COLI (A)  Final   Report Status 10/26/2016 FINAL  Final   Organism ID, Bacteria ESCHERICHIA COLI  Final      Susceptibility   Escherichia coli - MIC*    AMPICILLIN <=2 SENSITIVE Sensitive     CEFAZOLIN <=4 SENSITIVE Sensitive     CEFEPIME <=1 SENSITIVE Sensitive     CEFTAZIDIME <=1 SENSITIVE Sensitive     CEFTRIAXONE <=1 SENSITIVE Sensitive     CIPROFLOXACIN <=0.25 SENSITIVE Sensitive     GENTAMICIN <=1 SENSITIVE Sensitive     IMIPENEM <=0.25 SENSITIVE Sensitive     TRIMETH/SULFA <=20 SENSITIVE Sensitive     AMPICILLIN/SULBACTAM <=2 SENSITIVE Sensitive     PIP/TAZO <=4 SENSITIVE Sensitive     Extended ESBL NEGATIVE Sensitive     * ESCHERICHIA COLI  Blood Culture ID Panel (Reflexed)     Status: Abnormal   Collection Time: 10/23/16 10:00 PM  Result Value Ref Range Status   Enterococcus species NOT DETECTED NOT DETECTED Final   Vancomycin resistance NOT DETECTED NOT DETECTED Final   Listeria monocytogenes NOT DETECTED  NOT DETECTED Final   Staphylococcus species NOT DETECTED NOT DETECTED Final   Staphylococcus aureus NOT DETECTED NOT DETECTED Final   Methicillin resistance NOT DETECTED NOT DETECTED Final   Streptococcus species NOT DETECTED NOT DETECTED Final   Streptococcus agalactiae NOT DETECTED NOT DETECTED Final   Streptococcus pneumoniae NOT DETECTED NOT DETECTED Final   Streptococcus pyogenes NOT DETECTED NOT DETECTED Final   Acinetobacter baumannii NOT DETECTED NOT DETECTED Final   Enterobacteriaceae species DETECTED (A) NOT DETECTED Final    Comment: CRITICAL RESULT CALLED TO, READ BACK BY AND VERIFIED WITH: PHARMD J MARKLE [id number] MLM    Enterobacter cloacae complex NOT DETECTED NOT DETECTED Final   Escherichia coli DETECTED (A) NOT DETECTED Final    Comment: CRITICAL RESULT CALLED TO, READ BACK BY AND VERIFIED WITH: PHARMD J MARKLE [id number] MLM    Klebsiella oxytoca NOT DETECTED NOT DETECTED Final   Klebsiella pneumoniae NOT DETECTED NOT DETECTED Final   Proteus species NOT DETECTED NOT DETECTED Final   Serratia marcescens NOT DETECTED NOT DETECTED Final   Carbapenem resistance NOT DETECTED NOT DETECTED Final   Haemophilus influenzae NOT DETECTED NOT DETECTED Final   Neisseria meningitidis NOT DETECTED NOT DETECTED Final   Pseudomonas aeruginosa NOT DETECTED NOT DETECTED Final   Candida albicans NOT DETECTED NOT DETECTED Final   Candida glabrata NOT DETECTED NOT DETECTED Final   Candida krusei NOT DETECTED NOT DETECTED Final   Candida parapsilosis NOT DETECTED NOT DETECTED Final  Candida tropicalis NOT DETECTED NOT DETECTED Final  MRSA PCR Screening     Status: None   Collection Time: 10/24/16  8:52 AM  Result Value Ref Range Status   MRSA by PCR NEGATIVE NEGATIVE Final    Comment:        The GeneXpert MRSA Assay (FDA approved for NASAL specimens only), is one component of a comprehensive MRSA colonization surveillance program. It is not intended to diagnose  MRSA infection nor to guide or monitor treatment for MRSA infections.      Labs: Basic Metabolic Panel:  Recent Labs Lab 10/23/16 1317 10/23/16 1338 10/24/16 0018 10/25/16 0443 10/26/16 0304  NA 136 136 136 138 138  K 3.5 3.5 3.7 3.8 3.8  CL 101 98* 99* 103 104  CO2 26  --  29 28 27   GLUCOSE 212* 210* 210* 143* 183*  BUN 24* 26* 27* 31* 34*  CREATININE 1.45* 1.40* 1.65* 1.54* 1.78*  CALCIUM 9.0  --  8.9 8.9 8.7*  MG  --   --  2.0  --   --    Liver Function Tests:  Recent Labs Lab 10/23/16 1317 10/24/16 0018 10/25/16 0443 10/26/16 0304  AST 305* 632* 520* 245*  ALT 262* 568* 689* 541*  ALKPHOS 51 45 44 47  BILITOT 1.0 0.7 0.8 0.6  PROT 6.4* 5.4* 4.9* 4.8*  ALBUMIN 3.1* 2.7* 2.4* 2.3*   No results for input(s): LIPASE, AMYLASE in the last 168 hours.  Recent Labs Lab 10/25/16 0443  AMMONIA 19   CBC:  Recent Labs Lab 10/23/16 1317 10/23/16 1338 10/24/16 0018 10/25/16 0443 10/26/16 0304  WBC 11.2*  --  8.5 5.1 4.8  NEUTROABS 10.3*  --   --  4.1  --   HGB 11.4* 11.6* 10.4* 10.4* 10.1*  HCT 34.9* 34.0* 30.9* 31.8* 30.4*  MCV 90.6  --  90.9 90.9 89.7  PLT 136*  --  125* 112* 122*   Cardiac Enzymes: No results for input(s): CKTOTAL, CKMB, CKMBINDEX, TROPONINI in the last 168 hours. BNP: BNP (last 3 results) No results for input(s): BNP in the last 8760 hours.  ProBNP (last 3 results) No results for input(s): PROBNP in the last 8760 hours.  CBG:  Recent Labs Lab 10/25/16 0829 10/25/16 1208 10/25/16 1646 10/25/16 2145 10/26/16 0816  GLUCAP 138* 157* 184* 190* 148*       Signed:  Oswaldo Cueto MD, PhD  [facility] 10/26/2016, 4:14 PM

## 2016-10-26 NOTE — Progress Notes (Signed)
Patient was discharged home by MD order; discharged instructions  review and give to patient and her daughter with care notes; IV DIC; patient will be escorted to the car by a volunteer via wheelchair.  

## 2016-10-27 MED ORDER — ONDANSETRON 4 MG PO TBDP
ORAL_TABLET | ORAL | Status: AC
  Filled 2016-10-27: qty 1

## 2016-10-29 LAB — CULTURE, BLOOD (ROUTINE X 2)
Culture: NO GROWTH
Special Requests: ADEQUATE

## 2016-10-30 ENCOUNTER — Ambulatory Visit (INDEPENDENT_AMBULATORY_CARE_PROVIDER_SITE_OTHER): Payer: Medicare Other | Admitting: Endocrinology

## 2016-10-30 ENCOUNTER — Encounter: Payer: Self-pay | Admitting: Endocrinology

## 2016-10-30 VITALS — BP 136/80 | HR 52 | Ht 66.5 in | Wt 181.0 lb

## 2016-10-30 DIAGNOSIS — K7689 Other specified diseases of liver: Secondary | ICD-10-CM

## 2016-10-30 DIAGNOSIS — E042 Nontoxic multinodular goiter: Secondary | ICD-10-CM

## 2016-10-30 DIAGNOSIS — D509 Iron deficiency anemia, unspecified: Secondary | ICD-10-CM | POA: Diagnosis not present

## 2016-10-30 DIAGNOSIS — E1165 Type 2 diabetes mellitus with hyperglycemia: Secondary | ICD-10-CM

## 2016-10-30 DIAGNOSIS — N289 Disorder of kidney and ureter, unspecified: Secondary | ICD-10-CM

## 2016-10-30 DIAGNOSIS — R945 Abnormal results of liver function studies: Secondary | ICD-10-CM

## 2016-10-30 LAB — COMPREHENSIVE METABOLIC PANEL
ALT: 170 U/L — ABNORMAL HIGH (ref 0–35)
AST: 37 U/L (ref 0–37)
Albumin: 3 g/dL — ABNORMAL LOW (ref 3.5–5.2)
Alkaline Phosphatase: 56 U/L (ref 39–117)
BUN: 39 mg/dL — ABNORMAL HIGH (ref 6–23)
CO2: 30 mEq/L (ref 19–32)
Calcium: 8.9 mg/dL (ref 8.4–10.5)
Chloride: 106 mEq/L (ref 96–112)
Creatinine, Ser: 1.35 mg/dL — ABNORMAL HIGH (ref 0.40–1.20)
GFR: 47.74 mL/min — ABNORMAL LOW (ref >60.00)
Glucose, Bld: 162 mg/dL — ABNORMAL HIGH (ref 70–99)
Potassium: 4.3 mEq/L (ref 3.5–5.1)
Sodium: 141 mEq/L (ref 135–145)
Total Bilirubin: 0.7 mg/dL (ref 0.2–1.2)
Total Protein: 5.6 g/dL — ABNORMAL LOW (ref 6.0–8.3)

## 2016-10-30 LAB — CBC WITH DIFFERENTIAL/PLATELET
Basophils Absolute: 0 10*3/uL (ref 0.0–0.1)
Basophils Relative: 0.4 % (ref 0.0–3.0)
Eosinophils Absolute: 0 10*3/uL (ref 0.0–0.7)
Eosinophils Relative: 0.4 % (ref 0.0–5.0)
HCT: 34.7 % — ABNORMAL LOW (ref 36.0–46.0)
Hemoglobin: 11.4 g/dL — ABNORMAL LOW (ref 12.0–15.0)
Lymphocytes Relative: 8.8 % — ABNORMAL LOW (ref 12.0–46.0)
Lymphs Abs: 0.6 10*3/uL — ABNORMAL LOW (ref 0.7–4.0)
MCHC: 32.8 g/dL (ref 30.0–36.0)
MCV: 93.3 fl (ref 78.0–100.0)
Monocytes Absolute: 0.5 10*3/uL (ref 0.1–1.0)
Monocytes Relative: 7.3 % (ref 3.0–12.0)
Neutro Abs: 5.5 10*3/uL (ref 1.4–7.7)
Neutrophils Relative %: 83.1 % — ABNORMAL HIGH (ref 43.0–77.0)
Platelets: 164 10*3/uL (ref 150.0–400.0)
RBC: 3.72 Mil/uL — ABNORMAL LOW (ref 3.87–5.11)
RDW: 15.6 % — ABNORMAL HIGH (ref 11.5–15.5)
WBC: 6.6 10*3/uL (ref 4.0–10.5)

## 2016-10-30 LAB — IBC PANEL
Iron: 84 ug/dL (ref 42–145)
Saturation Ratios: 35.7 % (ref 20.0–50.0)
Transferrin: 168 mg/dL — ABNORMAL LOW (ref 212.0–360.0)

## 2016-10-30 LAB — T4, FREE: Free T4: 1.5 ng/dL (ref 0.60–1.60)

## 2016-10-30 LAB — TSH: TSH: 0.25 u[IU]/mL — ABNORMAL LOW (ref 0.35–4.50)

## 2016-10-30 NOTE — Patient Instructions (Signed)
Check blood sugars on waking up  2/7  Also check blood sugars about 2 hours after a meal and do this after different meals by rotation  Recommended blood sugar levels on waking up is 90-130 and about 2 hours after meal is 130-160  Please bring your blood sugar monitor to each visit, thank you  

## 2016-10-30 NOTE — Progress Notes (Signed)
Patient ID: Whitney [name], female   DOB: 05-Nov-1927, 83 y.o.   MRN: [id number]   Reason for Appointment: Diabetes and other problems  History of Present Illness     Type 2 DIABETES MELITUS, date of diagnosis:  1983     Previous history: She had been taking metformin and Actos for several years.  She usually has had excellent control with upper normal A1c  Recent history:   Oral hypoglycemic drugs: Januvia 50 mg daily    She was previously on  Metformin and Actos; because of edema she was told to stop Actos Her metformin was stopped because of renal dysfunction and Amaryl because of hypoglycemia Now she is on Januvia 50 mg alone  Current blood sugar patterns, management details:  As usual she checks blood sugars 2-3 hours after her evening meal and not any other time usually  Blood sugar range in the evening 107-216  However she was having high blood sugars before and after her hospitalization earlier this month  She is tending to have readings of 180 after evening meal but not consistently  Fasting blood sugars available since her hospitalization  She is not able to do much physical activity  Overall her diet is fairly good  Her weight has gone down to the recent illness  Her A1c usually lower than expected for her blood sugars, most recent 6.2 %        Monitors blood glucose:   less than once a day .    Glucometer: One Touch.           Overall MEDIAN blood sugar 168 for the last month  Physical activity: exercise: Minimal  Diet: She Is avoiding fried and fast food, sometimes will have some light lemonade                Wt Readings from Last 3 Encounters:  10/30/16 181 lb (82.1 kg)  10/24/16 176 lb (79.8 kg)  10/15/16 189 lb (85.7 kg)    LABS:  Lab Results  Component Value Date   HGBA1C 6.1 (H) 10/25/2016   HGBA1C 6.3 06/24/2016   HGBA1C 6.2 03/26/2016   Lab Results  Component Value Date   MICROALBUR 6.7 (H) 09/16/2015   LDLCALC 85  09/16/2015   CREATININE 1.78 (H) 10/26/2016     OTHER problems  are discussed in review of systems      Allergies as of 10/30/2016      Reactions   Tape Other (See Comments)   Skin is somewhat sensitive      Medication List       Accurate as of 10/30/16 10:54 AM. Always use your most recent med list.          amLODipine 5 MG tablet Commonly known as:  NORVASC TAKE 1 TABLET (5 MG TOTAL) BY MOUTH DAILY.   amoxicillin 500 MG capsule Commonly known as:  AMOXIL Take 1 capsule (500 mg total) by mouth every 12 (twelve) hours.   doxazosin 4 MG tablet Commonly known as:  CARDURA TAKE 1 TABLET BY MOUTH EVERY DAY   ELIQUIS 5 MG Tabs tablet Generic drug:  apixaban TAKE 1 TABLET TWICE A DAY   EX-LAX 15 MG Tabs Generic drug:  Sennosides Take 1 tablet by mouth daily as needed (CONSTIPATION).   furosemide 40 MG tablet Commonly known as:  LASIX Take 2 tablets (80 mg total) by mouth 2 (two) times daily. Please start to take lasix on 8/15   glimepiride 1 MG  tablet Commonly known as:  AMARYL TAKE 1/2 TABLET BY MOUTH TWICE A DAY   KLOR-CON M20 20 MEQ tablet Generic drug:  potassium chloride SA TAKE ONE TABLET BY MOUTH ONCE DAILY   metoprolol succinate 100 MG 24 hr tablet Commonly known as:  TOPROL-XL TAKE 1 TABLET BY MOUTH TWICE A DAY WITH OR IMMEDIATELY FOLLOWING A MEAL   REFRESH OPTIVE ADVANCED OP Place 1 drop into both eyes 3 (three) times daily.   saccharomyces boulardii 250 MG capsule Commonly known as:  FLORASTOR Take 1 capsule (250 mg total) by mouth 2 (two) times daily.   sitaGLIPtin 100 MG tablet Commonly known as:  JANUVIA Take 1 tablet (100 mg total) by mouth daily.   tamoxifen 20 MG tablet Commonly known as:  NOLVADEX TAKE 1 TABLET BY MOUTH DAILY       Allergies:  Allergies  Allergen Reactions  . Tape Other (See Comments)    Skin is somewhat sensitive    Past Medical History:  Diagnosis Date  . Anemia   . Atrial fibrillation ([place])   .  Breast cancer ([place]) 10/2013   right upper outer  . Cancer ([place])    right breast  . Complication of anesthesia    slow to wake up  . Diabetes mellitus without complication ([place])   . Dysrhythmia 10/15   AF  . Former smoker   . Full dentures   . Hyperlipidemia   . Hypertension   . Multinodular goiter   . Radiation    Right Breast  . Thyroid disease    hypothyroidism  . Wears glasses     Past Surgical History:  Procedure Laterality Date  . ABDOMINAL HYSTERECTOMY    . AXILLARY LYMPH NODE DISSECTION Right 01/09/2014   Procedure: RIGHT AXILLARY LYMPH NODE DISECTION;  Surgeon: [name], MD;  Location: [facility];  Service: General;  Laterality: Right;  . BREAST LUMPECTOMY WITH RADIOACTIVE SEED LOCALIZATION Right 01/09/2014   Procedure: RIGHT BREAST SEED LOCALIZED LUMPECTOMY ;  Surgeon: [name], MD;  Location: [facility];  Service: General;  Laterality: Right;  . EYE SURGERY  12/22/2009   cataracts  . INTRAMEDULLARY (IM) NAIL INTERTROCHANTERIC Left 02/24/2014   Procedure: IM ROD LEFT HIP FX;  Surgeon: Alta Corning, MD;  Location: [facility];  Service: Orthopedics;  Laterality: Left;  . PORT-A-CATH REMOVAL Right 01/09/2014   Procedure: REMOVAL PORT-A-CATH;  Surgeon: [name], MD;  Location: [facility];  Service: General;  Laterality: Right;  . PORTACATH PLACEMENT N/A 11/15/2013   Procedure: INSERTION PORT-A-CATH WITH ULTRA SOUND AND FLOROSCOPY;  Surgeon: [name], MD;  Location: [facility];  Service: General;  Laterality: N/A;  . TOTAL KNEE ARTHROPLASTY  2002   left    No family history on file.  Social History:  reports that she quit smoking about 58 years ago. She has a 5.00 pack-year smoking history. She has never used smokeless tobacco. She reports that she does not drink alcohol or use drugs.  Review of Systems:   Hypertension:  She is still taking amlodipine 5 mg daily, 4 mg doxazosin, 100 mg of metoprolol    Recently lisinopril was stopped in the hospitalization in August  Does not monitor BP at home  EDEMA She is taking Lasix 160 mg daily for edema, prescribed by PCP Also she is wearing elastic stockings Edema is relatively well-controlled   Renal function: Creatinine has been variable, now significantly high with her hospitalization   Lab Results  Component Value Date   CREATININE 1.78 (H) 10/26/2016   BUN 34 (H) 10/26/2016   NA 138 10/26/2016   K 3.8 10/26/2016   CL 104 10/26/2016   CO2 27 10/26/2016    Lipids: Has been on long-term Lipitor with good control    Lab Results  Component Value Date   CHOL 162 09/16/2015   HDL 66.80 09/16/2015   LDLCALC 85 09/16/2015   TRIG 48.0 09/16/2015   CHOLHDL 2 09/16/2015    She has had vitamin D deficiency, Treated with 2000 units vitamin D 3  HYPOTHYROIDISM: She has had a multinodular goiter She says she has occasional difficulty swallowing  Levothyroxine supplements were stopped in 5/16 and she has continued to be euthyroid  Recently amiodarone has been stopped also   Lab Results  Component Value Date   TSH 0.53 06/24/2016   TSH 0.54 03/26/2016   TSH 0.70 09/16/2015   FREET4 1.27 06/24/2016   FREET4 1.26 03/19/2015   FREET4 1.42 11/08/2013    Last foot exam in 7/17   ADRENAL mass:  She had a CT scan done during her hospitalization and again was found to have a left adrenal mass  She was evaluated previously by a PET scan after  diagnosis of breast cancer in 2015 She was found to have a 3.7 cm hypermetabolic left adrenal mass On a previous CT scan in 2011 she also had a 3.8 cm adrenal mass Cortisol level is normal in 2015 and 2016 No history of hypokalemia or severe hypertension/palpitations, headaches, flushing or significant fluctuations in blood pressure  More recent evaluation with metanephrines and overnight dexamethasone test indicates normal adrenal function   Examination:   BP 136/80   Pulse (!) 52    Ht 5' 6.5" (1.689 m)   Wt 181 lb (82.1 kg)   SpO2 97%   BMI 28.78 kg/m   Body mass index is 28.78 kg/m.   Thyroid is enlarged about 2 times on the right, nodular and about 1.5-2 times normal on the left, firm 1+ ankle edema present  ASSESSMENT/ PLAN:  RENAL dysfunction, abnormal liver functions and anemia: She needs follow-up of her recent significant metabolic abnormalities in the hospital when she had UTI/bacteremia and multiorgan problems Will also check iron level because of her relatively low hemoglobin  Will get her labs done and forwarded to PCP  HYPERTENSION: Blood pressure is well controlled now She recently off lisinopril possibly because of renal dysfunction and is due to follow-up with PCP  EDEMA: Improved with large doses of Lasix and will continue  Diabetes type 2   See history of present illness for  discussion of current diabetes management, blood sugar patterns and problems identified  She has reasonable glucose control with Januvia 50 mg daily A1c may be lower than expected with her renal dysfunction and anemia Post prandial readings are averaging about 160 again after supper but recently higher This is still  adequate for her age She will continue the same regimen especially since she had tendency to hypoglycemia with sulfonylurea drugs   GOITER: Clinically the same, most likely her dysphagia which is mild is not related Will need follow-up of TSH periodically   Siara Gorder 10/30/2016, 10:54 AM    Note: This office note was prepared with Dragon voice recognition system technology. Any transcriptional errors that result from this process are unintentional.   Total visit time for evaluation and management of multiple problems, review of hospitalization records and labs = 25 minutes  ADDENDUM: Her  TSH is low normal with slightly higher free T4, will follow-up again on the next visit including T3 level Other labs are significantly better

## 2016-11-02 ENCOUNTER — Ambulatory Visit
Admission: RE | Admit: 2016-11-02 | Discharge: 2016-11-02 | Disposition: A | Discharge status: Home / Self Care | Payer: Medicare Other | Source: Ambulatory Visit | Attending: Hematology and Oncology | Admitting: Hematology and Oncology

## 2016-11-02 DIAGNOSIS — R928 Other abnormal and inconclusive findings on diagnostic imaging of breast: Secondary | ICD-10-CM | POA: Diagnosis not present

## 2016-11-02 DIAGNOSIS — Z853 Personal history of malignant neoplasm of breast: Secondary | ICD-10-CM

## 2016-11-04 ENCOUNTER — Ambulatory Visit (INDEPENDENT_AMBULATORY_CARE_PROVIDER_SITE_OTHER): Payer: Medicare Other | Admitting: Family Medicine

## 2016-11-04 ENCOUNTER — Encounter: Payer: Self-pay | Admitting: Family Medicine

## 2016-11-04 VITALS — BP 112/74 | HR 60 | Temp 98.3°F | Resp 16 | Ht 66.5 in | Wt 181.0 lb

## 2016-11-04 DIAGNOSIS — R7989 Other specified abnormal findings of blood chemistry: Secondary | ICD-10-CM

## 2016-11-04 DIAGNOSIS — Z09 Encounter for follow-up examination after completed treatment for conditions other than malignant neoplasm: Secondary | ICD-10-CM

## 2016-11-04 DIAGNOSIS — N179 Acute kidney failure, unspecified: Secondary | ICD-10-CM | POA: Diagnosis not present

## 2016-11-04 DIAGNOSIS — N183 Chronic kidney disease, stage 3 (moderate): Secondary | ICD-10-CM

## 2016-11-04 DIAGNOSIS — R945 Abnormal results of liver function studies: Secondary | ICD-10-CM

## 2016-11-04 DIAGNOSIS — A4151 Sepsis due to Escherichia coli [E. coli]: Secondary | ICD-10-CM | POA: Diagnosis not present

## 2016-11-04 NOTE — Progress Notes (Signed)
Subjective:    Patient ID: Whitney [name], female    DOB: Feb 19, 1928, 83 y.o.   MRN: [id number]  HPI Patient was recently admitted to the hospital for urosepsis. I have copied relevant portions of the discharge summary and included them below for my reference:  Admit date: 10/23/2016 Discharge date: 10/26/2016  Time spent: <49mns  Recommendations for Outpatient Follow-up:  1. F/u with PMD within a week  for hospital discharge follow up, repeat cbc/cmp at follow up, pmd decicde when to restart amiodarone and lipitor pending lft improvement. Patient is advised to check blood pressure at home, bring in record for your doctor to reveiw and further adjust blood pressure meds as need 2. F/u with endocrinology for diabetes control   Discharge Diagnoses:       Active Hospital Problems   Diagnosis Date Noted  . Transaminitis 10/23/2016  . Bacteremia 10/25/2016  . Sinus bradycardia 10/23/2016  . [facility]) ([facility] 01/30/2014  . Breast cancer of upper-outer quadrant of right female breast [place] 11/01/2013  . Essential hypertension, benign 12/01/2012  . DM2 (diabetes mellitus, type 2) ([facility] 11/21/2012    Resolved Hospital Problems   Diagnosis Date Noted Date Resolved  No resolved problems to display.    Discharge Condition: stable  Diet recommendation: heart healthy/carb modified     Filed Weights   10/24/16 0509  Weight: 79.8 kg (176 lb)    History of present illness:  PCP: PSusy Frizzle MD  Patient coming from: Home  I have personally briefly [place] [facility] [place] [facility] Chief Complaint: Dizziness  HPI: Whitney [name] a 83y.o.femalewith medical history significant of A.Fib on Eliquis, DM2, BRCA in remission on tamoxifen. Patient presents to the ED with c/o dizziness and vomiting. Symptoms onset shortly after waking up this morning. Symptoms worse with ambulation. Associated  vomiting. No fevers, chest pain, SOB, abd pain, dysuria. Has just felt generally unwell.   ED Course:LFT elevations in the 200s, UA shows 6-30 WBC, no leuk esterase. Tm 99.4.  Concerning however, patient had very similar presentation last October with LFT elevations, etc. Was sent home and had to be called back and admitted after her BCx came back positive for E.Coli from a UTI (positive UA that time though).  Hospital Course:  Principal Problem:   Transaminitis Active Problems:   DM2 (diabetes mellitus, type 2) ([facility]   Essential hypertension, benign   Breast cancer of upper-outer quadrant of right female breast ([facility])   PAF (paroxysmal atrial fibrillation) ([facility])   Sinus bradycardia   Bacteremia   Sepsis presented on admission with leukocytosis , lactic acidosis,elevated lft and cr source of infect from ecoli bacteremia/ uti: -Similar presentation as of last year when she was admitted to hospital in 12/2015 -urine and Blood culuture + pan sensitive ecoli, she received iv rocephin 2g in the hospital - she is discharged on amoxicillin for additional 10days to finish total of 14day abx treatment  Elevated lft, likely from sepsis -Ct ab in 2017 "Hepatobiliary: Scattered small hypodensities are noted within the liver, nonspecific but possibly reflecting small cysts. A large stone is noted within the gallbladder. The gallbladder is otherwise unremarkable. The common bile duct remains normal in caliber.: -lft remainelevated, ab uKoreadid not show acute abnormalities  -hepatitis panel negative in 12/2015, will not repeat. -hold amiodarone and statin for now, -pmd to repeat cmp and decide when to resume amiodarone and statin. Avoid hepatic toxin  PAF, sinus bradycardia: -  hold amiodarone due to elevated lft -reduced betablocker due to sepsis while hospitalized, she is discharged on home dose betablocker. pmd to follow up. -continue elliquis  Chronic diastolic chf: currently  euvolemic to dry lasix held since admission, she received gentle hydration, now off ivf,close monitor volume status Advise to  Restart lasix on 8/15, hold lisinopril, continue metoprolol   HTN,presented with sepsis and elevated cr,  She is discharged on norvasc, doxazosin, metoprolol, Lisinopril held.  pmd to repeat labs and continue adjust meds   noninsulin dependent dm2;   home oral meds held in the hospital, resumed at discharge  she received ssi while in the hospital  a1c 6.1   CKDIII,cr slightly elevated from baseline Renal dosing meds, avoid hypotension Lasix and lisinopril held since admission, advise to continue hold lasix for another day, then resume Continue hold lisinopril, pmd follow up to repeat labs, further meds adjustment per pmd.  H/o breast cancer,continue tamoxifen  FTT: she did well with PT, she is discharged home with home health RN for meds management.  Code Status:full  Family Communication:patient and daughter  Disposition Plan:home    Consultants:  none  Procedures:  none  Antibiotics:  Rocephin from 8/10  11/04/16 Patient is here today for follow-up. She feels much better. She is still taking her antibiotics although she is almost completed the. She denies any fever. Her fatigue has improved. Her blood pressure today is normal though slightly on the low side for this patient. She has some mild trace edema in both legs. She is currently on Lasix 80 mg twice a day. She denies any shortness of breath or chest pain or chest congestion. Lungs are clear to auscultation bilaterally. Amiodarone and Lipitor were held due to acute elevations in her liver function tests. Labs drawn on August 17 showed marked improvement although liver function tests were still slightly elevated. Lisinopril was held due to acute renal insufficiency in the setting of chronic kidney disease.  Most recent creatinine drawn on August 17 was back to the  patient's baseline at 1.3 although lisinopril is still being held due to low blood pressure. Patient's culture revealed pansensitive Escherichia coli. She was discharged home on amoxicillin. Past Medical History:  Diagnosis Date  . Anemia   . Atrial fibrillation ([place])   . Breast cancer ([place]) 10/2013   right upper outer  . Cancer ([place])    right breast  . Complication of anesthesia    slow to wake up  . Diabetes mellitus without complication ([place])   . Dysrhythmia 10/15   AF  . Former smoker   . Full dentures   . Hyperlipidemia   . Hypertension   . Multinodular goiter   . Radiation    Right Breast  . Thyroid disease    hypothyroidism  . Wears glasses    Past Surgical History:  Procedure Laterality Date  . ABDOMINAL HYSTERECTOMY    . AXILLARY LYMPH NODE DISSECTION Right 01/09/2014   Procedure: RIGHT AXILLARY LYMPH NODE DISECTION;  Surgeon: [name], MD;  Location: [facility];  Service: General;  Laterality: Right;  . BREAST LUMPECTOMY WITH RADIOACTIVE SEED LOCALIZATION Right 01/09/2014   Procedure: RIGHT BREAST SEED LOCALIZED LUMPECTOMY ;  Surgeon: [name], MD;  Location: [facility];  Service: General;  Laterality: Right;  . EYE SURGERY  12/22/2009   cataracts  . INTRAMEDULLARY (IM) NAIL INTERTROCHANTERIC Left 02/24/2014   Procedure: IM ROD LEFT HIP FX;  Surgeon: Alta Corning, MD;  [facility];  Service: Orthopedics;  Laterality: Left;  . PORT-A-CATH REMOVAL Right 01/09/2014   Procedure: REMOVAL PORT-A-CATH;  Surgeon: Harriette Bouillon, MD;  Location: [facility];  Service: General;  Laterality: Right;  . PORTACATH PLACEMENT N/A 11/15/2013   Procedure: INSERTION PORT-A-CATH WITH ULTRA SOUND AND FLOROSCOPY;  Surgeon: Harriette Bouillon, MD;  [facility];  Service: General;  Laterality: N/A;  . TOTAL KNEE ARTHROPLASTY  2002   left   Current Outpatient Prescriptions on File Prior to Visit  Medication Sig Dispense Refill  .  amLODipine (NORVASC) 5 MG tablet TAKE 1 TABLET (5 MG TOTAL) BY MOUTH DAILY. 30 tablet 3  . amoxicillin (AMOXIL) 500 MG capsule Take 1 capsule (500 mg total) by mouth every 12 (twelve) hours. 20 capsule 0  . Carboxymeth-Glycerin-Polysorb (REFRESH OPTIVE ADVANCED OP) Place 1 drop into both eyes 3 (three) times daily.    Marland Kitchen doxazosin (CARDURA) 4 MG tablet TAKE 1 TABLET BY MOUTH EVERY DAY 90 tablet 2  . ELIQUIS 5 MG TABS tablet TAKE 1 TABLET TWICE A DAY 60 tablet 11  . furosemide (LASIX) 40 MG tablet Take 2 tablets (80 mg total) by mouth 2 (two) times daily. Please start to take lasix on 8/15 60 tablet 0  . KLOR-CON M20 20 MEQ tablet TAKE ONE TABLET BY MOUTH ONCE DAILY 30 tablet 3  . metoprolol succinate (TOPROL-XL) 100 MG 24 hr tablet TAKE 1 TABLET BY MOUTH TWICE A DAY WITH OR IMMEDIATELY FOLLOWING A MEAL 180 tablet 1  . saccharomyces boulardii (FLORASTOR) 250 MG capsule Take 1 capsule (250 mg total) by mouth 2 (two) times daily. 20 capsule 0  . Sennosides (EX-LAX) 15 MG TABS Take 1 tablet by mouth daily as needed (CONSTIPATION).    Marland Kitchen sitaGLIPtin (JANUVIA) 100 MG tablet Take 1 tablet (100 mg total) by mouth daily. 90 tablet 1  . tamoxifen (NOLVADEX) 20 MG tablet TAKE 1 TABLET BY MOUTH DAILY 90 tablet 3   No current facility-administered medications on file prior to visit.    Allergies  Allergen Reactions  . Tape Other (See Comments)    Skin is somewhat sensitive   Social History   Social History  . Marital status: Widowed    Spouse name: N/A  . Number of children: 4  . Years of education: N/A   Occupational History  . Not on file.   Social History Main Topics  . Smoking status: Former Smoker    Packs/day: 1.00    Years: 5.00    Quit date: 11/14/1958  . Smokeless tobacco: Never Used  . Alcohol use No  . Drug use: No  . Sexual activity: No     Comment: menarche age 49, P4, first birth age 82, no HRT, menopause age 63   Other Topics Concern  . Not on file   Social History  Narrative  . No narrative on file    eReview of Systems  All other systems reviewed and are negative.      Objective:   Physical Exam  Constitutional: She is oriented to person, place, and time. She appears well-developed and well-nourished. No distress.  Neck: Neck supple. No JVD present.  Cardiovascular: Normal rate, regular rhythm, normal heart sounds and intact distal pulses.   No murmur heard. Pulmonary/Chest: Effort normal and breath sounds normal. No respiratory distress. She has no wheezes. She has no rales.  Abdominal: Soft. Bowel sounds are normal. She exhibits no distension. There is no tenderness. There is no rebound and no  guarding.  Musculoskeletal: She exhibits edema.  Lymphadenopathy:    She has no cervical adenopathy.  Neurological: She is alert and oriented to person, place, and time. No cranial nerve deficit. She exhibits normal muscle tone. Coordination normal.  Skin: No rash noted. She is not diaphoretic.  Psychiatric: She has a normal mood and affect. Her behavior is normal. Judgment and thought content normal.  Vitals reviewed.         Assessment & Plan:  Hospital discharge follow-up - Plan: CBC with Differential/Platelet, COMPLETE METABOLIC PANEL WITH GFR, Culture, blood (single) w Reflex to ID Panel, Urine Culture  Sepsis due to Escherichia coli (HCC) - Plan: CBC with Differential/Platelet, COMPLETE METABOLIC PANEL WITH GFR, Culture, blood (single) w Reflex to ID Panel, Urine Culture  Elevated liver function tests  Acute renal failure superimposed on stage 3 chronic kidney disease, unspecified acute renal failure type (HCC)  Clinically the patient is doing much better. I will repeat a urine culture to ensure resolution of her urinary tract infection along with a blood culture ensure resolution of the Escherichia coli bacteremia. Repeat a CBC today along with a CMP. Once liver function tests have improved, I would restart the patient's Lipitor and  amiodarone. I would continue to hold lisinopril at the present time as her blood pressure is soft. I anticipate likely resuming the lisinopril in one week after the patient has had adequate time to recover Wt Readings from Last 3 Encounters:  11/04/16 181 lb (82.1 kg)  10/30/16 181 lb (82.1 kg)  10/24/16 176 lb (79.8 kg)   Based on her weight, I believe that she is now euvolemic

## 2016-11-05 ENCOUNTER — Telehealth: Payer: Self-pay | Admitting: Family Medicine

## 2016-11-05 LAB — CBC WITH DIFFERENTIAL/PLATELET
Basophils Absolute: 0 cells/uL (ref 0–200)
Basophils Relative: 0 %
Eosinophils Absolute: 68 cells/uL (ref 15–500)
Eosinophils Relative: 1 %
HCT: 33.8 % — ABNORMAL LOW (ref 35.0–45.0)
Hemoglobin: 11.1 g/dL — ABNORMAL LOW (ref 12.0–15.0)
Lymphocytes Relative: 9 %
Lymphs Abs: 612 cells/uL — ABNORMAL LOW (ref 850–3900)
MCH: 30.6 pg (ref 27.0–33.0)
MCHC: 32.8 g/dL (ref 32.0–36.0)
MCV: 93.1 fL (ref 80.0–100.0)
MPV: 10.6 fL (ref 7.5–12.5)
Monocytes Absolute: 544 cells/uL (ref 200–950)
Monocytes Relative: 8 %
Neutro Abs: 5576 cells/uL (ref 1500–7800)
Neutrophils Relative %: 82 %
Platelets: 190 10*3/uL (ref 140–400)
RBC: 3.63 MIL/uL — ABNORMAL LOW (ref 3.80–5.10)
RDW: 15.5 % — ABNORMAL HIGH (ref 11.0–15.0)
WBC: 6.8 10*3/uL (ref 3.8–10.8)

## 2016-11-05 LAB — COMPLETE METABOLIC PANEL WITH GFR
ALT: 53 U/L — ABNORMAL HIGH (ref 6–29)
AST: 18 U/L (ref 10–35)
Albumin: 3.4 g/dL — ABNORMAL LOW (ref 3.6–5.1)
Alkaline Phosphatase: 56 U/L (ref 33–130)
BUN: 33 mg/dL — ABNORMAL HIGH (ref 7–25)
CO2: 29 mmol/L (ref 20–32)
Calcium: 8.9 mg/dL (ref 8.6–10.4)
Chloride: 105 mmol/L (ref 98–110)
Creat: 1.46 mg/dL — ABNORMAL HIGH (ref 0.60–0.88)
GFR, Est African American: 37 mL/min — ABNORMAL LOW (ref >=60)
GFR, Est Non African American: 32 mL/min — ABNORMAL LOW (ref >=60)
Glucose, Bld: 170 mg/dL — ABNORMAL HIGH (ref 70–99)
Potassium: 4.1 mmol/L (ref 3.5–5.3)
Sodium: 141 mmol/L (ref 135–146)
Total Bilirubin: 0.6 mg/dL (ref 0.2–1.2)
Total Protein: 5.9 g/dL — ABNORMAL LOW (ref 6.1–8.1)

## 2016-11-05 LAB — URINE CULTURE

## 2016-11-05 NOTE — Telephone Encounter (Signed)
Form is ready to be picked up - tried to call no answer and no vm

## 2016-11-05 NOTE — Telephone Encounter (Signed)
Pt came in and filled out handicap placard. Placed in yellow folder.

## 2016-11-06 ENCOUNTER — Other Ambulatory Visit: Payer: Self-pay | Admitting: Family Medicine

## 2016-11-06 DIAGNOSIS — R7989 Other specified abnormal findings of blood chemistry: Secondary | ICD-10-CM

## 2016-11-06 DIAGNOSIS — R945 Abnormal results of liver function studies: Principal | ICD-10-CM

## 2016-11-06 MED ORDER — LISINOPRIL 20 MG PO TABS: 20.0000 mg | ORAL_TABLET | Freq: Every day | ORAL | 3 refills | Status: DC

## 2016-11-06 NOTE — Telephone Encounter (Signed)
Pt aware to p/u 

## 2016-11-07 ENCOUNTER — Other Ambulatory Visit: Payer: Self-pay | Admitting: Family Medicine

## 2016-11-11 LAB — CULTURE, BLOOD (SINGLE): Organism ID, Bacteria: NO GROWTH

## 2016-11-13 ENCOUNTER — Other Ambulatory Visit: Payer: Medicare Other

## 2016-11-13 DIAGNOSIS — R7989 Other specified abnormal findings of blood chemistry: Secondary | ICD-10-CM

## 2016-11-13 DIAGNOSIS — R945 Abnormal results of liver function studies: Principal | ICD-10-CM

## 2016-11-14 LAB — COMPREHENSIVE METABOLIC PANEL
ALT: 16 U/L (ref 6–29)
AST: 15 U/L (ref 10–35)
Albumin: 3.3 g/dL — ABNORMAL LOW (ref 3.6–5.1)
Alkaline Phosphatase: 46 U/L (ref 33–130)
BUN: 34 mg/dL — ABNORMAL HIGH (ref 7–25)
CO2: 24 mmol/L (ref 20–32)
Calcium: 8.5 mg/dL — ABNORMAL LOW (ref 8.6–10.4)
Chloride: 107 mmol/L (ref 98–110)
Creat: 1.64 mg/dL — ABNORMAL HIGH (ref 0.60–0.88)
Glucose, Bld: 158 mg/dL — ABNORMAL HIGH (ref 70–99)
Potassium: 3.7 mmol/L (ref 3.5–5.3)
Sodium: 143 mmol/L (ref 135–146)
Total Bilirubin: 0.6 mg/dL (ref 0.2–1.2)
Total Protein: 5.5 g/dL — ABNORMAL LOW (ref 6.1–8.1)

## 2016-11-20 ENCOUNTER — Other Ambulatory Visit: Payer: Self-pay | Admitting: Endocrinology

## 2016-12-03 ENCOUNTER — Other Ambulatory Visit: Payer: Self-pay | Admitting: Endocrinology

## 2016-12-03 ENCOUNTER — Encounter: Payer: Self-pay | Admitting: Family Medicine

## 2016-12-03 ENCOUNTER — Ambulatory Visit (INDEPENDENT_AMBULATORY_CARE_PROVIDER_SITE_OTHER): Payer: Medicare Other | Admitting: Family Medicine

## 2016-12-03 VITALS — BP 114/62 | HR 54 | Temp 98.0°F | Resp 16 | Wt 187.0 lb

## 2016-12-03 DIAGNOSIS — M7989 Other specified soft tissue disorders: Secondary | ICD-10-CM | POA: Diagnosis not present

## 2016-12-03 LAB — BASIC METABOLIC PANEL WITH GFR
BUN/Creatinine Ratio: 23 (calc) — ABNORMAL HIGH (ref 6–22)
BUN: 35 mg/dL — ABNORMAL HIGH (ref 7–25)
CO2: 28 mmol/L (ref 20–32)
Calcium: 8.4 mg/dL — ABNORMAL LOW (ref 8.6–10.4)
Chloride: 107 mmol/L (ref 98–110)
Creat: 1.52 mg/dL — ABNORMAL HIGH (ref 0.60–0.88)
GFR, Est African American: 36 mL/min/{1.73_m2} — ABNORMAL LOW (ref >=60)
GFR, Est Non African American: 31 mL/min/{1.73_m2} — ABNORMAL LOW (ref >=60)
Glucose, Bld: 173 mg/dL — ABNORMAL HIGH (ref 65–99)
Potassium: 3.5 mmol/L (ref 3.5–5.3)
Sodium: 141 mmol/L (ref 135–146)

## 2016-12-03 MED ORDER — METOLAZONE 5 MG PO TABS: 5.0000 mg | ORAL_TABLET | Freq: Every day | ORAL | 0 refills | Status: DC

## 2016-12-03 NOTE — Progress Notes (Signed)
Subjective:    Patient ID: Whitney [name], female    DOB: Feb 19, 1928, 83 y.o.   MRN: [id number]  HPI Patient was recently admitted to the hospital for urosepsis. I have copied relevant portions of the discharge summary and included them below for my reference:  Admit date: 10/23/2016 Discharge date: 10/26/2016  Time spent: <49mns  Recommendations for Outpatient Follow-up:  1. F/u with PMD within a week  for hospital discharge follow up, repeat cbc/cmp at follow up, pmd decicde when to restart amiodarone and lipitor pending lft improvement. Patient is advised to check blood pressure at home, bring in record for your doctor to reveiw and further adjust blood pressure meds as need 2. F/u with endocrinology for diabetes control   Discharge Diagnoses:       Active Hospital Problems   Diagnosis Date Noted  . Transaminitis 10/23/2016  . Bacteremia 10/25/2016  . Sinus bradycardia 10/23/2016  . [facility] 01/[facility]2014  . [facility] [place] of upper-outer quadrant of right [place] breast [place] 11/[place]/2013  . Essential hypertension, benign 12/01/2012  . DM2 (diabetes mellitus, type 2) ([facility] 11/21/2012    Resolved Hospital Problems   Diagnosis Date Noted Date Resolved  No resolved problems to display.    Discharge Condition: stable  Diet recommendation: heart healthy/carb modified     Filed Weights   10/24/16 0509  Weight: 79.8 kg (176 lb)    History of present illness:  PCP: PSusy Frizzle MD  Patient coming from: Home  I have personally briefly reviewed [place] Chief Complaint: Dizziness  HPI: Whitney BiekerMooreis a 83y.o.femalewith medical history significant of A.Fib on Eliquis, DM2, BRCA in remission on tamoxifen. Patient presents to the ED with c/o dizziness and vomiting. Symptoms onset shortly after waking up this morning. Symptoms worse with ambulation. Associated  vomiting. No fevers, chest pain, SOB, abd pain, dysuria. Has just felt generally unwell.   ED Course:LFT elevations in the 200s, UA shows 6-30 WBC, no leuk esterase. Tm 99.4.  Concerning however, patient had very similar presentation last October with LFT elevations, etc. Was sent home and had to be called back and admitted after her BCx came back positive for E.Coli from a UTI (positive UA that time though).  Hospital Course:  Principal Problem:   Transaminitis Active Problems:   DM2 (diabetes mellitus, type 2) ([facility]   Essential hypertension, benign   Breast cancer of upper-outer quadrant of right female breast ([facility])   PAF (paroxysmal atrial fibrillation) ([facility])   Sinus bradycardia   Bacteremia   Sepsis presented on admission with leukocytosis , lactic acidosis,elevated lft and cr source of infect from ecoli bacteremia/ uti: -Similar presentation as of last year when she was admitted to hospital in 12/2015 -urine and Blood culuture + pan sensitive ecoli, she received iv rocephin 2g in the hospital - she is discharged on amoxicillin for additional 10days to finish total of 14day abx treatment  Elevated lft, likely from sepsis -Ct ab in 2017 "Hepatobiliary: Scattered small hypodensities are noted within the liver, nonspecific but possibly reflecting small cysts. A large stone is noted within the gallbladder. The gallbladder is otherwise unremarkable. The common bile duct remains normal in caliber.: -lft remainelevated, ab uKoreadid not show acute abnormalities  -hepatitis panel negative in 12/2015, will not repeat. -hold amiodarone and statin for now, -pmd to repeat cmp and decide when to resume amiodarone and statin. Avoid hepatic toxin  PAF, sinus bradycardia: -  hold amiodarone due to elevated lft -reduced betablocker due to sepsis while hospitalized, she is discharged on home dose betablocker. pmd to follow up. -continue elliquis  Chronic diastolic chf: currently  euvolemic to dry lasix held since admission, she received gentle hydration, now off ivf,close monitor volume status Advise to  Restart lasix on 8/15, hold lisinopril, continue metoprolol   HTN,presented with sepsis and elevated cr,  She is discharged on norvasc, doxazosin, metoprolol, Lisinopril held.  pmd to repeat labs and continue adjust meds   noninsulin dependent dm2;   home oral meds held in the hospital, resumed at discharge  she received ssi while in the hospital  a1c 6.1   CKDIII,cr slightly elevated from baseline Renal dosing meds, avoid hypotension Lasix and lisinopril held since admission, advise to continue hold lasix for another day, then resume Continue hold lisinopril, pmd follow up to repeat labs, further meds adjustment per pmd.  H/o breast cancer,continue tamoxifen  FTT: she did well with PT, she is discharged home with home health RN for meds management.  Code Status:full  Family Communication:patient and daughter  Disposition Plan:home    Consultants:  none  Procedures:  none  Antibiotics:  Rocephin from 8/10  11/04/16 Patient is here today for follow-up. She feels much better. She is still taking her antibiotics although she is almost completed the. She denies any fever. Her fatigue has improved. Her blood pressure today is normal though slightly on the low side for this patient. She has some mild trace edema in both legs. She is currently on Lasix 80 mg twice a day. She denies any shortness of breath or chest pain or chest congestion. Lungs are clear to auscultation bilaterally. Amiodarone and Lipitor were held due to acute elevations in her liver function tests. Labs drawn on August 17 showed marked improvement although liver function tests were still slightly elevated. Lisinopril was held due to acute renal insufficiency in the setting of chronic kidney disease.  Most recent creatinine drawn on August 17 was back to the  patient's baseline at 1.3 although lisinopril is still being held due to low blood pressure. Patient's culture revealed pansensitive Escherichia coli. She was discharged home on amoxicillin.  At that time, my plan was: Clinically the patient is doing much better. I will repeat a urine culture to ensure resolution of her urinary tract infection along with a blood culture ensure resolution of the Escherichia coli bacteremia. Repeat a CBC today along with a CMP. Once liver function tests have improved, I would restart the patient's Lipitor and amiodarone. I would continue to hold lisinopril at the present time as her blood pressure is soft. I anticipate likely resuming the lisinopril in one week after the patient has had adequate time to recover.  Based on her weight, I believe that she is now euvolemic.  12/03/16 Patient has gained 6 pounds since her last office visit. She denies any chest pain. She denies any shortness of breath. She denies any orthopnea. However her legs are swelling substantially. She has +1 pitting edema in her legs from her ankles to her knees. There is no weeping edema. She is taking Lasix 80 mg twice daily. She is compliant with her medication. She is also on amlodipine. Her creatinine had risen from her hospital discharge but was still within her normal range when we last checked it she denies consuming more sodium. Past Medical History:  Diagnosis Date  . Anemia   . Atrial fibrillation ([place])   . Breast cancer (  [place]) 10/2013   right upper outer  . Cancer ([place])    right breast  . Complication of anesthesia    slow to wake up  . Diabetes mellitus without complication ([place])   . Dysrhythmia 10/15   AF  . Former smoker   . Full dentures   . Hyperlipidemia   . Hypertension   . Multinodular goiter   . Radiation    Right Breast  . Thyroid disease    hypothyroidism  . Wears glasses    Past Surgical History:  Procedure Laterality Date  . ABDOMINAL HYSTERECTOMY    . AXILLARY  LYMPH NODE DISSECTION Right 01/09/2014   Procedure: RIGHT AXILLARY LYMPH NODE DISECTION;  Surgeon: [name], MD;  Location: [facility];  Service: General;  Laterality: Right;  . BREAST LUMPECTOMY WITH RADIOACTIVE SEED LOCALIZATION Right 01/09/2014   Procedure: RIGHT BREAST SEED LOCALIZED LUMPECTOMY ;  Surgeon: [name], MD;  Location: [facility];  Service: General;  Laterality: Right;  . EYE SURGERY  12/22/2009   cataracts  . INTRAMEDULLARY (IM) NAIL INTERTROCHANTERIC Left 02/24/2014   Procedure: IM ROD LEFT HIP FX;  Surgeon: Alta Corning, MD;  Location: [facility];  Service: Orthopedics;  Laterality: Left;  . PORT-A-CATH REMOVAL Right 01/09/2014   Procedure: REMOVAL PORT-A-CATH;  Surgeon: [name], MD;  Location: [facility];  Service: General;  Laterality: Right;  . PORTACATH PLACEMENT N/A 11/15/2013   Procedure: INSERTION PORT-A-CATH WITH ULTRA SOUND AND FLOROSCOPY;  Surgeon: [name], MD;  Location: [facility];  Service: General;  Laterality: N/A;  . TOTAL KNEE ARTHROPLASTY  2002   left   Current Outpatient Prescriptions on File Prior to Visit  Medication Sig Dispense Refill  . amLODipine (NORVASC) 5 MG tablet TAKE 1 TABLET (5 MG TOTAL) BY MOUTH DAILY. 30 tablet 3  . Carboxymeth-Glycerin-Polysorb (REFRESH OPTIVE ADVANCED OP) Place 1 drop into both eyes 3 (three) times daily.    Marland Kitchen doxazosin (CARDURA) 4 MG tablet TAKE 1 TABLET BY MOUTH EVERY DAY 90 tablet 2  . ELIQUIS 5 MG TABS tablet TAKE 1 TABLET TWICE A DAY 60 tablet 10  . furosemide (LASIX) 40 MG tablet Take 2 tablets (80 mg total) by mouth 2 (two) times daily. Please start to take lasix on 8/15 60 tablet 0  . KLOR-CON M20 20 MEQ tablet TAKE ONE TABLET BY MOUTH ONCE DAILY 30 tablet 3  . lisinopril (PRINIVIL,ZESTRIL) 20 MG tablet Take 1 tablet (20 mg total) by mouth daily. 90 tablet 3  . metoprolol succinate (TOPROL-XL) 100 MG 24 hr tablet TAKE 1 TABLET BY MOUTH TWICE A DAY WITH OR  IMMEDIATELY FOLLOWING A MEAL 180 tablet 1  . Sennosides (EX-LAX) 15 MG TABS Take 1 tablet by mouth daily as needed (CONSTIPATION).    Marland Kitchen sitaGLIPtin (JANUVIA) 100 MG tablet Take 1 tablet (100 mg total) by mouth daily. 90 tablet 1  . tamoxifen (NOLVADEX) 20 MG tablet TAKE 1 TABLET BY MOUTH DAILY 90 tablet 3   No current facility-administered medications on file prior to visit.    Allergies  Allergen Reactions  . Tape Other (See Comments)    Skin is somewhat sensitive   Social History   Social History  . Marital status: Widowed    Spouse name: N/A  . Number of children: 4  . Years of education: N/A   Occupational History  . Not on file.   Social History Main Topics  . Smoking status: Former Smoker  Packs/day: 1.00    Years: 5.00    Quit date: 11/14/1958  . Smokeless tobacco: Never Used  . Alcohol use No  . Drug use: No  . Sexual activity: No     Comment: menarche age 46, P66, first birth age 84, no HRT, menopause age 46   Other Topics Concern  . Not on file   Social History Narrative  . No narrative on file    eReview of Systems  All other systems reviewed and are negative.      Objective:   Physical Exam  Constitutional: She is oriented to person, place, and time. She appears well-developed and well-nourished. No distress.  Neck: Neck supple. No JVD present.  Cardiovascular: Normal rate, regular rhythm, normal heart sounds and intact distal pulses.   No murmur heard. Pulmonary/Chest: Effort normal and breath sounds normal. No respiratory distress. She has no wheezes. She has no rales.  Abdominal: Soft. Bowel sounds are normal. She exhibits no distension. There is no tenderness. There is no rebound and no guarding.  Musculoskeletal: She exhibits edema.  Lymphadenopathy:    She has no cervical adenopathy.  Neurological: She is alert and oriented to person, place, and time. No cranial nerve deficit. She exhibits normal muscle tone. Coordination normal.  Skin: No  rash noted. She is not diaphoretic.  Psychiatric: She has a normal mood and affect. Her behavior is normal. Judgment and thought content normal.  Vitals reviewed.         Assessment & Plan:  Leg swelling - Plan: BASIC METABOLIC PANEL WITH GFR Discontinue amlodipine. Continue furosemide 80 mg by mouth twice a day. I will give the patient Zaroxolyn 5 mg. She is to take 1 tablet in the morning 30 minutes before Lasix. She is to repeat that on Saturday and then stopped taking the Zaroxolyn. I will see her back on Monday to recheck the swelling in her legs and repeat her renal function test. I will obtain a baseline BMP today. Hopefully this will help push the diuresis. Hopefully after she discontinues amlodipine we will not have to repeat this process in the future

## 2016-12-04 ENCOUNTER — Encounter: Payer: Self-pay | Admitting: Family Medicine

## 2016-12-04 ENCOUNTER — Telehealth: Payer: Self-pay | Admitting: Family Medicine

## 2016-12-04 NOTE — Telephone Encounter (Signed)
Family member called and had questions about mother's medications.  Tried to call back.  No answer No voicemail

## 2016-12-07 ENCOUNTER — Ambulatory Visit (INDEPENDENT_AMBULATORY_CARE_PROVIDER_SITE_OTHER): Payer: Medicare Other | Admitting: Family Medicine

## 2016-12-07 ENCOUNTER — Encounter: Payer: Self-pay | Admitting: Family Medicine

## 2016-12-07 VITALS — BP 110/62 | HR 52 | Temp 97.8°F | Resp 16 | Ht 66.5 in | Wt 181.0 lb

## 2016-12-07 DIAGNOSIS — M7989 Other specified soft tissue disorders: Secondary | ICD-10-CM

## 2016-12-07 DIAGNOSIS — Z23 Encounter for immunization: Secondary | ICD-10-CM

## 2016-12-07 LAB — BASIC METABOLIC PANEL WITH GFR
BUN/Creatinine Ratio: 23 (calc) — ABNORMAL HIGH (ref 6–22)
BUN: 56 mg/dL — ABNORMAL HIGH (ref 7–25)
CO2: 30 mmol/L (ref 20–32)
Calcium: 8.7 mg/dL (ref 8.6–10.4)
Chloride: 98 mmol/L (ref 98–110)
Creat: 2.47 mg/dL — ABNORMAL HIGH (ref 0.60–0.88)
GFR, Est African American: 20 mL/min/{1.73_m2} — ABNORMAL LOW (ref >=60)
GFR, Est Non African American: 17 mL/min/{1.73_m2} — ABNORMAL LOW (ref >=60)
Glucose, Bld: 167 mg/dL — ABNORMAL HIGH (ref 65–99)
Potassium: 3.1 mmol/L — ABNORMAL LOW (ref 3.5–5.3)
Sodium: 139 mmol/L (ref 135–146)

## 2016-12-07 NOTE — Addendum Note (Signed)
Addended by: Shary Decamp B on: 12/07/2016 02:04 PM   Modules accepted: Orders

## 2016-12-07 NOTE — Progress Notes (Signed)
Subjective:    Patient ID: Whitney [name], female    DOB: Feb 19, 1928, 83 y.o.   MRN: [id number]  HPI Patient was recently admitted to the hospital for urosepsis. I have copied relevant portions of the discharge summary and included them below for my reference:  Admit date: 10/23/2016 Discharge date: 10/26/2016  Time spent: <49mns  Recommendations for Outpatient Follow-up:  1. F/u with PMD within a week  for hospital discharge follow up, repeat cbc/cmp at follow up, pmd decicde when to restart amiodarone and lipitor pending lft improvement. Patient is advised to check blood pressure at home, bring in record for your doctor to reveiw and further adjust blood pressure meds as need 2. F/u with endocrinology for diabetes control   Discharge Diagnoses:       Active Hospital Problems   Diagnosis Date Noted  . Transaminitis 10/23/2016  . Bacteremia 10/25/2016  . Sinus bradycardia 10/23/2016  . [facility]) [facility] 01/[facility]/2014  . Breast cancer of upper-outer quadrant of right female breast [place] 11/01/2013  . Essential hypertension, benign 12/01/2012  . DM2 (diabetes mellitus, type 2) ([facility] 11/21/2012    Resolved Hospital Problems   Diagnosis Date Noted Date Resolved  No resolved problems to display.    Discharge Condition: stable  Diet recommendation: heart healthy/carb modified     Filed Weights   10/24/16 0509  Weight: 79.8 kg (176 lb)    History of present illness:  PCP: PSusy Frizzle MD  Patient coming from: Home  I have personally briefly reviewed [place] Chief Complaint: Dizziness  HPI: Whitney BiekerMooreis a 83y.o.femalewith medical history significant of A.Fib on Eliquis, DM2, BRCA in remission on tamoxifen. Patient presents to the ED with c/o dizziness and vomiting. Symptoms onset shortly after waking up this morning. Symptoms worse with ambulation. Associated  vomiting. No fevers, chest pain, SOB, abd pain, dysuria. Has just felt generally unwell.   ED Course:LFT elevations in the 200s, UA shows 6-30 WBC, no leuk esterase. Tm 99.4.  Concerning however, patient had very similar presentation last October with LFT elevations, etc. Was sent home and had to be called back and admitted after her BCx came back positive for E.Coli from a UTI (positive UA that time though).  Hospital Course:  Principal Problem:   Transaminitis Active Problems:   DM2 (diabetes mellitus, type 2) ([facility]   Essential hypertension, benign   Breast cancer of upper-outer quadrant of right female breast (H[facility])   PAF (paroxysmal atrial fibrillation) (H[facility])   Sinus bradycardia   Bacteremia   Sepsis presented on admission with leukocytosis , lactic acidosis,elevated lft and cr source of infect from ecoli bacteremia/ uti: -Similar presentation as of last year when she was admitted to hospital in 12/2015 -urine and Blood culuture + pan sensitive ecoli, she received iv rocephin 2g in the hospital - she is discharged on amoxicillin for additional 10days to finish total of 14day abx treatment  Elevated lft, likely from sepsis -Ct ab in 2017 "Hepatobiliary: Scattered small hypodensities are noted within the liver, nonspecific but possibly reflecting small cysts. A large stone is noted within the gallbladder. The gallbladder is otherwise unremarkable. The common bile duct remains normal in caliber.: -lft remainelevated, ab uKoreadid not show acute abnormalities  -hepatitis panel negative in 12/2015, will not repeat. -hold amiodarone and statin for now, -pmd to repeat cmp and decide when to resume amiodarone and statin. Avoid hepatic toxin  PAF, sinus bradycardia: -  hold amiodarone due to elevated lft -reduced betablocker due to sepsis while hospitalized, she is discharged on home dose betablocker. pmd to follow up. -continue elliquis  Chronic diastolic chf: currently  euvolemic to dry lasix held since admission, she received gentle hydration, now off ivf,close monitor volume status Advise to  Restart lasix on 8/15, hold lisinopril, continue metoprolol   HTN,presented with sepsis and elevated cr,  She is discharged on norvasc, doxazosin, metoprolol, Lisinopril held.  pmd to repeat labs and continue adjust meds   noninsulin dependent dm2;   home oral meds held in the hospital, resumed at discharge  she received ssi while in the hospital  a1c 6.1   CKDIII,cr slightly elevated from baseline Renal dosing meds, avoid hypotension Lasix and lisinopril held since admission, advise to continue hold lasix for another day, then resume Continue hold lisinopril, pmd follow up to repeat labs, further meds adjustment per pmd.  H/o breast cancer,continue tamoxifen  FTT: she did well with PT, she is discharged home with home health RN for meds management.  Code Status:full  Family Communication:patient and daughter  Disposition Plan:home    Consultants:  none  Procedures:  none  Antibiotics:  Rocephin from 8/10  11/04/16 Patient is here today for follow-up. She feels much better. She is still taking her antibiotics although she is almost completed the. She denies any fever. Her fatigue has improved. Her blood pressure today is normal though slightly on the low side for this patient. She has some mild trace edema in both legs. She is currently on Lasix 80 mg twice a day. She denies any shortness of breath or chest pain or chest congestion. Lungs are clear to auscultation bilaterally. Amiodarone and Lipitor were held due to acute elevations in her liver function tests. Labs drawn on August 17 showed marked improvement although liver function tests were still slightly elevated. Lisinopril was held due to acute renal insufficiency in the setting of chronic kidney disease.  Most recent creatinine drawn on August 17 was back to the  patient's baseline at 1.3 although lisinopril is still being held due to low blood pressure. Patient's culture revealed pansensitive Escherichia coli. She was discharged home on amoxicillin.  At that time, my plan was: Clinically the patient is doing much better. I will repeat a urine culture to ensure resolution of her urinary tract infection along with a blood culture ensure resolution of the Escherichia coli bacteremia. Repeat a CBC today along with a CMP. Once liver function tests have improved, I would restart the patient's Lipitor and amiodarone. I would continue to hold lisinopril at the present time as her blood pressure is soft. I anticipate likely resuming the lisinopril in one week after the patient has had adequate time to recover.  Based on her weight, I believe that she is now euvolemic.  12/03/16 Patient has gained 6 pounds since her last office visit. She denies any chest pain. She denies any shortness of breath. She denies any orthopnea. However her legs are swelling substantially. She has +1 pitting edema in her legs from her ankles to her knees. There is no weeping edema. She is taking Lasix 80 mg twice daily. She is compliant with her medication. She is also on amlodipine. Her creatinine had risen from her hospital discharge but was still within her normal range when we last checked it she denies consuming more sodium.  At that time, my plan was: Discontinue amlodipine. Continue furosemide 80 mg by mouth twice a day. I will  give the patient Zaroxolyn 5 mg. She is to take 1 tablet in the morning 30 minutes before Lasix. She is to repeat that on Saturday and then stopped taking the Zaroxolyn. I will see her back on Monday to recheck the swelling in her legs and repeat her renal function test. I will obtain a baseline BMP today. Hopefully this will help push the diuresis. Hopefully after she discontinues amlodipine we will not have to repeat this process in the future  12/07/16 Since I last  saw the patient, she has diuresed well and lost 6 pounds. She continues to have swelling in both legs however the swelling is now limited to distal to the mid shin. There is trace swelling in the right leg and +1 swelling in the left leg but this is much better than last week. She is still not wearing compression hose. Past Medical History:  Diagnosis Date  . Anemia   . Atrial fibrillation ([place])   . Breast cancer ([place]) 10/2013   right upper outer  . Cancer ([place])    right breast  . Complication of anesthesia    slow to wake up  . Diabetes mellitus without complication ([place])   . Dysrhythmia 10/15   AF  . Former smoker   . Full dentures   . Hyperlipidemia   . Hypertension   . Multinodular goiter   . Radiation    Right Breast  . Thyroid disease    hypothyroidism  . Wears glasses    Past Surgical History:  Procedure Laterality Date  . ABDOMINAL HYSTERECTOMY    . AXILLARY LYMPH NODE DISSECTION Right 01/09/2014   Procedure: RIGHT AXILLARY LYMPH NODE DISECTION;  Surgeon: [name], MD;  Location: [facility];  Service: General;  Laterality: Right;  . BREAST LUMPECTOMY WITH RADIOACTIVE SEED LOCALIZATION Right 01/09/2014   Procedure: RIGHT BREAST SEED LOCALIZED LUMPECTOMY ;  Surgeon: [name], MD;  Location: [facility];  Service: General;  Laterality: Right;  . EYE SURGERY  12/22/2009   cataracts  . INTRAMEDULLARY (IM) NAIL INTERTROCHANTERIC Left 02/24/2014   Procedure: IM ROD LEFT HIP FX;  Surgeon: Alta Corning, MD;  Location: [facility];  Service: Orthopedics;  Laterality: Left;  . PORT-A-CATH REMOVAL Right 01/09/2014   Procedure: REMOVAL PORT-A-CATH;  Surgeon: [name], MD;  Location: [facility];  Service: General;  Laterality: Right;  . PORTACATH PLACEMENT N/A 11/15/2013   Procedure: INSERTION PORT-A-CATH WITH ULTRA SOUND AND FLOROSCOPY;  Surgeon: [name], MD;  Location: [facility];  Service: General;  Laterality: N/A;  . TOTAL  KNEE ARTHROPLASTY  2002   left   Current Outpatient Prescriptions on File Prior to Visit  Medication Sig Dispense Refill  . Carboxymeth-Glycerin-Polysorb (REFRESH OPTIVE ADVANCED OP) Place 1 drop into both eyes 3 (three) times daily.    Marland Kitchen doxazosin (CARDURA) 4 MG tablet TAKE 1 TABLET BY MOUTH EVERY DAY 90 tablet 2  . ELIQUIS 5 MG TABS tablet TAKE 1 TABLET TWICE A DAY 60 tablet 10  . furosemide (LASIX) 40 MG tablet Take 2 tablets (80 mg total) by mouth 2 (two) times daily. Please start to take lasix on 8/15 60 tablet 0  . glimepiride (AMARYL) 1 MG tablet TAKE 1/2 TABLET BY MOUTH TWICE A DAY 30 tablet 3  . KLOR-CON M20 20 MEQ tablet TAKE ONE TABLET BY MOUTH ONCE DAILY 30 tablet 3  . lisinopril (PRINIVIL,ZESTRIL) 20 MG tablet Take 1 tablet (20 mg total) by mouth daily. 90 tablet  3  . metolazone (ZAROXOLYN) 5 MG tablet Take 1 tablet (5 mg total) by mouth daily. Take 30 minutes before furosemide in the morning as directed (NOT EVERYDAY) 10 tablet 0  . metoprolol succinate (TOPROL-XL) 100 MG 24 hr tablet TAKE 1 TABLET BY MOUTH TWICE A DAY WITH OR IMMEDIATELY FOLLOWING A MEAL 180 tablet 1  . Sennosides (EX-LAX) 15 MG TABS Take 1 tablet by mouth daily as needed (CONSTIPATION).    Marland Kitchen sitaGLIPtin (JANUVIA) 100 MG tablet Take 1 tablet (100 mg total) by mouth daily. 90 tablet 1  . tamoxifen (NOLVADEX) 20 MG tablet TAKE 1 TABLET BY MOUTH DAILY 90 tablet 3   No current facility-administered medications on file prior to visit.    Allergies  Allergen Reactions  . Tape Other (See Comments)    Skin is somewhat sensitive   Social History   Social History  . Marital status: Widowed    Spouse name: N/A  . Number of children: 4  . Years of education: N/A   Occupational History  . Not on file.   Social History Main Topics  . Smoking status: Former Smoker    Packs/day: 1.00    Years: 5.00    Quit date: 11/14/1958  . Smokeless tobacco: Never Used  . Alcohol use No  . Drug use: No  . Sexual activity:  No     Comment: menarche age 51, P5, first birth age 80, no HRT, menopause age 77   Other Topics Concern  . Not on file   Social History Narrative  . No narrative on file    eReview of Systems  All other systems reviewed and are negative.      Objective:   Physical Exam  Constitutional: She is oriented to person, place, and time. She appears well-developed and well-nourished. No distress.  Neck: Neck supple. No JVD present.  Cardiovascular: Normal rate, regular rhythm, normal heart sounds and intact distal pulses.   No murmur heard. Pulmonary/Chest: Effort normal and breath sounds normal. No respiratory distress. She has no wheezes. She has no rales.  Abdominal: Soft. Bowel sounds are normal. She exhibits no distension. There is no tenderness. There is no rebound and no guarding.  Musculoskeletal: She exhibits edema.  Lymphadenopathy:    She has no cervical adenopathy.  Neurological: She is alert and oriented to person, place, and time. No cranial nerve deficit. She exhibits normal muscle tone. Coordination normal.  Skin: No rash noted. She is not diaphoretic.  Psychiatric: She has a normal mood and affect. Her behavior is normal. Judgment and thought content normal.  Vitals reviewed.         Assessment & Plan:   Leg swelling - Plan: BASIC METABOLIC PANEL WITH GFR  Swelling is much better. I want the patient to stay off amlodipine indefinitely. Continue Lasix 80 mg twice daily. Repeat a BMP today to monitor renal fcn and potassium.  Discontinue Zaroxolyn. We are only going to use this medication as needed for times when she is not diuresing. I did give the patient a prescription for compression hose, knee-high, 15-20 mmHg. I asked her to go to the pharmacy today and pick these up and start to wear them immediately. Now that the swelling is under control, hopefully the compression hose will keep it that way plus staying off amlodipine. Patient received her flu shot today.

## 2016-12-08 NOTE — Telephone Encounter (Signed)
Tried to call again, no answer no voice mail

## 2016-12-09 ENCOUNTER — Other Ambulatory Visit: Payer: Self-pay | Admitting: Family Medicine

## 2016-12-09 DIAGNOSIS — N289 Disorder of kidney and ureter, unspecified: Secondary | ICD-10-CM

## 2016-12-11 ENCOUNTER — Other Ambulatory Visit: Payer: Medicare Other

## 2016-12-11 DIAGNOSIS — N289 Disorder of kidney and ureter, unspecified: Secondary | ICD-10-CM | POA: Diagnosis not present

## 2016-12-11 LAB — BASIC METABOLIC PANEL
BUN/Creatinine Ratio: 30 (calc) — ABNORMAL HIGH (ref 6–22)
BUN: 75 mg/dL — ABNORMAL HIGH (ref 7–25)
CO2: 29 mmol/L (ref 20–32)
Calcium: 8.7 mg/dL (ref 8.6–10.4)
Chloride: 101 mmol/L (ref 98–110)
Creat: 2.46 mg/dL — ABNORMAL HIGH (ref 0.60–0.88)
Glucose, Bld: 210 mg/dL — ABNORMAL HIGH (ref 65–99)
Potassium: 3.3 mmol/L — ABNORMAL LOW (ref 3.5–5.3)
Sodium: 142 mmol/L (ref 135–146)

## 2016-12-15 ENCOUNTER — Other Ambulatory Visit: Payer: Self-pay | Admitting: Family Medicine

## 2016-12-15 DIAGNOSIS — N289 Disorder of kidney and ureter, unspecified: Secondary | ICD-10-CM

## 2016-12-18 ENCOUNTER — Other Ambulatory Visit: Payer: Medicare Other

## 2016-12-18 DIAGNOSIS — N289 Disorder of kidney and ureter, unspecified: Secondary | ICD-10-CM | POA: Diagnosis not present

## 2016-12-18 LAB — BASIC METABOLIC PANEL
BUN/Creatinine Ratio: 27 (calc) — ABNORMAL HIGH (ref 6–22)
BUN: 52 mg/dL — ABNORMAL HIGH (ref 7–25)
CO2: 28 mmol/L (ref 20–32)
Calcium: 8.5 mg/dL — ABNORMAL LOW (ref 8.6–10.4)
Chloride: 105 mmol/L (ref 98–110)
Creat: 1.93 mg/dL — ABNORMAL HIGH (ref 0.60–0.88)
Glucose, Bld: 227 mg/dL — ABNORMAL HIGH (ref 65–99)
Potassium: 3.9 mmol/L (ref 3.5–5.3)
Sodium: 140 mmol/L (ref 135–146)

## 2016-12-22 ENCOUNTER — Telehealth: Payer: Self-pay

## 2016-12-22 NOTE — Telephone Encounter (Signed)
Spoke with patient and her daughter [place] and explained she should not be taking metolazone

## 2016-12-22 NOTE — Telephone Encounter (Signed)
SHE IS NOT SUPPOSED TO BE TAKING METALOZONE.    Ok to up lasix to 40 bid.

## 2016-12-22 NOTE — Telephone Encounter (Signed)
Caryl Pina with [facility] called and states patient takes 5mg  of metolazone on fri and sat and her lasix 40 mg once daily which was changed from 80mg  bid per patient . Caryl Pina states patient weight had gone down to 174.8 and is now 181lb an also fluid is going back up.

## 2017-01-03 ENCOUNTER — Other Ambulatory Visit: Payer: Self-pay | Admitting: Family Medicine

## 2017-01-29 ENCOUNTER — Ambulatory Visit: Payer: Medicare Other | Admitting: Endocrinology

## 2017-01-29 ENCOUNTER — Encounter: Payer: Self-pay | Admitting: Endocrinology

## 2017-01-29 VITALS — BP 140/78 | HR 58 | Ht 66.5 in | Wt 189.0 lb

## 2017-01-29 DIAGNOSIS — E1165 Type 2 diabetes mellitus with hyperglycemia: Secondary | ICD-10-CM | POA: Diagnosis not present

## 2017-01-29 DIAGNOSIS — E042 Nontoxic multinodular goiter: Secondary | ICD-10-CM

## 2017-01-29 LAB — POCT GLYCOSYLATED HEMOGLOBIN (HGB A1C): Hemoglobin A1C: 6.2

## 2017-01-29 NOTE — Progress Notes (Signed)
Patient ID: Whitney [name], female   DOB: 1927/11/20, 83 y.o.   MRN: [id number]   Reason for Appointment: Diabetes and other problems  History of Present Illness     Type 2 DIABETES MELITUS, date of diagnosis:  1983     Previous history: She had been taking metformin and Actos for several years.  She usually has had excellent control with upper normal A1c  Recent history:   Oral hypoglycemic drugs: Januvia 50 mg daily    She was previously on  Metformin and Actos; because of edema she was told to stop Actos Her metformin was stopped because of renal dysfunction and Amaryl because of hypoglycemia Now she is on Januvia 50 mg alone  Current blood sugar patterns, management details:  As usual she checks blood sugars 1-2 hours after her evening meal, this is ranging from 147 up to 285   Does occasionally check blood sugars in the late afternoon also and these are similar with lowest reading 123  She has been recommended increased activities with walking but she does not like is also  She thinks she is eating low fat meals and avoiding drinks with sugar usually  Does not have any significant change in her renal function and continues to take lower doses of Januvia because of renal dysfunction   Her A1c usually lower than expected for her blood sugars, most recent 6.2 %        Monitors blood glucose:   less than once a day .    Glucometer: One Touch.           Overall MEDIAN blood sugar at night 172, was168 previously  Physical activity: exercise: Minimal  Diet: She Is avoiding fried and fast food, sometimes will have some light lemonade                Wt Readings from Last 3 Encounters:  01/29/17 189 lb (85.7 kg)  12/07/16 181 lb (82.1 kg)  12/03/16 187 lb (84.8 kg)    LABS:  Lab Results  Component Value Date   HGBA1C 6.2 01/29/2017   HGBA1C 6.1 (H) 10/25/2016   HGBA1C 6.3 06/24/2016   Lab Results  Component Value Date   MICROALBUR 6.7 (H)  09/16/2015   LDLCALC 85 09/16/2015   CREATININE 1.93 (H) 12/18/2016     OTHER problems  are discussed in review of systems      Allergies as of 01/29/2017      Reactions   Tape Other (See Comments)   Skin is somewhat sensitive      Medication List        Accurate as of 01/29/17 11:59 PM. Always use your most recent med list.          doxazosin 4 MG tablet Commonly known as:  CARDURA TAKE 1 TABLET BY MOUTH EVERY DAY   ELIQUIS 5 MG Tabs tablet Generic drug:  apixaban TAKE 1 TABLET TWICE A DAY   EX-LAX 15 MG Tabs Generic drug:  Sennosides Take 1 tablet by mouth daily as needed (CONSTIPATION).   furosemide 40 MG tablet Commonly known as:  LASIX Take 2 tablets (80 mg total) by mouth 2 (two) times daily. Please start to take lasix on 8/15   glimepiride 1 MG tablet Commonly known as:  AMARYL TAKE 1/2 TABLET BY MOUTH TWICE A DAY   JANUVIA 100 MG tablet Generic drug:  sitaGLIPtin TAKE 1 TABLET BY MOUTH EVERY DAY   KLOR-CON M20 20 MEQ tablet Generic  drug:  potassium chloride SA TAKE ONE TABLET BY MOUTH ONCE DAILY   lisinopril 20 MG tablet Commonly known as:  PRINIVIL,ZESTRIL Take 1 tablet (20 mg total) by mouth daily.   metoprolol succinate 100 MG 24 hr tablet Commonly known as:  TOPROL-XL TAKE 1 TABLET BY MOUTH TWICE A DAY WITH OR IMMEDIATELY FOLLOWING A MEAL   REFRESH OPTIVE ADVANCED OP Place 1 drop into both eyes 3 (three) times daily.   tamoxifen 20 MG tablet Commonly known as:  NOLVADEX TAKE 1 TABLET BY MOUTH DAILY       Allergies:  Allergies  Allergen Reactions  . Tape Other (See Comments)    Skin is somewhat sensitive    Past Medical History:  Diagnosis Date  . Anemia   . Atrial fibrillation ([place])   . Breast cancer ([place]) 10/2013   right upper outer  . Cancer ([place])    right breast  . Complication of anesthesia    slow to wake up  . Diabetes mellitus without complication ([place])   . Dysrhythmia 10/15   AF  . Former smoker   . Full  dentures   . Hyperlipidemia   . Hypertension   . Multinodular goiter   . Radiation    Right Breast  . Thyroid disease    hypothyroidism  . Wears glasses     Past Surgical History:  Procedure Laterality Date  . ABDOMINAL HYSTERECTOMY    . EYE SURGERY  12/22/2009   cataracts  . IM ROD LEFT HIP FX Left 02/24/2014   Performed by Alta Corning, MD at [facility]  . INSERTION PORT-A-CATH WITH ULTRA SOUND AND FLOROSCOPY N/A 11/15/2013   Performed by [name], MD at [facility] Right 01/09/2014   Performed by [name], MD at [facility]  . RIGHT AXILLARY LYMPH NODE DISECTION Right 01/09/2014   Performed by [name], MD at [facility]  . RIGHT BREAST SEED LOCALIZED LUMPECTOMY Right 01/09/2014   Performed by [name], [facility] at [facility]  . TOTAL KNEE ARTHROPLASTY  2002   left    No family history on file.  Social History:  reports that she quit smoking about 58 years ago. She has a 5.00 pack-year smoking history. she has never used smokeless tobacco. She reports that she does not drink alcohol or use drugs.  Review of Systems:   Hypertension:  She is taking lisinopril 20 mg daily, 4 mg doxazosin, 100 mg of metoprolol   Does not monitor BP at home  EDEMA She is taking Lasix 80 mg daily for edema, prescribed by PCP Also she is wearing elastic stockings Edema is only mild   Renal function: Creatinine has been variable, followed by PCP   Lab Results  Component Value Date   CREATININE 1.93 (H) 12/18/2016   BUN 52 (H) 12/18/2016   NA 140 12/18/2016   K 3.9 12/18/2016   CL 105 12/18/2016   CO2 28 12/18/2016    Lipids: Has been on long-term Lipitor with good control usually, has not had follow-up for this from PCP    Lab Results  Component Value Date   CHOL 162 09/16/2015   HDL 66.80 09/16/2015   LDLCALC 85 09/16/2015   TRIG 48.0 09/16/2015   CHOLHDL 2 09/16/2015    She has had vitamin  D deficiency, Treated with 2000 units vitamin D 3  HYPOTHYROIDISM: She has had a multinodular goiter  Levothyroxine supplements were stopped  in 5/16 and she has continued to be euthyroid  Her amiodarone has been stopped earlier this year   Lab Results  Component Value Date   TSH 0.25 (L) 10/30/2016   TSH 0.53 06/24/2016   TSH 0.54 03/26/2016   FREET4 1.50 10/30/2016   FREET4 1.27 06/24/2016   FREET4 1.26 03/19/2015    Last foot exam in 7/17   ADRENAL mass:  She had a CT scan done previously and was found to have a left adrenal mass  She was evaluated previously by a PET scan after  diagnosis of breast cancer in 2015 She was found to have a 3.7 cm hypermetabolic left adrenal mass On a previous CT scan in 2011 she also had a 3.8 cm adrenal mass Cortisol level is normal in 2015 and 2016 No history of hypokalemia or severe hypertension/palpitations, headaches, flushing or significant fluctuations in blood pressure Had evaluation with metanephrines and overnight dexamethasone test indicating normal adrenal function   Examination:   BP 140/78   Pulse (!) 58   Ht 5' 6.5" (1.689 m)   Wt 189 lb (85.7 kg)   SpO2 95%   BMI 30.05 kg/m   Body mass index is 30.05 kg/m.     1+ ankle edema present  ASSESSMENT/ PLAN:   HYPERTENSION: Blood pressure is well controlled   EDEMA: Controlled with  Lasix and will continue, probably does have some venous insufficiency also  Diabetes type 2   See history of present illness for  discussion of current diabetes management, blood sugar patterns and problems identified  Her A1c is stable at 6.2 Considering her age her blood sugar control is fairly good even though she has some sporadic high readings after evening meal up to 285 She is doing well with Januvia 50 mg daily, reduced dose because of renal dysfunction  She has reasonable glucose control with Januvia 50 mg daily A1c may be lower than expected with her renal dysfunction and  anemia Discussed that she should take her blood sugars 2 hours after eating and not within 1 hour and some readings after breakfast and lunch also   GOITER: Her last TSH was slightly low and will need follow-up   Deundra Furber 01/30/2017, 2:19 PM    Note: This office note was prepared with Dragon voice recognition system technology. Any transcriptional errors that result from this process are unintentional.

## 2017-01-29 NOTE — Patient Instructions (Addendum)
Check blood sugars on waking up  weekly  Also check blood sugars about 2 hours after a meal and do this after different meals by rotation  Recommended blood sugar levels on waking up is 90-130 and about 2 hours after meal is 130-160  Please bring your blood sugar monitor to each visit, thank you  Next time wear socks

## 2017-01-30 ENCOUNTER — Other Ambulatory Visit: Payer: Self-pay | Admitting: Endocrinology

## 2017-01-30 ENCOUNTER — Encounter: Payer: Self-pay | Admitting: Endocrinology

## 2017-02-09 ENCOUNTER — Other Ambulatory Visit: Payer: Self-pay

## 2017-02-09 MED ORDER — POTASSIUM CHLORIDE CRYS ER 20 MEQ PO TBCR: 20.0000 meq | EXTENDED_RELEASE_TABLET | Freq: Every day | ORAL | 3 refills | Status: DC

## 2017-02-19 ENCOUNTER — Telehealth: Payer: Self-pay | Admitting: Endocrinology

## 2017-02-19 NOTE — Telephone Encounter (Signed)
Please advise if okay to fill  

## 2017-02-19 NOTE — Telephone Encounter (Signed)
This has been filled 

## 2017-02-19 NOTE — Telephone Encounter (Signed)
Directions will be check blood sugar once a day

## 2017-03-11 ENCOUNTER — Other Ambulatory Visit: Payer: Self-pay | Admitting: Endocrinology

## 2017-03-24 ENCOUNTER — Other Ambulatory Visit: Payer: Self-pay | Admitting: Family Medicine

## 2017-03-27 ENCOUNTER — Other Ambulatory Visit: Payer: Self-pay | Admitting: Endocrinology

## 2017-04-08 ENCOUNTER — Other Ambulatory Visit: Payer: Self-pay | Admitting: Family Medicine

## 2017-04-08 DIAGNOSIS — I10 Essential (primary) hypertension: Secondary | ICD-10-CM

## 2017-04-20 ENCOUNTER — Telehealth: Payer: Self-pay | Admitting: Hematology and Oncology

## 2017-04-20 ENCOUNTER — Inpatient Hospital Stay: Payer: Medicare Other | Attending: Hematology and Oncology | Admitting: Hematology and Oncology

## 2017-04-20 DIAGNOSIS — Z7901 Long term (current) use of anticoagulants: Secondary | ICD-10-CM | POA: Insufficient documentation

## 2017-04-20 DIAGNOSIS — C50411 Malignant neoplasm of upper-outer quadrant of right female breast: Secondary | ICD-10-CM | POA: Diagnosis not present

## 2017-04-20 DIAGNOSIS — Z923 Personal history of irradiation: Secondary | ICD-10-CM

## 2017-04-20 DIAGNOSIS — Z79899 Other long term (current) drug therapy: Secondary | ICD-10-CM | POA: Diagnosis not present

## 2017-04-20 DIAGNOSIS — Z9221 Personal history of antineoplastic chemotherapy: Secondary | ICD-10-CM | POA: Diagnosis not present

## 2017-04-20 DIAGNOSIS — Z170 Estrogen receptor positive status [ER+]: Secondary | ICD-10-CM | POA: Insufficient documentation

## 2017-04-20 DIAGNOSIS — Z7984 Long term (current) use of oral hypoglycemic drugs: Secondary | ICD-10-CM | POA: Diagnosis not present

## 2017-04-20 DIAGNOSIS — Z79810 Long term (current) use of selective estrogen receptor modulators (SERMs): Secondary | ICD-10-CM | POA: Diagnosis not present

## 2017-04-20 DIAGNOSIS — N182 Chronic kidney disease, stage 2 (mild): Secondary | ICD-10-CM | POA: Insufficient documentation

## 2017-04-20 MED ORDER — TAMOXIFEN CITRATE 20 MG PO TABS: 20.0000 mg | ORAL_TABLET | Freq: Every day | ORAL | 3 refills | Status: DC

## 2017-04-20 NOTE — Telephone Encounter (Signed)
Gave patient avs and calendar with appts per 2/5 los °

## 2017-04-20 NOTE — Assessment & Plan Note (Signed)
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 ER positive PR positive HER-2 negative Ki-67 11%. Patient was hospitalized for left hip fracture 02/25/2014, completed adjuvant radiation therapy 06/29/14, Started Tamoxifen 07/20/14 Hospitalization 12/30/2015: Escherichia colibacteremia.  Tamoxifen Toxicities:no toxicities to tamoxifen. Denies any hot flashes or myalgias.  Breast Cancer Surveillance: 1. Breast exam 04/20/2017: no lumps or nodules or any concerns for breast cancer. 2. Mammogram 11/02/2016: No breast cancer, post-op changes density B  Leg edema: Instructed the patient to elevate her legs. She is currently on Lasix Mild chronic kidney disease: This is being monitored by her primary care physician.   RTC in one year For surveillance and follow-up.

## 2017-04-20 NOTE — Progress Notes (Signed)
Patient Care Team: [name], MD as PCP - General (Family Medicine) Nicholas Lose, MD as Consulting Physician (Hematology and Oncology) [name], MD as Consulting Physician (General Surgery) [name], MD (Inactive) as Consulting Physician (Radiation Oncology)  DIAGNOSIS:  Encounter Diagnoses  Name Primary?  . Malignant neoplasm of upper-outer quadrant of right breast in female, estrogen receptor positive ([place])   . Breast cancer of upper-outer quadrant of right female breast ([place])     SUMMARY OF ONCOLOGIC HISTORY:   Breast cancer of upper-outer quadrant of right female breast ([place])   10/20/2013 Mammogram    Ultrasound and mammogram showed 2.1 x 2.1 x 1.9 cm right breast mass      10/20/2013 Initial Diagnosis    Breast cancer of upper-outer quadrant of right female breast. Invasive ductal cancer with lymphovascular invasion one lymph node biopsy that was positive for cancer grade 1; Her 2 Neg Ratio 0.96, ER100% PR 8% positive Ki 67: 11%;      10/31/2013 Breast MRI    Right breast upper outer quadrant: 3.1 x 1.9 x 2.2 cm: Right axillary lymph node 1.1 x 0.9 x 0.6 cm      11/16/2013 - 12/01/2013 Chemotherapy    Neoadjuvant dose dense Doxorubicin and Cyclophosphamide given on day 1 of a 14 day cycle with Neulasta given on day 2 for granulocyte support      01/09/2014 Surgery    Right breast lumpectomy: Invasive ductal carcinoma, grade 2, 1.8 cm with DCIS intermediate grade, lymphovascular invasion diffusely, 5 of 13 lymph nodes positive with extracapsular extension, ER positive, PR 80%, HER-2 negative ratio 0.96, Ki-67 11%      02/25/2014 - 02/28/2014 Hospital Admission    Left hip fracture as a result of a fall at home      05/15/2014 - 06/29/2014 Radiation Therapy    Adjuvant radiation therapy with Dr. Pablo Ledger      07/20/2014 -  Anti-estrogen oral therapy    Tamoxifen 20 mg daily       CHIEF COMPLIANT: Follow-up on tamoxifen therapy  INTERVAL HISTORY:  Whitney [name] is a 84 year old with above-mentioned history of left breast cancer currently on adjuvant tamoxifen therapy and appears to be tolerating extremely well.  She denies any hot flashes or myalgias.  She denies any other side effects from the tablet.  She denies any pain or lumps or nodules in the breast.  She is not very physically active at home because of her elderly age but she does take care of all her activities of daily living by herself.  REVIEW OF SYSTEMS:   Constitutional: Denies fevers, chills or abnormal weight loss Eyes: Denies blurriness of vision Ears, nose, mouth, throat, and face: Denies mucositis or sore throat Respiratory: Denies cough, dyspnea or wheezes Cardiovascular: Denies palpitation, chest discomfort Gastrointestinal:  Denies nausea, heartburn or change in bowel habits Skin: Denies abnormal skin rashes Lymphatics: Denies new lymphadenopathy or easy bruising Neurological:Denies numbness, tingling or new weaknesses Behavioral/Psych: Mood is stable, no new changes  Extremities: No lower extremity edema Breast:  denies any pain or lumps or nodules in either breasts All other systems were reviewed with the patient and are negative.  I have reviewed the past medical history, past surgical history, social history and family history with the patient and they are unchanged from previous note.  ALLERGIES:  is allergic to tape.  MEDICATIONS:  Current Outpatient Medications  Medication Sig Dispense Refill  . amiodarone (PACERONE) 200 MG tablet TAKE 1 TABLET BY  MOUTH EVERY DAY 30 tablet 5  . Carboxymeth-Glycerin-Polysorb (REFRESH OPTIVE ADVANCED OP) Place 1 drop into both eyes 3 (three) times daily.    Marland Kitchen doxazosin (CARDURA) 4 MG tablet TAKE 1 TABLET BY MOUTH EVERY DAY 90 tablet 2  . ELIQUIS 5 MG TABS tablet TAKE 1 TABLET TWICE A DAY 60 tablet 10  . furosemide (LASIX) 40 MG tablet Take 2 tablets (80 mg total) by mouth 2 (two) times daily. Please start to take  lasix on 8/15 60 tablet 0  . glimepiride (AMARYL) 1 MG tablet TAKE 1/2 TABLET BY MOUTH TWICE A DAY 30 tablet 3  . JANUVIA 100 MG tablet TAKE 1 TABLET BY MOUTH EVERY DAY 90 tablet 1  . lisinopril (PRINIVIL,ZESTRIL) 20 MG tablet Take 1 tablet (20 mg total) by mouth daily. 90 tablet 3  . metoprolol succinate (TOPROL-XL) 100 MG 24 hr tablet TAKE 1 TABLET BY MOUTH TWICE A DAY WITH OR IMMEDIATELY FOLLOWING A MEAL 180 tablet 1  . ONETOUCH VERIO test strip CHECK BLOOD SUGAR EVERY DAY AS DIRECTED 50 each 3  . potassium chloride SA (KLOR-CON M20) 20 MEQ tablet Take 1 tablet (20 mEq total) by mouth daily. 30 tablet 3  . Sennosides (EX-LAX) 15 MG TABS Take 1 tablet by mouth daily as needed (CONSTIPATION).    Marland Kitchen tamoxifen (NOLVADEX) 20 MG tablet Take 1 tablet (20 mg total) by mouth daily. 90 tablet 3   No current facility-administered medications for this visit.     PHYSICAL EXAMINATION: ECOG PERFORMANCE STATUS: 1 - Symptomatic but completely ambulatory  Vitals:   04/20/17 1405  BP: (!) 149/74  Pulse: (!) 57  Resp: 16  Temp: 97.8 F (36.6 C)  SpO2: 99%   Filed Weights   04/20/17 1405  Weight: 179 lb 1.6 oz (81.2 kg)    GENERAL:alert, no distress and comfortable SKIN: skin color, texture, turgor are normal, no rashes or significant lesions EYES: normal, Conjunctiva are pink and non-injected, sclera clear OROPHARYNX:no exudate, no erythema and lips, buccal mucosa, and tongue normal  NECK: supple, thyroid normal size, non-tender, without nodularity LYMPH:  no palpable lymphadenopathy in the cervical, axillary or inguinal LUNGS: clear to auscultation and percussion with normal breathing effort HEART: regular rate & rhythm and no murmurs and no lower extremity edema ABDOMEN:abdomen soft, non-tender and normal bowel sounds MUSCULOSKELETAL:no cyanosis of digits and no clubbing  NEURO: alert & oriented x 3 with fluent speech, no focal motor/sensory deficits EXTREMITIES: No lower extremity  edema BREAST: No palpable masses or nodules in either right or left breasts.  Scar tissue in the right breast related to prior surgery and radiation.  No palpable axillary supraclavicular or infraclavicular adenopathy no breast tenderness or nipple discharge. (exam performed in the presence of a chaperone)  LABORATORY DATA:  I have reviewed the data as listed CMP Latest Ref Rng & Units 12/18/2016 12/11/2016 12/07/2016  Glucose 65 - 99 mg/dL 227(H) 210(H) 167(H)  BUN 7 - 25 mg/dL 52(H) 75(H) 56(H)  Creatinine 0.60 - 0.88 mg/dL 1.93(H) 2.46(H) 2.47(H)  Sodium 135 - 146 mmol/L 140 142 139  Potassium 3.5 - 5.3 mmol/L 3.9 3.3(L) 3.1(L)  Chloride 98 - 110 mmol/L 105 101 98  CO2 20 - 32 mmol/L 28 29 30   Calcium 8.6 - 10.4 mg/dL 8.5(L) 8.7 8.7  Total Protein 6.1 - 8.1 g/dL - - -  Total Bilirubin 0.2 - 1.2 mg/dL - - -  Alkaline Phos 33 - 130 U/L - - -  AST 10 - 35  U/L - - -  ALT 6 - 29 U/L - - -    Lab Results  Component Value Date   WBC 6.8 11/04/2016   HGB 11.1 (L) 11/04/2016   HCT 33.8 (L) 11/04/2016   MCV 93.1 11/04/2016   PLT 190 11/04/2016   NEUTROABS 5,576 11/04/2016    ASSESSMENT & PLAN:  Breast cancer of upper-outer quadrant of right female breast ([place]) T1 CN2A M0 stage IIIa invasive ductal carcinoma right breast status post lumpectomy 5/13 lymph nodes positive with lymphovascular invasion and extracapsular extension. ER positive PR positive HER-2 negative Ki-67 11%. Patient was hospitalized for left hip fracture 02/25/2014, completed adjuvant radiation therapy 06/29/14, Started Tamoxifen 07/20/14 Hospitalization 12/30/2015: Escherichia colibacteremia.  Tamoxifen Toxicities:no toxicities to tamoxifen. Denies any hot flashes or myalgias.  Breast Cancer Surveillance: 1. Breast exam 04/20/2017: no lumps or nodules or any concerns for breast cancer. 2. Mammogram 11/02/2016: No breast cancer, post-op changes density B  Leg edema: Instructed the patient to elevate her legs. She is  currently on Lasix Mild chronic kidney disease: This is being monitored by her primary care physician.   RTC in one year For surveillance and follow-up.  I spent 25 minutes talking to the patient of which more than half was spent in counseling and coordination of care.  No orders of the defined types were placed in this encounter.  The patient has a good understanding of the overall plan. she agrees with it. she will call with any problems that may develop before the next visit here.   Harriette Ohara, MD 04/20/17

## 2017-05-05 ENCOUNTER — Encounter: Payer: Self-pay | Admitting: Family Medicine

## 2017-05-05 ENCOUNTER — Other Ambulatory Visit: Payer: Self-pay

## 2017-05-05 ENCOUNTER — Ambulatory Visit: Payer: Medicare Other | Admitting: Family Medicine

## 2017-05-05 VITALS — Temp 97.9°F | Resp 16 | Ht 66.5 in | Wt 176.0 lb

## 2017-05-05 DIAGNOSIS — I5032 Chronic diastolic (congestive) heart failure: Secondary | ICD-10-CM

## 2017-05-05 DIAGNOSIS — I480 Paroxysmal atrial fibrillation: Secondary | ICD-10-CM

## 2017-05-05 DIAGNOSIS — R42 Dizziness and giddiness: Secondary | ICD-10-CM

## 2017-05-05 LAB — URINALYSIS, ROUTINE W REFLEX MICROSCOPIC
Bilirubin Urine: NEGATIVE
Glucose, UA: NEGATIVE
Hgb urine dipstick: NEGATIVE
Ketones, ur: NEGATIVE
Leukocytes, UA: NEGATIVE
Nitrite: NEGATIVE
Protein, ur: NEGATIVE
Specific Gravity, Urine: 1.01 (ref 1.001–1.03)
pH: 7 (ref 5.0–8.0)

## 2017-05-05 MED ORDER — MECLIZINE HCL 12.5 MG PO TABS: 12.5000 mg | ORAL_TABLET | Freq: Every day | ORAL | 0 refills | Status: DC | PRN

## 2017-05-05 NOTE — Progress Notes (Signed)
Subjective:    Patient ID: Whitney [name], female    DOB: 04/15/1927, 84 y.o.   MRN: [id number]  Patient presents for Vertigo (reports dizziness when she gets up and is staggering)  Pt here with dizziness that started this morning.  States that she felt fine yesterday has been in her normal state of health except appetite has been down a little.  Her daughter is with her today states that yesterday she ate 2 tacos and a few gizzards with rice.  She typically drinks more soda than water but also drinks cranberry juice.  When she got up to go to the bathroom into moving to the kitchen she felt a little woozy headed.  Denies any spinning denies any nausea vomiting associated no chest pain no shortness of breath.  In the past when she has had the dizzy episodes she has had concurrent urinary tract infections that have led to urosepsis.  She states that she feels fine otherwise and when she is sitting she feels fine.  She has known history of atrial fibrillation she is on amiodarone as well as metoprolol and Eliquis.  Her heart rate is typically in the 50s and has been so for quite some time.  She has history of significant peripheral edema she is supposed to be on Lasix 40 mg twice a day however she has been taking Lasix 120 mg once a day in the morning along with her lisinopril and other medications. Diabetes mellitus daughter states that her blood sugars have been good at this morning 154 she is taking her medications as prescribed her last A1c was 6.2% 3 months ago.  Review Of Systems:  GEN- denies fatigue, fever, weight loss,weakness, recent illness HEENT- denies eye drainage, change in vision, nasal discharge, CVS- denies chest pain, palpitations RESP- denies SOB, cough, wheeze ABD- denies N/V, change in stools, abd pain GU- denies dysuria, hematuria, dribbling, incontinence MSK- denies joint pain, muscle aches, injury Neuro- denies headache, +dizziness, syncope, seizure activity        Objective:    Temp 97.9 F (36.6 C) (Oral)   Resp 16   Ht 5' 6.5" (1.689 m)   Wt 176 lb (79.8 kg)   SpO2 96%   BMI 27.98 kg/m  GEN- NAD, alert and oriented x3,well appearing, walks with cane  HEENT- PERRL, EOMI, non injected sclera, pink conjunctiva, MMM, oropharynx clear, TM clear bilat no effusion Neck- Supple, no LAD, no bruit  CVS- , no murmur   , bradycardia 50-60's  RESP-CTAB ABD-NABS,soft,NT,ND EXT- 1+ pitting edema bilat L >R compression hose Neuro-CNII-XII in tact, no nystagmus, normal speech, gait a little unsteady, walks with cane, moving all 4 ext  Pulses- Radial, 2+        Assessment & Plan:      Problem List Items Addressed This Visit      Unprioritized   Paroxysmal atrial fibrillation (HCC)   Chronic diastolic CHF (congestive heart failure), NYHA class 2 (HCC) (Chronic)    She has multiple medications that could contribute to dizziness however he did start just this morning.  I do not see any sign of infection I did check her urine just based on her history this looks clear.  My concern is that she has been taking a significant amount of diuretic first thing in the morning and not eating and drinking as well as typically.  Have her decrease the Lasix back to 40 mg twice a day.  She will continue use of her compression  hose.  I am checking her electrolytes that she does have CKD make sure she is near her baseline.  She also has history of anemia so we will check a CBC as well. In general she looks well.  Another possibility is some positional vertigo that she does not have the classic spinning symptoms though it did start this morning.  I am going to give her meclizine 12.5 mg to take today and tomorrow she still feels some of the dizziness as we adjust these other things.    Consider possible reduction in her beta-blocker though she has been stable for quite some time with her heart rate in the 50s-60s.  I may discuss with her primary care provider.  Adjusting the  Lasix may be enough to stop her symptoms.       Other Visit Diagnoses    Dizziness    -  Primary   Relevant Orders   Urinalysis, Routine w reflex microscopic (Completed)   CBC with Differential/Platelet   Basic metabolic panel      Note: This dictation was prepared with Dragon dictation along with smaller phrase technology. Any transcriptional errors that result from this process are unintentional.

## 2017-05-05 NOTE — Patient Instructions (Addendum)
Decrease lasix to 1 tablet twice a day  Meclizine 12.5mg  as needed  F/U Tuesday with Dr. Dennard Schaumann

## 2017-05-05 NOTE — Assessment & Plan Note (Signed)
She has multiple medications that could contribute to dizziness however he did start just this morning.  I do not see any sign of infection I did check her urine just based on her history this looks clear.  My concern is that she has been taking a significant amount of diuretic first thing in the morning and not eating and drinking as well as typically.  Have her decrease the Lasix back to 40 mg twice a day.  She will continue use of her compression hose.  I am checking her electrolytes that she does have CKD make sure she is near her baseline.  She also has history of anemia so we will check a CBC as well. In general she looks well.  Another possibility is some positional vertigo that she does not have the classic spinning symptoms though it did start this morning.  I am going to give her meclizine 12.5 mg to take today and tomorrow she still feels some of the dizziness as we adjust these other things.    Consider possible reduction in her beta-blocker though she has been stable for quite some time with her heart rate in the 50s-60s.  I may discuss with her primary care provider.  Adjusting the Lasix may be enough to stop her symptoms.

## 2017-05-06 LAB — CBC WITH DIFFERENTIAL/PLATELET
Basophils Absolute: 41 cells/uL (ref 0–200)
Basophils Relative: 0.7 %
Eosinophils Absolute: 58 cells/uL (ref 15–500)
Eosinophils Relative: 1 %
HCT: 33.7 % — ABNORMAL LOW (ref 35.0–45.0)
Hemoglobin: 11.1 g/dL — ABNORMAL LOW (ref 11.7–15.5)
Lymphs Abs: 638 cells/uL — ABNORMAL LOW (ref 850–3900)
MCH: 29.8 pg (ref 27.0–33.0)
MCHC: 32.9 g/dL (ref 32.0–36.0)
MCV: 90.6 fL (ref 80.0–100.0)
MPV: 12.1 fL (ref 7.5–12.5)
Monocytes Relative: 8.8 %
Neutro Abs: 4553 cells/uL (ref 1500–7800)
Neutrophils Relative %: 78.5 %
Platelets: 140 10*3/uL (ref 140–400)
RBC: 3.72 10*6/uL — ABNORMAL LOW (ref 3.80–5.10)
RDW: 14 % (ref 11.0–15.0)
Total Lymphocyte: 11 %
WBC mixed population: 510 cells/uL (ref 200–950)
WBC: 5.8 10*3/uL (ref 3.8–10.8)

## 2017-05-06 LAB — BASIC METABOLIC PANEL
BUN/Creatinine Ratio: 25 (calc) — ABNORMAL HIGH (ref 6–22)
BUN: 34 mg/dL — ABNORMAL HIGH (ref 7–25)
CO2: 27 mmol/L (ref 20–32)
Calcium: 8.8 mg/dL (ref 8.6–10.4)
Chloride: 104 mmol/L (ref 98–110)
Creat: 1.38 mg/dL — ABNORMAL HIGH (ref 0.60–0.88)
Glucose, Bld: 169 mg/dL — ABNORMAL HIGH (ref 65–99)
Potassium: 3.5 mmol/L (ref 3.5–5.3)
Sodium: 141 mmol/L (ref 135–146)

## 2017-05-11 ENCOUNTER — Encounter: Payer: Self-pay | Admitting: Family Medicine

## 2017-05-11 ENCOUNTER — Ambulatory Visit: Payer: Medicare Other | Admitting: Family Medicine

## 2017-05-11 VITALS — BP 118/60 | HR 54 | Temp 97.6°F | Resp 14 | Ht 66.5 in | Wt 182.0 lb

## 2017-05-11 DIAGNOSIS — G8929 Other chronic pain: Secondary | ICD-10-CM | POA: Diagnosis not present

## 2017-05-11 DIAGNOSIS — I5032 Chronic diastolic (congestive) heart failure: Secondary | ICD-10-CM | POA: Diagnosis not present

## 2017-05-11 DIAGNOSIS — M25561 Pain in right knee: Secondary | ICD-10-CM | POA: Diagnosis not present

## 2017-05-11 DIAGNOSIS — I480 Paroxysmal atrial fibrillation: Secondary | ICD-10-CM | POA: Diagnosis not present

## 2017-05-11 DIAGNOSIS — R42 Dizziness and giddiness: Secondary | ICD-10-CM | POA: Diagnosis not present

## 2017-05-11 NOTE — Progress Notes (Signed)
Subjective:    Patient ID: Whitney [name], female    DOB: May 29, 1927, 84 y.o.   MRN: [id number]  HPI  Patient recently saw my partner for dizziness and was diagnosed with vertigo.  She took 1 dose of meclizine and the vertigo resolved and has not returned.  My partner also reduce the dose of Lasix due to possible dehydration causing dizziness and attained a BMP which showed patient's creatinine was at her baseline renal function.  She is here today for recheck.  She remains bradycardic at 54 bpm.  She does complain of lightheadedness at times and fatigue and also orthostatic dizziness.  However she is adamant that her vertigo is better.  On the reduced dose of Lasix, she has not experienced any swelling.  Her weight is listed below: Wt Readings from Last 3 Encounters:  05/11/17 182 lb (82.6 kg)  05/05/17 176 lb (79.8 kg)  04/20/17 179 lb 1.6 oz (81.2 kg)   She has gained approximately 6 pounds since her last visit and 3 pounds overall in the last month.  However there is no evidence of fluid overload on today's exam.  She does complain of pain in her right knee.  Previously I have performed a cortisone injection which gave her modest benefit however she is interested in meeting with an orthopedic surgeon for a second opinion.  She has never tried Visco supplementation and she would be willing to pursue this for pain relief in her right knee.  She is obviously a poor surgical candidate. Past Medical History:  Diagnosis Date  . Anemia   . Atrial fibrillation ([place])   . Breast cancer ([place]) 10/2013   right upper outer  . Cancer ([place])    right breast  . Complication of anesthesia    slow to wake up  . Diabetes mellitus without complication ([place])   . Dysrhythmia 10/15   AF  . Former smoker   . Full dentures   . Hyperlipidemia   . Hypertension   . Multinodular goiter   . Radiation    Right Breast  . Thyroid disease    hypothyroidism  . Wears glasses    Past Surgical History:    Procedure Laterality Date  . ABDOMINAL HYSTERECTOMY    . AXILLARY LYMPH NODE DISSECTION Right 01/09/2014   Procedure: RIGHT AXILLARY LYMPH NODE DISECTION;  Surgeon: [name], MD;  Location: [facility];  Service: General;  Laterality: Right;  . BREAST LUMPECTOMY WITH RADIOACTIVE SEED LOCALIZATION Right 01/09/2014   Procedure: RIGHT BREAST SEED LOCALIZED LUMPECTOMY ;  Surgeon: [name], MD;  Location: [facility];  Service: General;  Laterality: Right;  . EYE SURGERY  12/22/2009   cataracts  . INTRAMEDULLARY (IM) NAIL INTERTROCHANTERIC Left 02/24/2014   Procedure: IM ROD LEFT HIP FX;  Surgeon: Alta Corning, MD;  Location: [facility];  Service: Orthopedics;  Laterality: Left;  . PORT-A-CATH REMOVAL Right 01/09/2014   Procedure: REMOVAL PORT-A-CATH;  Surgeon: [name], MD;  Location: [facility];  Service: General;  Laterality: Right;  . PORTACATH PLACEMENT N/A 11/15/2013   Procedure: INSERTION PORT-A-CATH WITH ULTRA SOUND AND FLOROSCOPY;  Surgeon: [name], MD;  Location: [facility];  Service: General;  Laterality: N/A;  . TOTAL KNEE ARTHROPLASTY  2002   left   Current Outpatient Medications on File Prior to Visit  Medication Sig Dispense Refill  . amiodarone (PACERONE) 200 MG tablet TAKE 1 TABLET BY MOUTH EVERY DAY 30 tablet 5  .  Carboxymeth-Glycerin-Polysorb (REFRESH OPTIVE ADVANCED OP) Place 1 drop into both eyes 3 (three) times daily.    Marland Kitchen doxazosin (CARDURA) 4 MG tablet TAKE 1 TABLET BY MOUTH EVERY DAY 90 tablet 2  . ELIQUIS 5 MG TABS tablet TAKE 1 TABLET TWICE A DAY 60 tablet 10  . furosemide (LASIX) 40 MG tablet Take 2 tablets (80 mg total) by mouth 2 (two) times daily. Please start to take lasix on 8/15 60 tablet 0  . glimepiride (AMARYL) 1 MG tablet TAKE 1/2 TABLET BY MOUTH TWICE A DAY 30 tablet 3  . JANUVIA 100 MG tablet TAKE 1 TABLET BY MOUTH EVERY DAY 90 tablet 1  . lisinopril (PRINIVIL,ZESTRIL) 20 MG tablet Take 1 tablet  (20 mg total) by mouth daily. 90 tablet 3  . meclizine (ANTIVERT) 12.5 MG tablet Take 1 tablet (12.5 mg total) by mouth daily as needed for dizziness. 10 tablet 0  . metoprolol succinate (TOPROL-XL) 100 MG 24 hr tablet TAKE 1 TABLET BY MOUTH TWICE A DAY WITH OR IMMEDIATELY FOLLOWING A MEAL 180 tablet 1  . ONETOUCH VERIO test strip CHECK BLOOD SUGAR EVERY DAY AS DIRECTED 50 each 3  . potassium chloride SA (KLOR-CON M20) 20 MEQ tablet Take 1 tablet (20 mEq total) by mouth daily. 30 tablet 3  . Sennosides (EX-LAX) 15 MG TABS Take 1 tablet by mouth daily as needed (CONSTIPATION).    Marland Kitchen tamoxifen (NOLVADEX) 20 MG tablet Take 1 tablet (20 mg total) by mouth daily. 90 tablet 3   No current facility-administered medications on file prior to visit.    Allergies  Allergen Reactions  . Tape Other (See Comments)    Skin is somewhat sensitive   Social History   Socioeconomic History  . Marital status: Widowed    Spouse name: Not on file  . Number of children: 4  . Years of education: Not on file  . Highest education level: Not on file  Social Needs  . Financial resource strain: Not on file  . Food insecurity - worry: Not on file  . Food insecurity - inability: Not on file  . Transportation needs - medical: Not on file  . Transportation needs - non-medical: Not on file  Occupational History  . Not on file  Tobacco Use  . Smoking status: Former Smoker    Packs/day: 1.00    Years: 5.00    Pack years: 5.00    Last attempt to quit: 11/14/1958    Years since quitting: 58.5  . Smokeless tobacco: Never Used  Substance and Sexual Activity  . Alcohol use: No    Alcohol/week: 0.0 oz  . Drug use: No  . Sexual activity: No    Comment: menarche age 84, P71, first birth age 52, no HRT, menopause age 20  Other Topics Concern  . Not on file  Social History Narrative  . Not on file     Review of Systems  All other systems reviewed and are negative.      Objective:   Physical Exam   Cardiovascular: Normal rate, regular rhythm and normal heart sounds.  Pulmonary/Chest: Effort normal and breath sounds normal. No respiratory distress. She has no wheezes. She has no rales.  Abdominal: Soft. Bowel sounds are normal. She exhibits no distension. There is no tenderness. There is no rebound.  Musculoskeletal: She exhibits no edema.       Right knee: She exhibits decreased range of motion and swelling. She exhibits no effusion. Tenderness found. Medial joint line and  lateral joint line tenderness noted.  Vitals reviewed.         Assessment & Plan:  Dizziness  Chronic diastolic CHF (congestive heart failure), NYHA class 2 (HCC)  Paroxysmal atrial fibrillation (HCC)  Chronic pain of right knee - Plan: Ambulatory referral to Orthopedic Surgery  Patient's dizziness seems to be due to vertigo and has since resolved.  Recommended discontinuation of meclizine.  Patient is on metoprolol for atrial fibrillation however her heart rate shows bradycardia with possible symptomatic bradycardia.  Therefore I recommended decreasing her dose of Toprol from 200 mg a day to 100 mg a day.  However I will continue the patient on the reduced dose of Lasix we will need to monitor her weight closely for sign of fluid overload.  I will consult orthopedic surgery to discuss possible Visco supplementation for her chronic knee pain.  Meanwhile we decided to perform a cortisone injection today given the fact he gave her modest benefit in the past.  Using sterile technique, I injected the right knee with 2 cc of lidocaine, 2 cc of Marcaine, and 2 cc of 40 mg/mL Kenalog.  The patient tolerated the procedure well without complication

## 2017-05-25 ENCOUNTER — Ambulatory Visit (INDEPENDENT_AMBULATORY_CARE_PROVIDER_SITE_OTHER): Payer: Medicare Other | Admitting: Orthopaedic Surgery

## 2017-05-25 ENCOUNTER — Ambulatory Visit (INDEPENDENT_AMBULATORY_CARE_PROVIDER_SITE_OTHER): Payer: Medicare Other

## 2017-05-25 ENCOUNTER — Encounter (INDEPENDENT_AMBULATORY_CARE_PROVIDER_SITE_OTHER): Payer: Self-pay | Admitting: Orthopaedic Surgery

## 2017-05-25 ENCOUNTER — Telehealth (INDEPENDENT_AMBULATORY_CARE_PROVIDER_SITE_OTHER): Payer: Self-pay

## 2017-05-25 DIAGNOSIS — G8929 Other chronic pain: Secondary | ICD-10-CM

## 2017-05-25 DIAGNOSIS — M25561 Pain in right knee: Secondary | ICD-10-CM | POA: Diagnosis not present

## 2017-05-25 NOTE — Telephone Encounter (Signed)
Please submit application for gel injection for right knee per Mendel Ryder.  Thank you.
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
## 2017-05-25 NOTE — Telephone Encounter (Signed)
Submitted application online for Monovisc, right knee. 

## 2017-05-31 ENCOUNTER — Ambulatory Visit: Payer: Medicare Other | Admitting: Endocrinology

## 2017-05-31 ENCOUNTER — Other Ambulatory Visit: Payer: Self-pay | Admitting: Endocrinology

## 2017-06-08 ENCOUNTER — Telehealth (INDEPENDENT_AMBULATORY_CARE_PROVIDER_SITE_OTHER): Payer: Self-pay

## 2017-06-08 NOTE — Telephone Encounter (Signed)
Talked with patient concernin Monovisc Inj., right knee.

## 2017-06-08 NOTE — Telephone Encounter (Signed)
Patient's daughter left a message inquiring about the status of her mother's gel injections.  Patient's CB#[phone].  Thank you.

## 2017-06-08 NOTE — Telephone Encounter (Signed)
Talked with patient and advised her that a PA is needed for Monovisc Inj. Right knee.  Will call patient once this has been approved.

## 2017-06-11 ENCOUNTER — Telehealth (INDEPENDENT_AMBULATORY_CARE_PROVIDER_SITE_OTHER): Payer: Self-pay

## 2017-06-11 NOTE — Telephone Encounter (Signed)
Initiated PA for Monovisc, right knee.  Pending Authorization# [id number]

## 2017-06-15 ENCOUNTER — Telehealth (INDEPENDENT_AMBULATORY_CARE_PROVIDER_SITE_OTHER): Payer: Self-pay

## 2017-06-15 ENCOUNTER — Other Ambulatory Visit: Payer: Self-pay | Admitting: Family Medicine

## 2017-06-15 NOTE — Telephone Encounter (Signed)
Received VM from [facility] with [facility] advising that patient has been approved for Monovisc Inj., Right Knee.  Approval# [id number].  Talked with patient's granddaughter Valinda Party and advised her of VM per Randall Hiss with [facility].  Patient appt.is scheduled for Monday, 06/21/17 with Dr. Erlinda Hong.

## 2017-06-20 ENCOUNTER — Other Ambulatory Visit: Payer: Self-pay | Admitting: Family Medicine

## 2017-06-21 ENCOUNTER — Encounter (INDEPENDENT_AMBULATORY_CARE_PROVIDER_SITE_OTHER): Payer: Self-pay | Admitting: Orthopaedic Surgery

## 2017-06-21 ENCOUNTER — Ambulatory Visit (INDEPENDENT_AMBULATORY_CARE_PROVIDER_SITE_OTHER): Payer: Medicare Other | Admitting: Orthopaedic Surgery

## 2017-06-21 DIAGNOSIS — M1711 Unilateral primary osteoarthritis, right knee: Secondary | ICD-10-CM | POA: Diagnosis not present

## 2017-06-21 MED ORDER — HYALURONAN 88 MG/4ML IX SOSY
88.0000 mg | PREFILLED_SYRINGE | INTRA_ARTICULAR | Status: AC | PRN
Start: 2017-06-21 — End: 2017-06-21
  Administered 2017-06-21: 88 mg via INTRA_ARTICULAR

## 2017-06-21 NOTE — Progress Notes (Signed)
   Procedure Note  Patient: Whitney [name]             Date of Birth: Dec 31, 1927           MRN: [id number]             Visit Date: 06/21/2017  Procedures: Visit Diagnoses: Unilateral primary osteoarthritis, right knee  Large Joint Inj: R knee on 06/21/2017 9:44 AM Indications: pain Details: 22 G needle  Arthrogram: No  Medications: 88 mg Hyaluronan 88 MG/4ML Outcome: tolerated well, no immediate complications Patient was prepped and draped in the usual sterile fashion.

## 2017-06-29 ENCOUNTER — Encounter: Payer: Self-pay | Admitting: Endocrinology

## 2017-06-29 ENCOUNTER — Ambulatory Visit: Payer: Medicare Other | Admitting: Endocrinology

## 2017-06-29 VITALS — BP 138/78 | HR 55 | Ht 66.5 in | Wt 178.0 lb

## 2017-06-29 DIAGNOSIS — E042 Nontoxic multinodular goiter: Secondary | ICD-10-CM | POA: Diagnosis not present

## 2017-06-29 DIAGNOSIS — E1165 Type 2 diabetes mellitus with hyperglycemia: Secondary | ICD-10-CM | POA: Diagnosis not present

## 2017-06-29 LAB — COMPREHENSIVE METABOLIC PANEL
ALT: 33 U/L (ref 0–35)
AST: 31 U/L (ref 0–37)
Albumin: 3.4 g/dL — ABNORMAL LOW (ref 3.5–5.2)
Alkaline Phosphatase: 43 U/L (ref 39–117)
BUN: 30 mg/dL — ABNORMAL HIGH (ref 6–23)
CO2: 28 mEq/L (ref 19–32)
Calcium: 8.9 mg/dL (ref 8.4–10.5)
Chloride: 103 mEq/L (ref 96–112)
Creatinine, Ser: 1.47 mg/dL — ABNORMAL HIGH (ref 0.40–1.20)
GFR: 43.2 mL/min — ABNORMAL LOW (ref >60.00)
Glucose, Bld: 153 mg/dL — ABNORMAL HIGH (ref 70–99)
Potassium: 3.7 mEq/L (ref 3.5–5.1)
Sodium: 138 mEq/L (ref 135–145)
Total Bilirubin: 0.6 mg/dL (ref 0.2–1.2)
Total Protein: 6.3 g/dL (ref 6.0–8.3)

## 2017-06-29 LAB — LIPID PANEL
Cholesterol: 198 mg/dL (ref 0–200)
HDL: 70.8 mg/dL (ref >39.00)
LDL Cholesterol: 115 mg/dL — ABNORMAL HIGH (ref 0–99)
NonHDL: 127.31
Total CHOL/HDL Ratio: 3
Triglycerides: 62 mg/dL (ref 0.0–149.0)
VLDL: 12.4 mg/dL (ref 0.0–40.0)

## 2017-06-29 LAB — POCT GLYCOSYLATED HEMOGLOBIN (HGB A1C): Hemoglobin A1C: 6.3

## 2017-06-29 LAB — MICROALBUMIN / CREATININE URINE RATIO
Creatinine,U: 49.8 mg/dL
Microalb Creat Ratio: 10.6 mg/g (ref 0.0–30.0)
Microalb, Ur: 5.3 mg/dL — ABNORMAL HIGH (ref 0.0–1.9)

## 2017-06-29 LAB — T3, FREE: T3, Free: 2 pg/mL — ABNORMAL LOW (ref 2.3–4.2)

## 2017-06-29 LAB — TSH: TSH: 0.54 u[IU]/mL (ref 0.35–4.50)

## 2017-06-29 LAB — T4, FREE: Free T4: 1.17 ng/dL (ref 0.60–1.60)

## 2017-06-29 NOTE — Progress Notes (Signed)
Patient ID: Whitney [name], female   DOB: 1927-06-17, 84 y.o.   MRN: [id number]   Reason for Appointment: Diabetes and other problems  History of Present Illness     Type 2 DIABETES MELITUS, date of diagnosis:  1983     Previous history: She had been taking metformin and Actos for several years.  She usually has had excellent control with upper normal A1c  Recent history:   Oral hypoglycemic drugs: Januvia 50 mg daily    She was previously on  Metformin and Actos; because of edema she was told to stop Actos Her metformin was stopped because of renal dysfunction and Amaryl because of hypoglycemia  Her A1c is excellent at 6.3 and very stable  Current blood sugar patterns, management details:  As before she checks blood sugars 1-2 hours after her evening meal and not any other  Although Amaryl was on her list of medications she has not been taking this  She has lost weight since her last visit in November  She has difficulty with moving around and does not do any exercise  However she thinks she is trying to stay away from a lot of fast food and sweets, avoiding regular soft drinks also        Monitors blood glucose:   less than once a day .    Glucometer: One Touch.           Overall MEDIAN blood sugar at night 167 with a range of 124-258  Physical activity: exercise: Minimal                 Wt Readings from Last 3 Encounters:  06/29/17 178 lb (80.7 kg)  05/11/17 182 lb (82.6 kg)  05/05/17 176 lb (79.8 kg)    LABS:  Lab Results  Component Value Date   HGBA1C 6.3 06/29/2017   HGBA1C 6.2 01/29/2017   HGBA1C 6.1 (H) 10/25/2016   Lab Results  Component Value Date   MICROALBUR 6.7 (H) 09/16/2015   LDLCALC 85 09/16/2015   CREATININE 1.38 (H) 05/05/2017     OTHER problems  are discussed in review of systems      Allergies as of 06/29/2017      Reactions   Tape Other (See Comments)   Skin is somewhat sensitive      Medication List       Accurate as of 06/29/17  9:42 AM. Always use your most recent med list.          amiodarone 200 MG tablet Commonly known as:  PACERONE TAKE 1 TABLET BY MOUTH EVERY DAY   doxazosin 4 MG tablet Commonly known as:  CARDURA TAKE 1 TABLET BY MOUTH EVERY DAY   ELIQUIS 5 MG Tabs tablet Generic drug:  apixaban TAKE 1 TABLET TWICE A DAY   EX-LAX 15 MG Tabs Generic drug:  Sennosides Take 1 tablet by mouth daily as needed (CONSTIPATION).   furosemide 40 MG tablet Commonly known as:  LASIX Take 2 tablets (80 mg total) by mouth 2 (two) times daily. Please start to take lasix on 8/15   furosemide 40 MG tablet Commonly known as:  LASIX TAKE 2 TABLETS BY MOUTH TWICE A DAY   JANUVIA 100 MG tablet Generic drug:  sitaGLIPtin TAKE 1 TABLET BY MOUTH EVERY DAY   KLOR-CON M20 20 MEQ tablet Generic drug:  potassium chloride SA TAKE 1 TABLET BY MOUTH EVERY DAY   lisinopril 20 MG tablet Commonly known as:  PRINIVIL,ZESTRIL Take  1 tablet (20 mg total) by mouth daily.   meclizine 12.5 MG tablet Commonly known as:  ANTIVERT Take 1 tablet (12.5 mg total) by mouth daily as needed for dizziness.   metoprolol succinate 100 MG 24 hr tablet Commonly known as:  TOPROL-XL TAKE 1 TABLET BY MOUTH TWICE A DAY WITH OR IMMEDIATELY FOLLOWING A MEAL   ONETOUCH VERIO test strip Generic drug:  glucose blood CHECK BLOOD SUGAR EVERY DAY AS DIRECTED   REFRESH OPTIVE ADVANCED OP Place 1 drop into both eyes 3 (three) times daily.   tamoxifen 20 MG tablet Commonly known as:  NOLVADEX Take 1 tablet (20 mg total) by mouth daily.       Allergies:  Allergies  Allergen Reactions  . Tape Other (See Comments)    Skin is somewhat sensitive    Past Medical History:  Diagnosis Date  . Anemia   . Atrial fibrillation ([place])   . Breast cancer ([place]) 10/2013   right upper outer  . Cancer ([place])    right breast  . Complication of anesthesia    slow to wake up  . Diabetes mellitus without complication  ([place])   . Dysrhythmia 10/15   AF  . Former smoker   . Full dentures   . Hyperlipidemia   . Hypertension   . Multinodular goiter   . Radiation    Right Breast  . Thyroid disease    hypothyroidism  . Wears glasses     Past Surgical History:  Procedure Laterality Date  . ABDOMINAL HYSTERECTOMY    . AXILLARY LYMPH NODE DISSECTION Right 01/09/2014   Procedure: RIGHT AXILLARY LYMPH NODE DISECTION;  Surgeon: [name], MD;  Location: [facility];  Service: General;  Laterality: Right;  . BREAST LUMPECTOMY WITH RADIOACTIVE SEED LOCALIZATION Right 01/09/2014   Procedure: RIGHT BREAST SEED LOCALIZED LUMPECTOMY ;  Surgeon: [name], MD;  Location: [facility];  Service: General;  Laterality: Right;  . EYE SURGERY  12/22/2009   cataracts  . INTRAMEDULLARY (IM) NAIL INTERTROCHANTERIC Left 02/24/2014   Procedure: IM ROD LEFT HIP FX;  Surgeon: Alta Corning, MD;  Location: [facility];  Service: Orthopedics;  Laterality: Left;  . PORT-A-CATH REMOVAL Right 01/09/2014   Procedure: REMOVAL PORT-A-CATH;  Surgeon: [name], MD;  Location: [facility];  Service: General;  Laterality: Right;  . PORTACATH PLACEMENT N/A 11/15/2013   Procedure: INSERTION PORT-A-CATH WITH ULTRA SOUND AND FLOROSCOPY;  Surgeon: [name], MD;  Location: [facility];  Service: General;  Laterality: N/A;  . TOTAL KNEE ARTHROPLASTY  2002   left    History reviewed. No pertinent family history.  Social History:  reports that she quit smoking about 58 years ago. She has a 5.00 pack-year smoking history. She has never used smokeless tobacco. She reports that she does not drink alcohol or use drugs.  Review of Systems:   Hypertension:  She is taking lisinopril 20 mg daily, 4 mg doxazosin, 100 mg of metoprolol   Does not monitor BP at home  EDEMA She is taking Lasix 80 mg daily for edema, prescribed by PCP Also she is wearing elastic stockings Edema is only  mild   Renal function: Creatinine has been variable, followed by PCP   Lab Results  Component Value Date   CREATININE 1.38 (H) 05/05/2017   BUN 34 (H) 05/05/2017   NA 141 05/05/2017   K 3.5 05/05/2017   CL 104 05/05/2017   CO2 27 05/05/2017  Lipids: Has been on long-term Lipitor with good control but this is not on her list currently, has not had follow-up for this     Lab Results  Component Value Date   CHOL 162 09/16/2015   HDL 66.80 09/16/2015   LDLCALC 85 09/16/2015   TRIG 48.0 09/16/2015   CHOLHDL 2 09/16/2015    She has had vitamin D deficiency, Treated with 2000 units vitamin D 3  HYPOTHYROIDISM: She has had a multinodular goiter for several years  Levothyroxine supplements were stopped in 5/16 and she has continued to be euthyroid  Her amiodarone has been apparently continued recently   Lab Results  Component Value Date   TSH 0.25 (L) 10/30/2016   TSH 0.53 06/24/2016   TSH 0.54 03/26/2016   FREET4 1.50 10/30/2016   FREET4 1.27 06/24/2016   FREET4 1.26 03/19/2015    Last foot exam in 7/17   ADRENAL mass:  She had a CT scan done previously and was found to have a left adrenal mass  She was evaluated previously by a PET scan after  diagnosis of breast cancer in 2015 She was found to have a 3.7 cm hypermetabolic left adrenal mass On a previous CT scan in 2011 she also had a 3.8 cm adrenal mass Cortisol level is normal in 2015 and 2016 No history of hypokalemia or severe hypertension/palpitations, headaches, flushing or significant fluctuations in blood pressure Had evaluation with metanephrines and overnight dexamethasone test indicating normal adrenal function   Examination:   BP 138/78 (BP Location: Left Arm, Patient Position: Sitting, Cuff Size: Normal)   Pulse (!) 55   Ht 5' 6.5" (1.689 m)   Wt 178 lb (80.7 kg)   SpO2 98%   BMI 28.30 kg/m   Body mass index is 28.3 kg/m.    She has elastic stockings and feet were not examined Appears  to have significant puffiness of her ankles Thyroid is enlarged about 1-1/2-2 times normal and nodule on the right and firm and just palpable on the left  ASSESSMENT/ PLAN:  Diabetes type 2   See history of present illness for  discussion of current diabetes management, blood sugar patterns and problems identified  Her A1c is stable at 6.3 This is with taking only 50 mg Januvia  Although her home readings are averaging about 165 after evening meal there are variable She does not check readings after breakfast lunch or before breakfast  For now she can continue Januvia 50 mg alone, this is likely appropriate for her age and renal function   GOITER: Her last TSH was slightly low and will need follow-up today  LIPIDS: Needs follow-up levels, to restart Lipitor if she is not taking this   Elayne Snare 06/29/2017, 9:42 AM    Note: This office note was prepared with Dragon voice recognition system technology. Any transcriptional errors that result from this process are unintentional.

## 2017-07-02 NOTE — Progress Notes (Signed)
Please call to let her granddaughter know that the lab results are stable and forwarded to PCP.  Cholesterol high but will defer treatment to PCP

## 2017-07-14 ENCOUNTER — Other Ambulatory Visit: Payer: Self-pay | Admitting: Family Medicine

## 2017-07-14 MED ORDER — ATORVASTATIN CALCIUM 20 MG PO TABS: 20.0000 mg | ORAL_TABLET | Freq: Every day | ORAL | 3 refills | Status: DC

## 2017-07-30 DIAGNOSIS — Z9849 Cataract extraction status, unspecified eye: Secondary | ICD-10-CM | POA: Diagnosis not present

## 2017-07-30 DIAGNOSIS — E119 Type 2 diabetes mellitus without complications: Secondary | ICD-10-CM | POA: Diagnosis not present

## 2017-07-30 DIAGNOSIS — H43813 Vitreous degeneration, bilateral: Secondary | ICD-10-CM | POA: Diagnosis not present

## 2017-07-30 DIAGNOSIS — Z961 Presence of intraocular lens: Secondary | ICD-10-CM | POA: Diagnosis not present

## 2017-08-28 ENCOUNTER — Other Ambulatory Visit: Payer: Self-pay | Admitting: Endocrinology

## 2017-09-02 ENCOUNTER — Other Ambulatory Visit: Payer: Self-pay | Admitting: Endocrinology

## 2017-09-05 ENCOUNTER — Other Ambulatory Visit: Payer: Self-pay | Admitting: Family Medicine

## 2017-09-14 ENCOUNTER — Other Ambulatory Visit: Payer: Self-pay | Admitting: Family Medicine

## 2017-09-20 ENCOUNTER — Other Ambulatory Visit: Payer: Self-pay | Admitting: Endocrinology

## 2017-10-04 ENCOUNTER — Other Ambulatory Visit: Payer: Self-pay | Admitting: Hematology and Oncology

## 2017-10-04 DIAGNOSIS — Z853 Personal history of malignant neoplasm of breast: Secondary | ICD-10-CM

## 2017-10-26 ENCOUNTER — Ambulatory Visit (INDEPENDENT_AMBULATORY_CARE_PROVIDER_SITE_OTHER): Payer: Medicare Other | Admitting: Orthopaedic Surgery

## 2017-10-29 ENCOUNTER — Encounter: Payer: Self-pay | Admitting: Endocrinology

## 2017-10-29 ENCOUNTER — Ambulatory Visit: Payer: Medicare Other | Admitting: Endocrinology

## 2017-10-29 VITALS — BP 128/78 | HR 84 | Ht 67.0 in | Wt 176.0 lb

## 2017-10-29 DIAGNOSIS — N183 Chronic kidney disease, stage 3 unspecified: Secondary | ICD-10-CM

## 2017-10-29 DIAGNOSIS — E1165 Type 2 diabetes mellitus with hyperglycemia: Secondary | ICD-10-CM | POA: Diagnosis not present

## 2017-10-29 DIAGNOSIS — E7800 Pure hypercholesterolemia, unspecified: Secondary | ICD-10-CM

## 2017-10-29 DIAGNOSIS — E042 Nontoxic multinodular goiter: Secondary | ICD-10-CM | POA: Diagnosis not present

## 2017-10-29 LAB — LIPID PANEL
Cholesterol: 137 mg/dL (ref 0–200)
HDL: 59.5 mg/dL (ref >39.00)
LDL Cholesterol: 65 mg/dL (ref 0–99)
NonHDL: 77.57
Total CHOL/HDL Ratio: 2
Triglycerides: 65 mg/dL (ref 0.0–149.0)
VLDL: 13 mg/dL (ref 0.0–40.0)

## 2017-10-29 LAB — COMPREHENSIVE METABOLIC PANEL
ALT: 22 U/L (ref 0–35)
AST: 24 U/L (ref 0–37)
Albumin: 3.4 g/dL — ABNORMAL LOW (ref 3.5–5.2)
Alkaline Phosphatase: 42 U/L (ref 39–117)
BUN: 26 mg/dL — ABNORMAL HIGH (ref 6–23)
CO2: 31 mEq/L (ref 19–32)
Calcium: 8.9 mg/dL (ref 8.4–10.5)
Chloride: 108 mEq/L (ref 96–112)
Creatinine, Ser: 1.43 mg/dL — ABNORMAL HIGH (ref 0.40–1.20)
GFR: 44.57 mL/min — ABNORMAL LOW (ref >60.00)
Glucose, Bld: 162 mg/dL — ABNORMAL HIGH (ref 70–99)
Potassium: 3.7 mEq/L (ref 3.5–5.1)
Sodium: 144 mEq/L (ref 135–145)
Total Bilirubin: 0.7 mg/dL (ref 0.2–1.2)
Total Protein: 6.3 g/dL (ref 6.0–8.3)

## 2017-10-29 LAB — POCT GLYCOSYLATED HEMOGLOBIN (HGB A1C): Hemoglobin A1C: 5.9 % — AB (ref 4.0–5.6)

## 2017-10-29 LAB — TSH: TSH: 0.68 u[IU]/mL (ref 0.35–4.50)

## 2017-10-29 LAB — T4, FREE: Free T4: 1.47 ng/dL (ref 0.60–1.60)

## 2017-10-29 NOTE — Progress Notes (Signed)
Patient ID: Whitney [name], female   DOB: 06/18/1927, 84 y.o.   MRN: [id number]   Reason for Appointment: Diabetes and other problems  History of Present Illness     Type 2 DIABETES MELITUS, date of diagnosis:  1983     Previous history: She had been taking metformin and Actos for several years.  She usually has had excellent control with upper normal A1c  Recent history:   Oral hypoglycemic drugs: Januvia 100 mg daily    She was previously on  Metformin and Actos; because of edema she was told to stop Actos Her metformin was stopped because of renal dysfunction and Amaryl because of hypoglycemia  Her A1c is excellent at 5.9, previously was 6.3 and very stable  Current blood sugar patterns, management details:  Although she was on 50 mg of Januvia because of renal dysfunction she appears to have a prescription of 100 mg daily from her PCP now  She is not checking her sugars much in the mornings or after breakfast or lunch and randomly in evening before or after supper  Most of her blood sugars are excellent with only rare blood sugars over 160-179 she has gradually lost a little weight this year  Her family thinks that she is usually watching her portions  Not drinking regular soft drinks  Lowest blood sugar was 112        Monitors blood glucose:   less than once a day .    Glucometer: One Touch.           Overall MEDIAN blood sugar is 144, range 112-196 Most readings done between 4-6 PM   Physical activity: exercise: Minimal         Wt Readings from Last 3 Encounters:  10/29/17 176 lb (79.8 kg)  06/29/17 178 lb (80.7 kg)  05/11/17 182 lb (82.6 kg)    LABS:  Lab Results  Component Value Date   HGBA1C 5.9 (A) 10/29/2017   HGBA1C 6.3 06/29/2017   HGBA1C 6.2 01/29/2017   Lab Results  Component Value Date   MICROALBUR 5.3 (H) 06/29/2017   LDLCALC 115 (H) 06/29/2017   CREATININE 1.47 (H) 06/29/2017     OTHER problems  are discussed in  review of systems      Allergies as of 10/29/2017      Reactions   Tape Other (See Comments)   Skin is somewhat sensitive      Medication List        Accurate as of 10/29/17  9:49 AM. Always use your most recent med list.          amiodarone 200 MG tablet Commonly known as:  PACERONE TAKE 1 TABLET BY MOUTH EVERY DAY   atorvastatin 20 MG tablet Commonly known as:  LIPITOR Take 1 tablet (20 mg total) by mouth daily.   doxazosin 4 MG tablet Commonly known as:  CARDURA TAKE 1 TABLET BY MOUTH EVERY DAY   ELIQUIS 5 MG Tabs tablet Generic drug:  apixaban TAKE 1 TABLET TWICE A DAY   EX-LAX 15 MG Tabs Generic drug:  Sennosides Take 1 tablet by mouth daily as needed (CONSTIPATION).   furosemide 40 MG tablet Commonly known as:  LASIX Take 2 tablets (80 mg total) by mouth 2 (two) times daily. Please start to take lasix on 8/15   furosemide 40 MG tablet Commonly known as:  LASIX TAKE 2 TABLETS BY MOUTH TWICE A DAY   JANUVIA 100 MG tablet Generic drug:  sitaGLIPtin TAKE 1 TABLET BY MOUTH EVERY DAY   KLOR-CON M20 20 MEQ tablet Generic drug:  potassium chloride SA TAKE 1 TABLET BY MOUTH EVERY DAY   lisinopril 20 MG tablet Commonly known as:  PRINIVIL,ZESTRIL Take 1 tablet (20 mg total) by mouth daily.   meclizine 12.5 MG tablet Commonly known as:  ANTIVERT Take 1 tablet (12.5 mg total) by mouth daily as needed for dizziness.   metoprolol succinate 100 MG 24 hr tablet Commonly known as:  TOPROL-XL TAKE 1 TABLET BY MOUTH TWICE A DAY WITH OR IMMEDIATELY FOLLOWING A MEAL   ONETOUCH VERIO test strip Generic drug:  glucose blood CHECK BLOOD SUGAR EVERY DAY AS DIRECTED   REFRESH OPTIVE ADVANCED OP Place 1 drop into both eyes 3 (three) times daily.   tamoxifen 20 MG tablet Commonly known as:  NOLVADEX Take 1 tablet (20 mg total) by mouth daily.       Allergies:  Allergies  Allergen Reactions  . Tape Other (See Comments)    Skin is somewhat sensitive     Past Medical History:  Diagnosis Date  . Anemia   . Atrial fibrillation ([place])   . Breast cancer ([place]) 10/2013   right upper outer  . Cancer ([place])    right breast  . Complication of anesthesia    slow to wake up  . Diabetes mellitus without complication ([place])   . Dysrhythmia 10/15   AF  . Former smoker   . Full dentures   . Hyperlipidemia   . Hypertension   . Multinodular goiter   . Radiation    Right Breast  . Thyroid disease    hypothyroidism  . Wears glasses     Past Surgical History:  Procedure Laterality Date  . ABDOMINAL HYSTERECTOMY    . AXILLARY LYMPH NODE DISSECTION Right 01/09/2014   Procedure: RIGHT AXILLARY LYMPH NODE DISECTION;  Surgeon: [name], MD;  Location: [facility];  Service: General;  Laterality: Right;  . BREAST LUMPECTOMY WITH RADIOACTIVE SEED LOCALIZATION Right 01/09/2014   Procedure: RIGHT BREAST SEED LOCALIZED LUMPECTOMY ;  Surgeon: [name], MD;  Location: [facility];  Service: General;  Laterality: Right;  . EYE SURGERY  12/22/2009   cataracts  . INTRAMEDULLARY (IM) NAIL INTERTROCHANTERIC Left 02/24/2014   Procedure: IM ROD LEFT HIP FX;  Surgeon: Alta Corning, MD;  Location: [facility];  Service: Orthopedics;  Laterality: Left;  . PORT-A-CATH REMOVAL Right 01/09/2014   Procedure: REMOVAL PORT-A-CATH;  Surgeon: [name], MD;  Location: [place] [facility] [place];  Service: General;  Laterality: Right;  . PORTACATH PLACEMENT N/A 11/15/2013   Procedure: INSERTION PORT-A-CATH WITH ULTRA SOUND AND FLOROSCOPY;  Surgeon: [name], MD;  Location: [place] [facility];  Service: General;  Laterality: N/A;  . TOTAL KNEE ARTHROPLASTY  2002   left    No family history on file.  Social History:  reports that she quit smoking about 58 years ago. She has a 5.00 pack-year smoking history. She has never used smokeless tobacco. She reports that she does not drink alcohol or use drugs.  Review of Systems:   Hypertension:   She is still taking lisinopril 20 mg daily, 4 mg doxazosin, 100 mg of metoprolol   Does not monitor BP at home  EDEMA She is taking Lasix 80 mg daily for edema, prescribed by PCP Also she is wearing elastic stockings but she is still has some swelling especially on the left    Renal function: Creatinine has  been variable, no abnormalities of potassium  Lab Results  Component Value Date   CREATININE 1.47 (H) 06/29/2017   CREATININE 1.38 (H) 05/05/2017   CREATININE 1.93 (H) 12/18/2016     Lipids: Has been on long-term Lipitor with good control     Lab Results  Component Value Date   CHOL 198 06/29/2017   HDL 70.80 06/29/2017   LDLCALC 115 (H) 06/29/2017   TRIG 62.0 06/29/2017   CHOLHDL 3 06/29/2017    She has had vitamin D deficiency, Treated with 2000 units vitamin D 3  GOITER: She has had a multinodular goiter for several years  Levothyroxine supplements were stopped in 5/16 and she has continued to be euthyroid  Her amiodarone has been continued for arrhythmias and only once her TSH has been below normal    Lab Results  Component Value Date   TSH 0.54 06/29/2017   TSH 0.25 (L) 10/30/2016   TSH 0.53 06/24/2016   FREET4 1.17 06/29/2017   FREET4 1.50 10/30/2016   FREET4 1.27 06/24/2016    Last foot exam in 7/17   ADRENAL mass: Following is a copy of the previous note  She had a CT scan done previously and was found to have a left adrenal mass  She was evaluated previously by a PET scan after  diagnosis of breast cancer in 2015 She was found to have a 3.7 cm hypermetabolic left adrenal mass On a previous CT scan in 2011 she also had a 3.8 cm adrenal mass Cortisol level is normal in 2015 and 2016 No history of hypokalemia or severe hypertension/palpitations, headaches, flushing or significant fluctuations in blood pressure Had evaluation with metanephrines and overnight dexamethasone test indicating normal adrenal function   Examination:   BP 128/78  (BP Location: Left Arm, Patient Position: Sitting, Cuff Size: Normal)   Pulse 84   Ht 5\' 7"  (1.702 m)   Wt 176 lb (79.8 kg)   SpO2 98%   BMI 27.57 kg/m   Body mass index is 27.57 kg/m.   Diabetic Foot Exam - Simple   Simple Foot Form Diabetic Foot exam was performed with the following findings:  Yes 10/29/2017  9:31 AM  Visual Inspection No deformities, no ulcerations, no other skin breakdown bilaterally:  Yes See comments:  Yes Sensation Testing Intact to touch and monofilament testing bilaterally:  Yes See comments:  Yes Pulse Check Posterior Tibialis and Dorsalis pulse intact bilaterally:  Yes Comments Left-side ankle has more edema.  Monofilament sensation slightly reduced bilaterally.  Some thickening of toenails present      ASSESSMENT/ PLAN:  Diabetes type 2   See history of present illness for  discussion of current diabetes management, blood sugar patterns and problems identified  Her A1c is expected at 5.9 Her home blood sugar relatively lower than on her last visit Currently on 100 mg Januvia although this is a relatively large dose considering her renal dysfunction  She is holding her weight down and is usually watching her diet  Recommended that she have her renal function checked today and adjust the dose of Januvia accordingly Most likely needs to be on 50 mg and this should be also adequate as monotherapy   GOITER: She has occasionally had a low TSH and this will be checked again especially since she is on amiodarone  EDEMA: Discussed that this is from venous insufficiency and she needs to use elastic stockings in addition to her diuretics  LIPIDS: Needs follow-up levels, LDL was high on the  last visit    Elayne Snare 10/29/2017, 9:49 AM    Note: This office note was prepared with Dragon voice recognition system technology. Any transcriptional errors that result from this process are unintentional.

## 2017-11-03 ENCOUNTER — Ambulatory Visit
Admission: RE | Admit: 2017-11-03 | Discharge: 2017-11-03 | Disposition: A | Discharge status: Home / Self Care | Payer: Medicare Other | Source: Ambulatory Visit | Attending: Hematology and Oncology | Admitting: Hematology and Oncology

## 2017-11-03 DIAGNOSIS — Z853 Personal history of malignant neoplasm of breast: Secondary | ICD-10-CM

## 2017-11-03 DIAGNOSIS — R928 Other abnormal and inconclusive findings on diagnostic imaging of breast: Secondary | ICD-10-CM | POA: Diagnosis not present

## 2017-11-05 ENCOUNTER — Encounter: Payer: Self-pay | Admitting: Family Medicine

## 2017-11-05 ENCOUNTER — Ambulatory Visit: Payer: Medicare Other | Admitting: Family Medicine

## 2017-11-05 VITALS — BP 110/70 | HR 56 | Temp 97.6°F | Resp 14 | Ht 66.5 in | Wt 174.0 lb

## 2017-11-05 DIAGNOSIS — H6123 Impacted cerumen, bilateral: Secondary | ICD-10-CM

## 2017-11-05 NOTE — Progress Notes (Signed)
Subjective:    Patient ID: Whitney [name], female    DOB: 06/18/1927, 84 y.o.   MRN: [id number]  HPI Patient reports bilateral hearing loss.  Went to be evaluated by an audiologist for hearing aids and was discovered to have bilateral cerumen impactions.  She was referred here for removal. Past Medical History:  Diagnosis Date  . Anemia   . Atrial fibrillation ([place])   . Breast cancer ([place]) 10/2013   right upper outer  . Cancer ([place])    right breast  . Complication of anesthesia    slow to wake up  . Diabetes mellitus without complication ([place])   . Dysrhythmia 10/15   AF  . Former smoker   . Full dentures   . Hyperlipidemia   . Hypertension   . Multinodular goiter   . Radiation    Right Breast  . Thyroid disease    hypothyroidism  . Wears glasses    Past Surgical History:  Procedure Laterality Date  . ABDOMINAL HYSTERECTOMY    . AXILLARY LYMPH NODE DISSECTION Right 01/09/2014   Procedure: RIGHT AXILLARY LYMPH NODE DISECTION;  Surgeon: [name], MD;  Location: [facility];  Service: General;  Laterality: Right;  . BREAST LUMPECTOMY WITH RADIOACTIVE SEED LOCALIZATION Right 01/09/2014   Procedure: RIGHT BREAST SEED LOCALIZED LUMPECTOMY ;  Surgeon: [name], MD;  Location: [facility];  Service: General;  Laterality: Right;  . EYE SURGERY  12/22/2009   cataracts  . INTRAMEDULLARY (IM) NAIL INTERTROCHANTERIC Left 02/24/2014   Procedure: IM ROD LEFT HIP FX;  Surgeon: Alta Corning, MD;  Location: [facility];  Service: Orthopedics;  Laterality: Left;  . PORT-A-CATH REMOVAL Right 01/09/2014   Procedure: REMOVAL PORT-A-CATH;  Surgeon: [name], MD;  Location: [facility];  Service: General;  Laterality: Right;  . PORTACATH PLACEMENT N/A 11/15/2013   Procedure: INSERTION PORT-A-CATH WITH ULTRA SOUND AND FLOROSCOPY;  Surgeon: [name], MD;  Location: [facility];  Service: General;  Laterality: N/A;  . TOTAL KNEE ARTHROPLASTY   2002   left   Current Outpatient Medications on File Prior to Visit  Medication Sig Dispense Refill  . amiodarone (PACERONE) 200 MG tablet TAKE 1 TABLET BY MOUTH EVERY DAY 30 tablet 5  . atorvastatin (LIPITOR) 20 MG tablet Take 1 tablet (20 mg total) by mouth daily. 90 tablet 3  . Carboxymeth-Glycerin-Polysorb (REFRESH OPTIVE ADVANCED OP) Place 1 drop into both eyes 3 (three) times daily.    Marland Kitchen doxazosin (CARDURA) 4 MG tablet TAKE 1 TABLET BY MOUTH EVERY DAY 90 tablet 2  . ELIQUIS 5 MG TABS tablet TAKE 1 TABLET TWICE A DAY 60 tablet 10  . furosemide (LASIX) 40 MG tablet Take 2 tablets (80 mg total) by mouth 2 (two) times daily. Please start to take lasix on 8/15 60 tablet 0  . furosemide (LASIX) 40 MG tablet TAKE 2 TABLETS BY MOUTH TWICE A DAY 360 tablet 2  . JANUVIA 100 MG tablet TAKE 1 TABLET BY MOUTH EVERY DAY 90 tablet 1  . KLOR-CON M20 20 MEQ tablet TAKE 1 TABLET BY MOUTH EVERY DAY 30 tablet 3  . lisinopril (PRINIVIL,ZESTRIL) 20 MG tablet Take 1 tablet (20 mg total) by mouth daily. 90 tablet 3  . meclizine (ANTIVERT) 12.5 MG tablet Take 1 tablet (12.5 mg total) by mouth daily as needed for dizziness. 10 tablet 0  . metoprolol succinate (TOPROL-XL) 100 MG 24 hr tablet TAKE 1 TABLET BY MOUTH TWICE A DAY  WITH OR IMMEDIATELY FOLLOWING A MEAL 180 tablet 1  . ONETOUCH VERIO test strip CHECK BLOOD SUGAR EVERY DAY AS DIRECTED 50 each 3  . Sennosides (EX-LAX) 15 MG TABS Take 1 tablet by mouth daily as needed (CONSTIPATION).    Marland Kitchen tamoxifen (NOLVADEX) 20 MG tablet Take 1 tablet (20 mg total) by mouth daily. 90 tablet 3   No current facility-administered medications on file prior to visit.    Allergies  Allergen Reactions  . Tape Other (See Comments)    Skin is somewhat sensitive   Social History   Socioeconomic History  . Marital status: Widowed    Spouse name: Not on file  . Number of children: 4  . Years of education: Not on file  . Highest education level: Not on file  Occupational  History  . Not on file  Social Needs  . Financial resource strain: Not on file  . Food insecurity:    Worry: Not on file    Inability: Not on file  . Transportation needs:    Medical: Not on file    Non-medical: Not on file  Tobacco Use  . Smoking status: Former Smoker    Packs/day: 1.00    Years: 5.00    Pack years: 5.00    Last attempt to quit: 11/14/1958    Years since quitting: 59.0  . Smokeless tobacco: Never Used  Substance and Sexual Activity  . Alcohol use: No    Alcohol/week: 0.0 standard drinks  . Drug use: No  . Sexual activity: Never    Comment: menarche age 14, P66, first birth age 38, no HRT, menopause age 9  Lifestyle  . Physical activity:    Days per week: Not on file    Minutes per session: Not on file  . Stress: Not on file  Relationships  . Social connections:    Talks on phone: Not on file    Gets together: Not on file    Attends religious service: Not on file    Active member of club or organization: Not on file    Attends meetings of clubs or organizations: Not on file    Relationship status: Not on file  . Intimate partner violence:    Fear of current or ex partner: Not on file    Emotionally abused: Not on file    Physically abused: Not on file    Forced sexual activity: Not on file  Other Topics Concern  . Not on file  Social History Narrative  . Not on file      Review of Systems  All other systems reviewed and are negative.      Objective:   Physical Exam  Constitutional: She appears well-developed and well-nourished. No distress.  HENT:  Right Ear: Decreased hearing is noted.  Left Ear: Decreased hearing is noted.  Cardiovascular: Normal rate and regular rhythm.  Pulmonary/Chest: Effort normal and breath sounds normal. No stridor. No respiratory distress. She has no wheezes.  Skin: She is not diaphoretic.  Vitals reviewed.         Assessment & Plan:  Bilateral impacted cerumen  Patient has bilateral cerumen  impactions.  We were easily able to remove the cerumen impaction from the right auditory canal with irrigation and lavage.  The cerumen impaction in the left auditory canal was extremely hard thick.  Despite using curettes, alligator forceps, and irrigation and lavage we were unable to successfully remove all of the cerumen.  There was still a large  piece adherent to the tympanic membrane.  Attempting to remove it was causing pain.  Therefore we recommended using Debrox drops for 3 to 5 days and then returning to clinic next week for a second attempt after allowing the Debrox to try to soften the cerumen impaction.

## 2017-11-12 ENCOUNTER — Encounter: Payer: Self-pay | Admitting: Family Medicine

## 2017-11-12 ENCOUNTER — Ambulatory Visit: Payer: Medicare Other | Admitting: Family Medicine

## 2017-11-12 VITALS — BP 130/68 | HR 66 | Temp 98.2°F | Resp 16 | Ht 66.5 in | Wt 174.0 lb

## 2017-11-12 DIAGNOSIS — H6123 Impacted cerumen, bilateral: Secondary | ICD-10-CM

## 2017-11-12 NOTE — Progress Notes (Signed)
Subjective:    Patient ID: Whitney [name], female    DOB: Dec 21, 1927, 84 y.o.   MRN: [id number]  HPI  11/05/17 Patient reports bilateral hearing loss.  Went to be evaluated by an audiologist for hearing aids and was discovered to have bilateral cerumen impactions.  She was referred here for removal.  At that time, my plan was: Patient has bilateral cerumen impactions.  We were easily able to remove the cerumen impaction from the right auditory canal with irrigation and lavage.  The cerumen impaction in the left auditory canal was extremely hard thick.  Despite using curettes, alligator forceps, and irrigation and lavage we were unable to successfully remove all of the cerumen.  There was still a large piece adherent to the tympanic membrane.  Attempting to remove it was causing pain.  Therefore we recommended using Debrox drops for 3 to 5 days and then returning to clinic next week for a second attempt after allowing the Debrox to try to soften the cerumen impaction.  11/12/17 Patient is here today to attempt to remove the cerumen from her left ear.  She has been using Debrox drops as directed.  She continues to demonstrate hearing loss in both the left and right ear however the left ear is worse in the right ear Past Medical History:  Diagnosis Date  . Anemia   . Atrial fibrillation ([place])   . Breast cancer ([place]) 10/2013   right upper outer  . Cancer ([place])    right breast  . Complication of anesthesia    slow to wake up  . Diabetes mellitus without complication ([place])   . Dysrhythmia 10/15   AF  . Former smoker   . Full dentures   . Hyperlipidemia   . Hypertension   . Multinodular goiter   . Radiation    Right Breast  . Thyroid disease    hypothyroidism  . Wears glasses    Past Surgical History:  Procedure Laterality Date  . ABDOMINAL HYSTERECTOMY    . AXILLARY LYMPH NODE DISSECTION Right 01/09/2014   Procedure: RIGHT AXILLARY LYMPH NODE DISECTION;  Surgeon: [name],  MD;  Location: [facility];  Service: General;  Laterality: Right;  . BREAST LUMPECTOMY WITH RADIOACTIVE SEED LOCALIZATION Right 01/09/2014   Procedure: RIGHT BREAST SEED LOCALIZED LUMPECTOMY ;  Surgeon: [name], MD;  Location: [facility];  Service: General;  Laterality: Right;  . EYE SURGERY  12/22/2009   cataracts  . INTRAMEDULLARY (IM) NAIL INTERTROCHANTERIC Left 02/24/2014   Procedure: IM ROD LEFT HIP FX;  Surgeon: Alta Corning, MD;  Location: [facility];  Service: Orthopedics;  Laterality: Left;  . PORT-A-CATH REMOVAL Right 01/09/2014   Procedure: REMOVAL PORT-A-CATH;  Surgeon: [name], MD;  Location: ;  Service: General;  Laterality: Right;  . PORTACATH PLACEMENT N/A 11/15/2013   Procedure: INSERTION PORT-A-CATH WITH ULTRA SOUND AND FLOROSCOPY;  Surgeon: [name], MD;  Location: [facility];  Service: General;  Laterality: N/A;  . TOTAL KNEE ARTHROPLASTY  2002   left   Current Outpatient Medications on File Prior to Visit  Medication Sig Dispense Refill  . amiodarone (PACERONE) 200 MG tablet TAKE 1 TABLET BY MOUTH EVERY DAY 30 tablet 5  . atorvastatin (LIPITOR) 20 MG tablet Take 1 tablet (20 mg total) by mouth daily. 90 tablet 3  . Carboxymeth-Glycerin-Polysorb (REFRESH OPTIVE ADVANCED OP) Place 1 drop into both eyes 3 (three) times daily.    Marland Kitchen doxazosin (CARDURA)  4 MG tablet TAKE 1 TABLET BY MOUTH EVERY DAY 90 tablet 2  . ELIQUIS 5 MG TABS tablet TAKE 1 TABLET TWICE A DAY 60 tablet 10  . furosemide (LASIX) 40 MG tablet Take 2 tablets (80 mg total) by mouth 2 (two) times daily. Please start to take lasix on 8/15 60 tablet 0  . furosemide (LASIX) 40 MG tablet TAKE 2 TABLETS BY MOUTH TWICE A DAY 360 tablet 2  . JANUVIA 100 MG tablet TAKE 1 TABLET BY MOUTH EVERY DAY 90 tablet 1  . KLOR-CON M20 20 MEQ tablet TAKE 1 TABLET BY MOUTH EVERY DAY 30 tablet 3  . lisinopril (PRINIVIL,ZESTRIL) 20 MG tablet Take 1 tablet (20 mg total) by  mouth daily. 90 tablet 3  . meclizine (ANTIVERT) 12.5 MG tablet Take 1 tablet (12.5 mg total) by mouth daily as needed for dizziness. 10 tablet 0  . metoprolol succinate (TOPROL-XL) 100 MG 24 hr tablet TAKE 1 TABLET BY MOUTH TWICE A DAY WITH OR IMMEDIATELY FOLLOWING A MEAL 180 tablet 1  . ONETOUCH VERIO test strip CHECK BLOOD SUGAR EVERY DAY AS DIRECTED 50 each 3  . Sennosides (EX-LAX) 15 MG TABS Take 1 tablet by mouth daily as needed (CONSTIPATION).    Marland Kitchen tamoxifen (NOLVADEX) 20 MG tablet Take 1 tablet (20 mg total) by mouth daily. 90 tablet 3   No current facility-administered medications on file prior to visit.    Allergies  Allergen Reactions  . Tape Other (See Comments)    Skin is somewhat sensitive   Social History   Socioeconomic History  . Marital status: Widowed    Spouse name: Not on file  . Number of children: 4  . Years of education: Not on file  . Highest education level: Not on file  Occupational History  . Not on file  Social Needs  . Financial resource strain: Not on file  . Food insecurity:    Worry: Not on file    Inability: Not on file  . Transportation needs:    Medical: Not on file    Non-medical: Not on file  Tobacco Use  . Smoking status: Former Smoker    Packs/day: 1.00    Years: 5.00    Pack years: 5.00    Last attempt to quit: 11/14/1958    Years since quitting: 59.0  . Smokeless tobacco: Never Used  Substance and Sexual Activity  . Alcohol use: No    Alcohol/week: 0.0 standard drinks  . Drug use: No  . Sexual activity: Never    Comment: menarche age 66, P87, first birth age 65, no HRT, menopause age 80  Lifestyle  . Physical activity:    Days per week: Not on file    Minutes per session: Not on file  . Stress: Not on file  Relationships  . Social connections:    Talks on phone: Not on file    Gets together: Not on file    Attends religious service: Not on file    Active member of club or organization: Not on file    Attends meetings of  clubs or organizations: Not on file    Relationship status: Not on file  . Intimate partner violence:    Fear of current or ex partner: Not on file    Emotionally abused: Not on file    Physically abused: Not on file    Forced sexual activity: Not on file  Other Topics Concern  . Not on file  Social History  Narrative  . Not on file      Review of Systems  All other systems reviewed and are negative.      Objective:   Physical Exam  Constitutional: She appears well-developed and well-nourished. No distress.  HENT:  Right Ear: Decreased hearing is noted.  Left Ear: Decreased hearing is noted.  Cardiovascular: Normal rate and regular rhythm.  Pulmonary/Chest: Effort normal and breath sounds normal. No stridor. No respiratory distress. She has no wheezes.  Skin: She is not diaphoretic.  Vitals reviewed.  cerumen impaction in the left auditory canal      Assessment & Plan:  Bilateral impacted cerumen  Cerumen impaction was removed with irrigation and lavage.  Patient tolerated the procedure well without complication

## 2017-11-20 ENCOUNTER — Other Ambulatory Visit: Payer: Self-pay | Admitting: Family Medicine

## 2017-11-23 ENCOUNTER — Other Ambulatory Visit: Payer: Self-pay | Admitting: Emergency Medicine

## 2017-11-23 ENCOUNTER — Telehealth: Payer: Self-pay | Admitting: Endocrinology

## 2017-11-23 MED ORDER — ONETOUCH VERIO W/DEVICE KIT: PACK | 0 refills | Status: AC

## 2017-11-23 NOTE — Telephone Encounter (Signed)
New RX for [employer] meter has been sent to [facility]. Pt's granddaughter notified.

## 2017-11-23 NOTE — Telephone Encounter (Signed)
Daughter Ms. Liane Comber called ph# [phone] requesting a call back to discuss getting patient a new meter-original meter was at no cost to patient

## 2017-12-09 ENCOUNTER — Other Ambulatory Visit: Payer: Self-pay | Admitting: Family Medicine

## 2017-12-14 ENCOUNTER — Ambulatory Visit (INDEPENDENT_AMBULATORY_CARE_PROVIDER_SITE_OTHER): Payer: Medicare Other

## 2017-12-14 DIAGNOSIS — Z23 Encounter for immunization: Secondary | ICD-10-CM

## 2017-12-14 NOTE — Progress Notes (Signed)
Patient was in office for high dose flu vaccine.Patient received vaccine in her left deltoid. Patient tolerated well.

## 2017-12-20 ENCOUNTER — Other Ambulatory Visit: Payer: Self-pay | Admitting: Endocrinology

## 2017-12-23 ENCOUNTER — Ambulatory Visit (INDEPENDENT_AMBULATORY_CARE_PROVIDER_SITE_OTHER): Payer: Medicare Other | Admitting: Orthopaedic Surgery

## 2017-12-23 DIAGNOSIS — M1711 Unilateral primary osteoarthritis, right knee: Secondary | ICD-10-CM | POA: Diagnosis not present

## 2017-12-23 NOTE — Progress Notes (Signed)
Office Visit Note   Patient: Whitney [name]           Date of Birth: [birth year]           MRN: [id number] Visit Date: 12/23/2017              Requested by: [name], MD [address] [facility] [place], [place] [address] PCP: [name], MD   Assessment & Plan: Visit Diagnoses:  1. Unilateral primary osteoarthritis, right knee     Plan: Today we injected her right knee with Depo-Medrol.  Patient tolerated this well.  She will let us know how she responds this injection.  We briefly discussed total knee replacement but certainly this would be the very last resort.  Follow-Up Instructions: Return if symptoms worsen or fail to improve.   Orders:  No orders of the defined types were placed in this encounter.  No orders of the defined types were placed in this encounter.     Procedures: No procedures performed   Clinical Data: No additional findings.   Subjective: Chief Complaint  Patient presents with  . Right Knee - Follow-up    Whitney [name] comes in for her right knee degenerative joint disease she received a Monovisc injection in April which gave her minimal relief.  She had a cortisone injection today.  She has had good relief from prior ones.   Review of Systems   Objective: Vital Signs: There were no vitals taken for this visit.  Physical Exam  Ortho Exam Right knee exam shows no joint effusion. Specialty Comments:  No specialty comments available.  Imaging: No results found.   PMFS History: Patient Active Problem List   Diagnosis Date Noted  . Chronic pain of right knee 05/25/2017  . Bacteremia 10/25/2016  . Transaminitis 10/23/2016  . Sinus bradycardia 10/23/2016  . Controlled diabetes mellitus type 2 with complications ([place])   . Paroxysmal atrial fibrillation (HCC)   . Chronic diastolic CHF (congestive heart failure) ([place])   . Pulmonary hypertension ([place])   . Acute renal failure superimposed on stage 2 chronic kidney disease  ([place])   . Elevated liver enzymes   . Bacteremia due to Escherichia coli 12/30/2015  . UTI (urinary tract infection) 12/30/2015  . Acute blood loss anemia 02/25/2014  . Closed left hip fracture ([place]) 02/24/2014  . PAF (paroxysmal atrial fibrillation) ([place]) 01/30/2014  . Anticoagulated 01/30/2014  . Chronic diastolic CHF (congestive heart failure), NYHA class 2 ([place]) 12/10/2013  . Preoperative cardiovascular examination 12/10/2013  . Neutropenic fever ([place]) 12/08/2013  . Anemia of chronic disease 12/08/2013  . Thrombocytopenia ([place]) 12/08/2013  . Hypokalemia 12/08/2013  . Pancytopenia due to antineoplastic chemotherapy ([place]) 12/08/2013  . Hypothyroidism 12/08/2013  . Breast cancer of upper-outer quadrant of right female breast ([place]) 11/01/2013  . Essential hypertension, benign 12/01/2012  . Pure hypercholesterolemia 12/01/2012  . DM2 (diabetes mellitus, type 2) ([place]) 11/21/2012   Past Medical History:  Diagnosis Date  . Anemia   . Atrial fibrillation ([place])   . Breast cancer ([place]) 10/2013   right upper outer  . Cancer ([place])    right breast  . Complication of anesthesia    slow to wake up  . Diabetes mellitus without complication ([place])   . Dysrhythmia 10/15   AF  . Former smoker   . Full dentures   . Hyperlipidemia   . Hypertension   . Multinodular goiter   . Radiation    Right Breast  . Thyroid  disease    hypothyroidism  . Wears glasses     No family history on file.  Past Surgical History:  Procedure Laterality Date  . ABDOMINAL HYSTERECTOMY    . AXILLARY LYMPH NODE DISSECTION Right 01/09/2014   Procedure: RIGHT AXILLARY LYMPH NODE DISECTION;  Surgeon: [name], MD;  Location: [facility];  Service: General;  Laterality: Right;  . BREAST LUMPECTOMY WITH RADIOACTIVE SEED LOCALIZATION Right 01/09/2014   Procedure: RIGHT BREAST SEED LOCALIZED LUMPECTOMY ;  Surgeon: [name], MD;  Location: [place];  Service: General;   Laterality: Right;  . EYE SURGERY  12/22/2009   cataracts  . INTRAMEDULLARY (IM) NAIL INTERTROCHANTERIC Left 02/24/2014   Procedure: IM ROD LEFT HIP FX;  Surgeon: Alta Corning, MD;  Location: [facility];  Service: Orthopedics;  Laterality: Left;  . PORT-A-CATH REMOVAL Right 01/09/2014   Procedure: REMOVAL PORT-A-CATH;  Surgeon: [name], MD;  Location: [facility];  Service: General;  Laterality: Right;  . PORTACATH PLACEMENT N/A 11/15/2013   Procedure: INSERTION PORT-A-CATH WITH ULTRA SOUND AND FLOROSCOPY;  Surgeon: [name], MD;  Location: [facility] [place];  Service: General;  Laterality: N/A;  . TOTAL KNEE ARTHROPLASTY  2002   left   Social History   Occupational History  . Not on file  Tobacco Use  . Smoking status: Former Smoker    Packs/day: 1.00    Years: 5.00    Pack years: 5.00    Last attempt to quit: 11/14/1958    Years since quitting: 59.1  . Smokeless tobacco: Never Used  Substance and Sexual Activity  . Alcohol use: No    Alcohol/week: 0.0 standard drinks  . Drug use: No  . Sexual activity: Never    Comment: menarche age 8, P75, first birth age 28, no HRT, menopause age 48

## 2017-12-25 ENCOUNTER — Other Ambulatory Visit: Payer: Self-pay | Admitting: Family Medicine

## 2017-12-25 DIAGNOSIS — I10 Essential (primary) hypertension: Secondary | ICD-10-CM

## 2017-12-29 ENCOUNTER — Other Ambulatory Visit: Payer: Self-pay | Admitting: Endocrinology

## 2017-12-30 ENCOUNTER — Other Ambulatory Visit: Payer: Self-pay | Admitting: Family Medicine

## 2018-01-12 ENCOUNTER — Other Ambulatory Visit: Payer: Self-pay | Admitting: Endocrinology

## 2018-02-25 ENCOUNTER — Other Ambulatory Visit: Payer: Self-pay | Admitting: Endocrinology

## 2018-02-28 ENCOUNTER — Ambulatory Visit: Payer: Medicare Other | Admitting: Endocrinology

## 2018-02-28 ENCOUNTER — Encounter: Payer: Self-pay | Admitting: Endocrinology

## 2018-02-28 VITALS — BP 142/72 | HR 56 | Ht 66.5 in | Wt 174.4 lb

## 2018-02-28 DIAGNOSIS — E042 Nontoxic multinodular goiter: Secondary | ICD-10-CM | POA: Diagnosis not present

## 2018-02-28 DIAGNOSIS — E7800 Pure hypercholesterolemia, unspecified: Secondary | ICD-10-CM | POA: Diagnosis not present

## 2018-02-28 DIAGNOSIS — E119 Type 2 diabetes mellitus without complications: Secondary | ICD-10-CM | POA: Diagnosis not present

## 2018-02-28 LAB — COMPREHENSIVE METABOLIC PANEL
ALT: 25 U/L (ref 0–35)
AST: 29 U/L (ref 0–37)
Albumin: 3.3 g/dL — ABNORMAL LOW (ref 3.5–5.2)
Alkaline Phosphatase: 41 U/L (ref 39–117)
BUN: 34 mg/dL — ABNORMAL HIGH (ref 6–23)
CO2: 28 mEq/L (ref 19–32)
Calcium: 8.6 mg/dL (ref 8.4–10.5)
Chloride: 104 mEq/L (ref 96–112)
Creatinine, Ser: 1.72 mg/dL — ABNORMAL HIGH (ref 0.40–1.20)
GFR: 35.99 mL/min — ABNORMAL LOW (ref >60.00)
Glucose, Bld: 186 mg/dL — ABNORMAL HIGH (ref 70–99)
Potassium: 3.6 mEq/L (ref 3.5–5.1)
Sodium: 139 mEq/L (ref 135–145)
Total Bilirubin: 0.6 mg/dL (ref 0.2–1.2)
Total Protein: 6 g/dL (ref 6.0–8.3)

## 2018-02-28 LAB — POCT GLYCOSYLATED HEMOGLOBIN (HGB A1C): Hemoglobin A1C: 5.9 % — AB (ref 4.0–5.6)

## 2018-02-28 NOTE — Progress Notes (Signed)
Patient ID: Whitney [name], female   DOB: 1927-08-28, 84 y.o.   MRN: [id number]   Reason for Appointment: Diabetes and other problems  History of Present Illness     Type 2 DIABETES MELITUS, date of diagnosis:  1983     Previous history: She had been taking metformin and Actos for several years.  She usually has had excellent control with upper normal A1c  Recent history:   Oral hypoglycemic drugs: Januvia 100 mg daily    She was previously on  Metformin and Actos; because of edema she was told to stop Actos Her metformin was stopped because of renal dysfunction and Amaryl because of hypoglycemia  Her A1c is again excellent at 5.9  Current blood sugar patterns, management details:  Her weight is about the same  She does have variable blood sugars after evening meal  She thinks that when her sugars go up this is because of eating some dessert or sweets  Otherwise usually has sugar-free drinks  Not able to do much physical activity because of knee pain  Continues to be on 100 mg of Januvia, no side effects with this        Monitors blood glucose:   less than once a day .    Glucometer: One Touch.           Overall MEDIAN blood sugar is 167  Blood sugar range 110-207, usually done around 6 PM   Physical activity: exercise: Minimal         Wt Readings from Last 3 Encounters:  02/28/18 174 lb 6.4 oz (79.1 kg)  11/12/17 174 lb (78.9 kg)  11/05/17 174 lb (78.9 kg)    LABS:  Lab Results  Component Value Date   HGBA1C 5.9 (A) 02/28/2018   HGBA1C 5.9 (A) 10/29/2017   HGBA1C 6.3 06/29/2017   Lab Results  Component Value Date   MICROALBUR 5.3 (H) 06/29/2017   LDLCALC 65 10/29/2017   CREATININE 1.43 (H) 10/29/2017     OTHER problems  are discussed in review of systems      Allergies as of 02/28/2018      Reactions   Tape Other (See Comments)   Skin is somewhat sensitive      Medication List       Accurate as of February 28, 2018   9:37 AM. Always use your most recent med list.        amiodarone 200 MG tablet Commonly known as:  PACERONE TAKE 1 TABLET BY MOUTH EVERY DAY   atorvastatin 20 MG tablet Commonly known as:  LIPITOR Take 1 tablet (20 mg total) by mouth daily.   doxazosin 4 MG tablet Commonly known as:  CARDURA TAKE 1 TABLET BY MOUTH EVERY DAY   ELIQUIS 5 MG Tabs tablet Generic drug:  apixaban TAKE 1 TABLET TWICE A DAY   EX-LAX 15 MG Tabs Generic drug:  Sennosides Take 1 tablet by mouth daily as needed (CONSTIPATION).   furosemide 40 MG tablet Commonly known as:  LASIX Take 2 tablets (80 mg total) by mouth 2 (two) times daily. Please start to take lasix on 8/15   JANUVIA 100 MG tablet Generic drug:  sitaGLIPtin TAKE 1 TABLET BY MOUTH EVERY DAY   KLOR-CON M20 20 MEQ tablet Generic drug:  potassium chloride SA TAKE 1 TABLET BY MOUTH EVERY DAY   lisinopril 20 MG tablet Commonly known as:  PRINIVIL,ZESTRIL TAKE 1 TABLET BY MOUTH EVERY DAY   meclizine 12.5 MG tablet  Commonly known as:  ANTIVERT Take 1 tablet (12.5 mg total) by mouth daily as needed for dizziness.   metoprolol succinate 100 MG 24 hr tablet Commonly known as:  TOPROL-XL TAKE 1 TABLET BY MOUTH TWICE A DAY WITH OR IMMEDIATELY FOLLOWING A MEAL   ONETOUCH VERIO test strip Generic drug:  glucose blood CHECK BLOOD SUGAR EVERY DAY AS DIRECTED   ONETOUCH VERIO w/Device Kit Use to test blood sugars daily.   REFRESH OPTIVE ADVANCED OP Place 1 drop into both eyes 3 (three) times daily.   tamoxifen 20 MG tablet Commonly known as:  NOLVADEX Take 1 tablet (20 mg total) by mouth daily.       Allergies:  Allergies  Allergen Reactions  . Tape Other (See Comments)    Skin is somewhat sensitive    Past Medical History:  Diagnosis Date  . Anemia   . Atrial fibrillation ([place])   . Breast cancer ([place]) 10/2013   right upper outer  . Cancer ([place])    right breast  . Complication of anesthesia    slow to wake up  .  Diabetes mellitus without complication ([place])   . Dysrhythmia 10/15   AF  . Former smoker   . Full dentures   . Hyperlipidemia   . Hypertension   . Multinodular goiter   . Radiation    Right Breast  . Thyroid disease    hypothyroidism  . Wears glasses     Past Surgical History:  Procedure Laterality Date  . ABDOMINAL HYSTERECTOMY    . AXILLARY LYMPH NODE DISSECTION Right 01/09/2014   Procedure: RIGHT AXILLARY LYMPH NODE DISECTION;  Surgeon: [name], MD;  Location: [facility];  Service: General;  Laterality: Right;  . BREAST LUMPECTOMY WITH RADIOACTIVE SEED LOCALIZATION Right 01/09/2014   Procedure: RIGHT BREAST SEED LOCALIZED LUMPECTOMY ;  Surgeon: [name], MD;  Location: [facility];  Service: General;  Laterality: Right;  . EYE SURGERY  12/22/2009   cataracts  . INTRAMEDULLARY (IM) NAIL INTERTROCHANTERIC Left 02/24/2014   Procedure: IM ROD LEFT HIP FX;  Surgeon: Alta Corning, MD;  Location: [facility];  Service: Orthopedics;  Laterality: Left;  . PORT-A-CATH REMOVAL Right 01/09/2014   Procedure: REMOVAL PORT-A-CATH;  Surgeon: [name], MD;  Location: [facility];  Service: General;  Laterality: Right;  . PORTACATH PLACEMENT N/A 11/15/2013   Procedure: INSERTION PORT-A-CATH WITH ULTRA SOUND AND FLOROSCOPY;  Surgeon: [name], MD;  Location: [facility];  Service: General;  Laterality: N/A;  . TOTAL KNEE ARTHROPLASTY  2002   left    History reviewed. No pertinent family history.  Social History:  reports that she quit smoking about 59 years ago. She has a 5.00 pack-year smoking history. She has never used smokeless tobacco. She reports that she does not drink alcohol or use drugs.  Review of Systems:   Hypertension:  She is still taking lisinopril 20 mg daily, 4 mg doxazosin, 100 mg of metoprolol  Followed by PCP  EDEMA She is taking Lasix 80 mg daily for edema, prescribed by PCP She is consistent with using her  elastic stockings   Renal function: Creatinine has been variable, no recent labs available  Lab Results  Component Value Date   CREATININE 1.43 (H) 10/29/2017   CREATININE 1.47 (H) 06/29/2017   CREATININE 1.38 (H) 05/05/2017     Lipids: Has been on long-term Lipitor with good control     Lab Results  Component Value Date  CHOL 137 10/29/2017   HDL 59.50 10/29/2017   LDLCALC 65 10/29/2017   TRIG 65.0 10/29/2017   CHOLHDL 2 10/29/2017    She has had vitamin D deficiency, Treated with 2000 units vitamin D 3  GOITER: She has had a multinodular goiter for several years, this is small on recent exams  Levothyroxine supplements were stopped in 5/16 and she has continued to be euthyroid  She still has been treated with 200 mg of amiodarone   Lab Results  Component Value Date   TSH 0.68 10/29/2017   TSH 0.54 06/29/2017   TSH 0.25 (L) 10/30/2016   FREET4 1.47 10/29/2017   FREET4 1.17 06/29/2017   FREET4 1.50 10/30/2016    Last foot exam in 8/19   ADRENAL mass: Following is a copy of the previous note  She had a CT scan done previously and was found to have a left adrenal mass  She was evaluated previously by a PET scan after  diagnosis of breast cancer in 2015 She was found to have a 3.7 cm hypermetabolic left adrenal mass On a previous CT scan in 2011 she also had a 3.8 cm adrenal mass Cortisol level is normal in 2015 and 2016 No history of hypokalemia or severe hypertension/palpitations, headaches, flushing or significant fluctuations in blood pressure Had evaluation with metanephrines and overnight dexamethasone test indicating normal adrenal function   Examination:   BP (!) 142/72 (BP Location: Left Arm, Patient Position: Sitting, Cuff Size: Normal)   Pulse (!) 56   Ht 5' 6.5" (1.689 m)   Wt 174 lb 6.4 oz (79.1 kg)   SpO2 97%   BMI 27.73 kg/m   Body mass index is 27.73 kg/m.   Left thyroid is palpable, slightly nodular and about 1-1/2-2 times  normal Right side is just palpable on swallowing, also firm but no distinct nodule  Ankles appear to be having about 2+ edema she is wearing elastic stockings  ASSESSMENT/ PLAN:  Diabetes type 2   See history of present illness for  discussion of current diabetes management, blood sugar patterns and problems identified  Her A1c is unchanged at 5.9 Her home blood sugar relatively higher when checked after dinner This is likely to be from variable diet especially with eating some sweets However considering her age and multiple medical problems she is still doing very well Has not gained any weight  Discussed that if her renal function is still showing GFR below 50 will change her Januvia prescription to 50 mg   GOITER: Clinically stable and TSH has been consistently normal  Hypertension: Followed by PCP and will continue the same regimen    Elayne Snare 02/28/2018, 9:37 AM    Note: This office note was prepared with Dragon voice recognition system technology. Any transcriptional errors that result from this process are unintentional.

## 2018-03-01 ENCOUNTER — Telehealth: Payer: Self-pay

## 2018-03-01 MED ORDER — METOPROLOL SUCCINATE ER 100 MG PO TB24: ORAL_TABLET | ORAL | 1 refills | Status: DC

## 2018-03-01 NOTE — Telephone Encounter (Signed)
Rx sent 

## 2018-03-04 ENCOUNTER — Other Ambulatory Visit: Payer: Self-pay | Admitting: Family Medicine

## 2018-03-25 ENCOUNTER — Ambulatory Visit (INDEPENDENT_AMBULATORY_CARE_PROVIDER_SITE_OTHER): Payer: Medicare Other | Admitting: Orthopaedic Surgery

## 2018-03-25 ENCOUNTER — Encounter (INDEPENDENT_AMBULATORY_CARE_PROVIDER_SITE_OTHER): Payer: Self-pay | Admitting: Orthopaedic Surgery

## 2018-03-25 ENCOUNTER — Telehealth (INDEPENDENT_AMBULATORY_CARE_PROVIDER_SITE_OTHER): Payer: Self-pay

## 2018-03-25 DIAGNOSIS — M1711 Unilateral primary osteoarthritis, right knee: Secondary | ICD-10-CM

## 2018-03-25 MED ORDER — DICLOFENAC SODIUM 1 % TD GEL: 2.0000 g | Freq: Four times a day (QID) | TRANSDERMAL | 2 refills | Status: DC
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
## 2018-03-25 NOTE — Telephone Encounter (Signed)
Please submit right knee gel inj- Dr Erlinda Hong.

## 2018-03-29 NOTE — Telephone Encounter (Signed)
Noted  

## 2018-04-05 ENCOUNTER — Telehealth (INDEPENDENT_AMBULATORY_CARE_PROVIDER_SITE_OTHER): Payer: Self-pay | Admitting: Orthopaedic Surgery

## 2018-04-05 NOTE — Telephone Encounter (Signed)
Patient's daughter [place] called asked if patient has been approved for the gel injection. The number to contact [place] is [phone] or [phone]

## 2018-04-06 ENCOUNTER — Telehealth (INDEPENDENT_AMBULATORY_CARE_PROVIDER_SITE_OTHER): Payer: Self-pay

## 2018-04-06 NOTE — Telephone Encounter (Signed)
Submitted VOB for SynviscOne, right knee. 

## 2018-04-06 NOTE — Telephone Encounter (Signed)
Talked with Whitney [name] and advised her that I am waiting to receive VOB and once received, I will give her a call to schedule.

## 2018-04-07 ENCOUNTER — Telehealth (INDEPENDENT_AMBULATORY_CARE_PROVIDER_SITE_OTHER): Payer: Self-pay

## 2018-04-07 NOTE — Telephone Encounter (Signed)
Talked with patient and advised her that she is approved for gel injection.  Approved for SynviscOne, Right Knee Buy & Bill Patient will be responsible for 20% OOP. Co-pay of $40.00 No PA required  Appt.04/12/2018 with Dr. Erlinda Hong

## 2018-04-12 ENCOUNTER — Ambulatory Visit (INDEPENDENT_AMBULATORY_CARE_PROVIDER_SITE_OTHER): Payer: Medicare Other | Admitting: Orthopaedic Surgery

## 2018-04-12 ENCOUNTER — Encounter (INDEPENDENT_AMBULATORY_CARE_PROVIDER_SITE_OTHER): Payer: Self-pay | Admitting: Orthopaedic Surgery

## 2018-04-12 DIAGNOSIS — M1711 Unilateral primary osteoarthritis, right knee: Secondary | ICD-10-CM

## 2018-04-12 MED ORDER — LIDOCAINE HCL 1 % IJ SOLN
2.0000 mL | INTRAMUSCULAR | Status: AC | PRN
  Administered 2018-04-12: 2 mL

## 2018-04-12 MED ORDER — BUPIVACAINE HCL 0.25 % IJ SOLN
2.0000 mL | INTRAMUSCULAR | Status: AC | PRN
  Administered 2018-04-12: 2 mL via INTRA_ARTICULAR

## 2018-04-12 MED ORDER — HYLAN G-F 20 48 MG/6ML IX SOSY
48.0000 mg | PREFILLED_SYRINGE | INTRA_ARTICULAR | Status: AC | PRN
  Administered 2018-04-12: 48 mg via INTRA_ARTICULAR

## 2018-04-12 NOTE — Progress Notes (Signed)
   Procedure Note  Patient: Whitney [name]             Date of Birth: 18-Nov-1927           MRN: [id number]             Visit Date: 04/12/2018  Procedures: Visit Diagnoses: Unilateral primary osteoarthritis, right knee  Large Joint Inj: R knee on 04/12/2018 9:09 AM Indications: pain Details: 22 G needle, anterolateral approach Medications: 2 mL lidocaine 1 %; 2 mL bupivacaine 0.25 %; 48 mg Hylan 48 MG/6ML

## 2018-04-14 ENCOUNTER — Other Ambulatory Visit: Payer: Self-pay | Admitting: Family Medicine

## 2018-04-18 ENCOUNTER — Ambulatory Visit (INDEPENDENT_AMBULATORY_CARE_PROVIDER_SITE_OTHER): Payer: Medicare Other | Admitting: Family Medicine

## 2018-04-18 ENCOUNTER — Encounter: Payer: Self-pay | Admitting: Family Medicine

## 2018-04-18 VITALS — BP 130/70 | HR 60 | Temp 97.9°F | Resp 14 | Ht 66.5 in | Wt 175.0 lb

## 2018-04-18 DIAGNOSIS — E118 Type 2 diabetes mellitus with unspecified complications: Secondary | ICD-10-CM | POA: Diagnosis not present

## 2018-04-18 DIAGNOSIS — Z0000 Encounter for general adult medical examination without abnormal findings: Secondary | ICD-10-CM

## 2018-04-18 DIAGNOSIS — Z780 Asymptomatic menopausal state: Secondary | ICD-10-CM | POA: Diagnosis not present

## 2018-04-18 DIAGNOSIS — S2220XA Unspecified fracture of sternum, initial encounter for closed fracture: Secondary | ICD-10-CM | POA: Diagnosis not present

## 2018-04-18 DIAGNOSIS — I480 Paroxysmal atrial fibrillation: Secondary | ICD-10-CM

## 2018-04-18 DIAGNOSIS — R269 Unspecified abnormalities of gait and mobility: Secondary | ICD-10-CM | POA: Diagnosis not present

## 2018-04-18 DIAGNOSIS — I5032 Chronic diastolic (congestive) heart failure: Secondary | ICD-10-CM | POA: Diagnosis not present

## 2018-04-18 DIAGNOSIS — M6281 Muscle weakness (generalized): Secondary | ICD-10-CM | POA: Diagnosis not present

## 2018-04-18 DIAGNOSIS — S8253XA Displaced fracture of medial malleolus of unspecified tibia, initial encounter for closed fracture: Secondary | ICD-10-CM | POA: Diagnosis not present

## 2018-04-18 NOTE — Progress Notes (Signed)
Subjective:    Patient ID: Whitney [name], female    DOB: [birth year], 85 y.o.   MRN: [id number]  HPI Patient is here today for complete physical exam.  She is a very sweet and pleasant 85 year old African-American female.  Her blood pressure today is well controlled at 130/70.  I reviewed her immunization records below: Immunization History  Administered Date(s) Administered  . Influenza Split 01/14/2013  . Influenza, High Dose Seasonal PF 12/07/2016, 12/14/2017  . Influenza,inj,Quad PF,6+ Mos 01/01/2014, 12/21/2014, 12/19/2015  . Pneumococcal Conjugate-13 07/23/2014  . Pneumococcal Polysaccharide-23 06/25/2008, 02/26/2014   Patient is due for a tetanus shot which she declines due to cost.  She is also due for Shingrix.  I recommended that she check on this at her local pharmacy as we do not stock that vaccine.  Given her age I would not recommend a Pap smear or colonoscopy.  I would also not recommend routine mammograms although the patient still gets them.  I would however recommend a bone density as she has not had one.  She does report weakness in both of her legs.  She is using a cane now but she feels unsteady on her feet and believe she would benefit from using a walker due to the weakness in her legs.  She also has severe pain in both knees right greater than left.  She is tried cortisone injections with no benefit.  She is now receiving Visco supplementation injections with little benefit.  She denies any memory issues.  She is still extremely quickwitted and answers my question appropriately.  She is able to remember 3 objects on recall.  She is able to spell world in reverse and has no difficulty with serial sevens.  Denies any issues with depression.  She does have unsteady gait and is increased risk for falls. Past Medical History:  Diagnosis Date  . Anemia   . Atrial fibrillation ([place])   . Breast cancer ([place]) 10/2013   right upper outer  . Cancer ([place])    right breast  .  Complication of anesthesia    slow to wake up  . Diabetes mellitus without complication ([place])   . Dysrhythmia 10/15   AF  . Former smoker   . Full dentures   . Hyperlipidemia   . Hypertension   . Multinodular goiter   . Radiation    Right Breast  . Thyroid disease    hypothyroidism  . Wears glasses    Past Surgical History:  Procedure Laterality Date  . ABDOMINAL HYSTERECTOMY    . AXILLARY LYMPH NODE DISSECTION Right 01/09/2014   Procedure: RIGHT AXILLARY LYMPH NODE DISECTION;  Surgeon: [name], MD;  Location: [place];  Service: General;  Laterality: Right;  . BREAST LUMPECTOMY WITH RADIOACTIVE SEED LOCALIZATION Right 01/09/2014   Procedure: RIGHT BREAST SEED LOCALIZED LUMPECTOMY ;  Surgeon: [name], MD;  Location: [place];  Service: General;  Laterality: Right;  . EYE SURGERY  12/22/2009   cataracts  . INTRAMEDULLARY (IM) NAIL INTERTROCHANTERIC Left 02/24/2014   Procedure: IM ROD LEFT HIP FX;  Surgeon: Alta Corning, MD;  Location: [facility];  Service: Orthopedics;  Laterality: Left;  . PORT-A-CATH REMOVAL Right 01/09/2014   Procedure: REMOVAL PORT-A-CATH;  Surgeon: [name], MD;  Location: [facility];  Service: General;  Laterality: Right;  . PORTACATH PLACEMENT N/A 11/15/2013   Procedure: INSERTION PORT-A-CATH WITH ULTRA SOUND AND FLOROSCOPY;  Surgeon: [name], MD;  [facility];  Service: General;  Laterality: N/A;  . TOTAL KNEE ARTHROPLASTY  2002   left   Current Outpatient Medications on File Prior to Visit  Medication Sig Dispense Refill  . amiodarone (PACERONE) 200 MG tablet TAKE 1 TABLET BY MOUTH EVERY DAY 90 tablet 1  . atorvastatin (LIPITOR) 20 MG tablet Take 1 tablet (20 mg total) by mouth daily. 90 tablet 3  . Blood Glucose Monitoring Suppl (ONETOUCH VERIO) w/Device KIT Use to test blood sugars daily. 1 kit 0  . Carboxymeth-Glycerin-Polysorb (REFRESH OPTIVE ADVANCED OP) Place 1 drop into  both eyes 3 (three) times daily.    . diclofenac sodium (VOLTAREN) 1 % GEL Apply 2 g topically 4 (four) times daily. 1 Tube 2  . doxazosin (CARDURA) 4 MG tablet TAKE 1 TABLET BY MOUTH EVERY DAY 90 tablet 2  . ELIQUIS 5 MG TABS tablet TAKE 1 TABLET TWICE A DAY 60 tablet 10  . furosemide (LASIX) 40 MG tablet TAKE 2 TABLETS BY MOUTH TWICE A DAY 360 tablet 2  . JANUVIA 100 MG tablet TAKE 1 TABLET BY MOUTH EVERY DAY 90 tablet 1  . KLOR-CON M20 20 MEQ tablet TAKE 1 TABLET BY MOUTH EVERY DAY 90 tablet 1  . lisinopril (PRINIVIL,ZESTRIL) 20 MG tablet TAKE 1 TABLET BY MOUTH EVERY DAY 90 tablet 3  . meclizine (ANTIVERT) 12.5 MG tablet TAKE 1 TABLET BY MOUTH DAILY AS NEEDED FOR DIZZINESS 10 tablet 0  . metoprolol succinate (TOPROL-XL) 100 MG 24 hr tablet TAKE 1 TABLET BY MOUTH TWICE A DAY WITH OR IMMEDIATELY FOLLOWING A MEAL 180 tablet 1  . ONETOUCH VERIO test strip CHECK BLOOD SUGAR EVERY DAY AS DIRECTED 150 each 1  . Sennosides (EX-LAX) 15 MG TABS Take 1 tablet by mouth daily as needed (CONSTIPATION).    Marland Kitchen tamoxifen (NOLVADEX) 20 MG tablet Take 1 tablet (20 mg total) by mouth daily. 90 tablet 3   No current facility-administered medications on file prior to visit.    Allergies  Allergen Reactions  . Tape Other (See Comments)    Skin is somewhat sensitive   Social History   Socioeconomic History  . Marital status: Widowed    Spouse name: Not on file  . Number of children: 4  . Years of education: Not on file  . Highest education level: Not on file  Occupational History  . Not on file  Social Needs  . Financial resource strain: Not on file  . Food insecurity:    Worry: Not on file    Inability: Not on file  . Transportation needs:    Medical: Not on file    Non-medical: Not on file  Tobacco Use  . Smoking status: Former Smoker    Packs/day: 1.00    Years: 5.00    Pack years: 5.00    Last attempt to quit: 11/14/1958    Years since quitting: 59.4  . Smokeless tobacco: Never Used    Substance and Sexual Activity  . Alcohol use: No    Alcohol/week: 0.0 standard drinks  . Drug use: No  . Sexual activity: Never    Comment: menarche age 77, P10, first birth age 80, no HRT, menopause age 17  Lifestyle  . Physical activity:    Days per week: Not on file    Minutes per session: Not on file  . Stress: Not on file  Relationships  . Social connections:    Talks on phone: Not on file    Gets together: Not on  file    Attends religious service: Not on file    Active member of club or organization: Not on file    Attends meetings of clubs or organizations: Not on file    Relationship status: Not on file  . Intimate partner violence:    Fear of current or ex partner: Not on file    Emotionally abused: Not on file    Physically abused: Not on file    Forced sexual activity: Not on file  Other Topics Concern  . Not on file  Social History Narrative  . Not on file   No family history on file.   Review of Systems  All other systems reviewed and are negative.      Objective:   Physical Exam  Constitutional: She is oriented to person, place, and time. She appears well-developed and well-nourished. No distress.  HENT:  Head: Normocephalic and atraumatic.  Right Ear: External ear normal.  Left Ear: External ear normal.  Nose: Nose normal.  Mouth/Throat: Oropharynx is clear and moist. No oropharyngeal exudate.  Eyes: Pupils are equal, round, and reactive to light. Conjunctivae and EOM are normal. Right eye exhibits no discharge. Left eye exhibits no discharge. No scleral icterus.  Neck: Normal range of motion. Neck supple. No JVD present. No tracheal deviation present. No thyromegaly present.  Cardiovascular: Normal rate, regular rhythm and intact distal pulses. Exam reveals no gallop and no friction rub.  Murmur heard. Pulmonary/Chest: Effort normal and breath sounds normal. No stridor. No respiratory distress. She has no wheezes. She has no rales.  Abdominal: Soft.  Bowel sounds are normal. She exhibits no distension and no mass. There is no abdominal tenderness. There is no rebound and no guarding.  Musculoskeletal:        General: Edema present. No tenderness.     Right knee: She exhibits decreased range of motion and swelling. She exhibits no effusion.  Lymphadenopathy:    She has no cervical adenopathy.  Neurological: She is alert and oriented to person, place, and time. She has normal reflexes. She displays normal reflexes. No cranial nerve deficit. She exhibits normal muscle tone. Coordination normal.  Skin: Skin is warm. No rash noted. She is not diaphoretic. No erythema. No pallor.  Psychiatric: She has a normal mood and affect. Her behavior is normal. Judgment and thought content normal.  Vitals reviewed.         Assessment & Plan:  Paroxysmal atrial fibrillation (HCC) - Plan: TSH  Chronic diastolic CHF (congestive heart failure), NYHA class 2 (HCC)  Controlled type 2 diabetes mellitus with complication, without long-term current use of insulin ([place]) - Plan: CBC with Differential/Platelet, COMPLETE METABOLIC PANEL WITH GFR, Lipid panel, Hemoglobin A1c, TSH, Microalbumin, urine  Postmenopausal estrogen deficiency - Plan: DG Bone Density  General medical exam  Given the patient's knee pain due to severe osteoarthritis, her dizziness, her weakness, and her advancing age, I have recommended that she use a rolling walker with a seat to help prevent falls.  I would also recommend a bone density test to evaluate for osteoporosis and if present I would treat this to reduce her risk of unintended fracture.  Patient's immunizations are up-to-date.  I did recommend a tetanus vaccine which she politely declined.  I recommended that she check on the shingles vaccine at her local pharmacy.  I recommended against a colonoscopy, mammogram, and Pap smear.  Patient denies any difficulty with memory loss or depression.  I will check a TSH given her  history of  paroxysmal atrial fibrillation on amiodarone.  I will check a CBC to monitor for anemia given the fact she is on Eliquis.  Check a CMP and a fasting lipid panel.  Goal LDL cholesterol is less than 100 given her diabetes.  I will check a hemoglobin A1c.  I would be happy with a hemoglobin A1c less than 7.5 given her advanced age.  Regular anticipatory guidance is provided.  I did give her a prescription for the rolling walker.

## 2018-04-19 LAB — CBC WITH DIFFERENTIAL/PLATELET
Absolute Monocytes: 449 cells/uL (ref 200–950)
Basophils Absolute: 22 cells/uL (ref 0–200)
Basophils Relative: 0.5 %
Eosinophils Absolute: 70 cells/uL (ref 15–500)
Eosinophils Relative: 1.6 %
HCT: 29.2 % — ABNORMAL LOW (ref 35.0–45.0)
Hemoglobin: 9.5 g/dL — ABNORMAL LOW (ref 11.7–15.5)
Lymphs Abs: 647 cells/uL — ABNORMAL LOW (ref 850–3900)
MCH: 31 pg (ref 27.0–33.0)
MCHC: 32.5 g/dL (ref 32.0–36.0)
MCV: 95.4 fL (ref 80.0–100.0)
MPV: 10.8 fL (ref 7.5–12.5)
Monocytes Relative: 10.2 %
Neutro Abs: 3212 cells/uL (ref 1500–7800)
Neutrophils Relative %: 73 %
Platelets: 153 10*3/uL (ref 140–400)
RBC: 3.06 10*6/uL — ABNORMAL LOW (ref 3.80–5.10)
RDW: 13.2 % (ref 11.0–15.0)
Total Lymphocyte: 14.7 %
WBC: 4.4 10*3/uL (ref 3.8–10.8)

## 2018-04-19 LAB — COMPLETE METABOLIC PANEL WITH GFR
AG Ratio: 1.3 (calc) (ref 1.0–2.5)
ALT: 19 U/L (ref 6–29)
AST: 25 U/L (ref 10–35)
Albumin: 3.2 g/dL — ABNORMAL LOW (ref 3.6–5.1)
Alkaline phosphatase (APISO): 49 U/L (ref 37–153)
BUN/Creatinine Ratio: 15 (calc) (ref 6–22)
BUN: 29 mg/dL — ABNORMAL HIGH (ref 7–25)
CO2: 27 mmol/L (ref 20–32)
Calcium: 8.4 mg/dL — ABNORMAL LOW (ref 8.6–10.4)
Chloride: 105 mmol/L (ref 98–110)
Creat: 1.95 mg/dL — ABNORMAL HIGH (ref 0.60–0.88)
GFR, Est African American: 26 mL/min/{1.73_m2} — ABNORMAL LOW (ref >=60)
GFR, Est Non African American: 22 mL/min/{1.73_m2} — ABNORMAL LOW (ref >=60)
Globulin: 2.5 g/dL (calc) (ref 1.9–3.7)
Glucose, Bld: 203 mg/dL — ABNORMAL HIGH (ref 65–99)
Potassium: 4 mmol/L (ref 3.5–5.3)
Sodium: 141 mmol/L (ref 135–146)
Total Bilirubin: 0.5 mg/dL (ref 0.2–1.2)
Total Protein: 5.7 g/dL — ABNORMAL LOW (ref 6.1–8.1)

## 2018-04-19 LAB — TSH: TSH: 0.5 mIU/L (ref 0.40–4.50)

## 2018-04-19 LAB — LIPID PANEL
Cholesterol: 141 mg/dL (ref <200)
HDL: 68 mg/dL (ref >=50)
LDL Cholesterol (Calc): 59 mg/dL (calc)
Non-HDL Cholesterol (Calc): 73 mg/dL (calc) (ref <130)
Total CHOL/HDL Ratio: 2.1 (calc) (ref <5.0)
Triglycerides: 64 mg/dL (ref <150)

## 2018-04-19 LAB — MICROALBUMIN, URINE: Microalb, Ur: 6.1 mg/dL

## 2018-04-19 LAB — HEMOGLOBIN A1C
Hgb A1c MFr Bld: 6.4 % of total Hgb — ABNORMAL HIGH (ref <5.7)
Mean Plasma Glucose: 137 (calc)
eAG (mmol/L): 7.6 (calc)

## 2018-04-20 ENCOUNTER — Other Ambulatory Visit: Payer: Self-pay | Admitting: Family Medicine

## 2018-04-20 DIAGNOSIS — D649 Anemia, unspecified: Secondary | ICD-10-CM

## 2018-04-20 DIAGNOSIS — R944 Abnormal results of kidney function studies: Secondary | ICD-10-CM

## 2018-04-21 ENCOUNTER — Other Ambulatory Visit: Payer: Self-pay | Admitting: *Deleted

## 2018-04-21 MED ORDER — POTASSIUM CHLORIDE ER 10 MEQ PO TBCR: 10.0000 meq | EXTENDED_RELEASE_TABLET | Freq: Every day | ORAL | 3 refills | Status: DC

## 2018-04-21 MED ORDER — FUROSEMIDE 40 MG PO TABS: 40.0000 mg | ORAL_TABLET | Freq: Two times a day (BID) | ORAL | 2 refills | Status: DC

## 2018-04-22 ENCOUNTER — Ambulatory Visit (INDEPENDENT_AMBULATORY_CARE_PROVIDER_SITE_OTHER): Payer: Medicare Other | Admitting: Family Medicine

## 2018-04-22 ENCOUNTER — Inpatient Hospital Stay: Payer: Medicare Other | Admitting: Hematology and Oncology

## 2018-04-22 ENCOUNTER — Encounter: Payer: Self-pay | Admitting: Family Medicine

## 2018-04-22 VITALS — BP 120/70 | HR 69 | Temp 98.0°F | Resp 15 | Ht 66.5 in | Wt 177.2 lb

## 2018-04-22 DIAGNOSIS — R002 Palpitations: Secondary | ICD-10-CM

## 2018-04-22 DIAGNOSIS — D649 Anemia, unspecified: Secondary | ICD-10-CM | POA: Diagnosis not present

## 2018-04-22 NOTE — Assessment & Plan Note (Signed)
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 ER positive PR positive HER-2 negative Ki-67 11%. Patient was hospitalized for left hip fracture 02/25/2014, completed adjuvant radiation therapy 06/29/14, Started Tamoxifen 07/20/14 Hospitalization 12/30/2015: Escherichia colibacteremia.  Tamoxifen Toxicities:no toxicities to tamoxifen. Denies any hot flashes or myalgias.  Breast Cancer Surveillance: 1. Breast exam2/09/2018: no lumps or nodules or any concerns for breast cancer. 2. Mammogram  11/03/2017: No breast cancer, post-op changes density B  Leg edema:Instructed the patient to elevate her legs. She is currently on Lasix Mild chronic kidney disease: This is being monitored by her primary care physician.   RTC inone yearFor surveillance and follow-up.

## 2018-04-22 NOTE — Patient Instructions (Signed)
You look good today -   You may have some change to how you feel with recent medication changes.  Today everything looks good.  Get rechecked right away if you have these heart beat symptoms with any new shortness of breath, chest pain, feeling like you could pass out, or big increase in your swelling or weight increase more than 3-5 lbs.

## 2018-04-22 NOTE — Progress Notes (Signed)
Patient ID: Whitney [name], female    DOB: 07-17-1927, 85 y.o.   MRN: [id number]  PCP: [name], MD  Chief Complaint  Patient presents with  . Tachycardia    Patient in with c/o tacycardia after taking iron tablets.Onset today    Subjective:   Whitney [name] is a 85 y.o. female, presents to clinic with CC of feeling her heart beat more forcefully for the past day or two, since starting iron supplement pills.  She "hears it" all day long.  Recenlty saw Dr. Dennard Schaumann and lasix and potassium were reduced and she was given an iron supplement after labs resulted showing worsening renal function and decrease in hemoglobin and hematocrit.   All other medications have stayed the same.  Patient has had normal urination, has not noticed any weight increase, it is checked at home by nurses and there have not been any abnormal blood pressures respiratory rates, heart rate or weight increase - per the pt and her daughter here today.  She denies any shortness of breath, irregular heart beat, near-syncope, chest pain, orthopnea, PND.  Her LE is baseline, no worsening.  Reviewing most recent labs from December 2019 2 April 18, 2018 creatinine went from 1.72 up to 1.95 and GFR decreased from 35-26.    Hemoglobin 05/05/17 was 11.1, down to 9.5 on 04/18/2018 Pt does not recall any recent stool tests.  She denies any hematochezia or melena.  Just started iron supplement today.    Weight + 2lbs from visit 4 d ago.  Wt Readings from Last 5 Encounters:  04/22/18 177 lb 4 oz (80.4 kg)  04/18/18 175 lb (79.4 kg)  02/28/18 174 lb 6.4 oz (79.1 kg)  11/12/17 174 lb (78.9 kg)  11/05/17 174 lb (78.9 kg)     Patient Active Problem List   Diagnosis Date Noted  . Unilateral primary osteoarthritis, right knee 03/25/2018  . Chronic pain of right knee 05/25/2017  . Bacteremia 10/25/2016  . Transaminitis 10/23/2016  . Sinus bradycardia 10/23/2016  . Controlled diabetes mellitus type 2 with  complications ([place])   . Paroxysmal atrial fibrillation (HCC)   . Chronic diastolic CHF (congestive heart failure) ([place])   . Pulmonary hypertension ([place])   . Acute renal failure superimposed on stage 2 chronic kidney disease ([place])   . Elevated liver enzymes   . Bacteremia due to Escherichia coli 12/30/2015  . UTI (urinary tract infection) 12/30/2015  . Acute blood loss anemia 02/25/2014  . Closed left hip fracture ([place]) 02/24/2014  . PAF (paroxysmal atrial fibrillation) ([place]) 01/30/2014  . Anticoagulated 01/30/2014  . Chronic diastolic CHF (congestive heart failure), NYHA class 2 ([place]) 12/10/2013  . Preoperative cardiovascular examination 12/10/2013  . Neutropenic fever ([place]) 12/08/2013  . Anemia of chronic disease 12/08/2013  . Thrombocytopenia ([place]) 12/08/2013  . Hypokalemia 12/08/2013  . Pancytopenia due to antineoplastic chemotherapy ([place]) 12/08/2013  . Hypothyroidism 12/08/2013  . Breast cancer of upper-outer quadrant of right female breast ([place]) 11/01/2013  . Essential hypertension, benign 12/01/2012  . Pure hypercholesterolemia 12/01/2012  . DM2 (diabetes mellitus, type 2) ([place]) 11/21/2012     Prior to Admission medications   Medication Sig Start Date End Date Taking? Authorizing Provider  amiodarone (PACERONE) 200 MG tablet TAKE 1 TABLET BY MOUTH EVERY DAY 12/30/17  Yes [name], MD  atorvastatin (LIPITOR) 20 MG tablet Take 1 tablet (20 mg total) by mouth daily. 07/14/17  Yes [name], MD  Blood Glucose Monitoring Suppl [facility]  VERIO) w/Device KIT Use to test blood sugars daily. 11/23/17  Yes Elayne Snare, MD  Carboxymeth-Glycerin-Polysorb (REFRESH OPTIVE ADVANCED OP) Place 1 drop into both eyes 3 (three) times daily.   Yes [provider]  diclofenac sodium (VOLTAREN) 1 % GEL Apply 2 g topically 4 (four) times daily. 03/25/18  Yes Aundra Dubin, PA-C  doxazosin (CARDURA) 4 MG tablet TAKE 1 TABLET BY MOUTH EVERY DAY 12/27/17  Yes [name],  MD  ELIQUIS 5 MG TABS tablet TAKE 1 TABLET TWICE A DAY 09/14/17  Yes [name], MD  ferrous sulfate 325 (65 FE) MG tablet Take 325 mg by mouth daily with breakfast.   Yes [provider]  furosemide (LASIX) 40 MG tablet Take 1 tablet (40 mg total) by mouth 2 (two) times daily. 04/21/18  Yes [name], MD  JANUVIA 100 MG tablet TAKE 1 TABLET BY MOUTH EVERY DAY 12/09/17  Yes [name], MD  KLOR-CON M20 20 MEQ tablet TAKE 1 TABLET BY MOUTH EVERY DAY 01/12/18  Yes Elayne Snare, MD  lisinopril (PRINIVIL,ZESTRIL) 20 MG tablet TAKE 1 TABLET BY MOUTH EVERY DAY 11/22/17  Yes [name], MD  meclizine (ANTIVERT) 12.5 MG tablet TAKE 1 TABLET BY MOUTH DAILY AS NEEDED FOR DIZZINESS 04/15/18  Yes [name], MD  metoprolol succinate (TOPROL-XL) 100 MG 24 hr tablet TAKE 1 TABLET BY MOUTH TWICE A DAY WITH OR IMMEDIATELY FOLLOWING A MEAL 03/01/18  Yes Elayne Snare, MD  [facility] VERIO test strip CHECK BLOOD SUGAR EVERY DAY AS DIRECTED 12/20/17  Yes Elayne Snare, MD  potassium chloride (K-DUR) 10 MEQ tablet Take 1 tablet (10 mEq total) by mouth daily. 04/21/18  Yes [name], MD  Sennosides (EX-LAX) 15 MG TABS Take 1 tablet by mouth daily as needed (CONSTIPATION).   Yes [provider]  tamoxifen (NOLVADEX) 20 MG tablet Take 1 tablet (20 mg total) by mouth daily. 04/20/17  Yes Nicholas Lose, MD     Allergies  Allergen Reactions  . Tape Other (See Comments)    Skin is somewhat sensitive     No family history on file.   Social History   Socioeconomic History  . Marital status: Widowed    Spouse name: Not on file  . Number of children: 4  . Years of education: Not on file  . Highest education level: Not on file  Occupational History  . Not on file  Social Needs  . Financial resource strain: Not on file  . Food insecurity:    Worry: Not on file    Inability: Not on file  . Transportation needs:    Medical: Not on file    Non-medical: Not on file    Tobacco Use  . Smoking status: Former Smoker    Packs/day: 1.00    Years: 5.00    Pack years: 5.00    Last attempt to quit: 11/14/1958    Years since quitting: 59.4  . Smokeless tobacco: Never Used  Substance and Sexual Activity  . Alcohol use: No    Alcohol/week: 0.0 standard drinks  . Drug use: No  . Sexual activity: Never    Comment: menarche age 6, P85, first birth age 11, no HRT, menopause age 37  Lifestyle  . Physical activity:    Days per week: Not on file    Minutes per session: Not on file  . Stress: Not on file  Relationships  . Social connections:    Talks on  phone: Not on file    Gets together: Not on file    Attends religious service: Not on file    Active member of club or organization: Not on file    Attends meetings of clubs or organizations: Not on file    Relationship status: Not on file  . Intimate partner violence:    Fear of current or ex partner: Not on file    Emotionally abused: Not on file    Physically abused: Not on file    Forced sexual activity: Not on file  Other Topics Concern  . Not on file  Social History Narrative  . Not on file     Review of Systems  Constitutional: Negative.   HENT: Negative.   Eyes: Negative.   Respiratory: Negative.   Cardiovascular: Negative.   Gastrointestinal: Negative.   Endocrine: Negative.   Genitourinary: Negative.   Musculoskeletal: Negative.   Skin: Negative.   Allergic/Immunologic: Negative.   Neurological: Negative.   Hematological: Negative.   Psychiatric/Behavioral: Negative.   All other systems reviewed and are negative.      Objective:    Vitals:   04/22/18 0955  BP: 120/70  Pulse: 69  Resp: 15  Temp: 98 F (36.7 C)  TempSrc: Oral  SpO2: 97%  Weight: 177 lb 4 oz (80.4 kg)  Height: 5' 6.5" (1.689 m)      Physical Exam Vitals signs and nursing note reviewed.  Constitutional:      General: She is not in acute distress.    Appearance: She is well-developed. She is not  ill-appearing, toxic-appearing or diaphoretic.     Comments: Pleasant, well appearing elderly female, NAD  HENT:     Head: Normocephalic and atraumatic.     Right Ear: There is no impacted cerumen.     Left Ear: There is no impacted cerumen.     Nose: Nose normal. No congestion.     Mouth/Throat:     Mouth: Mucous membranes are moist.     Pharynx: Oropharynx is clear.  Eyes:     General: No scleral icterus.       Right eye: No discharge.        Left eye: No discharge.     Conjunctiva/sclera: Conjunctivae normal.     Pupils: Pupils are equal, round, and reactive to light.  Neck:     Musculoskeletal: Normal range of motion.     Trachea: No tracheal deviation.  Cardiovascular:     Rate and Rhythm: Normal rate and regular rhythm.     Pulses: Normal pulses.     Heart sounds: Murmur present. No friction rub. No gallop.   Pulmonary:     Effort: Pulmonary effort is normal. No respiratory distress.     Breath sounds: Normal breath sounds. No stridor. No wheezing, rhonchi or rales.  Chest:     Chest wall: No tenderness.  Abdominal:     General: Abdomen is flat. Bowel sounds are normal. There is no distension.     Palpations: Abdomen is soft. There is no mass.     Tenderness: There is no abdominal tenderness.  Musculoskeletal: Normal range of motion.     Right lower leg: Edema present.     Left lower leg: Edema present.  Skin:    General: Skin is warm and dry.     Capillary Refill: Capillary refill takes less than 2 seconds.     Coloration: Skin is not jaundiced or pale.     Findings: No erythema  or rash.  Neurological:     Mental Status: She is alert.     Motor: No abnormal muscle tone.     Coordination: Coordination normal.  Psychiatric:        Mood and Affect: Mood normal.        Behavior: Behavior normal.     EKG:  NSR, HR 65     Assessment & Plan:      ICD-10-CM   1. Palpitations R00.2 EKG 12-Lead  2. Anemia, unspecified type D64.9 Fecal Globin By Immunochemistry     Patient is a 85 year old female presents with feeling a stronger more forceful heartbeat over the past 1 to 2 days she states this is seemed to occur since she started oral iron supplements and she also had a few medication changes few days prior to that, her Lasix and potassium were decreased due to worsening kidney function.  Today she appears euvolemic, weight has only gone up by 2 pounds since her visit 4 days ago.  I do hear a murmur.  EKG is reassuring normal sinus rhythm rate of 65, no PVCs or arrhythmias looks unchanged from most recent EKG 10/24/16.    Scanned in the chart I do not see Hemoccult so patient was given a take-home test was encouraged to hold iron supplement for a few days do the test and return to clinic and then she could restart oral iron supplement, did encourage her to spread out dosing if she has GI symptoms constipation bloating nausea etc.  Patient may have some increased feeling in her chest of the contraction of her heart due to the change in her Lasix she may have slightly more fluid or slightly higher blood pressure because of this and that may be causing some of her symptoms.  We clearly reviewed symptoms of CHF, she understands to watch her weights carefully, her daughter is present with her today also verbalizes understanding of concerning signs and symptoms to get seen immediately for.  Really patient does not have any other signs of fluid overload or CHF she has no orthopnea, PND, exertional dyspnea, chest pain.  F/up with PCP as needed. Labs reviewed from 04/18/2018 - recently done with the PCP visit -no electrolyte derangements, hgb 9.5, down from 11.1 one year ago 05/05/17 - appeared to be her baseline.  Hx of iron deficiency anemia unspecified last time I see an iron panel done was in 2018  Worsening anemia may be secondary to worsening chronic kidney disease, hypothyroid?  Pt denies melena/hematochezia, would like Hemoccult to rule it out.  Aged out of colonoscopy  screening.    Discussed close monitoring of fluid status - we discussed the delicate balance of HF and CHF and closely paying attention to sx, weight, LE edema, urine output etc, and to follow up closely with PCP as needed.    Delsa Grana, PA-C 04/22/18 10:21 AM

## 2018-04-25 ENCOUNTER — Other Ambulatory Visit: Payer: Medicare Other

## 2018-04-25 DIAGNOSIS — D649 Anemia, unspecified: Secondary | ICD-10-CM | POA: Diagnosis not present

## 2018-04-26 LAB — FECAL GLOBIN BY IMMUNOCHEMISTRY
FECAL GLOBIN RESULT:: NOT DETECTED
MICRO NUMBER:: 173051
SPECIMEN QUALITY:: ADEQUATE

## 2018-04-27 ENCOUNTER — Encounter: Payer: Self-pay | Admitting: Family Medicine

## 2018-05-02 ENCOUNTER — Other Ambulatory Visit: Payer: Medicare Other

## 2018-05-02 DIAGNOSIS — R944 Abnormal results of kidney function studies: Secondary | ICD-10-CM | POA: Diagnosis not present

## 2018-05-02 DIAGNOSIS — D649 Anemia, unspecified: Secondary | ICD-10-CM | POA: Diagnosis not present

## 2018-05-04 LAB — CBC WITH DIFFERENTIAL/PLATELET
Absolute Monocytes: 324 cells/uL (ref 200–950)
Basophils Absolute: 21 cells/uL (ref 0–200)
Basophils Relative: 0.5 %
Eosinophils Absolute: 62 cells/uL (ref 15–500)
Eosinophils Relative: 1.5 %
HCT: 30 % — ABNORMAL LOW (ref 35.0–45.0)
Hemoglobin: 9.7 g/dL — ABNORMAL LOW (ref 11.7–15.5)
Lymphs Abs: 726 cells/uL — ABNORMAL LOW (ref 850–3900)
MCH: 30.4 pg (ref 27.0–33.0)
MCHC: 32.3 g/dL (ref 32.0–36.0)
MCV: 94 fL (ref 80.0–100.0)
MPV: 11.1 fL (ref 7.5–12.5)
Monocytes Relative: 7.9 %
Neutro Abs: 2968 cells/uL (ref 1500–7800)
Neutrophils Relative %: 72.4 %
Platelets: 165 10*3/uL (ref 140–400)
RBC: 3.19 10*6/uL — ABNORMAL LOW (ref 3.80–5.10)
RDW: 13.3 % (ref 11.0–15.0)
Total Lymphocyte: 17.7 %
WBC: 4.1 10*3/uL (ref 3.8–10.8)

## 2018-05-04 LAB — TEST AUTHORIZATION

## 2018-05-04 LAB — BASIC METABOLIC PANEL
BUN/Creatinine Ratio: 15 (calc) (ref 6–22)
BUN: 23 mg/dL (ref 7–25)
CO2: 27 mmol/L (ref 20–32)
Calcium: 8.5 mg/dL — ABNORMAL LOW (ref 8.6–10.4)
Chloride: 107 mmol/L (ref 98–110)
Creat: 1.52 mg/dL — ABNORMAL HIGH (ref 0.60–0.88)
Glucose, Bld: 161 mg/dL — ABNORMAL HIGH (ref 65–99)
Potassium: 4.3 mmol/L (ref 3.5–5.3)
Sodium: 141 mmol/L (ref 135–146)

## 2018-05-04 LAB — IRON: Iron: 56 ug/dL (ref 45–160)

## 2018-05-05 ENCOUNTER — Encounter: Payer: Self-pay | Admitting: Family Medicine

## 2018-05-06 ENCOUNTER — Inpatient Hospital Stay: Payer: Medicare Other | Admitting: Hematology and Oncology

## 2018-05-20 ENCOUNTER — Ambulatory Visit: Payer: Medicare Other | Admitting: Hematology and Oncology

## 2018-05-26 NOTE — Progress Notes (Signed)
Patient Care Team: [name], MD as PCP - General (Family Medicine) Nicholas Lose, MD as Consulting Physician (Hematology and Oncology) [name], MD as Consulting Physician (General Surgery)  DIAGNOSIS:    ICD-10-CM   1. Malignant neoplasm of upper-outer quadrant of right breast in female, estrogen receptor positive ([place]) C50.411    Z17.0     SUMMARY OF ONCOLOGIC HISTORY:   Breast cancer of upper-outer quadrant of right female breast ([place])   10/20/2013 Mammogram    Ultrasound and mammogram showed 2.1 x 2.1 x 1.9 cm right breast mass    10/20/2013 Initial Diagnosis    Breast cancer of upper-outer quadrant of right female breast. Invasive ductal cancer with lymphovascular invasion one lymph node biopsy that was positive for cancer grade 1; Her 2 Neg Ratio 0.96, ER100% PR 8% positive Ki 67: 11%;    10/31/2013 Breast MRI    Right breast upper outer quadrant: 3.1 x 1.9 x 2.2 cm: Right axillary lymph node 1.1 x 0.9 x 0.6 cm    11/16/2013 - 12/01/2013 Chemotherapy    Neoadjuvant dose dense Doxorubicin and Cyclophosphamide given on day 1 of a 14 day cycle with Neulasta given on day 2 for granulocyte support    01/09/2014 Surgery    Right breast lumpectomy: Invasive ductal carcinoma, grade 2, 1.8 cm with DCIS intermediate grade, lymphovascular invasion diffusely, 5 of 13 lymph nodes positive with extracapsular extension, ER positive, PR 80%, HER-2 negative ratio 0.96, Ki-67 11%    02/25/2014 - 02/28/2014 Hospital Admission    Left hip fracture as a result of a fall at home    05/15/2014 - 06/29/2014 Radiation Therapy    Adjuvant radiation therapy with Dr. Pablo Ledger    07/20/2014 -  Anti-estrogen oral therapy    Tamoxifen 20 mg daily     CHIEF COMPLIANT: Follow-up of tamoxifen therapy  INTERVAL HISTORY: Whitney [name] is a 85 y.o. with above-mentioned history of left breast cancer currently on adjuvant tamoxifen therapy. I last saw her one year ago. Her most recent mammogram  on 11/03/17 showed no evidence of malignancy. She presents to the clinic today for annual follow-up on tamoxifen therapy.  She is tolerating it extremely well.  She does not have any side effects.  Denies any hot flashes or arthralgias or myalgias.  Denies any lumps or nodules in the breast.  She has noticed worsening swelling of the right arm and she has started wearing the sleeve.  REVIEW OF SYSTEMS:   Constitutional: Denies fevers, chills or abnormal weight loss Eyes: Denies blurriness of vision Ears, nose, mouth, throat, and face: Denies mucositis or sore throat Respiratory: Denies cough, dyspnea or wheezes Cardiovascular: Denies palpitation, chest discomfort Gastrointestinal: Denies nausea, heartburn or change in bowel habits Skin: Denies abnormal skin rashes Lymphatics: Denies new lymphadenopathy or easy bruising Neurological: Denies numbness, tingling or new weaknesses Behavioral/Psych: Mood is stable, no new changes  Extremities: No lower extremity edema, right arm swelling and lymphedema Breast: denies any pain or lumps or nodules in either breasts All other systems were reviewed with the patient and are negative.  I have reviewed the past medical history, past surgical history, social history and family history with the patient and they are unchanged from previous note.  ALLERGIES:  is allergic to tape.  MEDICATIONS:  Current Outpatient Medications  Medication Sig Dispense Refill  . amiodarone (PACERONE) 200 MG tablet TAKE 1 TABLET BY MOUTH EVERY DAY 90 tablet 1  . atorvastatin (LIPITOR) 20 MG tablet Take  1 tablet (20 mg total) by mouth daily. 90 tablet 3  . Blood Glucose Monitoring Suppl (ONETOUCH VERIO) w/Device KIT Use to test blood sugars daily. 1 kit 0  . Carboxymeth-Glycerin-Polysorb (REFRESH OPTIVE ADVANCED OP) Place 1 drop into both eyes 3 (three) times daily.    . diclofenac sodium (VOLTAREN) 1 % GEL Apply 2 g topically 4 (four) times daily. 1 Tube 2  . doxazosin  (CARDURA) 4 MG tablet TAKE 1 TABLET BY MOUTH EVERY DAY 90 tablet 2  . ELIQUIS 5 MG TABS tablet TAKE 1 TABLET TWICE A DAY 60 tablet 10  . ferrous sulfate 325 (65 FE) MG tablet Take 325 mg by mouth daily with breakfast.    . furosemide (LASIX) 40 MG tablet Take 1 tablet (40 mg total) by mouth 2 (two) times daily. 180 tablet 2  . JANUVIA 100 MG tablet TAKE 1 TABLET BY MOUTH EVERY DAY 90 tablet 1  . lisinopril (PRINIVIL,ZESTRIL) 20 MG tablet TAKE 1 TABLET BY MOUTH EVERY DAY 90 tablet 3  . meclizine (ANTIVERT) 12.5 MG tablet TAKE 1 TABLET BY MOUTH DAILY AS NEEDED FOR DIZZINESS 10 tablet 0  . metoprolol succinate (TOPROL-XL) 100 MG 24 hr tablet TAKE 1 TABLET BY MOUTH TWICE A DAY WITH OR IMMEDIATELY FOLLOWING A MEAL 180 tablet 1  . ONETOUCH VERIO test strip CHECK BLOOD SUGAR EVERY DAY AS DIRECTED 150 each 1  . potassium chloride (K-DUR) 10 MEQ tablet Take 1 tablet (10 mEq total) by mouth daily. 90 tablet 3  . Sennosides (EX-LAX) 15 MG TABS Take 1 tablet by mouth daily as needed (CONSTIPATION).    Marland Kitchen tamoxifen (NOLVADEX) 20 MG tablet Take 1 tablet (20 mg total) by mouth daily. 90 tablet 3   No current facility-administered medications for this visit.     PHYSICAL EXAMINATION: ECOG PERFORMANCE STATUS: 1 - Symptomatic but completely ambulatory  Vitals:   05/27/18 1124  BP: (!) 153/72  Pulse: (!) 57  Resp: 16  Temp: (!) 97.5 F (36.4 C)  SpO2: 100%   Filed Weights   05/27/18 1124  Weight: 176 lb 3.2 oz (79.9 kg)    GENERAL: alert, no distress and comfortable SKIN: skin color, texture, turgor are normal, no rashes or significant lesions EYES: normal, Conjunctiva are pink and non-injected, sclera clear OROPHARYNX: no exudate, no erythema and lips, buccal mucosa, and tongue normal  NECK: supple, thyroid normal size, non-tender, without nodularity LYMPH: no palpable lymphadenopathy in the cervical, axillary or inguinal LUNGS: clear to auscultation and percussion with normal breathing effort  HEART: regular rate & rhythm and no murmurs and no lower extremity edema ABDOMEN: abdomen soft, non-tender and normal bowel sounds MUSCULOSKELETAL: no cyanosis of digits and no clubbing  NEURO: alert & oriented x 3 with fluent speech, no focal motor/sensory deficits EXTREMITIES: No lower extremity edema BREAST: No palpable masses or nodules in either right or left breasts. No palpable axillary supraclavicular or infraclavicular adenopathy no breast tenderness or nipple discharge.  Right arm swelling lymphedema (exam performed in the presence of a chaperone)  LABORATORY DATA:  I have reviewed the data as listed CMP Latest Ref Rng & Units 05/02/2018 04/18/2018 02/28/2018  Glucose 65 - 99 mg/dL 161(H) 203(H) 186(H)  BUN 7 - 25 mg/dL 23 29(H) 34(H)  Creatinine 0.60 - 0.88 mg/dL 1.52(H) 1.95(H) 1.72(H)  Sodium 135 - 146 mmol/L 141 141 139  Potassium 3.5 - 5.3 mmol/L 4.3 4.0 3.6  Chloride 98 - 110 mmol/L 107 105 104  CO2 20 - 32  mmol/L 27 27 28   Calcium 8.6 - 10.4 mg/dL 8.5(L) 8.4(L) 8.6  Total Protein 6.1 - 8.1 g/dL - 5.7(L) 6.0  Total Bilirubin 0.2 - 1.2 mg/dL - 0.5 0.6  Alkaline Phos 39 - 117 U/L - - 41  AST 10 - 35 U/L - 25 29  ALT 6 - 29 U/L - 19 25    Lab Results  Component Value Date   WBC 4.1 05/02/2018   HGB 9.7 (L) 05/02/2018   HCT 30.0 (L) 05/02/2018   MCV 94.0 05/02/2018   PLT 165 05/02/2018   NEUTROABS 2,968 05/02/2018    ASSESSMENT & PLAN:  Breast cancer of upper-outer quadrant of right female breast ([place]) T1 CN2A M0 stage IIIa invasive ductal carcinoma right breast status post lumpectomy 5/13 lymph nodes positive with lymphovascular invasion and extracapsular extension. ER positive PR positive HER-2 negative Ki-67 11%. Patient was hospitalized for left hip fracture 02/25/2014, completed adjuvant radiation therapy 06/29/14, Started Tamoxifen 07/20/14 Hospitalization 12/30/2015: Escherichia colibacteremia.  Tamoxifen Toxicities:no toxicities to tamoxifen. Denies any hot  flashes or myalgias.  Breast Cancer Surveillance: 1. Breast exam3/13/2020: no lumps or nodules or any concerns for breast cancer. 2. Mammogram  11/03/2017: No breast cancer, post-op changes density B  Right arm lymphedema: Currently wearing the sleeve Mild chronic kidney disease: This is being monitored by her primary care physician.  RTC inone yearFor surveillance and follow-up.  No orders of the defined types were placed in this encounter.  The patient has a good understanding of the overall plan. she agrees with it. she will call with any problems that may develop before the next visit here.  Nicholas Lose, MD 05/27/2018  Julious Oka Dorshimer am acting as scribe for Dr. Nicholas Lose.  I have reviewed the above documentation for accuracy and completeness, and I agree with the above.

## 2018-05-27 ENCOUNTER — Other Ambulatory Visit: Payer: Self-pay

## 2018-05-27 ENCOUNTER — Inpatient Hospital Stay: Payer: Medicare Other | Attending: Hematology and Oncology | Admitting: Hematology and Oncology

## 2018-05-27 ENCOUNTER — Telehealth: Payer: Self-pay | Admitting: Hematology and Oncology

## 2018-05-27 DIAGNOSIS — Z79810 Long term (current) use of selective estrogen receptor modulators (SERMs): Secondary | ICD-10-CM | POA: Insufficient documentation

## 2018-05-27 DIAGNOSIS — C50411 Malignant neoplasm of upper-outer quadrant of right female breast: Secondary | ICD-10-CM

## 2018-05-27 DIAGNOSIS — Z9221 Personal history of antineoplastic chemotherapy: Secondary | ICD-10-CM

## 2018-05-27 DIAGNOSIS — N182 Chronic kidney disease, stage 2 (mild): Secondary | ICD-10-CM | POA: Diagnosis not present

## 2018-05-27 DIAGNOSIS — Z791 Long term (current) use of non-steroidal anti-inflammatories (NSAID): Secondary | ICD-10-CM | POA: Diagnosis not present

## 2018-05-27 DIAGNOSIS — Z79899 Other long term (current) drug therapy: Secondary | ICD-10-CM | POA: Diagnosis not present

## 2018-05-27 DIAGNOSIS — I890 Lymphedema, not elsewhere classified: Secondary | ICD-10-CM | POA: Diagnosis not present

## 2018-05-27 DIAGNOSIS — Z923 Personal history of irradiation: Secondary | ICD-10-CM | POA: Diagnosis not present

## 2018-05-27 DIAGNOSIS — Z170 Estrogen receptor positive status [ER+]: Secondary | ICD-10-CM | POA: Diagnosis not present

## 2018-05-27 DIAGNOSIS — Z7901 Long term (current) use of anticoagulants: Secondary | ICD-10-CM

## 2018-05-27 MED ORDER — TAMOXIFEN CITRATE 20 MG PO TABS: 20.0000 mg | ORAL_TABLET | Freq: Every day | ORAL | 3 refills | Status: DC

## 2018-05-27 NOTE — Assessment & Plan Note (Signed)
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 ER positive PR positive HER-2 negative Ki-67 11%. Patient was hospitalized for left hip fracture 02/25/2014, completed adjuvant radiation therapy 06/29/14, Started Tamoxifen 07/20/14 Hospitalization 12/30/2015: Escherichia colibacteremia.  Tamoxifen Toxicities:no toxicities to tamoxifen. Denies any hot flashes or myalgias.  Breast Cancer Surveillance: 1. Breast exam3/13/2020: no lumps or nodules or any concerns for breast cancer. 2. Mammogram  11/03/2017: No breast cancer, post-op changes density B  Leg edema:Instructed the patient to elevate her legs. She is currently on Lasix Mild chronic kidney disease: This is being monitored by her primary care physician.  RTC inone yearFor surveillance and follow-up.

## 2018-05-27 NOTE — Telephone Encounter (Signed)
Gave avs and calendar ° °

## 2018-05-29 ENCOUNTER — Other Ambulatory Visit (INDEPENDENT_AMBULATORY_CARE_PROVIDER_SITE_OTHER): Payer: Self-pay | Admitting: Physician Assistant

## 2018-05-31 ENCOUNTER — Ambulatory Visit (INDEPENDENT_AMBULATORY_CARE_PROVIDER_SITE_OTHER): Payer: Medicare Other | Admitting: Family Medicine

## 2018-05-31 ENCOUNTER — Other Ambulatory Visit: Payer: Self-pay

## 2018-05-31 ENCOUNTER — Encounter: Payer: Self-pay | Admitting: Family Medicine

## 2018-05-31 VITALS — BP 136/80 | HR 62 | Temp 97.7°F | Resp 16 | Ht 66.5 in | Wt 174.0 lb

## 2018-05-31 DIAGNOSIS — M545 Low back pain, unspecified: Secondary | ICD-10-CM

## 2018-05-31 DIAGNOSIS — M25552 Pain in left hip: Secondary | ICD-10-CM | POA: Diagnosis not present

## 2018-05-31 NOTE — Progress Notes (Signed)
Subjective:    Patient ID: Whitney [name], female    DOB: 05/09/1927, 85 y.o.   MRN: [id number]  HPI Patient presents with 1-monthhistory of low back pain.  The pain is located primarily on the lower left side roughly at the level of L4-L5 just above the level of the iliac crest.  She states that it feels fine when she is sitting however with standing, the pain gets worse.  It hurts as long she is walking.  She denies any sciatica radiating down her legs.  She originally complained of hip pain however the pain seems to be very high compared to her hip.  She has no pain with passive flexion of the hip.  She has no pain with passive internal or external rotation of the left hip.  She has no tenderness to palpation over the greater trochanter.  She does have some tenderness to palpation in the lumbar paraspinal muscles on the left-hand side.  She denies any falls or injuries.  She is unable to tolerate NSAIDs due to her history of A. fib and chronic kidney disease Past Medical History:  Diagnosis Date   Anemia    Atrial fibrillation [place]    [place] [place]   right upper outer   [place]    [place]   Complication of anesthesia    slow to wake up   Diabetes mellitus without complication ([facility]    Dysrhythmia 10/15   AF   Former smoker    Full dentures    Hyperlipidemia    Hypertension    Multinodular goiter    Radiation    Right Breast   Thyroid disease    hypothyroidism   Wears glasses    Past Surgical History:  Procedure Laterality Date   ABDOMINAL HYSTERECTOMY     AXILLARY LYMPH NODE DISSECTION Right 01/09/2014   Procedure: RIGHT AXILLARY LYMPH NODE DISECTION;  Surgeon: TErroll Luna MD;  [facility]  Service: General;  Laterality: Right;   BREAST LUMPECTOMY WITH RADIOACTIVE SEED LOCALIZATION Right 01/09/2014   Procedure: RIGHT BREAST SEED LOCALIZED LUMPECTOMY ;  Surgeon: TErroll Luna MD;  [facility]  Service: General;  Laterality: Right;   EYE SURGERY  12/22/2009   cataracts   INTRAMEDULLARY (IM) NAIL INTERTROCHANTERIC Left 02/24/2014   Procedure: IM ROD LEFT HIP FX;  Surgeon: JAlta Corning MD;  [facility] [facility]  Service: Orthopedics;  Laterality: Left;   PORT-A-CATH REMOVAL Right 01/09/2014   Procedure: REMOVAL PORT-A-CATH;  Surgeon: TErroll Luna MD;  [facility]  Service: General;  Laterality: Right;   PORTACATH PLACEMENT N/A 11/15/2013   Procedure: INSERTION PORT-A-CATH WITH ULTRA SOUND AND FLOROSCOPY;  Surgeon: TErroll Luna MD;  [facility]  Service: General;  Laterality: N/A;   TOTAL KNEE ARTHROPLASTY  2002   left   Current Outpatient Medications on File Prior to Visit  Medication Sig Dispense Refill   amiodarone (PACERONE) 200 MG tablet TAKE 1 TABLET BY MOUTH EVERY DAY 90 tablet 1   atorvastatin (LIPITOR) 20 MG tablet Take 1 tablet (20 mg total) by mouth daily. 90 tablet 3   Blood Glucose Monitoring Suppl (ONETOUCH VERIO) w/Device KIT Use to test blood sugars daily. 1 kit 0   Carboxymeth-Glycerin-Polysorb (REFRESH OPTIVE ADVANCED OP) Place 1 drop into both eyes 3 (three) times daily.     diclofenac sodium (VOLTAREN) 1 % GEL APPLY 2 GRAMS TO AFFECTED AREA 4 TIMES A DAY  100 g 2   doxazosin (CARDURA) 4 MG tablet TAKE 1 TABLET BY MOUTH EVERY DAY 90 tablet 2   ELIQUIS 5 MG TABS tablet TAKE 1 TABLET TWICE A DAY 60 tablet 10   ferrous sulfate 325 (65 FE) MG tablet Take 325 mg by mouth daily with breakfast.     furosemide (LASIX) 40 MG tablet Take 1 tablet (40 mg total) by mouth 2 (two) times daily. 180 tablet 2   JANUVIA 100 MG tablet TAKE 1 TABLET BY MOUTH EVERY DAY 90 tablet 1   lisinopril (PRINIVIL,ZESTRIL) 20 MG tablet TAKE 1 TABLET BY MOUTH EVERY DAY 90 tablet 3   meclizine (ANTIVERT) 12.5 MG tablet TAKE 1 TABLET BY MOUTH DAILY AS NEEDED FOR DIZZINESS 10 tablet 0   metoprolol succinate (TOPROL-XL) 100 MG 24 hr  tablet TAKE 1 TABLET BY MOUTH TWICE A DAY WITH OR IMMEDIATELY FOLLOWING A MEAL 180 tablet 1   ONETOUCH VERIO test strip CHECK BLOOD SUGAR EVERY DAY AS DIRECTED 150 each 1   potassium chloride (K-DUR) 10 MEQ tablet Take 1 tablet (10 mEq total) by mouth daily. 90 tablet 3   Sennosides (EX-LAX) 15 MG TABS Take 1 tablet by mouth daily as needed (CONSTIPATION).     tamoxifen (NOLVADEX) 20 MG tablet Take 1 tablet (20 mg total) by mouth daily. 90 tablet 3   No current facility-administered medications on file prior to visit.    Allergies  Allergen Reactions   Tape Other (See Comments)    Skin is somewhat sensitive   Social History   Socioeconomic History   Marital status: Widowed    Spouse name: Not on file   Number of children: 4   Years of education: Not on file   Highest education level: Not on file  Occupational History   Not on file  Social Needs   Financial resource strain: Not on file   Food insecurity:    Worry: Not on file    Inability: Not on file   Transportation needs:    Medical: Not on file    Non-medical: Not on file  Tobacco Use   Smoking status: Former Smoker    Packs/day: 1.00    Years: 5.00    Pack years: 5.00    Last attempt to quit: 11/14/1958    Years since quitting: 59.5   Smokeless tobacco: Never Used  Substance and Sexual Activity   Alcohol use: No    Alcohol/week: 0.0 standard drinks   Drug use: No   Sexual activity: Never    Comment: menarche age 44, P67, first birth age 36, no HRT, menopause age 55  Lifestyle   Physical activity:    Days per week: Not on file    Minutes per session: Not on file   Stress: Not on file  Relationships   Social connections:    Talks on phone: Not on file    Gets together: Not on file    Attends religious service: Not on file    Active member of club or organization: Not on file    Attends meetings of clubs or organizations: Not on file    Relationship status: Not on file   Intimate partner  violence:    Fear of current or ex partner: Not on file    Emotionally abused: Not on file    Physically abused: Not on file    Forced sexual activity: Not on file  Other Topics Concern   Not on file  Social History Narrative  Not on file   No family history on file.   Review of Systems  All other systems reviewed and are negative.      Objective:   Physical Exam  Constitutional: She appears well-developed and well-nourished. No distress.  Cardiovascular: Normal rate, regular rhythm and intact distal pulses. Exam reveals no gallop and no friction rub.  Murmur heard. Pulmonary/Chest: Effort normal and breath sounds normal. No respiratory distress. She has no wheezes. She has no rales.  Abdominal: Soft. Bowel sounds are normal.  Musculoskeletal:     Left hip: She exhibits normal range of motion, normal strength, no tenderness and no bony tenderness.     Lumbar back: She exhibits decreased range of motion, tenderness and pain. She exhibits no bony tenderness and no spasm.       Back:  Skin: She is not diaphoretic.  Vitals reviewed.         Assessment & Plan:  Low back pain without sciatica, unspecified back pain laterality, unspecified chronicity - Plan: DG Lumbar Spine Complete  Left hip pain - Plan: DG Hip Unilat W OR W/O Pelvis 2-3 Views Left  I suspect that this is a muscle in her lower back that she is injured 2 months ago.  May need to consider physical therapy and a muscle relaxer.  However I would start by obtaining x-rays of the lower back as well as her left hip.  If x-rays are normal or show degenerative disc disease, I would recommend trying a low-dose muscle relaxer and physical therapy to alleviate her pain given the 20-monthduration and symptoms.  Await the results of the x-ray.  She is unable to take NSAIDs due to chronic kidney disease and chronic anticoagulant use

## 2018-06-02 ENCOUNTER — Ambulatory Visit
Admission: RE | Admit: 2018-06-02 | Discharge: 2018-06-02 | Disposition: A | Discharge status: Home / Self Care | Payer: Medicare Other | Source: Ambulatory Visit | Attending: Family Medicine | Admitting: Family Medicine

## 2018-06-02 DIAGNOSIS — M545 Low back pain, unspecified: Secondary | ICD-10-CM

## 2018-06-02 DIAGNOSIS — M25552 Pain in left hip: Secondary | ICD-10-CM

## 2018-06-06 ENCOUNTER — Other Ambulatory Visit: Payer: Self-pay | Admitting: Family Medicine

## 2018-06-06 MED ORDER — SITAGLIPTIN PHOSPHATE 100 MG PO TABS: 100.0000 mg | ORAL_TABLET | Freq: Every day | ORAL | 1 refills | Status: DC

## 2018-06-07 ENCOUNTER — Other Ambulatory Visit: Payer: Self-pay | Admitting: Family Medicine

## 2018-06-07 MED ORDER — TRAMADOL HCL 50 MG PO TABS: 50.0000 mg | ORAL_TABLET | Freq: Three times a day (TID) | ORAL | 0 refills | Status: DC | PRN

## 2018-06-07 NOTE — Telephone Encounter (Signed)
Please send Tramadol for her back - I did qty of 60 - not sure how many you wanted to give her!?!

## 2018-06-27 ENCOUNTER — Ambulatory Visit: Payer: Medicare Other | Admitting: Endocrinology

## 2018-06-27 ENCOUNTER — Encounter: Payer: Self-pay | Admitting: Endocrinology

## 2018-06-27 ENCOUNTER — Other Ambulatory Visit: Payer: Self-pay

## 2018-06-27 VITALS — BP 126/82 | HR 87 | Ht 66.5 in | Wt 167.0 lb

## 2018-06-27 DIAGNOSIS — E042 Nontoxic multinodular goiter: Secondary | ICD-10-CM

## 2018-06-27 DIAGNOSIS — E119 Type 2 diabetes mellitus without complications: Secondary | ICD-10-CM

## 2018-06-27 DIAGNOSIS — I951 Orthostatic hypotension: Secondary | ICD-10-CM | POA: Diagnosis not present

## 2018-06-27 LAB — POCT GLYCOSYLATED HEMOGLOBIN (HGB A1C): Hemoglobin A1C: 5.9 % — AB (ref 4.0–5.6)

## 2018-06-27 LAB — COMPREHENSIVE METABOLIC PANEL
ALT: 98 U/L — ABNORMAL HIGH (ref 0–35)
AST: 122 U/L — ABNORMAL HIGH (ref 0–37)
Albumin: 3.1 g/dL — ABNORMAL LOW (ref 3.5–5.2)
Alkaline Phosphatase: 46 U/L (ref 39–117)
BUN: 23 mg/dL (ref 6–23)
CO2: 29 mEq/L (ref 19–32)
Calcium: 8.3 mg/dL — ABNORMAL LOW (ref 8.4–10.5)
Chloride: 101 mEq/L (ref 96–112)
Creatinine, Ser: 1.43 mg/dL — ABNORMAL HIGH (ref 0.40–1.20)
GFR: 41.87 mL/min — ABNORMAL LOW (ref >60.00)
Glucose, Bld: 155 mg/dL — ABNORMAL HIGH (ref 70–99)
Potassium: 3.9 mEq/L (ref 3.5–5.1)
Sodium: 138 mEq/L (ref 135–145)
Total Bilirubin: 0.7 mg/dL (ref 0.2–1.2)
Total Protein: 5.5 g/dL — ABNORMAL LOW (ref 6.0–8.3)

## 2018-06-27 LAB — T4, FREE: Free T4: 1.6 ng/dL (ref 0.60–1.60)

## 2018-06-27 LAB — TSH: TSH: 0.39 u[IU]/mL (ref 0.35–4.50)

## 2018-06-27 NOTE — Progress Notes (Addendum)
Patient ID: Whitney [name], female   DOB: [birth year], 85 y.o.   MRN: [id number]   Reason for Appointment: Diabetes and other problems  History of Present Illness     Type 2 DIABETES MELITUS, date of diagnosis:  1983     Previous history: She had been taking metformin and Actos for several years.  She usually has had excellent control with upper normal A1c  Recent history:   Oral hypoglycemic drugs: Januvia 100 mg, half tablet daily    She was previously on  Metformin and Actos; because of edema she was told to stop Actos Her metformin was stopped because of renal dysfunction and Amaryl because of hypoglycemia  Her A1c is again excellent at 5.9 compared to her last visit in 12/19  Current blood sugar patterns, management details:  As before she is checking her blood sugars only in the late afternoon between 3-7 PM and mostly after eating  With this her median blood sugar is slightly better than on her last visit  As before she does not check readings fasting or after other meals  She is consistent with taking her Januvia 50 mg daily  Does not always get protein with her meals and in the morning is mostly eating grits without any protein  Her weight is reduced but partly related to change in her edema level        Monitors blood glucose:   less than once a day .    Glucometer: One Touch.           Blood sugars reviewed from monitor download  Blood sugar range 124-190, done mostly between 3-6 PM according to her monitor  MEDIAN blood sugar is 156, previously 167   Physical activity: exercise: Minimal         Wt Readings from Last 3 Encounters:  06/27/18 167 lb (75.8 kg)  05/31/18 174 lb (78.9 kg)  05/27/18 176 lb 3.2 oz (79.9 kg)    LABS:  Lab Results  Component Value Date   HGBA1C 5.9 (A) 06/27/2018   HGBA1C 6.4 (H) 04/18/2018   HGBA1C 5.9 (A) 02/28/2018   Lab Results  Component Value Date   MICROALBUR 6.1 04/18/2018   LDLCALC 59  04/18/2018   CREATININE 1.52 (H) 05/02/2018     OTHER problems  are discussed in review of systems      Allergies as of 06/27/2018      Reactions   Tape Other (See Comments)   Skin is somewhat sensitive      Medication List       Accurate as of June 27, 2018 10:33 AM. Always use your most recent med list.        amiodarone 200 MG tablet Commonly known as:  PACERONE TAKE 1 TABLET BY MOUTH EVERY DAY   atorvastatin 20 MG tablet Commonly known as:  LIPITOR Take 1 tablet (20 mg total) by mouth daily.   diclofenac sodium 1 % Gel Commonly known as:  VOLTAREN APPLY 2 GRAMS TO AFFECTED AREA 4 TIMES A DAY   doxazosin 4 MG tablet Commonly known as:  CARDURA TAKE 1 TABLET BY MOUTH EVERY DAY   Eliquis 5 MG Tabs tablet Generic drug:  apixaban TAKE 1 TABLET TWICE A DAY   Ex-Lax 15 MG Tabs Generic drug:  Sennosides Take 1 tablet by mouth daily as needed (CONSTIPATION).   ferrous sulfate 325 (65 FE) MG tablet Take 325 mg by mouth daily with breakfast.   furosemide 40 MG  tablet Commonly known as:  LASIX Take 1 tablet (40 mg total) by mouth 2 (two) times daily.   lisinopril 20 MG tablet Commonly known as:  PRINIVIL,ZESTRIL TAKE 1 TABLET BY MOUTH EVERY DAY   meclizine 12.5 MG tablet Commonly known as:  ANTIVERT TAKE 1 TABLET BY MOUTH DAILY AS NEEDED FOR DIZZINESS   metoprolol succinate 100 MG 24 hr tablet Commonly known as:  TOPROL-XL TAKE 1 TABLET BY MOUTH TWICE A DAY WITH OR IMMEDIATELY FOLLOWING A MEAL   OneTouch Verio test strip Generic drug:  glucose blood CHECK BLOOD SUGAR EVERY DAY AS DIRECTED   OneTouch Verio w/Device Kit Use to test blood sugars daily.   potassium chloride 10 MEQ tablet Commonly known as:  K-DUR Take 1 tablet (10 mEq total) by mouth daily.   REFRESH OPTIVE ADVANCED OP Place 1 drop into both eyes 3 (three) times daily.   sitaGLIPtin 100 MG tablet Commonly known as:  Januvia Take 1 tablet (100 mg total) by mouth daily.    tamoxifen 20 MG tablet Commonly known as:  NOLVADEX Take 1 tablet (20 mg total) by mouth daily.   traMADol 50 MG tablet Commonly known as:  ULTRAM Take 1 tablet (50 mg total) by mouth every 8 (eight) hours as needed.       Allergies:  Allergies  Allergen Reactions  . Tape Other (See Comments)    Skin is somewhat sensitive    Past Medical History:  Diagnosis Date  . Anemia   . Atrial fibrillation ([place])   . Breast cancer ([facility]) 10/2013   right upper outer  . Cancer ([facility])    right breast  . Complication of anesthesia    slow to wake up  . Diabetes mellitus without complication ([facility])   . Dysrhythmia 10/15   AF  . Former smoker   . Full dentures   . Hyperlipidemia   . Hypertension   . Multinodular goiter   . Radiation    Right Breast  . Thyroid disease    hypothyroidism  . Wears glasses     Past Surgical History:  Procedure Laterality Date  . ABDOMINAL HYSTERECTOMY    . AXILLARY LYMPH NODE DISSECTION Right 01/09/2014   Procedure: RIGHT AXILLARY LYMPH NODE DISECTION;  Surgeon: [name], MD;  Location: [facility];  Service: General;  Laterality: Right;  . BREAST LUMPECTOMY WITH RADIOACTIVE SEED LOCALIZATION Right 01/09/2014   Procedure: RIGHT BREAST SEED LOCALIZED LUMPECTOMY ;  Surgeon: [name], MD;  Location: [facility];  Service: General;  Laterality: Right;  . EYE SURGERY  12/22/2009   cataracts  . INTRAMEDULLARY (IM) NAIL INTERTROCHANTERIC Left 02/24/2014   Procedure: IM ROD LEFT HIP FX;  Surgeon: Alta Corning, MD;  Location: [facility];  Service: Orthopedics;  Laterality: Left;  . PORT-A-CATH REMOVAL Right 01/09/2014   Procedure: REMOVAL PORT-A-CATH;  Surgeon: [name], MD;  Location: [facility];  Service: General;  Laterality: Right;  . PORTACATH PLACEMENT N/A 11/15/2013   Procedure: INSERTION PORT-A-CATH WITH ULTRA SOUND AND FLOROSCOPY;  Surgeon: [name], MD;  Location: [facility];  Service: General;   Laterality: N/A;  . TOTAL KNEE ARTHROPLASTY  2002   left    History reviewed. No pertinent family history.  Social History:  reports that she quit smoking about 59 years ago. She has a 5.00 pack-year smoking history. She has never used smokeless tobacco. She reports that she does not drink alcohol or use drugs.  Review of Systems:  Hypertension:  She is still taking lisinopril 20 mg daily, 4 mg doxazosin, 100 mg of metoprolol  Followed by PCP No lightheadedness although she has had some nonspecific dizziness before   EDEMA this is better recently She is taking Lasix 80 mg daily for edema, prescribed by PCP She is compliant with using her elastic stockings   Renal function: Creatinine has been variable, last reading was lower  Lab Results  Component Value Date   CREATININE 1.52 (H) 05/02/2018   CREATININE 1.95 (H) 04/18/2018   CREATININE 1.72 (H) 02/28/2018     Lipids: Has been on long-term Lipitor with good control     Lab Results  Component Value Date   CHOL 141 04/18/2018   HDL 68 04/18/2018   LDLCALC 59 04/18/2018   TRIG 64 04/18/2018   CHOLHDL 2.1 04/18/2018    She has had vitamin D deficiency, Treated with 2000 units vitamin D 3  GOITER: She has had a multinodular goiter for several years which is relatively small on exam  Levothyroxine supplements were stopped in 5/16 and she has continued to be euthyroid  Her atrial arrhythmias have been treated with 200 mg of amiodarone   Lab Results  Component Value Date   TSH 0.50 04/18/2018   TSH 0.68 10/29/2017   TSH 0.54 06/29/2017   FREET4 1.47 10/29/2017   FREET4 1.17 06/29/2017   FREET4 1.50 10/30/2016    Last foot exam in 8/19   ADRENAL mass: Following is a copy of the previous note  She had a CT scan done previously and was found to have a left adrenal mass  She was evaluated previously by a PET scan after  diagnosis of breast cancer in 2015 She was found to have a 3.7 cm hypermetabolic left  adrenal mass On a previous CT scan in 2011 she also had a 3.8 cm adrenal mass Cortisol level is normal in 2015 and 2016 No history of hypokalemia or severe hypertension/palpitations, headaches, flushing or significant fluctuations in blood pressure Had evaluation with metanephrines and overnight dexamethasone test indicating normal adrenal function   Examination:   BP 126/82 (BP Location: Left Arm, Patient Position: Sitting, Cuff Size: Normal)   Pulse 87   Ht 5' 6.5" (1.689 m)   Wt 167 lb (75.8 kg)   SpO2 98%   BMI 26.55 kg/m   Body mass index is 26.55 kg/m.   Standing blood pressure was 96/68 on the left arm Minimal edema on the right ankle and 1+ on the left  ASSESSMENT/ PLAN:  Diabetes type 2   See history of present illness for  discussion of current diabetes management, blood sugar patterns and problems identified  Her A1c is unchanged at 5.9 compared to her last visit  She is on Januvia 50 mg alone Considering her age and multiple medical problems her control level is adequate and excellent Discussed with patient that she needs to have some protein with breakfast and other meals consistently  Renal dysfunction: This will be checked today    GOITER: Clinically stable and TSH has been normal, will need to continue monitor this since he is on amiodarone  Hypertension: Blood pressure is relatively low standing up We will reduce her Cardura to half tablet and have her follow-up with PCP      Elayne Snare 06/27/2018, 10:33 AM    Note: This office note was prepared with Dragon voice recognition system technology. Any transcriptional errors that result from this process are unintentional.   Liver functions  are high, forwarding to PCP for action  Elayne Snare

## 2018-06-27 NOTE — Progress Notes (Signed)
Please call to let her/granddaughter know kidney test slightly better but liver tests are significantly high, need to discuss with PCP

## 2018-06-27 NOTE — Patient Instructions (Signed)
Reduce Doxazosin to 1/2

## 2018-06-28 ENCOUNTER — Other Ambulatory Visit: Payer: Self-pay | Admitting: Endocrinology

## 2018-06-28 NOTE — Progress Notes (Signed)
Dr. Dwyane Dee, thank you for letting me know.  Sandy, please ask patient to hold any tylenol and lipitor and recheck LFT's Monday and add viral hepatitis panel.  If still elevated, we will get RUQ [place].

## 2018-06-29 ENCOUNTER — Other Ambulatory Visit: Payer: Self-pay | Admitting: *Deleted

## 2018-06-29 MED ORDER — AMIODARONE HCL 200 MG PO TABS: 200.0000 mg | ORAL_TABLET | Freq: Every day | ORAL | 1 refills | Status: DC

## 2018-06-29 NOTE — Progress Notes (Signed)
Patient & pt's granddaughter Whitney [name] aware of providers recommendations and will come in Monday for additional labs.

## 2018-06-30 ENCOUNTER — Other Ambulatory Visit: Payer: Medicare Other

## 2018-06-30 ENCOUNTER — Encounter (INDEPENDENT_AMBULATORY_CARE_PROVIDER_SITE_OTHER): Payer: Self-pay | Admitting: Orthopaedic Surgery

## 2018-06-30 ENCOUNTER — Ambulatory Visit (INDEPENDENT_AMBULATORY_CARE_PROVIDER_SITE_OTHER): Payer: Medicare Other | Admitting: Orthopaedic Surgery

## 2018-06-30 ENCOUNTER — Other Ambulatory Visit: Payer: Self-pay

## 2018-06-30 ENCOUNTER — Other Ambulatory Visit: Payer: Self-pay | Admitting: Family Medicine

## 2018-06-30 DIAGNOSIS — M1711 Unilateral primary osteoarthritis, right knee: Secondary | ICD-10-CM | POA: Diagnosis not present

## 2018-06-30 DIAGNOSIS — R7989 Other specified abnormal findings of blood chemistry: Secondary | ICD-10-CM

## 2018-06-30 DIAGNOSIS — R945 Abnormal results of liver function studies: Principal | ICD-10-CM

## 2018-06-30 MED ORDER — BUPIVACAINE HCL 0.25 % IJ SOLN
2.0000 mL | INTRAMUSCULAR | Status: AC | PRN
  Administered 2018-06-30: 2 mL via INTRA_ARTICULAR

## 2018-06-30 MED ORDER — LIDOCAINE HCL 1 % IJ SOLN
2.0000 mL | INTRAMUSCULAR | Status: AC | PRN
  Administered 2018-06-30: 10:00:00 2 mL

## 2018-06-30 MED ORDER — METHYLPREDNISOLONE ACETATE 40 MG/ML IJ SUSP
40.0000 mg | INTRAMUSCULAR | Status: AC | PRN
  Administered 2018-06-30: 10:00:00 40 mg via INTRA_ARTICULAR

## 2018-06-30 NOTE — Progress Notes (Signed)
Office Visit Note   Patient: Whitney [name]           Date of Birth: [birth year]           MRN: [id number] Visit Date: 06/30/2018              Requested by: [name], MD [address] [place], [place] [address] PCP: [name], MD   Assessment & Plan: Visit Diagnoses:  1. Unilateral primary osteoarthritis, right knee     Plan: Impression is end-stage degenerative joint disease right knee.  We repeated the cortisone injection today.  I did discuss with her that this would likely not give her substantial relief and that definitive treatment is a total knee replacement.  She does understand.  She is not ready to proceed with this right now.  She does have significant underlying health comorbidities to include diabetes, atrial fibrillation on Eliquis and congestive heart failure.  She will follow-up with [place] as needed.  Follow-Up Instructions: Return if symptoms worsen or fail to improve.   Orders:  Orders Placed This Encounter  Procedures  . Large Joint Inj: R knee   No orders of the defined types were placed in this encounter.     Procedures: Large Joint Inj: R knee on 06/30/2018 9:37 AM Indications: pain Details: 22 G needle, anterolateral approach Medications: 2 mL bupivacaine 0.25 %; 2 mL lidocaine 1 %; 40 mg methylPREDNISolone acetate 40 MG/ML      Clinical Data: No additional findings.   Subjective: Chief Complaint  Patient presents with  . Right Knee - Follow-up, Pain, Injections    HPI patient is a pleasant 85 year old female presents our clinic today with recurrent right knee pain.  History of advanced arthritis.  She was seen in our office this past January where she was injected with cortisone and then soon after with hyaluronic acid.  She has had minimal relief of symptoms following both injections.  She has continued pain worse with ambulation.  She takes Tylenol and uses ice as needed.  She comes in today requesting a another  cortisone injection.  Review of Systems as detailed in HPI.  All others reviewed and are negative.   Objective: Vital Signs: There were no vitals taken for this visit.  Physical Exam well-developed well-nourished female no acute distress.  Alert and oriented x3.  Ortho Exam examination of the right knee shows a trace effusion.  Range of motion 0 to 95 degrees.  Lateral joint line tenderness.  Marked full femoral crepitus.  She is neurovascular intact distally.  Specialty Comments:  No specialty comments available.  Imaging: No new imaging   PMFS History: Patient Active Problem List   Diagnosis Date Noted  . Unilateral primary osteoarthritis, right knee 03/25/2018  . Chronic pain of right knee 05/25/2017  . Bacteremia 10/25/2016  . Transaminitis 10/23/2016  . Sinus bradycardia 10/23/2016  . Controlled diabetes mellitus type 2 with complications ([facility])   . Paroxysmal atrial fibrillation ([facility])   . Chronic diastolic CHF (congestive heart failure) ([facility])   . Pulmonary hypertension ([facility])   . Acute renal failure superimposed on stage 2 chronic kidney disease ([facility])   . Elevated liver enzymes   . Bacteremia due to Escherichia coli 12/30/2015  . UTI (urinary tract infection) 12/30/2015  . Acute blood loss anemia 02/25/2014  . Closed left hip fracture ([facility]) 02/24/2014  . PAF (paroxysmal atrial fibrillation) ([facility]) 01/30/2014  . Anticoagulated 01/30/2014  . Chronic diastolic CHF (congestive  heart failure), NYHA class 2 ([facility]) 12/10/2013  . Preoperative cardiovascular examination 12/10/2013  . Neutropenic fever ([facility]) 12/08/2013  . Anemia of chronic disease 12/08/2013  . Thrombocytopenia ([facility]) 12/08/2013  . Hypokalemia 12/08/2013  . Pancytopenia due to antineoplastic chemotherapy ([facility]) 12/08/2013  . Hypothyroidism 12/08/2013  . Breast cancer of upper-outer quadrant of right female breast ([facility]) 11/01/2013  . Essential hypertension, benign 12/01/2012  . Pure hypercholesterolemia  12/01/2012  . DM2 (diabetes mellitus, type 2) ([facility]) 11/21/2012   Past Medical History:  Diagnosis Date  . Anemia   . Atrial fibrillation ([facility])   . Breast cancer ([facility]) 10/2013   right upper outer  . Cancer ([facility])    right breast  . Complication of anesthesia    slow to wake up  . Diabetes mellitus without complication ([facility])   . Dysrhythmia 10/15   AF  . Former smoker   . Full dentures   . Hyperlipidemia   . Hypertension   . Multinodular goiter   . Radiation    Right Breast  . Thyroid disease    hypothyroidism  . Wears glasses     History reviewed. No pertinent family history.  Past Surgical History:  Procedure Laterality Date  . ABDOMINAL HYSTERECTOMY    . AXILLARY LYMPH NODE DISSECTION Right 01/09/2014   Procedure: RIGHT AXILLARY LYMPH NODE DISECTION;  Surgeon: [name], MD;  Location: [facility];  Service: General;  Laterality: Right;  . BREAST LUMPECTOMY WITH RADIOACTIVE SEED LOCALIZATION Right 01/09/2014   Procedure: RIGHT BREAST SEED LOCALIZED LUMPECTOMY ;  Surgeon: [name], MD;  Location: [facility];  Service: General;  Laterality: Right;  . EYE SURGERY  12/22/2009   cataracts  . INTRAMEDULLARY (IM) NAIL INTERTROCHANTERIC Left 02/24/2014   Procedure: IM ROD LEFT HIP FX;  Surgeon: Alta Corning, MD;  Location: [facility];  Service: Orthopedics;  Laterality: Left;  . PORT-A-CATH REMOVAL Right 01/09/2014   Procedure: REMOVAL PORT-A-CATH;  Surgeon: [name], MD;  Location: [facility];  Service: General;  Laterality: Right;  . PORTACATH PLACEMENT N/A 11/15/2013   Procedure: INSERTION PORT-A-CATH WITH ULTRA SOUND AND FLOROSCOPY;  Surgeon: [name], MD;  Location: [facility];  Service: General;  Laterality: N/A;  . TOTAL KNEE ARTHROPLASTY  2002   left   Social History   Occupational History  . Not on file  Tobacco Use  . Smoking status: Former Smoker    Packs/day: 1.00    Years: 5.00    Pack years: 5.00     Last attempt to quit: 11/14/1958    Years since quitting: 59.6  . Smokeless tobacco: Never Used  Substance and Sexual Activity  . Alcohol use: No    Alcohol/week: 0.0 standard drinks  . Drug use: No  . Sexual activity: Never    Comment: menarche age 65, P24, first birth age 27, no HRT, menopause age 78

## 2018-07-04 ENCOUNTER — Other Ambulatory Visit: Payer: Self-pay

## 2018-07-04 ENCOUNTER — Other Ambulatory Visit: Payer: Medicare Other

## 2018-07-04 DIAGNOSIS — R945 Abnormal results of liver function studies: Principal | ICD-10-CM

## 2018-07-04 DIAGNOSIS — R7989 Other specified abnormal findings of blood chemistry: Secondary | ICD-10-CM

## 2018-07-05 ENCOUNTER — Encounter: Payer: Self-pay | Admitting: Family Medicine

## 2018-07-05 ENCOUNTER — Emergency Department (HOSPITAL_COMMUNITY): Payer: Medicare Other

## 2018-07-05 ENCOUNTER — Other Ambulatory Visit: Payer: Self-pay

## 2018-07-05 ENCOUNTER — Ambulatory Visit (INDEPENDENT_AMBULATORY_CARE_PROVIDER_SITE_OTHER): Payer: Medicare Other | Admitting: Family Medicine

## 2018-07-05 ENCOUNTER — Other Ambulatory Visit: Payer: Self-pay | Admitting: Endocrinology

## 2018-07-05 ENCOUNTER — Emergency Department (HOSPITAL_COMMUNITY)
Admission: EM | Admit: 2018-07-05 | Discharge: 2018-07-05 | Disposition: A | Discharge status: Home / Self Care | Payer: Medicare Other | Attending: Emergency Medicine | Admitting: Emergency Medicine

## 2018-07-05 ENCOUNTER — Encounter (HOSPITAL_COMMUNITY): Payer: Self-pay | Admitting: Emergency Medicine

## 2018-07-05 VITALS — BP 146/90 | HR 86 | Temp 98.2°F | Resp 16 | Ht 66.5 in | Wt 170.0 lb

## 2018-07-05 DIAGNOSIS — M545 Low back pain, unspecified: Secondary | ICD-10-CM

## 2018-07-05 DIAGNOSIS — R9431 Abnormal electrocardiogram [ECG] [EKG]: Secondary | ICD-10-CM | POA: Diagnosis not present

## 2018-07-05 DIAGNOSIS — S6992XA Unspecified injury of left wrist, hand and finger(s), initial encounter: Secondary | ICD-10-CM | POA: Diagnosis not present

## 2018-07-05 DIAGNOSIS — Z87891 Personal history of nicotine dependence: Secondary | ICD-10-CM | POA: Insufficient documentation

## 2018-07-05 DIAGNOSIS — S52502A Unspecified fracture of the lower end of left radius, initial encounter for closed fracture: Secondary | ICD-10-CM | POA: Diagnosis not present

## 2018-07-05 DIAGNOSIS — E119 Type 2 diabetes mellitus without complications: Secondary | ICD-10-CM | POA: Diagnosis not present

## 2018-07-05 DIAGNOSIS — W1839XA Other fall on same level, initial encounter: Secondary | ICD-10-CM | POA: Diagnosis not present

## 2018-07-05 DIAGNOSIS — S52572A Other intraarticular fracture of lower end of left radius, initial encounter for closed fracture: Secondary | ICD-10-CM | POA: Diagnosis not present

## 2018-07-05 DIAGNOSIS — Z853 Personal history of malignant neoplasm of breast: Secondary | ICD-10-CM | POA: Diagnosis not present

## 2018-07-05 DIAGNOSIS — R945 Abnormal results of liver function studies: Secondary | ICD-10-CM | POA: Diagnosis not present

## 2018-07-05 DIAGNOSIS — S52592A Other fractures of lower end of left radius, initial encounter for closed fracture: Secondary | ICD-10-CM | POA: Diagnosis not present

## 2018-07-05 DIAGNOSIS — Y9389 Activity, other specified: Secondary | ICD-10-CM | POA: Diagnosis not present

## 2018-07-05 DIAGNOSIS — I10 Essential (primary) hypertension: Secondary | ICD-10-CM | POA: Insufficient documentation

## 2018-07-05 DIAGNOSIS — Y92009 Unspecified place in unspecified non-institutional (private) residence as the place of occurrence of the external cause: Secondary | ICD-10-CM | POA: Insufficient documentation

## 2018-07-05 DIAGNOSIS — Z96652 Presence of left artificial knee joint: Secondary | ICD-10-CM | POA: Insufficient documentation

## 2018-07-05 DIAGNOSIS — Y999 Unspecified external cause status: Secondary | ICD-10-CM | POA: Insufficient documentation

## 2018-07-05 DIAGNOSIS — E039 Hypothyroidism, unspecified: Secondary | ICD-10-CM | POA: Insufficient documentation

## 2018-07-05 DIAGNOSIS — R7989 Other specified abnormal findings of blood chemistry: Secondary | ICD-10-CM

## 2018-07-05 DIAGNOSIS — S52509A Unspecified fracture of the lower end of unspecified radius, initial encounter for closed fracture: Secondary | ICD-10-CM

## 2018-07-05 DIAGNOSIS — S52615A Nondisplaced fracture of left ulna styloid process, initial encounter for closed fracture: Secondary | ICD-10-CM | POA: Diagnosis not present

## 2018-07-05 LAB — URINALYSIS, ROUTINE W REFLEX MICROSCOPIC
Bilirubin Urine: NEGATIVE
Glucose, UA: NEGATIVE mg/dL
Hgb urine dipstick: NEGATIVE
Ketones, ur: NEGATIVE mg/dL
Leukocytes,Ua: NEGATIVE
Nitrite: NEGATIVE
Protein, ur: NEGATIVE mg/dL
Specific Gravity, Urine: 1.006 (ref 1.005–1.030)
pH: 7 (ref 5.0–8.0)

## 2018-07-05 LAB — CBC
HCT: 30.8 % — ABNORMAL LOW (ref 36.0–46.0)
Hemoglobin: 9.8 g/dL — ABNORMAL LOW (ref 12.0–15.0)
MCH: 30.3 pg (ref 26.0–34.0)
MCHC: 31.8 g/dL (ref 30.0–36.0)
MCV: 95.4 fL (ref 80.0–100.0)
Platelets: 177 10*3/uL (ref 150–400)
RBC: 3.23 MIL/uL — ABNORMAL LOW (ref 3.87–5.11)
RDW: 15.2 % (ref 11.5–15.5)
WBC: 5 10*3/uL (ref 4.0–10.5)
nRBC: 0 % (ref 0.0–0.2)

## 2018-07-05 LAB — BASIC METABOLIC PANEL
Anion gap: 7 (ref 5–15)
BUN: 23 mg/dL (ref 8–23)
CO2: 25 mmol/L (ref 22–32)
Calcium: 8.5 mg/dL — ABNORMAL LOW (ref 8.9–10.3)
Chloride: 103 mmol/L (ref 98–111)
Creatinine, Ser: 1.49 mg/dL — ABNORMAL HIGH (ref 0.44–1.00)
GFR calc Af Amer: 36 mL/min — ABNORMAL LOW (ref >60)
GFR calc non Af Amer: 31 mL/min — ABNORMAL LOW (ref >60)
Glucose, Bld: 152 mg/dL — ABNORMAL HIGH (ref 70–99)
Potassium: 4.2 mmol/L (ref 3.5–5.1)
Sodium: 135 mmol/L (ref 135–145)

## 2018-07-05 LAB — COMPREHENSIVE METABOLIC PANEL
AG Ratio: 1.1 (calc) (ref 1.0–2.5)
ALT: 36 U/L — ABNORMAL HIGH (ref 6–29)
AST: 27 U/L (ref 10–35)
Albumin: 3.1 g/dL — ABNORMAL LOW (ref 3.6–5.1)
Alkaline phosphatase (APISO): 46 U/L (ref 37–153)
BUN/Creatinine Ratio: 20 (calc) (ref 6–22)
BUN: 29 mg/dL — ABNORMAL HIGH (ref 7–25)
CO2: 27 mmol/L (ref 20–32)
Calcium: 8.6 mg/dL (ref 8.6–10.4)
Chloride: 104 mmol/L (ref 98–110)
Creat: 1.45 mg/dL — ABNORMAL HIGH (ref 0.60–0.88)
Globulin: 2.7 g/dL (calc) (ref 1.9–3.7)
Glucose, Bld: 149 mg/dL — ABNORMAL HIGH (ref 65–99)
Potassium: 4.6 mmol/L (ref 3.5–5.3)
Sodium: 139 mmol/L (ref 135–146)
Total Bilirubin: 0.5 mg/dL (ref 0.2–1.2)
Total Protein: 5.8 g/dL — ABNORMAL LOW (ref 6.1–8.1)

## 2018-07-05 LAB — HEPATITIS PANEL, ACUTE
Hep A IgM: NONREACTIVE
Hep B C IgM: NONREACTIVE
Hepatitis B Surface Ag: NONREACTIVE
Hepatitis C Ab: NONREACTIVE
SIGNAL TO CUT-OFF: 0.01 (ref <1.00)

## 2018-07-05 LAB — TROPONIN I: Troponin I: <0.03 ng/mL (ref <0.03)

## 2018-07-05 MED ORDER — SODIUM CHLORIDE 0.9% FLUSH
3.0000 mL | Freq: Once | INTRAVENOUS | Status: AC
  Administered 2018-07-05: 17:00:00 3 mL via INTRAVENOUS

## 2018-07-05 MED ORDER — LIDOCAINE HCL (PF) 1 % IJ SOLN
20.0000 mL | Freq: Once | INTRAMUSCULAR | Status: AC
  Administered 2018-07-05: 20 mL via INTRADERMAL
  Filled 2018-07-05: qty 20

## 2018-07-05 MED ORDER — LIDOCAINE HCL 1 % IJ SOLN
20.0000 mL | Freq: Once | INTRAMUSCULAR | Status: DC
  Filled 2018-07-05: qty 20

## 2018-07-05 MED ORDER — ACETAMINOPHEN 500 MG PO TABS
1000.0000 mg | ORAL_TABLET | Freq: Once | ORAL | Status: DC
  Filled 2018-07-05: qty 2

## 2018-07-05 MED ORDER — OXYCODONE HCL 5 MG PO TABS: 5.0000 mg | ORAL_TABLET | Freq: Four times a day (QID) | ORAL | 0 refills | Status: DC | PRN

## 2018-07-05 MED ORDER — HYDROCODONE-ACETAMINOPHEN 5-325 MG PO TABS: 1.0000 | ORAL_TABLET | ORAL | 0 refills | Status: DC | PRN

## 2018-07-05 NOTE — Consult Note (Signed)
Reason for Consult:Left wrist fx Referring Physician: Ranesha [name] is an 85 y.o. female.  HPI: Whitney [name] was walking in her house when she got dizzy and fell. She is unsure how she fell but she had immediate left wrist pain and swelling. She came to the ED for evaluation and x-rays showed a wrist fx and hand surgery was consulted. She is RHD and lives with her granddaughter.  Past Medical History:  Diagnosis Date  . Anemia   . Atrial fibrillation (Whitney [name])   . Breast cancer (Whitney [name]) 10/2013   right upper outer  . Cancer (Whitney [name])    right breast  . Complication of anesthesia    slow to wake up  . Diabetes mellitus without complication (Whitney [name])   . Dysrhythmia 10/15   AF  . Former smoker   . Full dentures   . Hyperlipidemia   . Hypertension   . Multinodular goiter   . Radiation    Right Breast  . Thyroid disease    hypothyroidism  . Wears glasses     Past Surgical History:  Procedure Laterality Date  . ABDOMINAL HYSTERECTOMY    . AXILLARY LYMPH NODE DISSECTION Right 01/09/2014   Procedure: RIGHT AXILLARY LYMPH NODE DISECTION;  Surgeon: [name], MD;  Location: [facility];  Service: General;  Laterality: Right;  . BREAST LUMPECTOMY WITH RADIOACTIVE SEED LOCALIZATION Right 01/09/2014   Procedure: RIGHT BREAST SEED LOCALIZED LUMPECTOMY ;  Surgeon: [name], MD;  Location: [facility];  Service: General;  Laterality: Right;  . EYE SURGERY  12/22/2009   cataracts  . INTRAMEDULLARY (IM) NAIL INTERTROCHANTERIC Left 02/24/2014   Procedure: IM ROD LEFT HIP FX;  Surgeon: Alta Corning, MD;  Location: [facility];  Service: Orthopedics;  Laterality: Left;  . PORT-A-CATH REMOVAL Right 01/09/2014   Procedure: REMOVAL PORT-A-CATH;  Surgeon: [name], MD;  Location: [facility];  Service: General;  Laterality: Right;  . PORTACATH PLACEMENT N/A 11/15/2013   Procedure: INSERTION PORT-A-CATH WITH ULTRA SOUND AND FLOROSCOPY;  Surgeon: [name], MD;  Location: [facility];  Service: General;  Laterality: N/A;  . TOTAL KNEE ARTHROPLASTY  2002   left    History reviewed. No pertinent family history.  Social History:  reports that she quit smoking about 59 years ago. She has a 5.00 pack-year smoking history. She has never used smokeless tobacco. She reports that she does not drink alcohol or use drugs.  Allergies:  Allergies  Allergen Reactions  . Tape Other (See Comments)    Skin is somewhat sensitive    Medications: I have reviewed the patient's current medications.  Results for orders placed or performed during the hospital encounter of 07/05/18 (from the past 48 hour(s))  Basic metabolic panel     Status: Abnormal   Collection Time: 07/05/18  2:21 PM  Result Value Ref Range   Sodium 135 135 - 145 mmol/L   Potassium 4.2 3.5 - 5.1 mmol/L   Chloride 103 98 - 111 mmol/L   CO2 25 22 - 32 mmol/L   Glucose, Bld 152 (H) 70 - 99 mg/dL   BUN 23 8 - 23 mg/dL   Creatinine, Ser 1.49 (H) 0.44 - 1.00 mg/dL   Calcium 8.5 (L) 8.9 - 10.3 mg/dL   GFR calc non Af Amer 31 (L) >60 mL/min   GFR calc Af Amer 36 (L) >60 mL/min   Anion gap 7 5 - 15    Comment: Performed at [facility], [place] [address], [place], [place] [address]  CBC     Status: Abnormal   Collection Time: 07/05/18  2:21 PM  Result Value Ref Range   WBC 5.0 4.0 - 10.5 K/uL   RBC 3.23 (L) 3.87 - 5.11 MIL/uL   Hemoglobin 9.8 (L) 12.0 - 15.0 g/dL   HCT 30.8 (L) 36.0 - 46.0 %   MCV 95.4 80.0 - 100.0 fL   MCH 30.3 26.0 - 34.0 pg   MCHC 31.8 30.0 - 36.0 g/dL   RDW 15.2 11.5 - 15.5 %   Platelets 177 150 - 400 K/uL   nRBC 0.0 0.0 - 0.2 %    Comment: Performed at [facility], [place] [address], [place], [place] [address]    Dg Wrist Complete Left  Result Date: 07/05/2018 CLINICAL DATA:  85 year old female with a fall EXAM: LEFT WRIST - COMPLETE 3+ VIEW COMPARISON:  None. FINDINGS: Osteopenia. Fracture of the radial metadiaphysis with 25 degrees of volar  angulation on the lateral view. Associated fracture of the distal ulna, nondisplaced. Degenerative changes of the carpal bones with chondral calcifications. Questionable cortical irregularity of the pisiform on single view, potentially nondisplaced fracture. Soft tissue swelling. IMPRESSION: Distal left radial fracture with 25 degrees of volar angulation. Associated soft tissue swelling. Nondisplaced ulnar fracture. Questionable nondisplaced fracture of the pisiform, limited by osteopenia. Osteopenia. Degenerative changes of the carpal bones, including chondral calcifications Electronically Signed   By: Corrie Mckusick D.O.   On: 07/05/2018 14:56   Dg Hand Complete Left  Result Date: 07/05/2018 CLINICAL DATA:  Status post fall EXAM: LEFT HAND - COMPLETE 3+ VIEW COMPARISON:  None. FINDINGS: Generalized osteopenia. Impacted and mildly comminuted distal radial metaphysis fracture without definite articular surface involvement. Apex volar angulation and 5 mm of dorsal displacement. Otherwise, no acute fracture or dislocation. Chondrocalcinosis of the TFCC. Moderate osteoarthritis of first CMC joint. Mild osteoarthritis of the PIP joints. Moderate osteoarthritis of the second and fifth DIP joints. Mild osteoarthritis of the third and fourth DIP joints. IMPRESSION: 1. Impacted and mildly comminuted distal radial metaphysis fracture without definite articular surface involvement. Apex volar angulation and 5 mm of dorsal displacement. Electronically Signed   By: Kathreen Devoid   On: 07/05/2018 14:52    Review of Systems  Constitutional: Negative for weight loss.  HENT: Negative for ear discharge, ear pain, hearing loss and tinnitus.   Eyes: Negative for blurred vision, double vision, photophobia and pain.  Respiratory: Negative for cough, sputum production and shortness of breath.   Cardiovascular: Negative for chest pain.  Gastrointestinal: Negative for abdominal pain, nausea and vomiting.  Genitourinary:  Negative for dysuria, flank pain, frequency and urgency.  Musculoskeletal: Positive for joint pain (Left wrist). Negative for back pain, falls, myalgias and neck pain.  Neurological: Negative for dizziness, tingling, sensory change, focal weakness, loss of consciousness and headaches.  Endo/Heme/Allergies: Does not bruise/bleed easily.  Psychiatric/Behavioral: Negative for depression, memory loss and substance abuse. The patient is not nervous/anxious.    Blood pressure (!) 138/91, pulse 64, temperature 97.8 F (36.6 C), temperature source Oral, resp. rate 18, SpO2 95 %. Physical Exam  Constitutional: She appears well-developed and well-nourished. No distress.  HENT:  Head: Normocephalic and atraumatic.  Eyes: Conjunctivae are normal. Right eye exhibits no discharge. Left eye exhibits no discharge. No scleral icterus.  Neck: Normal range of motion.  Cardiovascular: Normal rate and regular rhythm.  Respiratory: Effort normal. No respiratory distress.  Musculoskeletal:  Comments: Left shoulder, elbow, wrist, digits- no skin wounds, mod TTP, mild deformity, ecchymotic  Sens  Ax/R/M/U intact  Mot   Ax/ R/ PIN/ M/ AIN/ U intact  Rad 2+  Neurological: She is alert.  Skin: Skin is warm and dry. She is not diaphoretic.  Psychiatric: She has a normal mood and affect. Her behavior is normal.    Assessment/Plan: Left wrist fx -- Plan on CR, splint, and OP f/u. Will probably need operative fixation. She will see Dr. Amedeo Plenty after discharge. Multiple medical problems including DM, HTN, afib on Eliquis, cancer, CKD, CHF    Lisette Abu, PA-C Orthopedic Surgery [phone] 07/05/2018, 4:26 PM

## 2018-07-05 NOTE — ED Notes (Signed)
[place] (granddaughter) call when pt is discharged

## 2018-07-05 NOTE — ED Notes (Signed)
Granddaughter, Parks Neptune, would like an update as soon as possible at [phone]

## 2018-07-05 NOTE — ED Notes (Signed)
pts granddaughter is leaving, wants someone to call back when she is up for discharge

## 2018-07-05 NOTE — Discharge Instructions (Addendum)
You were evaluated in the Emergency Department and after careful evaluation, we did not find any emergent condition requiring admission or further testing in the hospital.  Your symptoms today seem to be due to a broken wrist.  Please call the orthopedic office number provided to set up an appointment within the next few days.  Use Tylenol during the day for pain.  You can also use the Norco medication provided for pain keeping her from sleeping.  Please return to the Emergency Department if you experience any worsening of your condition.  We encourage you to follow up with a primary care provider.  Thank you for allowing [place] to be a part of your care.

## 2018-07-05 NOTE — ED Triage Notes (Signed)
Pt reports feeling dizzy and falling on her left wrist, swelling noted, denies LOC. A&O x4.

## 2018-07-05 NOTE — ED Notes (Signed)
Pt aware that urine sample is needed, but is unable to provide one at this time 

## 2018-07-05 NOTE — Progress Notes (Signed)
Subjective:    Patient ID: Whitney [name], female    DOB: 1927/08/09, 85 y.o.   MRN: [id number]  HPI  05/31/18 Patient presents with 45-monthhistory of low back pain.  The pain is located primarily on the lower left side roughly at the level of L4-L5 just above the level of the iliac crest.  She states that it feels fine when she is sitting however with standing, the pain gets worse.  It hurts as long she is walking.  She denies any sciatica radiating down her legs.  She originally complained of hip pain however the pain seems to be very high compared to her hip.  She has no pain with passive flexion of the hip.  She has no pain with passive internal or external rotation of the left hip.  She has no tenderness to palpation over the greater trochanter.  She does have some tenderness to palpation in the lumbar paraspinal muscles on the left-hand side.  She denies any falls or injuries.  She is unable to tolerate NSAIDs due to her history of A. fib and chronic kidney disease.  At that time, my plan was:  I suspect that this is a muscle in her lower back that she is injured 2 months ago.  May need to consider physical therapy and a muscle relaxer.  However I would start by obtaining x-rays of the lower back as well as her left hip.  If x-rays are normal or show degenerative disc disease, I would recommend trying a low-dose muscle relaxer and physical therapy to alleviate her pain given the 275-monthuration and symptoms.  Await the results of the x-ray.  She is unable to take NSAIDs due to chronic kidney disease and chronic anticoagulant use  07/05/18 L-spine xray revealed: IMPRESSION: 1. Severe L3 compression fracture, no change from prior CT of 01/01/2016. No acute bony abnormality.  2. Lumbar spine scoliosis and diffuse degenerative change. Degenerative changes both hips. Postsurgical changes left hip.  Xray of the left hip revealed: IMPRESSION: ORIF left hip.  No acute bony abnormality.  I  have copied my recommendations at that time below:  Hip x-ray reveals no cause for pain. Back x-ray shows severe degenerative disc disease as well as an old compression fracture at L3. These are most likely the causes of her pain. Would recommend trying tramadol 50 mg every 8 hours as needed for pain. This is a pain pill so she needs to be careful with it as it can make her groggy or constipated. She can also take Tylenol 1000 mg twice daily which would be safer than the tramadol. These 2 medicines can be combined if needed.  Recently I received communication from her endocrinologist that her liver function tests were elevated.  I recommended that she discontinue Tylenol as well as Lipitor and her liver function test have improved dramatically suggesting that the Tylenol and/or Lipitor was causing liver inflammation.  I suspect the majority the liver inflammation was coming from the Tylenol as she is never had issues with her liver prior to taking the Tylenol.  She has since stopped the Tylenol and her liver tests have improved.  However the Tylenol and the tramadol did not help her pain at all.  The pain is still located in her lower back to the left of her spine.  I reviewed her x-rays with her and her granddaughter today.  She has degenerative disc disease especially at L4-L5 and L5-S1.  The disc brace is  essentially obliterated in that area and she has degenerative bone spurs forming off the vertebrae at each of those levels.  She also has the old compression fracture seen at L3 as well as scoliosis.  I believe that she has compensatory muscle spasms and muscle pain due to that however she is unable to take NSAIDs due to her chronic kidney disease and anticoagulation and the previous medications have been unhelpful.  She is a poor surgical candidate due to her advanced age.     Past Medical History:  Diagnosis Date  . Anemia   . Atrial fibrillation ([place])   . Breast cancer ([place]) 10/2013    right upper outer  . Cancer ([place])    right breast  . Complication of anesthesia    slow to wake up  . Diabetes mellitus without complication ([place])   . Dysrhythmia 10/15   AF  . Former smoker   . Full dentures   . Hyperlipidemia   . Hypertension   . Multinodular goiter   . Radiation    Right Breast  . Thyroid disease    hypothyroidism  . Wears glasses    Past Surgical History:  Procedure Laterality Date  . ABDOMINAL HYSTERECTOMY    . AXILLARY LYMPH NODE DISSECTION Right 01/09/2014   Procedure: RIGHT AXILLARY LYMPH NODE DISECTION;  Surgeon: [name], MD;  Location: [facility];  Service: General;  Laterality: Right;  . BREAST LUMPECTOMY WITH RADIOACTIVE SEED LOCALIZATION Right 01/09/2014   Procedure: RIGHT BREAST SEED LOCALIZED LUMPECTOMY ;  Surgeon: [name], MD;  Location: [facility];  Service: General;  Laterality: Right;  . EYE SURGERY  12/22/2009   cataracts  . INTRAMEDULLARY (IM) NAIL INTERTROCHANTERIC Left 02/24/2014   Procedure: IM ROD LEFT HIP FX;  Surgeon: Alta Corning, MD;  Location: [facility];  Service: Orthopedics;  Laterality: Left;  . PORT-A-CATH REMOVAL Right 01/09/2014   Procedure: REMOVAL PORT-A-CATH;  Surgeon: [name], MD;  Location: [facility];  Service: General;  Laterality: Right;  . PORTACATH PLACEMENT N/A 11/15/2013   Procedure: INSERTION PORT-A-CATH WITH ULTRA SOUND AND FLOROSCOPY;  Surgeon: [name], MD;  Location: [facility];  Service: General;  Laterality: N/A;  . TOTAL KNEE ARTHROPLASTY  2002   left   Current Outpatient Medications on File Prior to Visit  Medication Sig Dispense Refill  . amiodarone (PACERONE) 200 MG tablet Take 1 tablet (200 mg total) by mouth daily. 90 tablet 1  . atorvastatin (LIPITOR) 20 MG tablet Take 1 tablet (20 mg total) by mouth daily. 90 tablet 3  . Blood Glucose Monitoring Suppl (ONETOUCH VERIO) w/Device KIT Use to test blood sugars daily. 1 kit 0  .  Carboxymeth-Glycerin-Polysorb (REFRESH OPTIVE ADVANCED OP) Place 1 drop into both eyes 3 (three) times daily.    . diclofenac sodium (VOLTAREN) 1 % GEL APPLY 2 GRAMS TO AFFECTED AREA 4 TIMES A DAY 100 g 2  . doxazosin (CARDURA) 4 MG tablet TAKE 1 TABLET BY MOUTH EVERY DAY 90 tablet 2  . ELIQUIS 5 MG TABS tablet TAKE 1 TABLET TWICE A DAY 60 tablet 10  . ferrous sulfate 325 (65 FE) MG tablet Take 325 mg by mouth daily with breakfast.    . furosemide (LASIX) 40 MG tablet Take 1 tablet (40 mg total) by mouth 2 (two) times daily. 180 tablet 2  . lisinopril (PRINIVIL,ZESTRIL) 20 MG tablet TAKE 1 TABLET BY MOUTH EVERY DAY 90 tablet 3  . meclizine (ANTIVERT) 12.5 MG  tablet TAKE 1 TABLET BY MOUTH DAILY AS NEEDED FOR DIZZINESS 10 tablet 0  . metoprolol succinate (TOPROL-XL) 100 MG 24 hr tablet TAKE 1 TABLET BY MOUTH TWICE A DAY WITH OR IMMEDIATELY FOLLOWING A MEAL 180 tablet 1  . ONETOUCH VERIO test strip CHECK BLOOD SUGAR EVERY DAY AS DIRECTED 100 each 1  . potassium chloride (K-DUR) 10 MEQ tablet Take 1 tablet (10 mEq total) by mouth daily. 90 tablet 3  . Sennosides (EX-LAX) 15 MG TABS Take 1 tablet by mouth daily as needed (CONSTIPATION).    Marland Kitchen sitaGLIPtin (JANUVIA) 100 MG tablet Take 1 tablet (100 mg total) by mouth daily. 90 tablet 1  . tamoxifen (NOLVADEX) 20 MG tablet Take 1 tablet (20 mg total) by mouth daily. 90 tablet 3  . traMADol (ULTRAM) 50 MG tablet Take 1 tablet (50 mg total) by mouth every 8 (eight) hours as needed. 60 tablet 0   No current facility-administered medications on file prior to visit.    Allergies  Allergen Reactions  . Tape Other (See Comments)    Skin is somewhat sensitive   Social History   Socioeconomic History  . Marital status: Widowed    Spouse name: Not on file  . Number of children: 4  . Years of education: Not on file  . Highest education level: Not on file  Occupational History  . Not on file  Social Needs  . Financial resource strain: Not on file  .  Food insecurity:    Worry: Not on file    Inability: Not on file  . Transportation needs:    Medical: Not on file    Non-medical: Not on file  Tobacco Use  . Smoking status: Former Smoker    Packs/day: 1.00    Years: 5.00    Pack years: 5.00    Last attempt to quit: 11/14/1958    Years since quitting: 59.6  . Smokeless tobacco: Never Used  Substance and Sexual Activity  . Alcohol use: No    Alcohol/week: 0.0 standard drinks  . Drug use: No  . Sexual activity: Never    Comment: menarche age 31, P61, first birth age 40, no HRT, menopause age 67  Lifestyle  . Physical activity:    Days per week: Not on file    Minutes per session: Not on file  . Stress: Not on file  Relationships  . Social connections:    Talks on phone: Not on file    Gets together: Not on file    Attends religious service: Not on file    Active member of club or organization: Not on file    Attends meetings of clubs or organizations: Not on file    Relationship status: Not on file  . Intimate partner violence:    Fear of current or ex partner: Not on file    Emotionally abused: Not on file    Physically abused: Not on file    Forced sexual activity: Not on file  Other Topics Concern  . Not on file  Social History Narrative  . Not on file   No family history on file.   Review of Systems  All other systems reviewed and are negative.      Objective:   Physical Exam  Constitutional: She appears well-developed and well-nourished. No distress.  Cardiovascular: Normal rate, regular rhythm and intact distal pulses. Exam reveals no gallop and no friction rub.  Murmur heard. Pulmonary/Chest: Effort normal and breath sounds normal. No respiratory  distress. She has no wheezes. She has no rales.  Abdominal: Soft. Bowel sounds are normal.  Musculoskeletal:     Left hip: She exhibits normal range of motion, normal strength, no tenderness and no bony tenderness.     Lumbar back: She exhibits decreased range  of motion, tenderness and pain. She exhibits no bony tenderness and no spasm.       Back:  Skin: She is not diaphoretic.  Vitals reviewed.         Assessment & Plan:  Low back pain without sciatica, unspecified back pain laterality, unspecified chronicity  Elevated LFTs  Explained in detail to the patient the etiology of her back pain as well as her elevated liver function test.  The elevated liver function test I am certain were coming from the Tylenol.  I would recommend staying away from Tylenol.  We will recheck her liver function test in 2 weeks and if normal then I will resume her Lipitor as I do not feel that this was causing the problem.  I believe her low back pain is compensatory muscle spasms accompanied with degenerative disc disease at L4-L5 along with a compression fracture at L3 and the scoliosis.  Her options are extremely limited.  She cannot take NSAIDs due to chronic kidney disease and anticoagulation.  Tylenol elevated her liver function testing was unhelpful.  Tramadol provided her no relief.  Therefore I will try oxycodone 5 mg.  I spent a great deal of time with the patient outlining the difficulties of this including constipation, confusion, increased fall risk, disorientation, and drowsiness.  I cautioned her to use the medication sparingly only for severe pain.

## 2018-07-05 NOTE — ED Notes (Signed)
Ice applied to left wrist

## 2018-07-05 NOTE — ED Provider Notes (Signed)
[facility] Emergency Department Provider Note MRN:  [id number]  Arrival date & time: 07/05/18     Chief Complaint   Fall and Wrist Injury   History of Present Illness   Whitney [name] is a 85 y.o. year-old female with a history of atrial fibrillation, hypertension presenting to the ED with chief complaint of fall.  Patient explains that she was walking around in her home when she became mildly lightheaded.  She explains that this happens every few weeks and has been occurring for at least 1 or 2 years.  Her PCP is aware of this issue.  Patient fell forward onto an outstretched left hand.  Patient denies any head trauma or loss of consciousness, no neck pain, no chest pain, no shortness of breath.  No abdominal pain.  No hip or leg pain.  Isolated left wrist pain.  Notable swelling.  Pain is moderate, worse with motion.  Constant pain.  Review of Systems  A complete 10 system review of systems was obtained and all systems are negative except as noted in the HPI and PMH.   Patient's Health History    Past Medical History:  Diagnosis Date  . Anemia   . Atrial fibrillation ([place])   . Breast cancer ([place]) 10/2013   right upper outer  . Cancer ([place])    right breast  . Complication of anesthesia    slow to wake up  . Diabetes mellitus without complication ([place])   . Dysrhythmia 10/15   AF  . Former smoker   . Full dentures   . Hyperlipidemia   . Hypertension   . Multinodular goiter   . Radiation    Right Breast  . Thyroid disease    hypothyroidism  . Wears glasses     Past Surgical History:  Procedure Laterality Date  . ABDOMINAL HYSTERECTOMY    . AXILLARY LYMPH NODE DISSECTION Right 01/09/2014   Procedure: RIGHT AXILLARY LYMPH NODE DISECTION;  Surgeon: [name], MD;  Location: [facility];  Service: General;  Laterality: Right;  . BREAST LUMPECTOMY WITH RADIOACTIVE SEED LOCALIZATION Right 01/09/2014   Procedure: RIGHT BREAST SEED  LOCALIZED LUMPECTOMY ;  Surgeon: [name], MD;  Location: [facility];  Service: General;  Laterality: Right;  . EYE SURGERY  12/22/2009   cataracts  . INTRAMEDULLARY (IM) NAIL INTERTROCHANTERIC Left 02/24/2014   Procedure: IM ROD LEFT HIP FX;  Surgeon: Alta Corning, MD;  Location: [facility];  Service: Orthopedics;  Laterality: Left;  . PORT-A-CATH REMOVAL Right 01/09/2014   Procedure: REMOVAL PORT-A-CATH;  Surgeon: [name], MD;  Location: [facility];  Service: General;  Laterality: Right;  . PORTACATH PLACEMENT N/A 11/15/2013   Procedure: INSERTION PORT-A-CATH WITH ULTRA SOUND AND FLOROSCOPY;  Surgeon: [name], MD;  Location: [facility];  Service: General;  Laterality: N/A;  . TOTAL KNEE ARTHROPLASTY  2002   left    History reviewed. No pertinent family history.  Social History   Socioeconomic History  . Marital status: Widowed    Spouse name: Not on file  . Number of children: 4  . Years of education: Not on file  . Highest education level: Not on file  Occupational History  . Not on file  Social Needs  . Financial resource strain: Not on file  . Food insecurity:    Worry: Not on file    Inability: Not on file  . Transportation needs:    Medical: Not on file  Non-medical: Not on file  Tobacco Use  . Smoking status: Former Smoker    Packs/day: 1.00    Years: 5.00    Pack years: 5.00    Last attempt to quit: 11/14/1958    Years since quitting: 59.6  . Smokeless tobacco: Never Used  Substance and Sexual Activity  . Alcohol use: No    Alcohol/week: 0.0 standard drinks  . Drug use: No  . Sexual activity: Never    Comment: menarche age 8, P31, first birth age 53, no HRT, menopause age 21  Lifestyle  . Physical activity:    Days per week: Not on file    Minutes per session: Not on file  . Stress: Not on file  Relationships  . Social connections:    Talks on phone: Not on file    Gets together: Not on file    Attends religious  service: Not on file    Active member of club or organization: Not on file    Attends meetings of clubs or organizations: Not on file    Relationship status: Not on file  . Intimate partner violence:    Fear of current or ex partner: Not on file    Emotionally abused: Not on file    Physically abused: Not on file    Forced sexual activity: Not on file  Other Topics Concern  . Not on file  Social History Narrative  . Not on file     Physical Exam  Vital Signs and Nursing Notes reviewed Vitals:   07/05/18 2015 07/05/18 2030  BP:    Pulse: 88 82  Resp:    Temp:    SpO2: 100% 100%    CONSTITUTIONAL: Well-appearing, NAD NEURO:  Alert and oriented x 3, no focal deficits EYES:  eyes equal and reactive ENT/NECK:  no LAD, no JVD CARDIO: Regular rate, well-perfused, normal S1 and S2 PULM:  CTAB no wheezing or rhonchi GI/GU:  normal bowel sounds, non-distended, non-tender MSK/SPINE: Swelling to the left wrist with bruising, tenderness to palpation, neurovascularly intact distally SKIN:  no rash, atraumatic PSYCH:  Appropriate speech and behavior  Diagnostic and Interventional Summary    EKG Interpretation  Date/Time:  Tuesday July 05 2018 14:22:09 EDT Ventricular Rate:  83 PR Interval:    QRS Duration: 84 QT Interval:  390 QTC Calculation: 458 R Axis:   30 Text Interpretation:  Undetermined rhythm T wave abnormality, consider anterior ischemia Abnormal ECG Confirmed by Whitney [name] [phone]) on 07/05/2018 4:42:07 PM      Labs Reviewed  BASIC METABOLIC PANEL - Abnormal; Notable for the following components:      Result Value   Glucose, Bld 152 (*)    Creatinine, Ser 1.49 (*)    Calcium 8.5 (*)    GFR calc non Af Amer 31 (*)    GFR calc Af Amer 36 (*)    All other components within normal limits  CBC - Abnormal; Notable for the following components:   RBC 3.23 (*)    Hemoglobin 9.8 (*)    HCT 30.8 (*)    All other components within normal limits  URINALYSIS, ROUTINE  W REFLEX MICROSCOPIC - Abnormal; Notable for the following components:   Color, Urine STRAW (*)    All other components within normal limits  TROPONIN I  CBG MONITORING, ED    DG Hand Complete Left  Final Result    DG Wrist Complete Left  Final Result      Medications  acetaminophen (  TYLENOL) tablet 1,000 mg (1,000 mg Oral Not Given 07/05/18 1707)  sodium chloride flush (NS) 0.9 % injection 3 mL (3 mLs Intravenous Given 07/05/18 1707)  lidocaine (PF) (XYLOCAINE) 1 % injection 20 mL (20 mLs Intradermal Given 07/05/18 1743)     Procedures Critical Care  ED Course and Medical Decision Making  I have reviewed the triage vital signs and the nursing notes.  Pertinent labs & imaging results that were available during my care of the patient were reviewed by me and considered in my medical decision making (see below for details).  Dizziness causing a fall in this 85 year old female.  Described as lightheadedness.  Patient has no nystagmus, no focal neurological deficits.  Dizziness reportedly chronic for at least a year.  EKG does show some nonspecific changes from the prior back in 2018, will follow-up with troponin though patient denies chest pain.  Will consult hand for Colles' fracture noted on x-ray.  Splinted by hand consult.  Medical work-up unrevealing, negative troponin, normal electrolytes, normal urinalysis.  Given the chronic nature of this dizziness, felt best to be followed up as an outpatient.  We will follow-up with orthopedic surgery.  After the discussed management above, the patient was determined to be safe for discharge.  The patient was in agreement with this plan and all questions regarding their care were answered.  ED return precautions were discussed and the patient will return to the ED with any significant worsening of condition.  Barth Kirks. [place]onia Small, [place] [email or web address]  Final Clinical Impressions(s) / ED  Diagnoses     ICD-10-CM   1. Closed fracture of distal end of radius, unspecified fracture morphology, unspecified laterality, initial encounter S52.509A     ED Discharge Orders         Ordered    HYDROcodone-acetaminophen (NORCO/VICODIN) 5-325 MG tablet  Every 4 hours PRN     07/05/18 2035             Maudie Flakes, MD 07/05/18 2049

## 2018-07-05 NOTE — Consult Note (Signed)
Reason for Consult: Left radius fracture Referring Physician: ER staff  Whitney [name] is an 85 y.o. female.  HPI: 85 year old female with history of multiple medical problems and anticoagulation (on Eliquis) who presents with a left displaced distal radius fracture.  She denies other injury.  I have discussed all issues with her family member [place] at [phone].  She denies neck back chest or abdominal pain.  She denies instability type symptoms about her elbow or upper arm left upper extremity.  The right upper extremity is stable.  She denies neck back or abdominal pain.  I reviewed all issues with her at length.  She is alert to the year and her surroundings.  Past Medical History:  Diagnosis Date  . Anemia   . Atrial fibrillation ([place])   . Breast cancer ([place]) 10/2013   right upper outer  . Cancer ([place])    right breast  . Complication of anesthesia    slow to wake up  . Diabetes mellitus without complication ([place])   . Dysrhythmia 10/15   AF  . Former smoker   . Full dentures   . Hyperlipidemia   . Hypertension   . Multinodular goiter   . Radiation    Right Breast  . Thyroid disease    hypothyroidism  . Wears glasses     Past Surgical History:  Procedure Laterality Date  . ABDOMINAL HYSTERECTOMY    . AXILLARY LYMPH NODE DISSECTION Right 01/09/2014   Procedure: RIGHT AXILLARY LYMPH NODE DISECTION;  Surgeon: [name], MD;  Location: [facility];  Service: General;  Laterality: Right;  . BREAST LUMPECTOMY WITH RADIOACTIVE SEED LOCALIZATION Right 01/09/2014   Procedure: RIGHT BREAST SEED LOCALIZED LUMPECTOMY ;  Surgeon: [name], MD;  Location: [facility];  Service: General;  Laterality: Right;  . EYE SURGERY  12/22/2009   cataracts  . INTRAMEDULLARY (IM) NAIL INTERTROCHANTERIC Left 02/24/2014   Procedure: IM ROD LEFT HIP FX;  Surgeon: Alta Corning, MD;  Location: [facility];  Service: Orthopedics;  Laterality: Left;  .  PORT-A-CATH REMOVAL Right 01/09/2014   Procedure: REMOVAL PORT-A-CATH;  Surgeon: [name], MD;  Location: [facility];  Service: General;  Laterality: Right;  . PORTACATH PLACEMENT N/A 11/15/2013   Procedure: INSERTION PORT-A-CATH WITH ULTRA SOUND AND FLOROSCOPY;  Surgeon: [name], MD;  Location: [facility];  Service: General;  Laterality: N/A;  . TOTAL KNEE ARTHROPLASTY  2002   left    History reviewed. No pertinent family history.  Social History:  reports that she quit smoking about 59 years ago. She has a 5.00 pack-year smoking history. She has never used smokeless tobacco. She reports that she does not drink alcohol or use drugs.  Allergies:  Allergies  Allergen Reactions  . Tape Other (See Comments)    Skin is somewhat sensitive    Medications: I have reviewed the patient's current medications.  Results for orders placed or performed during the hospital encounter of 07/05/18 (from the past 48 hour(s))  Basic metabolic panel     Status: Abnormal   Collection Time: 07/05/18  2:21 PM  Result Value Ref Range   Sodium 135 135 - 145 mmol/L   Potassium 4.2 3.5 - 5.1 mmol/L   Chloride 103 98 - 111 mmol/L   CO2 25 22 - 32 mmol/L   Glucose, Bld 152 (H) 70 - 99 mg/dL   BUN 23 8 - 23 mg/dL   Creatinine, Ser 1.49 (H) 0.44 - 1.00 mg/dL  Calcium 8.5 (L) 8.9 - 10.3 mg/dL   GFR calc non Af Amer 31 (L) >60 mL/min   GFR calc Af Amer 36 (L) >60 mL/min   Anion gap 7 5 - 15    Comment: Performed at [facility] [address], [place], [place] [address]  CBC     Status: Abnormal   Collection Time: 07/05/18  2:21 PM  Result Value Ref Range   WBC 5.0 4.0 - 10.5 K/uL   RBC 3.23 (L) 3.87 - 5.11 MIL/uL   Hemoglobin 9.8 (L) 12.0 - 15.0 g/dL   HCT 30.8 (L) 36.0 - 46.0 %   MCV 95.4 80.0 - 100.0 fL   MCH 30.3 26.0 - 34.0 pg   MCHC 31.8 30.0 - 36.0 g/dL   RDW 15.2 11.5 - 15.5 %   Platelets 177 150 - 400 K/uL   nRBC 0.0 0.0 - 0.2 %    Comment: Performed at [facility], [place] [address], [place], [place] [address]    Dg Wrist Complete Left  Result Date: 07/05/2018 CLINICAL DATA:  86 year old female with a fall EXAM: LEFT WRIST - COMPLETE 3+ VIEW COMPARISON:  None. FINDINGS: Osteopenia. Fracture of the radial metadiaphysis with 25 degrees of volar angulation on the lateral view. Associated fracture of the distal ulna, nondisplaced. Degenerative changes of the carpal bones with chondral calcifications. Questionable cortical irregularity of the pisiform on single view, potentially nondisplaced fracture. Soft tissue swelling. IMPRESSION: Distal left radial fracture with 25 degrees of volar angulation. Associated soft tissue swelling. Nondisplaced ulnar fracture. Questionable nondisplaced fracture of the pisiform, limited by osteopenia. Osteopenia. Degenerative changes of the carpal bones, including chondral calcifications Electronically Signed   By: Corrie Mckusick D.O.   On: 07/05/2018 14:56   Dg Hand Complete Left  Result Date: 07/05/2018 CLINICAL DATA:  Status post fall EXAM: LEFT HAND - COMPLETE 3+ VIEW COMPARISON:  None. FINDINGS: Generalized osteopenia. Impacted and mildly comminuted distal radial metaphysis fracture without definite articular surface involvement. Apex volar angulation and 5 mm of dorsal displacement. Otherwise, no acute fracture or dislocation. Chondrocalcinosis of the TFCC. Moderate osteoarthritis of first CMC joint. Mild osteoarthritis of the PIP joints. Moderate osteoarthritis of the second and fifth DIP joints. Mild osteoarthritis of the third and fourth DIP joints. IMPRESSION: 1. Impacted and mildly comminuted distal radial metaphysis fracture without definite articular surface involvement. Apex volar angulation and 5 mm of dorsal displacement. Electronically Signed   By: Kathreen Devoid   On: 07/05/2018 14:52    Review of Systems  Respiratory: Negative.   Cardiovascular: Negative.   Gastrointestinal: Negative.   Genitourinary:  Negative.    Blood pressure (!) 148/87, pulse 87, temperature 97.8 F (36.6 C), temperature source Oral, resp. rate 19, SpO2 98 %. Physical Exam Displaced left distal radius fracture comminuted complex grade 3 part.  She is intact to sensation and has refill.  Elbow is stable no evidence of instability or infection in the upper arm.  Right upper extremity is stable.  Lower extremity examination is stable she can walk with a nonantalgic gait.  Abdomen is soft nontender nondistended.  Chest is clear.  HEENT is within normal limits.  X-rays do reveal a comminuted complex distal radius fracture displaced in nature.  We will plan for close reduction. Assessment/Plan: Displaced left distal radius fracture in an 85 year old female on anticoagulants.  Procedure patient was consented verbally and underwent closure reduction.  07/05/2018  6:43 PM  PATIENT:  Whitney [name]  PRE-OPERATIVE DIAGNOSIS: Left radius fracture displaced  POST-OPERATIVE DIAGNOSIS:  Same  PROCEDURE: Closed reduction left distal radius fracture with hematoma block  SURGEON:  Willa Frater III, MD  PHYSICIAN ASSISTANT:   ANESTHESIA:   Hematoma block  PREOPERATIVE INDICATIONS:  Whitney [name] is a  85 y.o. female with a diagnosis of   The risks benefits and alternatives were discussed with the patient preoperatively including but not limited to the risks of infection, bleeding, nerve injury, cardiopulmonary complications, the need for revision surgery, among others, and the patient was willing to proceed.   OPERATIVE PROCEDURE:  The risks benefits and alternatives were discussed with the patient preoperatively including but not limited to the risks of infection, bleeding, nerve injury, cardiopulmonary complications, the need for revision surgery, among others, and the patient was willing to proceed.   OPERATIVE PROCEDURE: The patient was seen and counseled in regard to the upper extremity predicament.  Following this the extremity underwent a hematoma block with lidocaine without epinephrine. I sterilely prepped and draped the skin prior to doing so with alcohol and betadiene. Once this was accomplished the patient was placed in fingertrap traction and underwent a reduction of the distal radius. The fracture was reduced and following this a sugar tong splint/cast was applied without difficulty. The neurovascular status showed no evidence of compartment syndrome, dystrophy or infection. the patient had pink fingertips, excellent refill and no complications.  We have discussed with patient elevation,  range of motion to the fingers, massage and other measures to prevent neurovascular problems.  Once again we plan to proceed with ice elevation move and massage fingers. Postreduction x-ray showed improved position of the fracture. Will plan for serial radiographs and fixation based on the amount of comminution and progressive angulatory collapse as well as wrist Apgar scores. Patient understands these don'ts etc.   She will be discharged home.  I will see her back in a week.  I would expect some degree of progressive angulatory displacement given her age.  Given all of her medical issues we will try and afford her a conservative algorithm of care however if she does not do well the next step would be consideration for a surgical event.  I discussed all issues with her family including precautions of elevation range of motion massage to the fingers and our standard protocol.  Whitney [name] 07/05/2018, 6:41 PM

## 2018-07-05 NOTE — ED Notes (Signed)
Pts daughter requests to be contacted at [phone] for updates and treatment plan information, the pts daughter took her cane back home with her

## 2018-07-08 ENCOUNTER — Other Ambulatory Visit: Payer: Medicare Other

## 2018-07-13 DIAGNOSIS — Z5189 Encounter for other specified aftercare: Secondary | ICD-10-CM | POA: Diagnosis not present

## 2018-07-13 DIAGNOSIS — M25532 Pain in left wrist: Secondary | ICD-10-CM | POA: Diagnosis not present

## 2018-07-13 DIAGNOSIS — S52502A Unspecified fracture of the lower end of left radius, initial encounter for closed fracture: Secondary | ICD-10-CM | POA: Diagnosis not present

## 2018-07-14 ENCOUNTER — Other Ambulatory Visit: Payer: Self-pay | Admitting: Family Medicine

## 2018-07-14 MED ORDER — ATORVASTATIN CALCIUM 20 MG PO TABS: 20.0000 mg | ORAL_TABLET | Freq: Every day | ORAL | 3 refills | Status: DC

## 2018-07-23 ENCOUNTER — Other Ambulatory Visit: Payer: Self-pay | Admitting: Family Medicine

## 2018-07-25 DIAGNOSIS — Z5189 Encounter for other specified aftercare: Secondary | ICD-10-CM | POA: Diagnosis not present

## 2018-07-25 DIAGNOSIS — S52502D Unspecified fracture of the lower end of left radius, subsequent encounter for closed fracture with routine healing: Secondary | ICD-10-CM | POA: Diagnosis not present

## 2018-07-25 DIAGNOSIS — M25532 Pain in left wrist: Secondary | ICD-10-CM | POA: Diagnosis not present

## 2018-08-04 ENCOUNTER — Telehealth: Payer: Self-pay | Admitting: Family Medicine

## 2018-08-04 MED ORDER — MECLIZINE HCL 12.5 MG PO TABS: ORAL_TABLET | ORAL | 2 refills | Status: DC

## 2018-08-04 NOTE — Telephone Encounter (Signed)
Medication called/sent to requested pharmacy  

## 2018-08-04 NOTE — Telephone Encounter (Signed)
Pt needs refill on meclizine [facility].

## 2018-08-05 ENCOUNTER — Telehealth: Payer: Self-pay | Admitting: Family Medicine

## 2018-08-09 ENCOUNTER — Other Ambulatory Visit: Payer: Self-pay

## 2018-08-09 ENCOUNTER — Encounter: Payer: Self-pay | Admitting: Family Medicine

## 2018-08-09 ENCOUNTER — Ambulatory Visit (INDEPENDENT_AMBULATORY_CARE_PROVIDER_SITE_OTHER): Payer: Medicare Other | Admitting: Family Medicine

## 2018-08-09 VITALS — BP 130/72 | HR 78 | Temp 98.2°F | Resp 14 | Ht 66.5 in | Wt 175.0 lb

## 2018-08-09 DIAGNOSIS — M7989 Other specified soft tissue disorders: Secondary | ICD-10-CM

## 2018-08-09 DIAGNOSIS — I5032 Chronic diastolic (congestive) heart failure: Secondary | ICD-10-CM

## 2018-08-09 MED ORDER — METOLAZONE 5 MG PO TABS: 5.0000 mg | ORAL_TABLET | Freq: Every day | ORAL | 0 refills | Status: DC

## 2018-08-09 NOTE — Progress Notes (Signed)
Subjective:    Patient ID: Whitney [name], female    DOB: April 14, 1927, 85 y.o.   MRN: [id number]  HPI  Wt Readings from Last 3 Encounters:  07/05/18 170 lb (77.1 kg)  06/27/18 167 lb (75.8 kg)  05/31/18 174 lb (78.9 kg)   Patient presents today complaining of swelling in her legs and in her feet bilaterally.  This is been an issue for her in the past.  2018 she required high-dose Lasix 80 mg BID in addition to Zaroxolyn.  At present she is on Lasix 80 mg poqam.  It is listed on her medication list as 40 mg twice a day however she has been dosing at all in the morning and not taking any in the afternoon.. Today her weight is 5 lbs heavier than her last visit.  She denies chest pain.  She denies shortness of breath.  She denies orthopnea.  However she is taken 80 mg of Lasix this morning and has seen no improvement in her leg swelling.  She has +2 edema in both legs distal to the knee including the ankle and feet.  There is no erythema or pain in her legs.  Her lungs are clear to auscultation bilaterally.  She is in A. fib however her heart rate is controlled and does not show evidence of RVR.  Patient recently broke her wrist and is in a sling.  Lab work was drawn at the hospital at that time and her renal function was stable with a creatinine around 1.5.  She denies any decrease in urine output.  She does have a history of diabetes mellitus type 2 as well as chronic kidney disease. Past Medical History:  Diagnosis Date  . Anemia   . Atrial fibrillation ([place])   . Breast cancer ([place]) 10/2013   right upper outer  . Cancer ([place])    right breast  . Complication of anesthesia    slow to wake up  . Diabetes mellitus without complication ([place])   . Dysrhythmia 10/15   AF  . Former smoker   . Full dentures   . Hyperlipidemia   . Hypertension   . Multinodular goiter   . Radiation    Right Breast  . Thyroid disease    hypothyroidism  . Wears glasses    Past Surgical History:  Procedure  Laterality Date  . ABDOMINAL HYSTERECTOMY    . AXILLARY LYMPH NODE DISSECTION Right 01/09/2014   Procedure: RIGHT AXILLARY LYMPH NODE DISECTION;  Surgeon: [name], MD;  Location: [facility];  Service: General;  Laterality: Right;  . BREAST LUMPECTOMY WITH RADIOACTIVE SEED LOCALIZATION Right 01/09/2014   Procedure: RIGHT BREAST SEED LOCALIZED LUMPECTOMY ;  Surgeon: [name], MD;  Location: [facility];  Service: General;  Laterality: Right;  . EYE SURGERY  12/22/2009   cataracts  . INTRAMEDULLARY (IM) NAIL INTERTROCHANTERIC Left 02/24/2014   Procedure: IM ROD LEFT HIP FX;  Surgeon: Alta Corning, MD;  Location: [facility];  Service: Orthopedics;  Laterality: Left;  . PORT-A-CATH REMOVAL Right 01/09/2014   Procedure: REMOVAL PORT-A-CATH;  Surgeon: [name], MD;  Location: [facility];  Service: General;  Laterality: Right;  . PORTACATH PLACEMENT N/A 11/15/2013   Procedure: INSERTION PORT-A-CATH WITH ULTRA SOUND AND FLOROSCOPY;  Surgeon: [name], MD;  Location: [facility];  Service: General;  Laterality: N/A;  . TOTAL KNEE ARTHROPLASTY  2002   left   Current Outpatient Medications on File Prior to Visit  Medication Sig Dispense Refill  . amiodarone (PACERONE) 200 MG tablet Take 1 tablet (200 mg total) by mouth daily. 90 tablet 1  . atorvastatin (LIPITOR) 20 MG tablet Take 1 tablet (20 mg total) by mouth daily. 90 tablet 3  . Blood Glucose Monitoring Suppl (ONETOUCH VERIO) w/Device KIT Use to test blood sugars daily. 1 kit 0  . Carboxymeth-Glycerin-Polysorb (REFRESH OPTIVE ADVANCED OP) Place 1 drop into both eyes 3 (three) times daily.    . diclofenac sodium (VOLTAREN) 1 % GEL APPLY 2 GRAMS TO AFFECTED AREA 4 TIMES A DAY 100 g 2  . doxazosin (CARDURA) 4 MG tablet TAKE 1 TABLET BY MOUTH EVERY DAY 90 tablet 2  . ELIQUIS 5 MG TABS tablet TAKE 1 TABLET TWICE A DAY 60 tablet 10  . ferrous sulfate 325 (65 FE) MG tablet Take 325 mg by mouth  daily with breakfast.    . furosemide (LASIX) 40 MG tablet Take 1 tablet (40 mg total) by mouth 2 (two) times daily. 180 tablet 2  . HYDROcodone-acetaminophen (NORCO/VICODIN) 5-325 MG tablet Take 1 tablet by mouth every 4 (four) hours as needed. 8 tablet 0  . KLOR-CON M20 20 MEQ tablet TAKE 1 TABLET BY MOUTH EVERY DAY 90 tablet 1  . lisinopril (PRINIVIL,ZESTRIL) 20 MG tablet TAKE 1 TABLET BY MOUTH EVERY DAY 90 tablet 3  . meclizine (ANTIVERT) 12.5 MG tablet TAKE 1 TABLET BY MOUTH DAILY AS NEEDED FOR DIZZINESS 30 tablet 2  . metoprolol succinate (TOPROL-XL) 100 MG 24 hr tablet TAKE 1 TABLET BY MOUTH TWICE A DAY WITH OR IMMEDIATELY FOLLOWING A MEAL 180 tablet 1  . ONETOUCH VERIO test strip CHECK BLOOD SUGAR EVERY DAY AS DIRECTED 100 each 1  . oxyCODONE (ROXICODONE) 5 MG immediate release tablet Take 1 tablet (5 mg total) by mouth every 6 (six) hours as needed for severe pain (stop tramadol). 30 tablet 0  . potassium chloride (K-DUR) 10 MEQ tablet Take 1 tablet (10 mEq total) by mouth daily. 90 tablet 3  . Sennosides (EX-LAX) 15 MG TABS Take 1 tablet by mouth daily as needed (CONSTIPATION).    Marland Kitchen sitaGLIPtin (JANUVIA) 100 MG tablet Take 1 tablet (100 mg total) by mouth daily. 90 tablet 1  . tamoxifen (NOLVADEX) 20 MG tablet Take 1 tablet (20 mg total) by mouth daily. 90 tablet 3  . traMADol (ULTRAM) 50 MG tablet Take 1 tablet (50 mg total) by mouth every 8 (eight) hours as needed. 60 tablet 0   No current facility-administered medications on file prior to visit.    Allergies  Allergen Reactions  . Tape Other (See Comments)    Skin is somewhat sensitive   Social History   Socioeconomic History  . Marital status: Widowed    Spouse name: Not on file  . Number of children: 4  . Years of education: Not on file  . Highest education level: Not on file  Occupational History  . Not on file  Social Needs  . Financial resource strain: Not on file  . Food insecurity:    Worry: Not on file     Inability: Not on file  . Transportation needs:    Medical: Not on file    Non-medical: Not on file  Tobacco Use  . Smoking status: Former Smoker    Packs/day: 1.00    Years: 5.00    Pack years: 5.00    Last attempt to quit: 11/14/1958    Years since quitting: 59.7  . Smokeless tobacco: Never  Used  Substance and Sexual Activity  . Alcohol use: No    Alcohol/week: 0.0 standard drinks  . Drug use: No  . Sexual activity: Never    Comment: menarche age 47, P55, first birth age 56, no HRT, menopause age 31  Lifestyle  . Physical activity:    Days per week: Not on file    Minutes per session: Not on file  . Stress: Not on file  Relationships  . Social connections:    Talks on phone: Not on file    Gets together: Not on file    Attends religious service: Not on file    Active member of club or organization: Not on file    Attends meetings of clubs or organizations: Not on file    Relationship status: Not on file  . Intimate partner violence:    Fear of current or ex partner: Not on file    Emotionally abused: Not on file    Physically abused: Not on file    Forced sexual activity: Not on file  Other Topics Concern  . Not on file  Social History Narrative  . Not on file    eReview of Systems  All other systems reviewed and are negative.      Objective:   Physical Exam  Constitutional: She is oriented to person, place, and time. She appears well-developed and well-nourished. No distress.  Neck: Neck supple. No JVD present.  Cardiovascular: Normal rate, normal heart sounds and intact distal pulses. An irregularly irregular rhythm present.  No murmur heard. Pulmonary/Chest: Effort normal and breath sounds normal. No respiratory distress. She has no wheezes. She has no rales.  Abdominal: Soft. Bowel sounds are normal. She exhibits no distension. There is no abdominal tenderness. There is no rebound and no guarding.  Musculoskeletal:        General: Edema present.   Lymphadenopathy:    She has no cervical adenopathy.  Neurological: She is alert and oriented to person, place, and time. No cranial nerve deficit. She exhibits normal muscle tone. Coordination normal.  Skin: No rash noted. She is not diaphoretic.  Psychiatric: She has a normal mood and affect. Her behavior is normal. Judgment and thought content normal.  Vitals reviewed.         Assessment & Plan:  Chronic diastolic CHF (congestive heart failure) (HCC)  Leg swelling  I have recommended the patient take Zaroxolyn 5 mg immediately.  In 30 minutes I want her to take an additional 80 mg of Lasix.  I want her to repeat this process tomorrow morning by taking Zaroxolyn first thing in the morning and then taking 80 mg of Lasix immediately thereafter.  I will see the patient back on Thursday to assess for improvement in her leg swelling.  If she develops chest pain or shortness of breath I recommended that she go to the hospital immediately.

## 2018-08-11 ENCOUNTER — Other Ambulatory Visit: Payer: Self-pay

## 2018-08-11 ENCOUNTER — Ambulatory Visit (INDEPENDENT_AMBULATORY_CARE_PROVIDER_SITE_OTHER): Payer: Medicare Other | Admitting: Family Medicine

## 2018-08-11 ENCOUNTER — Encounter: Payer: Self-pay | Admitting: Family Medicine

## 2018-08-11 VITALS — BP 96/60 | HR 98 | Temp 98.7°F | Resp 14 | Ht 66.5 in | Wt 169.0 lb

## 2018-08-11 DIAGNOSIS — I5032 Chronic diastolic (congestive) heart failure: Secondary | ICD-10-CM | POA: Diagnosis not present

## 2018-08-11 DIAGNOSIS — M7989 Other specified soft tissue disorders: Secondary | ICD-10-CM | POA: Diagnosis not present

## 2018-08-11 DIAGNOSIS — Z780 Asymptomatic menopausal state: Secondary | ICD-10-CM | POA: Diagnosis not present

## 2018-08-11 NOTE — Progress Notes (Signed)
Subjective:    Patient ID: Whitney [name], female    DOB: 08-23-1927, 85 y.o.   MRN: [id number]  HPI  Wt Readings from Last 3 Encounters:  08/11/18 169 lb (76.7 kg)  08/09/18 175 lb (79.4 kg)  07/05/18 170 lb (77.1 kg)  08/09/18 Patient presents today complaining of swelling in her legs and in her feet bilaterally.  This is been an issue for her in the past.  2018 she required high-dose Lasix 80 mg BID in addition to Zaroxolyn.  At present she is on Lasix 80 mg poqam.  It is listed on her medication list as 40 mg twice a day however she has been dosing at all in the morning and not taking any in the afternoon.. Today her weight is 5 lbs heavier than her last visit.  She denies chest pain.  She denies shortness of breath.  She denies orthopnea.  However she is taken 80 mg of Lasix this morning and has seen no improvement in her leg swelling.  She has +2 edema in both legs distal to the knee including the ankle and feet.  There is no erythema or pain in her legs.  Her lungs are clear to auscultation bilaterally.  She is in A. fib however her heart rate is controlled and does not show evidence of RVR.  Patient recently broke her wrist and is in a sling.  Lab work was drawn at the hospital at that time and her renal function was stable with a creatinine around 1.5.  She denies any decrease in urine output.  She does have a history of diabetes mellitus type 2 as well as chronic kidney disease.  At that time, my plan was: I have recommended the patient take Zaroxolyn 5 mg immediately.  In 30 minutes I want her to take an additional 80 mg of Lasix.  I want her to repeat this process tomorrow morning by taking Zaroxolyn first thing in the morning and then taking 80 mg of Lasix immediately thereafter.  I will see the patient back on Thursday to assess for improvement in her leg swelling.  If she develops chest pain or shortness of breath I recommended that she go to the hospital immediately.  08/11/18  Patient has lost 6 pounds in the last 2 days due to diuresis!  The swelling has improved dramatically although she still has +1 edema from her mid shin to her feet.  She denies any shortness of breath.  She denies any chest pain.  She denies any orthopnea or paroxysmal nocturnal dyspnea.  She is already taken a dose of the Zaroxolyn this morning which would make 3 days in a row and she is also taken her 80 mg of Lasix already this morning.  Therefore I anticipate that she will lose an additional 2 to 3 pounds today which would likely get her back to her euvolemic weight.  Still not wearing compression hose Past Medical History:  Diagnosis Date  . Anemia   . Atrial fibrillation ([place])   . Breast cancer ([place]) 10/2013   right upper outer  . Cancer ([place])    right breast  . Complication of anesthesia    slow to wake up  . Diabetes mellitus without complication ([place])   . Dysrhythmia 10/15   AF  . Former smoker   . Full dentures   . Hyperlipidemia   . Hypertension   . Multinodular goiter   . Radiation    Right Breast  .  Thyroid disease    hypothyroidism  . Wears glasses    Past Surgical History:  Procedure Laterality Date  . ABDOMINAL HYSTERECTOMY    . AXILLARY LYMPH NODE DISSECTION Right 01/09/2014   Procedure: RIGHT AXILLARY LYMPH NODE DISECTION;  Surgeon: [name], MD;  Location: [facility];  Service: General;  Laterality: Right;  . BREAST LUMPECTOMY WITH RADIOACTIVE SEED LOCALIZATION Right 01/09/2014   Procedure: RIGHT BREAST SEED LOCALIZED LUMPECTOMY ;  Surgeon: [name], MD;  Location: [facility];  Service: General;  Laterality: Right;  . EYE SURGERY  12/22/2009   cataracts  . INTRAMEDULLARY (IM) NAIL INTERTROCHANTERIC Left 02/24/2014   Procedure: IM ROD LEFT HIP FX;  Surgeon: Alta Corning, MD;  Location: [facility];  Service: Orthopedics;  Laterality: Left;  . PORT-A-CATH REMOVAL Right 01/09/2014   Procedure: REMOVAL PORT-A-CATH;  Surgeon:  [name], MD;  Location: [facility];  Service: General;  Laterality: Right;  . PORTACATH PLACEMENT N/A 11/15/2013   Procedure: INSERTION PORT-A-CATH WITH ULTRA SOUND AND FLOROSCOPY;  Surgeon: [name], MD;  Location: [facility];  Service: General;  Laterality: N/A;  . TOTAL KNEE ARTHROPLASTY  2002   left   Current Outpatient Medications on File Prior to Visit  Medication Sig Dispense Refill  . amiodarone (PACERONE) 200 MG tablet Take 1 tablet (200 mg total) by mouth daily. 90 tablet 1  . atorvastatin (LIPITOR) 20 MG tablet Take 1 tablet (20 mg total) by mouth daily. 90 tablet 3  . Blood Glucose Monitoring Suppl (ONETOUCH VERIO) w/Device KIT Use to test blood sugars daily. 1 kit 0  . Carboxymeth-Glycerin-Polysorb (REFRESH OPTIVE ADVANCED OP) Place 1 drop into both eyes 3 (three) times daily.    . diclofenac sodium (VOLTAREN) 1 % GEL APPLY 2 GRAMS TO AFFECTED AREA 4 TIMES A DAY 100 g 2  . doxazosin (CARDURA) 4 MG tablet TAKE 1 TABLET BY MOUTH EVERY DAY 90 tablet 2  . ELIQUIS 5 MG TABS tablet TAKE 1 TABLET TWICE A DAY 60 tablet 10  . ferrous sulfate 325 (65 FE) MG tablet Take 325 mg by mouth daily with breakfast.    . furosemide (LASIX) 40 MG tablet Take 1 tablet (40 mg total) by mouth 2 (two) times daily. 180 tablet 2  . HYDROcodone-acetaminophen (NORCO/VICODIN) 5-325 MG tablet Take 1 tablet by mouth every 4 (four) hours as needed. 8 tablet 0  . KLOR-CON M20 20 MEQ tablet TAKE 1 TABLET BY MOUTH EVERY DAY 90 tablet 1  . lisinopril (PRINIVIL,ZESTRIL) 20 MG tablet TAKE 1 TABLET BY MOUTH EVERY DAY 90 tablet 3  . meclizine (ANTIVERT) 12.5 MG tablet TAKE 1 TABLET BY MOUTH DAILY AS NEEDED FOR DIZZINESS 30 tablet 2  . metolazone (ZAROXOLYN) 5 MG tablet Take 1 tablet (5 mg total) by mouth daily. 10 tablet 0  . metoprolol succinate (TOPROL-XL) 100 MG 24 hr tablet TAKE 1 TABLET BY MOUTH TWICE A DAY WITH OR IMMEDIATELY FOLLOWING A MEAL 180 tablet 1  . ONETOUCH VERIO test strip CHECK  BLOOD SUGAR EVERY DAY AS DIRECTED 100 each 1  . oxyCODONE (ROXICODONE) 5 MG immediate release tablet Take 1 tablet (5 mg total) by mouth every 6 (six) hours as needed for severe pain (stop tramadol). 30 tablet 0  . potassium chloride (K-DUR) 10 MEQ tablet Take 1 tablet (10 mEq total) by mouth daily. 90 tablet 3  . Sennosides (EX-LAX) 15 MG TABS Take 1 tablet by mouth daily as needed (CONSTIPATION).    Marland Kitchen  sitaGLIPtin (JANUVIA) 100 MG tablet Take 1 tablet (100 mg total) by mouth daily. 90 tablet 1  . tamoxifen (NOLVADEX) 20 MG tablet Take 1 tablet (20 mg total) by mouth daily. 90 tablet 3  . traMADol (ULTRAM) 50 MG tablet Take 1 tablet (50 mg total) by mouth every 8 (eight) hours as needed. 60 tablet 0   No current facility-administered medications on file prior to visit.    Allergies  Allergen Reactions  . Tape Other (See Comments)    Skin is somewhat sensitive   Social History   Socioeconomic History  . Marital status: Widowed    Spouse name: Not on file  . Number of children: 4  . Years of education: Not on file  . Highest education level: Not on file  Occupational History  . Not on file  Social Needs  . Financial resource strain: Not on file  . Food insecurity:    Worry: Not on file    Inability: Not on file  . Transportation needs:    Medical: Not on file    Non-medical: Not on file  Tobacco Use  . Smoking status: Former Smoker    Packs/day: 1.00    Years: 5.00    Pack years: 5.00    Last attempt to quit: 11/14/1958    Years since quitting: 59.7  . Smokeless tobacco: Never Used  Substance and Sexual Activity  . Alcohol use: No    Alcohol/week: 0.0 standard drinks  . Drug use: No  . Sexual activity: Never    Comment: menarche age 31, P86, first birth age 57, no HRT, menopause age 74  Lifestyle  . Physical activity:    Days per week: Not on file    Minutes per session: Not on file  . Stress: Not on file  Relationships  . Social connections:    Talks on phone: Not  on file    Gets together: Not on file    Attends religious service: Not on file    Active member of club or organization: Not on file    Attends meetings of clubs or organizations: Not on file    Relationship status: Not on file  . Intimate partner violence:    Fear of current or ex partner: Not on file    Emotionally abused: Not on file    Physically abused: Not on file    Forced sexual activity: Not on file  Other Topics Concern  . Not on file  Social History Narrative  . Not on file    eReview of Systems  All other systems reviewed and are negative.      Objective:   Physical Exam  Constitutional: She is oriented to person, place, and time. She appears well-developed and well-nourished. No distress.  Neck: Neck supple. No JVD present.  Cardiovascular: Normal rate, normal heart sounds and intact distal pulses. An irregularly irregular rhythm present.  No murmur heard. Pulmonary/Chest: Effort normal and breath sounds normal. No respiratory distress. She has no wheezes. She has no rales.  Abdominal: Soft. Bowel sounds are normal. She exhibits no distension. There is no abdominal tenderness. There is no rebound and no guarding.  Musculoskeletal:        General: Edema present.  Lymphadenopathy:    She has no cervical adenopathy.  Neurological: She is alert and oriented to person, place, and time. No cranial nerve deficit. She exhibits normal muscle tone. Coordination normal.  Skin: No rash noted. She is not diaphoretic.  Psychiatric: She  has a normal mood and affect. Her behavior is normal. Judgment and thought content normal.  Vitals reviewed.         Assessment & Plan:   Chronic diastolic CHF (congestive heart failure) (HCC) - Plan: BASIC METABOLIC PANEL WITH GFR  Leg swelling - Plan: BASIC METABOLIC PANEL WITH GFR  Postmenopausal estrogen deficiency - Plan: DG Bone Density  I have asked the patient to discontinue Zaroxolyn to avoid prerenal azotemia.  I want her  to continue her baseline dose of Lasix 80 mg a day.  I anticipate that she will lose another 2 to 3 pounds today which will get her back to her euvolemic weight.  I also recommended that she buy a pair of knee-high compression hose 15 to 20 mmHg pressure and begin to wear those every day to help prevent reaccumulation of fluid in the future.  Her family will call me if she gains more than 2 pounds in a 24-hour period or if the swelling returns.

## 2018-08-12 ENCOUNTER — Encounter: Payer: Self-pay | Admitting: Family Medicine

## 2018-08-12 LAB — BASIC METABOLIC PANEL WITH GFR
BUN/Creatinine Ratio: 18 (calc) (ref 6–22)
BUN: 31 mg/dL — ABNORMAL HIGH (ref 7–25)
CO2: 28 mmol/L (ref 20–32)
Calcium: 8.8 mg/dL (ref 8.6–10.4)
Chloride: 102 mmol/L (ref 98–110)
Creat: 1.75 mg/dL — ABNORMAL HIGH (ref 0.60–0.88)
GFR, Est African American: 30 mL/min/{1.73_m2} — ABNORMAL LOW (ref >=60)
GFR, Est Non African American: 26 mL/min/{1.73_m2} — ABNORMAL LOW (ref >=60)
Glucose, Bld: 189 mg/dL — ABNORMAL HIGH (ref 65–99)
Potassium: 3.9 mmol/L (ref 3.5–5.3)
Sodium: 140 mmol/L (ref 135–146)

## 2018-08-15 DIAGNOSIS — S52502D Unspecified fracture of the lower end of left radius, subsequent encounter for closed fracture with routine healing: Secondary | ICD-10-CM | POA: Diagnosis not present

## 2018-08-15 DIAGNOSIS — Z4789 Encounter for other orthopedic aftercare: Secondary | ICD-10-CM | POA: Diagnosis not present

## 2018-08-15 DIAGNOSIS — M25532 Pain in left wrist: Secondary | ICD-10-CM | POA: Diagnosis not present

## 2018-08-18 ENCOUNTER — Other Ambulatory Visit (INDEPENDENT_AMBULATORY_CARE_PROVIDER_SITE_OTHER): Payer: Self-pay | Admitting: Physician Assistant

## 2018-08-19 NOTE — Progress Notes (Signed)
Patient ID: Whitney [name], female   DOB: 06/25/1927, 85 y.o.   MRN: [id number]  This patient is diagnosed with osteoarthritis of the knee(s).    Radiographs show evidence of joint space narrowing, osteophytes, subchondral sclerosis and/or subchondral cysts.  This patient has knee pain which interferes with functional and activities of daily living.    This patient has experienced inadequate response, adverse effects and/or intolerance with conservative treatments such as acetaminophen, NSAIDS, topical creams, physical therapy or regular exercise, knee bracing and/or weight loss.   This patient has experienced inadequate response or has a contraindication to intra articular steroid injections for at least 3 months.   This patient is not scheduled to have a total knee replacement within 6 months of starting treatment with viscosupplementation.

## 2018-08-22 NOTE — Telephone Encounter (Signed)
error 

## 2018-09-02 DIAGNOSIS — M25532 Pain in left wrist: Secondary | ICD-10-CM | POA: Diagnosis not present

## 2018-09-05 DIAGNOSIS — S52502D Unspecified fracture of the lower end of left radius, subsequent encounter for closed fracture with routine healing: Secondary | ICD-10-CM | POA: Diagnosis not present

## 2018-09-06 ENCOUNTER — Telehealth: Payer: Self-pay | Admitting: Radiology

## 2018-09-06 ENCOUNTER — Other Ambulatory Visit: Payer: Self-pay

## 2018-09-06 ENCOUNTER — Encounter: Payer: Self-pay | Admitting: Orthopaedic Surgery

## 2018-09-06 ENCOUNTER — Ambulatory Visit (INDEPENDENT_AMBULATORY_CARE_PROVIDER_SITE_OTHER): Payer: Medicare Other | Admitting: Orthopaedic Surgery

## 2018-09-06 DIAGNOSIS — M1711 Unilateral primary osteoarthritis, right knee: Secondary | ICD-10-CM

## 2018-09-06 NOTE — Progress Notes (Signed)
Office Visit Note   Patient: Whitney [name]           Date of Birth: 01-26-1928           MRN: [id number] Visit Date: 09/06/2018              Requested by: [name], MD [address] [place],  [place] [id number] PCP: [name], MD   Assessment & Plan: Visit Diagnoses:  1. Unilateral primary osteoarthritis, right knee     Plan: Impression is severe osteoarthritis to the right knee.  The patient does have underlying diabetes, congestive heart failure and A. fib currently on Eliquis.  We have discussed definitive treatment to include hardware removal from her previous tibial plateau fracture and total knee replacement.  This would likely be staged.  Due to her underlying comorbidities as well as her age, she would like to treat this conservatively for as long as possible.  We will obtain approval for Visco supplementation injection.  She will follow-up with [place] once she has been approved.  Call with concerns or questions in the meantime.  Follow-Up Instructions: Return for right knee gel shot.   Orders:  No orders of the defined types were placed in this encounter.  No orders of the defined types were placed in this encounter.     Procedures: No procedures performed   Clinical Data: No additional findings.   Subjective: Chief Complaint  Patient presents with  . Right Knee - Pain    HPI patient is a pleasant 85 year old female who presents our clinic today with recurrent right knee pain.  History of advanced arthritis to the right knee joint.  She has had pain for several years.  She has had multiple cortisone and viscosupplementation injections.  Her last viscosupplementation injection was on 04/12/2018 and her last cortisone injection was on 06/30/2018.  She noticed relief following these injections, but neither were long-lasting.  The pain she has is to the entire knee and occurs only with activity.  She denies any pain at rest or while sleeping.  She  takes over-the-counter medications without significant relief of symptoms.  Review of Systems as detailed in HPI.  All others reviewed and are negative.   Objective: Vital Signs: There were no vitals taken for this visit.  Physical Exam well-developed and well-nourished female no acute distress.  Alert oriented x3.  Ortho Exam stable exam of the right knee  Specialty Comments:  No specialty comments available.  Imaging: No new imaging   PMFS History: Patient Active Problem List   Diagnosis Date Noted  . Unilateral primary osteoarthritis, right knee 03/25/2018  . Chronic pain of right knee 05/25/2017  . Bacteremia 10/25/2016  . Transaminitis 10/23/2016  . Sinus bradycardia 10/23/2016  . Controlled diabetes mellitus type 2 with complications ([place])   . Paroxysmal atrial fibrillation (HCC)   . Chronic diastolic CHF (congestive heart failure) ([place])   . Pulmonary hypertension ([place])   . Acute renal failure superimposed on stage 2 chronic kidney disease ([place])   . Elevated liver enzymes   . Bacteremia due to Escherichia coli 12/30/2015  . UTI (urinary tract infection) 12/30/2015  . Acute blood loss anemia 02/25/2014  . Closed left hip fracture ([facility]) 02/24/2014  . PAF (paroxysmal atrial fibrillation) ([place]) 01/30/2014  . Anticoagulated 01/30/2014  . Chronic diastolic CHF (congestive heart failure), NYHA class 2 ([place]) 12/10/2013  . Preoperative cardiovascular examination 12/10/2013  . Neutropenic fever ([place]) 12/08/2013  . Anemia  of chronic disease 12/08/2013  . Thrombocytopenia ([place]) 12/08/2013  . Hypokalemia 12/08/2013  . Pancytopenia due to antineoplastic chemotherapy ([place]) 12/08/2013  . Hypothyroidism 12/08/2013  . Breast cancer of upper-outer quadrant of right female breast ([place]) 11/01/2013  . Essential hypertension, benign 12/01/2012  . Pure hypercholesterolemia 12/01/2012  . DM2 (diabetes mellitus, type 2) ([place]) 11/21/2012   Past Medical History:  Diagnosis Date  .  Anemia   . Atrial fibrillation ([place])   . Breast cancer ([place]) 10/2013   right upper outer  . Cancer ([place])    right breast  . Complication of anesthesia    slow to wake up  . Diabetes mellitus without complication ([place])   . Dysrhythmia 10/15   AF  . Former smoker   . Full dentures   . Hyperlipidemia   . Hypertension   . Multinodular goiter   . Radiation    Right Breast  . Thyroid disease    hypothyroidism  . Wears glasses     History reviewed. No pertinent family history.  Past Surgical History:  Procedure Laterality Date  . ABDOMINAL HYSTERECTOMY    . AXILLARY LYMPH NODE DISSECTION Right 01/09/2014   Procedure: RIGHT AXILLARY LYMPH NODE DISECTION;  Surgeon: [name], MD;  Location: [facility];  Service: General;  Laterality: Right;  . BREAST LUMPECTOMY WITH RADIOACTIVE SEED LOCALIZATION Right 01/09/2014   Procedure: RIGHT BREAST SEED LOCALIZED LUMPECTOMY ;  Surgeon: [name], MD;  Location: [facility];  Service: General;  Laterality: Right;  . EYE SURGERY  12/22/2009   cataracts  . INTRAMEDULLARY (IM) NAIL INTERTROCHANTERIC Left 02/24/2014   Procedure: IM ROD LEFT HIP FX;  Surgeon: Alta Corning, MD;  Location: [facility];  Service: Orthopedics;  Laterality: Left;  . left distal radius fracture     2020  . PORT-A-CATH REMOVAL Right 01/09/2014   Procedure: REMOVAL PORT-A-CATH;  Surgeon: [name], MD;  Location: [facility];  Service: General;  Laterality: Right;  . PORTACATH PLACEMENT N/A 11/15/2013   Procedure: INSERTION PORT-A-CATH WITH ULTRA SOUND AND FLOROSCOPY;  Surgeon: [name], MD;  Location: [facility];  Service: General;  Laterality: N/A;  . TOTAL KNEE ARTHROPLASTY  2002   left   Social History   Occupational History  . Not on file  Tobacco Use  . Smoking status: Former Smoker    Packs/day: 1.00    Years: 5.00    Pack years: 5.00    Quit date: 11/14/1958    Years since quitting: 59.8  . Smokeless  tobacco: Never Used  Substance and Sexual Activity  . Alcohol use: No    Alcohol/week: 0.0 standard drinks  . Drug use: No  . Sexual activity: Never    Comment: menarche age 55, P29, first birth age 32, no HRT, menopause age 24

## 2018-09-06 NOTE — Telephone Encounter (Signed)
Requests approval for right knee gel injection  Thanks.

## 2018-09-09 NOTE — Telephone Encounter (Signed)
Printed demographics for submission.  

## 2018-09-14 DIAGNOSIS — M25532 Pain in left wrist: Secondary | ICD-10-CM | POA: Diagnosis not present

## 2018-09-19 ENCOUNTER — Telehealth: Payer: Self-pay

## 2018-09-19 NOTE — Telephone Encounter (Signed)
Patient is Approved for Durolane, right knee. [place] Patient will be responsible for 20% OOP. No Co-pay No PA required  Appt. 09/23/2018 with Dr. Erlinda Hong.

## 2018-09-23 ENCOUNTER — Encounter: Payer: Self-pay | Admitting: Orthopaedic Surgery

## 2018-09-23 ENCOUNTER — Ambulatory Visit (INDEPENDENT_AMBULATORY_CARE_PROVIDER_SITE_OTHER): Payer: Medicare Other | Admitting: Orthopaedic Surgery

## 2018-09-23 ENCOUNTER — Other Ambulatory Visit: Payer: Self-pay

## 2018-09-23 VITALS — Ht 66.5 in | Wt 169.0 lb

## 2018-09-23 DIAGNOSIS — M1711 Unilateral primary osteoarthritis, right knee: Secondary | ICD-10-CM | POA: Diagnosis not present

## 2018-09-23 MED ORDER — SODIUM HYALURONATE 60 MG/3ML IX PRSY
60.0000 mg | PREFILLED_SYRINGE | INTRA_ARTICULAR | Status: AC | PRN
  Administered 2018-09-23: 60 mg via INTRA_ARTICULAR

## 2018-09-23 MED ORDER — BUPIVACAINE HCL 0.25 % IJ SOLN
2.0000 mL | INTRAMUSCULAR | Status: AC | PRN
  Administered 2018-09-23: 2 mL via INTRA_ARTICULAR

## 2018-09-23 MED ORDER — LIDOCAINE HCL 1 % IJ SOLN
2.0000 mL | INTRAMUSCULAR | Status: AC | PRN
  Administered 2018-09-23: 2 mL

## 2018-09-23 NOTE — Progress Notes (Addendum)
   Procedure Note  Patient: Whitney [name]             Date of Birth: 09/28/1927           MRN: [id number]             Visit Date: 09/23/2018  Procedures: Visit Diagnoses:  1. Unilateral primary osteoarthritis, right knee     Large Joint Inj: R knee on 09/23/2018 10:44 AM Indications: pain Details: 22 G needle, anterolateral approach Medications: 2 mL bupivacaine 0.25 %; 60 mg Sodium Hyaluronate 60 MG/3ML; 2 mL lidocaine 1 %     3 cc of Durolane injection given today.

## 2018-10-04 ENCOUNTER — Other Ambulatory Visit: Payer: Self-pay | Admitting: Hematology and Oncology

## 2018-10-04 DIAGNOSIS — Z853 Personal history of malignant neoplasm of breast: Secondary | ICD-10-CM

## 2018-10-06 ENCOUNTER — Other Ambulatory Visit: Payer: Self-pay | Admitting: Family Medicine

## 2018-10-07 ENCOUNTER — Other Ambulatory Visit: Payer: Self-pay

## 2018-10-07 ENCOUNTER — Telehealth: Payer: Self-pay | Admitting: Hematology and Oncology

## 2018-10-07 ENCOUNTER — Ambulatory Visit
Admission: RE | Admit: 2018-10-07 | Discharge: 2018-10-07 | Disposition: A | Discharge status: Home / Self Care | Payer: Medicare Other | Source: Ambulatory Visit | Attending: Family Medicine | Admitting: Family Medicine

## 2018-10-07 DIAGNOSIS — M810 Age-related osteoporosis without current pathological fracture: Secondary | ICD-10-CM | POA: Diagnosis not present

## 2018-10-07 DIAGNOSIS — M8588 Other specified disorders of bone density and structure, other site: Secondary | ICD-10-CM | POA: Diagnosis not present

## 2018-10-07 NOTE — Telephone Encounter (Signed)
I called the patient reviewed the results of the bone density test which showed a T score of -3.4 which showed severe osteoporosis.  However there was no answer and there was no place to leave a message.

## 2018-10-10 ENCOUNTER — Encounter: Payer: Self-pay | Admitting: Family Medicine

## 2018-10-10 DIAGNOSIS — M810 Age-related osteoporosis without current pathological fracture: Secondary | ICD-10-CM | POA: Insufficient documentation

## 2018-10-13 ENCOUNTER — Telehealth: Payer: Self-pay

## 2018-10-13 MED ORDER — CALCIUM 1200 1200-1000 MG-UNIT PO CHEW: 1200.0000 mg | CHEWABLE_TABLET | Freq: Every day | ORAL | 3 refills | Status: DC

## 2018-10-13 MED ORDER — VITAMIN D 25 MCG (1000 UNIT) PO TABS: 1000.0000 [IU] | ORAL_TABLET | Freq: Every day | ORAL | 3 refills | Status: DC

## 2018-10-13 MED ORDER — DENOSUMAB 60 MG/ML ~~LOC~~ SOSY: 60.0000 mg | PREFILLED_SYRINGE | SUBCUTANEOUS | 0 refills | Status: DC

## 2018-10-13 NOTE — Telephone Encounter (Signed)
Error

## 2018-10-14 ENCOUNTER — Other Ambulatory Visit: Payer: Self-pay | Admitting: Family Medicine

## 2018-10-14 MED ORDER — CALCIUM 1200 1200-1000 MG-UNIT PO CHEW: 1200.0000 mg | CHEWABLE_TABLET | Freq: Every day | ORAL | 3 refills | Status: DC

## 2018-10-19 ENCOUNTER — Other Ambulatory Visit: Payer: Self-pay

## 2018-10-28 ENCOUNTER — Encounter: Payer: Self-pay | Admitting: Endocrinology

## 2018-10-28 ENCOUNTER — Other Ambulatory Visit: Payer: Self-pay

## 2018-10-28 ENCOUNTER — Ambulatory Visit: Payer: Medicare Other | Admitting: Endocrinology

## 2018-10-28 VITALS — BP 136/70 | HR 60 | Ht 66.5 in | Wt 167.2 lb

## 2018-10-28 DIAGNOSIS — E559 Vitamin D deficiency, unspecified: Secondary | ICD-10-CM

## 2018-10-28 DIAGNOSIS — N183 Chronic kidney disease, stage 3 unspecified: Secondary | ICD-10-CM

## 2018-10-28 DIAGNOSIS — E042 Nontoxic multinodular goiter: Secondary | ICD-10-CM

## 2018-10-28 DIAGNOSIS — M810 Age-related osteoporosis without current pathological fracture: Secondary | ICD-10-CM | POA: Diagnosis not present

## 2018-10-28 DIAGNOSIS — E119 Type 2 diabetes mellitus without complications: Secondary | ICD-10-CM

## 2018-10-28 LAB — GLUCOSE, POCT (MANUAL RESULT ENTRY): POC Glucose: 134 mg/dl — AB (ref 70–99)

## 2018-10-28 LAB — COMPREHENSIVE METABOLIC PANEL
ALT: 16 U/L (ref 0–35)
AST: 22 U/L (ref 0–37)
Albumin: 3.3 g/dL — ABNORMAL LOW (ref 3.5–5.2)
Alkaline Phosphatase: 43 U/L (ref 39–117)
BUN: 18 mg/dL (ref 6–23)
CO2: 30 mEq/L (ref 19–32)
Calcium: 8.6 mg/dL (ref 8.4–10.5)
Chloride: 105 mEq/L (ref 96–112)
Creatinine, Ser: 1.29 mg/dL — ABNORMAL HIGH (ref 0.40–1.20)
GFR: 47.12 mL/min — ABNORMAL LOW (ref >60.00)
Glucose, Bld: 146 mg/dL — ABNORMAL HIGH (ref 70–99)
Potassium: 3.6 mEq/L (ref 3.5–5.1)
Sodium: 142 mEq/L (ref 135–145)
Total Bilirubin: 0.5 mg/dL (ref 0.2–1.2)
Total Protein: 6 g/dL (ref 6.0–8.3)

## 2018-10-28 LAB — VITAMIN D 25 HYDROXY (VIT D DEFICIENCY, FRACTURES): VITD: 24.74 ng/mL — ABNORMAL LOW (ref 30.00–100.00)

## 2018-10-28 LAB — T4, FREE: Free T4: 1.58 ng/dL (ref 0.60–1.60)

## 2018-10-28 LAB — POCT GLYCOSYLATED HEMOGLOBIN (HGB A1C): Hemoglobin A1C: 5.7 % — AB (ref 4.0–5.6)

## 2018-10-28 LAB — TSH: TSH: 0.8 u[IU]/mL (ref 0.35–4.50)

## 2018-10-28 NOTE — Progress Notes (Signed)
Patient ID: Whitney [name], female   DOB: [birth year], 85 y.o.   MRN: [id number]   Reason for Appointment: Diabetes and other problems  History of Present Illness     Type 2 DIABETES MELITUS, date of diagnosis:  1983     Previous history: She had been taking metformin and Actos for several years.  She usually has had excellent control with upper normal A1c  Recent history:   Oral hypoglycemic drugs: Januvia 100 mg, half tablet daily    She was previously on  Metformin and Actos; because of edema she was told to stop Actos Her metformin was stopped because of renal dysfunction and Amaryl because of hypoglycemia  Her A1c is again excellent at 5.7   Current blood sugar patterns, management details:  Her blood sugars appear to be better at home than before with less variability  However she is again checking her blood sugars mostly around 6-7 PM after dinner and only occasionally in the afternoon  Her weight is about the same  She thinks she is eating healthy meals usually and is living with her granddaughter  However occasionally like yesterday she only ate cereal for suppertime but blood sugar was still fairly good afterwards  Taking Januvia regularly        Monitors blood glucose:   less than once a day .    Glucometer: One Touch.           Blood sugars reviewed from monitor download  Blood sugar range 101-175 with AVERAGE 139   Physical activity: exercise: Minimal         Wt Readings from Last 3 Encounters:  10/28/18 167 lb 3.2 oz (75.8 kg)  09/23/18 169 lb (76.7 kg)  08/11/18 169 lb (76.7 kg)    LABS:  Lab Results  Component Value Date   HGBA1C 5.7 (A) 10/28/2018   HGBA1C 5.9 (A) 06/27/2018   HGBA1C 6.4 (H) 04/18/2018   Lab Results  Component Value Date   MICROALBUR 6.1 04/18/2018   LDLCALC 59 04/18/2018   CREATININE 1.75 (H) 08/11/2018     OTHER problems  are discussed in review of systems      Allergies as of 10/28/2018    Reactions   Tape Other (See Comments)   Skin is somewhat sensitive      Medication List       Accurate as of October 28, 2018 10:38 AM. If you have any questions, ask your nurse or doctor.        amiodarone 200 MG tablet Commonly known as: PACERONE Take 1 tablet (200 mg total) by mouth daily.   atorvastatin 20 MG tablet Commonly known as: LIPITOR Take 1 tablet (20 mg total) by mouth daily.   Calcium 1200 1200-1000 MG-UNIT Chew Chew 1,200 mg by mouth daily.   cholecalciferol 25 MCG (1000 UT) tablet Commonly known as: VITAMIN D3 Take 1 tablet (1,000 Units total) by mouth daily.   denosumab 60 MG/ML Sosy injection Commonly known as: PROLIA Inject 60 mg into the skin every 6 (six) months.   diclofenac sodium 1 % Gel Commonly known as: VOLTAREN APPLY 2 GRAMS TO AFFECTED AREA 4 TIMES A DAY   doxazosin 4 MG tablet Commonly known as: CARDURA TAKE 1 TABLET BY MOUTH EVERY DAY   Eliquis 5 MG Tabs tablet Generic drug: apixaban TAKE 1 TABLET TWICE A DAY   Ex-Lax 15 MG Tabs Generic drug: Sennosides Take 1 tablet by mouth daily as needed (CONSTIPATION).   ferrous  sulfate 325 (65 FE) MG tablet Take 325 mg by mouth daily with breakfast.   furosemide 40 MG tablet Commonly known as: LASIX Take 1 tablet (40 mg total) by mouth 2 (two) times daily.   HYDROcodone-acetaminophen 5-325 MG tablet Commonly known as: NORCO/VICODIN Take 1 tablet by mouth every 4 (four) hours as needed.   Klor-Con M20 20 MEQ tablet Generic drug: potassium chloride SA TAKE 1 TABLET BY MOUTH EVERY DAY   lisinopril 20 MG tablet Commonly known as: ZESTRIL TAKE 1 TABLET BY MOUTH EVERY DAY   meclizine 12.5 MG tablet Commonly known as: ANTIVERT TAKE 1 TABLET BY MOUTH DAILY AS NEEDED FOR DIZZINESS   metolazone 5 MG tablet Commonly known as: ZAROXOLYN Take 1 tablet (5 mg total) by mouth daily.   metoprolol succinate 100 MG 24 hr tablet Commonly known as: TOPROL-XL TAKE 1 TABLET BY MOUTH TWICE A DAY  WITH OR IMMEDIATELY FOLLOWING A MEAL   OneTouch Verio test strip Generic drug: glucose blood CHECK BLOOD SUGAR EVERY DAY AS DIRECTED   OneTouch Verio w/Device Kit Use to test blood sugars daily.   oxyCODONE 5 MG immediate release tablet Commonly known as: Roxicodone Take 1 tablet (5 mg total) by mouth every 6 (six) hours as needed for severe pain (stop tramadol).   potassium chloride 10 MEQ tablet Commonly known as: K-DUR Take 1 tablet (10 mEq total) by mouth daily.   REFRESH OPTIVE ADVANCED OP Place 1 drop into both eyes 3 (three) times daily.   sitaGLIPtin 100 MG tablet Commonly known as: Januvia Take 1 tablet (100 mg total) by mouth daily.   tamoxifen 20 MG tablet Commonly known as: NOLVADEX Take 1 tablet (20 mg total) by mouth daily.   traMADol 50 MG tablet Commonly known as: ULTRAM Take 1 tablet (50 mg total) by mouth every 8 (eight) hours as needed.       Allergies:  Allergies  Allergen Reactions  . Tape Other (See Comments)    Skin is somewhat sensitive    Past Medical History:  Diagnosis Date  . Anemia   . Atrial fibrillation ([place])   . Breast cancer ([place]) 10/2013   right upper outer  . Cancer ([place])    right breast  . Complication of anesthesia    slow to wake up  . Diabetes mellitus without complication ([place])   . Dysrhythmia 10/15   AF  . Former smoker   . Full dentures   . Hyperlipidemia   . Hypertension   . Multinodular goiter   . Osteoporosis   . Radiation    Right Breast  . Thyroid disease    hypothyroidism  . Wears glasses     Past Surgical History:  Procedure Laterality Date  . ABDOMINAL HYSTERECTOMY    . AXILLARY LYMPH NODE DISSECTION Right 01/09/2014   Procedure: RIGHT AXILLARY LYMPH NODE DISECTION;  Surgeon: [name], MD;  Location: [facility];  Service: General;  Laterality: Right;  . BREAST LUMPECTOMY WITH RADIOACTIVE SEED LOCALIZATION Right 01/09/2014   Procedure: RIGHT BREAST SEED LOCALIZED LUMPECTOMY ;   Surgeon: [name], MD;  Location: [facility];  Service: General;  Laterality: Right;  . EYE SURGERY  12/22/2009   cataracts  . INTRAMEDULLARY (IM) NAIL INTERTROCHANTERIC Left 02/24/2014   Procedure: IM ROD LEFT HIP FX;  Surgeon: Alta Corning, MD;  Location: [facility];  Service: Orthopedics;  Laterality: Left;  . left distal radius fracture     2020  . PORT-A-CATH REMOVAL Right  01/09/2014   Procedure: REMOVAL PORT-A-CATH;  Surgeon: [name], MD;  Location: [facility];  Service: General;  Laterality: Right;  . PORTACATH PLACEMENT N/A 11/15/2013   Procedure: INSERTION PORT-A-CATH WITH ULTRA SOUND AND FLOROSCOPY;  Surgeon: [name], MD;  Location: [facility];  Service: General;  Laterality: N/A;  . TOTAL KNEE ARTHROPLASTY  2002   left    History reviewed. No pertinent family history.  Social History:  reports that she quit smoking about 59 years ago. She has a 5.00 pack-year smoking history. She has never used smokeless tobacco. She reports that she does not drink alcohol or use drugs.  Review of Systems:   Hypertension:  She is still taking lisinopril 20 mg daily, 2 mg doxazosin, 100 mg of metoprolol  Followed by PCP   EDEMA this is still present to a variable extent despite using elastic stockings  She is taking Lasix 80 mg daily for edema, prescribed by PCP Also has been given metolazone by Dr. Dennard Schaumann  Renal function: Creatinine has been variable, last reading was higher and has been referred to her PCP  Lab Results  Component Value Date   CREATININE 1.75 (H) 08/11/2018   CREATININE 1.49 (H) 07/05/2018   CREATININE 1.45 (H) 07/04/2018   Previously abnormal liver functions were better on repeat testing and hepatitis screening negative  Lab Results  Component Value Date   ALT 36 (H) 07/04/2018   ALT 8 12/22/2013     Lipids: Has been on long-term Lipitor with good control     Lab Results  Component Value Date   CHOL 141  04/18/2018   HDL 68 04/18/2018   LDLCALC 59 04/18/2018   TRIG 64 04/18/2018   CHOLHDL 2.1 04/18/2018    She has had vitamin D deficiency, Treated with 2000 units vitamin D 3 Needs follow-up level  OSTEOPOROSIS: Her bone density checked by her PCP indicated osteoporosis and she has had a wrist fracture Has been started on Prolia However she does also have renal dysfunction not followed by nephrologist No recent PTH level available  GOITER: She has had a multinodular goiter for several years which is quite small on exam  Levothyroxine supplements were stopped in 5/16 and she has continued to be euthyroid  Her atrial arrhythmias have been treated with 200 mg of amiodarone long-term   Lab Results  Component Value Date   TSH 0.39 06/27/2018   TSH 0.50 04/18/2018   TSH 0.68 10/29/2017   FREET4 1.60 06/27/2018   FREET4 1.47 10/29/2017   FREET4 1.17 06/29/2017    Last foot exam in 8/19   ADRENAL mass: Following is a copy of the previous note  She had a CT scan done previously and was found to have a left adrenal mass  She was evaluated previously by a PET scan after  diagnosis of breast cancer in 2015 She was found to have a 3.7 cm hypermetabolic left adrenal mass On a previous CT scan in 2011 she also had a 3.8 cm adrenal mass Cortisol level is normal in 2015 and 2016 No history of hypokalemia or severe hypertension/palpitations, headaches, flushing or significant fluctuations in blood pressure Had evaluation with metanephrines and overnight dexamethasone test indicating normal adrenal function   Examination:   BP 136/70 (BP Location: Left Arm, Patient Position: Sitting, Cuff Size: Normal)   Pulse 60   Ht 5' 6.5" (1.689 m)   Wt 167 lb 3.2 oz (75.8 kg)   SpO2 97%   BMI 26.58 kg/m  Body mass index is 26.58 kg/m.     ASSESSMENT/ PLAN:  Diabetes type 2   See history of present illness for  discussion of current diabetes management, blood sugar patterns and  problems identified  Her A1c is about the same at 5.7, previously was at 5.9  Her weight is stable and her diet has been generally fairly good As before she checks her sugars only after evening meal and not any other time despite reminders  She is on Januvia 50 mg However the dose of this may need to be adjusted since her creatinine clearance was below 30 on the last visit   Renal dysfunction: This will be checked today  OSTEOPOROSIS, history of recent wrist fracture: Currently being treated with Prolia Discussed importance of assessing her vitamin D status as well as PTH level because of her renal dysfunction Needs to maintain adequate calcium intake also while taking Prolia Labs will be rechecked today for metabolic panel   GOITER with normal thyroid levels Need to recheck thyroid levels since she is on amiodarone  Hypertension: Blood pressure is normal today  She will schedule follow-up with PCP   Total visit time for evaluation and management of multiple problems and counseling =25 minutes  Elayne Snare 10/28/2018, 10:38 AM    Note: This office note was prepared with Dragon voice recognition system technology. Any transcriptional errors that result from this process are unintentional.  ADDENDUM: PTH is appropriately mildly increased and vitamin D level is low We will need to check how much vitamin D level patient is taking Creatinine clearance is improved, she can continue same dose of Januvia

## 2018-10-29 LAB — PARATHYROID HORMONE, INTACT (NO CA): PTH: 73 pg/mL — ABNORMAL HIGH (ref 15–65)

## 2018-11-11 ENCOUNTER — Ambulatory Visit
Admission: RE | Admit: 2018-11-11 | Discharge: 2018-11-11 | Disposition: A | Discharge status: Home / Self Care | Payer: Medicare Other | Source: Ambulatory Visit | Attending: Hematology and Oncology | Admitting: Hematology and Oncology

## 2018-11-11 ENCOUNTER — Other Ambulatory Visit: Payer: Self-pay

## 2018-11-11 DIAGNOSIS — Z853 Personal history of malignant neoplasm of breast: Secondary | ICD-10-CM

## 2018-11-11 DIAGNOSIS — R928 Other abnormal and inconclusive findings on diagnostic imaging of breast: Secondary | ICD-10-CM | POA: Diagnosis not present

## 2018-11-13 ENCOUNTER — Other Ambulatory Visit: Payer: Self-pay | Admitting: Endocrinology

## 2018-11-20 ENCOUNTER — Other Ambulatory Visit: Payer: Self-pay | Admitting: Family Medicine

## 2018-11-30 ENCOUNTER — Other Ambulatory Visit: Payer: Self-pay

## 2018-11-30 NOTE — Patient Outreach (Signed)
[facility]) Care Management  11/30/2018  Whitney [name] 1927/12/18 [id number]   Medication Adherence call to Whitney [name] Hippa Identifiers Verify spoke with patient she is past due on Lisinopril 20 mg patient explain she take 1 tablet daily and has pick up from [facility] last week. Whitney [name] is showing past due under [place].   [place] Management Direct Dial [phone]  Fax [phone] [email or web address]

## 2018-12-16 ENCOUNTER — Other Ambulatory Visit: Payer: Self-pay

## 2018-12-16 NOTE — Patient Outreach (Signed)
[facility]) Care Management  12/16/2018  Whitney [name] February 09, 1928 [id number]   Medication Adherence call to Whitney [name] Hippa Identifiers Verify spoke with patient she is past due on Januvia 100 mg and Lisinopril 20 mg patient explain she is taking both medications patient has pick up Lisinopril 2 weeks ago and on Januvia she is only taking 1/2 tablet daily per doctors instruction patient has medication at this time, Whitney [name] is showing past due under [place].   [place] Management Direct Dial [phone]  Fax [phone] [email or web address]

## 2018-12-18 ENCOUNTER — Other Ambulatory Visit: Payer: Self-pay | Admitting: Family Medicine

## 2018-12-21 ENCOUNTER — Other Ambulatory Visit: Payer: Self-pay | Admitting: Endocrinology

## 2018-12-27 ENCOUNTER — Other Ambulatory Visit: Payer: Self-pay | Admitting: Endocrinology

## 2019-01-01 ENCOUNTER — Other Ambulatory Visit: Payer: Self-pay | Admitting: Family Medicine

## 2019-01-01 DIAGNOSIS — I10 Essential (primary) hypertension: Secondary | ICD-10-CM

## 2019-01-02 ENCOUNTER — Other Ambulatory Visit: Payer: Self-pay

## 2019-01-02 MED ORDER — AMIODARONE HCL 200 MG PO TABS: 200.0000 mg | ORAL_TABLET | Freq: Every day | ORAL | 3 refills | Status: DC

## 2019-01-16 ENCOUNTER — Other Ambulatory Visit: Payer: Self-pay | Admitting: Family Medicine

## 2019-01-16 ENCOUNTER — Other Ambulatory Visit: Payer: Self-pay

## 2019-01-16 NOTE — Patient Outreach (Signed)
[facility]) Care Management  01/16/2019  Whitney [name] 11-08-1927 [id number]  Medication Adherence call to Whitney [name] Hippa Identifiers Verify spoke with patient she is past due on Januvia 100 mg and Lisinopril 20 mg,patiente explain she is taking both medication once daily on Lisinopril she has medication until she sees her doctor again on Januvia she ask to call [facility] to see if they have it ready,pharmacy will have it ready for patient to pick up.Whitney [name] is showing past due under [name].   [place] Management Direct Dial [phone]  Fax [phone] [email or web address]

## 2019-03-02 ENCOUNTER — Ambulatory Visit: Payer: Medicare Other | Admitting: Endocrinology

## 2019-03-03 ENCOUNTER — Ambulatory Visit: Payer: Medicare Other | Admitting: Endocrinology

## 2019-03-07 ENCOUNTER — Encounter: Payer: Self-pay | Admitting: Endocrinology

## 2019-03-07 ENCOUNTER — Ambulatory Visit (INDEPENDENT_AMBULATORY_CARE_PROVIDER_SITE_OTHER): Payer: Medicare Other | Admitting: Endocrinology

## 2019-03-07 ENCOUNTER — Other Ambulatory Visit: Payer: Self-pay

## 2019-03-07 VITALS — BP 130/78 | HR 63 | Ht 66.5 in

## 2019-03-07 DIAGNOSIS — E042 Nontoxic multinodular goiter: Secondary | ICD-10-CM | POA: Diagnosis not present

## 2019-03-07 DIAGNOSIS — E119 Type 2 diabetes mellitus without complications: Secondary | ICD-10-CM

## 2019-03-07 DIAGNOSIS — E559 Vitamin D deficiency, unspecified: Secondary | ICD-10-CM

## 2019-03-07 LAB — POCT GLYCOSYLATED HEMOGLOBIN (HGB A1C): Hemoglobin A1C: 5.7 % — AB (ref 4.0–5.6)

## 2019-03-07 NOTE — Progress Notes (Signed)
Patient ID: Whitney [name], female   DOB: 30-Oct-1927, 85 y.o.   MRN: [id number]   Reason for Appointment: Diabetes and other problems  History of Present Illness     Type 2 DIABETES MELITUS, date of diagnosis:  1983     Previous history: She had been taking metformin and Actos for several years.  She usually has had excellent control with upper normal A1c  Recent history:   Oral hypoglycemic drugs: Januvia 100 mg, half tablet daily    She was previously on  Metformin and Actos; because of edema she was told to stop Actos Her metformin was stopped because of renal dysfunction and Amaryl because of hypoglycemia  Her A1c is again excellent and unchanged at 5.7   Current blood sugar patterns, management details:  She only checks her blood sugars in the evenings a couple of hours after dinner and these are mildly increased  However most of her sugars are below 200  She thinks her diet is about the same as before  However her granddaughter was not present in the office visit today and she does not give a good history  As before has difficulty ambulating and not doing much exercise  Her weight could not be measured today in the office Has been regular with taking her Januvia, this has been reduced because of previous renal dysfunction       Monitors blood glucose:   less than once a day .    Glucometer: One Touch.           Blood sugars reviewed from monitor download  Evening blood sugars range 154-201 with AVERAGE 172  PREVIOUS blood sugar range 101-175 with AVERAGE 139   Physical activity: exercise: Minimal         Wt Readings from Last 3 Encounters:  10/28/18 167 lb 3.2 oz (75.8 kg)  09/23/18 169 lb (76.7 kg)  08/11/18 169 lb (76.7 kg)    LABS:  Lab Results  Component Value Date   HGBA1C 5.7 (A) 03/07/2019   HGBA1C 5.7 (A) 10/28/2018   HGBA1C 5.9 (A) 06/27/2018   Lab Results  Component Value Date   MICROALBUR 6.1 04/18/2018   LDLCALC 59  04/18/2018   CREATININE 1.29 (H) 10/28/2018     OTHER problems  are discussed in review of systems      Allergies as of 03/07/2019      Reactions   Tape Other (See Comments)   Skin is somewhat sensitive      Medication List       Accurate as of March 07, 2019 11:59 PM. If you have any questions, ask your nurse or doctor.        amiodarone 200 MG tablet Commonly known as: PACERONE Take 1 tablet (200 mg total) by mouth daily.   atorvastatin 20 MG tablet Commonly known as: LIPITOR Take 1 tablet (20 mg total) by mouth daily.   Calcium 1200 1200-1000 MG-UNIT Chew Chew 1,200 mg by mouth daily.   cholecalciferol 25 MCG (1000 UT) tablet Commonly known as: VITAMIN D3 Take 1 tablet (1,000 Units total) by mouth daily.   denosumab 60 MG/ML Sosy injection Commonly known as: PROLIA Inject 60 mg into the skin every 6 (six) months.   diclofenac sodium 1 % Gel Commonly known as: VOLTAREN APPLY 2 GRAMS TO AFFECTED AREA 4 TIMES A DAY   doxazosin 4 MG tablet Commonly known as: CARDURA TAKE 1 TABLET BY MOUTH EVERY DAY   Eliquis 5 MG  Tabs tablet Generic drug: apixaban TAKE 1 TABLET TWICE A DAY   Ex-Lax 15 MG Tabs Generic drug: Sennosides Take 1 tablet by mouth daily as needed (CONSTIPATION).   ferrous sulfate 325 (65 FE) MG tablet Take 325 mg by mouth daily with breakfast.   furosemide 40 MG tablet Commonly known as: LASIX TAKE 2 TABLETS BY MOUTH TWICE A DAY   HYDROcodone-acetaminophen 5-325 MG tablet Commonly known as: NORCO/VICODIN Take 1 tablet by mouth every 4 (four) hours as needed.   Januvia 100 MG tablet Generic drug: sitaGLIPtin TAKE 1 TABLET BY MOUTH EVERY DAY   Klor-Con M20 20 MEQ tablet Generic drug: potassium chloride SA TAKE 1 TABLET BY MOUTH EVERY DAY   lisinopril 20 MG tablet Commonly known as: ZESTRIL TAKE 1 TABLET BY MOUTH EVERY DAY   meclizine 12.5 MG tablet Commonly known as: ANTIVERT TAKE 1 TABLET BY MOUTH DAILY AS NEEDED FOR  DIZZINESS   metolazone 5 MG tablet Commonly known as: ZAROXOLYN Take 1 tablet (5 mg total) by mouth daily.   metoprolol succinate 100 MG 24 hr tablet Commonly known as: TOPROL-XL TAKE 1 TABLET BY MOUTH TWICE A DAY WITH OR IMMEDIATELY FOLLOWING A MEAL   OneTouch Verio test strip Generic drug: glucose blood CHECK BLOOD SUGAR EVERY DAY AS DIRECTED   OneTouch Verio w/Device Kit Use to test blood sugars daily.   oxyCODONE 5 MG immediate release tablet Commonly known as: Roxicodone Take 1 tablet (5 mg total) by mouth every 6 (six) hours as needed for severe pain (stop tramadol).   potassium chloride 10 MEQ tablet Commonly known as: KLOR-CON Take 1 tablet (10 mEq total) by mouth daily.   REFRESH OPTIVE ADVANCED OP Place 1 drop into both eyes 3 (three) times daily.   tamoxifen 20 MG tablet Commonly known as: NOLVADEX Take 1 tablet (20 mg total) by mouth daily.   traMADol 50 MG tablet Commonly known as: ULTRAM Take 1 tablet (50 mg total) by mouth every 8 (eight) hours as needed.       Allergies:  Allergies  Allergen Reactions  . Tape Other (See Comments)    Skin is somewhat sensitive    Past Medical History:  Diagnosis Date  . Anemia   . Atrial fibrillation ([place])   . Breast cancer ([place]) 10/2013   right upper outer  . Cancer ([place])    right breast  . Complication of anesthesia    slow to wake up  . Diabetes mellitus without complication ([place])   . Dysrhythmia 10/15   AF  . Former smoker   . Full dentures   . Hyperlipidemia   . Hypertension   . Multinodular goiter   . Osteoporosis   . Radiation    Right Breast  . Thyroid disease    hypothyroidism  . Wears glasses     Past Surgical History:  Procedure Laterality Date  . ABDOMINAL HYSTERECTOMY    . AXILLARY LYMPH NODE DISSECTION Right 01/09/2014   Procedure: RIGHT AXILLARY LYMPH NODE DISECTION;  Surgeon: [name], MD;  Location: [facility];  Service: General;  Laterality: Right;  .  BREAST LUMPECTOMY WITH RADIOACTIVE SEED LOCALIZATION Right 01/09/2014   Procedure: RIGHT BREAST SEED LOCALIZED LUMPECTOMY ;  Surgeon: [name], MD;  Location: [facility];  Service: General;  Laterality: Right;  . EYE SURGERY  12/22/2009   cataracts  . INTRAMEDULLARY (IM) NAIL INTERTROCHANTERIC Left 02/24/2014   Procedure: IM ROD LEFT HIP FX;  Surgeon: Alta Corning, MD;  Location: [facility];  Service: Orthopedics;  Laterality: Left;  . left distal radius fracture     2020  . PORT-A-CATH REMOVAL Right 01/09/2014   Procedure: REMOVAL PORT-A-CATH;  Surgeon: [name], MD;  Location: [facility];  Service: General;  Laterality: Right;  . PORTACATH PLACEMENT N/A 11/15/2013   Procedure: INSERTION PORT-A-CATH WITH ULTRA SOUND AND FLOROSCOPY;  Surgeon: [name], MD;  Location: [facility];  Service: General;  Laterality: N/A;  . TOTAL KNEE ARTHROPLASTY  2002   left    History reviewed. No pertinent family history.  Social History:  reports that she quit smoking about 60 years ago. She has a 5.00 pack-year smoking history. She has never used smokeless tobacco. She reports that she does not drink alcohol or use drugs.  Review of Systems:   Hypertension:  She is taking lisinopril 20 mg daily, 2 mg doxazosin, 100 mg of metoprolol  Followed by PCP   EDEMA this is still present to a variable extent despite using elastic stockings, more on the left side  She is taking Lasix 80 mg daily and also metolazone for edema, prescribed by PCP   Renal function: Creatinine has been variable, last reading was improved  Lab Results  Component Value Date   CREATININE 1.29 (H) 10/28/2018   CREATININE 1.75 (H) 08/11/2018   CREATININE 1.49 (H) 07/05/2018     Lipids: Has been on long-term Lipitor with good control     Lab Results  Component Value Date   CHOL 141 04/18/2018   HDL 68 04/18/2018   LDLCALC 59 04/18/2018   TRIG 64 04/18/2018   CHOLHDL 2.1 04/18/2018     She has had vitamin D deficiency, Treated with 2000 units vitamin D 3 Needs follow-up level  OSTEOPOROSIS: Her bone density checked by her PCP indicated osteoporosis and she has had a wrist fracture Has been started on Prolia However she does also have renal dysfunction not followed by nephrologist No recent PTH level available  GOITER: She has had a multinodular goiter for several years which is quite small on exam  Levothyroxine supplements were stopped in 5/16 and she has continued to be euthyroid  Her atrial arrhythmias have been treated with 200 mg of amiodarone long-term   Lab Results  Component Value Date   TSH 0.80 10/28/2018   TSH 0.39 06/27/2018   TSH 0.50 04/18/2018   FREET4 1.58 10/28/2018   FREET4 1.60 06/27/2018   FREET4 1.47 10/29/2017    Last foot exam in 8/19   ADRENAL mass: Copy of the previous note as follows:  She had a CT scan done previously and was found to have a left adrenal mass  She was evaluated previously by a PET scan after  diagnosis of breast cancer in 2015 She was found to have a 3.7 cm hypermetabolic left adrenal mass On a previous CT scan in 2011 she also had a 3.8 cm adrenal mass Cortisol level is normal in 2015 and 2016 No history of hypokalemia or severe hypertension/palpitations, headaches, flushing or significant fluctuations in blood pressure Had evaluation with metanephrines and overnight dexamethasone test indicating normal adrenal function  History of anemia:  Lab Results  Component Value Date   HGB 9.8 (L) 07/05/2018    Examination:   BP 130/78 (BP Location: Left Arm, Patient Position: Sitting, Cuff Size: Normal)   Pulse 63   Ht 5' 6.5" (1.689 m)   SpO2 97%   BMI 26.58 kg/m   Body mass index is 26.58  kg/m.   She has moderate swelling of the left lower leg and mild on the right, wearing elastic stockings  ASSESSMENT/ PLAN:  Diabetes type 2   See history of present illness for  discussion of current  diabetes management, blood sugar patterns and problems identified  Her A1c is the same at 5.7 A1c may be affected by renal dysfunction and anemia   She appears to have fairly good blood sugars at home considering her age and averaging below 170 at night after dinner As before she checks her sugars only after evening meal and does not rotate testing times  She is on Januvia 50 mg She will continue the same as this appears to be effective Follow-up in 4 months again  Renal dysfunction: Labs will be rechecked today   GOITER with normal thyroid levels Need to recheck thyroid levels regularly since she is on amiodarone  Hypertension: Blood pressure is appearing consistently controlled  She will schedule follow-up with PCP for regular follow-up    Elayne Snare 03/08/2019, 8:38 AM    Note: This office note was prepared with Dragon voice recognition system technology. Any transcriptional errors that result from this process are unintentional.  ADDENDUM: PTH is appropriately mildly increased and vitamin D level is low We will need to check how much vitamin D level patient is taking Creatinine clearance is improved, she can continue same dose of Januvia

## 2019-03-13 ENCOUNTER — Telehealth: Payer: Self-pay

## 2019-03-13 NOTE — Telephone Encounter (Signed)
Called patient x3 to schedule lab appt no VM ser up

## 2019-03-13 NOTE — Telephone Encounter (Signed)
-----   Message from Elayne Snare, MD sent at 03/08/2019  8:58 AM EST ----- Regarding: Missing labs She came yesterday but did not stop at the lab.  Please reschedule for lab work unless she is planning to see her PCP

## 2019-03-28 ENCOUNTER — Other Ambulatory Visit: Payer: Self-pay | Admitting: Hematology and Oncology

## 2019-03-28 DIAGNOSIS — C50411 Malignant neoplasm of upper-outer quadrant of right female breast: Secondary | ICD-10-CM

## 2019-04-06 ENCOUNTER — Other Ambulatory Visit: Payer: Self-pay

## 2019-04-06 ENCOUNTER — Ambulatory Visit (INDEPENDENT_AMBULATORY_CARE_PROVIDER_SITE_OTHER): Payer: Medicare Other

## 2019-04-06 DIAGNOSIS — M810 Age-related osteoporosis without current pathological fracture: Secondary | ICD-10-CM | POA: Diagnosis not present

## 2019-04-06 MED ORDER — DENOSUMAB 60 MG/ML ~~LOC~~ SOSY: 60.0000 mg | PREFILLED_SYRINGE | Freq: Once | SUBCUTANEOUS | 0 refills | Status: DC

## 2019-04-06 MED ORDER — DENOSUMAB 60 MG/ML ~~LOC~~ SOSY
60.0000 mg | PREFILLED_SYRINGE | SUBCUTANEOUS | Status: AC
  Administered 2019-04-06 – 2020-04-19 (×3): 60 mg via SUBCUTANEOUS

## 2019-04-26 ENCOUNTER — Telehealth: Payer: Self-pay | Admitting: Family Medicine

## 2019-04-26 NOTE — Telephone Encounter (Signed)
Tie went by patients house yesterday and did a mini- cognitive test and patient wasn't able to recall any of the 3 questions.  CB# [phone]

## 2019-04-28 ENCOUNTER — Ambulatory Visit: Payer: Medicare Other

## 2019-04-28 NOTE — Telephone Encounter (Signed)
Be glad to see the patient to eval for memory loss.

## 2019-05-02 NOTE — Telephone Encounter (Signed)
[email or web address]

## 2019-05-05 NOTE — Telephone Encounter (Signed)
Cierra called and wanted to know if pt should get the COVID vaccine - per Dr. Dennard Schaumann yes it is highly recommended that she get the vaccine

## 2019-05-10 NOTE — Telephone Encounter (Signed)
Called and spoke to pt's daughter and she is aware of recommendations. Also asked about pt's memory issues and she has not noticed any but she will discuss with Cierra and have her call to make an apt if necessary.

## 2019-05-11 ENCOUNTER — Other Ambulatory Visit: Payer: Self-pay | Admitting: Endocrinology

## 2019-05-13 DIAGNOSIS — Z23 Encounter for immunization: Secondary | ICD-10-CM | POA: Diagnosis not present

## 2019-05-25 NOTE — Progress Notes (Signed)
Patient Care Team: [name], MD as PCP - General (Family Medicine) Nicholas Lose, MD as Consulting Physician (Hematology and Oncology) [name], MD as Consulting Physician (General Surgery)  DIAGNOSIS:    ICD-10-CM   1. Malignant neoplasm of upper-outer quadrant of right breast in female, estrogen receptor positive ([place])  C50.411    Z17.0     SUMMARY OF ONCOLOGIC HISTORY: Oncology History  Breast cancer of upper-outer quadrant of right female breast ([place])  10/20/2013 Mammogram   Ultrasound and mammogram showed 2.1 x 2.1 x 1.9 cm right breast mass   10/20/2013 Initial Diagnosis   Breast cancer of upper-outer quadrant of right female breast. Invasive ductal cancer with lymphovascular invasion one lymph node biopsy that was positive for cancer grade 1; Her 2 Neg Ratio 0.96, ER100% PR 8% positive Ki 67: 11%;   10/31/2013 Breast MRI   Right breast upper outer quadrant: 3.1 x 1.9 x 2.2 cm: Right axillary lymph node 1.1 x 0.9 x 0.6 cm   11/16/2013 - 12/01/2013 Chemotherapy   Neoadjuvant dose dense Doxorubicin and Cyclophosphamide given on day 1 of a 14 day cycle with Neulasta given on day 2 for granulocyte support   01/09/2014 Surgery   Right breast lumpectomy: Invasive ductal carcinoma, grade 2, 1.8 cm with DCIS intermediate grade, lymphovascular invasion diffusely, 5 of 13 lymph nodes positive with extracapsular extension, ER positive, PR 80%, HER-2 negative ratio 0.96, Ki-67 11%   02/25/2014 - 02/28/2014 Hospital Admission   Left hip fracture as a result of a fall at home   05/15/2014 - 06/29/2014 Radiation Therapy   Adjuvant radiation therapy with Dr. Pablo Ledger   07/20/2014 -  Anti-estrogen oral therapy   Tamoxifen 20 mg daily     CHIEF COMPLIANT: Follow-up of left breast cancer on tamoxifen therapy  INTERVAL HISTORY: Whitney [name] is a 86 y.o. with above-mentioned history of left breast cancer currently on antiestrogen therapy with tamoxifen. Mammogram on 11/11/18  showed no evidence of malignancy bilaterally. She presents to the clinic today for annual follow-up.  She tells me that she had vertigo and had a fall and fractured her wrist.  Otherwise she is doing quite well.  She is in a wheelchair today.  She requires assistance with some of her ADLs.  However she tells me that she is otherwise healthy and does not seem to have any problems taking tamoxifen.  ALLERGIES:  is allergic to tape.  MEDICATIONS:  Current Outpatient Medications  Medication Sig Dispense Refill  . amiodarone (PACERONE) 200 MG tablet Take 1 tablet (200 mg total) by mouth daily. 90 tablet 3  . atorvastatin (LIPITOR) 20 MG tablet Take 1 tablet (20 mg total) by mouth daily. 90 tablet 3  . Blood Glucose Monitoring Suppl (ONETOUCH VERIO) w/Device KIT Use to test blood sugars daily. 1 kit 0  . Calcium Carbonate-Vit D-Min (CALCIUM 1200) 1200-1000 MG-UNIT CHEW Chew 1,200 mg by mouth daily. 30 tablet 3  . Carboxymeth-Glycerin-Polysorb (REFRESH OPTIVE ADVANCED OP) Place 1 drop into both eyes 3 (three) times daily.    . cholecalciferol (VITAMIN D3) 25 MCG (1000 UT) tablet Take 1 tablet (1,000 Units total) by mouth daily. 30 tablet 3  . diclofenac sodium (VOLTAREN) 1 % GEL APPLY 2 GRAMS TO AFFECTED AREA 4 TIMES A DAY 100 g 2  . doxazosin (CARDURA) 4 MG tablet TAKE 1 TABLET BY MOUTH EVERY DAY 90 tablet 2  . ELIQUIS 5 MG TABS tablet TAKE 1 TABLET TWICE A DAY 60 tablet 10  .  ferrous sulfate 325 (65 FE) MG tablet Take 325 mg by mouth daily with breakfast.    . furosemide (LASIX) 40 MG tablet TAKE 2 TABLETS BY MOUTH TWICE A DAY 360 tablet 2  . HYDROcodone-acetaminophen (NORCO/VICODIN) 5-325 MG tablet Take 1 tablet by mouth every 4 (four) hours as needed. 8 tablet 0  . JANUVIA 100 MG tablet TAKE 1 TABLET BY MOUTH EVERY DAY 90 tablet 0  . KLOR-CON M20 20 MEQ tablet TAKE 1 TABLET BY MOUTH EVERY DAY 90 tablet 1  . lisinopril (ZESTRIL) 20 MG tablet TAKE 1 TABLET BY MOUTH EVERY DAY 90 tablet 3  . meclizine  (ANTIVERT) 12.5 MG tablet TAKE 1 TABLET BY MOUTH DAILY AS NEEDED FOR DIZZINESS 30 tablet 2  . metolazone (ZAROXOLYN) 5 MG tablet Take 1 tablet (5 mg total) by mouth daily. 10 tablet 0  . metoprolol succinate (TOPROL-XL) 100 MG 24 hr tablet TAKE 1 TABLET BY MOUTH TWICE A DAY WITH OR IMMEDIATELY FOLLOWING A MEAL 180 tablet 1  . ONETOUCH VERIO test strip CHECK BLOOD SUGAR EVERY DAY AS DIRECTED 100 strip 1  . oxyCODONE (ROXICODONE) 5 MG immediate release tablet Take 1 tablet (5 mg total) by mouth every 6 (six) hours as needed for severe pain (stop tramadol). 30 tablet 0  . potassium chloride (K-DUR) 10 MEQ tablet Take 1 tablet (10 mEq total) by mouth daily. 90 tablet 3  . Sennosides (EX-LAX) 15 MG TABS Take 1 tablet by mouth daily as needed (CONSTIPATION).    Marland Kitchen tamoxifen (NOLVADEX) 20 MG tablet TAKE 1 TABLET BY MOUTH EVERY DAY 90 tablet 3  . traMADol (ULTRAM) 50 MG tablet Take 1 tablet (50 mg total) by mouth every 8 (eight) hours as needed. 60 tablet 0   Current Facility-Administered Medications  Medication Dose Route Frequency Provider Last Rate Last Admin  . denosumab (PROLIA) injection 60 mg  60 mg Subcutaneous Q6 months [name], MD   60 mg at 04/06/19 1422    PHYSICAL EXAMINATION: ECOG PERFORMANCE STATUS: 2 - Symptomatic, <50% confined to bed  Vitals:   05/26/19 0817  BP: (!) 159/77  Pulse: (!) 57  Resp: 17  Temp: 98.2 F (36.8 C)  SpO2: 98%   Filed Weights   05/26/19 0817  Weight: 168 lb 3.2 oz (76.3 kg)    BREAST: No palpable masses or nodules in either right or left breasts. No palpable axillary supraclavicular or infraclavicular adenopathy no breast tenderness or nipple discharge. (exam performed in the presence of a chaperone)  LABORATORY DATA:  I have reviewed the data as listed CMP Latest Ref Rng & Units 10/28/2018 08/11/2018 07/05/2018  Glucose 70 - 99 mg/dL 146(H) 189(H) 152(H)  BUN 6 - 23 mg/dL 18 31(H) 23  Creatinine 0.40 - 1.20 mg/dL 1.29(H) 1.75(H) 1.49(H)   Sodium 135 - 145 mEq/L 142 140 135  Potassium 3.5 - 5.1 mEq/L 3.6 3.9 4.2  Chloride 96 - 112 mEq/L 105 102 103  CO2 19 - 32 mEq/L 30 28 25   Calcium 8.4 - 10.5 mg/dL 8.6 8.8 8.5(L)  Total Protein 6.0 - 8.3 g/dL 6.0 - -  Total Bilirubin 0.2 - 1.2 mg/dL 0.5 - -  Alkaline Phos 39 - 117 U/L 43 - -  AST 0 - 37 U/L 22 - -  ALT 0 - 35 U/L 16 - -    Lab Results  Component Value Date   WBC 5.0 07/05/2018   HGB 9.8 (L) 07/05/2018   HCT 30.8 (L) 07/05/2018  MCV 95.4 07/05/2018   PLT 177 07/05/2018   NEUTROABS 2,968 05/02/2018    ASSESSMENT & PLAN:  Breast cancer of upper-outer quadrant of right female breast ([place]) T1 CN2A M0 stage IIIa invasive ductal carcinoma right breast status post lumpectomy 5/13 lymph nodes positive with lymphovascular invasion and extracapsular extension. ER positive PR positive HER-2 negative Ki-67 11%. Patient was hospitalized for left hip fracture 02/25/2014, completed adjuvant radiation therapy 06/29/14, Started Tamoxifen 07/20/14 Hospitalization 12/30/2015: Escherichia colibacteremia.  Tamoxifen Toxicities:no toxicities to tamoxifen. Denies any hot flashes or myalgias. Plan is to treat her for 10 years  Breast Cancer Surveillance: 1. Breast exam3/02/2020: Benign.  Slight lymphedema of the right breast 2. Mammogram  11/11/2018: Benign breast density category B   Mild chronic kidney disease: This is being monitored by her primary care physician.  Return to clinic in 1 year for follow-up    No orders of the defined types were placed in this encounter.  The patient has a good understanding of the overall plan. she agrees with it. she will call with any problems that may develop before the next visit here.  Total time spent: 20 mins including face to face time and time spent for planning, charting and coordination of care  Nicholas Lose, MD 05/26/2019  I, Cloyde Reams Dorshimer, am acting as scribe for Dr. Nicholas Lose.  I have reviewed the above  documentation for accuracy and completeness, and I agree with the above.

## 2019-05-26 ENCOUNTER — Inpatient Hospital Stay: Payer: Medicare Other | Attending: Hematology and Oncology | Admitting: Hematology and Oncology

## 2019-05-26 ENCOUNTER — Other Ambulatory Visit: Payer: Self-pay

## 2019-05-26 DIAGNOSIS — Z7984 Long term (current) use of oral hypoglycemic drugs: Secondary | ICD-10-CM | POA: Diagnosis not present

## 2019-05-26 DIAGNOSIS — Z79810 Long term (current) use of selective estrogen receptor modulators (SERMs): Secondary | ICD-10-CM | POA: Diagnosis not present

## 2019-05-26 DIAGNOSIS — Z791 Long term (current) use of non-steroidal anti-inflammatories (NSAID): Secondary | ICD-10-CM | POA: Insufficient documentation

## 2019-05-26 DIAGNOSIS — C50411 Malignant neoplasm of upper-outer quadrant of right female breast: Secondary | ICD-10-CM | POA: Diagnosis not present

## 2019-05-26 DIAGNOSIS — Z7901 Long term (current) use of anticoagulants: Secondary | ICD-10-CM | POA: Diagnosis not present

## 2019-05-26 DIAGNOSIS — Z9221 Personal history of antineoplastic chemotherapy: Secondary | ICD-10-CM | POA: Insufficient documentation

## 2019-05-26 DIAGNOSIS — N182 Chronic kidney disease, stage 2 (mild): Secondary | ICD-10-CM | POA: Diagnosis not present

## 2019-05-26 DIAGNOSIS — Z923 Personal history of irradiation: Secondary | ICD-10-CM | POA: Insufficient documentation

## 2019-05-26 DIAGNOSIS — Z170 Estrogen receptor positive status [ER+]: Secondary | ICD-10-CM

## 2019-05-26 DIAGNOSIS — Z79899 Other long term (current) drug therapy: Secondary | ICD-10-CM | POA: Insufficient documentation

## 2019-05-26 MED ORDER — TAMOXIFEN CITRATE 20 MG PO TABS: 20.0000 mg | ORAL_TABLET | Freq: Every day | ORAL | 3 refills | Status: DC

## 2019-05-26 NOTE — Assessment & Plan Note (Signed)
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 ER positive PR positive HER-2 negative Ki-67 11%. Patient was hospitalized for left hip fracture 02/25/2014, completed adjuvant radiation therapy 06/29/14, Started Tamoxifen 07/20/14 Hospitalization 12/30/2015: Escherichia colibacteremia.  Tamoxifen Toxicities:no toxicities to tamoxifen. Denies any hot flashes or myalgias. Plan is to treat her for 10 years  Breast Cancer Surveillance: 1. Breast exam3/02/2020: Benign. 2. Mammogram  11/11/2018: Benign breast density category B  Right arm lymphedema: Currently wearing the sleeve Mild chronic kidney disease: This is being monitored by her primary care physician.  Return to clinic in 1 year for follow-up

## 2019-05-28 ENCOUNTER — Other Ambulatory Visit: Payer: Self-pay | Admitting: Family Medicine

## 2019-06-06 ENCOUNTER — Telehealth: Payer: Self-pay

## 2019-06-06 NOTE — Telephone Encounter (Signed)
I called the patient this morning to reschedule her 07/07/19 appointment that I had to cancel due to Dr. Dwyane Dee being out of the office that day-phone rang several times no one ever picked up and no VM so I could not leave a message

## 2019-06-15 ENCOUNTER — Other Ambulatory Visit: Payer: Self-pay | Admitting: Endocrinology

## 2019-06-22 ENCOUNTER — Other Ambulatory Visit: Payer: Self-pay | Admitting: Endocrinology

## 2019-07-07 ENCOUNTER — Ambulatory Visit: Payer: Medicare Other | Admitting: Endocrinology

## 2019-07-19 ENCOUNTER — Encounter: Payer: Self-pay | Admitting: Endocrinology

## 2019-07-19 ENCOUNTER — Ambulatory Visit: Payer: Medicare Other | Admitting: Endocrinology

## 2019-07-19 ENCOUNTER — Other Ambulatory Visit (INDEPENDENT_AMBULATORY_CARE_PROVIDER_SITE_OTHER): Payer: Medicare Other

## 2019-07-19 ENCOUNTER — Other Ambulatory Visit: Payer: Self-pay

## 2019-07-19 VITALS — BP 140/62 | HR 61 | Ht 66.5 in | Wt 167.2 lb

## 2019-07-19 DIAGNOSIS — E7800 Pure hypercholesterolemia, unspecified: Secondary | ICD-10-CM

## 2019-07-19 DIAGNOSIS — E559 Vitamin D deficiency, unspecified: Secondary | ICD-10-CM

## 2019-07-19 DIAGNOSIS — D638 Anemia in other chronic diseases classified elsewhere: Secondary | ICD-10-CM

## 2019-07-19 DIAGNOSIS — E119 Type 2 diabetes mellitus without complications: Secondary | ICD-10-CM

## 2019-07-19 DIAGNOSIS — E042 Nontoxic multinodular goiter: Secondary | ICD-10-CM | POA: Diagnosis not present

## 2019-07-19 LAB — LIPID PANEL
Cholesterol: 110 mg/dL (ref 0–200)
HDL: 40.1 mg/dL (ref >39.00)
LDL Cholesterol: 53 mg/dL (ref 0–99)
NonHDL: 69.7
Total CHOL/HDL Ratio: 3
Triglycerides: 82 mg/dL (ref 0.0–149.0)
VLDL: 16.4 mg/dL (ref 0.0–40.0)

## 2019-07-19 LAB — COMPREHENSIVE METABOLIC PANEL
ALT: 28 U/L (ref 0–35)
AST: 33 U/L (ref 0–37)
Albumin: 3.3 g/dL — ABNORMAL LOW (ref 3.5–5.2)
Alkaline Phosphatase: 29 U/L — ABNORMAL LOW (ref 39–117)
BUN: 32 mg/dL — ABNORMAL HIGH (ref 6–23)
CO2: 28 mEq/L (ref 19–32)
Calcium: 8.8 mg/dL (ref 8.4–10.5)
Chloride: 107 mEq/L (ref 96–112)
Creatinine, Ser: 1.5 mg/dL — ABNORMAL HIGH (ref 0.40–1.20)
GFR: 39.53 mL/min — ABNORMAL LOW (ref >60.00)
Glucose, Bld: 187 mg/dL — ABNORMAL HIGH (ref 70–99)
Potassium: 3.6 mEq/L (ref 3.5–5.1)
Sodium: 141 mEq/L (ref 135–145)
Total Bilirubin: 0.5 mg/dL (ref 0.2–1.2)
Total Protein: 5.9 g/dL — ABNORMAL LOW (ref 6.0–8.3)

## 2019-07-19 LAB — CBC WITH DIFFERENTIAL/PLATELET
Basophils Absolute: 0 10*3/uL (ref 0.0–0.1)
Basophils Relative: 0.4 % (ref 0.0–3.0)
Eosinophils Absolute: 0 10*3/uL (ref 0.0–0.7)
Eosinophils Relative: 0.8 % (ref 0.0–5.0)
HCT: 27.6 % — ABNORMAL LOW (ref 36.0–46.0)
Hemoglobin: 9.4 g/dL — ABNORMAL LOW (ref 12.0–15.0)
Lymphocytes Relative: 13.1 % (ref 12.0–46.0)
Lymphs Abs: 0.6 10*3/uL — ABNORMAL LOW (ref 0.7–4.0)
MCHC: 33.9 g/dL (ref 30.0–36.0)
MCV: 98.1 fl (ref 78.0–100.0)
Monocytes Absolute: 0.3 10*3/uL (ref 0.1–1.0)
Monocytes Relative: 8.1 % (ref 3.0–12.0)
Neutro Abs: 3.3 10*3/uL (ref 1.4–7.7)
Neutrophils Relative %: 77.6 % — ABNORMAL HIGH (ref 43.0–77.0)
Platelets: 162 10*3/uL (ref 150.0–400.0)
RBC: 2.81 Mil/uL — ABNORMAL LOW (ref 3.87–5.11)
RDW: 15.2 % (ref 11.5–15.5)
WBC: 4.2 10*3/uL (ref 4.0–10.5)

## 2019-07-19 LAB — POCT GLYCOSYLATED HEMOGLOBIN (HGB A1C): Hemoglobin A1C: 6.1 % — AB (ref 4.0–5.6)

## 2019-07-19 LAB — TSH: TSH: 0.74 u[IU]/mL (ref 0.35–4.50)

## 2019-07-19 LAB — IBC PANEL
Iron: 39 ug/dL — ABNORMAL LOW (ref 42–145)
Saturation Ratios: 12.7 % — ABNORMAL LOW (ref 20.0–50.0)
Transferrin: 219 mg/dL (ref 212.0–360.0)

## 2019-07-19 LAB — VITAMIN D 25 HYDROXY (VIT D DEFICIENCY, FRACTURES): VITD: 72.43 ng/mL (ref 30.00–100.00)

## 2019-07-19 NOTE — Progress Notes (Signed)
Patient ID: Whitney [name], female   DOB: [birth year], 86 y.o.   MRN: [id number]   Reason for Appointment: Diabetes and other problems  History of Present Illness     Type 2 DIABETES MELITUS, date of diagnosis:  1983     Previous history: She had been taking metformin and Actos for several years.  She usually has had excellent control with upper normal A1c   She was previously on  Metformin and Actos; because of edema she was told to stop Actos Her metformin was stopped because of renal dysfunction and Amaryl because of hypoglycemia  Recent history:   Oral hypoglycemic drugs: Januvia 100 mg, half tablet daily  Her A1c is again in the normal range at 6.1 compared to 5.7   Current blood sugar patterns, management details:  She has had occasional blood sugars over 300 and once over 400, she thinks this is from eating sweets  However in the last 2 weeks has not had significantly high readings  Again she checks her blood sugar readings only at various times in the evenings with the help of her granddaughter  On an average blood sugars are about the same as before  Her weight is about the same as last year  Usually trying to moderate portions of high-fat meals and avoid drinks with sugar  On reduced dose of Januvia with renal dysfunction but no recent creatinine available Unable to do much physical activity       Monitors blood glucose:   less than once a day .    Glucometer: One Touch.           Blood sugars reviewed from monitor download  Evening blood sugars range 118-214, average 160 Previously 154-201 with AVERAGE 172   Physical activity: exercise: Minimal         Wt Readings from Last 3 Encounters:  07/19/19 167 lb 3.2 oz (75.8 kg)  05/26/19 168 lb 3.2 oz (76.3 kg)  10/28/18 167 lb 3.2 oz (75.8 kg)    LABS:  Lab Results  Component Value Date   HGBA1C 6.1 (A) 07/19/2019   HGBA1C 5.7 (A) 03/07/2019   HGBA1C 5.7 (A) 10/28/2018   Lab Results    Component Value Date   MICROALBUR 6.1 04/18/2018   LDLCALC 59 04/18/2018   CREATININE 1.29 (H) 10/28/2018     OTHER problems  are discussed in review of systems     Allergies as of 07/19/2019      Reactions   Tape Other (See Comments)   Skin is somewhat sensitive      Medication List       Accurate as of Jul 19, 2019  9:19 AM. If you have any questions, ask your nurse or doctor.        amiodarone 200 MG tablet Commonly known as: PACERONE Take 1 tablet (200 mg total) by mouth daily.   atorvastatin 20 MG tablet Commonly known as: LIPITOR Take 1 tablet (20 mg total) by mouth daily.   Calcium 1200 1200-1000 MG-UNIT Chew Chew 1,200 mg by mouth daily.   cholecalciferol 25 MCG (1000 UNIT) tablet Commonly known as: VITAMIN D3 Take 1 tablet (1,000 Units total) by mouth daily.   doxazosin 4 MG tablet Commonly known as: CARDURA TAKE 1 TABLET BY MOUTH EVERY DAY   Eliquis 5 MG Tabs tablet Generic drug: apixaban TAKE 1 TABLET TWICE A DAY   Ex-Lax 15 MG Tabs Generic drug: Sennosides Take 1 tablet by mouth daily as needed (  CONSTIPATION).   ferrous sulfate 325 (65 FE) MG tablet Take 325 mg by mouth daily with breakfast.   furosemide 40 MG tablet Commonly known as: LASIX TAKE 2 TABLETS BY MOUTH TWICE A DAY   HYDROcodone-acetaminophen 5-325 MG tablet Commonly known as: NORCO/VICODIN Take 1 tablet by mouth every 4 (four) hours as needed.   Januvia 100 MG tablet Generic drug: sitaGLIPtin TAKE 1 TABLET BY MOUTH EVERY DAY What changed:   how much to take  additional instructions   Klor-Con M20 20 MEQ tablet Generic drug: potassium chloride SA TAKE 1 TABLET BY MOUTH EVERY DAY   lisinopril 20 MG tablet Commonly known as: ZESTRIL TAKE 1 TABLET BY MOUTH EVERY DAY   meclizine 12.5 MG tablet Commonly known as: ANTIVERT TAKE 1 TABLET BY MOUTH DAILY AS NEEDED FOR DIZZINESS   metolazone 5 MG tablet Commonly known as: ZAROXOLYN Take 1 tablet (5 mg total) by mouth  daily.   metoprolol succinate 100 MG 24 hr tablet Commonly known as: TOPROL-XL TAKE 1 TABLET BY MOUTH TWICE A DAY WITH OR IMMEDIATELY FOLLOWING A MEAL   OneTouch Verio test strip Generic drug: glucose blood CHECK BLOOD SUGAR EVERY DAY AS DIRECTED   OneTouch Verio w/Device Kit Use to test blood sugars daily.   oxyCODONE 5 MG immediate release tablet Commonly known as: Roxicodone Take 1 tablet (5 mg total) by mouth every 6 (six) hours as needed for severe pain (stop tramadol).   potassium chloride 10 MEQ tablet Commonly known as: KLOR-CON Take 1 tablet (10 mEq total) by mouth daily.   REFRESH OPTIVE ADVANCED OP Place 1 drop into both eyes 3 (three) times daily.   tamoxifen 20 MG tablet Commonly known as: NOLVADEX Take 1 tablet (20 mg total) by mouth daily.   traMADol 50 MG tablet Commonly known as: ULTRAM Take 1 tablet (50 mg total) by mouth every 8 (eight) hours as needed.       Allergies:  Allergies  Allergen Reactions  . Tape Other (See Comments)    Skin is somewhat sensitive    Past Medical History:  Diagnosis Date  . Anemia   . Atrial fibrillation ([place])   . Breast cancer ([place]) 10/2013   right upper outer  . Cancer ([place])    right breast  . Complication of anesthesia    slow to wake up  . Diabetes mellitus without complication ([place])   . Dysrhythmia 10/15   AF  . Former smoker   . Full dentures   . Hyperlipidemia   . Hypertension   . Multinodular goiter   . Osteoporosis   . Radiation    Right Breast  . Thyroid disease    hypothyroidism  . Wears glasses     Past Surgical History:  Procedure Laterality Date  . ABDOMINAL HYSTERECTOMY    . AXILLARY LYMPH NODE DISSECTION Right 01/09/2014   Procedure: RIGHT AXILLARY LYMPH NODE DISECTION;  Surgeon: [name], MD;  Location: [facility];  Service: General;  Laterality: Right;  . BREAST LUMPECTOMY WITH RADIOACTIVE SEED LOCALIZATION Right 01/09/2014   Procedure: RIGHT BREAST SEED  LOCALIZED LUMPECTOMY ;  Surgeon: [name], MD;  Location: [facility];  Service: General;  Laterality: Right;  . EYE SURGERY  12/22/2009   cataracts  . INTRAMEDULLARY (IM) NAIL INTERTROCHANTERIC Left 02/24/2014   Procedure: IM ROD LEFT HIP FX;  Surgeon: Alta Corning, MD;  Location: [facility];  Service: Orthopedics;  Laterality: Left;  . left distal radius fracture  2020  Charlann Lange REMOVAL Right 01/09/2014   Procedure: REMOVAL PORT-A-CATH;  Surgeon: [name], MD;  Location: [facility];  Service: General;  Laterality: Right;  . PORTACATH PLACEMENT N/A 11/15/2013   Procedure: INSERTION PORT-A-CATH WITH ULTRA SOUND AND FLOROSCOPY;  Surgeon: [name], MD;  Location: [facility];  Service: General;  Laterality: N/A;  . TOTAL KNEE ARTHROPLASTY  2002   left    History reviewed. No pertinent family history.  Social History:  reports that she quit smoking about 60 years ago. She has a 5.00 pack-year smoking history. She has never used smokeless tobacco. She reports that she does not drink alcohol or use drugs.  Review of Systems:   Hypertension:  She is taking lisinopril 20 mg daily, 2 mg doxazosin, 100 mg of metoprolol  Followed by PCP   EDEMA of the legs, chronic, using elastic stockings, more on the left side  She is taking Lasix 80 mg daily and also metolazone for edema, prescribed by PCP   Renal function: Creatinine has been variable, last reading was improved  Lab Results  Component Value Date   CREATININE 1.29 (H) 10/28/2018   CREATININE 1.75 (H) 08/11/2018   CREATININE 1.49 (H) 07/05/2018     Lipids: Has been on long-term Lipitor with good control, needs follow-up labs    Lab Results  Component Value Date   CHOL 141 04/18/2018   HDL 68 04/18/2018   LDLCALC 59 04/18/2018   TRIG 64 04/18/2018   CHOLHDL 2.1 04/18/2018    She has had vitamin D deficiency, Treated with 2000 units vitamin D 3 Needs follow-up  level  OSTEOPOROSIS: Her bone density checked by her PCP indicated osteoporosis and she has had a wrist fracture Has been started on Prolia PTH has been appropriate for renal function  GOITER: She has had a multinodular goiter for several years  Levothyroxine supplements were stopped in 5/16 and she has continued to be euthyroid  Her atrial arrhythmias have been treated with 200 mg of amiodarone long-term   Lab Results  Component Value Date   TSH 0.80 10/28/2018   TSH 0.39 06/27/2018   TSH 0.50 04/18/2018   FREET4 1.58 10/28/2018   FREET4 1.60 06/27/2018   FREET4 1.47 10/29/2017    Last foot exam in 8/19, patient does not want to take of her elastic stockings for the exam today   ADRENAL mass: Copy of the previous note as follows:  She had a CT scan done previously and was found to have a left adrenal mass  She was evaluated previously by a PET scan after  diagnosis of breast cancer in 2015 She was found to have a 3.7 cm hypermetabolic left adrenal mass On a previous CT scan in 2011 she also had a 3.8 cm adrenal mass Cortisol level is normal in 2015 and 2016 No history of hypokalemia or severe hypertension/palpitations, headaches, flushing or significant fluctuations in blood pressure Had evaluation with metanephrines and overnight dexamethasone test indicating normal adrenal function  History of anemia, has not followed up with PCP:  Lab Results  Component Value Date   HGB 9.8 (L) 07/05/2018    Examination:   BP 140/62 (BP Location: Left Arm, Patient Position: Sitting, Cuff Size: Normal)   Pulse 61   Ht 5' 6.5" (1.689 m)   Wt 167 lb 3.2 oz (75.8 kg)   SpO2 96%   BMI 26.58 kg/m   Body mass index is 26.58 kg/m.   Thyroid is enlarged about twice  normal on the right and 1-1/2 times in size on the left, nodular and firm No lymphadenopathy in the neck  Lower legs appear to have some edema, not examined because of elastic stockings  ASSESSMENT/ PLAN:  Diabetes  type 2   See history of present illness for  discussion of current diabetes management, blood sugar patterns and problems identified  Her A1c is still excellent at 6.1  Has consistent control with Januvia 50 mg  A1c may be affected by renal dysfunction and anemia  Blood sugars are being checked only after dinner as before Her blood sugars are variable depending on her diet Although in the last month she has had readings as high as 472 her average blood sugar is 177 in the last month  Blood sugars are high when she is getting into some sweets Does not check blood sugars at any other time, is dependent on her granddaughter to check her sugars  Follow-up in 4 months on the same dose  Renal dysfunction: Labs will be checked today   Multinodular GOITER with normal thyroid levels Her exam is about the same as before and no dominant nodule felt  Need to recheck thyroid levels regularly since she is on amiodarone  Hypertension: Blood pressure is consistently controlled on multiple medications  Anemia: Needs follow-up CBC and will draw today Also needs follow-up lipids  Again reminded her to schedule follow-up with PCP for regular follow-up    Whitney [name] 07/19/2019, 9:19 AM    Note: This office note was prepared with Dragon voice recognition system technology. Any transcriptional errors that result from this process are unintentional.  Addendum: Renal function slightly worse but in the usual range.  Forwarding CBC and iron results to PCP for follow-up

## 2019-07-19 NOTE — Progress Notes (Signed)
Please call her granddaughter, does have significant anemia with low iron level and needs to follow-up with Dr. Dennard Schaumann for evaluation.  Other labs are stable

## 2019-07-31 ENCOUNTER — Ambulatory Visit (INDEPENDENT_AMBULATORY_CARE_PROVIDER_SITE_OTHER): Payer: Medicare Other | Admitting: Family Medicine

## 2019-07-31 ENCOUNTER — Other Ambulatory Visit: Payer: Self-pay

## 2019-07-31 ENCOUNTER — Encounter: Payer: Self-pay | Admitting: Family Medicine

## 2019-07-31 VITALS — BP 160/74 | HR 64 | Temp 98.0°F | Resp 14 | Ht 66.5 in | Wt 168.0 lb

## 2019-07-31 DIAGNOSIS — D508 Other iron deficiency anemias: Secondary | ICD-10-CM | POA: Diagnosis not present

## 2019-07-31 DIAGNOSIS — I480 Paroxysmal atrial fibrillation: Secondary | ICD-10-CM | POA: Diagnosis not present

## 2019-07-31 DIAGNOSIS — I5032 Chronic diastolic (congestive) heart failure: Secondary | ICD-10-CM | POA: Diagnosis not present

## 2019-07-31 DIAGNOSIS — N1832 Chronic kidney disease, stage 3b: Secondary | ICD-10-CM

## 2019-07-31 MED ORDER — FERROUS SULFATE 324 (65 FE) MG PO TBEC: 1.0000 | DELAYED_RELEASE_TABLET | Freq: Two times a day (BID) | ORAL | 3 refills | Status: DC

## 2019-07-31 NOTE — Progress Notes (Signed)
Subjective:    Patient ID: Whitney [name], female    DOB: Oct 25, 1927, 86 y.o.   MRN: [id number]  HPI  Patient was seen by her endocrinologist last week and was told that she was anemic.  Hemoglobin was 9.2.  Iron studies confirmed iron deficiency anemia.  She denies any melena.  She does occasionally have bright red blood per rectum when she wipes.  This occurs every 3 or 4 months and she attributes it to hemorrhoids.  She denies any copious bright red blood per rectum/hematochezia.  She denies any hematemesis.  She denies any abdominal pain or heartburn or gastritis-like symptoms.  She is not currently taking any iron.  She is on anticoagulation for atrial fibrillation Past Medical History:  Diagnosis Date  . Anemia   . Atrial fibrillation ([place])   . Breast cancer ([place]) 10/2013   right upper outer  . Cancer ([place])    right breast  . Complication of anesthesia    slow to wake up  . Diabetes mellitus without complication ([place])   . Dysrhythmia 10/15   AF  . Former smoker   . Full dentures   . Hyperlipidemia   . Hypertension   . Multinodular goiter   . Osteoporosis   . Radiation    Right Breast  . Thyroid disease    hypothyroidism  . Wears glasses    Past Surgical History:  Procedure Laterality Date  . ABDOMINAL HYSTERECTOMY    . AXILLARY LYMPH NODE DISSECTION Right 01/09/2014   Procedure: RIGHT AXILLARY LYMPH NODE DISECTION;  Surgeon: [name], MD;  Location: [place];  Service: General;  Laterality: Right;  . BREAST LUMPECTOMY WITH RADIOACTIVE SEED LOCALIZATION Right 01/09/2014   Procedure: RIGHT BREAST SEED LOCALIZED LUMPECTOMY ;  Surgeon: [name], MD;  Location: [place];  Service: General;  Laterality: Right;  . EYE SURGERY  12/22/2009   cataracts  . INTRAMEDULLARY (IM) NAIL INTERTROCHANTERIC Left 02/24/2014   Procedure: IM ROD LEFT HIP FX;  Surgeon: Alta Corning, MD;  Location: [place];  Service: Orthopedics;  Laterality:  Left;  . left distal radius fracture     2020  . PORT-A-CATH REMOVAL Right 01/09/2014   Procedure: REMOVAL PORT-A-CATH;  Surgeon: [name], MD;  Location: [place];  Service: General;  Laterality: Right;  . PORTACATH PLACEMENT N/A 11/15/2013   Procedure: INSERTION PORT-A-CATH WITH ULTRA SOUND AND FLOROSCOPY;  Surgeon: [name], MD;  Location: [place];  Service: General;  Laterality: N/A;  . TOTAL KNEE ARTHROPLASTY  2002   left   Current Outpatient Medications on File Prior to Visit  Medication Sig Dispense Refill  . amiodarone (PACERONE) 200 MG tablet Take 1 tablet (200 mg total) by mouth daily. 90 tablet 3  . atorvastatin (LIPITOR) 20 MG tablet Take 1 tablet (20 mg total) by mouth daily. 90 tablet 3  . Blood Glucose Monitoring Suppl (ONETOUCH VERIO) w/Device KIT Use to test blood sugars daily. 1 kit 0  . Calcium Carbonate-Vit D-Min (CALCIUM 1200) 1200-1000 MG-UNIT CHEW Chew 1,200 mg by mouth daily. 30 tablet 3  . Carboxymeth-Glycerin-Polysorb (REFRESH OPTIVE ADVANCED OP) Place 1 drop into both eyes 3 (three) times daily.    . cholecalciferol (VITAMIN D3) 25 MCG (1000 UT) tablet Take 1 tablet (1,000 Units total) by mouth daily. 30 tablet 3  . doxazosin (CARDURA) 4 MG tablet TAKE 1 TABLET BY MOUTH EVERY DAY 90 tablet 2  . ELIQUIS 5 MG TABS tablet TAKE 1  TABLET TWICE A DAY 60 tablet 10  . ferrous sulfate 325 (65 FE) MG tablet Take 325 mg by mouth daily with breakfast.    . furosemide (LASIX) 40 MG tablet TAKE 2 TABLETS BY MOUTH TWICE A DAY 360 tablet 2  . HYDROcodone-acetaminophen (NORCO/VICODIN) 5-325 MG tablet Take 1 tablet by mouth every 4 (four) hours as needed. 8 tablet 0  . JANUVIA 100 MG tablet TAKE 1 TABLET BY MOUTH EVERY DAY (Patient taking differently: Take 50 mg by mouth daily. Take 1/2 tablet (7m total) by mouth once daily.) 90 tablet 0  . KLOR-CON M20 20 MEQ tablet TAKE 1 TABLET BY MOUTH EVERY DAY 90 tablet 0  . lisinopril (ZESTRIL) 20 MG tablet TAKE 1  TABLET BY MOUTH EVERY DAY 90 tablet 3  . meclizine (ANTIVERT) 12.5 MG tablet TAKE 1 TABLET BY MOUTH DAILY AS NEEDED FOR DIZZINESS 30 tablet 2  . metolazone (ZAROXOLYN) 5 MG tablet Take 1 tablet (5 mg total) by mouth daily. 10 tablet 0  . metoprolol succinate (TOPROL-XL) 100 MG 24 hr tablet TAKE 1 TABLET BY MOUTH TWICE A DAY WITH OR IMMEDIATELY FOLLOWING A MEAL 180 tablet 1  . ONETOUCH VERIO test strip CHECK BLOOD SUGAR EVERY DAY AS DIRECTED 100 strip 2  . oxyCODONE (ROXICODONE) 5 MG immediate release tablet Take 1 tablet (5 mg total) by mouth every 6 (six) hours as needed for severe pain (stop tramadol). 30 tablet 0  . potassium chloride (K-DUR) 10 MEQ tablet Take 1 tablet (10 mEq total) by mouth daily. 90 tablet 3  . Sennosides (EX-LAX) 15 MG TABS Take 1 tablet by mouth daily as needed (CONSTIPATION).    .Marland Kitchentamoxifen (NOLVADEX) 20 MG tablet Take 1 tablet (20 mg total) by mouth daily. 90 tablet 3  . traMADol (ULTRAM) 50 MG tablet Take 1 tablet (50 mg total) by mouth every 8 (eight) hours as needed. 60 tablet 0   Current Facility-Administered Medications on File Prior to Visit  Medication Dose Route Frequency Provider Last Rate Last Admin  . denosumab (PROLIA) injection 60 mg  60 mg Subcutaneous Q6 months PSusy Frizzle MD   60 mg at 04/06/19 1422   Allergies  Allergen Reactions  . Tape Other (See Comments)    Skin is somewhat sensitive   Social History   Socioeconomic History  . Marital status: Widowed    Spouse name: Not on file  . Number of children: 4  . Years of education: Not on file  . Highest education level: Not on file  Occupational History  . Not on file  Tobacco Use  . Smoking status: Former Smoker    Packs/day: 1.00    Years: 5.00    Pack years: 5.00    Quit date: 11/14/1958    Years since quitting: 60.7  . Smokeless tobacco: Never Used  Substance and Sexual Activity  . Alcohol use: No    Alcohol/week: 0.0 standard drinks  . Drug use: No  . Sexual activity:  Never    Comment: menarche age 86 PP87 first birth age 86 no HRT, menopause age 86 Other Topics Concern  . Not on file  Social History Narrative  . Not on file   Social Determinants of Health   Financial Resource Strain:   . Difficulty of Paying Living Expenses:   Food Insecurity:   . Worried About RCharity fundraiserin the Last Year:   . RArboriculturistin the Last Year:   Transportation Needs:   .  Lack of Transportation (Medical):   Marland Kitchen Lack of Transportation (Non-Medical):   Physical Activity:   . Days of Exercise per Week:   . Minutes of Exercise per Session:   Stress:   . Feeling of Stress :   Social Connections:   . Frequency of Communication with Friends and Family:   . Frequency of Social Gatherings with Friends and Family:   . Attends Religious Services:   . Active Member of Clubs or Organizations:   . Attends Archivist Meetings:   Marland Kitchen Marital Status:   Intimate Partner Violence:   . Fear of Current or Ex-Partner:   . Emotionally Abused:   Marland Kitchen Physically Abused:   . Sexually Abused:     eReview of Systems  All other systems reviewed and are negative.      Objective:   Physical Exam  Constitutional: She is oriented to person, place, and time. She appears well-developed and well-nourished. No distress.  Neck: No JVD present.  Cardiovascular: Normal rate, normal heart sounds and intact distal pulses. An irregularly irregular rhythm present.  No murmur heard. Pulmonary/Chest: Effort normal and breath sounds normal. No respiratory distress. She has no wheezes. She has no rales.  Abdominal: Soft. Bowel sounds are normal. She exhibits no distension. There is no abdominal tenderness. There is no rebound and no guarding.  Musculoskeletal:        General: Edema present.     Cervical back: Neck supple.  Lymphadenopathy:    She has no cervical adenopathy.  Neurological: She is alert and oriented to person, place, and time. No cranial nerve deficit. She  exhibits normal muscle tone. Coordination normal.  Skin: No rash noted. She is not diaphoretic.  Psychiatric: She has a normal mood and affect. Her behavior is normal. Judgment and thought content normal.  Vitals reviewed.         Assessment & Plan:   Other iron deficiency anemia  Chronic diastolic CHF (congestive heart failure) (HCC)  Stage 3b chronic kidney disease  Paroxysmal atrial fibrillation [facility])  Patient is anticoagulated due to paroxysmal atrial fibrillation.  She also has chronic kidney disease contributing to her anemia.  She occasionally has blood in her stool.  However she also has iron deficiency.  Begin ferrous sulfate 325 mg p.o. twice daily and recheck CBC in 1 month.  Would not discontinue anticoagulation at the present time due to the benefit of stroke prevention.  If patient's iron levels are not improving and she remains anemic could consider iron infusions for malabsorption.

## 2019-08-15 ENCOUNTER — Other Ambulatory Visit: Payer: Self-pay | Admitting: Family Medicine

## 2019-08-22 ENCOUNTER — Other Ambulatory Visit: Payer: Self-pay | Admitting: Family Medicine

## 2019-08-31 ENCOUNTER — Other Ambulatory Visit: Payer: Self-pay | Admitting: Family Medicine

## 2019-09-01 ENCOUNTER — Ambulatory Visit (INDEPENDENT_AMBULATORY_CARE_PROVIDER_SITE_OTHER): Payer: Medicare Other | Admitting: Family Medicine

## 2019-09-01 ENCOUNTER — Encounter: Payer: Self-pay | Admitting: Family Medicine

## 2019-09-01 ENCOUNTER — Other Ambulatory Visit: Payer: Self-pay

## 2019-09-01 VITALS — BP 130/82 | HR 55 | Temp 98.1°F | Ht 66.5 in | Wt 163.0 lb

## 2019-09-01 DIAGNOSIS — I480 Paroxysmal atrial fibrillation: Secondary | ICD-10-CM | POA: Diagnosis not present

## 2019-09-01 DIAGNOSIS — N1832 Chronic kidney disease, stage 3b: Secondary | ICD-10-CM | POA: Diagnosis not present

## 2019-09-01 DIAGNOSIS — D508 Other iron deficiency anemias: Secondary | ICD-10-CM | POA: Diagnosis not present

## 2019-09-01 LAB — CBC WITH DIFFERENTIAL/PLATELET
Absolute Monocytes: 317 cells/uL (ref 200–950)
Basophils Absolute: 18 cells/uL (ref 0–200)
Basophils Relative: 0.5 %
Eosinophils Absolute: 18 cells/uL (ref 15–500)
Eosinophils Relative: 0.5 %
HCT: 28.9 % — ABNORMAL LOW (ref 35.0–45.0)
Hemoglobin: 9.4 g/dL — ABNORMAL LOW (ref 11.7–15.5)
Lymphs Abs: 594 cells/uL — ABNORMAL LOW (ref 850–3900)
MCH: 32.4 pg (ref 27.0–33.0)
MCHC: 32.5 g/dL (ref 32.0–36.0)
MCV: 99.7 fL (ref 80.0–100.0)
MPV: 10.6 fL (ref 7.5–12.5)
Monocytes Relative: 8.8 %
Neutro Abs: 2653 cells/uL (ref 1500–7800)
Neutrophils Relative %: 73.7 %
Platelets: 189 10*3/uL (ref 140–400)
RBC: 2.9 10*6/uL — ABNORMAL LOW (ref 3.80–5.10)
RDW: 14 % (ref 11.0–15.0)
Total Lymphocyte: 16.5 %
WBC: 3.6 10*3/uL — ABNORMAL LOW (ref 3.8–10.8)

## 2019-09-01 LAB — BASIC METABOLIC PANEL WITH GFR
BUN/Creatinine Ratio: 21 (calc) (ref 6–22)
BUN: 26 mg/dL — ABNORMAL HIGH (ref 7–25)
CO2: 28 mmol/L (ref 20–32)
Calcium: 8.7 mg/dL (ref 8.6–10.4)
Chloride: 105 mmol/L (ref 98–110)
Creat: 1.26 mg/dL — ABNORMAL HIGH (ref 0.60–0.88)
GFR, Est African American: 44 mL/min/{1.73_m2} — ABNORMAL LOW (ref >=60)
GFR, Est Non African American: 38 mL/min/{1.73_m2} — ABNORMAL LOW (ref >=60)
Glucose, Bld: 186 mg/dL — ABNORMAL HIGH (ref 65–99)
Potassium: 3.8 mmol/L (ref 3.5–5.3)
Sodium: 140 mmol/L (ref 135–146)

## 2019-09-01 NOTE — Addendum Note (Signed)
Addended by: Jenna Luo T on: 09/01/2019 10:26 AM   Modules accepted: Orders

## 2019-09-01 NOTE — Progress Notes (Signed)
Subjective:    Patient ID: Whitney [name], female    DOB: [birth year], 86 y.o.   MRN: [id number]  HPI 07/31/19 Patient was seen by her endocrinologist last week and was told that she was anemic.  Hemoglobin was 9.2.  Iron studies confirmed iron deficiency anemia.  She denies any melena.  She does occasionally have bright red blood per rectum when she wipes.  This occurs every 3 or 4 months and she attributes it to hemorrhoids.  She denies any copious bright red blood per rectum/hematochezia.  She denies any hematemesis.  She denies any abdominal pain or heartburn or gastritis-like symptoms.  She is not currently taking any iron.  She is on anticoagulation for atrial fibrillation.  At that time, my plan was: Patient is anticoagulated due to paroxysmal atrial fibrillation.  She also has chronic kidney disease contributing to her anemia.  She occasionally has blood in her stool.  However she also has iron deficiency.  Begin ferrous sulfate 325 mg p.o. twice daily and recheck CBC in 1 month.  Would not discontinue anticoagulation at the present time due to the benefit of stroke prevention.  If patient's iron levels are not improving and she remains anemic could consider iron infusions for malabsorption.  09/01/19 Patient is here today for recheck.  She has been taking the iron every day since I last saw her.  She does report more energy.  She denies any chest pain.  She denies any shortness of breath.  She denies any significant dyspnea on exertion.  However she is extremely sedentary due to her age and pain in her right knee.  She does report black stool ever since she started the iron.  She denies any bright red blood per rectum.  She denies any abdominal pain, heartburn, or abdominal upset. Past Medical History:  Diagnosis Date  . Anemia   . Atrial fibrillation ([place])   . Breast cancer ([place]) 10/2013   right upper outer  . Cancer ([place])    right breast  . Complication of anesthesia    slow to wake  up  . Diabetes mellitus without complication ([place])   . Dysrhythmia 10/15   AF  . Former smoker   . Full dentures   . Hyperlipidemia   . Hypertension   . Multinodular goiter   . Osteoporosis   . Radiation    Right Breast  . Thyroid disease    hypothyroidism  . Wears glasses    Past Surgical History:  Procedure Laterality Date  . ABDOMINAL HYSTERECTOMY    . AXILLARY LYMPH NODE DISSECTION Right 01/09/2014   Procedure: RIGHT AXILLARY LYMPH NODE DISECTION;  Surgeon: [name], MD;  Location: [place];  Service: General;  Laterality: Right;  . BREAST LUMPECTOMY WITH RADIOACTIVE SEED LOCALIZATION Right 01/09/2014   Procedure: RIGHT BREAST SEED LOCALIZED LUMPECTOMY ;  Surgeon: [name], MD;  Location: [place];  Service: General;  Laterality: Right;  . EYE SURGERY  12/22/2009   cataracts  . INTRAMEDULLARY (IM) NAIL INTERTROCHANTERIC Left 02/24/2014   Procedure: IM ROD LEFT HIP FX;  Surgeon: Alta Corning, MD;  Location: [place];  Service: Orthopedics;  Laterality: Left;  . left distal radius fracture     2020  . PORT-A-CATH REMOVAL Right 01/09/2014   Procedure: REMOVAL PORT-A-CATH;  Surgeon: [name], MD;  Location: [place];  Service: General;  Laterality: Right;  . PORTACATH PLACEMENT N/A 11/15/2013   Procedure: INSERTION PORT-A-CATH WITH ULTRA  SOUND AND FLOROSCOPY;  Surgeon: [name], MD;  Location: [place];  Service: General;  Laterality: N/A;  . TOTAL KNEE ARTHROPLASTY  2002   left   Current Outpatient Medications on File Prior to Visit  Medication Sig Dispense Refill  . amiodarone (PACERONE) 200 MG tablet Take 1 tablet (200 mg total) by mouth daily. 90 tablet 3  . atorvastatin (LIPITOR) 20 MG tablet Take 1 tablet (20 mg total) by mouth daily. 90 tablet 3  . Blood Glucose Monitoring Suppl (ONETOUCH VERIO) w/Device KIT Use to test blood sugars daily. 1 kit 0  . Calcium Carbonate-Vit D-Min (CALCIUM 1200) 1200-1000  MG-UNIT CHEW Chew 1,200 mg by mouth daily. 30 tablet 3  . Carboxymeth-Glycerin-Polysorb (REFRESH OPTIVE ADVANCED OP) Place 1 drop into both eyes 3 (three) times daily.    . cholecalciferol (VITAMIN D3) 25 MCG (1000 UT) tablet Take 1 tablet (1,000 Units total) by mouth daily. 30 tablet 3  . doxazosin (CARDURA) 4 MG tablet TAKE 1 TABLET BY MOUTH EVERY DAY 90 tablet 2  . ELIQUIS 5 MG TABS tablet TAKE 1 TABLET BY MOUTH TWICE A DAY 60 tablet 5  . ferrous sulfate 324 (65 Fe) MG TBEC Take 1 tablet (325 mg total) by mouth in the morning and at bedtime. 60 tablet 3  . furosemide (LASIX) 40 MG tablet TAKE 2 TABLETS BY MOUTH TWICE A DAY 360 tablet 2  . HYDROcodone-acetaminophen (NORCO/VICODIN) 5-325 MG tablet Take 1 tablet by mouth every 4 (four) hours as needed. 8 tablet 0  . KLOR-CON M20 20 MEQ tablet TAKE 1 TABLET BY MOUTH EVERY DAY 90 tablet 0  . lisinopril (ZESTRIL) 20 MG tablet TAKE 1 TABLET BY MOUTH EVERY DAY 90 tablet 3  . meclizine (ANTIVERT) 12.5 MG tablet TAKE 1 TABLET BY MOUTH DAILY AS NEEDED FOR DIZZINESS 30 tablet 2  . metolazone (ZAROXOLYN) 5 MG tablet Take 1 tablet (5 mg total) by mouth daily. 10 tablet 0  . metoprolol succinate (TOPROL-XL) 100 MG 24 hr tablet TAKE 1 TABLET BY MOUTH TWICE A DAY WITH OR IMMEDIATELY FOLLOWING A MEAL 180 tablet 1  . ONETOUCH VERIO test strip CHECK BLOOD SUGAR EVERY DAY AS DIRECTED 100 strip 2  . oxyCODONE (ROXICODONE) 5 MG immediate release tablet Take 1 tablet (5 mg total) by mouth every 6 (six) hours as needed for severe pain (stop tramadol). 30 tablet 0  . potassium chloride (K-DUR) 10 MEQ tablet Take 1 tablet (10 mEq total) by mouth daily. 90 tablet 3  . Sennosides (EX-LAX) 15 MG TABS Take 1 tablet by mouth daily as needed (CONSTIPATION).    Marland Kitchen sitaGLIPtin (JANUVIA) 100 MG tablet Take 0.5 tablets (50 mg total) by mouth daily. Take 1/2 tablet (90m total) by mouth once daily. 90 tablet 1  . tamoxifen (NOLVADEX) 20 MG tablet Take 1 tablet (20 mg total) by mouth  daily. 90 tablet 3  . traMADol (ULTRAM) 50 MG tablet Take 1 tablet (50 mg total) by mouth every 8 (eight) hours as needed. 60 tablet 0   Current Facility-Administered Medications on File Prior to Visit  Medication Dose Route Frequency Provider Last Rate Last Admin  . denosumab (PROLIA) injection 60 mg  60 mg Subcutaneous Q6 months PSusy Frizzle MD   60 mg at 04/06/19 1422   Allergies  Allergen Reactions  . Tape Other (See Comments)    Skin is somewhat sensitive   Social History   Socioeconomic History  . Marital status: Widowed    Spouse name: Not  on file  . Number of children: 4  . Years of education: Not on file  . Highest education level: Not on file  Occupational History  . Not on file  Tobacco Use  . Smoking status: Former Smoker    Packs/day: 1.00    Years: 5.00    Pack years: 5.00    Quit date: 11/14/1958    Years since quitting: 60.8  . Smokeless tobacco: Never Used  Vaping Use  . Vaping Use: Never used  Substance and Sexual Activity  . Alcohol use: No    Alcohol/week: 0.0 standard drinks  . Drug use: No  . Sexual activity: Never    Comment: menarche age 89, P11, first birth age 24, no HRT, menopause age 18  Other Topics Concern  . Not on file  Social History Narrative  . Not on file   Social Determinants of Health   Financial Resource Strain:   . Difficulty of Paying Living Expenses:   Food Insecurity:   . Worried About Charity fundraiser in the Last Year:   . Arboriculturist in the Last Year:   Transportation Needs:   . Film/video editor (Medical):   Marland Kitchen Lack of Transportation (Non-Medical):   Physical Activity:   . Days of Exercise per Week:   . Minutes of Exercise per Session:   Stress:   . Feeling of Stress :   Social Connections:   . Frequency of Communication with Friends and Family:   . Frequency of Social Gatherings with Friends and Family:   . Attends Religious Services:   . Active Member of Clubs or Organizations:   . Attends  Archivist Meetings:   Marland Kitchen Marital Status:   Intimate Partner Violence:   . Fear of Current or Ex-Partner:   . Emotionally Abused:   Marland Kitchen Physically Abused:   . Sexually Abused:     eReview of Systems  All other systems reviewed and are negative.      Objective:   Physical Exam Vitals reviewed.  Constitutional:      General: She is not in acute distress.    Appearance: She is well-developed. She is not diaphoretic.  Neck:     Vascular: No JVD.  Cardiovascular:     Rate and Rhythm: Normal rate. Rhythm irregularly irregular.     Heart sounds: Normal heart sounds. No murmur heard.   Pulmonary:     Effort: Pulmonary effort is normal. No respiratory distress.     Breath sounds: Normal breath sounds. No wheezing or rales.  Abdominal:     General: Bowel sounds are normal. There is no distension.     Palpations: Abdomen is soft.     Tenderness: There is no abdominal tenderness. There is no guarding or rebound.  Musculoskeletal:     Cervical back: Neck supple.  Lymphadenopathy:     Cervical: No cervical adenopathy.  Skin:    Findings: No rash.  Neurological:     Mental Status: She is alert and oriented to person, place, and time.     Cranial Nerves: No cranial nerve deficit.     Motor: No abnormal muscle tone.     Coordination: Coordination normal.  Psychiatric:        Behavior: Behavior normal.        Thought Content: Thought content normal.        Judgment: Judgment normal.           Assessment & Plan:  Other iron  deficiency anemia - Plan: CBC with Differential/Platelet, BASIC METABOLIC PANEL WITH GFR, Anemia panel, Fecal Globin By Immunochemistry  Paroxysmal atrial fibrillation (HCC)  Stage 3b chronic kidney disease  Repeat CBC today along with iron level and percent saturation.  If patient remains profoundly anemic and iron levels remain low, she may benefit from iron infusion.  If she remains significantly anemic I will also check her stool for blood as  she may be losing blood due to anticoagulation especially if her hemoglobin is dropping.  If iron levels are normal and she remains profoundly anemic in her stool does not demonstrate any blood, this could be anemia due to chronic disease as well as anemia secondary to chronic kidney disease and she may benefit from erythropoietin injections via nephrology.

## 2019-09-02 LAB — IRON,TIBC AND FERRITIN PANEL
%SAT: 25 % (calc) (ref 16–45)
Ferritin: 44 ng/mL (ref 16–288)
Iron: 64 ug/dL (ref 45–160)
TIBC: 260 mcg/dL (calc) (ref 250–450)

## 2019-09-02 LAB — RETICULOCYTES
ABS Retic: 52200 cells/uL (ref 20000–8000)
Retic Ct Pct: 1.8 %

## 2019-09-04 ENCOUNTER — Other Ambulatory Visit: Payer: Self-pay

## 2019-09-04 ENCOUNTER — Other Ambulatory Visit: Payer: Medicare Other

## 2019-09-04 DIAGNOSIS — D508 Other iron deficiency anemias: Secondary | ICD-10-CM | POA: Diagnosis not present

## 2019-09-05 ENCOUNTER — Other Ambulatory Visit: Payer: Self-pay

## 2019-09-05 ENCOUNTER — Telehealth: Payer: Self-pay

## 2019-09-05 DIAGNOSIS — R195 Other fecal abnormalities: Secondary | ICD-10-CM

## 2019-09-05 DIAGNOSIS — D649 Anemia, unspecified: Secondary | ICD-10-CM

## 2019-09-05 LAB — FECAL GLOBIN BY IMMUNOCHEMISTRY
FECAL GLOBIN RESULT:: DETECTED — AB
MICRO NUMBER:: 10614445
SPECIMEN QUALITY:: ADEQUATE

## 2019-09-05 NOTE — Telephone Encounter (Signed)
-----   Message from [name], MD sent at 09/05/2019  6:46 AM EDT ----- Patient stool test showed positive for blood.  Please schedule the patient to see gastroenterology for anemia with blood in the stool

## 2019-09-14 ENCOUNTER — Other Ambulatory Visit: Payer: Self-pay | Admitting: Endocrinology

## 2019-09-20 DIAGNOSIS — E119 Type 2 diabetes mellitus without complications: Secondary | ICD-10-CM | POA: Diagnosis not present

## 2019-09-27 ENCOUNTER — Other Ambulatory Visit: Payer: Self-pay | Admitting: Family Medicine

## 2019-09-27 DIAGNOSIS — I10 Essential (primary) hypertension: Secondary | ICD-10-CM

## 2019-10-05 ENCOUNTER — Ambulatory Visit (INDEPENDENT_AMBULATORY_CARE_PROVIDER_SITE_OTHER): Payer: Medicare Other | Admitting: Family Medicine

## 2019-10-05 ENCOUNTER — Other Ambulatory Visit: Payer: Self-pay

## 2019-10-05 DIAGNOSIS — M810 Age-related osteoporosis without current pathological fracture: Secondary | ICD-10-CM

## 2019-10-09 ENCOUNTER — Other Ambulatory Visit: Payer: Self-pay | Admitting: Family Medicine

## 2019-10-09 ENCOUNTER — Other Ambulatory Visit: Payer: Self-pay | Admitting: Hematology and Oncology

## 2019-10-09 DIAGNOSIS — Z1231 Encounter for screening mammogram for malignant neoplasm of breast: Secondary | ICD-10-CM

## 2019-10-10 ENCOUNTER — Encounter: Payer: Self-pay | Admitting: Nurse Practitioner

## 2019-10-24 ENCOUNTER — Inpatient Hospital Stay (HOSPITAL_COMMUNITY)
Admission: EM | Admit: 2019-10-24 | Discharge: 2019-10-30 | DRG: 393 | Disposition: A | Discharge status: Home / Self Care | Payer: Medicare Other | Attending: Internal Medicine | Admitting: Internal Medicine

## 2019-10-24 ENCOUNTER — Encounter (HOSPITAL_COMMUNITY): Payer: Self-pay | Admitting: Family Medicine

## 2019-10-24 ENCOUNTER — Other Ambulatory Visit: Payer: Self-pay

## 2019-10-24 ENCOUNTER — Observation Stay (HOSPITAL_COMMUNITY): Payer: Medicare Other

## 2019-10-24 DIAGNOSIS — K56699 Other intestinal obstruction unspecified as to partial versus complete obstruction: Secondary | ICD-10-CM | POA: Diagnosis present

## 2019-10-24 DIAGNOSIS — K529 Noninfective gastroenteritis and colitis, unspecified: Secondary | ICD-10-CM | POA: Diagnosis not present

## 2019-10-24 DIAGNOSIS — Z91048 Other nonmedicinal substance allergy status: Secondary | ICD-10-CM

## 2019-10-24 DIAGNOSIS — K625 Hemorrhage of anus and rectum: Secondary | ICD-10-CM | POA: Diagnosis not present

## 2019-10-24 DIAGNOSIS — D123 Benign neoplasm of transverse colon: Secondary | ICD-10-CM | POA: Diagnosis not present

## 2019-10-24 DIAGNOSIS — K633 Ulcer of intestine: Secondary | ICD-10-CM | POA: Diagnosis not present

## 2019-10-24 DIAGNOSIS — D122 Benign neoplasm of ascending colon: Principal | ICD-10-CM | POA: Diagnosis present

## 2019-10-24 DIAGNOSIS — E039 Hypothyroidism, unspecified: Secondary | ICD-10-CM | POA: Diagnosis present

## 2019-10-24 DIAGNOSIS — J80 Acute respiratory distress syndrome: Secondary | ICD-10-CM | POA: Diagnosis not present

## 2019-10-24 DIAGNOSIS — Z7901 Long term (current) use of anticoagulants: Secondary | ICD-10-CM | POA: Diagnosis not present

## 2019-10-24 DIAGNOSIS — M810 Age-related osteoporosis without current pathological fracture: Secondary | ICD-10-CM | POA: Diagnosis present

## 2019-10-24 DIAGNOSIS — D539 Nutritional anemia, unspecified: Secondary | ICD-10-CM | POA: Diagnosis present

## 2019-10-24 DIAGNOSIS — I5032 Chronic diastolic (congestive) heart failure: Secondary | ICD-10-CM | POA: Diagnosis not present

## 2019-10-24 DIAGNOSIS — I480 Paroxysmal atrial fibrillation: Secondary | ICD-10-CM | POA: Diagnosis present

## 2019-10-24 DIAGNOSIS — K922 Gastrointestinal hemorrhage, unspecified: Secondary | ICD-10-CM

## 2019-10-24 DIAGNOSIS — Z20822 Contact with and (suspected) exposure to covid-19: Secondary | ICD-10-CM | POA: Diagnosis not present

## 2019-10-24 DIAGNOSIS — K228 Other specified diseases of esophagus: Secondary | ICD-10-CM | POA: Diagnosis not present

## 2019-10-24 DIAGNOSIS — I272 Pulmonary hypertension, unspecified: Secondary | ICD-10-CM | POA: Diagnosis not present

## 2019-10-24 DIAGNOSIS — Z170 Estrogen receptor positive status [ER+]: Secondary | ICD-10-CM

## 2019-10-24 DIAGNOSIS — K641 Second degree hemorrhoids: Secondary | ICD-10-CM | POA: Diagnosis not present

## 2019-10-24 DIAGNOSIS — D500 Iron deficiency anemia secondary to blood loss (chronic): Secondary | ICD-10-CM | POA: Diagnosis not present

## 2019-10-24 DIAGNOSIS — I130 Hypertensive heart and chronic kidney disease with heart failure and stage 1 through stage 4 chronic kidney disease, or unspecified chronic kidney disease: Secondary | ICD-10-CM | POA: Diagnosis not present

## 2019-10-24 DIAGNOSIS — E785 Hyperlipidemia, unspecified: Secondary | ICD-10-CM | POA: Diagnosis present

## 2019-10-24 DIAGNOSIS — I499 Cardiac arrhythmia, unspecified: Secondary | ICD-10-CM | POA: Diagnosis not present

## 2019-10-24 DIAGNOSIS — K449 Diaphragmatic hernia without obstruction or gangrene: Secondary | ICD-10-CM | POA: Diagnosis not present

## 2019-10-24 DIAGNOSIS — D649 Anemia, unspecified: Secondary | ICD-10-CM | POA: Diagnosis present

## 2019-10-24 DIAGNOSIS — D689 Coagulation defect, unspecified: Secondary | ICD-10-CM | POA: Diagnosis not present

## 2019-10-24 DIAGNOSIS — D62 Acute posthemorrhagic anemia: Secondary | ICD-10-CM | POA: Diagnosis not present

## 2019-10-24 DIAGNOSIS — Z96652 Presence of left artificial knee joint: Secondary | ICD-10-CM | POA: Diagnosis present

## 2019-10-24 DIAGNOSIS — D490 Neoplasm of unspecified behavior of digestive system: Secondary | ICD-10-CM | POA: Diagnosis not present

## 2019-10-24 DIAGNOSIS — D120 Benign neoplasm of cecum: Secondary | ICD-10-CM | POA: Diagnosis present

## 2019-10-24 DIAGNOSIS — N1831 Chronic kidney disease, stage 3a: Secondary | ICD-10-CM

## 2019-10-24 DIAGNOSIS — K644 Residual hemorrhoidal skin tags: Secondary | ICD-10-CM | POA: Diagnosis not present

## 2019-10-24 DIAGNOSIS — E279 Disorder of adrenal gland, unspecified: Secondary | ICD-10-CM | POA: Diagnosis present

## 2019-10-24 DIAGNOSIS — N1832 Chronic kidney disease, stage 3b: Secondary | ICD-10-CM | POA: Diagnosis present

## 2019-10-24 DIAGNOSIS — E1122 Type 2 diabetes mellitus with diabetic chronic kidney disease: Secondary | ICD-10-CM | POA: Diagnosis present

## 2019-10-24 DIAGNOSIS — K921 Melena: Secondary | ICD-10-CM | POA: Diagnosis present

## 2019-10-24 DIAGNOSIS — D61818 Other pancytopenia: Secondary | ICD-10-CM | POA: Diagnosis present

## 2019-10-24 DIAGNOSIS — D696 Thrombocytopenia, unspecified: Secondary | ICD-10-CM | POA: Diagnosis present

## 2019-10-24 DIAGNOSIS — Z79899 Other long term (current) drug therapy: Secondary | ICD-10-CM

## 2019-10-24 DIAGNOSIS — E042 Nontoxic multinodular goiter: Secondary | ICD-10-CM | POA: Diagnosis present

## 2019-10-24 DIAGNOSIS — E1169 Type 2 diabetes mellitus with other specified complication: Secondary | ICD-10-CM | POA: Diagnosis present

## 2019-10-24 DIAGNOSIS — I700 Atherosclerosis of aorta: Secondary | ICD-10-CM | POA: Diagnosis present

## 2019-10-24 DIAGNOSIS — E118 Type 2 diabetes mellitus with unspecified complications: Secondary | ICD-10-CM

## 2019-10-24 DIAGNOSIS — Z743 Need for continuous supervision: Secondary | ICD-10-CM | POA: Diagnosis not present

## 2019-10-24 DIAGNOSIS — Z87891 Personal history of nicotine dependence: Secondary | ICD-10-CM

## 2019-10-24 DIAGNOSIS — K5731 Diverticulosis of large intestine without perforation or abscess with bleeding: Secondary | ICD-10-CM | POA: Diagnosis not present

## 2019-10-24 DIAGNOSIS — K635 Polyp of colon: Secondary | ICD-10-CM | POA: Diagnosis not present

## 2019-10-24 DIAGNOSIS — R58 Hemorrhage, not elsewhere classified: Secondary | ICD-10-CM | POA: Diagnosis not present

## 2019-10-24 DIAGNOSIS — K5790 Diverticulosis of intestine, part unspecified, without perforation or abscess without bleeding: Secondary | ICD-10-CM | POA: Diagnosis not present

## 2019-10-24 DIAGNOSIS — Z923 Personal history of irradiation: Secondary | ICD-10-CM

## 2019-10-24 DIAGNOSIS — Z853 Personal history of malignant neoplasm of breast: Secondary | ICD-10-CM | POA: Diagnosis not present

## 2019-10-24 DIAGNOSIS — C50411 Malignant neoplasm of upper-outer quadrant of right female breast: Secondary | ICD-10-CM | POA: Diagnosis present

## 2019-10-24 DIAGNOSIS — I10 Essential (primary) hypertension: Secondary | ICD-10-CM | POA: Diagnosis present

## 2019-10-24 DIAGNOSIS — K5721 Diverticulitis of large intestine with perforation and abscess with bleeding: Secondary | ICD-10-CM | POA: Diagnosis present

## 2019-10-24 DIAGNOSIS — R6889 Other general symptoms and signs: Secondary | ICD-10-CM | POA: Diagnosis not present

## 2019-10-24 LAB — COMPREHENSIVE METABOLIC PANEL
ALT: 25 U/L (ref 0–44)
AST: 34 U/L (ref 15–41)
Albumin: 2.7 g/dL — ABNORMAL LOW (ref 3.5–5.0)
Alkaline Phosphatase: 25 U/L — ABNORMAL LOW (ref 38–126)
Anion gap: 8 (ref 5–15)
BUN: 27 mg/dL — ABNORMAL HIGH (ref 8–23)
CO2: 26 mmol/L (ref 22–32)
Calcium: 8 mg/dL — ABNORMAL LOW (ref 8.9–10.3)
Chloride: 107 mmol/L (ref 98–111)
Creatinine, Ser: 1.41 mg/dL — ABNORMAL HIGH (ref 0.44–1.00)
GFR calc Af Amer: 38 mL/min — ABNORMAL LOW (ref >60)
GFR calc non Af Amer: 33 mL/min — ABNORMAL LOW (ref >60)
Glucose, Bld: 190 mg/dL — ABNORMAL HIGH (ref 70–99)
Potassium: 3.7 mmol/L (ref 3.5–5.1)
Sodium: 141 mmol/L (ref 135–145)
Total Bilirubin: 0.5 mg/dL (ref 0.3–1.2)
Total Protein: 5.2 g/dL — ABNORMAL LOW (ref 6.5–8.1)

## 2019-10-24 LAB — GLUCOSE, CAPILLARY
Glucose-Capillary: 162 mg/dL — ABNORMAL HIGH (ref 70–99)
Glucose-Capillary: 103 mg/dL — ABNORMAL HIGH (ref 70–99)
Glucose-Capillary: 165 mg/dL — ABNORMAL HIGH (ref 70–99)

## 2019-10-24 LAB — CBC WITH DIFFERENTIAL/PLATELET
Abs Immature Granulocytes: 0.01 10*3/uL (ref 0.00–0.07)
Basophils Absolute: 0 10*3/uL (ref 0.0–0.1)
Basophils Relative: 1 %
Eosinophils Absolute: 0 10*3/uL (ref 0.0–0.5)
Eosinophils Relative: 1 %
HCT: 22 % — ABNORMAL LOW (ref 36.0–46.0)
Hemoglobin: 7 g/dL — ABNORMAL LOW (ref 12.0–15.0)
Immature Granulocytes: 0 %
Lymphocytes Relative: 11 %
Lymphs Abs: 0.4 10*3/uL — ABNORMAL LOW (ref 0.7–4.0)
MCH: 33.3 pg (ref 26.0–34.0)
MCHC: 31.8 g/dL (ref 30.0–36.0)
MCV: 104.8 fL — ABNORMAL HIGH (ref 80.0–100.0)
Monocytes Absolute: 0.4 10*3/uL (ref 0.1–1.0)
Monocytes Relative: 11 %
Neutro Abs: 2.6 10*3/uL (ref 1.7–7.7)
Neutrophils Relative %: 76 %
Platelets: 131 10*3/uL — ABNORMAL LOW (ref 150–400)
RBC: 2.1 MIL/uL — ABNORMAL LOW (ref 3.87–5.11)
RDW: 15.3 % (ref 11.5–15.5)
WBC: 3.4 10*3/uL — ABNORMAL LOW (ref 4.0–10.5)
nRBC: 0 % (ref 0.0–0.2)

## 2019-10-24 LAB — IRON AND TIBC
Iron: 49 ug/dL (ref 28–170)
Saturation Ratios: 19 % (ref 10.4–31.8)
TIBC: 263 ug/dL (ref 250–450)
UIBC: 214 ug/dL

## 2019-10-24 LAB — HEMOGLOBIN AND HEMATOCRIT, BLOOD
HCT: 23.7 % — ABNORMAL LOW (ref 36.0–46.0)
HCT: 20.5 % — ABNORMAL LOW (ref 36.0–46.0)
Hemoglobin: 7.6 g/dL — ABNORMAL LOW (ref 12.0–15.0)
Hemoglobin: 6.7 g/dL — CL (ref 12.0–15.0)

## 2019-10-24 LAB — PREPARE RBC (CROSSMATCH)

## 2019-10-24 LAB — RETICULOCYTES
Immature Retic Fract: 15.5 % (ref 2.3–15.9)
RBC.: 2.04 MIL/uL — ABNORMAL LOW (ref 3.87–5.11)
Retic Count, Absolute: 37.1 10*3/uL (ref 19.0–186.0)
Retic Ct Pct: 1.8 % (ref 0.4–3.1)

## 2019-10-24 LAB — POC OCCULT BLOOD, ED: Fecal Occult Bld: POSITIVE — AB

## 2019-10-24 LAB — SARS CORONAVIRUS 2 BY RT PCR (HOSPITAL ORDER, PERFORMED IN ~~LOC~~ HOSPITAL LAB): SARS Coronavirus 2: NEGATIVE

## 2019-10-24 LAB — FERRITIN: Ferritin: 31 ng/mL (ref 11–307)

## 2019-10-24 LAB — CBG MONITORING, ED
Glucose-Capillary: 158 mg/dL — ABNORMAL HIGH (ref 70–99)
Glucose-Capillary: 155 mg/dL — ABNORMAL HIGH (ref 70–99)

## 2019-10-24 LAB — FOLATE: Folate: 11.9 ng/mL (ref >5.9)

## 2019-10-24 LAB — VITAMIN B12: Vitamin B-12: 230 pg/mL (ref 180–914)

## 2019-10-24 MED ORDER — ATORVASTATIN CALCIUM 10 MG PO TABS
10.0000 mg | ORAL_TABLET | Freq: Every day | ORAL | Status: DC
  Administered 2019-10-24 – 2019-10-30 (×7): 10 mg via ORAL
  Filled 2019-10-24 (×8): qty 1

## 2019-10-24 MED ORDER — AMIODARONE HCL 200 MG PO TABS
200.0000 mg | ORAL_TABLET | Freq: Every day | ORAL | Status: DC
  Administered 2019-10-24 – 2019-10-30 (×7): 200 mg via ORAL
  Filled 2019-10-24 (×7): qty 1

## 2019-10-24 MED ORDER — ONDANSETRON HCL 4 MG PO TABS: 4.0000 mg | ORAL_TABLET | Freq: Four times a day (QID) | ORAL | Status: DC | PRN

## 2019-10-24 MED ORDER — TAMOXIFEN CITRATE 10 MG PO TABS
20.0000 mg | ORAL_TABLET | Freq: Every day | ORAL | Status: DC
  Administered 2019-10-24 – 2019-10-30 (×6): 20 mg via ORAL
  Filled 2019-10-24 (×7): qty 2

## 2019-10-24 MED ORDER — INSULIN ASPART 100 UNIT/ML ~~LOC~~ SOLN
0.0000 [IU] | SUBCUTANEOUS | Status: DC
  Administered 2019-10-24 (×2): 2 [IU] via SUBCUTANEOUS
  Administered 2019-10-25 – 2019-10-26 (×5): 1 [IU] via SUBCUTANEOUS
  Administered 2019-10-27 (×2): 2 [IU] via SUBCUTANEOUS
  Administered 2019-10-28 (×2): 1 [IU] via SUBCUTANEOUS
  Administered 2019-10-28 (×2): 2 [IU] via SUBCUTANEOUS
  Administered 2019-10-29 – 2019-10-30 (×4): 1 [IU] via SUBCUTANEOUS
  Administered 2019-10-30: 3 [IU] via SUBCUTANEOUS
  Filled 2019-10-24: qty 0.09

## 2019-10-24 MED ORDER — DOXAZOSIN MESYLATE 2 MG PO TABS
2.0000 mg | ORAL_TABLET | Freq: Every day | ORAL | Status: DC
  Administered 2019-10-25 – 2019-10-30 (×5): 2 mg via ORAL
  Filled 2019-10-24 (×8): qty 1

## 2019-10-24 MED ORDER — ONDANSETRON HCL 4 MG/2ML IJ SOLN: 4.0000 mg | Freq: Four times a day (QID) | INTRAMUSCULAR | Status: DC | PRN

## 2019-10-24 MED ORDER — SODIUM CHLORIDE 0.9% IV SOLUTION: Freq: Once | INTRAVENOUS | Status: AC

## 2019-10-24 MED ORDER — TECHNETIUM TC 99M-LABELED RED BLOOD CELLS IV KIT
22.0000 | PACK | Freq: Once | INTRAVENOUS | Status: AC | PRN
  Administered 2019-10-24: 22 via INTRAVENOUS

## 2019-10-24 MED ORDER — METOPROLOL SUCCINATE ER 50 MG PO TB24
100.0000 mg | ORAL_TABLET | Freq: Every day | ORAL | Status: DC
  Administered 2019-10-25 – 2019-10-30 (×6): 100 mg via ORAL
  Filled 2019-10-24 (×7): qty 2

## 2019-10-24 MED ORDER — SODIUM CHLORIDE 0.9% FLUSH
3.0000 mL | Freq: Two times a day (BID) | INTRAVENOUS | Status: DC
  Administered 2019-10-24 – 2019-10-30 (×12): 3 mL via INTRAVENOUS

## 2019-10-24 MED ORDER — SODIUM CHLORIDE 0.9 % IV SOLN: 250.0000 mL | INTRAVENOUS | Status: DC | PRN

## 2019-10-24 MED ORDER — SODIUM CHLORIDE 0.9% FLUSH: 3.0000 mL | INTRAVENOUS | Status: DC | PRN

## 2019-10-24 NOTE — Progress Notes (Signed)
Pt's new  Hgb-6.7. Pt had order to transfuse one unit blood. Blood is transfusing now and also paged  to [employer]  For new Hgb -6.7 result. Will continue to monitor.

## 2019-10-24 NOTE — ED Triage Notes (Signed)
Patient states rectal bleeding for about week.  Denies any other symptoms.

## 2019-10-24 NOTE — Progress Notes (Signed)
MD notified of continued episodes of bleeding from rectum. New Hemoglobin of 7.6 reported. Orders to follow.

## 2019-10-24 NOTE — H&P (Signed)
History and Physical    Whitney [name] NFA:[id number] DOB: 1928-01-05 DOA: 10/24/2019  PCP: [name], MD   Patient coming from: Home   Chief Complaint: Rectal bleeding   HPI: Whitney [name] is a 86 y.o. female with medical history significant for paroxysmal atrial fibrillation on Eliquis, breast cancer, type 2 diabetes mellitus, chronic diastolic CHF, and hypertension, now presenting to the emergency department for evaluation of rectal bleeding.  The patient reports that for the past week, she has had recurrent episodes of rectal bleeding without any abdominal pain, nausea, or vomiting.  Patient reports that she feels a sudden urge to defecate, sometimes waking her from sleep, and then sees red and maroon blood in the commode.  This is happened several times over the past week, her daughter witnessed an episode overnight that appeared to involve a large volume of blood, and the patient was brought into the ED for evaluation.  ED Course: Upon arrival to the ED, patient is found to be afebrile, saturating well on room air, with normal heart rate, and stable blood pressure.  Chemistry panel is notable for creatinine of 1.41, similar to priors.  CBC features a hemoglobin of 7.0, down from 9.4 in June 2021.  Fecal occult blood testing is positive.  COVID-19 screening test not yet collected.  Review of Systems:  All other systems reviewed and apart from HPI, are negative.  Past Medical History:  Diagnosis Date  . Anemia   . Atrial fibrillation ([place])   . Breast cancer ([place]) 10/2013   right upper outer  . Cancer ([place])    right breast  . Complication of anesthesia    slow to wake up  . Diabetes mellitus without complication ([place])   . Dysrhythmia 10/15   AF  . Former smoker   . Full dentures   . Hyperlipidemia   . Hypertension   . Multinodular goiter   . Osteoporosis   . Radiation    Right Breast  . Thyroid disease    hypothyroidism  . Wears glasses     Past Surgical  History:  Procedure Laterality Date  . ABDOMINAL HYSTERECTOMY    . AXILLARY LYMPH NODE DISSECTION Right 01/09/2014   Procedure: RIGHT AXILLARY LYMPH NODE DISECTION;  Surgeon: [name], MD;  Location: [facility];  Service: General;  Laterality: Right;  . BREAST LUMPECTOMY WITH RADIOACTIVE SEED LOCALIZATION Right 01/09/2014   Procedure: RIGHT BREAST SEED LOCALIZED LUMPECTOMY ;  Surgeon: [name], MD;  Location: [facility];  Service: General;  Laterality: Right;  . EYE SURGERY  12/22/2009   cataracts  . INTRAMEDULLARY (IM) NAIL INTERTROCHANTERIC Left 02/24/2014   Procedure: IM ROD LEFT HIP FX;  Surgeon: Alta Corning, MD;  Location: [facility];  Service: Orthopedics;  Laterality: Left;  . left distal radius fracture     2020  . PORT-A-CATH REMOVAL Right 01/09/2014   Procedure: REMOVAL PORT-A-CATH;  Surgeon: [name], MD;  Location: [facility];  Service: General;  Laterality: Right;  . PORTACATH PLACEMENT N/A 11/15/2013   Procedure: INSERTION PORT-A-CATH WITH ULTRA SOUND AND FLOROSCOPY;  Surgeon: [name], MD;  Location: [facility];  Service: General;  Laterality: N/A;  . TOTAL KNEE ARTHROPLASTY  2002   left    Social History:   reports that she quit smoking about 60 years ago. She has a 5.00 pack-year smoking history. She has never used smokeless tobacco. She reports that she does not drink alcohol and does not  use drugs.  Allergies  Allergen Reactions  . Tape Other (See Comments)    Skin is somewhat sensitive    History reviewed. No pertinent family history.   Prior to Admission medications   Medication Sig Start Date End Date Taking? Authorizing Provider  amiodarone (PACERONE) 200 MG tablet Take 1 tablet (200 mg total) by mouth daily. 01/02/19  Yes [name], MD  atorvastatin (LIPITOR) 20 MG tablet Take 1 tablet (20 mg total) by mouth daily. Patient taking differently: Take 10 mg by mouth daily.  07/14/18  Yes  [name], MD  cholecalciferol (VITAMIN D3) 25 MCG (1000 UT) tablet Take 1 tablet (1,000 Units total) by mouth daily. 10/13/18  Yes [name], MD  doxazosin (CARDURA) 4 MG tablet TAKE 1 TABLET BY MOUTH EVERY DAY Patient taking differently: Take 2 mg by mouth daily.  09/28/19  Yes Bates, Crystal A, FNP  ELIQUIS 5 MG TABS tablet TAKE 1 TABLET BY MOUTH TWICE A DAY 08/15/19  Yes [name], MD  ferrous sulfate 324 (65 Fe) MG TBEC Take 1 tablet (325 mg total) by mouth in the morning and at bedtime. 07/31/19  Yes [name], MD  furosemide (LASIX) 40 MG tablet TAKE 2 TABLETS BY MOUTH TWICE A DAY Patient taking differently: Take 80 mg by mouth 2 (two) times daily.  08/31/19  Yes [name], MD  lisinopril (ZESTRIL) 20 MG tablet TAKE 1 TABLET BY MOUTH EVERY DAY 10/09/19  Yes [name], MD  metoprolol succinate (TOPROL-XL) 100 MG 24 hr tablet TAKE 1 TABLET BY MOUTH TWICE A DAY WITH OR IMMEDIATELY FOLLOWING A MEAL Patient taking differently: Take 100 mg by mouth in the morning and at bedtime. TAKE 1 TABLET BY MOUTH TWICE A DAY WITH OR IMMEDIATELY FOLLOWING A MEAL 05/11/19  Yes Elayne Snare, MD  sitaGLIPtin (JANUVIA) 100 MG tablet Take 0.5 tablets (50 mg total) by mouth daily. Take 1/2 tablet (52m total) by mouth once daily. 08/22/19  Yes PSusy Frizzle MD  tamoxifen (NOLVADEX) 20 MG tablet Take 1 tablet (20 mg total) by mouth daily. 05/26/19  Yes GNicholas Lose MD  Blood Glucose Monitoring Suppl (ONETOUCH VERIO) w/Device KIT Use to test blood sugars daily. 11/23/17   KElayne Snare MD  Calcium Carbonate-Vit D-Min (CALCIUM 1200) 1200-1000 MG-UNIT CHEW Chew 1,200 mg by mouth daily. 10/14/18   PSusy Frizzle MD  Carboxymeth-Glycerin-Polysorb (REFRESH OPTIVE ADVANCED OP) Place 1 drop into both eyes 3 (three) times daily.    [provider]  HYDROcodone-acetaminophen (NORCO/VICODIN) 5-325 MG tablet Take 1 tablet by mouth every 4 (four) hours as needed. 07/05/18   BMaudie Flakes MD  KLOR-CON M20 20 MEQ tablet TAKE 1 TABLET BY MOUTH EVERY DAY 09/14/19   KElayne Snare MD  meclizine (ANTIVERT) 12.5 MG tablet TAKE 1 TABLET BY MOUTH DAILY AS NEEDED FOR DIZZINESS Patient taking differently: Take 12.5 mg by mouth daily as needed for dizziness. TAKE 1 TABLET BY MOUTH DAILY AS NEEDED FOR DIZZINESS 08/04/18   PSusy Frizzle MD  metolazone (ZAROXOLYN) 5 MG tablet Take 1 tablet (5 mg total) by mouth daily. 08/09/18   PSusy Frizzle MD  ONETOUCH VERIO test strip CHECK BLOOD SUGAR EVERY DAY AS DIRECTED 06/15/19   KElayne Snare MD  oxyCODONE (ROXICODONE) 5 MG immediate release tablet Take 1 tablet (5 mg total) by mouth every 6 (six) hours as needed for severe pain (stop tramadol). 07/05/18   PSusy Frizzle MD  potassium  chloride (K-DUR) 10 MEQ tablet Take 1 tablet (10 mEq total) by mouth daily. 04/21/18   [name], MD  Sennosides (EX-LAX) 15 MG TABS Take 1 tablet by mouth daily as needed (CONSTIPATION).    [provider]  traMADol (ULTRAM) 50 MG tablet Take 1 tablet (50 mg total) by mouth every 8 (eight) hours as needed. 06/07/18   [name], MD    Physical Exam: Vitals:   10/24/19 0456 10/24/19 0457  BP: 123/75   Pulse: 86   Resp: 16   Temp: 97.8 F (36.6 C)   TempSrc: Oral   SpO2: 100%   Weight:  73.9 kg  Height:  5' 6"  (1.676 m)    Constitutional: NAD, calm  Eyes: PERTLA, lids and conjunctivae normal ENMT: Mucous membranes are moist. Posterior pharynx clear of any exudate or lesions.   Neck: normal, supple, no masses, no thyromegaly Respiratory:  no wheezing, no crackles. No accessory muscle use.  Cardiovascular: S1 & S2 heard, regular rate and rhythm. Pretibial pitting edema bilaterally.  Abdomen: No distension, no tenderness, soft. Bowel sounds active.  Musculoskeletal: no clubbing / cyanosis. No joint deformity upper and lower extremities.   Skin: no significant rashes, lesions, ulcers. Warm, dry, well-perfused. Neurologic: CN  2-12 grossly intact. Sensation intact. Moving all extremities.    Psychiatric: Alert and oriented to person, place, and situation. Very pleasant and cooperative.    Labs and Imaging on Admission: I have personally reviewed following labs and imaging studies  CBC: Recent Labs  Lab 10/24/19 0508  WBC 3.4*  NEUTROABS 2.6  HGB 7.0*  HCT 22.0*  MCV 104.8*  PLT 916*   Basic Metabolic Panel: Recent Labs  Lab 10/24/19 0508  NA 141  K 3.7  CL 107  CO2 26  GLUCOSE 190*  BUN 27*  CREATININE 1.41*  CALCIUM 8.0*   GFR: Estimated Creatinine Clearance: 27.8 mL/min (A) (by C-G formula based on SCr of 1.41 mg/dL (H)). Liver Function Tests: Recent Labs  Lab 10/24/19 0508  AST 34  ALT 25  ALKPHOS 25*  BILITOT 0.5  PROT 5.2*  ALBUMIN 2.7*   No results for input(s): LIPASE, AMYLASE in the last 168 hours. No results for input(s): AMMONIA in the last 168 hours. Coagulation Profile: No results for input(s): INR, PROTIME in the last 168 hours. Cardiac Enzymes: No results for input(s): CKTOTAL, CKMB, CKMBINDEX, TROPONINI in the last 168 hours. BNP (last 3 results) No results for input(s): PROBNP in the last 8760 hours. HbA1C: No results for input(s): HGBA1C in the last 72 hours. CBG: No results for input(s): GLUCAP in the last 168 hours. Lipid Profile: No results for input(s): CHOL, HDL, LDLCALC, TRIG, CHOLHDL, LDLDIRECT in the last 72 hours. Thyroid Function Tests: No results for input(s): TSH, T4TOTAL, FREET4, T3FREE, THYROIDAB in the last 72 hours. Anemia Panel: No results for input(s): VITAMINB12, FOLATE, FERRITIN, TIBC, IRON, RETICCTPCT in the last 72 hours. Urine analysis:    Component Value Date/Time   COLORURINE STRAW (A) 07/05/2018 1945   APPEARANCEUR CLEAR 07/05/2018 1945   LABSPEC 1.006 07/05/2018 1945   PHURINE 7.0 07/05/2018 1945   GLUCOSEU NEGATIVE 07/05/2018 1945   GLUCOSEU NEGATIVE 11/24/2012 0831   HGBUR NEGATIVE 07/05/2018 1945   BILIRUBINUR NEGATIVE  07/05/2018 1945   BILIRUBINUR neg 09/19/2014 [place] 07/05/2018 1945   PROTEINUR NEGATIVE 07/05/2018 1945   UROBILINOGEN 0.2 09/19/2014 1605   UROBILINOGEN 0.2 02/24/2014 0024   NITRITE NEGATIVE 07/05/2018 1945   LEUKOCYTESUR NEGATIVE  07/05/2018 1945   Sepsis Labs: @LABRCNTIP (procalcitonin:4,lacticidven:4) )No results found for this or any previous visit (from the past 240 hour(s)).   Radiological Exams on Admission: No results found.  Assessment/Plan   1. Acute lower GI bleeding; anemia   - Presents with one week of painless hematochezia and is found to have Hgb of 7.0, down from 9.4 in June 2021  - She has been hemodynamically stable with no bleeding since arrival in ED; BUN is stable and there is no upper GI sxs  - She reports never undergoing endoscopy before, was noted to have sigmoid diverticulosis on remote CT   - Type and screen, transfuse 1 unit RBC, hold Eliquis, check post-transfusion H&H, consult GI    2. Paroxysmal atrial fibrillation  - CHADS-VASc at least 5 (age x2, gender, CHF, DM)  - Hold Eliquis for now, continue amiodarone and metoprolol as tolerated   3. CKD IIIa  - SCr is 1.41 on admission, up from 1.26 in June  - Hold diuretics and ACE-i initially, renally-dose medications, repeat chem panel tomorrow   4. Chronic diastolic CHF  - Appears compensated, monitor weight and I/Os    5. Type II DM  - A1c was 6.1% in May 2021  - Check CBGs and use a low-intensity SSI for now    6. History of breast cancer  - Continue Tamoxifen    DVT prophylaxis: Eliquis pta, SCDs for now  Code Status: Full  Family Communication: Discussed with patient  Disposition Plan:  Patient is from: Home with daughter  Anticipated d/c is to: TBD Anticipated d/c date is: Possibly as early as 10/25/19  Patient currently: Pending blood transfusion, post-transfusion H&H  Consults called: Message sent to GI for routine consult request, have not spoken with them yet as  patient felt to be stable for routine AM consult  Admission status: Observation     Vianne Bulls, MD [facility]  10/24/2019, 6:22 AM

## 2019-10-24 NOTE — Progress Notes (Signed)
Report received from [facility]. Awaiting Covid results.

## 2019-10-24 NOTE — Progress Notes (Signed)
PROGRESS NOTE    Patient: Whitney [name]                            PCP: [name], MD                    DOB: May 28, 1927            DOA: 10/24/2019 [facility]:[id number]             DOS: 10/24/2019, 7:26 AM   LOS: 0 days   Date of Service: The patient was seen and examined on 10/24/2019  Subjective:   The patient was seen and examined this Am.  Awake alert oriented --pleasant in no acute distress. Denies any chest pain shortness of breath.  Hemodynamically stable. Granddaughter present at bedside   Brief Narrative:   Per HPI:  Whitney [name] is a 86 y.o. female with medical history significant for paroxysmal atrial fibrillation on Eliquis, breast cancer, type 2 diabetes mellitus, chronic diastolic CHF, and hypertension, now presenting to the emergency department for evaluation of Rectal bleeding.  The patient reports that for the past week, she has had recurrent episodes of rectal bleeding without any abdominal pain, nausea, or vomiting.      ED Course: Hemodynamically stable Chemistry panel is notable for creatinine of 1.41, similar to priors.  CBC features a hemoglobin of 7.0, down from 9.4 in June 2021.  Fecal occult blood testing is positive.   COVID-19 screening negative.  Patient subsequently admitted, 1 unit of PRBC transfusion ordered.  GI has been messaged for consultation   Assessment & Plan:   Principal Problem:   Lower GI bleed Active Problems:   Essential hypertension, benign   Breast cancer of upper-outer quadrant of right female breast (HCC)   Chronic diastolic CHF (congestive heart failure), NYHA class 2 (HCC)   Controlled diabetes mellitus type 2 with complications ([facility])   Paroxysmal atrial fibrillation ([facility])   Chronic kidney disease, stage 3a    Acute lower GI bleeding; anemia   -Remained stable, hemoglobin 7.0 >> p 1 u PRBC transfusion >>>  -No signs of active bleeding  - Presents with one week of painless hematochezia and is found to  have Hgb of 7.0, down from 9.4 in June 2021  -Hemodynamically stable - She reports never undergoing endoscopy before, was noted to have sigmoid diverticulosis on remote CT    -GI consulted -Holding Eliquis -N.p.o. till GI evaluation -H&H every 6 hours       Paroxysmal atrial fibrillation  -Stable, mildly hypotensive, anticipating holding metoprolol - CHADS-VASc at least 101 (age x2, gender, CHF, DM)  - Hold Eliquis for now,  -On amiodarone and metoprolol as tolerated   CKD IIIa  - SCr is 1.41 on admission (up from 1.26 in June) >>   - Hold diuretics and ACE-i initially, renally-dose medications,     Chronic diastolic CHF  -Stable continue to monitor - Appears compensated, monitor weight and I/Os     Type II DM  -N.p.o. - A1c was 6.1% in May 2021  - Check CBGs and use a low-intensity SSI for now     History of breast cancer  - Continue Tamoxifen    DVT prophylaxis: Eliquis pta (on hold), SCDs for now  Code Status: Full  Family Communication: Discussed with patient, and her granddaughter at bedside Disposition Plan:  Patient is from: Home with granddaughter Anticipated d/c is to: TBD Anticipated d/c  date is: Possibly as early as 10/25/19  Patient currently: Pending blood transfusion, post-transfusion H&H   Consults called: Message sent to GI for routine consult request, have not spoken with them yet as patient felt to be stable for routine AM consult   Admission status: Observation     The patient remains OBS appropriate and will d/c before 2 midnights.  Dispo: The patient is from: Home              Anticipated d/c is to: Home              Anticipated d/c date is: 2 days              Patient currently is not medically stable to d/c.        Procedures:   No admission procedures for hospital encounter.     Antimicrobials:  Anti-infectives (From admission, onward)   None       Medication:  . denosumab  60 mg Subcutaneous Q6 months  .  insulin aspart  0-9 Units Subcutaneous Q4H       Objective:   Vitals:   10/24/19 0456 10/24/19 0457 10/24/19 0707  BP: 123/75  133/75  Pulse: 86  89  Resp: 16  16  Temp: 97.8 F (36.6 C)    TempSrc: Oral    SpO2: 100%  100%  Weight:  73.9 kg   Height:  5\' 6"  (1.676 m)    No intake or output data in the 24 hours ending 10/24/19 0726 Filed Weights   10/24/19 0457  Weight: 73.9 kg     Examination:   Physical Exam  Constitution:  Alert, cooperative, no distress,  Appears calm and comfortable  Psychiatric: Normal and stable mood and affect, cognition intact,   HEENT: Normocephalic, PERRL, otherwise with in Normal limits  Chest:Chest symmetric Cardio vascular:  S1/S2, RRR, No murmure, No Rubs or Gallops  pulmonary: Clear to auscultation bilaterally, respirations unlabored, negative wheezes / crackles Abdomen: Soft, non-tender, non-distended, bowel sounds,no masses, no organomegaly Muscular skeletal: Limited exam - in bed, able to move all 4 extremities, Normal strength,  Neuro: CNII-XII intact. , normal motor and sensation, reflexes intact  Extremities: No pitting edema lower extremities, +2 pulses  Skin: Dry, warm to touch, negative for any Rashes, No open wounds Wounds: per nursing documentation    ------------------------------------------------------------------------------------------------------------------------------------------    LABs:  CBC Latest Ref Rng & Units 10/24/2019 09/01/2019 07/19/2019  WBC 4.0 - 10.5 K/uL 3.4(L) 3.6(L) 4.2  Hemoglobin 12.0 - 15.0 g/dL 7.0(L) 9.4(L) 9.4(L)  Hematocrit 36 - 46 % 22.0(L) 28.9(L) 27.6(L)  Platelets 150 - 400 K/uL 131(L) 189 162.0   CMP Latest Ref Rng & Units 10/24/2019 09/01/2019 07/19/2019  Glucose 70 - 99 mg/dL 190(H) 186(H) 187(H)  BUN 8 - 23 mg/dL 27(H) 26(H) 32(H)  Creatinine 0.44 - 1.00 mg/dL 1.41(H) 1.26(H) 1.50(H)  Sodium 135 - 145 mmol/L 141 140 141  Potassium 3.5 - 5.1 mmol/L 3.7 3.8 3.6  Chloride 98 - 111  mmol/L 107 105 107  CO2 22 - 32 mmol/L 26 28 28   Calcium 8.9 - 10.3 mg/dL 8.0(L) 8.7 8.8  Total Protein 6.5 - 8.1 g/dL 5.2(L) - 5.9(L)  Total Bilirubin 0.3 - 1.2 mg/dL 0.5 - 0.5  Alkaline Phos 38 - 126 U/L 25(L) - 29(L)  AST 15 - 41 U/L 34 - 33  ALT 0 - 44 U/L 25 - 28       Micro Results No results found for this or  any previous visit (from the past 240 hour(s)).  Radiology Reports No results found.  SIGNED: Deatra James, MD, FACP, FHM. [facility],  Pager (please use [email or web address] to [email or web address])  If 7PM-7AM, please contact night-coverage [email or web address] [email or web address] [facility] 10/24/2019, 7:27 AM

## 2019-10-24 NOTE — Consult Note (Signed)
[facility] Consult: 10:48 AM 10/24/2019  LOS: 0 days    Referring Provider: Dr Jamesetta Geralds  Primary Care Physician:  [name], MD Primary Gastroenterologist:  unassigned     Reason for Consultation:  Hematochezia in setting of Eliquis   HPI: Whitney [name] is a 86 y.o. female.  PMH Diastolic CHF.  Pulmonary hypertension.  PAF, on long-term Eliquis.  CKD stage 3.  Breast cancer, s/p 2015 lumpectomy and radiation seed implant, treated with chemo and on chronic tamoxifen.  DM 2. High functioning at home.    No previous colonoscopy. Hx elevated LFTs in 12/2015, 10/2016 in setting of sepsis..  Hepatitis serologies negative.  Amiodarone and statins were held. 12/2015 CTAP with contrast showed mild thickening in proximal to mid sigmoid, could reflect mild diverticulitis.  Diffuse diverticulosis at proximal sigmoid.  Scattered, small hypodensities in the liver, possibly small cysts.  Large stone in gallbladder which is otherwise unremarkable.  Normal CBD.  Incidental findings included a left adrenal gland mass, diffuse aortic atherosclerosis. 10/2016 abdominal ultrasound.  Previous gallstone not visualized likely due to its being obscured by overlying bowel gas.  4.6 mm CBD.  1.2 cm hepatic cyst.  PMD initiated ferrous sulfate 325 mg bid in early May 2021 for IDA.  Iron and iron saturation levels were low.  Hb was 9.4 in early May and 9.4 in mid June.  Pt reported occasional blood in her stool.  However PMD felt risk-benefit ratio favors continuation of Eliquis.    Pt and her granddaughter described intermittent passing of small quantities of blood per rectum, these occur with and without bowel movements and have been occurring since she started Eliquis a few years back. For about a week she is been passing  larger quantities of dark, watery, bloody material.  She has become weak, drowsy.  Early this morning she called her granddaughter [place] and her speech was slurred and she felt very drowsy and sluggish, she was brought to the ED.  Pt denies abdominal pain, rectal pain, nausea, vomiting, dysphagia, heartburn.  Appetite is great.  She has chronic accelerated heart rate when she stands up, this is no worse now.      RN on floor reports pt's had ~ 3 bloody stools including bleeding I observed during exam.  Getting PRBCs now. Hgb today 7, MCV 104.  Platelets 131.  Slight AKI but stable BUN, today it is 27, 3 weeks ago it was 26. COVID-19 negative.  Subsequent iron studies including ferritin in mid June and today show resolution of iron deficiency.  Lives with her granddaughter Francesco Runner [phone]  Past Medical History:  Diagnosis Date  . Anemia   . Atrial fibrillation ([place])   . Breast cancer ([place]) 10/2013   right upper outer  . Cancer ([place])    right breast  . Complication of anesthesia    slow to wake up  . Diabetes mellitus without complication ([place])   . Dysrhythmia 10/15   AF  . Former smoker   . Full dentures   . Hyperlipidemia   .  Hypertension   . Multinodular goiter   . Osteoporosis   . Radiation    Right Breast  . Thyroid disease    hypothyroidism  . Wears glasses     Past Surgical History:  Procedure Laterality Date  . ABDOMINAL HYSTERECTOMY    . AXILLARY LYMPH NODE DISSECTION Right 01/09/2014   Procedure: RIGHT AXILLARY LYMPH NODE DISECTION;  Surgeon: [name], MD;  Location: [facility];  Service: General;  Laterality: Right;  . BREAST LUMPECTOMY WITH RADIOACTIVE SEED LOCALIZATION Right 01/09/2014   Procedure: RIGHT BREAST SEED LOCALIZED LUMPECTOMY ;  Surgeon: [name], MD;  Location: [facility];  Service: General;  Laterality: Right;  . EYE SURGERY  12/22/2009   cataracts  . INTRAMEDULLARY (IM) NAIL INTERTROCHANTERIC  Left 02/24/2014   Procedure: IM ROD LEFT HIP FX;  Surgeon: Alta Corning, MD;  Location: [facility];  Service: Orthopedics;  Laterality: Left;  . left distal radius fracture     2020  . PORT-A-CATH REMOVAL Right 01/09/2014   Procedure: REMOVAL PORT-A-CATH;  Surgeon: [name], MD;  Location: [facility];  Service: General;  Laterality: Right;  . PORTACATH PLACEMENT N/A 11/15/2013   Procedure: INSERTION PORT-A-CATH WITH ULTRA SOUND AND FLOROSCOPY;  Surgeon: [name], MD;  Location: [facility];  Service: General;  Laterality: N/A;  . TOTAL KNEE ARTHROPLASTY  2002   left    Prior to Admission medications   Medication Sig Start Date End Date Taking? Authorizing Provider  amiodarone (PACERONE) 200 MG tablet Take 1 tablet (200 mg total) by mouth daily. 01/02/19  Yes [name], MD  atorvastatin (LIPITOR) 20 MG tablet Take 1 tablet (20 mg total) by mouth daily. Patient taking differently: Take 10 mg by mouth daily.  07/14/18  Yes [name], MD  cholecalciferol (VITAMIN D3) 25 MCG (1000 UT) tablet Take 1 tablet (1,000 Units total) by mouth daily. 10/13/18  Yes [name], MD  doxazosin (CARDURA) 4 MG tablet TAKE 1 TABLET BY MOUTH EVERY DAY Patient taking differently: Take 2 mg by mouth daily.  09/28/19  Yes Bates, Crystal A, FNP  ELIQUIS 5 MG TABS tablet TAKE 1 TABLET BY MOUTH TWICE A DAY Patient taking differently: Take 5 mg by mouth 2 (two) times daily.  08/15/19  Yes [name], MD  ferrous sulfate 324 (65 Fe) MG TBEC Take 1 tablet (325 mg total) by mouth in the morning and at bedtime. 07/31/19  Yes [name], MD  furosemide (LASIX) 40 MG tablet TAKE 2 TABLETS BY MOUTH TWICE A DAY Patient taking differently: Take 80 mg by mouth 2 (two) times daily.  08/31/19  Yes [name], MD  lisinopril (ZESTRIL) 20 MG tablet TAKE 1 TABLET BY MOUTH EVERY DAY Patient taking differently: Take 20 mg by mouth daily.  10/09/19  Yes [name], MD  meclizine  (ANTIVERT) 12.5 MG tablet TAKE 1 TABLET BY MOUTH DAILY AS NEEDED FOR DIZZINESS Patient taking differently: Take 12.5 mg by mouth daily as needed for dizziness. TAKE 1 TABLET BY MOUTH DAILY AS NEEDED FOR DIZZINESS 08/04/18  Yes [name], MD  metoprolol succinate (TOPROL-XL) 100 MG 24 hr tablet TAKE 1 TABLET BY MOUTH TWICE A DAY WITH OR IMMEDIATELY FOLLOWING A MEAL Patient taking differently: Take 100 mg by mouth in the morning and at bedtime. TAKE 1 TABLET BY MOUTH TWICE A DAY WITH OR IMMEDIATELY FOLLOWING A MEAL 05/11/19  Yes Elayne Snare, MD  sitaGLIPtin [facility])  100 MG tablet Take 0.5 tablets (50 mg total) by mouth daily. Take 1/2 tablet (55m total) by mouth once daily. 08/22/19  Yes PSusy Frizzle MD  tamoxifen (NOLVADEX) 20 MG tablet Take 1 tablet (20 mg total) by mouth daily. 05/26/19  Yes GNicholas Lose MD  Blood Glucose Monitoring Suppl (ONETOUCH VERIO) w/Device KIT Use to test blood sugars daily. 11/23/17   KElayne Snare MD  Calcium Carbonate-Vit D-Min (CALCIUM 1200) 1200-1000 MG-UNIT CHEW Chew 1,200 mg by mouth daily. Patient not taking: Reported on 10/24/2019 10/14/18   PSusy Frizzle MD  HYDROcodone-acetaminophen (NORCO/VICODIN) 5-325 MG tablet Take 1 tablet by mouth every 4 (four) hours as needed. Patient not taking: Reported on 10/24/2019 07/05/18   BMaudie Flakes MD  KLOR-CON M20 20 MEQ tablet TAKE 1 TABLET BY MOUTH EVERY DAY Patient not taking: Reported on 10/24/2019 09/14/19   KElayne Snare MD  metolazone (ZAROXOLYN) 5 MG tablet Take 1 tablet (5 mg total) by mouth daily. Patient not taking: Reported on 10/24/2019 08/09/18   PSusy Frizzle MD  [facility] test strip CHECK BLOOD SUGAR EVERY DAY AS DIRECTED 06/15/19   KElayne Snare MD  oxyCODONE (ROXICODONE) 5 MG immediate release tablet Take 1 tablet (5 mg total) by mouth every 6 (six) hours as needed for severe pain (stop tramadol). Patient not taking: Reported on 10/24/2019 07/05/18   PSusy Frizzle MD  potassium chloride  (K-DUR) 10 MEQ tablet Take 1 tablet (10 mEq total) by mouth daily. Patient not taking: Reported on 10/24/2019 04/21/18   PSusy Frizzle MD  traMADol (ULTRAM) 50 MG tablet Take 1 tablet (50 mg total) by mouth every 8 (eight) hours as needed. Patient not taking: Reported on 10/24/2019 06/07/18   PSusy Frizzle MD    Scheduled Meds: . amiodarone  200 mg Oral Daily  . atorvastatin  10 mg Oral Daily  . doxazosin  2 mg Oral Daily  . insulin aspart  0-9 Units Subcutaneous Q4H  . metoprolol succinate  100 mg Oral Daily  . sodium chloride flush  3 mL Intravenous Q12H  . tamoxifen  20 mg Oral Daily   Infusions: . sodium chloride     PRN Meds: sodium chloride, ondansetron **OR** ondansetron (ZOFRAN) IV, sodium chloride flush   Allergies as of 10/24/2019 - Review Complete 10/24/2019  Allergen Reaction Noted  . Tape Other (See Comments) 12/31/2015    History reviewed. No pertinent family history.  Social History   Socioeconomic History  . Marital status: Widowed    Spouse name: Not on file  . Number of children: 4  . Years of education: Not on file  . Highest education level: Not on file  Occupational History  . Not on file  Tobacco Use  . Smoking status: Former Smoker    Packs/day: 1.00    Years: 5.00    Pack years: 5.00    Quit date: 11/14/1958    Years since quitting: 60.9  . Smokeless tobacco: Never Used  Vaping Use  . Vaping Use: Never used  Substance and Sexual Activity  . Alcohol use: No    Alcohol/week: 0.0 standard drinks  . Drug use: No  . Sexual activity: Never    Comment: menarche age 86 PP47 first birth age 86 no HRT, menopause age 86 Other Topics Concern  . Not on file  Social History Narrative  . Not on file   Social Determinants of Health   Financial Resource Strain:   . Difficulty of Paying  Living Expenses:   Food Insecurity:   . Worried About Charity fundraiser in the Last Year:   . Arboriculturist in the Last Year:   Transportation Needs:    . Film/video editor (Medical):   Marland Kitchen Lack of Transportation (Non-Medical):   Physical Activity:   . Days of Exercise per Week:   . Minutes of Exercise per Session:   Stress:   . Feeling of Stress :   Social Connections:   . Frequency of Communication with Friends and Family:   . Frequency of Social Gatherings with Friends and Family:   . Attends Religious Services:   . Active Member of Clubs or Organizations:   . Attends Archivist Meetings:   Marland Kitchen Marital Status:   Intimate Partner Violence:   . Fear of Current or Ex-Partner:   . Emotionally Abused:   Marland Kitchen Physically Abused:   . Sexually Abused:     REVIEW OF SYSTEMS: Constitutional: See HPI. ENT:  No nose bleeds.  Hard of hearing.  Left her hearing aids at home.  The bulk of her teeth are absent.  She lost her dentures. Pulm: Denies shortness of breath and cough. CV: Stable tachycardia when she stands up.  Stable, chronic lower extremity edema. GU:  No hematuria, no frequency GI: See HPI Heme: Other than the rectal bleeding, reports no unusual or excessive bleeding or bruising Transfusions: None. Neuro:  No headaches, no peripheral tingling or numbness.  No presyncope.  No seizures. Derm:  No itching, no rash or sores.  Endocrine:  No sweats or chills.  No polyuria or dysuria Immunization: Has received 2 series COVID-19 vaccination. Travel:  None beyond local counties in last few months.    PHYSICAL EXAM: Vital signs in last 24 hours: Vitals:   10/24/19 0858 10/24/19 1031  BP: 119/71 114/78  Pulse: 94 81  Resp: 20 18  Temp: 97.8 F (36.6 C) 97.6 F (36.4 C)  SpO2: 100% 99%   Wt Readings from Last 3 Encounters:  10/24/19 74.2 kg  09/01/19 73.9 kg  07/31/19 76.2 kg    General: Very pleasant elderly but looks great for age 64.  Alert.  Comfortable Head: No facial asymmetry or swelling.  No signs of head trauma. Eyes: No scleral icterus.  No conjunctival pallor.  EOMI Ears: Hard of hearing Nose: No  discharge or congestion Mouth: About 6 or 7 lower incisors remain otherwise edentulous.  Mucosa is moist, pink, clear.  Tongue midline Neck: No JVD, no masses, no thyromegaly Lungs: Clear bilaterally with good breath sounds.  No labored breathing, no cough Heart: RRR.  No MRG.  S1, S2 present Abdomen: Soft without masses.  No HSM, bruits, hernias.  Active bowel sounds.  No distention..   Rectal: Deferred.  There was pooled watery, burgundy colored blood at her groin. Musc/Skeltl: No joint redness, swelling or gross deformity. Extremities: No CCE. Neurologic: Oriented x3,.  No tremors or weakness.  Moves all 4 limbs. Tattoos: None Nodes: No cervical adenopathy Psych: Pleasant, calm, cooperative.  Fluid speech.  Intake/Output from previous day: No intake/output data recorded. Intake/Output this shift: No intake/output data recorded.  LAB RESULTS: Recent Labs    10/24/19 0508  WBC 3.4*  HGB 7.0*  HCT 22.0*  PLT 131*   BMET Lab Results  Component Value Date   NA 141 10/24/2019   NA 140 09/01/2019   NA 141 07/19/2019   K 3.7 10/24/2019   K 3.8 09/01/2019   K  3.6 07/19/2019   CL 107 10/24/2019   CL 105 09/01/2019   CL 107 07/19/2019   CO2 26 10/24/2019   CO2 28 09/01/2019   CO2 28 07/19/2019   GLUCOSE 190 (H) 10/24/2019   GLUCOSE 186 (H) 09/01/2019   GLUCOSE 187 (H) 07/19/2019   BUN 27 (H) 10/24/2019   BUN 26 (H) 09/01/2019   BUN 32 (H) 07/19/2019   CREATININE 1.41 (H) 10/24/2019   CREATININE 1.26 (H) 09/01/2019   CREATININE 1.50 (H) 07/19/2019   CALCIUM 8.0 (L) 10/24/2019   CALCIUM 8.7 09/01/2019   CALCIUM 8.8 07/19/2019   LFT Recent Labs    10/24/19 0508  PROT 5.2*  ALBUMIN 2.7*  AST 34  ALT 25  ALKPHOS 25*  BILITOT 0.5   PT/INR Lab Results  Component Value Date   INR 2.64 10/25/2016   INR 2.22 10/23/2016   INR 1.50 (H) 02/25/2014    RADIOLOGY STUDIES: No results found.   IMPRESSION:   *    Hematochezia. No previous colonoscopy (or  EGD) Rule out diverticular bleed versus neoplasia vs AVMs.   First of 2 PRBCs infusing now.    *    Acute on chronic anemia.  Currently macrocytic.  Was iron deficient in early May, iron deficiency resolved by repeat labs in June and today. On chronic BID po iron since early 07/2019.   B12, folate levels normal.  *    Thrombocytopenia.  *    PAF.  Chronic long-term Eliquis.  On hold.  Last dose PM 8/9   PLAN:     *   Colonoscopy?,  Clinically looks like she would tolerate prep and procedure with sedation without high risk. Also consider nuclear medicine bleeding scan.  *   Allow clears.    *   Transfuse blood, as doing.     Azucena Freed  10/24/2019, 10:48 AM Phone [phone]

## 2019-10-24 NOTE — ED Notes (Signed)
ED Provider at bedside. 

## 2019-10-24 NOTE — ED Provider Notes (Signed)
[facility] DEPT Provider Note   CSN: [id number] Arrival date & time: 10/24/19  0413     History Chief Complaint  Patient presents with  . Rectal Bleeding    Patient states bloody stools that started last week.    Whitney [name] is a 86 y.o. female.  Patient from home where she lives with her daughter for evaluation of melena and BRB with clots from rectum for 3 days. The patient denies abdominal pain, nausea. She is not lightheaded when she stands or walks. No recent falls. No history of GI bleeding in the past. She is anticoagulated with Eliquis secondary to A-fib. No chest pain, SOB. No urinary symptoms. She states her stools have been dark black for a long time but the BRB and clotting is new.   The history is provided by the patient and the EMS personnel.  Rectal Bleeding Associated symptoms: no abdominal pain, no fever, no light-headedness and no vomiting        Past Medical History:  Diagnosis Date  . Anemia   . Atrial fibrillation ([place])   . Breast cancer ([place]) 10/2013   right upper outer  . Cancer ([place])    right breast  . Complication of anesthesia    slow to wake up  . Diabetes mellitus without complication ([place])   . Dysrhythmia 10/15   AF  . Former smoker   . Full dentures   . Hyperlipidemia   . Hypertension   . Multinodular goiter   . Osteoporosis   . Radiation    Right Breast  . Thyroid disease    hypothyroidism  . Wears glasses     Patient Active Problem List   Diagnosis Date Noted  . Osteoporosis   . Unilateral primary osteoarthritis, right knee 03/25/2018  . Chronic pain of right knee 05/25/2017  . Bacteremia 10/25/2016  . Transaminitis 10/23/2016  . Sinus bradycardia 10/23/2016  . Controlled diabetes mellitus type 2 with complications ([place])   . Paroxysmal atrial fibrillation (H[facility])   . Chronic diastolic CHF (congestive heart failure) ([place])   . Pulmonary hypertension ([place])   . Acute renal failure  superimposed on stage 2 chronic kidney disease ([place])   . Elevated liver enzymes   . Bacteremia due to Escherichia coli 12/30/2015  . UTI (urinary tract infection) 12/30/2015  . Acute blood loss anemia 02/25/2014  . Closed left hip fracture ([place]) 02/24/2014  . PAF (paroxysmal atrial fibrillation) ([place]) 01/30/2014  . Anticoagulated 01/30/2014  . Chronic diastolic CHF (congestive heart failure), NYHA class 2 ([place]) 12/10/2013  . Preoperative cardiovascular examination 12/10/2013  . Neutropenic fever ([place]) 12/08/2013  . Anemia of chronic disease 12/08/2013  . Thrombocytopenia ([place]) 12/08/2013  . Hypokalemia 12/08/2013  . Pancytopenia due to antineoplastic chemotherapy ([place]) 12/08/2013  . Hypothyroidism 12/08/2013  . Breast cancer of upper-outer quadrant of right female breast ([place]) 11/01/2013  . Essential hypertension, benign 12/01/2012  . Pure hypercholesterolemia 12/01/2012  . DM2 (diabetes mellitus, type 2) ([place]) 11/21/2012    Past Surgical History:  Procedure Laterality Date  . ABDOMINAL HYSTERECTOMY    . AXILLARY LYMPH NODE DISSECTION Right 01/09/2014   Procedure: RIGHT AXILLARY LYMPH NODE DISECTION;  Surgeon: [name], MD;  Location: [facility];  Service: General;  Laterality: Right;  . BREAST LUMPECTOMY WITH RADIOACTIVE SEED LOCALIZATION Right 01/09/2014   Procedure: RIGHT BREAST SEED LOCALIZED LUMPECTOMY ;  Surgeon: [name], MD;  Location: [facility];  Service: General;  Laterality: Right;  .  EYE SURGERY  12/22/2009   cataracts  . INTRAMEDULLARY (IM) NAIL INTERTROCHANTERIC Left 02/24/2014   Procedure: IM ROD LEFT HIP FX;  Surgeon: Alta Corning, MD;  Location: [facility];  Service: Orthopedics;  Laterality: Left;  . left distal radius fracture     2020  . PORT-A-CATH REMOVAL Right 01/09/2014   Procedure: REMOVAL PORT-A-CATH;  Surgeon: [name], MD;  Location: [facility];  Service: General;  Laterality: Right;  .  PORTACATH PLACEMENT N/A 11/15/2013   Procedure: INSERTION PORT-A-CATH WITH ULTRA SOUND AND FLOROSCOPY;  Surgeon: [name], MD;  Location: [facility];  Service: General;  Laterality: N/A;  . TOTAL KNEE ARTHROPLASTY  2002   left     OB History   No obstetric history on file.     No family history on file.  Social History   Tobacco Use  . Smoking status: Former Smoker    Packs/day: 1.00    Years: 5.00    Pack years: 5.00    Quit date: 11/14/1958    Years since quitting: 60.9  . Smokeless tobacco: Never Used  Vaping Use  . Vaping Use: Never used  Substance Use Topics  . Alcohol use: No    Alcohol/week: 0.0 standard drinks  . Drug use: No    Home Medications Prior to Admission medications   Medication Sig Start Date End Date Taking? Authorizing Provider  amiodarone (PACERONE) 200 MG tablet Take 1 tablet (200 mg total) by mouth daily. 01/02/19   [name], MD  atorvastatin (LIPITOR) 20 MG tablet Take 1 tablet (20 mg total) by mouth daily. 07/14/18   [name], MD  Blood Glucose Monitoring Suppl (ONETOUCH VERIO) w/Device KIT Use to test blood sugars daily. 11/23/17   Elayne Snare, MD  Calcium Carbonate-Vit D-Min (CALCIUM 1200) 1200-1000 MG-UNIT CHEW Chew 1,200 mg by mouth daily. 10/14/18   [name], MD  Carboxymeth-Glycerin-Polysorb (REFRESH OPTIVE ADVANCED OP) Place 1 drop into both eyes 3 (three) times daily.    [provider]  cholecalciferol (VITAMIN D3) 25 MCG (1000 UT) tablet Take 1 tablet (1,000 Units total) by mouth daily. 10/13/18   [name], MD  doxazosin (CARDURA) 4 MG tablet TAKE 1 TABLET BY MOUTH EVERY DAY 09/28/19   Ishmael Holter A, FNP  ELIQUIS 5 MG TABS tablet TAKE 1 TABLET BY MOUTH TWICE A DAY 08/15/19   [name], MD  ferrous sulfate 324 (65 Fe) MG TBEC Take 1 tablet (325 mg total) by mouth in the morning and at bedtime. 07/31/19   [name], MD  furosemide (LASIX) 40 MG tablet TAKE 2 TABLETS BY MOUTH TWICE A DAY  08/31/19   [name], MD  HYDROcodone-acetaminophen (NORCO/VICODIN) 5-325 MG tablet Take 1 tablet by mouth every 4 (four) hours as needed. 07/05/18   Maudie Flakes, MD  KLOR-CON M20 20 MEQ tablet TAKE 1 TABLET BY MOUTH EVERY DAY 09/14/19   Elayne Snare, MD  lisinopril (ZESTRIL) 20 MG tablet TAKE 1 TABLET BY MOUTH EVERY DAY 10/09/19   [name], MD  meclizine (ANTIVERT) 12.5 MG tablet TAKE 1 TABLET BY MOUTH DAILY AS NEEDED FOR DIZZINESS 08/04/18   [name], MD  metolazone (ZAROXOLYN) 5 MG tablet Take 1 tablet (5 mg total) by mouth daily. 08/09/18   [name], MD  metoprolol succinate (TOPROL-XL) 100 MG 24 hr tablet TAKE 1 TABLET BY MOUTH TWICE A DAY WITH OR IMMEDIATELY FOLLOWING A MEAL 05/11/19  Elayne Snare, MD  [facility] VERIO test strip CHECK BLOOD SUGAR EVERY DAY AS DIRECTED 06/15/19   Elayne Snare, MD  oxyCODONE (ROXICODONE) 5 MG immediate release tablet Take 1 tablet (5 mg total) by mouth every 6 (six) hours as needed for severe pain (stop tramadol). 07/05/18   [name], MD  potassium chloride (K-DUR) 10 MEQ tablet Take 1 tablet (10 mEq total) by mouth daily. 04/21/18   [name], MD  Sennosides (EX-LAX) 15 MG TABS Take 1 tablet by mouth daily as needed (CONSTIPATION).    [provider]  sitaGLIPtin (JANUVIA) 100 MG tablet Take 0.5 tablets (50 mg total) by mouth daily. Take 1/2 tablet (2m total) by mouth once daily. 08/22/19   PSusy Frizzle MD  tamoxifen (NOLVADEX) 20 MG tablet Take 1 tablet (20 mg total) by mouth daily. 05/26/19   GNicholas Lose MD  traMADol (ULTRAM) 50 MG tablet Take 1 tablet (50 mg total) by mouth every 8 (eight) hours as needed. 06/07/18   PSusy Frizzle MD    Allergies    Tape  Review of Systems   Review of Systems  Constitutional: Negative for chills and fever.  HENT: Negative.   Respiratory: Negative.   Cardiovascular: Negative.   Gastrointestinal: Positive for blood in stool and hematochezia. Negative for  abdominal pain, diarrhea and vomiting.  Genitourinary: Negative.  Negative for dysuria and vaginal bleeding.  Musculoskeletal: Negative.  Negative for back pain.  Skin: Negative.   Neurological: Negative.  Negative for syncope, weakness and light-headedness.    Physical Exam Updated Vital Signs BP 123/75 (BP Location: Right Arm)   Pulse 86   Temp 97.8 F (36.6 C) (Oral)   Resp 16   Ht _0  (1.676 m)   Wt 73.9 kg   SpO2 100%   BMI 26.31 kg/m   Physical Exam Vitals and nursing note reviewed.  Constitutional:      Appearance: She is well-developed.  HENT:     Head: Normocephalic.  Eyes:     Comments: Conjunctival pallor  Cardiovascular:     Rate and Rhythm: Normal rate and regular rhythm.  Pulmonary:     Effort: Pulmonary effort is normal.     Breath sounds: Normal breath sounds. No wheezing, rhonchi or rales.  Abdominal:     General: Bowel sounds are normal. There is no distension.     Palpations: Abdomen is soft.     Tenderness: There is no abdominal tenderness. There is no guarding or rebound.  Genitourinary:    Rectum: Guaiac result positive.     Comments: Minimal stool in rectal vault. Large external hemorrhoid without visualized bleeding. No vaginal bleeding visualized.  Musculoskeletal:        General: Normal range of motion.     Cervical back: Normal range of motion and neck supple.  Skin:    General: Skin is warm and dry.     Findings: No rash.  Neurological:     Mental Status: She is alert and oriented to person, place, and time.     ED Results / Procedures / Treatments   Labs (all labs ordered are listed, but only abnormal results are displayed) Labs Reviewed  CBC WITH DIFFERENTIAL/PLATELET  COMPREHENSIVE METABOLIC PANEL  POC OCCULT BLOOD, ED    EKG None  Radiology No results found.  Procedures Procedures (including critical care time) CRITICAL CARE Performed by: SDewaine Oats  Total critical care time: 35 minutes  Critical care  time was exclusive of  separately billable procedures and treating other patients.  Critical care was necessary to treat or prevent imminent or life-threatening deterioration.  Critical care was time spent personally by me on the following activities: development of treatment plan with patient and/or surrogate as well as nursing, discussions with consultants, evaluation of patient's response to treatment, examination of patient, obtaining history from patient or surrogate, ordering and performing treatments and interventions, ordering and review of laboratory studies, ordering and review of radiographic studies, pulse oximetry and re-evaluation of patient's condition.  Medications Ordered in ED Medications - No data to display  ED Course  I have reviewed the triage vital signs and the nursing notes.  Pertinent labs & imaging results that were available during my care of the patient were reviewed by me and considered in my medical decision making (see chart for details).    MDM Rules/Calculators/A&P                          Patient to ED from home with reported bloody stools x 3 days and melena. No pain, syncope or near syncope. VSS.  The patient is guaiac positive. Hgb 7.0, down from 9.4 two months ago. She is hemodynamically stable, appears well and is in NAD. She is alert and oriented.   She is anticoagulated, h/o a-fib. Will start transfusion of 2 units RBC's. COVID pending. Admit. TRH, Dr. Myna Hidalgo paged.   Final Clinical Impression(s) / ED Diagnoses Final diagnoses:  None   1. Lower GI bleed 2. Coagulopathy   Rx / DC Orders ED Discharge Orders    None       Charlann Lange, PA-C 10/24/19 0601    Fatima Blank, MD 10/25/19 [phone]

## 2019-10-25 DIAGNOSIS — D689 Coagulation defect, unspecified: Secondary | ICD-10-CM | POA: Diagnosis not present

## 2019-10-25 DIAGNOSIS — K449 Diaphragmatic hernia without obstruction or gangrene: Secondary | ICD-10-CM | POA: Diagnosis not present

## 2019-10-25 DIAGNOSIS — K228 Other specified diseases of esophagus: Secondary | ICD-10-CM | POA: Diagnosis not present

## 2019-10-25 DIAGNOSIS — D123 Benign neoplasm of transverse colon: Secondary | ICD-10-CM | POA: Diagnosis not present

## 2019-10-25 DIAGNOSIS — D539 Nutritional anemia, unspecified: Secondary | ICD-10-CM | POA: Diagnosis present

## 2019-10-25 DIAGNOSIS — C50411 Malignant neoplasm of upper-outer quadrant of right female breast: Secondary | ICD-10-CM | POA: Diagnosis not present

## 2019-10-25 DIAGNOSIS — N1832 Chronic kidney disease, stage 3b: Secondary | ICD-10-CM | POA: Diagnosis present

## 2019-10-25 DIAGNOSIS — Z7901 Long term (current) use of anticoagulants: Secondary | ICD-10-CM | POA: Diagnosis not present

## 2019-10-25 DIAGNOSIS — K5731 Diverticulosis of large intestine without perforation or abscess with bleeding: Secondary | ICD-10-CM | POA: Diagnosis not present

## 2019-10-25 DIAGNOSIS — K641 Second degree hemorrhoids: Secondary | ICD-10-CM | POA: Diagnosis present

## 2019-10-25 DIAGNOSIS — I272 Pulmonary hypertension, unspecified: Secondary | ICD-10-CM | POA: Diagnosis present

## 2019-10-25 DIAGNOSIS — D62 Acute posthemorrhagic anemia: Secondary | ICD-10-CM | POA: Diagnosis not present

## 2019-10-25 DIAGNOSIS — D490 Neoplasm of unspecified behavior of digestive system: Secondary | ICD-10-CM | POA: Diagnosis not present

## 2019-10-25 DIAGNOSIS — K922 Gastrointestinal hemorrhage, unspecified: Secondary | ICD-10-CM | POA: Diagnosis not present

## 2019-10-25 DIAGNOSIS — I480 Paroxysmal atrial fibrillation: Secondary | ICD-10-CM | POA: Diagnosis present

## 2019-10-25 DIAGNOSIS — Z20822 Contact with and (suspected) exposure to covid-19: Secondary | ICD-10-CM | POA: Diagnosis not present

## 2019-10-25 DIAGNOSIS — K635 Polyp of colon: Secondary | ICD-10-CM | POA: Diagnosis not present

## 2019-10-25 DIAGNOSIS — K5721 Diverticulitis of large intestine with perforation and abscess with bleeding: Secondary | ICD-10-CM | POA: Diagnosis not present

## 2019-10-25 DIAGNOSIS — K625 Hemorrhage of anus and rectum: Secondary | ICD-10-CM | POA: Diagnosis not present

## 2019-10-25 DIAGNOSIS — D500 Iron deficiency anemia secondary to blood loss (chronic): Secondary | ICD-10-CM | POA: Diagnosis not present

## 2019-10-25 DIAGNOSIS — E279 Disorder of adrenal gland, unspecified: Secondary | ICD-10-CM | POA: Diagnosis present

## 2019-10-25 DIAGNOSIS — D120 Benign neoplasm of cecum: Secondary | ICD-10-CM | POA: Diagnosis not present

## 2019-10-25 DIAGNOSIS — K5790 Diverticulosis of intestine, part unspecified, without perforation or abscess without bleeding: Secondary | ICD-10-CM | POA: Diagnosis not present

## 2019-10-25 DIAGNOSIS — D61818 Other pancytopenia: Secondary | ICD-10-CM | POA: Diagnosis present

## 2019-10-25 DIAGNOSIS — K5730 Diverticulosis of large intestine without perforation or abscess without bleeding: Secondary | ICD-10-CM | POA: Diagnosis not present

## 2019-10-25 DIAGNOSIS — I130 Hypertensive heart and chronic kidney disease with heart failure and stage 1 through stage 4 chronic kidney disease, or unspecified chronic kidney disease: Secondary | ICD-10-CM | POA: Diagnosis present

## 2019-10-25 DIAGNOSIS — K921 Melena: Secondary | ICD-10-CM | POA: Diagnosis present

## 2019-10-25 DIAGNOSIS — E1122 Type 2 diabetes mellitus with diabetic chronic kidney disease: Secondary | ICD-10-CM | POA: Diagnosis present

## 2019-10-25 DIAGNOSIS — N1831 Chronic kidney disease, stage 3a: Secondary | ICD-10-CM | POA: Diagnosis not present

## 2019-10-25 DIAGNOSIS — K633 Ulcer of intestine: Secondary | ICD-10-CM | POA: Diagnosis present

## 2019-10-25 DIAGNOSIS — I5032 Chronic diastolic (congestive) heart failure: Secondary | ICD-10-CM | POA: Diagnosis not present

## 2019-10-25 DIAGNOSIS — D122 Benign neoplasm of ascending colon: Secondary | ICD-10-CM | POA: Diagnosis not present

## 2019-10-25 DIAGNOSIS — K644 Residual hemorrhoidal skin tags: Secondary | ICD-10-CM | POA: Diagnosis present

## 2019-10-25 DIAGNOSIS — K56699 Other intestinal obstruction unspecified as to partial versus complete obstruction: Secondary | ICD-10-CM | POA: Diagnosis present

## 2019-10-25 DIAGNOSIS — K529 Noninfective gastroenteritis and colitis, unspecified: Secondary | ICD-10-CM | POA: Diagnosis not present

## 2019-10-25 DIAGNOSIS — Z853 Personal history of malignant neoplasm of breast: Secondary | ICD-10-CM | POA: Diagnosis not present

## 2019-10-25 LAB — GLUCOSE, CAPILLARY
Glucose-Capillary: 110 mg/dL — ABNORMAL HIGH (ref 70–99)
Glucose-Capillary: 130 mg/dL — ABNORMAL HIGH (ref 70–99)
Glucose-Capillary: 132 mg/dL — ABNORMAL HIGH (ref 70–99)
Glucose-Capillary: 144 mg/dL — ABNORMAL HIGH (ref 70–99)
Glucose-Capillary: 146 mg/dL — ABNORMAL HIGH (ref 70–99)
Glucose-Capillary: 149 mg/dL — ABNORMAL HIGH (ref 70–99)

## 2019-10-25 LAB — BPAM RBC
Blood Product Expiration Date: 202108242359
Blood Product Expiration Date: 202109092359
ISSUE DATE / TIME: 202108101040
ISSUE DATE / TIME: 202108102226
Unit Type and Rh: 5100
Unit Type and Rh: 5100

## 2019-10-25 LAB — BASIC METABOLIC PANEL
Anion gap: 6 (ref 5–15)
BUN: 21 mg/dL (ref 8–23)
CO2: 25 mmol/L (ref 22–32)
Calcium: 7.5 mg/dL — ABNORMAL LOW (ref 8.9–10.3)
Chloride: 111 mmol/L (ref 98–111)
Creatinine, Ser: 1.25 mg/dL — ABNORMAL HIGH (ref 0.44–1.00)
GFR calc Af Amer: 44 mL/min — ABNORMAL LOW (ref >60)
GFR calc non Af Amer: 38 mL/min — ABNORMAL LOW (ref >60)
Glucose, Bld: 121 mg/dL — ABNORMAL HIGH (ref 70–99)
Potassium: 3.8 mmol/L (ref 3.5–5.1)
Sodium: 142 mmol/L (ref 135–145)

## 2019-10-25 LAB — TYPE AND SCREEN
ABO/RH(D): O POS
Antibody Screen: NEGATIVE
Unit division: 0
Unit division: 0

## 2019-10-25 LAB — HEMOGLOBIN AND HEMATOCRIT, BLOOD
HCT: 26.4 % — ABNORMAL LOW (ref 36.0–46.0)
HCT: 26.4 % — ABNORMAL LOW (ref 36.0–46.0)
HCT: 26 % — ABNORMAL LOW (ref 36.0–46.0)
Hemoglobin: 8.8 g/dL — ABNORMAL LOW (ref 12.0–15.0)
Hemoglobin: 8.4 g/dL — ABNORMAL LOW (ref 12.0–15.0)
Hemoglobin: 8.5 g/dL — ABNORMAL LOW (ref 12.0–15.0)

## 2019-10-25 LAB — CBC
HCT: 24.3 % — ABNORMAL LOW (ref 36.0–46.0)
Hemoglobin: 8.1 g/dL — ABNORMAL LOW (ref 12.0–15.0)
MCH: 33.5 pg (ref 26.0–34.0)
MCHC: 33.3 g/dL (ref 30.0–36.0)
MCV: 100.4 fL — ABNORMAL HIGH (ref 80.0–100.0)
Platelets: 118 10*3/uL — ABNORMAL LOW (ref 150–400)
RBC: 2.42 MIL/uL — ABNORMAL LOW (ref 3.87–5.11)
RDW: 17.3 % — ABNORMAL HIGH (ref 11.5–15.5)
WBC: 3.8 10*3/uL — ABNORMAL LOW (ref 4.0–10.5)
nRBC: 0 % (ref 0.0–0.2)

## 2019-10-25 MED ORDER — PEG-KCL-NACL-NASULF-NA ASC-C 100 G PO SOLR
0.5000 | Freq: Once | ORAL | Status: AC
  Administered 2019-10-25: 100 g via ORAL
  Filled 2019-10-25: qty 1

## 2019-10-25 MED ORDER — PEG-KCL-NACL-NASULF-NA ASC-C 100 G PO SOLR
0.5000 | Freq: Once | ORAL | Status: AC
  Administered 2019-10-26: 100 g via ORAL
  Filled 2019-10-25: qty 1

## 2019-10-25 MED ORDER — PEG-KCL-NACL-NASULF-NA ASC-C 100 G PO SOLR: 1.0000 | Freq: Once | ORAL | Status: DC

## 2019-10-25 NOTE — Progress Notes (Signed)
PROGRESS NOTE    Whitney [name]  XFQ:[id number] DOB: December 08, 1927 DOA: 10/24/2019 PCP: Whitney Frizzle, MD   Chief Complaint  Patient presents with  . Rectal Bleeding    Patient states bloody stools that started last week.   Brief Narrative:  Whitney [name] is Whitney [name] 86 y.o. female with medical history significant for paroxysmal atrial fibrillation on Eliquis, breast cancer, type 2 diabetes mellitus, chronic diastolic CHF, and hypertension, now presenting to the emergency department for evaluation of rectal bleeding.  The patient reports that for the past week, she has had recurrent episodes of rectal bleeding without any abdominal pain, nausea, or vomiting.  Patient reports that she feels Whitney [name] sudden urge to defecate, sometimes waking her from sleep, and then sees red and maroon blood in the commode.  This is happened several times over the past week, her daughter witnessed an episode overnight that appeared to involve Whitney [name] large volume of blood, and the patient was brought into the ED for evaluation.  ED Course: Upon arrival to the ED, patient is found to be afebrile, saturating well on room air, with normal heart rate, and stable blood pressure.  Chemistry panel is notable for creatinine of 1.41, similar to priors.  CBC features Whitney [name] hemoglobin of 7.0, down from 9.4 in June 2021.  Fecal occult blood testing is positive.  COVID-19 screening test not yet collected.  Assessment & Plan:   Principal Problem:   Lower GI bleed Active Problems:   Essential hypertension, benign   Breast cancer of upper-outer quadrant of right female breast (HCC)   Chronic diastolic CHF (congestive heart failure), NYHA class 2 (HCC)   Controlled diabetes mellitus type 2 with complications (HCC)   Paroxysmal atrial fibrillation (HCC)   Chronic kidney disease, stage 3a   Anemia  1. Acute lower GI bleeding; Acute Blood Loss Anemia  - Presents with one week of painless hematochezia and is found to have Hgb of 7.0, down  from 9.4 in June 2021  - tagged RBC study with midline pelvic activity, favored bladder activity - plan for upper endoscopy and colonoscopy on 8/12 per GI - s/p 2 units pRBC - trend Hb - transfuse for <7 or symptomatic - Continue to hold eliquis - will need to discuss risk/benefits of resumption after her procedure depending on findings   2. Paroxysmal atrial fibrillation  - CHADS-VASc at least 5 (age x2, gender, CHF, DM)  - Hold Eliquis for now, continue amiodarone and metoprolol as tolerated   3. CKD IIIb  - Creatinine appears closer to baseline today, will continue to monitor - Hold diuretics and ACE-i initially, renally-dose medications, repeat chem panel tomorrow   4. Chronic diastolic CHF  - Appears compensated, monitor weight and I/Os    5. Type II DM  - A1c was 6.1% in May 2021  - Check CBGs and use Whitney [name] low-intensity SSI for now    6. History of breast cancer  - Continue Tamoxifen   DVT prophylaxis: SCD Code Status: full  Family Communication: granddaughter at bedside Disposition:   Status is: Inpatient  Remains inpatient appropriate because:Inpatient level of care appropriate due to severity of illness   Dispo: The patient is from: Home              Anticipated d/c is to: pending              Anticipated d/c date is: 2 days  Patient currently is not medically stable to d/c.  Consultants:   GI   Procedures:  none  Antimicrobials Anti-infectives (From admission, onward)   None     Subjective: No new complaints today  Objective: Vitals:   10/24/19 2254 10/25/19 0117 10/25/19 0558 10/25/19 1219  BP: (!) 118/59 114/65 117/62 112/61  Pulse: 84 88 99 96  Resp: 18 18 17 18   Temp: 98.7 F (37.1 C) 98 F (36.7 C) 98.4 F (36.9 C) 98.3 F (36.8 C)  TempSrc: Oral Oral Oral Oral  SpO2: 99% 100% 98% 98%  Weight:      Height:        Intake/Output Summary (Last 24 hours) at 10/25/2019 1719 Last data filed at 10/25/2019 0800 Gross per 24  hour  Intake 805.17 ml  Output --  Net 805.17 ml   Filed Weights   10/24/19 0457 10/24/19 0854  Weight: 73.9 kg 74.2 kg    Examination:  General exam: Appears calm and comfortable  Respiratory system: Clear to auscultation. Respiratory effort normal. Cardiovascular system: S1 & S2 heard, RRR.  Gastrointestinal system: Abdomen is nondistended, soft and nontender.  Central nervous system: Alert and oriented. No focal neurological deficits. Extremities: chronic L>R edema Skin: No rashes, lesions or ulcers Psychiatry: Judgement and insight appear normal. Mood & affect appropriate.     Data Reviewed: I have personally reviewed following labs and imaging studies  CBC: Recent Labs  Lab 10/24/19 0508 10/24/19 0508 10/24/19 1747 10/24/19 2150 10/25/19 0424 10/25/19 0941 10/25/19 1543  WBC 3.4*  --   --   --  3.8*  --   --   NEUTROABS 2.6  --   --   --   --   --   --   HGB 7.0*   < > 7.6* 6.7* 8.1* 8.8* 8.4*  HCT 22.0*   < > 23.7* 20.5* 24.3* 26.4* 26.4*  MCV 104.8*  --   --   --  100.4*  --   --   PLT 131*  --   --   --  118*  --   --    < > = values in this interval not displayed.    Basic Metabolic Panel: Recent Labs  Lab 10/24/19 0508 10/25/19 0424  NA 141 142  K 3.7 3.8  CL 107 111  CO2 26 25  GLUCOSE 190* 121*  BUN 27* 21  CREATININE 1.41* 1.25*  CALCIUM 8.0* 7.5*    GFR: Estimated Creatinine Clearance: 32.1 mL/min (Whitney [name]) (by C-G formula based on SCr of 1.25 mg/dL (H)).  Liver Function Tests: Recent Labs  Lab 10/24/19 0508  AST 34  ALT 25  ALKPHOS 25*  BILITOT 0.5  PROT 5.2*  ALBUMIN 2.7*    CBG: Recent Labs  Lab 10/25/19 0052 10/25/19 0424 10/25/19 0739 10/25/19 1202 10/25/19 1656  GLUCAP 144* 110* 130* 132* 146*     Recent Results (from the past 240 hour(s))  SARS Coronavirus 2 by RT PCR (hospital order, performed in Whitney [name] hospital lab) Nasopharyngeal Nasopharyngeal Swab     Status: None   Collection Time: 10/24/19  5:56 AM    Specimen: Nasopharyngeal Swab  Result Value Ref Range Status   SARS Coronavirus 2 NEGATIVE NEGATIVE Final    Comment: (NOTE) SARS-CoV-2 target nucleic acids are NOT DETECTED.  The SARS-CoV-2 RNA is generally detectable in upper and lower respiratory specimens during the acute phase of infection. The lowest concentration of SARS-CoV-2 viral copies this assay can detect is 250  copies / mL. Whitney [name] negative result does not preclude SARS-CoV-2 infection and should not be used as the sole basis for treatment or other patient management decisions.  Whitney [name] negative result may occur with improper specimen collection / handling, submission of specimen other than nasopharyngeal swab, presence of viral mutation(s) within the areas targeted by this assay, and inadequate number of viral copies (<250 copies / mL). Whitney [name] negative result must be combined with clinical observations, patient history, and epidemiological information.  Fact Sheet for Patients:   StrictlyIdeas.no  Fact Sheet for Healthcare Providers: BankingDealers.co.za  This test is not yet approved or  cleared by the [place] FDA and has been authorized for detection and/or diagnosis of SARS-CoV-2 by FDA under an Emergency Use Authorization (EUA).  This EUA will remain in effect (meaning this test can be used) for the duration of the COVID-19 declaration under Section 564(b)(1) of the Act, 21 U.S.C. section 360bbb-3(b)(1), unless the authorization is terminated or revoked sooner.  Performed at [name], [place] [address] [place], [place] [address]          Radiology Studies: NM GI Blood Loss  Result Date: 10/24/2019 CLINICAL DATA:  Bloody stools EXAM: NUCLEAR MEDICINE GASTROINTESTINAL BLEEDING SCAN TECHNIQUE: Sequential abdominal images were obtained following intravenous administration of Tc-58mlabeled red blood cells. RADIOPHARMACEUTICALS:  22.0 mCi Tc-974mpertechnetate in-vitro labeled red cells. COMPARISON:  01/01/2016 CT FINDINGS: Activity is seen in the midline of the pelvis. I favor this is activity within the bladder although rectal activity is difficult to exclude. No other areas of radiotracer accumulation. IMPRESSION: Midline pelvic activity late. I favor this is bladder activity although it is difficult to exclude rectal bleeding. Electronically Signed   By: KeRolm Baptise.D.   On: 10/24/2019 17:12        Scheduled Meds: . amiodarone  200 mg Oral Daily  . atorvastatin  10 mg Oral Daily  . doxazosin  2 mg Oral Daily  . insulin aspart  0-9 Units Subcutaneous Q4H  . metoprolol succinate  100 mg Oral Daily  . peg 3350 powder  0.5 kit Oral Once   And  . [START ON 10/26/2019] peg 3350 powder  0.5 kit Oral Once  . sodium chloride flush  3 mL Intravenous Q12H  . tamoxifen  20 mg Oral Daily   Continuous Infusions: . sodium chloride       LOS: 0 days    Time spent: over 30 min    CaFayrene HelperMD [facility]   To contact the attending provider between 7A-7P or the covering provider during after hours 7P-7A, please log into the web site [email or web address] and access using universal [place] password for that web site. If you do not have the password, please call the hospital operator.  10/25/2019, 5:19 PM

## 2019-10-25 NOTE — ED Provider Notes (Signed)
Attestation: Medical screening examination/treatment/procedure(s) were conducted as a shared visit with non-physician practitioner(s) and myself.  I personally evaluated the patient during the encounter.   Briefly, the patient is a 86 y.o. female with h/o history of A. fib on Eliquis who presents here for several bouts of melena and bright red blood over the past 3 days.  Vitals:   10/25/19 0117 10/25/19 0558  BP: 114/65 117/62  Pulse: 88 99  Resp: 18 17  Temp: 98 F (36.7 C) 98.4 F (36.9 C)  SpO2: 100% 98%    CONSTITUTIONAL: Well-appearing, NAD NEURO:  Alert and oriented x 3, no focal deficits EYES:  pupils equal and reactive, pale conjunctival ENT/NECK:  trachea midline, no JVD CARDIO: Regular rate, regular rhythm, well-perfused PULM: None labored breathing GI/GU:  Abdomen non-distended MSK/SPINE:  No gross deformities, no edema SKIN:  no rash, atraumatic PSYCH:  Appropriate speech and behavior   EKG Interpretation  Date/Time:    Ventricular Rate:    PR Interval:    QRS Duration:   QT Interval:    QTC Calculation:   R Axis:     Text Interpretation:         Work up with positive Hemoccult in 2-1/2 g drop in hemoglobin.  Blood transfusion ordered in ED.  Admitted to medicine for further work-up and management.   .Critical Care Performed by: Fatima Blank, MD Authorized by: Fatima Blank, MD     CRITICAL CARE Performed by: Grayce Sessions Garret Teale Total critical care time: 15 minutes Critical care time was exclusive of separately billable procedures and treating other patients. Critical care was necessary to treat or prevent imminent or life-threatening deterioration. Critical care was time spent personally by me on the following activities: development of treatment plan with patient and/or surrogate as well as nursing, discussions with consultants, evaluation of patient's response to treatment, examination of patient, obtaining history from patient  or surrogate, ordering and performing treatments and interventions, ordering and review of laboratory studies, ordering and review of radiographic studies, pulse oximetry and re-evaluation of patient's condition.     Fatima Blank, MD 10/25/19 [phone]

## 2019-10-25 NOTE — Progress Notes (Signed)
Progress Note  CC: rectal bleeding         ASSESSMENT AND PLAN:   86 yo female with PMH significant for, not limited to CHF, pulmonary HTN, PAF on Eliquis, CKD, DM and breast cancer.    # Painless hematochezia on Eliquis --Suspect diverticular hemorrhage ( diverticulosis on CT scan in 2017) --Bleeding scan yesterday wasn't helpful . There was some late pelvic activity favored to be bladder activity but rectal bleeding couldn't be excluded.  --Bleeding has resolved. Had normal BM today ( dark on iron) --Will proceed with colonoscopy, +/- EGD tomorrow to further evaluate bleeding, especially given need for anticoagulation..The risks and benefits of EGD and colonoscopy with possible polypectomy / biopsies were discussed and the patient agrees to proceed.   # Acute on chronic macrocytic anemia  --Hgb 7, down from baseline of mid 9.  --Hgb improved to 8.8 with 2 u PRBC yesterday --Apparently iron deficient in May. Started iron, and IDA resolved on labs in June. Folate and B12 this admit are normal.   # Thrombocytopenia --Count generally runs lower end of normal but currently at 118.      SUBJECTIVE   No complaints. Had normal BM today ( dark on iron), no further bleeding.     OBJECTIVE:     Vital signs in last 24 hours: Temp:  [97.9 F (36.6 C)-99 F (37.2 C)] 98.4 F (36.9 C) (08/11 0558) Pulse Rate:  [81-101] 99 (08/11 0558) Resp:  [15-18] 17 (08/11 0558) BP: (90-132)/(54-74) 117/62 (08/11 0558) SpO2:  [97 %-100 %] 98 % (08/11 0558) Last BM Date: 10/25/19 General:   Alert, in NAD Heart:  Regular rate and rhythm, + murmur   Pulm: Normal respiratory effort   Abdomen:  Soft,  nontender, nondistended.  Normal bowel sounds.          Neurologic:  Alert and  oriented,  grossly normal neurologically. Psych:  Pleasant, cooperative.  Normal mood and affect.   Intake/Output from previous day: 08/10 0701 - 08/11 0700 In: 923.5 [P.O.:240; I.V.:25.2; Blood:658.3] Out: -   Intake/Output this shift: Total I/O In: 360 [P.O.:360] Out: -   Lab Results: Recent Labs    10/24/19 0508 10/24/19 1747 10/24/19 2150 10/25/19 0424 10/25/19 0941  WBC 3.4*  --   --  3.8*  --   HGB 7.0*   < > 6.7* 8.1* 8.8*  HCT 22.0*   < > 20.5* 24.3* 26.4*  PLT 131*  --   --  118*  --    < > = values in this interval not displayed.   BMET Recent Labs    10/24/19 0508 10/25/19 0424  NA 141 142  K 3.7 3.8  CL 107 111  CO2 26 25  GLUCOSE 190* 121*  BUN 27* 21  CREATININE 1.41* 1.25*  CALCIUM 8.0* 7.5*   LFT Recent Labs    10/24/19 0508  PROT 5.2*  ALBUMIN 2.7*  AST 34  ALT 25  ALKPHOS 25*  BILITOT 0.5   PT/INR No results for input(s): LABPROT, INR in the last 72 hours. Hepatitis Panel No results for input(s): HEPBSAG, HCVAB, HEPAIGM, HEPBIGM in the last 72 hours.  NM GI Blood Loss  Result Date: 10/24/2019 CLINICAL DATA:  Bloody stools EXAM: NUCLEAR MEDICINE GASTROINTESTINAL BLEEDING SCAN TECHNIQUE: Sequential abdominal images were obtained following intravenous administration of Tc-43m labeled red blood cells. RADIOPHARMACEUTICALS:  22.0 mCi Tc-63m pertechnetate in-vitro labeled red cells. COMPARISON:  01/01/2016 CT FINDINGS: Activity is seen in the midline  of the pelvis. I favor this is activity within the bladder although rectal activity is difficult to exclude. No other areas of radiotracer accumulation. IMPRESSION: Midline pelvic activity late. I favor this is bladder activity although it is difficult to exclude rectal bleeding. Electronically Signed   By: Rolm Baptise M.D.   On: 10/24/2019 17:12    Principal Problem:   Lower GI bleed Active Problems:   Essential hypertension, benign   Breast cancer of upper-outer quadrant of right female breast (HCC)   Chronic diastolic CHF (congestive heart failure), NYHA class 2 (HCC)   Controlled diabetes mellitus type 2 with complications (HCC)   Paroxysmal atrial fibrillation (HCC)   Chronic kidney disease, stage  3a     LOS: 0 days   Tye Savoy ,NP 10/25/2019, 11:18 AM

## 2019-10-25 NOTE — Care Management Obs Status (Signed)
[facility] NOTIFICATION   Patient Details  Name: Whitney [name] MRN: [id number] Date of Birth: Dec 27, 1927   Medicare Observation Status Notification Given:  Yes    Lynnell Catalan, RN 10/25/2019, 4:12 PM

## 2019-10-26 ENCOUNTER — Inpatient Hospital Stay (HOSPITAL_COMMUNITY): Payer: Medicare Other | Admitting: Anesthesiology

## 2019-10-26 ENCOUNTER — Encounter (HOSPITAL_COMMUNITY)
Admission: EM | Disposition: A | Discharge status: Home / Self Care | Payer: Self-pay | Source: Home / Self Care | Attending: Family Medicine

## 2019-10-26 ENCOUNTER — Encounter (HOSPITAL_COMMUNITY): Payer: Self-pay | Admitting: Family Medicine

## 2019-10-26 DIAGNOSIS — K228 Other specified diseases of esophagus: Secondary | ICD-10-CM

## 2019-10-26 DIAGNOSIS — K922 Gastrointestinal hemorrhage, unspecified: Secondary | ICD-10-CM | POA: Diagnosis not present

## 2019-10-26 DIAGNOSIS — D490 Neoplasm of unspecified behavior of digestive system: Secondary | ICD-10-CM

## 2019-10-26 DIAGNOSIS — K635 Polyp of colon: Secondary | ICD-10-CM

## 2019-10-26 DIAGNOSIS — K921 Melena: Secondary | ICD-10-CM

## 2019-10-26 HISTORY — PX: BIOPSY: SHX5522

## 2019-10-26 HISTORY — PX: SUBMUCOSAL TATTOO INJECTION: SHX6856

## 2019-10-26 HISTORY — PX: POLYPECTOMY: SHX5525

## 2019-10-26 HISTORY — PX: ESOPHAGOGASTRODUODENOSCOPY (EGD) WITH PROPOFOL: SHX5813

## 2019-10-26 HISTORY — PX: COLONOSCOPY WITH PROPOFOL: SHX5780

## 2019-10-26 HISTORY — PX: HEMOSTASIS CLIP PLACEMENT: SHX6857

## 2019-10-26 LAB — GLUCOSE, CAPILLARY
Glucose-Capillary: 114 mg/dL — ABNORMAL HIGH (ref 70–99)
Glucose-Capillary: 118 mg/dL — ABNORMAL HIGH (ref 70–99)
Glucose-Capillary: 119 mg/dL — ABNORMAL HIGH (ref 70–99)
Glucose-Capillary: 136 mg/dL — ABNORMAL HIGH (ref 70–99)
Glucose-Capillary: 138 mg/dL — ABNORMAL HIGH (ref 70–99)
Glucose-Capillary: 147 mg/dL — ABNORMAL HIGH (ref 70–99)
Glucose-Capillary: 78 mg/dL (ref 70–99)

## 2019-10-26 LAB — CBC
HCT: 26 % — ABNORMAL LOW (ref 36.0–46.0)
Hemoglobin: 8.6 g/dL — ABNORMAL LOW (ref 12.0–15.0)
MCH: 33 pg (ref 26.0–34.0)
MCHC: 33.1 g/dL (ref 30.0–36.0)
MCV: 99.6 fL (ref 80.0–100.0)
Platelets: 133 10*3/uL — ABNORMAL LOW (ref 150–400)
RBC: 2.61 MIL/uL — ABNORMAL LOW (ref 3.87–5.11)
RDW: 17.3 % — ABNORMAL HIGH (ref 11.5–15.5)
WBC: 5.2 10*3/uL (ref 4.0–10.5)
nRBC: 0.4 % — ABNORMAL HIGH (ref 0.0–0.2)

## 2019-10-26 LAB — BASIC METABOLIC PANEL
Anion gap: 11 (ref 5–15)
BUN: 16 mg/dL (ref 8–23)
CO2: 21 mmol/L — ABNORMAL LOW (ref 22–32)
Calcium: 7.8 mg/dL — ABNORMAL LOW (ref 8.9–10.3)
Chloride: 110 mmol/L (ref 98–111)
Creatinine, Ser: 1.19 mg/dL — ABNORMAL HIGH (ref 0.44–1.00)
GFR calc Af Amer: 47 mL/min — ABNORMAL LOW (ref >60)
GFR calc non Af Amer: 40 mL/min — ABNORMAL LOW (ref >60)
Glucose, Bld: 127 mg/dL — ABNORMAL HIGH (ref 70–99)
Potassium: 3.5 mmol/L (ref 3.5–5.1)
Sodium: 142 mmol/L (ref 135–145)

## 2019-10-26 LAB — HEMOGLOBIN AND HEMATOCRIT, BLOOD
HCT: 26.6 % — ABNORMAL LOW (ref 36.0–46.0)
HCT: 22.6 % — ABNORMAL LOW (ref 36.0–46.0)
Hemoglobin: 8.5 g/dL — ABNORMAL LOW (ref 12.0–15.0)
Hemoglobin: 7.4 g/dL — ABNORMAL LOW (ref 12.0–15.0)

## 2019-10-26 SURGERY — COLONOSCOPY WITH PROPOFOL
Anesthesia: Monitor Anesthesia Care

## 2019-10-26 MED ORDER — ADULT MULTIVITAMIN W/MINERALS CH
1.0000 | ORAL_TABLET | Freq: Every day | ORAL | Status: DC
  Administered 2019-10-26 – 2019-10-30 (×5): 1 via ORAL
  Filled 2019-10-26 (×5): qty 1

## 2019-10-26 MED ORDER — PROPOFOL 500 MG/50ML IV EMUL
INTRAVENOUS | Status: DC | PRN
  Administered 2019-10-26: 75 ug/kg/min via INTRAVENOUS

## 2019-10-26 MED ORDER — LACTATED RINGERS IV SOLN
INTRAVENOUS | Status: DC | PRN
Start: 2019-10-26 — End: 2019-10-26

## 2019-10-26 MED ORDER — ENSURE ENLIVE PO LIQD
237.0000 mL | Freq: Two times a day (BID) | ORAL | Status: DC
  Administered 2019-10-27 – 2019-10-30 (×2): 237 mL via ORAL

## 2019-10-26 MED ORDER — SPOT INK MARKER SYRINGE KIT
PACK | SUBMUCOSAL | Status: DC | PRN
  Administered 2019-10-26: 1 mL via SUBMUCOSAL
  Administered 2019-10-26: .5 mL via SUBMUCOSAL

## 2019-10-26 MED ORDER — SPOT INK MARKER SYRINGE KIT
PACK | SUBMUCOSAL | Status: AC
  Filled 2019-10-26: qty 5

## 2019-10-26 MED ORDER — PROPOFOL 500 MG/50ML IV EMUL
INTRAVENOUS | Status: AC
  Filled 2019-10-26: qty 50

## 2019-10-26 MED ORDER — PROPOFOL 10 MG/ML IV BOLUS
INTRAVENOUS | Status: AC
  Filled 2019-10-26: qty 40

## 2019-10-26 MED ORDER — PROPOFOL 10 MG/ML IV BOLUS
INTRAVENOUS | Status: DC | PRN
  Administered 2019-10-26: 20 mg via INTRAVENOUS
  Administered 2019-10-26: 40 mg via INTRAVENOUS
  Administered 2019-10-26 (×2): 20 mg via INTRAVENOUS
  Administered 2019-10-26: 10 mg via INTRAVENOUS

## 2019-10-26 SURGICAL SUPPLY — 25 items

## 2019-10-26 NOTE — Transfer of Care (Signed)
Immediate Anesthesia Transfer of Care Note  Patient: Whitney [name]  Procedure(s) Performed: COLONOSCOPY WITH PROPOFOL (N/A ) ESOPHAGOGASTRODUODENOSCOPY (EGD) WITH PROPOFOL (N/A ) POLYPECTOMY SUBMUCOSAL TATTOO INJECTION BIOPSY HEMOSTASIS CLIP PLACEMENT  Patient Location: PACU and Endoscopy Unit  Anesthesia Type:MAC  Level of Consciousness: awake and patient cooperative  Airway & Oxygen Therapy: Patient Spontanous Breathing and Patient connected to face mask oxygen  Post-op Assessment: Report given to RN and Post -op Vital signs reviewed and stable  Post vital signs: Reviewed and stable  Last Vitals:  Vitals Value Taken Time  BP    Temp    Pulse    Resp 15 10/26/19 1132  SpO2    Vitals shown include unvalidated device data.  Last Pain:  Vitals:   10/26/19 0911  TempSrc: Oral  PainSc: 0-No pain         Complications: No complications documented.

## 2019-10-26 NOTE — Anesthesia Postprocedure Evaluation (Signed)
Anesthesia Post Note  Patient: Whitney [name]  Procedure(s) Performed: COLONOSCOPY WITH PROPOFOL (N/A ) ESOPHAGOGASTRODUODENOSCOPY (EGD) WITH PROPOFOL (N/A ) POLYPECTOMY SUBMUCOSAL TATTOO INJECTION BIOPSY HEMOSTASIS CLIP PLACEMENT     Patient location during evaluation: PACU Anesthesia Type: MAC Level of consciousness: awake and alert Pain management: pain level controlled Vital Signs Assessment: post-procedure vital signs reviewed and stable Respiratory status: spontaneous breathing, nonlabored ventilation, respiratory function stable and patient connected to nasal cannula oxygen Cardiovascular status: stable and blood pressure returned to baseline Postop Assessment: no apparent nausea or vomiting Anesthetic complications: no   No complications documented.  Last Vitals:  Vitals:   10/26/19 1200 10/26/19 1210  BP:  (!) 152/91  Pulse:  79  Resp:  (!) 23  Temp:    SpO2: 98% 99%    Last Pain:  Vitals:   10/26/19 1210  TempSrc:   PainSc: 0-No pain                 Effie Berkshire

## 2019-10-26 NOTE — Progress Notes (Signed)
Initial Nutrition Assessment  DOCUMENTATION CODES:   Not applicable  INTERVENTION:  Ensure Enlive po BID, each supplement provides 350 kcal and 20 grams of protein  MVI daily  NUTRITION DIAGNOSIS:   Predicted suboptimal nutrient intake related to decreased appetite as evidenced by other (comment) (per MST report).    GOAL:   Patient will meet greater than or equal to 90% of their needs    MONITOR:   PO intake, Supplement acceptance, Weight trends, Labs, Diet advancement  REASON FOR ASSESSMENT:   Malnutrition Screening Tool    ASSESSMENT:   Pt admitted with acute lower GI bleeding and acute blood loss anemia. PMH includes Afib, breast Ca, type 2 DM, CHF, HTN  Pt unavailable at time of RD visit.   Per GI, pt having upper endoscopy and colonoscopy today.   Wt hx reviewed; no significant wt changes noted.   PO Intake: 100% x 1 recorded meal  Labs: CBGs [phone] Medications: Novolog  NUTRITION - FOCUSED PHYSICAL EXAM:  Unable to perform at this time, will attempt at follow up.  Diet Order:   Diet Order            DIET SOFT Room service appropriate? Yes; Fluid consistency: Thin  Diet effective now                 EDUCATION NEEDS:   No education needs have been identified at this time  Skin:  Skin Assessment: Reviewed RN Assessment  Last BM:  8/11  Height:   Ht Readings from Last 1 Encounters:  10/24/19 5\' 7"  (1.702 m)    Weight:   Wt Readings from Last 10 Encounters:  10/24/19 74.2 kg  09/01/19 73.9 kg  07/31/19 76.2 kg  07/19/19 75.8 kg  05/26/19 76.3 kg  10/28/18 75.8 kg  09/23/18 76.7 kg  08/11/18 76.7 kg  08/09/18 79.4 kg  07/05/18 77.1 kg    BMI:  Body mass index is 25.62 kg/m.  Estimated Nutritional Needs:   Kcal:  1600-1800  Protein:  80-90 grams  Fluid:  >/= 1.6L/d    Larkin Ina, MS, RD, LDN RD pager number and weekend/on-call pager number located in [place].

## 2019-10-26 NOTE — Progress Notes (Signed)
PROGRESS NOTE    Whitney [name]  YIA:[id number] DOB: 04/06/1927 DOA: 10/24/2019 PCP: [name], MD   Chief Complaint  Patient presents with  . Rectal Bleeding    Patient states bloody stools that started last week.   Brief Narrative:  Whitney [name] is Whitney [name] 86 y.o. female with medical history significant for paroxysmal atrial fibrillation on Eliquis, breast cancer, type 2 diabetes mellitus, chronic diastolic CHF, and hypertension, now presenting to the emergency department for evaluation of rectal bleeding.  The patient reports that for the past week, she has had recurrent episodes of rectal bleeding without any abdominal pain, nausea, or vomiting.  Patient reports that she feels [name] sudden urge to defecate, sometimes waking her from sleep, and then sees red and maroon blood in the commode.  This is happened several times over the past week, her daughter witnessed an episode overnight that appeared to involve [name] large volume of blood, and the patient was brought into the ED for evaluation.  ED Course: Upon arrival to the ED, patient is found to be afebrile, saturating well on room air, with normal heart rate, and stable blood pressure.  Chemistry panel is notable for creatinine of 1.41, similar to priors.  CBC features [name] hemoglobin of 7.0, down from 9.4 in June 2021.  Fecal occult blood testing is positive.  COVID-19 screening test not yet collected.  Assessment & Plan:   Principal Problem:   Lower GI bleed Active Problems:   Essential hypertension, benign   Breast cancer of upper-outer quadrant of right female breast (HCC)   Chronic diastolic CHF (congestive heart failure), NYHA class 2 (HCC)   Controlled diabetes mellitus type 2 with complications (HCC)   Paroxysmal atrial fibrillation (HCC)   Chronic kidney disease, stage 3a   Anemia  1. Acute lower GI bleeding; Acute Blood Loss Anemia  - Presents with one week of painless hematochezia and is found to have Hgb of 7.0, down  from 9.4 in June 2021  - tagged RBC study with midline pelvic activity, favored bladder activity - s/p upper endoscopy and colonoscopy 8/12 (see below, follow biopsies) - s/p 2 units pRBC - trend Hb - transfuse for <7 or symptomatic - Continue to hold eliquis (per GI additional 48-72 hrs) - will need to discuss risk/benefits of resumption after her procedure depending on findings   2. Paroxysmal atrial fibrillation  - CHADS-VASc at least 5 (age x2, gender, CHF, DM)  - Hold Eliquis for now, continue amiodarone and metoprolol as tolerated   3. CKD IIIb  - Creatinine appears closer to baseline today, will continue to monitor - Hold diuretics and ACE-i initially, renally-dose medications, repeat chem panel tomorrow   4. Chronic diastolic CHF  - Appears compensated, monitor weight and I/Os    5. Type II DM  - A1c was 6.1% in May 2021  - Check CBGs and use [name] low-intensity SSI for now    6. History of breast cancer  - Continue Tamoxifen   DVT prophylaxis: SCD Code Status: full  Family Communication: granddaughter at bedside Disposition:   Status is: Inpatient  Remains inpatient appropriate because:Inpatient level of care appropriate due to severity of illness   Dispo: The patient is from: Home              Anticipated d/c is to: pending              Anticipated d/c date is: 2 days  Patient currently is not medically stable to d/c.  Consultants:   GI   Procedures:  Colonoscopy Impression - Hemorrhoids found on digital rectal exam. - Stool in the entire examined colon. Lavaged with adequate visualization. - Two 4 to 10 mm polyps in the ascending colon and in the cecum, removed with [name] cold snare. Resected and retrieved. - Rule out malignancy, polypoid lesion in the transverse colon. Biopsied. Tattooed for marking purposes. Clip (MR conditional) was placed for marking purposes. - Patchy moderate inflammation was found in the proximal sigmoid colon and in  the distal descending colon secondary to colitis - proximal to the narrowing and sever diverticulosis noted below. Biopsied. - Severe diverticulosis in the recto-sigmoid colon, in the sigmoid colon and in the descending colon. There was narrowing of the colon in association with the diverticular opening. There was evidence of diverticular spasm. Peri-diverticular erythema was seen. There was no evidence of diverticular bleeding. - Stenosis in the recto-sigmoid colon in diverticular region that was congested with some concern for prolapsing of proximal tissue downwards that could lead to intussuception. Biopsied. - Non-bleeding non-thrombosed external and internal hemorrhoids. Recommendations - The patient will be observed post-procedure, until all discharge criteria are met. - Return patient to hospital ward for ongoing care. - Advance diet as tolerated. - Hold on restart of any NOAC for at least 48-72 hours. If anticoagulation is needed may consider IV Heparin in 12-hours (1000PM) without bolus. - Await pathology results. - Will decide next steps based on pathology. - The findings and recommendations were discussed with the patient. - The findings and recommendations were discussed with the patient's family.  Endoscopy Impression - Bleb found in the esophagus. - No gross lesions in esophagus. - Z-line regular, 38 cm from the incisors. - 4 cm hiatal hernia. - No gross lesions in the stomach. - No gross lesions in the duodenal bulb, in the first portion of the duodenum and in the second portion of the duodenum. - No source for GI bleeding found on today's EGD. Recommendations - Proceed to scheduled colonoscopy. - The findings and recommendations were discussed with the patient. - The findings and recommendations were discussed with the patient's family.  Antimicrobials Anti-infectives (From admission, onward)   None     Subjective: No new complaints  Objective: Vitals:    10/26/19 1200 10/26/19 1210 10/26/19 1220 10/26/19 1239  BP:  (!) 152/91 (!) 145/100 (!) 145/99  Pulse:  79 92 76  Resp:  (!) _0 Temp:    (!) 97.4 F (36.3 C)  TempSrc:    Oral  SpO2: 98% 99% 99% 99%  Weight:      Height:        Intake/Output Summary (Last 24 hours) at 10/26/2019 1901 Last data filed at 10/26/2019 1133 Gross per 24 hour  Intake 500 ml  Output --  Net 500 ml   Filed Weights   10/24/19 0457 10/24/19 0854  Weight: 73.9 kg 74.2 kg    Examination:  General: No acute distress. Cardiovascular: Heart sounds show Devyon Keator regular rate, and rhythm.  Lungs: Clear to auscultation bilaterally Abdomen: Soft, nontender, nondistended Neurological: Alert and oriented 3. Moves all extremities 4.  Cranial nerves II through XII grossly intact. Skin: Warm and dry. No rashes or lesions. Extremities: No clubbing or cyanosis. No edema.   Data Reviewed: I have personally reviewed following labs and imaging studies  CBC: Recent Labs  Lab 10/24/19 0508 10/24/19 1747 10/25/19 0424 10/25/19 0424 10/25/19 0941 10/25/19  1543 10/25/19 2206 10/26/19 0555 10/26/19 1403  WBC 3.4*  --  3.8*  --   --   --   --  5.2  --   NEUTROABS 2.6  --   --   --   --   --   --   --   --   HGB 7.0*   < > 8.1*   < > 8.8* 8.4* 8.5* 8.6* 8.5*  HCT 22.0*   < > 24.3*   < > 26.4* 26.4* 26.0* 26.0* 26.6*  MCV 104.8*  --  100.4*  --   --   --   --  99.6  --   PLT 131*  --  118*  --   --   --   --  133*  --    < > = values in this interval not displayed.    Basic Metabolic Panel: Recent Labs  Lab 10/24/19 0508 10/25/19 0424 10/26/19 0555  NA 141 142 142  K 3.7 3.8 3.5  CL 107 111 110  CO2 26 25 21*  GLUCOSE 190* 121* 127*  BUN 27* 21 16  CREATININE 1.41* 1.25* 1.19*  CALCIUM 8.0* 7.5* 7.8*    GFR: Estimated Creatinine Clearance: 33.7 mL/min (Izen Petz) (by C-G formula based on SCr of 1.19 mg/dL (H)).  Liver Function Tests: Recent Labs  Lab 10/24/19 0508  AST 34  ALT 25  ALKPHOS 25*   BILITOT 0.5  PROT 5.2*  ALBUMIN 2.7*    CBG: Recent Labs  Lab 10/26/19 0052 10/26/19 0348 10/26/19 0736 10/26/19 0928 10/26/19 1723  GLUCAP 136* 114* 119* 118* 138*     Recent Results (from the past 240 hour(s))  SARS Coronavirus 2 by RT PCR (hospital order, performed in [facility] hospital lab) Nasopharyngeal Nasopharyngeal Swab     Status: None   Collection Time: 10/24/19  5:56 AM   Specimen: Nasopharyngeal Swab  Result Value Ref Range Status   SARS Coronavirus 2 NEGATIVE NEGATIVE Final    Comment: (NOTE) SARS-CoV-2 target nucleic acids are NOT DETECTED.  The SARS-CoV-2 RNA is generally detectable in upper and lower respiratory specimens during the acute phase of infection. The lowest concentration of SARS-CoV-2 viral copies this assay can detect is 250 copies / mL. Farryn Linares negative result does not preclude SARS-CoV-2 infection and should not be used as the sole basis for treatment or other patient management decisions.  Spenser Cong negative result may occur with improper specimen collection / handling, submission of specimen other than nasopharyngeal swab, presence of viral mutation(s) within the areas targeted by this assay, and inadequate number of viral copies (<250 copies / mL). Dolan Xia negative result must be combined with clinical observations, patient history, and epidemiological information.  Fact Sheet for Patients:   StrictlyIdeas.no  Fact Sheet for Healthcare Providers: BankingDealers.co.za  This test is not yet approved or  cleared by the [place] FDA and has been authorized for detection and/or diagnosis of SARS-CoV-2 by FDA under an Emergency Use Authorization (EUA).  This EUA will remain in effect (meaning this test can be used) for the duration of the COVID-19 declaration under Section 564(b)(1) of the Act, 21 U.S.C. section 360bbb-3(b)(1), unless the authorization is terminated or revoked sooner.  Performed at  [facility], [place] [address], [place], [place] [address]          Radiology Studies: No results found.      Scheduled Meds: . amiodarone  200 mg Oral Daily  . atorvastatin  10 mg  Oral Daily  . doxazosin  2 mg Oral Daily  . [START ON 10/27/2019] feeding supplement (ENSURE ENLIVE)  237 mL Oral BID BM  . insulin aspart  0-9 Units Subcutaneous Q4H  . metoprolol succinate  100 mg Oral Daily  . multivitamin with minerals  1 tablet Oral Daily  . sodium chloride flush  3 mL Intravenous Q12H  . tamoxifen  20 mg Oral Daily   Continuous Infusions: . sodium chloride       LOS: 1 day    Time spent: over 30 min    Fayrene Helper, MD [facility]   To contact the attending provider between 7A-7P or the covering provider during after hours 7P-7A, please log into the web site [email or web address] and access using universal [place] password for that web site. If you do not have the password, please call the hospital operator.  10/26/2019, 7:01 PM

## 2019-10-26 NOTE — Anesthesia Preprocedure Evaluation (Addendum)
Anesthesia Evaluation  Patient identified by MRN, date of birth, ID band Patient awake    Reviewed: Allergy & Precautions, NPO status , Patient's Chart, lab work & pertinent test results  Airway Mallampati: I  TM Distance: >3 FB Neck ROM: Full    Dental  (+) Edentulous Upper, Partial Lower   Pulmonary former smoker,    breath sounds clear to auscultation       Cardiovascular hypertension, Pt. on medications and Pt. on home beta blockers +CHF  + dysrhythmias  Rhythm:Regular Rate:Normal     Neuro/Psych negative neurological ROS  negative psych ROS   GI/Hepatic negative GI ROS, Neg liver ROS,   Endo/Other  diabetesHypothyroidism   Renal/GU Renal InsufficiencyRenal disease     Musculoskeletal   Abdominal Normal abdominal exam  (+)   Peds  Hematology   Anesthesia Other Findings   Reproductive/Obstetrics                            Anesthesia Physical Anesthesia Plan  ASA: III  Anesthesia Plan: MAC   Post-op Pain Management:    Induction: Intravenous  PONV Risk Score and Plan: 0 and Propofol infusion  Airway Management Planned: Natural Airway and Simple Face Mask  Additional Equipment: None  Intra-op Plan:   Post-operative Plan:   Informed Consent: I have reviewed the patients History and Physical, chart, labs and discussed the procedure including the risks, benefits and alternatives for the proposed anesthesia with the patient or authorized representative who has indicated his/her understanding and acceptance.       Plan Discussed with: CRNA  Anesthesia Plan Comments:        Anesthesia Quick Evaluation

## 2019-10-26 NOTE — Op Note (Addendum)
[facility] Patient Name: Whitney [name] Procedure Date: 10/26/2019 MRN: [id number] Attending MD: Justice Britain , MD Date of Birth: February 12, 1928 CSN: [id number] Age: 86 Admit Type: Outpatient Procedure:                Upper GI endoscopy Indications:              Iron deficiency (previously currently macrocytic),                            Hematochezia Providers:                Justice Britain, MD, Josie Dixon, RN,                            Janee Morn, Technician, Lazaro Arms,                            Technician Referring MD:             Lajuan Lines. Pyrtle, MD, [facility] Medicines:                Monitored Anesthesia Care Complications:            No immediate complications. Estimated Blood Loss:     Estimated blood loss: none. Procedure:                Pre-Anesthesia Assessment:                           - Prior to the procedure, a History and Physical                            was performed, and patient medications and                            allergies were reviewed. The patient's tolerance of                            previous anesthesia was also reviewed. The risks                            and benefits of the procedure and the sedation                            options and risks were discussed with the patient.                            All questions were answered, and informed consent                            was obtained. Prior Anticoagulants: The patient has                            taken Eliquis (apixaban), last dose was 4 days                            prior to procedure.  ASA Grade Assessment: III - A                            patient with severe systemic disease. After                            reviewing the risks and benefits, the patient was                            deemed in satisfactory condition to undergo the                            procedure.                           After obtaining informed consent, the  endoscope was                            passed under direct vision. Throughout the                            procedure, the patient's blood pressure, pulse, and                            oxygen saturations were monitored continuously. The                            GIF-H190 ([id number]) Olympus gastroscope was                            introduced through the mouth, and advanced to the                            second part of duodenum. The upper GI endoscopy was                            accomplished without difficulty. The patient                            tolerated the procedure. Scope In: Scope Out: Findings:      A few small blebs were found in the proximal esophagus.      No gross lesions were noted in the entire esophagus.      The Z-line was regular and was found 38 cm from the incisors.      A 4 cm hiatal hernia was present.      No gross lesions were noted in the entire examined stomach.      No gross lesions were noted in the duodenal bulb, in the first portion       of the duodenum and in the second portion of the duodenum. Impression:               - Bleb found in the esophagus.                           - No gross lesions in  esophagus.                           - Z-line regular, 38 cm from the incisors.                           - 4 cm hiatal hernia.                           - No gross lesions in the stomach.                           - No gross lesions in the duodenal bulb, in the                            first portion of the duodenum and in the second                            portion of the duodenum.                           - No source for GI bleeding found on today's EGD. Moderate Sedation:      Not Applicable - Patient had care per Anesthesia. Recommendation:           - Proceed to scheduled colonoscopy.                           - The findings and recommendations were discussed                            with the patient.                           - The  findings and recommendations were discussed                            with the patient's family. Procedure Code(s):        --- Professional ---                           [phone], Esophagogastroduodenoscopy, flexible,                            transoral; diagnostic, including collection of                            specimen(s) by brushing or washing, when performed                            (separate procedure) Diagnosis Code(s):        --- Professional ---                           K22.8, Other specified diseases of esophagus  K44.9, Diaphragmatic hernia without obstruction or                            gangrene                           D50.9, Iron deficiency anemia, unspecified                           K92.1, Melena (includes Hematochezia) CPT copyright 2019 [employer]. All rights reserved. The codes documented in this report are preliminary and upon coder review may  be revised to meet current compliance requirements. Justice Britain, MD 10/26/2019 11:43:56 AM Number of Addenda: 0

## 2019-10-26 NOTE — Op Note (Signed)
[facility] Patient Name: Whitney [name] Procedure Date: 10/26/2019 MRN: [id number] Attending MD: Justice Britain , MD Date of Birth: 1927-12-13 CSN: [id number] Age: 86 Admit Type: Outpatient Procedure:                Colonoscopy Indications:              Hematochezia, Diverticula, Exclusion of colon                            diverticulosis with hemorrhage Providers:                Justice Britain, MD Referring MD:             Lajuan Lines. Pyrtle, MD, [facility] Medicines:                Monitored Anesthesia Care Complications:            No immediate complications. Estimated Blood Loss:     Estimated blood loss was minimal. Procedure:                Pre-Anesthesia Assessment:                           - Prior to the procedure, a History and Physical                            was performed, and patient medications and                            allergies were reviewed. The patient's tolerance of                            previous anesthesia was also reviewed. The risks                            and benefits of the procedure and the sedation                            options and risks were discussed with the patient.                            All questions were answered, and informed consent                            was obtained. Prior Anticoagulants: The patient has                            taken Eliquis (apixaban), last dose was 4 days                            prior to procedure. ASA Grade Assessment: III - A                            patient with severe systemic disease. After  reviewing the risks and benefits, the patient was                            deemed in satisfactory condition to undergo the                            procedure.                           After obtaining informed consent, the colonoscope                            was passed under direct vision. Throughout the                             procedure, the patient's blood pressure, pulse, and                            oxygen saturations were monitored continuously. The                            PCF-H190DL ([id number] Olympus pediatric colonscope                            was introduced through the anus and advanced only                            to the rectosigmoid colon and could not traverse                            the region of narrowing. The PCF-PH190L ([id number])                            Olympus ultra slim colonoscope was introduced                            through the anus and advanced to the the cecum,                            identified by appendiceal orifice and ileocecal                            valve. The colonoscopy was technically difficult                            and complex due to bowel stenosis. Successful                            completion of the procedure was aided by changing                            the patient's position, withdrawing and reinserting  the scope, withdrawing the scope and replacing with                            the ultraslim colonoscope, straightening and                            shortening the scope to obtain bowel loop reduction                            and using scope torsion. The patient tolerated the                            procedure. The quality of the bowel preparation was                            adequate. The ileocecal valve, appendiceal orifice,                            and rectum were photographed. Scope In: 10:17:15 AM Scope Out: 11:19:30 AM Scope Withdrawal Time: 0 hours 32 minutes 22 seconds  Total Procedure Duration: 1 hour 2 minutes 15 seconds  Findings:      The digital rectal exam findings include hemorrhoids and an external       skin tag. Pertinent negatives include no palpable rectal lesions.      A large amount of semi-liquid semi-solid stool was found in the entire       colon, interfering with visualization.  Lavage of the area was performed       using copious amounts, resulting in clearance with adequate       visualization.      Two sessile polyps were found in the ascending colon and cecum. The       polyps were 4 to 10 mm in size. These polyps were removed with a cold       snare. Resection and retrieval were complete.      A 40 mm polypoid lesion was found in the presumed transverse colon -       there was loss of pit pattern concerning for JNET 3 categorization and       potentially increased risk lesion. The lesion was frond-like/villous. No       bleeding was present. Biopsies were taken with a cold forceps for       histology. Area was tattooed with an injection of Spot (carbon black) on       contralateral wall. For location marking, one hemostatic clip was       successfully placed (MR conditional) in region.      Patchy moderate inflammation characterized by erosions, erythema,       friability and shallow ulcerations was found in the proximal sigmoid       colon and in the distal descending colon. Biopsies were taken with a       cold forceps for histology. These were found proximal to the severe       narrowing/diverticulosis noted below.      Many small and large-mouthed diverticula were found in the recto-sigmoid       colon, sigmoid colon and descending colon. There was narrowing of the       colon in association with  the diverticular opening. There was evidence       of diverticular spasm. Peri-diverticular erythema was seen. There was no       evidence of diverticular bleeding.      A benign-appearing, intrinsic severe stenosis measuring 8 cm (in length)       x 1 cm (inner diameter) was found in the recto-sigmoid colon and was       traversed and this consisted with the area in the diverticular region.       Prolapsing of tissue was seen as if there could be intussuception of the       tissue. Biopsies were taken with a cold forceps for histology though not       clearly  malignant appearing.      Non-bleeding non-thrombosed external and internal hemorrhoids were found       during retroflexion, during perianal exam and during digital exam. The       hemorrhoids were Grade II (internal hemorrhoids that prolapse but reduce       spontaneously). Impression:               - Hemorrhoids found on digital rectal exam.                           - Stool in the entire examined colon. Lavaged with                            adequate visualization.                           - Two 4 to 10 mm polyps in the ascending colon and                            in the cecum, removed with a cold snare. Resected                            and retrieved.                           - Rule out malignancy, polypoid lesion in the                            transverse colon. Biopsied. Tattooed for marking                            purposes. Clip (MR conditional) was placed for                            marking purposes.                           - Patchy moderate inflammation was found in the                            proximal sigmoid colon and in the distal descending                            colon secondary to  colitis - proximal to the                            narrowing and sever diverticulosis noted below.                            Biopsied.                           - Severe diverticulosis in the recto-sigmoid colon,                            in the sigmoid colon and in the descending colon.                            There was narrowing of the colon in association                            with the diverticular opening. There was evidence                            of diverticular spasm. Peri-diverticular erythema                            was seen. There was no evidence of diverticular                            bleeding.                           - Stenosis in the recto-sigmoid colon in                            diverticular region that was congested with some                             concern for prolapsing of proximal tissue downwards                            that could lead to intussuception. Biopsied.                           - Non-bleeding non-thrombosed external and internal                            hemorrhoids. Moderate Sedation:      Not Applicable - Patient had care per Anesthesia. Recommendation:           - The patient will be observed post-procedure,                            until all discharge criteria are met.                           - Return patient to hospital ward for ongoing care.                           -  Advance diet as tolerated.                           - Hold on restart of any NOAC for at least 48-72                            hours. If anticoagulation is needed may consider IV                            Heparin in 12-hours (1000PM) without bolus.                           - Await pathology results.                           - Will decide next steps based on pathology.                           - The findings and recommendations were discussed                            with the patient.                           - The findings and recommendations were discussed                            with the patient's family. Procedure Code(s):        --- Professional ---                           [phone], Colonoscopy, flexible; with removal of                            tumor(s), polyp(s), or other lesion(s) by snare                            technique                           [id number], Colonoscopy, flexible; with directed                            submucosal injection(s), any substance                           17616, 59, Colonoscopy, flexible; with biopsy,                            single or multiple                           45399, Unlisted procedure, colon Diagnosis Code(s):        --- Professional ---                           K64.1, Second  degree hemorrhoids                           K57.30, Diverticulosis of large  intestine without                            perforation or abscess without bleeding                           K63.5, Polyp of colon                           D49.0, Neoplasm of unspecified behavior of                            digestive system                           K52.9, Noninfective gastroenteritis and colitis,                            unspecified                           K56.699, Other intestinal obstruction unspecified                            as to partial versus complete obstruction                           K92.1, Melena (includes Hematochezia) CPT copyright 2019 [employer]. All rights reserved. The codes documented in this report are preliminary and upon coder review may  be revised to meet current compliance requirements. Justice Britain, MD 10/26/2019 11:57:56 AM Number of Addenda: 0

## 2019-10-27 ENCOUNTER — Encounter (HOSPITAL_COMMUNITY): Payer: Self-pay | Admitting: Gastroenterology

## 2019-10-27 DIAGNOSIS — K922 Gastrointestinal hemorrhage, unspecified: Secondary | ICD-10-CM | POA: Diagnosis not present

## 2019-10-27 DIAGNOSIS — K5731 Diverticulosis of large intestine without perforation or abscess with bleeding: Secondary | ICD-10-CM

## 2019-10-27 DIAGNOSIS — K529 Noninfective gastroenteritis and colitis, unspecified: Secondary | ICD-10-CM

## 2019-10-27 LAB — BASIC METABOLIC PANEL
Anion gap: 11 (ref 5–15)
BUN: 14 mg/dL (ref 8–23)
CO2: 24 mmol/L (ref 22–32)
Calcium: 7.6 mg/dL — ABNORMAL LOW (ref 8.9–10.3)
Chloride: 107 mmol/L (ref 98–111)
Creatinine, Ser: 1.17 mg/dL — ABNORMAL HIGH (ref 0.44–1.00)
GFR calc Af Amer: 48 mL/min — ABNORMAL LOW (ref >60)
GFR calc non Af Amer: 41 mL/min — ABNORMAL LOW (ref >60)
Glucose, Bld: 107 mg/dL — ABNORMAL HIGH (ref 70–99)
Potassium: 3.3 mmol/L — ABNORMAL LOW (ref 3.5–5.1)
Sodium: 142 mmol/L (ref 135–145)

## 2019-10-27 LAB — GLUCOSE, CAPILLARY
Glucose-Capillary: 114 mg/dL — ABNORMAL HIGH (ref 70–99)
Glucose-Capillary: 127 mg/dL — ABNORMAL HIGH (ref 70–99)
Glucose-Capillary: 153 mg/dL — ABNORMAL HIGH (ref 70–99)
Glucose-Capillary: 157 mg/dL — ABNORMAL HIGH (ref 70–99)
Glucose-Capillary: 161 mg/dL — ABNORMAL HIGH (ref 70–99)
Glucose-Capillary: 98 mg/dL (ref 70–99)

## 2019-10-27 LAB — CBC
HCT: 25 % — ABNORMAL LOW (ref 36.0–46.0)
Hemoglobin: 8.1 g/dL — ABNORMAL LOW (ref 12.0–15.0)
MCH: 32.7 pg (ref 26.0–34.0)
MCHC: 32.4 g/dL (ref 30.0–36.0)
MCV: 100.8 fL — ABNORMAL HIGH (ref 80.0–100.0)
Platelets: 127 10*3/uL — ABNORMAL LOW (ref 150–400)
RBC: 2.48 MIL/uL — ABNORMAL LOW (ref 3.87–5.11)
RDW: 16.8 % — ABNORMAL HIGH (ref 11.5–15.5)
WBC: 3.6 10*3/uL — ABNORMAL LOW (ref 4.0–10.5)
nRBC: 0 % (ref 0.0–0.2)

## 2019-10-27 LAB — HEMOGLOBIN AND HEMATOCRIT, BLOOD
HCT: 27.8 % — ABNORMAL LOW (ref 36.0–46.0)
Hemoglobin: 8.9 g/dL — ABNORMAL LOW (ref 12.0–15.0)

## 2019-10-27 MED ORDER — POTASSIUM CHLORIDE CRYS ER 20 MEQ PO TBCR
40.0000 meq | EXTENDED_RELEASE_TABLET | Freq: Once | ORAL | Status: AC
  Administered 2019-10-27: 40 meq via ORAL
  Filled 2019-10-27: qty 2

## 2019-10-27 NOTE — Progress Notes (Signed)
PROGRESS NOTE    Whitney [name]  TAV:[id number] DOB: 06-Oct-1927 DOA: 10/24/2019 PCP: [name], MD   Chief Complaint  Patient presents with  . Rectal Bleeding    Patient states bloody stools that started last week.   Brief Narrative:  Whitney [name] is Whitney [name] 86 y.o. female with medical history significant for paroxysmal atrial fibrillation on Eliquis, breast cancer, type 2 diabetes mellitus, chronic diastolic CHF, and hypertension, now presenting to the emergency department for evaluation of rectal bleeding.  The patient reports that for the past week, she has had recurrent episodes of rectal bleeding without any abdominal pain, nausea, or vomiting.  Patient reports that she feels Mylee Falin sudden urge to defecate, sometimes waking her from sleep, and then sees red and maroon blood in the commode.  This is happened several times over the past week, her daughter witnessed an episode overnight that appeared to involve Kalijah Zeiss large volume of blood, and the patient was brought into the ED for evaluation.  ED Course: Upon arrival to the ED, patient is found to be afebrile, saturating well on room air, with normal heart rate, and stable blood pressure.  Chemistry panel is notable for creatinine of 1.41, similar to priors.  CBC features Edwards Mckelvie hemoglobin of 7.0, down from 9.4 in June 2021.  Fecal occult blood testing is positive.  COVID-19 screening test not yet collected.  Assessment & Plan:   Principal Problem:   Lower GI bleed Active Problems:   Essential hypertension, benign   Breast cancer of upper-outer quadrant of right female breast (HCC)   Chronic diastolic CHF (congestive heart failure), NYHA class 2 (HCC)   Controlled diabetes mellitus type 2 with complications (HCC)   Paroxysmal atrial fibrillation (HCC)   Chronic kidney disease, stage 3a   Anemia  1. Acute lower GI bleeding; Acute Blood Loss Anemia  - Presents with one week of painless hematochezia and is found to have Hgb of 7.0, down  from 9.4 in June 2021  - tagged RBC study with midline pelvic activity, favored bladder activity - s/p upper endoscopy and colonoscopy 8/12 (see below, follow biopsies) - s/p 2 units pRBC - trend Hb - transfuse for <7 or symptomatic - Continue to hold eliquis (per GI additional 48-72 hrs) - will need to discuss risk/benefits of resumption after her procedure depending on findings  - hopefully can d/c 8/14 or per GI  2. Paroxysmal atrial fibrillation  - CHADS-VASc at least 5 (age x2, gender, CHF, DM)  - Hold Eliquis for now, continue amiodarone and metoprolol as tolerated   3. CKD IIIb  - Creatinine appears closer to baseline today, will continue to monitor - Hold diuretics and ACE-i initially, renally-dose medications, repeat chem panel tomorrow   4. Chronic diastolic CHF  - Appears compensated, monitor weight and I/Os    5. Type II DM  - A1c was 6.1% in May 2021  - Check CBGs and use Natalya Domzalski low-intensity SSI for now    6. History of breast cancer  - Continue Tamoxifen   DVT prophylaxis: SCD Code Status: full  Family Communication: granddaughter at bedside Disposition:   Status is: Inpatient  Remains inpatient appropriate because:Inpatient level of care appropriate due to severity of illness   Dispo: The patient is from: Home              Anticipated d/c is to: pending              Anticipated d/c date is: 2 days  Patient currently is not medically stable to d/c.  Consultants:   GI   Procedures:  Colonoscopy Impression - Hemorrhoids found on digital rectal exam. - Stool in the entire examined colon. Lavaged with adequate visualization. - Two 4 to 10 mm polyps in the ascending colon and in the cecum, removed with Reizy Dunlow cold snare. Resected and retrieved. - Rule out malignancy, polypoid lesion in the transverse colon. Biopsied. Tattooed for marking purposes. Clip (MR conditional) was placed for marking purposes. - Patchy moderate inflammation was found in  the proximal sigmoid colon and in the distal descending colon secondary to colitis - proximal to the narrowing and sever diverticulosis noted below. Biopsied. - Severe diverticulosis in the recto-sigmoid colon, in the sigmoid colon and in the descending colon. There was narrowing of the colon in association with the diverticular opening. There was evidence of diverticular spasm. Peri-diverticular erythema was seen. There was no evidence of diverticular bleeding. - Stenosis in the recto-sigmoid colon in diverticular region that was congested with some concern for prolapsing of proximal tissue downwards that could lead to intussuception. Biopsied. - Non-bleeding non-thrombosed external and internal hemorrhoids. Recommendations - The patient will be observed post-procedure, until all discharge criteria are met. - Return patient to hospital ward for ongoing care. - Advance diet as tolerated. - Hold on restart of any NOAC for at least 48-72 hours. If anticoagulation is needed may consider IV Heparin in 12-hours (1000PM) without bolus. - Await pathology results. - Will decide next steps based on pathology. - The findings and recommendations were discussed with the patient. - The findings and recommendations were discussed with the patient's family.  Endoscopy Impression - Bleb found in the esophagus. - No gross lesions in esophagus. - Z-line regular, 38 cm from the incisors. - 4 cm hiatal hernia. - No gross lesions in the stomach. - No gross lesions in the duodenal bulb, in the first portion of the duodenum and in the second portion of the duodenum. - No source for GI bleeding found on today's EGD. Recommendations - Proceed to scheduled colonoscopy. - The findings and recommendations were discussed with the patient. - The findings and recommendations were discussed with the patient's family.  Antimicrobials Anti-infectives (From admission, onward)   None     Subjective: No  New  complaints Objective: Vitals:   10/26/19 1239 10/26/19 1946 10/27/19 0414 10/27/19 1220  BP: (!) 145/99 (!) 103/57 133/69 99/63  Pulse: 76 81 72 86  Resp: _0 Temp: (!) 97.4 F (36.3 C) 98 F (36.7 C) 97.9 F (36.6 C) 98.6 F (37 C)  TempSrc: Oral Oral Oral Oral  SpO2: 99% 98% 96% 100%  Weight:      Height:        Intake/Output Summary (Last 24 hours) at 10/27/2019 1614 Last data filed at 10/27/2019 0004 Gross per 24 hour  Intake 240 ml  Output -  Net 240 ml   Filed Weights   10/24/19 0457 10/24/19 0854  Weight: 73.9 kg 74.2 kg    Examination:  General: No acute distress. Cardiovascular: Heart sounds show Letesha Klecker regular rate, and rhythm. Lungs: Clear to auscultation bilaterally  Abdomen: Soft, nontender, nondistended Neurological: Alert and oriented 3. Moves all extremities 4 . Cranial nerves II through XII grossly intact. Skin: Warm and dry. No rashes or lesions. Extremities: No clubbing or cyanosis. No edema.  Data Reviewed: I have personally reviewed following labs and imaging studies  CBC: Recent Labs  Lab 10/24/19 0508 10/24/19 1747 10/25/19  0424 10/25/19 0941 10/26/19 0555 10/26/19 1403 10/26/19 2156 10/27/19 0540 10/27/19 1335  WBC 3.4*  --  3.8*  --  5.2  --   --  3.6*  --   NEUTROABS 2.6  --   --   --   --   --   --   --   --   HGB 7.0*   < > 8.1*   < > 8.6* 8.5* 7.4* 8.1* 8.9*  HCT 22.0*   < > 24.3*   < > 26.0* 26.6* 22.6* 25.0* 27.8*  MCV 104.8*  --  100.4*  --  99.6  --   --  100.8*  --   PLT 131*  --  118*  --  133*  --   --  127*  --    < > = values in this interval not displayed.    Basic Metabolic Panel: Recent Labs  Lab 10/24/19 0508 10/25/19 0424 10/26/19 0555 10/27/19 0540  NA 141 142 142 142  K 3.7 3.8 3.5 3.3*  CL 107 111 110 107  CO2 26 25 21* 24  GLUCOSE 190* 121* 127* 107*  BUN 27* _0 CREATININE 1.41* 1.25* 1.19* 1.17*  CALCIUM 8.0* 7.5* 7.8* 7.6*    GFR: Estimated Creatinine Clearance: 34.3 mL/min (Beth Spackman)  (by C-G formula based on SCr of 1.17 mg/dL (H)).  Liver Function Tests: Recent Labs  Lab 10/24/19 0508  AST 34  ALT 25  ALKPHOS 25*  BILITOT 0.5  PROT 5.2*  ALBUMIN 2.7*    CBG: Recent Labs  Lab 10/26/19 1951 10/26/19 2348 10/27/19 0417 10/27/19 0800 10/27/19 1214  GLUCAP 147* 78 114* 98 157*     Recent Results (from the past 240 hour(s))  SARS Coronavirus 2 by RT PCR (hospital order, performed in [facility] hospital lab) Nasopharyngeal Nasopharyngeal Swab     Status: None   Collection Time: 10/24/19  5:56 AM   Specimen: Nasopharyngeal Swab  Result Value Ref Range Status   SARS Coronavirus 2 NEGATIVE NEGATIVE Final    Comment: (NOTE) SARS-CoV-2 target nucleic acids are NOT DETECTED.  The SARS-CoV-2 RNA is generally detectable in upper and lower respiratory specimens during the acute phase of infection. The lowest concentration of SARS-CoV-2 viral copies this assay can detect is 250 copies / mL. Treyton Slimp negative result does not preclude SARS-CoV-2 infection and should not be used as the sole basis for treatment or other patient management decisions.  Tylee Newby negative result may occur with improper specimen collection / handling, submission of specimen other than nasopharyngeal swab, presence of viral mutation(s) within the areas targeted by this assay, and inadequate number of viral copies (<250 copies / mL). Marcile Fuquay negative result must be combined with clinical observations, patient history, and epidemiological information.  Fact Sheet for Patients:   StrictlyIdeas.no  Fact Sheet for Healthcare Providers: BankingDealers.co.za  This test is not yet approved or  cleared by the [place] FDA and has been authorized for detection and/or diagnosis of SARS-CoV-2 by FDA under an Emergency Use Authorization (EUA).  This EUA will remain in effect (meaning this test can be used) for the duration of the COVID-19 declaration under Section  564(b)(1) of the Act, 21 U.S.C. section 360bbb-3(b)(1), unless the authorization is terminated or revoked sooner.  Performed at [facility], [place] [address], [place], [place] [address]          Radiology Studies: No results found.      Scheduled Meds: . amiodarone  200 mg Oral Daily  . atorvastatin  10 mg Oral Daily  . doxazosin  2 mg Oral Daily  . feeding supplement (ENSURE ENLIVE)  237 mL Oral BID BM  . insulin aspart  0-9 Units Subcutaneous Q4H  . metoprolol succinate  100 mg Oral Daily  . multivitamin with minerals  1 tablet Oral Daily  . sodium chloride flush  3 mL Intravenous Q12H  . tamoxifen  20 mg Oral Daily   Continuous Infusions: . sodium chloride       LOS: 2 days    Time spent: over 30 min    Fayrene Helper, MD [facility]   To contact the attending provider between 7A-7P or the covering provider during after hours 7P-7A, please log into the web site [email or web address] and access using universal [place] password for that web site. If you do not have the password, please call the hospital operator.  10/27/2019, 4:14 PM

## 2019-10-27 NOTE — Evaluation (Addendum)
Physical Therapy Evaluation Patient Details Name: Whitney [name] MRN: [id number] DOB: November 17, 1927 Today's Date: 10/27/2019   History of Present Illness  86 y.o. female with medical history significant for paroxysmal atrial fibrillation on Eliquis, breast cancer, type 2 diabetes mellitus, chronic diastolic CHF, and hypertension, now presenting to the emergency department for evaluation of rectal bleeding  Clinical Impression  Pt admitted with above diagnosis. Pt ambulated 7' with RW, no loss of balance, distance limited by fatigue. At baseline, pt walks with a cane independently and doesn't require assist for bathing/dressing.  Pt will likely not need f/u PT as she is mobilizing well. Pt currently with functional limitations due to the deficits listed below (see PT Problem List). Pt will benefit from skilled PT to increase their independence and safety with mobility to allow discharge to the venue listed below.       Follow Up Recommendations No PT follow up    Equipment Recommendations  None recommended by PT    Recommendations for Other Services       Precautions / Restrictions Precautions Precautions: Fall Precaution Comments: daughter reports pt has had ~2 falls in past year Restrictions Weight Bearing Restrictions: No      Mobility  Bed Mobility Overal bed mobility: Modified Independent             General bed mobility comments: HOB up, used rail  Transfers Overall transfer level: Needs assistance Equipment used: Rolling walker (2 wheeled) Transfers: Sit to/from Stand Sit to Stand: Min assist         General transfer comment: min A to rise, VCs hand placement  Ambulation/Gait Ambulation/Gait assistance: Min guard Gait Distance (Feet): 90 Feet Assistive device: Rolling walker (2 wheeled) Gait Pattern/deviations: Step-through pattern Gait velocity: WFL   General Gait Details: steady, no loss of balance, distance limited by fatigue  Stairs             Wheelchair Mobility    Modified Rankin (Stroke Patients Only)       Balance Overall balance assessment: Modified Independent                                           Pertinent Vitals/Pain Pain Assessment: No/denies pain    Home Living Family/patient expects to be discharged to:: Private residence Living Arrangements: Children Available Help at Discharge: Family;Available 24 hours/day   Home Access: Level entry     Home Layout: One level Home Equipment: Cane - single point;Cane - quad;Bedside commode;Shower seat      Prior Function Level of Independence: Independent with assistive device(s)         Comments: doesn't drive, independent bathing/dressing, walks with cane     Hand Dominance   Dominant Hand: Right    Extremity/Trunk Assessment   Upper Extremity Assessment Upper Extremity Assessment: Overall WFL for tasks assessed    Lower Extremity Assessment Lower Extremity Assessment: Overall WFL for tasks assessed    Cervical / Trunk Assessment Cervical / Trunk Assessment: Normal  Communication   Communication: HOH  Cognition Arousal/Alertness: Awake/alert Behavior During Therapy: WFL for tasks assessed/performed Overall Cognitive Status: Within Functional Limits for tasks assessed                                        General Comments  Exercises     Assessment/Plan    PT Assessment Patient needs continued PT services  PT Problem List Decreased activity tolerance;Decreased mobility       PT Treatment Interventions Gait training;Therapeutic exercise;Therapeutic activities;Patient/family education    PT Goals (Current goals can be found in the Care Plan section)  Acute Rehab PT Goals Patient Stated Goal: return home PT Goal Formulation: With patient/family Time For Goal Achievement: 11/10/19 Potential to Achieve Goals: Good    Frequency Min 3X/week   Barriers to discharge         Co-evaluation               AM-PAC PT "6 Clicks" Mobility  Outcome Measure Help needed turning from your back to your side while in a flat bed without using bedrails?: A Little Help needed moving from lying on your back to sitting on the side of a flat bed without using bedrails?: A Little Help needed moving to and from a bed to a chair (including a wheelchair)?: A Little Help needed standing up from a chair using your arms (e.g., wheelchair or bedside chair)?: A Little Help needed to walk in hospital room?: A Little Help needed climbing 3-5 steps with a railing? : A Lot 6 Click Score: 17    End of Session Equipment Utilized During Treatment: Gait belt Activity Tolerance: Patient tolerated treatment well Patient left: in chair;with call bell/phone within reach;with chair alarm set;with family/visitor present Nurse Communication: Mobility status PT Visit Diagnosis: Difficulty in walking, not elsewhere classified (R26.2)    Time: 0272-5366 PT Time Calculation (min) (ACUTE ONLY): 14 min   Charges:   PT Evaluation $PT Eval Low Complexity: 1 Low          Philomena Doheny PT 10/27/2019  Acute Rehabilitation Services Pager [phone] Office [phone]

## 2019-10-27 NOTE — Progress Notes (Addendum)
Progress Note        ASSESSMENT AND PLAN:   CC:  Rectal bleeding    # Painless hematochezia on Eliquis --Severe left sided diverticular disease with rectosigmoid stenosis.  These was some concern that bowel mucosa was prolapsing down through the stenosis (  intussusception ).There was left sided colitis and a 40 mm transverse colon polyp. No evidence for diverticular bleeding but still could have been.  --Awaiting colon biopsies. Rule out malignancy in large transverse colon polyp. --No BMs or bleeding today  # Acute on chronic macrocytic anemia  --Hgb improved overnight from 7.4 to 8.1 . Got 2 units of blood on 8/10.  --Apparently iron deficient in May. Started iron, and IDA resolved on labs in June. Folate and B12 this admit are normal.   # Thrombocytopenia, chronic intermittent --Platelets 127.  --Count generally run lower end of normal but have been lower this admission.  --No evidence for cirrhosis.  --No recent imaging, normal spleen on [place] in 2018  # Hx of AFIB ---Eliquis on hold-     10/26/19 colonoscopy -Hemorrhoids found on digital rectal exam. - Stool in the entire examined colon. Lavaged with adequate visualization. - Two 4 to 10 mm polyps in the ascending colon and in the cecum, removed with a cold snare. Resected and retrieved. - Rule out malignancy, polypoid lesion in the transverse colon. Biopsied. Tattooed for marking purposes. Clip (MR conditional) was placed for marking purposes. - Patchy moderate inflammation was found in the proximal sigmoid colon and in the distal descending colon secondary to colitis - proximal to the narrowing and sever diverticulosis noted below. Biopsied. - Severe diverticulosis in the recto-sigmoid colon, in the sigmoid colon and in the descending colon. There was narrowing of the colon in association with the diverticular opening. There was evidence of diverticular spasm. Peri-diverticular erythema was seen. There was no  evidence of diverticular bleeding. - Stenosis in the recto-sigmoid colon in diverticular region that was congested with some concern for prolapsing of proximal tissue downwards that could lead to intussuception. Biopsied. - Non-bleeding non-thrombosed external and internal hemorrhoids.   SUBJECTIVE       OBJECTIVE:     Vital signs in last 24 hours: Temp:  [97.4 F (36.3 C)-98 F (36.7 C)] 97.9 F (36.6 C) (08/13 0414) Pulse Rate:  [72-92] 72 (08/13 0414) Resp:  [17-23] 19 (08/13 0414) BP: (103-152)/(57-100) 133/69 (08/13 0414) SpO2:  [96 %-100 %] 96 % (08/13 0414) Last BM Date: 10/26/19 General:   Alert, in NAD Heart:  Regular rate, irreg rhythm.  No lower extremity edema   Pulm: Normal respiratory effort   Abdomen:  Soft,  nontender, nondistended.  Normal bowel sounds.          Neurologic:  Alert and  oriented,  grossly normal neurologically. Psych:  Pleasant, cooperative.  Normal mood and affect.   Intake/Output from previous day: 08/12 0701 - 08/13 0700 In: 740 [P.O.:240; I.V.:500] Out: -  Intake/Output this shift: No intake/output data recorded.  Lab Results: Recent Labs    10/25/19 0424 10/25/19 0941 10/26/19 0555 10/26/19 0555 10/26/19 1403 10/26/19 2156 10/27/19 0540  WBC 3.8*  --  5.2  --   --   --  3.6*  HGB 8.1*   < > 8.6*   < > 8.5* 7.4* 8.1*  HCT 24.3*   < > 26.0*   < > 26.6* 22.6* 25.0*  PLT 118*  --  133*  --   --   --  127*   < > = values in this interval not displayed.   BMET Recent Labs    10/25/19 0424 10/26/19 0555 10/27/19 0540  NA 142 142 142  K 3.8 3.5 3.3*  CL 111 110 107  CO2 25 21* 24  GLUCOSE 121* 127* 107*  BUN 21 16 14   CREATININE 1.25* 1.19* 1.17*  CALCIUM 7.5* 7.8* 7.6*      Principal Problem:   Lower GI bleed Active Problems:   Essential hypertension, benign   Breast cancer of upper-outer quadrant of right female breast (HCC)   Chronic diastolic CHF (congestive heart failure), NYHA class 2 (HCC)    Controlled diabetes mellitus type 2 with complications (HCC)   Paroxysmal atrial fibrillation (HCC)   Chronic kidney disease, stage 3a   Anemia     LOS: 2 days   Tye Savoy ,NP 10/27/2019, 10:11 AM

## 2019-10-27 NOTE — Care Management Important Message (Signed)
Important Message  Patient Details IM Letter given to the Patient Name: RAQUEL RACEY MRN: [id number] Date of Birth: 10-18-1927   Medicare Important Message Given:  Yes     [name] 10/27/2019, 11:00 AM

## 2019-10-28 DIAGNOSIS — K625 Hemorrhage of anus and rectum: Secondary | ICD-10-CM

## 2019-10-28 DIAGNOSIS — I5032 Chronic diastolic (congestive) heart failure: Secondary | ICD-10-CM | POA: Diagnosis not present

## 2019-10-28 DIAGNOSIS — D500 Iron deficiency anemia secondary to blood loss (chronic): Secondary | ICD-10-CM | POA: Diagnosis not present

## 2019-10-28 DIAGNOSIS — C50411 Malignant neoplasm of upper-outer quadrant of right female breast: Secondary | ICD-10-CM | POA: Diagnosis not present

## 2019-10-28 DIAGNOSIS — K5790 Diverticulosis of intestine, part unspecified, without perforation or abscess without bleeding: Secondary | ICD-10-CM

## 2019-10-28 DIAGNOSIS — K922 Gastrointestinal hemorrhage, unspecified: Secondary | ICD-10-CM | POA: Diagnosis not present

## 2019-10-28 LAB — GLUCOSE, CAPILLARY
Glucose-Capillary: 123 mg/dL — ABNORMAL HIGH (ref 70–99)
Glucose-Capillary: 135 mg/dL — ABNORMAL HIGH (ref 70–99)
Glucose-Capillary: 188 mg/dL — ABNORMAL HIGH (ref 70–99)
Glucose-Capillary: 133 mg/dL — ABNORMAL HIGH (ref 70–99)
Glucose-Capillary: 135 mg/dL — ABNORMAL HIGH (ref 70–99)

## 2019-10-28 LAB — CBC
HCT: 25.5 % — ABNORMAL LOW (ref 36.0–46.0)
Hemoglobin: 8.1 g/dL — ABNORMAL LOW (ref 12.0–15.0)
MCH: 32.4 pg (ref 26.0–34.0)
MCHC: 31.8 g/dL (ref 30.0–36.0)
MCV: 102 fL — ABNORMAL HIGH (ref 80.0–100.0)
Platelets: 142 10*3/uL — ABNORMAL LOW (ref 150–400)
RBC: 2.5 MIL/uL — ABNORMAL LOW (ref 3.87–5.11)
RDW: 16.5 % — ABNORMAL HIGH (ref 11.5–15.5)
WBC: 3.7 10*3/uL — ABNORMAL LOW (ref 4.0–10.5)
nRBC: 0 % (ref 0.0–0.2)

## 2019-10-28 LAB — BASIC METABOLIC PANEL
Anion gap: 6 (ref 5–15)
BUN: 18 mg/dL (ref 8–23)
CO2: 22 mmol/L (ref 22–32)
Calcium: 7.7 mg/dL — ABNORMAL LOW (ref 8.9–10.3)
Chloride: 106 mmol/L (ref 98–111)
Creatinine, Ser: 1.45 mg/dL — ABNORMAL HIGH (ref 0.44–1.00)
GFR calc Af Amer: 37 mL/min — ABNORMAL LOW (ref >60)
GFR calc non Af Amer: 32 mL/min — ABNORMAL LOW (ref >60)
Glucose, Bld: 140 mg/dL — ABNORMAL HIGH (ref 70–99)
Potassium: 3.6 mmol/L (ref 3.5–5.1)
Sodium: 134 mmol/L — ABNORMAL LOW (ref 135–145)

## 2019-10-28 MED ORDER — POLYETHYLENE GLYCOL 3350 17 G PO PACK
17.0000 g | PACK | Freq: Every day | ORAL | Status: DC
  Administered 2019-10-28 – 2019-10-30 (×3): 17 g via ORAL
  Filled 2019-10-28 (×3): qty 1

## 2019-10-28 MED ORDER — SODIUM CHLORIDE 0.9 % IV SOLN
510.0000 mg | Freq: Once | INTRAVENOUS | Status: AC
  Administered 2019-10-28: 510 mg via INTRAVENOUS
  Filled 2019-10-28: qty 510

## 2019-10-28 NOTE — Progress Notes (Signed)
[facility]                                                                              Patient Demographics  Whitney [name], is a 86 y.o. female, DOB - Aug 06, 1927, PPI:[id number]  Admit date - 10/24/2019   Admitting Physician A Whitney [name]., MD  Outpatient Primary MD for the patient is Whitney Frizzle, MD  Outpatient specialists:   LOS - 3  days   Medical records reviewed Whitney are as summarized below:    Chief Complaint  Patient presents with  . Rectal Bleeding    Patient states bloody stools that started last week.       Brief summary   Whitney [name], Whitney [name], Whitney [name], Whitney [name], Whitney [name], presented to ED with rectal bleeding.  Patient reported that for the past week, she has had recurrent episodes of rectal bleeding without any abdominal pain, nausea, or vomiting. Patient reported that she feels a sudden urge to defecate, sometimes waking her from sleep, Whitney then sees red Whitney maroon blood in the commode. This is happened several times over the past week, her daughter witnessed an episode prior to admission that appeared to involve a large volume of blood, Whitney the patient was brought into the ED for evaluation. ED Course:Upon arrival to the ED, patient is found to be afebrile, saturating well on room air, with normal heart rate, Whitney stable blood pressure. Chemistry panel is notable for creatinine of 1.41, similar to priors. CBC features a hemoglobin of 7.0, down from 9.4 in June 2021. Fecal occult blood testing is positive.    Assessment & Plan   Primary problem Acute lower GI bleed with acute blood loss anemia -Patient presented with 1 week of painless hematochezia, hemoglobin of 7.0, down from 9.4 in 08/2019 -EGD showed no gross lesions in the esophagus, 4 cm hiatal hernia, no source for GI  bleeding -Colonoscopy showed severe diverticulosis in the rectosigmoid colon, Whitney sigmoid colon Whitney descending colon, evidence of diverticular spasm -Tagged RBC scan showed activity in the midline of the pelvis, difficult to exclude rectal bleeding -Status post 2 units packed RBCs, transfuse for hemoglobin less than 7 -GI following, recommended to continue to hold [name], will follow recommendations regarding anticoagulation  Paroxysmal atrial fibrillation - CHADS-VASc at least 5 (age x2, gender, [name], DM)  -Continue amiodarone, metoprolol -Hold [name] secondary to #1  AKI on CKD stage IIIb -Creatinine baseline 1.2 -Creatinine 1.45 today, continue to hold diuretics Whitney ACE inhibitor, follow BMET in a.m.   Whitney [name] - Appears compensated, monitor weight Whitney I/Os   Diabetes [name] Whitney 2, - A1c was 6.1% in May 2021  -Continue sliding scale insulin  History of Whitney CA Continue tamoxifen   Code Status: Full CODE STATUS DVT Prophylaxis:  SCD's Family Communication: Discussed all imaging results, lab results, explained to the patient   Disposition Plan:     Status is: Inpatient  Remains inpatient appropriate because:Inpatient level of care appropriate due to severity of illness   Dispo: The patient is  from: Home              Anticipated d/c is to: Home              Anticipated d/c date is: 1 day              Patient currently is not medically stable to d/c.      Time Spent in minutes   25 minutes  Procedures:  EGD, colonoscopy  Consultants:   Gastroenterology  Antimicrobials:   Anti-infectives (From admission, onward)   None          Medications  Scheduled Meds: . amiodarone  200 mg Oral Daily  . atorvastatin  10 mg Oral Daily  . doxazosin  2 mg Oral Daily  . feeding supplement (ENSURE ENLIVE)  237 mL Oral BID BM  . insulin aspart  0-9 Units Subcutaneous Q4H  . metoprolol succinate  100 mg Oral Daily  . multivitamin with  minerals  1 tablet Oral Daily  . polyethylene glycol  17 g Oral Daily  . sodium chloride flush  3 mL Intravenous Q12H  . tamoxifen  20 mg Oral Daily   Continuous Infusions: . sodium chloride    . ferumoxytol 510 mg (10/28/19 1417)   PRN Meds:.sodium chloride, ondansetron **OR** ondansetron (ZOFRAN) IV, sodium chloride flush      Subjective:   Whitney [name] was seen Whitney examined today.  No acute complaints, no repeat bleeding.  No fevers or chills, no diarrhea. Patient denies dizziness, chest pain, shortness of breath, abdominal pain, N/V, new weakness, numbess, tingling. No acute events overnight.  No BM in 2 days.  Objective:   Vitals:   10/27/19 1220 10/27/19 2026 10/28/19 0518 10/28/19 1402  BP: 99/63 98/64 127/75 116/71  Pulse: 86 79 93 (!) 43  Resp: 20 14  18   Temp: 98.6 F (37 C) 98.2 F (36.8 C) 98.3 F (36.8 C) 98.1 F (36.7 C)  TempSrc: Oral Oral Oral Oral  SpO2: 100% 98% 98% 99%  Weight:      Height:        Intake/Output Summary (Last 24 hours) at 10/28/2019 1429 Last data filed at 10/28/2019 0930 Gross per 24 hour  Intake 240 ml  Output --  Net 240 ml     Wt Readings from Last 3 Encounters:  10/24/19 74.2 kg  09/01/19 73.9 kg  07/31/19 76.2 kg     Exam  General: Alert Whitney oriented x 3, NAD  Cardiovascular: S1 S2 auscultated, no murmurs, RRR  Respiratory: Clear to auscultation bilaterally, no wheezing, rales or rhonchi  Gastrointestinal: Soft, nontender, nondistended, + bowel sounds  Ext: no pedal edema bilaterally  Neuro: no new deficits  Musculoskeletal: No digital cyanosis, clubbing  Skin: No rashes  Psych: Normal affect Whitney demeanor, alert Whitney oriented x3    Data Reviewed:  I have personally reviewed following labs Whitney imaging studies  Micro Results Recent Results (from the past 240 hour(s))  SARS Coronavirus 2 by RT PCR (hospital order, performed in [place] hospital lab) Nasopharyngeal Nasopharyngeal Swab     Status:  None   Collection Time: 10/24/19  5:56 AM   Specimen: Nasopharyngeal Swab  Result Value Ref Range Status   SARS Coronavirus 2 NEGATIVE NEGATIVE Final    Comment: (NOTE) SARS-CoV-2 target nucleic acids are NOT DETECTED.  The SARS-CoV-2 RNA is generally detectable in upper Whitney lower respiratory specimens during the acute phase of infection. The lowest concentration of SARS-CoV-2 viral copies this assay can  detect is 250 copies / mL. A negative result does not preclude SARS-CoV-2 infection Whitney should not be used as the sole basis for treatment or other patient management decisions.  A negative result may occur with improper specimen collection / handling, submission of specimen other than nasopharyngeal swab, presence of viral mutation(s) within the areas targeted by this assay, Whitney inadequate number of viral copies (<250 copies / mL). A negative result must be combined with clinical observations, patient history, Whitney epidemiological information.  Fact Sheet for Patients:   StrictlyIdeas.no  Fact Sheet for Healthcare Providers: BankingDealers.co.za  This test is not yet approved or  cleared by the [place] FDA Whitney has been authorized for detection Whitney/or diagnosis of SARS-CoV-2 by FDA under an Emergency Use Authorization (EUA).  This EUA will remain in effect (meaning this test can be used) for the duration of the COVID-19 declaration under Section 564(b)(1) of the Act, 21 U.S.C. section 360bbb-3(b)(1), unless the authorization is terminated or revoked sooner.  Performed at [facility], [place] [address], [place], [place] [address]     Radiology Reports NM GI Blood Loss  Result Date: 10/24/2019 CLINICAL DATA:  Bloody stools EXAM: NUCLEAR MEDICINE GASTROINTESTINAL BLEEDING SCAN TECHNIQUE: Sequential abdominal images were obtained following intravenous administration of Tc-64m labeled red blood cells.  RADIOPHARMACEUTICALS:  22.0 mCi Tc-68m pertechnetate in-vitro labeled red cells. COMPARISON:  01/01/2016 CT FINDINGS: Activity is seen in the midline of the pelvis. I favor this is activity within the bladder although rectal activity is difficult to exclude. No other areas of radiotracer accumulation. IMPRESSION: Midline pelvic activity late. I favor this is bladder activity although it is difficult to exclude rectal bleeding. Electronically Signed   By: Rolm Baptise M.D.   On: 10/24/2019 17:12    Lab Data:  CBC: Recent Labs  Lab 10/24/19 0508 10/24/19 1747 10/25/19 0424 10/25/19 0941 10/26/19 0555 10/26/19 0555 10/26/19 1403 10/26/19 2156 10/27/19 0540 10/27/19 1335 10/28/19 0528  WBC 3.4*  --  3.8*  --  5.2  --   --   --  3.6*  --  3.7*  NEUTROABS 2.6  --   --   --   --   --   --   --   --   --   --   HGB 7.0*   < > 8.1*   < > 8.6*   < > 8.5* 7.4* 8.1* 8.9* 8.1*  HCT 22.0*   < > 24.3*   < > 26.0*   < > 26.6* 22.6* 25.0* 27.8* 25.5*  MCV 104.8*  --  100.4*  --  99.6  --   --   --  100.8*  --  102.0*  PLT 131*  --  118*  --  133*  --   --   --  127*  --  142*   < > = values in this interval not displayed.   Basic Metabolic Panel: Recent Labs  Lab 10/24/19 0508 10/25/19 0424 10/26/19 0555 10/27/19 0540 10/28/19 0528  NA 141 142 142 142 134*  K 3.7 3.8 3.5 3.3* 3.6  CL 107 111 110 107 106  CO2 26 25 21* 24 22  GLUCOSE 190* 121* 127* 107* 140*  BUN 27* 21 16 14 18   CREATININE 1.41* 1.25* 1.19* 1.17* 1.45*  CALCIUM 8.0* 7.5* 7.8* 7.6* 7.7*   GFR: Estimated Creatinine Clearance: 27.7 mL/min (A) (by C-G formula based on SCr of 1.45 mg/dL (H)). Liver Function Tests: Recent Labs  Lab  10/24/19 0508  AST 34  ALT 25  ALKPHOS 25*  BILITOT 0.5  PROT 5.2*  ALBUMIN 2.7*   No results for input(s): LIPASE, AMYLASE in the last 168 hours. No results for input(s): AMMONIA in the last 168 hours. Coagulation Profile: No results for input(s): INR, PROTIME in the last 168  hours. Cardiac Enzymes: No results for input(s): CKTOTAL, CKMB, CKMBINDEX, TROPONINI in the last 168 hours. BNP (last 3 results) No results for input(s): PROBNP in the last 8760 hours. HbA1C: No results for input(s): HGBA1C in the last 72 hours. CBG: Recent Labs  Lab 10/27/19 2112 10/27/19 2357 10/28/19 0455 10/28/19 0716 10/28/19 1121  GLUCAP 127* 153* 123* 135* 188*   Lipid Profile: No results for input(s): CHOL, HDL, LDLCALC, TRIG, CHOLHDL, LDLDIRECT in the last 72 hours. Thyroid Function Tests: No results for input(s): TSH, T4TOTAL, FREET4, T3FREE, THYROIDAB in the last 72 hours. Anemia Panel: No results for input(s): VITAMINB12, FOLATE, FERRITIN, TIBC, IRON, RETICCTPCT in the last 72 hours. Urine analysis:    Component Value Date/Time   COLORURINE STRAW (A) 07/05/2018 1945   APPEARANCEUR CLEAR 07/05/2018 1945   LABSPEC 1.006 07/05/2018 1945   PHURINE 7.0 07/05/2018 1945   GLUCOSEU NEGATIVE 07/05/2018 1945   GLUCOSEU NEGATIVE 11/24/2012 0831   HGBUR NEGATIVE 07/05/2018 1945   BILIRUBINUR NEGATIVE 07/05/2018 1945   BILIRUBINUR neg 09/19/2014 [place] 07/05/2018 1945   PROTEINUR NEGATIVE 07/05/2018 1945   UROBILINOGEN 0.2 09/19/2014 1605   UROBILINOGEN 0.2 02/24/2014 0024   NITRITE NEGATIVE 07/05/2018 1945   LEUKOCYTESUR NEGATIVE 07/05/2018 1945     Tehani Mersman M.D. [facility] 10/28/2019, 2:29 PM   Call night coverage person covering after 7pm

## 2019-10-28 NOTE — Progress Notes (Addendum)
Progress Note        ASSESSMENT AND PLAN:   CC:    Lower GI bleeding.   # Painless hematochezia on Eliquis --Severe left sided diverticular disease with rectosigmoid stenosis, left sided colitis, possibly prolapsing mucosa through stenosis causing the bleeding. A 40 mm transverse colon polyp.  -Biopsies still pending. Concerned about malignancy in polyp. If path not back over weekend then can be discharged and follow up outpatient. Please do not resume Eliquis yet.  --No BMs or bleeding in a couple of days Will give daily miralax, especially given the left sided stenosis found on colonoscopy.     # Acute on chronic macrocytic anemia ( treated in May for iron deficiency) --Got 2 units of blood on 8/10.  --Hgb down overnight from 8.9 to 8.1 but no bleeding so not overly concerned at this point. Monitor for now --B12, folate normal.  --Dose of IV iron ordered.  --Should still continue oral iron at home unless causes constipation which it apparently was not   # Thrombocytopenia, chronic intermittent --Platelets 142 - stable , improving.  --No evidence for cirrhosis.  --No recent imaging, normal spleen on [place] in 2018  # Hx of AFIB ---Eliquis on hold     10/26/19 colonoscopy -Hemorrhoids found on digital rectal exam. - Stool in the entire examined colon. Lavaged with adequate visualization. - Two 4 to 10 mm polyps in the ascending colon and in the cecum, removed with a cold snare. Resected and retrieved. - Rule out malignancy, polypoid lesion in the transverse colon. Biopsied. Tattooed for marking purposes. Clip (MR conditional) was placed for marking purposes. - Patchy moderate inflammation was found in the proximal sigmoid colon and in the distal descending colon secondary to colitis - proximal to the narrowing and sever diverticulosis noted below. Biopsied. - Severe diverticulosis in the recto-sigmoid colon, in the sigmoid colon and in the descending colon. There was  narrowing of the colon in association with the diverticular opening. There was evidence of diverticular spasm. Peri-diverticular erythema was seen. There was no evidence of diverticular bleeding. - Stenosis in the recto-sigmoid colon in diverticular region that was congested with some concern for prolapsing of proximal tissue downwards that could lead to intussuception. Biopsied. - Non-bleeding non-thrombosed external and internal hemorrhoids      SUBJECTIVE   Feels fine. Says she has been eating okay. No BM / bleeding in two days.     OBJECTIVE:     Vital signs in last 24 hours: Temp:  [98.2 F (36.8 C)-98.6 F (37 C)] 98.3 F (36.8 C) (08/14 0518) Pulse Rate:  [79-93] 93 (08/14 0518) Resp:  [14-20] 14 (08/13 2026) BP: (98-127)/(63-75) 127/75 (08/14 0518) SpO2:  [98 %-100 %] 98 % (08/14 0518) Last BM Date: 10/26/19 (per pt) General:   Alert, in NAD Heart:  Regular rate , irreg rhythm.    Pulm: Normal respiratory effort   Abdomen:  Soft,  nontender, nondistended.  Normal bowel sounds.          Neurologic:  Alert and  oriented,  grossly normal neurologically. Psych:  Pleasant, cooperative.  Normal mood and affect.   Intake/Output from previous day: No intake/output data recorded. Intake/Output this shift: No intake/output data recorded.  Lab Results: Recent Labs    10/26/19 0555 10/26/19 1403 10/27/19 0540 10/27/19 1335 10/28/19 0528  WBC 5.2  --  3.6*  --  3.7*  HGB 8.6*   < > 8.1* 8.9* 8.1*  HCT 26.0*   < >  25.0* 27.8* 25.5*  PLT 133*  --  127*  --  142*   < > = values in this interval not displayed.   BMET Recent Labs    10/26/19 0555 10/27/19 0540 10/28/19 0528  NA 142 142 134*  K 3.5 3.3* 3.6  CL 110 107 106  CO2 21* 24 22  GLUCOSE 127* 107* 140*  BUN 16 14 18   CREATININE 1.19* 1.17* 1.45*  CALCIUM 7.8* 7.6* 7.7*       Principal Problem:   Lower GI bleed Active Problems:   Essential hypertension, benign   Breast cancer of upper-outer  quadrant of right female breast (HCC)   Chronic diastolic CHF (congestive heart failure), NYHA class 2 (HCC)   Controlled diabetes mellitus type 2 with complications (HCC)   Paroxysmal atrial fibrillation (HCC)   Chronic kidney disease, stage 3a   Anemia     LOS: 3 days   Tye Savoy ,NP 10/28/2019, 9:22 AM

## 2019-10-28 NOTE — Plan of Care (Signed)
  Problem: Safety: Goal: Ability to remain free from injury will improve Outcome: Progressing   

## 2019-10-29 DIAGNOSIS — N1831 Chronic kidney disease, stage 3a: Secondary | ICD-10-CM | POA: Diagnosis not present

## 2019-10-29 DIAGNOSIS — K922 Gastrointestinal hemorrhage, unspecified: Secondary | ICD-10-CM | POA: Diagnosis not present

## 2019-10-29 DIAGNOSIS — I5032 Chronic diastolic (congestive) heart failure: Secondary | ICD-10-CM | POA: Diagnosis not present

## 2019-10-29 DIAGNOSIS — K56699 Other intestinal obstruction unspecified as to partial versus complete obstruction: Secondary | ICD-10-CM

## 2019-10-29 DIAGNOSIS — D500 Iron deficiency anemia secondary to blood loss (chronic): Secondary | ICD-10-CM | POA: Diagnosis not present

## 2019-10-29 LAB — GLUCOSE, CAPILLARY
Glucose-Capillary: 101 mg/dL — ABNORMAL HIGH (ref 70–99)
Glucose-Capillary: 115 mg/dL — ABNORMAL HIGH (ref 70–99)
Glucose-Capillary: 145 mg/dL — ABNORMAL HIGH (ref 70–99)
Glucose-Capillary: 145 mg/dL — ABNORMAL HIGH (ref 70–99)
Glucose-Capillary: 147 mg/dL — ABNORMAL HIGH (ref 70–99)
Glucose-Capillary: 147 mg/dL — ABNORMAL HIGH (ref 70–99)

## 2019-10-29 LAB — BASIC METABOLIC PANEL WITH GFR
Anion gap: 10 (ref 5–15)
BUN: 19 mg/dL (ref 8–23)
CO2: 23 mmol/L (ref 22–32)
Calcium: 8.4 mg/dL — ABNORMAL LOW (ref 8.9–10.3)
Chloride: 108 mmol/L (ref 98–111)
Creatinine, Ser: 1.44 mg/dL — ABNORMAL HIGH (ref 0.44–1.00)
GFR calc Af Amer: 37 mL/min — ABNORMAL LOW
GFR calc non Af Amer: 32 mL/min — ABNORMAL LOW
Glucose, Bld: 111 mg/dL — ABNORMAL HIGH (ref 70–99)
Potassium: 3.9 mmol/L (ref 3.5–5.1)
Sodium: 141 mmol/L (ref 135–145)

## 2019-10-29 LAB — CBC
HCT: 26.4 % — ABNORMAL LOW (ref 36.0–46.0)
Hemoglobin: 8.4 g/dL — ABNORMAL LOW (ref 12.0–15.0)
MCH: 33.1 pg (ref 26.0–34.0)
MCHC: 31.8 g/dL (ref 30.0–36.0)
MCV: 103.9 fL — ABNORMAL HIGH (ref 80.0–100.0)
Platelets: 155 10*3/uL (ref 150–400)
RBC: 2.54 MIL/uL — ABNORMAL LOW (ref 3.87–5.11)
RDW: 16.2 % — ABNORMAL HIGH (ref 11.5–15.5)
WBC: 3.5 10*3/uL — ABNORMAL LOW (ref 4.0–10.5)
nRBC: 0 % (ref 0.0–0.2)

## 2019-10-29 NOTE — Progress Notes (Signed)
Gastroenterology Inpatient Follow-up Note   PATIENT IDENTIFICATION  Whitney [name] is a 86 y.o. female with a pmh significant for atrial fibrillation (on anticoagulation), breast cancer, hypertension, hyperlipidemia, osteoporosis, hypothyroidism, diverticulosis.  The patient was admitted to the hospital with anemia and rectal bleeding for further evaluation. Hospital Day: 6  SUBJECTIVE  Unfortunately, the pathology has not returned.  I do not expect it any sooner than Monday or Tuesday at the soonest. Patient aware of this. Patient has no lasting or ill effects at this point in time. No abdominal pain. No fevers or chills. Patient is hopeful that she will be able to go home later today. The patient is eating and drinking everything is given to her. She is remained off her anticoagulation. She has not had any recurrence of bright red blood per rectum.   OBJECTIVE  Scheduled Inpatient Medications:  . amiodarone  200 mg Oral Daily  . atorvastatin  10 mg Oral Daily  . doxazosin  2 mg Oral Daily  . feeding supplement (ENSURE ENLIVE)  237 mL Oral BID BM  . insulin aspart  0-9 Units Subcutaneous Q4H  . metoprolol succinate  100 mg Oral Daily  . multivitamin with minerals  1 tablet Oral Daily  . polyethylene glycol  17 g Oral Daily  . sodium chloride flush  3 mL Intravenous Q12H  . tamoxifen  20 mg Oral Daily   Continuous Inpatient Infusions:  . sodium chloride     PRN Inpatient Medications: sodium chloride, ondansetron **OR** ondansetron (ZOFRAN) IV, sodium chloride flush   Physical Examination  Temp:  [97.7 F (36.5 C)-98.7 F (37.1 C)] 98.7 F (37.1 C) (08/15 1420) Pulse Rate:  [77-103] 103 (08/15 1420) Resp:  [15-19] 15 (08/15 1420) BP: (100-132)/(60-76) 132/76 (08/15 1420) SpO2:  [97 %-98 %] 97 % (08/15 1420) Temp (24hrs), Avg:98.2 F (36.8 C), Min:97.7 F (36.5 C), Max:98.7 F (37.1 C)  Weight: 74.2 kg GEN: NAD, appears stated age, doesn't appear chronically  ill, sitting up in bed eating lunch PSYCH: Cooperative, without pressured speech EYE: Conjunctivae pink, sclerae anicteric ENT: MMM CV: Irregularly irregular RESP: No audible wheezing GI: NABS, soft, nontender, nondistended, without rebound MSK/EXT: Lower extremity edema present SKIN: No jaundice NEURO:  Alert & Oriented x 3, no focal deficits   Review of Data   Laboratory Studies   Recent Labs  Lab 10/29/19 0558  NA 141  K 3.9  CL 108  CO2 23  BUN 19  CREATININE 1.44*  GLUCOSE 111*  CALCIUM 8.4*   Recent Labs  Lab 10/24/19 0508  AST 34  ALT 25  ALKPHOS 25*    Recent Labs  Lab 10/27/19 0540 10/27/19 1335 10/28/19 0528 10/28/19 0528 10/29/19 0558  WBC 3.6*  --  3.7*   < > 3.5*  HGB 8.1*   < > 8.1*   < > 8.4*  HCT 25.0*   < > 25.5*   < > 26.4*  PLT 127*  --  142*  --  155   < > = values in this interval not displayed.   No results for input(s): APTT, INR in the last 168 hours.  Imaging Studies  No results found.   ASSESSMENT  Ms. Dilley is a 86 y.o. female with a pmh significant for atrial fibrillation (on anticoagulation), breast cancer, hypertension, hyperlipidemia, osteoporosis, hypothyroidism, diverticulosis.  The patient was admitted to the hospital with anemia and rectal bleeding for further evaluation.  The patient is hemodynamically and clinically stable at this time.  She has not had any evidence of recurrent bleeding.  Biopsies are still pending.  Hopefully, they will be back on Monday or Tuesday. If the patient has no need to remain in the hospital from a medical standpoint, she certainly can be discharged and we can follow-up her pathology.  If cancer is found then neck steps will be evaluation thereafter.  If only precancerous polyp tissue was found on the biopsies then consideration for advanced resection attempt is worthwhile. She will need to be off anticoagulation for that. I recommend that we keep her off anticoagulation for at least 1 week  before reinitiation and monitoring of her blood counts closely with PCP and her primary GI. If we end up needing to do a large EMR resection of the polyp and certainly we can consider it. I still am not convinced completely that this is not a malignancy.  I am also not convinced that she does not have multiple reasons for bright red blood per rectum. The likelihood of having prolapse of the congested mucosa in the diverticular region is possible as a potential source for intermittent GI bleeding.  Also the ulcerative changes and colitis that were found proximally to the area need to be considered as well. We will see what the biopsies show and move on from there.   PLAN/RECOMMENDATIONS  Follow-up pathology when they return Patient should be off anticoagulation for at least 1 week understanding the risks that go along with that otherwise she can be on IV heparin while in-house if she remains in house Potential for EMR/advanced polyp resection if polyp tissue returned as noncancerous will be had (she will need to be off anticoagulation) Query whether her bleeding could be from the diverticular narrowed area and/or from the question colitis that she had on endoscopic evaluation No contraindication for discharge if felt indicated by medical service   Please page/call with questions or concerns.   Justice Britain, MD [facility] Office # [phone]    LOS: 4 days  Irving Copas  10/29/2019, 3:05 PM

## 2019-10-29 NOTE — Progress Notes (Signed)
[facility]                                                                              Patient Demographics  Whitney [name], is a 86 y.o. female, DOB - [birth year], MHD:[id number]  Admit date - 10/24/2019   Admitting Physician A Melven Sartorius., MD  Outpatient Primary MD for the patient is [name], MD  Outpatient specialists:   LOS - 4  days   Medical records reviewed and are as summarized below:    Chief Complaint  Patient presents with  . Rectal Bleeding    Patient states bloody stools that started last week.       Brief summary   Whitney Seely Mooreis a 86 y.o.femalewith medical history significant forparoxysmal atrial fibrillation on Eliquis, breast cancer, type 2 diabetes mellitus, chronic diastolic CHF, and hypertension, presented to ED with rectal bleeding.  Patient reported that for the past week, she has had recurrent episodes of rectal bleeding without any abdominal pain, nausea, or vomiting. Patient reported that she feels a sudden urge to defecate, sometimes waking her from sleep, and then sees red and maroon blood in the commode. This is happened several times over the past week, her daughter witnessed an episode prior to admission that appeared to involve a large volume of blood, and the patient was brought into the ED for evaluation. ED Course:Upon arrival to the ED, patient is found to be afebrile, saturating well on room air, with normal heart rate, and stable blood pressure. Chemistry panel is notable for creatinine of 1.41, similar to priors. CBC features a hemoglobin of 7.0, down from 9.4 in June 2021. Fecal occult blood testing is positive.    Assessment & Plan   Primary problem Acute lower GI bleed with acute blood loss anemia -Patient presented with 1 week of painless hematochezia, hemoglobin of 7.0, down from 9.4 in 08/2019 -EGD showed no gross lesions in the esophagus, 4 cm hiatal hernia, no source for GI  bleeding -Colonoscopy showed severe diverticulosis in the rectosigmoid colon, and sigmoid colon and descending colon, evidence of diverticular spasm -Tagged RBC scan showed activity in the midline of the pelvis, difficult to exclude rectal bleeding -Status post 2 units packed RBCs, transfuse for hemoglobin less than 7 -Hemoglobin currently stable, trending up, 8.4 -No further bleeding episodes.  Discussed with GI, recommended to hold Eliquis for 7 days, awaiting biopsies.    Paroxysmal atrial fibrillation - CHADS-VASc at least 5 (age x2, gender, CHF, DM)  -Continue amiodarone, metoprolol -Eliquis currently on hold  AKI on CKD stage IIIb -Creatinine baseline 1.2 -Creatinine plateaued at 1.4   Chronic diastolic CHF - Appears compensated, monitor weight and I/Os   Diabetes mellitus type 2, - A1c was 6.1% in May 2021  -Continue sliding scale insulin  History of breast CA Continue tamoxifen   Code Status: Full CODE STATUS DVT Prophylaxis:  SCD's Family Communication: Discussed all imaging results, lab results, explained to the patient and patient's granddaughter, Caryl Comes (car not working today, will pick up in am)  Disposition Plan:     Status is: Inpatient  Remains inpatient appropriate because:Inpatient level of  care appropriate due to severity of illness   Dispo: The patient is from: Home              Anticipated d/c is to: Home              Anticipated d/c date is: 1 day              Patient currently is not medically stable to d/c.Will dc in am    Time Spent in minutes   25 minutes  Procedures:  EGD, colonoscopy  Consultants:   Gastroenterology  Antimicrobials:   Anti-infectives (From admission, onward)   None         Medications  Scheduled Meds: . amiodarone  200 mg Oral Daily  . atorvastatin  10 mg Oral Daily  . doxazosin  2 mg Oral Daily  . feeding supplement (ENSURE ENLIVE)  237 mL Oral BID BM  . insulin aspart  0-9 Units Subcutaneous Q4H   . metoprolol succinate  100 mg Oral Daily  . multivitamin with minerals  1 tablet Oral Daily  . polyethylene glycol  17 g Oral Daily  . sodium chloride flush  3 mL Intravenous Q12H  . tamoxifen  20 mg Oral Daily   Continuous Infusions: . sodium chloride     PRN Meds:.sodium chloride, ondansetron **OR** ondansetron (ZOFRAN) IV, sodium chloride flush      Subjective:   Whitney [name] was seen and examined today.  No acute complaints, no repeat bleeding episodes.  Eliquis currently on hold.   Patient denies dizziness, chest pain, shortness of breath, abdominal pain, N/V, new weakness, numbess, tingling. No acute events overnight.   Objective:   Vitals:   10/28/19 0518 10/28/19 1402 10/28/19 2001 10/29/19 0444  BP: 127/75 116/71 100/60 130/71  Pulse: 93 (!) 43 80 77  Resp:  18 17 19   Temp: 98.3 F (36.8 C) 98.1 F (36.7 C) 98.3 F (36.8 C) 97.7 F (36.5 C)  TempSrc: Oral Oral Oral Oral  SpO2: 98% 99% 98% 97%  Weight:      Height:        Intake/Output Summary (Last 24 hours) at 10/29/2019 1204 Last data filed at 10/29/2019 1000 Gross per 24 hour  Intake 720 ml  Output --  Net 720 ml     Wt Readings from Last 3 Encounters:  10/24/19 74.2 kg  09/01/19 73.9 kg  07/31/19 76.2 kg    Physical Exam  General: Alert and oriented x 3, NAD  Cardiovascular: S1 S2 clear, RRR. No pedal edema b/l  Respiratory: CTAB, no wheezing, rales or rhonchi  Gastrointestinal: Soft, nontender, nondistended, NBS  Ext: no pedal edema bilaterally  Neuro: no new deficits  Musculoskeletal: No cyanosis, clubbing  Skin: No rashes  Psych: Normal affect and demeanor, alert and oriented x3    Data Reviewed:  I have personally reviewed following labs and imaging studies  Micro Results Recent Results (from the past 240 hour(s))  SARS Coronavirus 2 by RT PCR (hospital order, performed in [facility] hospital lab) Nasopharyngeal Nasopharyngeal Swab     Status: None   Collection Time:  10/24/19  5:56 AM   Specimen: Nasopharyngeal Swab  Result Value Ref Range Status   SARS Coronavirus 2 NEGATIVE NEGATIVE Final    Comment: (NOTE) SARS-CoV-2 target nucleic acids are NOT DETECTED.  The SARS-CoV-2 RNA is generally detectable in upper and lower respiratory specimens during the acute phase of infection. The lowest concentration of SARS-CoV-2 viral copies this assay can detect is  250 copies / mL. A negative result does not preclude SARS-CoV-2 infection and should not be used as the sole basis for treatment or other patient management decisions.  A negative result may occur with improper specimen collection / handling, submission of specimen other than nasopharyngeal swab, presence of viral mutation(s) within the areas targeted by this assay, and inadequate number of viral copies (<250 copies / mL). A negative result must be combined with clinical observations, patient history, and epidemiological information.  Fact Sheet for Patients:   StrictlyIdeas.no  Fact Sheet for Healthcare Providers: BankingDealers.co.za  This test is not yet approved or  cleared by the [place] FDA and has been authorized for detection and/or diagnosis of SARS-CoV-2 by FDA under an Emergency Use Authorization (EUA).  This EUA will remain in effect (meaning this test can be used) for the duration of the COVID-19 declaration under Section 564(b)(1) of the Act, 21 U.S.C. section 360bbb-3(b)(1), unless the authorization is terminated or revoked sooner.  Performed at [facility], [place] [address] [place], [place] [address]     Radiology Reports NM GI Blood Loss  Result Date: 10/24/2019 CLINICAL DATA:  Bloody stools EXAM: NUCLEAR MEDICINE GASTROINTESTINAL BLEEDING SCAN TECHNIQUE: Sequential abdominal images were obtained following intravenous administration of Tc-6m labeled red blood cells. RADIOPHARMACEUTICALS:  22.0 mCi  Tc-21m pertechnetate in-vitro labeled red cells. COMPARISON:  01/01/2016 CT FINDINGS: Activity is seen in the midline of the pelvis. I favor this is activity within the bladder although rectal activity is difficult to exclude. No other areas of radiotracer accumulation. IMPRESSION: Midline pelvic activity late. I favor this is bladder activity although it is difficult to exclude rectal bleeding. Electronically Signed   By: Rolm Baptise M.D.   On: 10/24/2019 17:12    Lab Data:  CBC: Recent Labs  Lab 10/24/19 0508 10/24/19 1747 10/25/19 0424 10/25/19 0941 10/26/19 0555 10/26/19 1403 10/26/19 2156 10/27/19 0540 10/27/19 1335 10/28/19 0528 10/29/19 0558  WBC 3.4*  --  3.8*  --  5.2  --   --  3.6*  --  3.7* 3.5*  NEUTROABS 2.6  --   --   --   --   --   --   --   --   --   --   HGB 7.0*   < > 8.1*   < > 8.6*   < > 7.4* 8.1* 8.9* 8.1* 8.4*  HCT 22.0*   < > 24.3*   < > 26.0*   < > 22.6* 25.0* 27.8* 25.5* 26.4*  MCV 104.8*  --  100.4*  --  99.6  --   --  100.8*  --  102.0* 103.9*  PLT 131*  --  118*  --  133*  --   --  127*  --  142* 155   < > = values in this interval not displayed.   Basic Metabolic Panel: Recent Labs  Lab 10/25/19 0424 10/26/19 0555 10/27/19 0540 10/28/19 0528 10/29/19 0558  NA 142 142 142 134* 141  K 3.8 3.5 3.3* 3.6 3.9  CL 111 110 107 106 108  CO2 25 21* 24 22 23   GLUCOSE 121* 127* 107* 140* 111*  BUN 21 16 14 18 19   CREATININE 1.25* 1.19* 1.17* 1.45* 1.44*  CALCIUM 7.5* 7.8* 7.6* 7.7* 8.4*   GFR: Estimated Creatinine Clearance: 27.8 mL/min (A) (by C-G formula based on SCr of 1.44 mg/dL (H)). Liver Function Tests: Recent Labs  Lab 10/24/19 0508  AST 34  ALT 25  ALKPHOS 25*  BILITOT 0.5  PROT 5.2*  ALBUMIN 2.7*   No results for input(s): LIPASE, AMYLASE in the last 168 hours. No results for input(s): AMMONIA in the last 168 hours. Coagulation Profile: No results for input(s): INR, PROTIME in the last 168 hours. Cardiac Enzymes: No results for  input(s): CKTOTAL, CKMB, CKMBINDEX, TROPONINI in the last 168 hours. BNP (last 3 results) No results for input(s): PROBNP in the last 8760 hours. HbA1C: No results for input(s): HGBA1C in the last 72 hours. CBG: Recent Labs  Lab 10/28/19 2004 10/29/19 0037 10/29/19 0446 10/29/19 0705 10/29/19 1127  GLUCAP 135* 147* 101* 115* 145*   Lipid Profile: No results for input(s): CHOL, HDL, LDLCALC, TRIG, CHOLHDL, LDLDIRECT in the last 72 hours. Thyroid Function Tests: No results for input(s): TSH, T4TOTAL, FREET4, T3FREE, THYROIDAB in the last 72 hours. Anemia Panel: No results for input(s): VITAMINB12, FOLATE, FERRITIN, TIBC, IRON, RETICCTPCT in the last 72 hours. Urine analysis:    Component Value Date/Time   COLORURINE STRAW (A) 07/05/2018 1945   APPEARANCEUR CLEAR 07/05/2018 1945   LABSPEC 1.006 07/05/2018 1945   PHURINE 7.0 07/05/2018 1945   GLUCOSEU NEGATIVE 07/05/2018 1945   GLUCOSEU NEGATIVE 11/24/2012 0831   HGBUR NEGATIVE 07/05/2018 1945   BILIRUBINUR NEGATIVE 07/05/2018 1945   BILIRUBINUR neg 09/19/2014 [place] 07/05/2018 1945   PROTEINUR NEGATIVE 07/05/2018 1945   UROBILINOGEN 0.2 09/19/2014 1605   UROBILINOGEN 0.2 02/24/2014 0024   NITRITE NEGATIVE 07/05/2018 1945   LEUKOCYTESUR NEGATIVE 07/05/2018 1945     Demarus Latterell M.D. [facility] 10/29/2019, 12:04 PM   Call night coverage person covering after 7pm

## 2019-10-30 DIAGNOSIS — N1831 Chronic kidney disease, stage 3a: Secondary | ICD-10-CM | POA: Diagnosis not present

## 2019-10-30 DIAGNOSIS — C50411 Malignant neoplasm of upper-outer quadrant of right female breast: Secondary | ICD-10-CM | POA: Diagnosis not present

## 2019-10-30 DIAGNOSIS — D500 Iron deficiency anemia secondary to blood loss (chronic): Secondary | ICD-10-CM | POA: Diagnosis not present

## 2019-10-30 DIAGNOSIS — K922 Gastrointestinal hemorrhage, unspecified: Secondary | ICD-10-CM | POA: Diagnosis not present

## 2019-10-30 LAB — BASIC METABOLIC PANEL
Anion gap: 7 (ref 5–15)
BUN: 21 mg/dL (ref 8–23)
CO2: 23 mmol/L (ref 22–32)
Calcium: 8.3 mg/dL — ABNORMAL LOW (ref 8.9–10.3)
Chloride: 110 mmol/L (ref 98–111)
Creatinine, Ser: 1.38 mg/dL — ABNORMAL HIGH (ref 0.44–1.00)
GFR calc Af Amer: 39 mL/min — ABNORMAL LOW (ref >60)
GFR calc non Af Amer: 34 mL/min — ABNORMAL LOW (ref >60)
Glucose, Bld: 124 mg/dL — ABNORMAL HIGH (ref 70–99)
Potassium: 4.1 mmol/L (ref 3.5–5.1)
Sodium: 140 mmol/L (ref 135–145)

## 2019-10-30 LAB — SURGICAL PATHOLOGY

## 2019-10-30 LAB — GLUCOSE, CAPILLARY
Glucose-Capillary: 115 mg/dL — ABNORMAL HIGH (ref 70–99)
Glucose-Capillary: 121 mg/dL — ABNORMAL HIGH (ref 70–99)
Glucose-Capillary: 141 mg/dL — ABNORMAL HIGH (ref 70–99)
Glucose-Capillary: 204 mg/dL — ABNORMAL HIGH (ref 70–99)

## 2019-10-30 LAB — CBC
HCT: 25.2 % — ABNORMAL LOW (ref 36.0–46.0)
Hemoglobin: 7.9 g/dL — ABNORMAL LOW (ref 12.0–15.0)
MCH: 32.2 pg (ref 26.0–34.0)
MCHC: 31.3 g/dL (ref 30.0–36.0)
MCV: 102.9 fL — ABNORMAL HIGH (ref 80.0–100.0)
Platelets: 150 10*3/uL (ref 150–400)
RBC: 2.45 MIL/uL — ABNORMAL LOW (ref 3.87–5.11)
RDW: 16.2 % — ABNORMAL HIGH (ref 11.5–15.5)
WBC: 3.3 10*3/uL — ABNORMAL LOW (ref 4.0–10.5)
nRBC: 0 % (ref 0.0–0.2)

## 2019-10-30 MED ORDER — POLYETHYLENE GLYCOL 3350 17 G PO PACK: 17.0000 g | PACK | Freq: Every day | ORAL | 0 refills | Status: AC

## 2019-10-30 MED ORDER — APIXABAN 5 MG PO TABS: 5.0000 mg | ORAL_TABLET | Freq: Two times a day (BID) | ORAL | 5 refills | Status: DC

## 2019-10-30 MED ORDER — ATORVASTATIN CALCIUM 20 MG PO TABS: 10.0000 mg | ORAL_TABLET | Freq: Every day | ORAL | Status: DC

## 2019-10-30 MED ORDER — DOXAZOSIN MESYLATE 4 MG PO TABS: 2.0000 mg | ORAL_TABLET | Freq: Every day | ORAL | Status: AC

## 2019-10-30 MED ORDER — FUROSEMIDE 40 MG PO TABS: 40.0000 mg | ORAL_TABLET | Freq: Every day | ORAL | 2 refills | Status: DC

## 2019-10-30 NOTE — Discharge Instructions (Signed)
Bleeding Disorder A bleeding disorder causes abnormal bleeding or bruising. There are many kinds of bleeding disorders. They develop when the blood does not clump together (clot) properly. Normally, when you are injured and you bleed, special blood cells (platelets) and certain blood proteins (clotting factors) form a gel-like plug (blood clot). The clot forms at the site of the injury to help stop the bleeding. The injured blood vessel also tightens (constricts) to help stop bleeding. When you cannot form blood clots, it may be hard to stop bleeding, and even mild injuries can cause serious bleeding. Bleeding can result in you not having enough red blood cells (anemia). Sudden and severe bleeding can cause a dangerous loss of blood. What are the causes? There are many causes of bleeding disorders. A bleeding disorder may result from conditions that cause:  Too few or abnormal platelets.  Too few or abnormal clotting factors.  Abnormal or weak blood vessels that bleed easily and do not constrict normally. Bleeding disorders may be passed from parent to child (inherited), or they may develop on their own (acquired). Acquired bleeding disorders are most common. They can affect platelets, clotting factors, or blood vessels. Examples of acquired bleeding disorders include:  Liver diseases that affect clotting factors.  Allergic or immune diseases that attack blood vessels.  Infections that damage blood vessels.  Lack (deficiency) of vitamin C or vitamin K.  Bone marrow cancers that decrease platelet production.  Immune system diseases that attack platelets.  An enlarged spleen that traps platelets.  Long-term (chronic) kidney disease that decreases platelet function.  Disseminated intravascular coagulation (DIC). This condition is caused by severe diseases and conditions that use up platelets and clotting factors, such as an overwhelming infection, severe injury, or cancer.  Cancer  treatments that damage bone marrow, where blood cells are formed.  Medicines that increase bleeding. These include blood thinners, aspirin, NSAIDs, some antibiotics, some heart medicines, and quinine. Examples of inherited bleeding disorders include:  Von Willebrand disease (VWD).  Hemophilia.  Hemorrhagic telangiectasia.  Ehlers-Danlos syndrome. What are the signs or symptoms? Easy bruising and bleeding are the most common signs of a bleeding disorder. Minor cuts may bleed for a long time, and lightly bumping your skin may cause large bruises from bleeding under the skin (hematomas). Other symptoms include:  Heavy menstrual bleeding.  Frequent nosebleeds.  Prolonged bleeding from gums after brushing or flossing.  Blood in the stool (feces).  Prolonged bleeding after a dental procedure or a blood draw.  Anemia. This may cause pale skin, weakness, and fatigue.  Red spots or purple blotches under the skin.  Joint pain and swelling. How is this diagnosed? This condition may be diagnosed based on:  Your symptoms.  Your medical history, including: ? What medicines you are taking. ? Any procedures you have had, including surgery, childbirth, and dental procedures.  Your family history of bleeding disorders.  A physical exam. In some cases, a bleeding disorder is discovered during routine blood testing. You may be referred to a blood specialist (hematologist) for more tests, such as:  Complete blood count (CBC). This checks red blood cell and platelet levels.  Peripheral smear. This examines blood cells under a microscope and checks for abnormal blood cells.  PT (prothrombin time) and PTT (partial thromboplastin time) tests. These measure clotting times and test for clotting factors.  Imaging tests, such as a CT scan. These check for internal bleeding.  Genetic tests. These check for abnormal genes that you inherited. How is this  treated? Treatment for a bleeding  disorder depends on the cause, and may include:  Treating the cause of acquired bleeding disorders, such as immune system disorders, infections, or diseases. Treating the cause may reduce or reverse bleeding problems.  Changing or stopping medicines you take.  Taking vitamin supplements.  Receiving one or more of the following through an IV (IV infusion): ? Clotting factors. ? Plasma. Plasma is the liquid part of the blood that helps move platelets and clotting factors throughout the body. ? Platelets. ? Red blood cells, if you have severe blood loss.  Taking hormone medicine (desmopressin acetate, DDAVP) that increases certain clotting factors associated with bleeding disorders. There is no cure for inherited disorders, but treatment may prevent or control bleeding. Follow these instructions at home: Medicines  Take over-the-counter and prescription medicines only as told by your health care provider.  Talk with your health care provider before you take any new medicines. Certain medicines may increase your risk for dangerous bleeding. These include: ? Over-the-counter medicines that contain aspirin. ? NSAIDs such as ibuprofen. Preventing falls Follow instructions from your health care provider about ways that you can help prevent falls and injuries at home. These may include:  Removing loose rugs, cords, and other tripping hazards from walkways.  Installing grab bars in bathrooms.  Using night-lights. General instructions   Tell all your health care providers, including your dentist, that you have a bleeding disorder. Make sure to tell providers before you have any procedure done, including dental cleanings.  Limit activities as told by your health care provider. Ask your health care provider what activities are safe for you. You may need to avoid activities that could increase your risk of injury or bruising, such as contact sports.  Brush your teeth using a soft  toothbrush.  Use an electric razor to shave instead of a blade.  Wear a medical alert bracelet that says that you have a bleeding disorder. This can help you get the treatment you need in case of emergency.  Keep all follow-up visits as told by your health care provider. This is important. Contact a health care provider if you have:  Any symptoms of a bleeding disorder. Get help right away if you have:  Bleeding that does not stop.  Sudden, severe bleeding. Summary  A bleeding disorder causes abnormal bleeding or bruising. There many kinds of bleeding disorders.  Bleeding disorders may be passed from parent to child (inherited) or may develop on their own (acquired).  Easy bruising and bleeding are the most common signs of a bleeding disorder.  Treatment for a bleeding disorder depends on the cause.  Talk with your health care provider about what medicines and activities are safe for you. This information is not intended to replace advice given to you by your health care provider. Make sure you discuss any questions you have with your health care provider. Document Revised: 06/22/2018 Document Reviewed: 01/08/2017 Elsevier Patient Education  [place].   Gastrointestinal Bleeding Gastrointestinal (GI) bleeding is bleeding somewhere along the digestive tract, between the mouth and the anus. This tract includes the mouth, esophagus, stomach, small intestine, large intestine, and anus. The large intestine is often called the colon. GI bleeding can be caused by various problems. The severity of these problems can range from mild to serious or even life-threatening. If you have GI bleeding, you may find blood in your stools (feces), you may have black stools, or you may vomit blood. If there is a  lot of bleeding, you may need to stay in the hospital. What are the causes? This condition may be caused by:  Inflammation, irritation, or swelling of the esophagus (esophagitis).  The esophagus is part of the body that moves food from your mouth to your stomach.  Swollen veins in the rectum (hemorrhoids).  Areas of painful tearing in the anus that are often caused by passing hard stool (anal fissures).  Pouches that form on the colon over time, with age, and may bleed a lot (diverticulosis).  Inflammation (diverticulitis) in areas with diverticulosis. This can cause pain, fever, and bloody stools, although bleeding may be mild.  Growths (polyps) or cancer. Colon cancer often starts out as precancerous polyps.  Gastritis and ulcers. With these, bleeding may come from the upper GI tract, near the stomach. What increases the risk? You are more likely to develop this condition if you:  Have an infection in your stomach from a type of bacteria called Helicobacter pylori.  Take certain medicines, such as: ? NSAIDs. ? Aspirin. ? Selective serotonin reuptake inhibitors (SSRIs). ? Steroids. ? Antiplatelet or anticoagulant medicines.  Smoke.  Drink alcohol. What are the signs or symptoms? Common symptoms of this condition include:  Bright red blood in your vomit, or vomit that looks like coffee grounds.  Bloody, black, or tarry stools. ? Bleeding from the lower GI tract will usually cause red or maroon blood in the stools. ? Bleeding from the upper GI tract may cause black, tarry stools that are often stronger smelling than usual. ? In certain cases, if the bleeding is fast enough, the stools may be red.  Pain or cramping in the abdomen. How is this diagnosed? This condition may be diagnosed based on:  Your medical history and a physical exam.  Various tests, such as: ? Blood tests. ? Stool tests. ? X-rays and other imaging tests. ? Esophagogastroduodenoscopy (EGD). In this test, a flexible, lighted tube is used to look at your esophagus, stomach, and small intestine. ? Colonoscopy. In this test, a flexible, lighted tube is used to look at your  colon. How is this treated? Treatment for this condition depends on the cause of the bleeding. For example:  For bleeding from the esophagus, stomach, small intestine, or colon, the health care provider doing your EGD or colonoscopy may be able to stop the bleeding as part of the procedure.  Inflammation or infection of the colon can be treated with medicines.  Certain rectal problems can be treated with creams, suppositories, or warm baths.  Medicines may be given to reduce acid in your stomach.  Surgery is sometimes needed.  Blood transfusions are sometimes needed if a lot of blood has been lost. If bleeding is mild, you may be allowed to go home. If there is a lot of bleeding, you will need to stay in the hospital for observation. Follow these instructions at home:   Take over-the-counter and prescription medicines only as told by your health care provider.  Eat foods that are high in fiber, such as beans, whole grains, and fresh fruits and vegetables. This will help to keep your stools soft. Eating 1-3 prunes each day works well for many people.  Drink enough fluid to keep your urine pale yellow.  Keep all follow-up visits as told by your health care provider. This is important. Contact a health care provider if:  Your symptoms do not improve. Get help right away if:  Your bleeding does not stop.  You feel light-headed  or you faint.  You feel weak.  You have severe cramps in your back or abdomen.  You pass large blood clots in your stool.  Your symptoms are getting worse.  You have chest pain or fast heartbeats. Summary  Gastrointestinal (GI) bleeding is bleeding somewhere along the digestive tract, between the mouth and anus. GI bleeding can be caused by various problems. The severity of these problems can range from mild to serious or even life-threatening.  Treatment for this condition depends on the cause of the bleeding.  Take over-the-counter and  prescription medicines only as told by your health care provider.  Keep all follow-up visits as told by your health care provider. This is important.  Get help right away if your bleeding increases, your symptoms are getting worse, or you have new symptoms. This information is not intended to replace advice given to you by your health care provider. Make sure you discuss any questions you have with your health care provider. Document Revised: 10/13/2017 Document Reviewed: 10/13/2017 Elsevier Patient Education  [place].   Hypertension, Adult Hypertension is another name for high blood pressure. High blood pressure forces your heart to work harder to pump blood. This can cause problems over time. There are two numbers in a blood pressure reading. There is a top number (systolic) over a bottom number (diastolic). It is best to have a blood pressure that is below 120/80. Healthy choices can help lower your blood pressure, or you may need medicine to help lower it. What are the causes? The cause of this condition is not known. Some conditions may be related to high blood pressure. What increases the risk?  Smoking.  Having type 2 diabetes mellitus, high cholesterol, or both.  Not getting enough exercise or physical activity.  Being overweight.  Having too much fat, sugar, calories, or salt (sodium) in your diet.  Drinking too much alcohol.  Having long-term (chronic) kidney disease.  Having a family history of high blood pressure.  Age. Risk increases with age.  Race. You may be at higher risk if you are African American.  Gender. Men are at higher risk than women before age 31. After age 54, women are at higher risk than men.  Having obstructive sleep apnea.  Stress. What are the signs or symptoms?  High blood pressure may not cause symptoms. Very high blood pressure (hypertensive crisis) may cause: ? Headache. ? Feelings of worry or nervousness  (anxiety). ? Shortness of breath. ? Nosebleed. ? A feeling of being sick to your stomach (nausea). ? Throwing up (vomiting). ? Changes in how you see. ? Very bad chest pain. ? Seizures. How is this treated?  This condition is treated by making healthy lifestyle changes, such as: ? Eating healthy foods. ? Exercising more. ? Drinking less alcohol.  Your health care provider may prescribe medicine if lifestyle changes are not enough to get your blood pressure under control, and if: ? Your top number is above 130. ? Your bottom number is above 80.  Your personal target blood pressure may vary. Follow these instructions at home: Eating and drinking   If told, follow the DASH eating plan. To follow this plan: ? Fill one half of your plate at each meal with fruits and vegetables. ? Fill one fourth of your plate at each meal with whole grains. Whole grains include whole-wheat pasta, brown rice, and whole-grain bread. ? Eat or drink low-fat dairy products, such as skim milk or low-fat yogurt. ?  Fill one fourth of your plate at each meal with low-fat (lean) proteins. Low-fat proteins include fish, chicken without skin, eggs, beans, and tofu. ? Avoid fatty meat, cured and processed meat, or chicken with skin. ? Avoid pre-made or processed food.  Eat less than 1,500 mg of salt each day.  Do not drink alcohol if: ? Your doctor tells you not to drink. ? You are pregnant, may be pregnant, or are planning to become pregnant.  If you drink alcohol: ? Limit how much you use to:  0-1 drink a day for women.  0-2 drinks a day for men. ? Be aware of how much alcohol is in your drink. In the [place] one drink equals one 12 oz bottle of beer (355 mL), one 5 oz glass of wine (148 mL), or one 1 oz glass of hard liquor (44 mL). Lifestyle   Work with your doctor to stay at a healthy weight or to lose weight. Ask your doctor what the best weight is for you.  Get at least 30 minutes of exercise  most days of the week. This may include walking, swimming, or biking.  Get at least 30 minutes of exercise that strengthens your muscles (resistance exercise) at least 3 days a week. This may include lifting weights or doing Pilates.  Do not use any products that contain nicotine or tobacco, such as cigarettes, e-cigarettes, and chewing tobacco. If you need help quitting, ask your doctor.  Check your blood pressure at home as told by your doctor.  Keep all follow-up visits as told by your doctor. This is important. Medicines  Take over-the-counter and prescription medicines only as told by your doctor. Follow directions carefully.  Do not skip doses of blood pressure medicine. The medicine does not work as well if you skip doses. Skipping doses also puts you at risk for problems.  Ask your doctor about side effects or reactions to medicines that you should watch for. Contact a doctor if you:  Think you are having a reaction to the medicine you are taking.  Have headaches that keep coming back (recurring).  Feel dizzy.  Have swelling in your ankles.  Have trouble with your vision. Get help right away if you:  Get a very bad headache.  Start to feel mixed up (confused).  Feel weak or numb.  Feel faint.  Have very bad pain in your: ? Chest. ? Belly (abdomen).  Throw up more than once.  Have trouble breathing. Summary  Hypertension is another name for high blood pressure.  High blood pressure forces your heart to work harder to pump blood.  For most people, a normal blood pressure is less than 120/80.  Making healthy choices can help lower blood pressure. If your blood pressure does not get lower with healthy choices, you may need to take medicine. This information is not intended to replace advice given to you by your health care provider. Make sure you discuss any questions you have with your health care provider. Document Revised: 11/10/2017 Document Reviewed:  11/10/2017 Elsevier Patient Education  2020 [employer].

## 2019-10-30 NOTE — Progress Notes (Signed)
Patient ambulating to bathroom with walker and standby assist. Daughter at bedside.

## 2019-10-30 NOTE — Progress Notes (Signed)
Patient was instructed to put her light on when her granddaughter comes to pick her up, then I will go over her discharge instructions with her. The granddaughter lives with her. I went to see if the granddaughter had come and the patient was already gone. I called Sierra(the granddaughter) and explained what happen and she states she picked up the discharge papers in the room but hadn't read them yet. Told her after she reads them and if she has any questions to call the unit back and I gave her the number.

## 2019-10-30 NOTE — Discharge Summary (Signed)
Physician Discharge Summary   Patient ID: MELODI HAPPEL MRN: [id number] DOB/AGE: 10-31-1927 86 y.o.  Admit date: 10/24/2019 Discharge date: 10/30/2019  Primary Care Physician:  [name], MD   Recommendations for Outpatient Follow-up:  1. Follow up with PCP in 1-2 weeks 2. Please obtain BMP/CBC in one week  3. Eliquis placed on hold for 1 week, resume on 11/06/2019 if no bleeding episodes and CBC stable 4. Lasix decreased to 40 mg daily due to renal insufficiency, borderline BP, please adjust dose at the time of follow-up  Home Health: None, currently at baseline Equipment/Devices:   Discharge Condition: stable  CODE STATUS: FULL  Diet recommendation: Carb modified diet   Discharge      Acute blood loss anemia with lower GI bleed . Paroxysmal atrial fibrillation (HCC) . Controlled diabetes mellitus type 2 with complications ([place]) . Essential hypertension, benign . Chronic diastolic CHF (congestive heart failure), NYHA class 2 ([place]) . Breast cancer of upper-outer quadrant of right female breast ([place]) . AKI on chronic kidney disease, stage 3b .  Consults: Gastroenterology    Allergies:   Allergies  Allergen Reactions  . Tape Other (See Comments)    Skin is somewhat sensitive     DISCHARGE MEDICATIONS: Allergies as of 10/30/2019      Reactions   Tape Other (See Comments)   Skin is somewhat sensitive      Medication List    STOP taking these medications   lisinopril 20 MG tablet Commonly known as: ZESTRIL     TAKE these medications   amiodarone 200 MG tablet Commonly known as: PACERONE Take 1 tablet (200 mg total) by mouth daily.   apixaban 5 MG Tabs tablet Commonly known as: Eliquis Take 1 tablet (5 mg total) by mouth 2 (two) times daily. HOLD for 1 week Start taking on: November 06, 2019 What changed:   how much to take  additional instructions  These instructions start on November 06, 2019. If you are unsure what to do until then, ask  your doctor or other care provider.   atorvastatin 20 MG tablet Commonly known as: LIPITOR Take 0.5 tablets (10 mg total) by mouth daily.   cholecalciferol 25 MCG (1000 UNIT) tablet Commonly known as: VITAMIN D3 Take 1 tablet (1,000 Units total) by mouth daily.   doxazosin 4 MG tablet Commonly known as: CARDURA Take 0.5 tablets (2 mg total) by mouth daily.   ferrous sulfate 324 (65 Fe) MG Tbec Take 1 tablet (325 mg total) by mouth in the morning and at bedtime.   furosemide 40 MG tablet Commonly known as: LASIX Take 1 tablet (40 mg total) by mouth daily. Start taking on: October 31, 2019 What changed:   how much to take  when to take this   meclizine 12.5 MG tablet Commonly known as: ANTIVERT TAKE 1 TABLET BY MOUTH DAILY AS NEEDED FOR DIZZINESS What changed:   how much to take  how to take this  when to take this  reasons to take this   metoprolol succinate 100 MG 24 hr tablet Commonly known as: TOPROL-XL TAKE 1 TABLET BY MOUTH TWICE A DAY WITH OR IMMEDIATELY FOLLOWING A MEAL What changed: See the new instructions.   OneTouch Verio test strip Generic drug: glucose blood CHECK BLOOD SUGAR EVERY DAY AS DIRECTED   OneTouch Verio w/Device Kit Use to test blood sugars daily.   polyethylene glycol 17 g packet Commonly known as: MIRALAX / GLYCOLAX Take 17 g by mouth daily.  Also available OTC Start taking on: October 31, 2019   sitaGLIPtin 100 MG tablet Commonly known as: Januvia Take 0.5 tablets (50 mg total) by mouth daily. Take 1/2 tablet (40m total) by mouth once daily.   tamoxifen 20 MG tablet Commonly known as: NOLVADEX Take 1 tablet (20 mg total) by mouth daily.        Brief H and P: For complete details please refer to admission H and P, but in brief CRozlynn LippoldMooreis a 86y.o.femalewith medical history significant forparoxysmal atrial fibrillation on Eliquis, breast cancer, type 2 diabetes mellitus, chronic diastolic CHF, and hypertension,  presented to ED with rectal bleeding.  Patient reported that for the past week, she has had recurrent episodes of rectal bleeding without any abdominal pain, nausea, or vomiting. Patient reported that she feels a sudden urge to defecate, sometimes waking her from sleep, and then sees red and maroon blood in the commode. This is happened several times over the past week, her daughter witnessed an episode prior to admission that appeared to involve a large volume of blood, and the patient was brought into the ED for evaluation. ED Course:Upon arrival to the ED, patient is found to be afebrile, saturating well on room air, with normal heart rate, and stable blood pressure. Chemistry panel is notable for creatinine of 1.41, similar to priors. CBC features a hemoglobin of 7.0, down from 9.4 in June 2021. Fecal occult blood testing is positive.    Hospital Course:  Acute lower GI bleed with acute blood loss anemia -Patient presented with 1 week of painless hematochezia, hemoglobin of 7.0, down from 9.4 in 08/2019 -EGD showed no gross lesions in the esophagus, 4 cm hiatal hernia, no source for GI bleeding -Colonoscopy showed severe diverticulosis in the rectosigmoid colon, and sigmoid colon and descending colon, evidence of diverticular spasm -Tagged RBC scan showed activity in the midline of the pelvis, difficult to exclude rectal bleeding -Status post 2 units packed RBCs,No further bleeding episodes.  H&H currently stable, 7.9.  Discussed with GI, Dr. MRush Landmark recommended to hold Eliquis for 7 days, awaiting biopsies.    Okay to discharge home.  Patient will have follow-up with Dr. JArdis Hughs office will arrange appointment  Paroxysmal atrial fibrillation - CHADS-VASc at least 5 (age x2, gender, CHF, DM)  -Continue amiodarone, metoprolol -Eliquis currently on hold for 7 days  AKI on CKD stage IIIb -Creatinine baseline 1.2 -Creatinine 1.38, currently improving from 1.4.  Lisinopril  discontinued   Chronic diastolic CHF - Appears compensated, Lasix decreased to 40 mg daily, will need to adjust dose at the time of follow-up.  Diabetes mellitus type 2, - A1c was 6.1% in May 2021    History of breast CA Continue tamoxifen  Day of Discharge S: No acute complaints, no repeat bleeding episodes.  Hoping to go home today  BP 114/62   Pulse 99   Temp 97.8 F (36.6 C) (Oral)   Resp 14   Ht _0  (1.702 m)   Wt 74.2 kg   SpO2 96%   BMI 25.62 kg/m   Physical Exam: General: Alert and awake oriented x3 not in any acute distress. HEENT: anicteric sclera, pupils reactive to light and accommodation CVS: S1-S2 clear no murmur rubs or gallops Chest: clear to auscultation bilaterally, no wheezing rales or rhonchi Abdomen: soft nontender, nondistended, normal bowel sounds Extremities: no cyanosis, clubbing or edema noted bilaterally Neuro: no new deficits    Get Medicines reviewed and adjusted: Please take all your  medications with you for your next visit with your Primary MD  Please request your Primary MD to go over all hospital tests and procedure/radiological results at the follow up. Please ask your Primary MD to get all Hospital records sent to his/her office.  If you experience worsening of your admission symptoms, develop shortness of breath, life threatening emergency, suicidal or homicidal thoughts you must seek medical attention immediately by calling 911 or calling your MD immediately  if symptoms less severe.  You must read complete instructions/literature along with all the possible adverse reactions/side effects for all the Medicines you take and that have been prescribed to you. Take any new Medicines after you have completely understood and accept all the possible adverse reactions/side effects.   Do not drive when taking pain medications.   Do not take more than prescribed Pain, Sleep and Anxiety Medications  Special Instructions: If you have  smoked or chewed Tobacco  in the last 2 yrs please stop smoking, stop any regular Alcohol  and or any Recreational drug use.  Wear Seat belts while driving.  Please note  You were cared for by a hospitalist during your hospital stay. Once you are discharged, your primary care physician will handle any further medical issues. Please note that NO REFILLS for any discharge medications will be authorized once you are discharged, as it is imperative that you return to your primary care physician (or establish a relationship with a primary care physician if you do not have one) for your aftercare needs so that they can reassess your need for medications and monitor your lab values.   The results of significant diagnostics from this hospitalization (including imaging, microbiology, ancillary and laboratory) are listed below for reference.      Procedures/Studies:  NM GI Blood Loss  Result Date: 10/24/2019 CLINICAL DATA:  Bloody stools EXAM: NUCLEAR MEDICINE GASTROINTESTINAL BLEEDING SCAN TECHNIQUE: Sequential abdominal images were obtained following intravenous administration of Tc-8mlabeled red blood cells. RADIOPHARMACEUTICALS:  22.0 mCi Tc-976mertechnetate in-vitro labeled red cells. COMPARISON:  01/01/2016 CT FINDINGS: Activity is seen in the midline of the pelvis. I favor this is activity within the bladder although rectal activity is difficult to exclude. No other areas of radiotracer accumulation. IMPRESSION: Midline pelvic activity late. I favor this is bladder activity although it is difficult to exclude rectal bleeding. Electronically Signed   By: KeRolm Baptise.D.   On: 10/24/2019 17:12       LAB RESULTS: Basic Metabolic Panel: Recent Labs  Lab 10/29/19 0558 10/30/19 0524  NA 141 140  K 3.9 4.1  CL 108 110  CO2 23 23  GLUCOSE 111* 124*  BUN 19 21  CREATININE 1.44* 1.38*  CALCIUM 8.4* 8.3*   Liver Function Tests: Recent Labs  Lab 10/24/19 0508  AST 34  ALT 25   ALKPHOS 25*  BILITOT 0.5  PROT 5.2*  ALBUMIN 2.7*   No results for input(s): LIPASE, AMYLASE in the last 168 hours. No results for input(s): AMMONIA in the last 168 hours. CBC: Recent Labs  Lab 10/24/19 0508 10/24/19 1747 10/29/19 0558 10/29/19 0558 10/30/19 0524  WBC 3.4*   < > 3.5*  --  3.3*  NEUTROABS 2.6  --   --   --   --   HGB 7.0*   < > 8.4*  --  7.9*  HCT 22.0*   < > 26.4*  --  25.2*  MCV 104.8*   < > 103.9*   < > 102.9*  PLT 131*   < > 155  --  150   < > = values in this interval not displayed.   Cardiac Enzymes: No results for input(s): CKTOTAL, CKMB, CKMBINDEX, TROPONINI in the last 168 hours. BNP: Invalid input(s): POCBNP CBG: Recent Labs  Lab 10/30/19 0826 10/30/19 1204  GLUCAP 121* 204*       Disposition and Follow-up: Discharge Instructions    (HEART FAILURE PATIENTS) Call MD:  Anytime you have any of the following symptoms: 1) 3 pound weight gain in 24 hours or 5 pounds in 1 week 2) shortness of breath, with or without a dry hacking cough 3) swelling in the hands, feet or stomach 4) if you have to sleep on extra pillows at night in order to breathe.   Complete by: As directed    Call MD for:   Complete by: As directed    Discharge instructions   Complete by: As directed    Please have your primary care physician check CBC and BMET for blood counts, kidney function in 1 week.  Please follow-up with your cardiologist in the next 2 weeks.   Increase activity slowly   Complete by: As directed        DISPOSITION: Troy, [name], MD. Schedule an appointment as soon as possible for a visit in 10 day(s).   Specialty: Family Medicine Why: Please check CBC, BMET Contact information: [place] [address] [phone]        Milus Banister, MD Follow up.   Specialty: Gastroenterology Why: Office will call with appointment Contact information: [address] [place] [address] [phone]                Time coordinating discharge:  35 minutes  Signed:   Estill Cotta M.D. [facility] 10/30/2019, 12:23 PM

## 2019-10-30 NOTE — Care Management Important Message (Signed)
Important Message  Patient Details IM Letter given to the Patient Name: Whitney [name] MRN: [id number] Date of Birth: 03-24-1927   Medicare Important Message Given:  Yes     [name] 10/30/2019, 9:37 AM

## 2019-10-30 NOTE — Progress Notes (Signed)
Patient states she does not have a legal guardian.

## 2019-11-01 ENCOUNTER — Other Ambulatory Visit: Payer: Self-pay | Admitting: Endocrinology

## 2019-11-07 ENCOUNTER — Encounter: Payer: Self-pay | Admitting: Gastroenterology

## 2019-11-09 ENCOUNTER — Ambulatory Visit (INDEPENDENT_AMBULATORY_CARE_PROVIDER_SITE_OTHER): Payer: Medicare Other | Admitting: Family Medicine

## 2019-11-09 ENCOUNTER — Other Ambulatory Visit: Payer: Self-pay | Admitting: Family Medicine

## 2019-11-09 ENCOUNTER — Other Ambulatory Visit: Payer: Self-pay

## 2019-11-09 ENCOUNTER — Ambulatory Visit: Payer: Medicare Other | Admitting: Family Medicine

## 2019-11-09 VITALS — BP 120/70 | HR 93 | Temp 97.2°F | Ht 66.0 in | Wt 158.0 lb

## 2019-11-09 DIAGNOSIS — D500 Iron deficiency anemia secondary to blood loss (chronic): Secondary | ICD-10-CM

## 2019-11-09 DIAGNOSIS — N1832 Chronic kidney disease, stage 3b: Secondary | ICD-10-CM

## 2019-11-09 DIAGNOSIS — I480 Paroxysmal atrial fibrillation: Secondary | ICD-10-CM | POA: Diagnosis not present

## 2019-11-09 DIAGNOSIS — N289 Disorder of kidney and ureter, unspecified: Secondary | ICD-10-CM | POA: Diagnosis not present

## 2019-11-09 DIAGNOSIS — K922 Gastrointestinal hemorrhage, unspecified: Secondary | ICD-10-CM | POA: Diagnosis not present

## 2019-11-09 DIAGNOSIS — I5032 Chronic diastolic (congestive) heart failure: Secondary | ICD-10-CM

## 2019-11-09 NOTE — Progress Notes (Signed)
Subjective:    Patient ID: Whitney [name], female    DOB: 06-02-1927, 86 y.o.   MRN: [id number]  HPI  Patient recently admitted to the hospital.  I have copied relevant portions of the discharge summary below for my reference:   Brief H and P: For complete details please refer to admission H and P, but in brief Whitney [name] a 86 y.o.femalewith medical history significant forparoxysmal atrial fibrillation on Eliquis, breast cancer, type 2 diabetes mellitus, chronic diastolic CHF, and hypertension, presented to ED with rectal bleeding. Patient reported that for the past week, she has had recurrent episodes of rectal bleeding without any abdominal pain, nausea, or vomiting. Patient reported that she feels a sudden urge to defecate, sometimes waking her from sleep, and then sees red and maroon blood in the commode. This is happened several times over the past week, her daughter witnessed an episode prior to admission that appeared to involve a large volume of blood, and the patient was brought into the ED for evaluation. ED Course:Upon arrival to the ED, patient is found to be afebrile, saturating well on room air, with normal heart rate, and stable blood pressure. Chemistry panel is notable for creatinine of 1.41, similar to priors. CBC features a hemoglobin of 7.0, down from 9.4 in June 2021. Fecal occult blood testing is positive.    Hospital Course:  Acute lower GI bleed with acute blood loss anemia -Patient presented with 1 week of painless hematochezia, hemoglobin of 7.0, down from 9.4 in 08/2019 -EGD showed no gross lesions in the esophagus, 4 cm hiatal hernia, no source for GI bleeding -Colonoscopy showed severe diverticulosis in the rectosigmoid colon, and sigmoid colon and descending colon, evidence of diverticular spasm -Tagged RBC scan showed activity in the midline of the pelvis, difficult to exclude rectal bleeding -Status post 2 units packed RBCs,No further  bleeding episodes.  H&H currently stable, 7.9. Discussed with GI, [name], recommended to hold Eliquis for 7 days, awaiting biopsies.   Okay to discharge home.  Patient will have follow-up with [name], office will arrange appointment  Paroxysmal atrial fibrillation - CHADS-VASc at least 5 (age x2, gender, CHF, DM)  -Continue amiodarone, metoprolol -Eliquis currently on hold for 7 days  AKI on CKD stage IIIb -Creatinine baseline 1.2 -Creatinine 1.38, currently improving from 1.4.  Lisinopril discontinued   Chronic diastolic CHF - Appears compensated, Lasix decreased to 40 mg daily, will need to adjust dose at the time of follow-up.  Diabetes mellitus type 2, - A1c was 6.1% in May 2021    History of breast CA Continue tamoxifen  11/09/19 Reviewed the biopsy results from the colonoscopy performed in the hospital.  Patient had a tubular adenoma with high-grade dysplasia as well as active colitis.  GI has recommended a repeat colonoscopy to see if there is any focal invasion.  They also recommended repeat lab work including a CBC, iron studies, and a PT/INR.  I would be happy to schedule that at the present time.  The question is should the patient resume Eliquis.  CADS-VASc score is 5.  Therefore her annual risk of stroke is 7 to 8%.  Patient resumed Eliquis on August 23.  She has had one episode of a small amount of bright red blood.  She states that it is nowhere near as bad as when she went to the hospital.  She denies any chest pain.  She denies any shortness of breath.  She denies any syncope.  Today she is in atrial fibrillation however her rate is controlled.  She has +2 pitting edema in both legs to the knee.  There is no crackles in her lungs or signs of pulmonary edema Past Medical History:  Diagnosis Date  . Anemia   . Atrial fibrillation ([place])   . Breast cancer ([place]) 10/2013   right upper outer  . Cancer ([place])    right breast  . Complication of anesthesia      slow to wake up  . Diabetes mellitus without complication ([place])   . Dysrhythmia 10/15   AF  . Former smoker   . Full dentures   . Hyperlipidemia   . Hypertension   . Multinodular goiter   . Osteoporosis   . Radiation    Right Breast  . Thyroid disease    hypothyroidism  . Wears glasses    Past Surgical History:  Procedure Laterality Date  . ABDOMINAL HYSTERECTOMY    . AXILLARY LYMPH NODE DISSECTION Right 01/09/2014   Procedure: RIGHT AXILLARY LYMPH NODE DISECTION;  Surgeon: [name], MD;  Location: [facility];  Service: General;  Laterality: Right;  . BIOPSY  10/26/2019   Procedure: BIOPSY;  Surgeon: Rush Landmark Telford Nab., MD;  Location: [facility];  Service: Gastroenterology;;  . BREAST LUMPECTOMY WITH RADIOACTIVE SEED LOCALIZATION Right 01/09/2014   Procedure: RIGHT BREAST SEED LOCALIZED LUMPECTOMY ;  Surgeon: [name], MD;  Location: [facility];  Service: General;  Laterality: Right;  . COLONOSCOPY WITH PROPOFOL N/A 10/26/2019   Procedure: COLONOSCOPY WITH PROPOFOL;  Surgeon: Rush Landmark Telford Nab., MD;  Location: [facility];  Service: Gastroenterology;  Laterality: N/A;  . ESOPHAGOGASTRODUODENOSCOPY (EGD) WITH PROPOFOL N/A 10/26/2019   Procedure: ESOPHAGOGASTRODUODENOSCOPY (EGD) WITH PROPOFOL;  Surgeon: Rush Landmark Telford Nab., MD;  Location: [facility];  Service: Gastroenterology;  Laterality: N/A;  . EYE SURGERY  12/22/2009   cataracts  . HEMOSTASIS CLIP PLACEMENT  10/26/2019   Procedure: HEMOSTASIS CLIP PLACEMENT;  Surgeon: Irving Copas., MD;  Location: [facility];  Service: Gastroenterology;;  marking for transverse lesion  . INTRAMEDULLARY (IM) NAIL INTERTROCHANTERIC Left 02/24/2014   Procedure: IM ROD LEFT HIP FX;  Surgeon: Alta Corning, MD;  Location: [facility];  Service: Orthopedics;  Laterality: Left;  . left distal radius fracture     2020  . POLYPECTOMY  10/26/2019   Procedure: POLYPECTOMY;  Surgeon:  Mansouraty, Telford Nab., MD;  Location: [facility];  Service: Gastroenterology;;  . PORT-A-CATH REMOVAL Right 01/09/2014   Procedure: REMOVAL PORT-A-CATH;  Surgeon: [name], MD;  Location: [facility];  Service: General;  Laterality: Right;  . PORTACATH PLACEMENT N/A 11/15/2013   Procedure: INSERTION PORT-A-CATH WITH ULTRA SOUND AND FLOROSCOPY;  Surgeon: [name], MD;  Location: [facility];  Service: General;  Laterality: N/A;  . SUBMUCOSAL TATTOO INJECTION  10/26/2019   Procedure: SUBMUCOSAL TATTOO INJECTION;  Surgeon: Irving Copas., MD;  Location: [facility];  Service: Gastroenterology;;  . TOTAL KNEE ARTHROPLASTY  2002   left   Current Outpatient Medications on File Prior to Visit  Medication Sig Dispense Refill  . amiodarone (PACERONE) 200 MG tablet Take 1 tablet (200 mg total) by mouth daily. 90 tablet 3  . apixaban (ELIQUIS) 5 MG TABS tablet Take 1 tablet (5 mg total) by mouth 2 (two) times daily. HOLD for 1 week 60 tablet 5  . atorvastatin (LIPITOR) 20 MG tablet Take 0.5 tablets (10 mg total) by mouth daily.    . Blood Glucose Monitoring  Suppl (ONETOUCH VERIO) w/Device KIT Use to test blood sugars daily. 1 kit 0  . cholecalciferol (VITAMIN D3) 25 MCG (1000 UT) tablet Take 1 tablet (1,000 Units total) by mouth daily. 30 tablet 3  . doxazosin (CARDURA) 4 MG tablet Take 0.5 tablets (2 mg total) by mouth daily.    . ferrous sulfate 324 (65 Fe) MG TBEC Take 1 tablet (325 mg total) by mouth in the morning and at bedtime. 60 tablet 3  . furosemide (LASIX) 40 MG tablet Take 1 tablet (40 mg total) by mouth daily. 360 tablet 2  . meclizine (ANTIVERT) 12.5 MG tablet TAKE 1 TABLET BY MOUTH DAILY AS NEEDED FOR DIZZINESS (Patient taking differently: Take 12.5 mg by mouth daily as needed for dizziness. TAKE 1 TABLET BY MOUTH DAILY AS NEEDED FOR DIZZINESS) 30 tablet 2  . metoprolol succinate (TOPROL-XL) 100 MG 24 hr tablet TAKE 1 TABLET BY MOUTH TWICE A DAY WITH OR  IMMEDIATELY FOLLOWING A MEAL 180 tablet 1  . ONETOUCH VERIO test strip CHECK BLOOD SUGAR EVERY DAY AS DIRECTED 100 strip 2  . polyethylene glycol (MIRALAX / GLYCOLAX) 17 g packet Take 17 g by mouth daily. Also available OTC 30 each 0  . sitaGLIPtin (JANUVIA) 100 MG tablet Take 0.5 tablets (50 mg total) by mouth daily. Take 1/2 tablet (69m total) by mouth once daily. 90 tablet 1  . tamoxifen (NOLVADEX) 20 MG tablet Take 1 tablet (20 mg total) by mouth daily. 90 tablet 3   Current Facility-Administered Medications on File Prior to Visit  Medication Dose Route Frequency Provider Last Rate Last Admin  . denosumab (PROLIA) injection 60 mg  60 mg Subcutaneous Q6 months PSusy Frizzle MD   60 mg at 10/05/19 [id number]  Allergies  Allergen Reactions  . Tape Other (See Comments)    Skin is somewhat sensitive   Social History   Socioeconomic History  . Marital status: Widowed    Spouse name: Not on file  . Number of children: 4  . Years of education: Not on file  . Highest education level: Not on file  Occupational History  . Not on file  Tobacco Use  . Smoking status: Former Smoker    Packs/day: 1.00    Years: 5.00    Pack years: 5.00    Quit date: 11/14/1958    Years since quitting: 61.0  . Smokeless tobacco: Never Used  Vaping Use  . Vaping Use: Never used  Substance and Sexual Activity  . Alcohol use: No    Alcohol/week: 0.0 standard drinks  . Drug use: No  . Sexual activity: Never    Comment: menarche age 86 PP75 first birth age 86 no HRT, menopause age 89 Other Topics Concern  . Not on file  Social History Narrative  . Not on file   Social Determinants of Health   Financial Resource Strain:   . Difficulty of Paying Living Expenses: Not on file  Food Insecurity:   . Worried About RCharity fundraiserin the Last Year: Not on file  . Ran Out of Food in the Last Year: Not on file  Transportation Needs:   . Lack of Transportation (Medical): Not on file  . Lack of  Transportation (Non-Medical): Not on file  Physical Activity:   . Days of Exercise per Week: Not on file  . Minutes of Exercise per Session: Not on file  Stress:   . Feeling of Stress : Not on file  Social Connections:   .  Frequency of Communication with Friends and Family: Not on file  . Frequency of Social Gatherings with Friends and Family: Not on file  . Attends Religious Services: Not on file  . Active Member of Clubs or Organizations: Not on file  . Attends Archivist Meetings: Not on file  . Marital Status: Not on file  Intimate Partner Violence:   . Fear of Current or Ex-Partner: Not on file  . Emotionally Abused: Not on file  . Physically Abused: Not on file  . Sexually Abused: Not on file      Review of Systems  All other systems reviewed and are negative.      Objective:   Physical Exam Vitals reviewed.  Constitutional:      General: She is not in acute distress.    Appearance: Normal appearance. She is not ill-appearing or toxic-appearing.  Cardiovascular:     Rate and Rhythm: Normal rate. Rhythm irregular.     Heart sounds: Normal heart sounds.  Pulmonary:     Effort: Pulmonary effort is normal. No respiratory distress.     Breath sounds: Normal breath sounds. No wheezing, rhonchi or rales.  Chest:     Chest wall: No tenderness.  Abdominal:     General: Abdomen is flat. Bowel sounds are normal. There is no distension.     Palpations: Abdomen is soft.     Tenderness: There is no abdominal tenderness. There is no guarding or rebound.  Musculoskeletal:     Right lower leg: Edema present.     Left lower leg: Edema present.  Neurological:     Mental Status: She is alert.           Assessment & Plan:  Renal insufficiency - Plan: COMPLETE METABOLIC PANEL WITH GFR, Protime-INR, CANCELED: INR  Iron deficiency anemia due to chronic blood loss - Plan: Iron, TIBC and Ferritin Panel, CBC with Differential/Platelet, Protime-INR, CANCELED:  INR  Stage 3b chronic kidney disease  Lower GI bleed  Paroxysmal atrial fibrillation (HCC)  Chronic diastolic CHF (congestive heart failure) ([place])  My biggest concern is whether she should stay on Eliquis.  She has had one episode of bright red blood per rectum since resuming the medication.  Her annual stroke risk is between 7 and 8% based on her chads vas score.  Therefore if her hemoglobin is trending down again, I believe that we would need to discontinue the Eliquis as the risk outweighs the benefit.  If her hemoglobin is stable I would continue Eliquis unless she sees recurrent GI bleeding.  She also has significant swelling in her legs.  Her Lasix was decreased in the hospital due to renal insufficiency.  I will recheck a BMP today.  If her renal function has returned to her baseline I would recommend increasing her Lasix to 80 mg a day.  I will forward the lab work also to her gastroenterologist as well.

## 2019-11-10 LAB — COMPLETE METABOLIC PANEL WITH GFR
AG Ratio: 1.6 (calc) (ref 1.0–2.5)
ALT: 44 U/L — ABNORMAL HIGH (ref 6–29)
AST: 51 U/L — ABNORMAL HIGH (ref 10–35)
Albumin: 3.2 g/dL — ABNORMAL LOW (ref 3.6–5.1)
Alkaline phosphatase (APISO): 32 U/L — ABNORMAL LOW (ref 37–153)
BUN/Creatinine Ratio: 14 (calc) (ref 6–22)
BUN: 20 mg/dL (ref 7–25)
CO2: 27 mmol/L (ref 20–32)
Calcium: 8.2 mg/dL — ABNORMAL LOW (ref 8.6–10.4)
Chloride: 108 mmol/L (ref 98–110)
Creat: 1.42 mg/dL — ABNORMAL HIGH (ref 0.60–0.88)
GFR, Est African American: 38 mL/min/{1.73_m2} — ABNORMAL LOW (ref >=60)
GFR, Est Non African American: 33 mL/min/{1.73_m2} — ABNORMAL LOW (ref >=60)
Globulin: 2 g/dL (calc) (ref 1.9–3.7)
Glucose, Bld: 136 mg/dL — ABNORMAL HIGH (ref 65–99)
Potassium: 3.7 mmol/L (ref 3.5–5.3)
Sodium: 142 mmol/L (ref 135–146)
Total Bilirubin: 0.7 mg/dL (ref 0.2–1.2)
Total Protein: 5.2 g/dL — ABNORMAL LOW (ref 6.1–8.1)

## 2019-11-10 LAB — CBC WITH DIFFERENTIAL/PLATELET
Absolute Monocytes: 383 cells/uL (ref 200–950)
Basophils Absolute: 9 cells/uL (ref 0–200)
Basophils Relative: 0.2 %
Eosinophils Absolute: 30 cells/uL (ref 15–500)
Eosinophils Relative: 0.7 %
HCT: 28.3 % — ABNORMAL LOW (ref 35.0–45.0)
Hemoglobin: 9.2 g/dL — ABNORMAL LOW (ref 11.7–15.5)
Lymphs Abs: 585 cells/uL — ABNORMAL LOW (ref 850–3900)
MCH: 32.4 pg (ref 27.0–33.0)
MCHC: 32.5 g/dL (ref 32.0–36.0)
MCV: 99.6 fL (ref 80.0–100.0)
MPV: 10.3 fL (ref 7.5–12.5)
Monocytes Relative: 8.9 %
Neutro Abs: 3294 cells/uL (ref 1500–7800)
Neutrophils Relative %: 76.6 %
Platelets: 205 10*3/uL (ref 140–400)
RBC: 2.84 10*6/uL — ABNORMAL LOW (ref 3.80–5.10)
RDW: 14.5 % (ref 11.0–15.0)
Total Lymphocyte: 13.6 %
WBC: 4.3 10*3/uL (ref 3.8–10.8)

## 2019-11-10 LAB — IRON,TIBC AND FERRITIN PANEL
%SAT: 33 % (calc) (ref 16–45)
Ferritin: 338 ng/mL — ABNORMAL HIGH (ref 16–288)
Iron: 76 ug/dL (ref 45–160)
TIBC: 227 mcg/dL (calc) — ABNORMAL LOW (ref 250–450)

## 2019-11-10 LAB — PROTIME-INR
INR: 1.3 — ABNORMAL HIGH
Prothrombin Time: 13.3 s — ABNORMAL HIGH (ref 9.0–11.5)

## 2019-11-13 ENCOUNTER — Ambulatory Visit
Admission: RE | Admit: 2019-11-13 | Discharge: 2019-11-13 | Disposition: A | Discharge status: Home / Self Care | Payer: Medicare Other | Source: Ambulatory Visit | Attending: Hematology and Oncology | Admitting: Hematology and Oncology

## 2019-11-13 ENCOUNTER — Other Ambulatory Visit: Payer: Self-pay

## 2019-11-13 DIAGNOSIS — Z1231 Encounter for screening mammogram for malignant neoplasm of breast: Secondary | ICD-10-CM | POA: Diagnosis not present

## 2019-11-14 ENCOUNTER — Other Ambulatory Visit: Payer: Self-pay | Admitting: Family Medicine

## 2019-11-16 ENCOUNTER — Other Ambulatory Visit: Payer: Self-pay | Admitting: Family Medicine

## 2019-11-16 ENCOUNTER — Telehealth: Payer: Self-pay | Admitting: Family Medicine

## 2019-11-16 MED ORDER — VITAMIN D 25 MCG (1000 UNIT) PO TABS: 1000.0000 [IU] | ORAL_TABLET | Freq: Every day | ORAL | 3 refills | Status: DC

## 2019-11-16 NOTE — Telephone Encounter (Signed)
Granddaughter stopped by requesting a refill on Vitamin D3 called into [facility] on [address].  CB# [phone]

## 2019-11-16 NOTE — Telephone Encounter (Signed)
Prescription sent to pharmacy.

## 2019-11-16 NOTE — Progress Notes (Signed)
  Chronic Care Management   Note  11/16/2019 Name: [name] MRN: [id number] DOB: [birth year]  Whitney [name] is a 86 y.o. year old female who is a primary care patient of Dennard Schaumann, Cammie Mcgee, MD. I reached out to [name] by phone today in response to a referral sent by [name]'s PCP, Dennard Schaumann Cammie Mcgee, MD.   [name] was given information about Chronic Care Management services today including:  1. CCM service includes personalized support from designated clinical staff supervised by her physician, including individualized plan of care and coordination with other care providers 2. 24/7 contact phone numbers for assistance for urgent and routine care needs. 3. Service will only be billed when office clinical staff spend 20 minutes or more in a month to coordinate care. 4. Only one practitioner may furnish and bill the service in a calendar month. 5. The patient may stop CCM services at any time (effective at the end of the month) by phone call to the office staff.   Patient agreed to services and verbal consent obtained.   Follow up plan:   Carley Perdue [employer] Scheduler

## 2019-11-17 ENCOUNTER — Ambulatory Visit (INDEPENDENT_AMBULATORY_CARE_PROVIDER_SITE_OTHER): Payer: Medicare Other | Admitting: Family Medicine

## 2019-11-17 ENCOUNTER — Other Ambulatory Visit: Payer: Self-pay

## 2019-11-17 VITALS — BP 118/72 | HR 91 | Temp 97.9°F | Ht 66.0 in | Wt 163.0 lb

## 2019-11-17 DIAGNOSIS — N1832 Chronic kidney disease, stage 3b: Secondary | ICD-10-CM | POA: Diagnosis not present

## 2019-11-17 DIAGNOSIS — Z23 Encounter for immunization: Secondary | ICD-10-CM

## 2019-11-17 DIAGNOSIS — D500 Iron deficiency anemia secondary to blood loss (chronic): Secondary | ICD-10-CM | POA: Diagnosis not present

## 2019-11-17 NOTE — Progress Notes (Addendum)
Subjective:    Patient ID: Whitney [name], female    DOB: 06-02-1927, 86 y.o.   MRN: [id number]  HPI  Patient recently admitted to the hospital.  I have copied relevant portions of the discharge summary below for my reference:   Brief H and P: For complete details please refer to admission H and P, but in brief Whitney Capshaw Mooreis a 86 y.o.femalewith medical history significant forparoxysmal atrial fibrillation on Eliquis, breast cancer, type 2 diabetes mellitus, chronic diastolic CHF, and hypertension, presented to ED with rectal bleeding. Patient reported that for the past week, she has had recurrent episodes of rectal bleeding without any abdominal pain, nausea, or vomiting. Patient reported that she feels a sudden urge to defecate, sometimes waking her from sleep, and then sees red and maroon blood in the commode. This is happened several times over the past week, her daughter witnessed an episode prior to admission that appeared to involve a large volume of blood, and the patient was brought into the ED for evaluation. ED Course:Upon arrival to the ED, patient is found to be afebrile, saturating well on room air, with normal heart rate, and stable blood pressure. Chemistry panel is notable for creatinine of 1.41, similar to priors. CBC features a hemoglobin of 7.0, down from 9.4 in June 2021. Fecal occult blood testing is positive.    Hospital Course:  Acute lower GI bleed with acute blood loss anemia -Patient presented with 1 week of painless hematochezia, hemoglobin of 7.0, down from 9.4 in 08/2019 -EGD showed no gross lesions in the esophagus, 4 cm hiatal hernia, no source for GI bleeding -Colonoscopy showed severe diverticulosis in the rectosigmoid colon, and sigmoid colon and descending colon, evidence of diverticular spasm -Tagged RBC scan showed activity in the midline of the pelvis, difficult to exclude rectal bleeding -Status post 2 units packed RBCs,No further  bleeding episodes.  H&H currently stable, 7.9. Discussed with GI, [name], recommended to hold Eliquis for 7 days, awaiting biopsies.   Okay to discharge home.  Patient will have follow-up with [name], office will arrange appointment  Paroxysmal atrial fibrillation - CHADS-VASc at least 5 (age x2, gender, CHF, DM)  -Continue amiodarone, metoprolol -Eliquis currently on hold for 7 days  AKI on CKD stage IIIb -Creatinine baseline 1.2 -Creatinine 1.38, currently improving from 1.4.  Lisinopril discontinued   Chronic diastolic CHF - Appears compensated, Lasix decreased to 40 mg daily, will need to adjust dose at the time of follow-up.  Diabetes mellitus type 2, - A1c was 6.1% in May 2021    History of breast CA Continue tamoxifen  11/09/19 Reviewed the biopsy results from the colonoscopy performed in the hospital.  Patient had a tubular adenoma with high-grade dysplasia as well as active colitis.  GI has recommended a repeat colonoscopy to see if there is any focal invasion.  They also recommended repeat lab work including a CBC, iron studies, and a PT/INR.  I would be happy to schedule that at the present time.  The question is should the patient resume Eliquis.  CADS-VASc score is 5.  Therefore her annual risk of stroke is 7 to 8%.  Patient resumed Eliquis on August 23.  She has had one episode of a small amount of bright red blood.  She states that it is nowhere near as bad as when she went to the hospital.  She denies any chest pain.  She denies any shortness of breath.  She denies any syncope.  Today she is in atrial fibrillation however her rate is controlled.  She has +2 pitting edema in both legs to the knee.  There is no crackles in her lungs or signs of pulmonary edema.  At that time, my plan was: My biggest concern is whether she should stay on Eliquis.  She has had one episode of bright red blood per rectum since resuming the medication.  Her annual stroke risk is  between 7 and 8% based on her chads vas score.  Therefore if her hemoglobin is trending down again, I believe that we would need to discontinue the Eliquis as the risk outweighs the benefit.  If her hemoglobin is stable I would continue Eliquis unless she sees recurrent GI bleeding.  She also has significant swelling in her legs.  Her Lasix was decreased in the hospital due to renal insufficiency.  I will recheck a BMP today.  If her renal function has returned to her baseline I would recommend increasing her Lasix to 80 mg a day.  I will forward the lab work also to her gastroenterologist as well.  11/17/19 Since I last saw the patient she has gained 5 pounds.  She states that she has been taking Lasix 80 mg twice a day!.  I recommended that she only take 80 mg of Lasix once a day.  She has +1 swelling to the level of her knee bilaterally.  She denies any chest pain or shortness of breath.  She denies any dyspnea on exertion however she is sedentary.  She denies any further bright red blood per rectum.  She is consistently taking her Eliquis.  She denies any abdominal pain.  She denies any lightheadedness. Past Medical History:  Diagnosis Date  . Anemia   . Atrial fibrillation ([place])   . Breast cancer ([place]) 10/2013   right upper outer  . Cancer ([place])    right breast  . Complication of anesthesia    slow to wake up  . Diabetes mellitus without complication ([place])   . Dysrhythmia 10/15   AF  . Former smoker   . Full dentures   . Hyperlipidemia   . Hypertension   . Multinodular goiter   . Osteoporosis   . Radiation    Right Breast  . Thyroid disease    hypothyroidism  . Wears glasses    Past Surgical History:  Procedure Laterality Date  . ABDOMINAL HYSTERECTOMY    . AXILLARY LYMPH NODE DISSECTION Right 01/09/2014   Procedure: RIGHT AXILLARY LYMPH NODE DISECTION;  Surgeon: [name], MD;  Location: [facility];  Service: General;  Laterality: Right;  . BIOPSY  10/26/2019    Procedure: BIOPSY;  Surgeon: Rush Landmark Telford Nab., MD;  Location: [facility];  Service: Gastroenterology;;  . BREAST LUMPECTOMY WITH RADIOACTIVE SEED LOCALIZATION Right 01/09/2014   Procedure: RIGHT BREAST SEED LOCALIZED LUMPECTOMY ;  Surgeon: [name], MD;  Location: [facility];  Service: General;  Laterality: Right;  . COLONOSCOPY WITH PROPOFOL N/A 10/26/2019   Procedure: COLONOSCOPY WITH PROPOFOL;  Surgeon: Rush Landmark Telford Nab., MD;  Location: [facility];  Service: Gastroenterology;  Laterality: N/A;  . ESOPHAGOGASTRODUODENOSCOPY (EGD) WITH PROPOFOL N/A 10/26/2019   Procedure: ESOPHAGOGASTRODUODENOSCOPY (EGD) WITH PROPOFOL;  Surgeon: Rush Landmark Telford Nab., MD;  Location: [facility];  Service: Gastroenterology;  Laterality: N/A;  . EYE SURGERY  12/22/2009   cataracts  . HEMOSTASIS CLIP PLACEMENT  10/26/2019   Procedure: HEMOSTASIS CLIP PLACEMENT;  Surgeon: Irving Copas., MD;  Location: [facility];  Service: Gastroenterology;;  marking for transverse lesion  . INTRAMEDULLARY (IM) NAIL INTERTROCHANTERIC Left 02/24/2014   Procedure: IM ROD LEFT HIP FX;  Surgeon: Alta Corning, MD;  Location: [facility];  Service: Orthopedics;  Laterality: Left;  . left distal radius fracture     2020  . POLYPECTOMY  10/26/2019   Procedure: POLYPECTOMY;  Surgeon: Mansouraty, Telford Nab., MD;  Location: [facility];  Service: Gastroenterology;;  . PORT-A-CATH REMOVAL Right 01/09/2014   Procedure: REMOVAL PORT-A-CATH;  Surgeon: [name], MD;  Location: [facility];  Service: General;  Laterality: Right;  . PORTACATH PLACEMENT N/A 11/15/2013   Procedure: INSERTION PORT-A-CATH WITH ULTRA SOUND AND FLOROSCOPY;  Surgeon: [name], MD;  Location: [facility];  Service: General;  Laterality: N/A;  . SUBMUCOSAL TATTOO INJECTION  10/26/2019   Procedure: SUBMUCOSAL TATTOO INJECTION;  Surgeon: Irving Copas., MD;  Location: [facility];  Service:  Gastroenterology;;  . TOTAL KNEE ARTHROPLASTY  2002   left   Current Outpatient Medications on File Prior to Visit  Medication Sig Dispense Refill  . amiodarone (PACERONE) 200 MG tablet Take 1 tablet (200 mg total) by mouth daily. 90 tablet 3  . apixaban (ELIQUIS) 5 MG TABS tablet Take 1 tablet (5 mg total) by mouth 2 (two) times daily. HOLD for 1 week 60 tablet 5  . atorvastatin (LIPITOR) 20 MG tablet Take 0.5 tablets (10 mg total) by mouth daily.    . Blood Glucose Monitoring Suppl (ONETOUCH VERIO) w/Device KIT Use to test blood sugars daily. 1 kit 0  . doxazosin (CARDURA) 4 MG tablet Take 0.5 tablets (2 mg total) by mouth daily.    . ferrous sulfate 324 (65 Fe) MG TBEC TAKE 1 TABLET BY MOUTH IN THE MORNING AND 1 AT BEDTIME 180 tablet 1  . furosemide (LASIX) 40 MG tablet Take 1 tablet (40 mg total) by mouth daily. 360 tablet 2  . meclizine (ANTIVERT) 12.5 MG tablet TAKE 1 TABLET BY MOUTH DAILY AS NEEDED FOR DIZZINESS (Patient taking differently: Take 12.5 mg by mouth daily as needed for dizziness. TAKE 1 TABLET BY MOUTH DAILY AS NEEDED FOR DIZZINESS) 30 tablet 2  . metoprolol succinate (TOPROL-XL) 100 MG 24 hr tablet TAKE 1 TABLET BY MOUTH TWICE A DAY WITH OR IMMEDIATELY FOLLOWING A MEAL 180 tablet 1  . ONETOUCH VERIO test strip CHECK BLOOD SUGAR EVERY DAY AS DIRECTED 100 strip 2  . polyethylene glycol (MIRALAX / GLYCOLAX) 17 g packet Take 17 g by mouth daily. Also available OTC 30 each 0  . sitaGLIPtin (JANUVIA) 100 MG tablet Take 0.5 tablets (50 mg total) by mouth daily. Take 1/2 tablet (90m total) by mouth once daily. 90 tablet 1  . tamoxifen (NOLVADEX) 20 MG tablet Take 1 tablet (20 mg total) by mouth daily. 90 tablet 3  . Vitamin D3 (VITAMIN D) 25 MCG tablet TAKE 1 TABLET BY MOUTH EVERY DAY 180 tablet 2   Current Facility-Administered Medications on File Prior to Visit  Medication Dose Route Frequency Provider Last Rate Last Admin  . denosumab (PROLIA) injection 60 mg  60 mg  Subcutaneous Q6 months PSusy Frizzle MD   60 mg at 10/05/19 04235  Allergies  Allergen Reactions  . Tape Other (See Comments)    Skin is somewhat sensitive   Social History   Socioeconomic History  . Marital status: Widowed    Spouse name: Not on file  . Number of children: 4  . Years of education: Not  on file  . Highest education level: Not on file  Occupational History  . Not on file  Tobacco Use  . Smoking status: Former Smoker    Packs/day: 1.00    Years: 5.00    Pack years: 5.00    Quit date: 11/14/1958    Years since quitting: 61.0  . Smokeless tobacco: Never Used  Vaping Use  . Vaping Use: Never used  Substance and Sexual Activity  . Alcohol use: No    Alcohol/week: 0.0 standard drinks  . Drug use: No  . Sexual activity: Never    Comment: menarche age 77, P23, first birth age 28, no HRT, menopause age 35  Other Topics Concern  . Not on file  Social History Narrative  . Not on file   Social Determinants of Health   Financial Resource Strain:   . Difficulty of Paying Living Expenses: Not on file  Food Insecurity:   . Worried About Charity fundraiser in the Last Year: Not on file  . Ran Out of Food in the Last Year: Not on file  Transportation Needs:   . Lack of Transportation (Medical): Not on file  . Lack of Transportation (Non-Medical): Not on file  Physical Activity:   . Days of Exercise per Week: Not on file  . Minutes of Exercise per Session: Not on file  Stress:   . Feeling of Stress : Not on file  Social Connections:   . Frequency of Communication with Friends and Family: Not on file  . Frequency of Social Gatherings with Friends and Family: Not on file  . Attends Religious Services: Not on file  . Active Member of Clubs or Organizations: Not on file  . Attends Archivist Meetings: Not on file  . Marital Status: Not on file  Intimate Partner Violence:   . Fear of Current or Ex-Partner: Not on file  . Emotionally Abused: Not on  file  . Physically Abused: Not on file  . Sexually Abused: Not on file      Review of Systems  All other systems reviewed and are negative.      Objective:   Physical Exam Vitals reviewed.  Constitutional:      General: She is not in acute distress.    Appearance: Normal appearance. She is not ill-appearing or toxic-appearing.  Cardiovascular:     Rate and Rhythm: Normal rate. Rhythm irregular.     Heart sounds: Normal heart sounds.  Pulmonary:     Effort: Pulmonary effort is normal. No respiratory distress.     Breath sounds: Normal breath sounds. No wheezing, rhonchi or rales.  Chest:     Chest wall: No tenderness.  Abdominal:     General: Abdomen is flat. Bowel sounds are normal. There is no distension.     Palpations: Abdomen is soft.     Tenderness: There is no abdominal tenderness. There is no guarding or rebound.  Musculoskeletal:     Right lower leg: Edema present.     Left lower leg: Edema present.  Neurological:     Mental Status: She is alert.           Assessment & Plan:  Iron deficiency anemia due to chronic blood loss - Plan: CBC with Differential/Platelet, BASIC METABOLIC PANEL WITH GFR  Stage 3b chronic kidney disease - Plan: CBC with Differential/Platelet, BASIC METABOLIC PANEL WITH GFR  I recommended the patient only take Lasix 80 mg once a day.  Recheck a BMP to  monitor her renal function and potassium.  Check a CBC.  If her hemoglobin is stable this time, I will have the patient resume her regular appointment schedule unless she sees rectal bleeding.  Patient received her flu shot today.  However the patient is extremely weak.  She has a difficult time standing and walking to go to the restroom to use the toilet.  She also has a difficult time ambulating to the bathroom for bathing.  Her inability prevents her from performing her activities of daily living.  She is weak and deconditioned and has poor balance due to her anemia and frailty and  therefore is unstable using a cane or a walker.  Her home is large enough to accommodate a manual wheelchair.  Patient will be able to use the manual wheelchair to navigate her home to assist in moving around her home which will allow her to perform her activities of daily living such as bathing and toileting.  She is willing to use a wheelchair.

## 2019-11-18 LAB — CBC WITH DIFFERENTIAL/PLATELET
Absolute Monocytes: 340 cells/uL (ref 200–950)
Basophils Absolute: 32 cells/uL (ref 0–200)
Basophils Relative: 0.8 %
Eosinophils Absolute: 20 cells/uL (ref 15–500)
Eosinophils Relative: 0.5 %
HCT: 29.7 % — ABNORMAL LOW (ref 35.0–45.0)
Hemoglobin: 9.6 g/dL — ABNORMAL LOW (ref 11.7–15.5)
Lymphs Abs: 464 cells/uL — ABNORMAL LOW (ref 850–3900)
MCH: 33.1 pg — ABNORMAL HIGH (ref 27.0–33.0)
MCHC: 32.3 g/dL (ref 32.0–36.0)
MCV: 102.4 fL — ABNORMAL HIGH (ref 80.0–100.0)
MPV: 11.1 fL (ref 7.5–12.5)
Monocytes Relative: 8.5 %
Neutro Abs: 3144 cells/uL (ref 1500–7800)
Neutrophils Relative %: 78.6 %
Platelets: 183 10*3/uL (ref 140–400)
RBC: 2.9 10*6/uL — ABNORMAL LOW (ref 3.80–5.10)
RDW: 14.8 % (ref 11.0–15.0)
Total Lymphocyte: 11.6 %
WBC: 4 10*3/uL (ref 3.8–10.8)

## 2019-11-18 LAB — BASIC METABOLIC PANEL WITH GFR
BUN/Creatinine Ratio: 18 (calc) (ref 6–22)
BUN: 28 mg/dL — ABNORMAL HIGH (ref 7–25)
CO2: 26 mmol/L (ref 20–32)
Calcium: 8.5 mg/dL — ABNORMAL LOW (ref 8.6–10.4)
Chloride: 105 mmol/L (ref 98–110)
Creat: 1.6 mg/dL — ABNORMAL HIGH (ref 0.60–0.88)
GFR, Est African American: 33 mL/min/{1.73_m2} — ABNORMAL LOW (ref >=60)
GFR, Est Non African American: 28 mL/min/{1.73_m2} — ABNORMAL LOW (ref >=60)
Glucose, Bld: 201 mg/dL — ABNORMAL HIGH (ref 65–99)
Potassium: 3.8 mmol/L (ref 3.5–5.3)
Sodium: 142 mmol/L (ref 135–146)

## 2019-11-27 ENCOUNTER — Ambulatory Visit: Payer: Medicare Other | Admitting: Nurse Practitioner

## 2019-11-28 ENCOUNTER — Other Ambulatory Visit (INDEPENDENT_AMBULATORY_CARE_PROVIDER_SITE_OTHER): Payer: Medicare Other

## 2019-11-28 ENCOUNTER — Ambulatory Visit: Payer: Medicare Other | Admitting: Gastroenterology

## 2019-11-28 ENCOUNTER — Encounter: Payer: Self-pay | Admitting: Gastroenterology

## 2019-11-28 VITALS — BP 126/80 | HR 68 | Ht 66.0 in | Wt 135.8 lb

## 2019-11-28 DIAGNOSIS — K625 Hemorrhage of anus and rectum: Secondary | ICD-10-CM | POA: Diagnosis not present

## 2019-11-28 DIAGNOSIS — D62 Acute posthemorrhagic anemia: Secondary | ICD-10-CM

## 2019-11-28 DIAGNOSIS — K56699 Other intestinal obstruction unspecified as to partial versus complete obstruction: Secondary | ICD-10-CM

## 2019-11-28 DIAGNOSIS — K5790 Diverticulosis of intestine, part unspecified, without perforation or abscess without bleeding: Secondary | ICD-10-CM | POA: Diagnosis not present

## 2019-11-28 DIAGNOSIS — R933 Abnormal findings on diagnostic imaging of other parts of digestive tract: Secondary | ICD-10-CM

## 2019-11-28 DIAGNOSIS — K529 Noninfective gastroenteritis and colitis, unspecified: Secondary | ICD-10-CM

## 2019-11-28 DIAGNOSIS — Z860100 Personal history of colon polyps, unspecified: Secondary | ICD-10-CM

## 2019-11-28 LAB — CBC
HCT: 29.2 % — ABNORMAL LOW (ref 36.0–46.0)
Hemoglobin: 9.8 g/dL — ABNORMAL LOW (ref 12.0–15.0)
MCHC: 33.5 g/dL (ref 30.0–36.0)
MCV: 102.7 fl — ABNORMAL HIGH (ref 78.0–100.0)
Platelets: 165 10*3/uL (ref 150.0–400.0)
RBC: 2.84 Mil/uL — ABNORMAL LOW (ref 3.87–5.11)
RDW: 18.1 % — ABNORMAL HIGH (ref 11.5–15.5)
WBC: 4.6 10*3/uL (ref 4.0–10.5)

## 2019-11-28 MED ORDER — SUPREP BOWEL PREP KIT 17.5-3.13-1.6 GM/177ML PO SOLN
1.0000 | ORAL | 0 refills | Status: DC
Start: 2019-11-28 — End: 2020-03-11

## 2019-11-28 NOTE — Patient Instructions (Signed)
Your provider has requested that you go to the basement level for lab work before leaving today. Press "B" on the elevator. The lab is located at the first door on the left as you exit the elevator.  You have been scheduled for a colonoscopy. Please follow written instructions given to you at your visit today.  Please pick up your prep supplies at the pharmacy within the next 1-3 days. If you use inhalers (even only as needed), please bring them with you on the day of your procedure.   We have sent the following medications to your pharmacy for you to pick up at your convenience: Suprep   If you are age 84 or older, your body mass index should be between 23-30. Your Body mass index is 21.92 kg/m. If this is out of the aforementioned range listed, please consider follow up with your Primary Care Provider.  If you are age 43 or younger, your body mass index should be between 19-25. Your Body mass index is 21.92 kg/m. If this is out of the aformentioned range listed, please consider follow up with your Primary Care Provider.    Due to recent changes in healthcare laws, you may see the results of your imaging and laboratory studies on MyChart before your provider has had a chance to review them.  We understand that in some cases there may be results that are confusing or concerning to you. Not all laboratory results come back in the same time frame and the provider may be waiting for multiple results in order to interpret others.  Please give [place] 48 hours in order for your provider to thoroughly review all the results before contacting the office for clarification of your results.   You will be contaced by our office prior to your procedure for directions on holding your Eliquis .If you do not hear from our office 1 week prior to your scheduled procedure, please call [phone] to discuss.   Thank you for choosing me and [facility].  [name]

## 2019-11-29 ENCOUNTER — Telehealth: Payer: Self-pay

## 2019-11-29 NOTE — Telephone Encounter (Signed)
Clearance letter for Eliquis routed and faxed to Dr. Jenna Luo office-asking for clearance to hold Elquis x2 days prior to procedure.

## 2019-12-01 ENCOUNTER — Encounter: Payer: Self-pay | Admitting: Gastroenterology

## 2019-12-01 DIAGNOSIS — Z86010 Personal history of colonic polyps: Secondary | ICD-10-CM | POA: Insufficient documentation

## 2019-12-01 DIAGNOSIS — K529 Noninfective gastroenteritis and colitis, unspecified: Secondary | ICD-10-CM | POA: Insufficient documentation

## 2019-12-01 DIAGNOSIS — R933 Abnormal findings on diagnostic imaging of other parts of digestive tract: Secondary | ICD-10-CM | POA: Insufficient documentation

## 2019-12-01 DIAGNOSIS — K5790 Diverticulosis of intestine, part unspecified, without perforation or abscess without bleeding: Secondary | ICD-10-CM | POA: Insufficient documentation

## 2019-12-01 DIAGNOSIS — K56699 Other intestinal obstruction unspecified as to partial versus complete obstruction: Secondary | ICD-10-CM | POA: Insufficient documentation

## 2019-12-01 NOTE — Progress Notes (Signed)
[facility] VISIT   Primary Care Provider [name], MD [address] [place] [address] [phone]  Referring Provider Dr. Hilarie Fredrickson   Patient Profile: Whitney [name] is a 86 y.o. female with a pmh significant for atrial fibrillation (on anticoagulation), prior breast cancer, hypertension, hyperlipidemia, hypothyroidism, anemia, severe diverticulosis, colon polyps, transverse colon polyp with high-grade dysplasia concerning for potential focus of adenocarcinoma.  The patient presents to the [facility] for an evaluation and management of problem(s) noted below:  Problem List 1. History of colon polyps   2. Colitis, acute   3. Diverticulosis   4. Stenosis colon ([place])   5. Bright red blood per rectum   6. Acute blood loss anemia   7. Abnormal colonoscopy     History of Present Illness This is a patient whom the [facility], Dr. Hilarie Fredrickson, evaluated in the setting of GI bleeding, bright red blood per rectum, with diverticulosis.  She had not undergone a colonoscopy for many years.  An EGD/colonoscopy were performed by myself.  At the time of her colonoscopy multiple colon polyps were found and resected.  A significantly concerning lesion was found in the transverse colon and biopsied with findings of high-grade dysplasia and potential focus of adenocarcinoma although this could not be absolutely discerned due to the nature of the biopsies.  Colitis was also found on the colonoscopy in the proximal portion to a severe diverticular narrowing that I thought was a result of infolding prolapse.  I was only able to traverse this area with an ultraslim colonoscope.  The patient's pathology took many days to return and she was discharged from the hospital.  She has been restarted back on anticoagulation.  She is doing relatively well and has had repeat laboratories showing a stable to improving blood count.  She does still have  some episodes of bright red blood per rectum but they are much less than before and usually just on the toilet bowl.  The etiology of her bright red blood per rectum and anemia was not clearly defined as it could have been diverticular in origin or potentially from prolapse versus colitis versus potentially this ulcerated lesion in the transverse colon.  The patient is accompanied by her granddaughter today.  She has no other GI symptoms currently.  She is hopeful that this is not a cancer but understands that if it is that she needs and wants to have it taken care of if possible.  GI Review of Systems Positive as above Negative for dysphagia, odynophagia, pain, melena  Review of Systems General: Denies fevers/chills/weight loss unintentionally Cardiovascular: Denies chest pain/palpitations Pulmonary: Denies shortness of breath Gastroenterological: See HPI Genitourinary: Denies darkened urine or hematuria Hematological: Positive for easy bruising/bleeding due to anticoagulation Dermatological: Denies jaundice Psychological: Mood is anxious   Medications Current Outpatient Medications  Medication Sig Dispense Refill  . amiodarone (PACERONE) 200 MG tablet Take 1 tablet (200 mg total) by mouth daily. 90 tablet 3  . apixaban (ELIQUIS) 5 MG TABS tablet Take 1 tablet (5 mg total) by mouth 2 (two) times daily. HOLD for 1 week 60 tablet 5  . atorvastatin (LIPITOR) 20 MG tablet Take 0.5 tablets (10 mg total) by mouth daily.    . Blood Glucose Monitoring Suppl (ONETOUCH VERIO) w/Device KIT Use to test blood sugars daily. 1 kit 0  . doxazosin (CARDURA) 4 MG tablet Take 0.5 tablets (2 mg total) by mouth daily.    . ferrous  sulfate 324 (65 Fe) MG TBEC TAKE 1 TABLET BY MOUTH IN THE MORNING AND 1 AT BEDTIME 180 tablet 1  . furosemide (LASIX) 40 MG tablet Take 1 tablet (40 mg total) by mouth daily. (Patient taking differently: Take 80 mg by mouth daily. ) 360 tablet 2  . meclizine (ANTIVERT) 12.5 MG  tablet TAKE 1 TABLET BY MOUTH DAILY AS NEEDED FOR DIZZINESS (Patient taking differently: Take 12.5 mg by mouth daily as needed for dizziness. TAKE 1 TABLET BY MOUTH DAILY AS NEEDED FOR DIZZINESS) 30 tablet 2  . metoprolol succinate (TOPROL-XL) 100 MG 24 hr tablet TAKE 1 TABLET BY MOUTH TWICE A DAY WITH OR IMMEDIATELY FOLLOWING A MEAL 180 tablet 1  . ONETOUCH VERIO test strip CHECK BLOOD SUGAR EVERY DAY AS DIRECTED 100 strip 2  . polyethylene glycol (MIRALAX / GLYCOLAX) 17 g packet Take 17 g by mouth daily. Also available OTC 30 each 0  . sitaGLIPtin (JANUVIA) 100 MG tablet Take 0.5 tablets (50 mg total) by mouth daily. Take 1/2 tablet (18m total) by mouth once daily. 90 tablet 1  . tamoxifen (NOLVADEX) 20 MG tablet Take 1 tablet (20 mg total) by mouth daily. 90 tablet 3  . Vitamin D3 (VITAMIN D) 25 MCG tablet TAKE 1 TABLET BY MOUTH EVERY DAY 180 tablet 2  . Na Sulfate-K Sulfate-Mg Sulf (SUPREP BOWEL PREP KIT) 17.5-3.13-1.6 GM/177ML SOLN Take 1 kit by mouth as directed. For colonoscopy prep 354 mL 0   Current Facility-Administered Medications  Medication Dose Route Frequency Provider Last Rate Last Admin  . denosumab (PROLIA) injection 60 mg  60 mg Subcutaneous Q6 months PSusy Frizzle MD   60 mg at 10/05/19 07672   Allergies Allergies  Allergen Reactions  . Tape Other (See Comments)    Skin is somewhat sensitive    Histories Past Medical History:  Diagnosis Date  . Anemia   . Atrial fibrillation ([facility]   . Breast cancer ([facility] 10/2013   right upper outer  . Cancer ([facility]    right breast  . Complication of anesthesia    slow to wake up  . Diabetes mellitus without complication ([facility]   . Dysrhythmia 10/15   AF  . Former smoker   . Full dentures   . Hyperlipidemia   . Hypertension   . Multinodular goiter   . Osteoporosis   . Radiation    Right Breast  . Thyroid disease    hypothyroidism  . Wears glasses    Past Surgical History:  Procedure Laterality Date  . ABDOMINAL  HYSTERECTOMY    . AXILLARY LYMPH NODE DISSECTION Right 01/09/2014   Procedure: RIGHT AXILLARY LYMPH NODE DISECTION;  Surgeon: TErroll Luna MD;  [place] [facility]  Service: General;  Laterality: Right;  . BIOPSY  10/26/2019   Procedure: BIOPSY;  Surgeon: MRush LandmarkGTelford Nab, MD;  [facility];  Service: Gastroenterology;;  . BREAST LUMPECTOMY WITH RADIOACTIVE SEED LOCALIZATION Right 01/09/2014   Procedure: RIGHT BREAST SEED LOCALIZED LUMPECTOMY ;  Surgeon: TErroll Luna MD;  [place] [facility]  Service: General;  Laterality: Right;  . COLONOSCOPY WITH PROPOFOL N/A 10/26/2019   Procedure: COLONOSCOPY WITH PROPOFOL;  Surgeon: MRush LandmarkGTelford Nab, MD;  [facility];  Service: Gastroenterology;  Laterality: N/A;  . ESOPHAGOGASTRODUODENOSCOPY (EGD) WITH PROPOFOL N/A 10/26/2019   Procedure: ESOPHAGOGASTRODUODENOSCOPY (EGD) WITH PROPOFOL;  Surgeon: MRush LandmarkGTelford Nab, MD;  [facility];  Service: Gastroenterology;  Laterality: N/A;  . EYE SURGERY  12/22/2009  cataracts  . HEMOSTASIS CLIP PLACEMENT  10/26/2019   Procedure: HEMOSTASIS CLIP PLACEMENT;  Surgeon: Irving Copas., MD;  [facility];  Service: Gastroenterology;;  marking for transverse lesion  . INTRAMEDULLARY (IM) NAIL INTERTROCHANTERIC Left 02/24/2014   Procedure: IM ROD LEFT HIP FX;  Surgeon: Alta Corning, MD;  Location: [facility];  Service: Orthopedics;  Laterality: Left;  . left distal radius fracture     2020  . POLYPECTOMY  10/26/2019   Procedure: POLYPECTOMY;  Surgeon: Mansouraty, Telford Nab., MD;  [facility];  Service: Gastroenterology;;  . PORT-A-CATH REMOVAL Right 01/09/2014   Procedure: REMOVAL PORT-A-CATH;  Surgeon: [name], MD;  Location: [facility];  Service: General;  Laterality: Right;  . PORTACATH PLACEMENT N/A 11/15/2013   Procedure: INSERTION PORT-A-CATH WITH ULTRA SOUND AND FLOROSCOPY;   Surgeon: [name], MD;  Location: [facility];  Service: General;  Laterality: N/A;  . SUBMUCOSAL TATTOO INJECTION  10/26/2019   Procedure: SUBMUCOSAL TATTOO INJECTION;  Surgeon: Irving Copas., MD;  [facility];  Service: Gastroenterology;;  . TOTAL KNEE ARTHROPLASTY  2002   left   Social History   Socioeconomic History  . Marital status: Widowed    Spouse name: Not on file  . Number of children: 4  . Years of education: Not on file  . Highest education level: Not on file  Occupational History  . Not on file  Tobacco Use  . Smoking status: Former Smoker    Packs/day: 1.00    Years: 5.00    Pack years: 5.00    Quit date: 11/14/1958    Years since quitting: 61.0  . Smokeless tobacco: Never Used  Vaping Use  . Vaping Use: Never used  Substance and Sexual Activity  . Alcohol use: No    Alcohol/week: 0.0 standard drinks  . Drug use: No  . Sexual activity: Never    Comment: menarche age 89, P15, first birth age 23, no HRT, menopause age 43  Other Topics Concern  . Not on file  Social History Narrative  . Not on file   Social Determinants of Health   Financial Resource Strain:   . Difficulty of Paying Living Expenses: Not on file  Food Insecurity:   . Worried About Charity fundraiser in the Last Year: Not on file  . Ran Out of Food in the Last Year: Not on file  Transportation Needs:   . Lack of Transportation (Medical): Not on file  . Lack of Transportation (Non-Medical): Not on file  Physical Activity:   . Days of Exercise per Week: Not on file  . Minutes of Exercise per Session: Not on file  Stress:   . Feeling of Stress : Not on file  Social Connections:   . Frequency of Communication with Friends and Family: Not on file  . Frequency of Social Gatherings with Friends and Family: Not on file  . Attends Religious Services: Not on file  . Active Member of Clubs or Organizations: Not on file  . Attends Archivist Meetings: Not on file  .  Marital Status: Not on file  Intimate Partner Violence:   . Fear of Current or Ex-Partner: Not on file  . Emotionally Abused: Not on file  . Physically Abused: Not on file  . Sexually Abused: Not on file   Family History  Problem Relation Age of Onset  . Colon cancer Neg Hx   . Esophageal cancer Neg Hx   . Inflammatory  bowel disease Neg Hx   . Liver disease Neg Hx   . Pancreatic cancer Neg Hx   . Rectal cancer Neg Hx   . Stomach cancer Neg Hx    I have reviewed her medical, social, and family history in detail and updated the electronic medical record as necessary.    PHYSICAL EXAMINATION  BP 126/80   Pulse 68   Ht 5' 6"  (1.676 m)   Wt 135 lb 12.8 oz (61.6 kg)   BMI 21.92 kg/m  Wt Readings from Last 3 Encounters:  11/28/19 135 lb 12.8 oz (61.6 kg)  11/17/19 163 lb (73.9 kg)  11/09/19 158 lb (71.7 kg)  GEN: NAD, appears stated age, doesn't appear chronically ill, accompanied by granddaughter PSYCH: Cooperative, without pressured speech EYE: Conjunctivae pink, sclerae anicteric ENT: MMM CV: Nontachycardic RESP: No audible wheezing GI: NABS, soft, NT/ND, without rebound or guarding, no HSM appreciated MSK/EXT: Bilateral lower extremity edema present SKIN: No jaundices NEURO:  Alert & Oriented x 3, no focal deficits   REVIEW OF DATA  I reviewed the following data at the time of this encounter:  GI Procedures and Studies  August 2021 colonoscopy - Hemorrhoids found on digital rectal exam. - Stool in the entire examined colon. Lavaged with adequate visualization. - Two 4 to 10 mm polyps in the ascending colon and in the cecum, removed with a cold snare. Resected and retrieved. - Rule out malignancy, polypoid lesion in the transverse colon. Biopsied. Tattooed for marking purposes. Clip (MR conditional) was placed for marking purposes. - Patchy moderate inflammation was found in the proximal sigmoid colon and in the distal descending colon secondary to colitis - proximal  to the narrowing and sever diverticulosis noted below. Biopsied. - Severe diverticulosis in the recto-sigmoid colon, in the sigmoid colon and in the descending colon. There was narrowing of the colon in association with the diverticular opening. There was evidence of diverticular spasm. Peri-diverticular erythema was seen. There was no evidence of diverticular bleeding. - Stenosis in the recto-sigmoid colon in diverticular region that was congested with some concern for prolapsing of proximal tissue downwards that could lead to intussusception. Biopsied. - Non-bleeding non-thrombosed external and internal hemorrhoids.  August 2021 EGD - Bleb found in the esophagus. - No gross lesions in esophagus. - Z-line regular, 38 cm from the incisors. - 4 cm hiatal hernia. - No gross lesions in the stomach. - No gross lesions in the duodenal bulb, in the first portion of the duodenum and in the second portion of the duodenum. - No source for GI bleeding found on today's EGD.  Pathology FINAL MICROSCOPIC DIAGNOSIS:  A. COLON, CECUM AND ASCENDING, POLYPECTOMY:  - Tubular adenoma (s).  - No high-grade dysplasia or carcinoma.  B. COLON, TRANSVERSE, LESION, BIOPSY:  - At least high-grade glandular dysplasia involving fragments of tubular  adenoma.  - See comment.  C. COLON, SIGMOID, BIOPSY:  - Active colitis with focal erosion.  - Negative for dysplasia.  - See comment.  D. STENOSIS, BIOPSY:  - Colonic mucosa with mild nonspecific inflammatory changes.  - Negative for dysplasia.  COMMENT:  B. The transverse colon lesion has changes focally concerning for  invasive adenocarcinoma.   Laboratory Studies  Reviewed those in epic  Imaging Studies  No relevant studies to review   ASSESSMENT  Whitney [name] is a 86 y.o. female with a pmh significant for atrial fibrillation (on anticoagulation), prior breast cancer, hypertension, hyperlipidemia, hypothyroidism, anemia, severe diverticulosis, colon  polyps, transverse colon polyp  with high-grade dysplasia concerning for potential focus of adenocarcinoma.  The patient is seen today for evaluation and management of:  1. History of colon polyps   2. Colitis, acute   3. Diverticulosis   4. Stenosis colon ([place])   5. Bright red blood per rectum   6. Acute blood loss anemia   7. Abnormal colonoscopy    The patient is hemodynamically stable.  Clinically however there is a lesion in the transverse colon that is concerning for potential high-grade dysplasia with a focus of cancer.  I was overly concerned at the time of my procedure however her biopsies took longer than expected and she was discharged from the hospital prior to biopsies being completed otherwise able to try to remove the lesion in the inpatient setting.  With that being said the patient's hemoglobin has stabilized and is improving.  She still has evidence of anemia we will recheck her labs today.  Had a long discussion with the patient and patient's granddaughter.  As we do not have true documentation of invasive adenocarcinoma, I do feel that it is reasonable to pursue an Advanced Polypectomy attempt of the polyp/lesion.  We discussed some of the techniques of advanced polypectomy which include Endoscopic Mucosal Resection, OVESCO Full-Thickness Resection, Endorotor Morcellation, and Tissue Ablation via Fulguration.  The risks and benefits of endoscopic evaluation were discussed with the patient; these include but are not limited to the risk of perforation, infection, bleeding, missed lesions, lack of diagnosis, severe illness requiring hospitalization, as well as anesthesia and sedation related illnesses.  During attempts at advanced resection, the risks of bleeding and perforation/leak are increased as opposed to diagnostic and screening procedures, and that was discussed with the patient as well.  I suspect the piecemeal resection will be required and thus subsequent short-interval  endoscopic evaluation for follow up and potential retreatment of the lesion/area may be necessary.  If there is concern about the resection or there is evidence of cancer on final pathology, and they understand that surgical evaluation will be required.  All patient questions were answered, to the best of my ability, and the patient agrees to the aforementioned plan of action with follow-up as indicated.  After the completion of today's visit I further thought about the patient needs and a cross-sectional CT scan will help to ensure that we are not seeing any evidence of significant changes that would make me more concerned about our ability to try and resect this endoscopically.  We will work on arranging that as well.   PLAN  Laboratories as outlined below Proceed with scheduling colonoscopy with EMR attempt with ultrasound colonoscope After further review a CT abdomen/pelvis with IV and oral contrast will be performed to further discern and ensure that there are not findings cross-sectionally that would prevent [place] from considering an endoscopic approach to resection Anticoagulation hold for 2 days prior to procedure will need to be performed Continue oral iron for now   Orders Placed This Encounter  Procedures  . Procedural/ Surgical Case Request: COLONOSCOPY WITH PROPOFOL- Ultraslim scope, ENDOSCOPIC MUCOSAL RESECTION  . CBC  . Ambulatory referral to Gastroenterology    New Prescriptions   NA SULFATE-K SULFATE-MG SULF (SUPREP BOWEL PREP KIT) 17.5-3.13-1.6 GM/177ML SOLN    Take 1 kit by mouth as directed. For colonoscopy prep   Modified Medications   No medications on file    Planned Follow Up No follow-ups on file.   Total Time in Face-to-Face and in Coordination of Care for patient  including independent/personal interpretation/review of prior testing, medical history, examination, medication adjustment, communicating results with the patient directly, and documentation with the EHR  is 25 minutes.   Justice Britain, MD [facility] Advanced [facility]oscopy Office # [phone]

## 2019-12-01 NOTE — Telephone Encounter (Signed)
Recd clearance letter back from Dr. Dennard Schaumann - okay to hold eliquis x2 days prior to procedure. Letter sent to be scanned in. Pt's daug has been informed.

## 2019-12-06 ENCOUNTER — Telehealth: Payer: Self-pay | Admitting: Family Medicine

## 2019-12-06 NOTE — Telephone Encounter (Signed)
CB# [phone] [facility] waiting for [place] fax over the order for a wheelchair

## 2019-12-14 ENCOUNTER — Other Ambulatory Visit: Payer: Self-pay

## 2019-12-14 DIAGNOSIS — G8929 Other chronic pain: Secondary | ICD-10-CM

## 2019-12-14 DIAGNOSIS — R002 Palpitations: Secondary | ICD-10-CM

## 2019-12-14 DIAGNOSIS — M810 Age-related osteoporosis without current pathological fracture: Secondary | ICD-10-CM

## 2019-12-14 DIAGNOSIS — M7989 Other specified soft tissue disorders: Secondary | ICD-10-CM

## 2019-12-14 DIAGNOSIS — M545 Low back pain, unspecified: Secondary | ICD-10-CM

## 2019-12-14 DIAGNOSIS — M25552 Pain in left hip: Secondary | ICD-10-CM

## 2019-12-18 NOTE — Telephone Encounter (Signed)
I wrote rx and put it on my desk.

## 2019-12-18 NOTE — Telephone Encounter (Signed)
MD please advise

## 2019-12-18 NOTE — Telephone Encounter (Signed)
Order faxed.

## 2019-12-26 DIAGNOSIS — D649 Anemia, unspecified: Secondary | ICD-10-CM | POA: Diagnosis not present

## 2019-12-26 DIAGNOSIS — I509 Heart failure, unspecified: Secondary | ICD-10-CM | POA: Diagnosis not present

## 2019-12-26 DIAGNOSIS — M810 Age-related osteoporosis without current pathological fracture: Secondary | ICD-10-CM | POA: Diagnosis not present

## 2019-12-28 NOTE — Chronic Care Management (AMB) (Addendum)
Chronic Care Management Pharmacy  Name: Whitney [name]  MRN: [id number] DOB: 04-05-1927  Chief Complaint/ HPI  Whitney [name],  86 y.o. , female presents for their Initial CCM visit with the clinical pharmacist In office.  PCP : Whitney Frizzle, MD  Their chronic conditions include: HTN, CHF, Afib, Type II DM, Hypothyroidism,CKD, Hypercholesterolemia.  Office Visits: 11/17/2019 Dennard Schaumann ) -  For the past week she had recurrent episodes of rectal bleeding, she was brought to ED for eval. Hgb was down to 7.0 and fecal occult blood positive.  09/01/2019 ([place]) -  Follow up for low Hgb.  She does reports black stools, ordered CBC, possible anemia from chronic disease, patient may benefit from erythropoietin.  Consult Visit:  Medications: Outpatient Encounter Medications as of 01/01/2020  Medication Sig   amiodarone (PACERONE) 200 MG tablet Take 1 tablet (200 mg total) by mouth daily.   apixaban (ELIQUIS) 5 MG TABS tablet Take 1 tablet (5 mg total) by mouth 2 (two) times daily. HOLD for 1 week   atorvastatin (LIPITOR) 20 MG tablet Take 0.5 tablets (10 mg total) by mouth daily.   Blood Glucose Monitoring Suppl (ONETOUCH VERIO) w/Device KIT Use to test blood sugars daily.   doxazosin (CARDURA) 4 MG tablet Take 0.5 tablets (2 mg total) by mouth daily.   ferrous sulfate 324 (65 Fe) MG TBEC TAKE 1 TABLET BY MOUTH IN THE MORNING AND 1 AT BEDTIME   furosemide (LASIX) 40 MG tablet Take 1 tablet (40 mg total) by mouth daily. (Patient taking differently: Take 80 mg by mouth daily. )   meclizine (ANTIVERT) 12.5 MG tablet TAKE 1 TABLET BY MOUTH DAILY AS NEEDED FOR DIZZINESS (Patient taking differently: Take 12.5 mg by mouth daily as needed for dizziness. TAKE 1 TABLET BY MOUTH DAILY AS NEEDED FOR DIZZINESS)   metoprolol succinate (TOPROL-XL) 100 MG 24 hr tablet TAKE 1 TABLET BY MOUTH TWICE A DAY WITH OR IMMEDIATELY FOLLOWING A MEAL   Na Sulfate-K Sulfate-Mg Sulf (SUPREP BOWEL PREP KIT)  17.5-3.13-1.6 GM/177ML SOLN Take 1 kit by mouth as directed. For colonoscopy prep   ONETOUCH VERIO test strip CHECK BLOOD SUGAR EVERY DAY AS DIRECTED   polyethylene glycol (MIRALAX / GLYCOLAX) 17 g packet Take 17 g by mouth daily. Also available OTC   sitaGLIPtin (JANUVIA) 100 MG tablet Take 0.5 tablets (50 mg total) by mouth daily. Take 1/2 tablet (31m total) by mouth once daily.   tamoxifen (NOLVADEX) 20 MG tablet Take 1 tablet (20 mg total) by mouth daily.   Vitamin D3 (VITAMIN D) 25 MCG tablet TAKE 1 TABLET BY MOUTH EVERY DAY   Facility-Administered Encounter Medications as of 01/01/2020  Medication   denosumab (PROLIA) injection 60 mg     Current Diagnosis/Assessment:   FEmergency planning/management officerStrain: Low Risk    Difficulty of Paying Living Expenses: Not very hard    Goals Addressed             This Visit's Progress    Pharmacy Care Plan:       CARE PLAN ENTRY (see longitudinal plan of care for additional care plan information)  Current Barriers:  Chronic Disease Management support, education, and care coordination needs related to Diabetes, Atrial Fibrillation, and Heart Failure    Diabetes Lab Results  Component Value Date/Time   HGBA1C 6.1 (A) 07/19/2019 09:15 AM   HGBA1C 5.7 (A) 03/07/2019 01:27 PM   HGBA1C 6.4 (H) 04/18/2018 08:48 AM   HGBA1C 6.1 (H) 10/25/2016 04:43 AM  Pharmacist Clinical Goal(s): Over the next 180 days, patient will work with PharmD and providers to maintain A1c goal <7% Current regimen:  Januvia 122m one-half tablet daily Interventions: Reviewed most recent A1c and kidney function Counseled on moderation of sweets such as cookie Reviewed home blood sugar readings Patient self care activities - Over the next 180 days, patient will: Check blood sugar once daily, document, and provide at future appointments Contact provider with any episodes of hypoglycemia  HF Pharmacist Clinical Goal(s) Over the next 180 days, patient will work with  PharmD and providers to optimize medication and minimize symptoms of HF. Current regimen:  Metoprolol XL 1039mtwice daily Furosemide 4061maily Interventions: Counseled on importance of daily weight monitoring Evaluated current fluid status Recommended feet elevation as much as possible. Patient self care activities - Over the next 180 days, patient will: Continue to take medication as directed Contact providers with > 3lb weight loss in one day or 5 lbs. In one week.  Afib Pharmacist Clinical Goal(s) Over the next 180 days, patient will work with PharmD and providers to optimize medication or minimize symptoms of Afib. Current regimen:  Amiodarone 200m72mily Eliquis 5mg 30mce daily Interventions: Recommend dose decrease of Eliquis based on Age and Scr. Evaluated for abnormal bruising or bleeding. Patient self care activities - Over the next 180 days, patient will: Continue to take medication as current until directed otherwise by pharmacist. Contact providers with any abnormal bleeding or bruising.   Initial goal documentation         AFIB  Patient is currently rate controlled.  Patient has failed these meds in past: none noted Patient is currently controlled on the following medications: Amiodarone 200mg 51my Eliquis 5mg tw25m daily  We discussed:  She has a history of bleed with low Hgb.  She reports that she does see some blood in her depends from time to time but this has improved tremendously.  Last Hgb was 9.8. She is a candidate for decreased dose of Eliquis 2.5mg twi36mdaily due to her advanced age > 82 and h25 increased Scr > 1.5.  Will consult with PCP for dose change. She has a upcoming colonoscopy in November, reminded her she will need to hold Eliquis following the directions of the hospital, this may be a good opportunity to start the new dose of Eliquis.  Plan  Continue current medications, recommend dose decrease of Eliquis to 2.5mg twic66maily  based on age, Scr, and history of bleed.   Heart Failure   Type: Diastolic   Patient has failed these meds in past: none noted Patient is currently controlled on the following medications:  Metoprolol XL 100mg bid 27msemide 40mg  We d39mssed  weighing daily; if you gain more than 3 pounds in one day or 5 pounds in one week call your doctor  She reports some leg swelling even with two doses of furosemide some days, this resolves with feet elevation.   She is not currently monitoring her weight, as mentioned above we discussed the importance of this and to notify us if the a[place] parameters are seen.  Plan  Continue current medications  Hypercholesterolemia   LDL goal < 70  Lipid Panel     Component Value Date/Time   CHOL 110 07/19/2019 0959   TRIG 82.0 07/19/2019 0959   HDL 40.10 07/19/2019 0959   LDLCALC 53 07/19/2019 0959   LDLCALC 59 04/18/2018 0848    Hepatic Function Latest Ref Rng & Units 11/09/2019 10/24/2019 07/19/2019  Total Protein 6.1 - 8.1 g/dL 5.2(L) 5.2(L) 5.9(L)  Albumin 3.5 - 5.0 g/dL - 2.7(L) 3.3(L)  AST 10 - 35 U/L 51(H) 34 33  ALT 6 - 29 U/L 44(H) 25 28  Alk Phosphatase 38 - 126 U/L - 25(L) 29(L)  Total Bilirubin 0.2 - 1.2 mg/dL 0.7 0.5 0.5  Bilirubin, Direct 0.0 - 0.3 mg/dL - - -     The ASCVD Risk score Mikey Bussing DC Jr., et al., 2013) failed to calculate for the following reasons:   The 2013 ASCVD risk score is only valid for ages 2 to 1   Patient has failed these meds in past: none noted Patient is currently controlled on the following medications:  Atorvastatin 49m  We discussed: LDL well controlled She takes daily, denies myalgias She admits to liking sweets, counseled on minimizing this.  Plan  Continue current medications  Hypertension   BP goal is:  <130/80  Office blood pressures are  BP Readings from Last 3 Encounters:  11/28/19 126/80  11/17/19 118/72  11/09/19 120/70   Patient checks BP at home infrequently Patient home  BP readings are ranging: no logs at home  Patient has failed these meds in the past: none noted Patient is currently controlled on the following medications:  Doxazosin 441mMetoprolol XL 10046mid  We discussed Does not monitor BP, office BP always controlled Counseled on monitoring periodically, especially with afib and HF  Plan  Continue current medications     Diabetes w/ CKD   A1c goal <7%  Recent Relevant Labs: Lab Results  Component Value Date/Time   HGBA1C 6.1 (A) 07/19/2019 09:15 AM   HGBA1C 5.7 (A) 03/07/2019 01:27 PM   HGBA1C 6.4 (H) 04/18/2018 08:48 AM   HGBA1C 6.1 (H) 10/25/2016 04:43 AM   GFR 39.53 (L) 07/19/2019 12:34 PM   GFR 47.12 (L) 10/28/2018 10:49 AM   MICROALBUR 6.1 04/18/2018 08:48 AM   MICROALBUR 5.3 (H) 06/29/2017 09:16 AM    Last diabetic Eye exam:  Lab Results  Component Value Date/Time   HMDIABEYEEXA No Retinopathy 12/04/2015 12:00 AM    Last diabetic Foot exam: No results found for: HMDIABFOOTEX   Checking BG: Daily  Unclear if she is fasting or not when she checks.  Most readings are in the evening,  The two recalled today were 202 and 150.  They report that most blood sugars are < 200.  Patient has failed these meds in past: none noted Patient is currently controlled on the following medications: Januvia 100m37me-half tablet po daily   We discussed:  Admits to sweets such as cookies.  Counseled on moderation and cabrohydrates.  A1c is WNL at last check.  She denies any hypoglycemia symptoms or low readings.     Plan  Continue current medications Hypothyroidism   Lab Results  Component Value Date/Time   TSH 0.74 07/19/2019 12:34 PM   TSH 0.80 10/28/2018 10:49 AM   FREET4 1.58 10/28/2018 10:49 AM   FREET4 1.60 06/27/2018 10:49 AM    Patient has failed these meds in past: none noted Patient is currently controlled on the following medications:  No meds  She denies symptoms and TSH normal.  Plan  Continue current  medications  Vaccines   Reviewed and discussed patient's vaccination history.    Immunization History  Administered Date(s) Administered   Fluad Quad(high Dose 65+) 11/18/2018, 11/17/2019   Influenza Split 01/14/2013   Influenza, High Dose Seasonal PF 12/07/2016, 12/14/2017   Influenza,inj,Quad PF,6+ Mos 01/01/2014, 12/21/2014,  12/19/2015   Pneumococcal Conjugate-13 07/23/2014   Pneumococcal Polysaccharide-23 06/25/2008, 02/26/2014   Zoster Recombinat (Shingrix) 04/18/2018    Plan  Vaccines are up to date no recommendations at this time. Medication Management   Miscellaneous medications: none OTC's:  Meclizine 12.36m prn Ferrous sulfate 3229mVitamin D [facility].  Patient reports using pill box method organized by her daughter to organize medications and promote adherence. Patient denies missed doses of medication.  ChBeverly MilchPharmD Clinical Pharmacist [phone] have collaborated with the care management provider regarding care management and care coordination activities outlined in this encounter and have reviewed this encounter including documentation in the note and care plan. I am certifying that I agree with the content of this note and encounter as supervising physician.

## 2019-12-29 ENCOUNTER — Telehealth: Payer: Self-pay | Admitting: Pharmacist

## 2019-12-29 NOTE — Progress Notes (Signed)
Chronic Care Management Pharmacy Assistant   Name: Whitney [name]  MRN: [id number] DOB: 08/13/1927  Reason for Encounter: Initial Questions   PCP : [name], MD  Allergies:   Allergies  Allergen Reactions  . Tape Other (See Comments)    Skin is somewhat sensitive    Medications: Outpatient Encounter Medications as of 12/29/2019  Medication Sig  . amiodarone (PACERONE) 200 MG tablet Take 1 tablet (200 mg total) by mouth daily.  Marland Kitchen apixaban (ELIQUIS) 5 MG TABS tablet Take 1 tablet (5 mg total) by mouth 2 (two) times daily. HOLD for 1 week  . atorvastatin (LIPITOR) 20 MG tablet Take 0.5 tablets (10 mg total) by mouth daily.  . Blood Glucose Monitoring Suppl (ONETOUCH VERIO) w/Device KIT Use to test blood sugars daily.  Marland Kitchen doxazosin (CARDURA) 4 MG tablet Take 0.5 tablets (2 mg total) by mouth daily.  . ferrous sulfate 324 (65 Fe) MG TBEC TAKE 1 TABLET BY MOUTH IN THE MORNING AND 1 AT BEDTIME  . furosemide (LASIX) 40 MG tablet Take 1 tablet (40 mg total) by mouth daily. (Patient taking differently: Take 80 mg by mouth daily. )  . meclizine (ANTIVERT) 12.5 MG tablet TAKE 1 TABLET BY MOUTH DAILY AS NEEDED FOR DIZZINESS (Patient taking differently: Take 12.5 mg by mouth daily as needed for dizziness. TAKE 1 TABLET BY MOUTH DAILY AS NEEDED FOR DIZZINESS)  . metoprolol succinate (TOPROL-XL) 100 MG 24 hr tablet TAKE 1 TABLET BY MOUTH TWICE A DAY WITH OR IMMEDIATELY FOLLOWING A MEAL  . Na Sulfate-K Sulfate-Mg Sulf (SUPREP BOWEL PREP KIT) 17.5-3.13-1.6 GM/177ML SOLN Take 1 kit by mouth as directed. For colonoscopy prep  . ONETOUCH VERIO test strip CHECK BLOOD SUGAR EVERY DAY AS DIRECTED  . polyethylene glycol (MIRALAX / GLYCOLAX) 17 g packet Take 17 g by mouth daily. Also available OTC  . sitaGLIPtin (JANUVIA) 100 MG tablet Take 0.5 tablets (50 mg total) by mouth daily. Take 1/2 tablet (68m total) by mouth once daily.  . tamoxifen (NOLVADEX) 20 MG tablet Take 1 tablet (20 mg total)  by mouth daily.  . Vitamin D3 (VITAMIN D) 25 MCG tablet TAKE 1 TABLET BY MOUTH EVERY DAY   Facility-Administered Encounter Medications as of 12/29/2019  Medication  . denosumab (PROLIA) injection 60 mg    Current Diagnosis: Patient Active Problem List   Diagnosis Date Noted  . History of colon polyps 12/01/2019  . Colitis, acute 12/01/2019  . Diverticulosis 12/01/2019  . Stenosis colon ([facility] 12/01/2019  . Abnormal colonoscopy 12/01/2019  . Anemia 10/25/2019  . Bright red blood per rectum 10/24/2019  . Chronic kidney disease, stage 3a ([place] 10/24/2019  . Coagulopathy ([place]   . Osteoporosis   . Unilateral primary osteoarthritis, right knee 03/25/2018  . Chronic pain of right knee 05/25/2017  . Bacteremia 10/25/2016  . Transaminitis 10/23/2016  . Sinus bradycardia 10/23/2016  . Controlled diabetes mellitus type 2 with complications (HCrescent [place]   . Paroxysmal atrial fibrillation (HCC)   . Chronic diastolic CHF (congestive heart failure) ([place]   . Pulmonary hypertension ([place]   . Acute renal failure superimposed on stage 2 chronic kidney disease (HDes L[place]   . Elevated liver enzymes   . Bacteremia due to Escherichia coli 12/30/2015  . UTI (urinary tract infection) 12/30/2015  . Acute blood loss anemia 02/25/2014  . Closed left hip fracture (HFre[place] 02/24/2014  . PAF (paroxysmal atrial fibrillation) (HL[place] 01/30/2014  . Anticoagulated 01/30/2014  . Chronic diastolic CHF (congestive heart failure), NYHA  class 2 ([place]) 12/10/2013  . Preoperative cardiovascular examination 12/10/2013  . Neutropenic fever ([place]) 12/08/2013  . Anemia of chronic disease 12/08/2013  . Thrombocytopenia ([place]) 12/08/2013  . Hypokalemia 12/08/2013  . Pancytopenia due to antineoplastic chemotherapy ([place]) 12/08/2013  . Hypothyroidism 12/08/2013  . Breast cancer of upper-outer quadrant of right female breast ([place]) 11/01/2013  . Essential hypertension, benign 12/01/2012  . Pure hypercholesterolemia 12/01/2012  . DM2  (diabetes mellitus, type 2) ([place]) 11/21/2012    Goals Addressed   None     Have you seen any other providers since your last visit? No   Any changes in your medications or health? No   Any side effects from any medications? No   Do you have an symptoms or problems not managed by your medications? No   Any concerns about your health right now? Not at this time   Has your provider asked that you check blood pressure, blood sugar, or follow special diet at home? Patient states she checks her blood pressure and blood sugar daily.   Do you get any type of exercise on a regular basis? Yes   Can you think of a goal you would like to reach for your health? No   Do you have any problems getting your medications? No. [facility] Has no complaints   Is there anything that you would like to discuss during the appointment? No  Reminded the patient to please bring medications and supplements to appointment on Monday October 19th at [facility] in the office.   Follow-Up:  Pharmacist Review   Fanny Skates, [place] Pharmacist Assistant [phone]

## 2020-01-01 ENCOUNTER — Other Ambulatory Visit: Payer: Self-pay

## 2020-01-01 ENCOUNTER — Ambulatory Visit: Payer: Medicare Other | Admitting: Pharmacist

## 2020-01-01 DIAGNOSIS — I480 Paroxysmal atrial fibrillation: Secondary | ICD-10-CM

## 2020-01-01 DIAGNOSIS — E118 Type 2 diabetes mellitus with unspecified complications: Secondary | ICD-10-CM

## 2020-01-01 DIAGNOSIS — I5032 Chronic diastolic (congestive) heart failure: Secondary | ICD-10-CM

## 2020-01-01 NOTE — Patient Instructions (Addendum)
Visit Information Thank you for meeting with me today!  I look forward to working with you to help you meet all of your healthcare goals and answer any questions you may have.  Feel free to contact me anytime!  Goals Addressed            This Visit's Progress   . Pharmacy Care Plan:       CARE PLAN ENTRY (see longitudinal plan of care for additional care plan information)  Current Barriers:  . Chronic Disease Management support, education, and care coordination needs related to Diabetes, Atrial Fibrillation, and Heart Failure    Diabetes Lab Results  Component Value Date/Time   HGBA1C 6.1 (A) 07/19/2019 09:15 AM   HGBA1C 5.7 (A) 03/07/2019 01:27 PM   HGBA1C 6.4 (H) 04/18/2018 08:48 AM   HGBA1C 6.1 (H) 10/25/2016 04:43 AM   . Pharmacist Clinical Goal(s): o Over the next 180 days, patient will work with PharmD and providers to maintain A1c goal <7% . Current regimen:  o Januvia 100mg  one-half tablet daily . Interventions: o Reviewed most recent A1c and kidney function o Counseled on moderation of sweets such as cookie o Reviewed home blood sugar readings . Patient self care activities - Over the next 180 days, patient will: o Check blood sugar once daily, document, and provide at future appointments o Contact provider with any episodes of hypoglycemia  HF . Pharmacist Clinical Goal(s) o Over the next 180 days, patient will work with PharmD and providers to optimize medication and minimize symptoms of HF. . Current regimen:  o Metoprolol XL 100mg  twice daily o Furosemide 40mg  daily . Interventions: o Counseled on importance of daily weight monitoring o Evaluated current fluid status o Recommended feet elevation as much as possible. . Patient self care activities - Over the next 180 days, patient will: o Continue to take medication as directed o Contact providers with > 3lb weight loss in one day or 5 lbs. In one week.  Afib . Pharmacist Clinical Goal(s) o Over the  next 180 days, patient will work with PharmD and providers to optimize medication or minimize symptoms of Afib. . Current regimen:   Amiodarone 200mg  daily  Eliquis 5mg  twice daily . Interventions: o Recommend dose decrease of Eliquis based on Age and Scr. o Evaluated for abnormal bruising or bleeding. . Patient self care activities - Over the next 180 days, patient will: o Continue to take medication as current until directed otherwise by pharmacist. o Contact providers with any abnormal bleeding or bruising.   Initial goal documentation        Whitney [name] was given information about Chronic Care Management services today including:  1. CCM service includes personalized support from designated clinical staff supervised by her physician, including individualized plan of care and coordination with other care providers 2. 24/7 contact phone numbers for assistance for urgent and routine care needs. 3. Standard insurance, coinsurance, copays and deductibles apply for chronic care management only during months in which we provide at least 20 minutes of these services. Most insurances cover these services at 100%, however patients may be responsible for any copay, coinsurance and/or deductible if applicable. This service may help you avoid the need for more expensive face-to-face services. 4. Only one practitioner may furnish and bill the service in a calendar month. 5. The patient may stop CCM services at any time (effective at the end of the month) by phone call to the office staff.  Patient agreed to services and  verbal consent obtained.   The patient verbalized understanding of instructions provided today and agreed to receive a mailed copy of patient instruction and/or educational materials. Telephone follow up appointment with pharmacy team member scheduled for: 7 months Nickalus Thornsberry, PharmD Clinical Pharmacist [facility] [phone]  Atrial  Fibrillation  Atrial fibrillation is a type of irregular or rapid heartbeat (arrhythmia). In atrial fibrillation, the top part of the heart (atria) beats in an irregular pattern. This makes the heart unable to pump blood normally and effectively. The goal of treatment is to prevent blood clots from forming, control your heart rate, or restore your heartbeat to a normal rhythm. If this condition is not treated, it can cause serious problems, such as a weakened heart muscle (cardiomyopathy) or a stroke. What are the causes? This condition is often caused by medical conditions that damage the heart's electrical system. These include:  High blood pressure (hypertension). This is the most common cause.  Certain heart problems or conditions, such as heart failure, coronary artery disease, heart valve problems, or heart surgery.  Diabetes.  Overactive thyroid (hyperthyroidism).  Obesity.  Chronic kidney disease. In some cases, the cause of this condition is not known. What increases the risk? This condition is more likely to develop in:  Older people.  People who smoke.  Athletes who do endurance exercise.  People who have a family history of atrial fibrillation.  Men.  People who use drugs.  People who drink a lot of alcohol.  People who have lung conditions, such as emphysema, pneumonia, or COPD.  People who have obstructive sleep apnea. What are the signs or symptoms? Symptoms of this condition include:  A feeling that your heart is racing or beating irregularly.  Discomfort or pain in your chest.  Shortness of breath.  Sudden light-headedness or weakness.  Tiring easily during exercise or activity.  Fatigue.  Syncope (fainting).  Sweating. In some cases, there are no symptoms. How is this diagnosed? Your health care provider may detect atrial fibrillation when taking your pulse. If detected, this condition may be diagnosed with:  An electrocardiogram (ECG) to  check electrical signals of the heart.  An ambulatory cardiac monitor to record your heart's activity for a few days.  A transthoracic echocardiogram (TTE) to create pictures of your heart.  A transesophageal echocardiogram (TEE) to create even closer pictures of your heart.  A stress test to check your blood supply while you exercise.  Imaging tests, such as a CT scan or chest X-ray.  Blood tests. How is this treated? Treatment depends on underlying conditions and how you feel when you experience atrial fibrillation. This condition may be treated with:  Medicines to prevent blood clots or to treat heart rate or heart rhythm problems.  Electrical cardioversion to reset the heart's rhythm.  A pacemaker to correct abnormal heart rhythm.  Ablation to remove the heart tissue that sends abnormal signals.  Left atrial appendage closure to seal the area where blood clots can form. In some cases, underlying conditions will be treated. Follow these instructions at home: Medicines  Take over-the counter and prescription medicines only as told by your health care provider.  Do not take any new medicines without talking to your health care provider.  If you are taking blood thinners: ? Talk with your health care provider before you take any medicines that contain aspirin or NSAIDs, such as ibuprofen. These medicines increase your risk for dangerous bleeding. ? Take your medicine exactly as  told, at the same time every day. ? Avoid activities that could cause injury or bruising, and follow instructions about how to prevent falls. ? Wear a medical alert bracelet or carry a card that lists what medicines you take. Lifestyle      Do not use any products that contain nicotine or tobacco, such as cigarettes, e-cigarettes, and chewing tobacco. If you need help quitting, ask your health care provider.  Eat heart-healthy foods. Talk with a dietitian to make an eating plan that is right for  you.  Exercise regularly as told by your health care provider.  Do not drink alcohol.  Lose weight if you are overweight.  Do not use drugs, including cannabis. General instructions  If you have obstructive sleep apnea, manage your condition as told by your health care provider.  Do not use diet pills unless your health care provider approves. Diet pills can make heart problems worse.  Keep all follow-up visits as told by your health care provider. This is important. Contact a health care provider if you:  Notice a change in the rate, rhythm, or strength of your heartbeat.  Are taking a blood thinner and you notice more bruising.  Tire more easily when you exercise or do heavy work.  Have a sudden change in weight. Get help right away if you have:   Chest pain, abdominal pain, sweating, or weakness.  Trouble breathing.  Side effects of blood thinners, such as blood in your vomit, stool, or urine, or bleeding that cannot stop.  Any symptoms of a stroke. "BE FAST" is an easy way to remember the main warning signs of a stroke: ? B - Balance. Signs are dizziness, sudden trouble walking, or loss of balance. ? E - Eyes. Signs are trouble seeing or a sudden change in vision. ? F - Face. Signs are sudden weakness or numbness of the face, or the face or eyelid drooping on one side. ? A - Arms. Signs are weakness or numbness in an arm. This happens suddenly and usually on one side of the body. ? S - Speech. Signs are sudden trouble speaking, slurred speech, or trouble understanding what people say. ? T - Time. Time to call emergency services. Write down what time symptoms started.  Other signs of a stroke, such as: ? A sudden, severe headache with no known cause. ? Nausea or vomiting. ? Seizure. These symptoms may represent a serious problem that is an emergency. Do not wait to see if the symptoms will go away. Get medical help right away. Call your local emergency services (911  in the [place] Do not drive yourself to the hospital. Summary  Atrial fibrillation is a type of irregular or rapid heartbeat (arrhythmia).  Symptoms include a feeling that your heart is beating fast or irregularly.  You may be given medicines to prevent blood clots or to treat heart rate or heart rhythm problems.  Get help right away if you have signs or symptoms of a stroke.  Get help right away if you cannot catch your breath or have chest pain or pressure. This information is not intended to replace advice given to you by your health care provider. Make sure you discuss any questions you have with your health care provider. Document Revised: 08/24/2018 Document Reviewed: 08/24/2018 Elsevier Patient Education  [place].

## 2020-01-03 ENCOUNTER — Telehealth: Payer: Self-pay | Admitting: Pharmacist

## 2020-01-03 ENCOUNTER — Other Ambulatory Visit: Payer: Self-pay

## 2020-01-03 DIAGNOSIS — I10 Essential (primary) hypertension: Secondary | ICD-10-CM

## 2020-01-03 DIAGNOSIS — I272 Pulmonary hypertension, unspecified: Secondary | ICD-10-CM

## 2020-01-03 DIAGNOSIS — K5790 Diverticulosis of intestine, part unspecified, without perforation or abscess without bleeding: Secondary | ICD-10-CM

## 2020-01-03 DIAGNOSIS — I5032 Chronic diastolic (congestive) heart failure: Secondary | ICD-10-CM

## 2020-01-03 MED ORDER — APIXABAN 2.5 MG PO TABS: 2.5000 mg | ORAL_TABLET | Freq: Two times a day (BID) | ORAL | Status: DC

## 2020-01-03 NOTE — Telephone Encounter (Signed)
-----   Message from [name], MD sent at 01/02/2020  6:29 AM EDT ----- I think that is a good idea especially with her drop in hgb.  Thanks.   ----- Message ----- From: Edythe Clarity, [facility] Sent: 01/01/2020  10:28 AM EDT To: [name], MD  Dr. Dennard Schaumann,  Reviewing Mrs. Wiedemann's chart and noticed she was taking Eliquis 5mg  bid currently.  Based on her Age (> 55) and Scr > 1.5 she would be a candidate for the 2.5 mg dose twice daily.  With her history of bleeds I was wondering your opinion on decreasing her dose of Eliquis to 2.5mg  twice daily?  Beverly Milch, PharmD Clinical Pharmacist [place] [phone]

## 2020-01-03 NOTE — Chronic Care Management (AMB) (Addendum)
Spoke with patients granddaughter to let her know of new dose of Eliquis 2.5mg  twice daily and to stop the 5mg  she is currently taking.  New Rx being called in to [facility].  Beverly Milch, PharmD Clinical Pharmacist [place] [phone] I have collaborated with the care management provider regarding care management and care coordination activities outlined in this encounter and have reviewed this encounter including documentation in the note and care plan. I am certifying that I agree with the content of this note and encounter as supervising physician.

## 2020-01-08 ENCOUNTER — Telehealth: Payer: Self-pay

## 2020-01-08 DIAGNOSIS — Z86010 Personal history of colonic polyps: Secondary | ICD-10-CM

## 2020-01-08 DIAGNOSIS — K529 Noninfective gastroenteritis and colitis, unspecified: Secondary | ICD-10-CM

## 2020-01-08 DIAGNOSIS — K5790 Diverticulosis of intestine, part unspecified, without perforation or abscess without bleeding: Secondary | ICD-10-CM

## 2020-01-08 NOTE — Telephone Encounter (Signed)
You have been scheduled for a CT scan of the abdomen and pelvis at [facility], 1st floor Radiology. You are scheduled on 01/12/20  at 4:00pm. You should arrive 15 minutes prior to your appointment time for registration.  Please pick up 2 bottles of contrast from [facility] at least 3 days prior to your scan. The solution may taste better if refrigerated, but do NOT add ice or any other liquid to this solution. Shake well before drinking.   Please follow the written instructions below on the day of your exam:   1) Do not eat anything after 12:00pm (4 hours prior to your test)   2) Drink 1 bottle of contrast @ 2:00pm (2 hours prior to your exam)  Remember to shake well before drinking and do NOT pour over ice.     Drink 1 bottle of contrast @ 3:00pm (1 hour prior to your exam)   You may take any medications as prescribed with a small amount of water, if necessary. If you take any of the following medications: METFORMIN, GLUCOPHAGE, GLUCOVANCE, AVANDAMET, RIOMET, FORTAMET, [place] MET, JANUMET, GLUMETZA or METAGLIP, you MAY be asked to HOLD this medication 48 hours AFTER the exam.   The purpose of you drinking the oral contrast is to aid in the visualization of your intestinal tract. The contrast solution may cause some diarrhea. Depending on your individual set of symptoms, you may also receive an intravenous injection of x-ray contrast/dye. Plan on being at [facility] for 45 minutes or longer, depending on the type of exam you are having performed.   If you have any questions regarding your exam or if you need to reschedule, you may call [facility] at [phone] between the hours of 8:00 am and 5:00 pm, Monday-Friday.

## 2020-01-12 ENCOUNTER — Other Ambulatory Visit: Payer: Self-pay

## 2020-01-12 ENCOUNTER — Ambulatory Visit (HOSPITAL_COMMUNITY)
Admission: RE | Admit: 2020-01-12 | Discharge: 2020-01-12 | Disposition: A | Discharge status: Home / Self Care | Payer: Medicare Other | Source: Ambulatory Visit | Attending: Gastroenterology | Admitting: Gastroenterology

## 2020-01-12 ENCOUNTER — Ambulatory Visit: Payer: Self-pay | Admitting: Pharmacist

## 2020-01-12 DIAGNOSIS — K5790 Diverticulosis of intestine, part unspecified, without perforation or abscess without bleeding: Secondary | ICD-10-CM | POA: Insufficient documentation

## 2020-01-12 DIAGNOSIS — K529 Noninfective gastroenteritis and colitis, unspecified: Secondary | ICD-10-CM | POA: Insufficient documentation

## 2020-01-12 DIAGNOSIS — Z86010 Personal history of colonic polyps: Secondary | ICD-10-CM | POA: Insufficient documentation

## 2020-01-12 DIAGNOSIS — S32592A Other specified fracture of left pubis, initial encounter for closed fracture: Secondary | ICD-10-CM | POA: Diagnosis not present

## 2020-01-12 DIAGNOSIS — S72002A Fracture of unspecified part of neck of left femur, initial encounter for closed fracture: Secondary | ICD-10-CM | POA: Diagnosis not present

## 2020-01-12 DIAGNOSIS — K5730 Diverticulosis of large intestine without perforation or abscess without bleeding: Secondary | ICD-10-CM | POA: Diagnosis not present

## 2020-01-12 LAB — POCT I-STAT CREATININE: Creatinine, Ser: 1.4 mg/dL — ABNORMAL HIGH (ref 0.44–1.00)

## 2020-01-12 MED ORDER — IOHEXOL 300 MG/ML  SOLN
80.0000 mL | Freq: Once | INTRAMUSCULAR | Status: AC | PRN
  Administered 2020-01-12: 80 mL via INTRAVENOUS

## 2020-01-12 NOTE — Chronic Care Management (AMB) (Addendum)
Chronic Care Management   Follow Up Note   01/12/2020 Name: Whitney [name] MRN: [id number] DOB: 01/05/1928  Referred by: [name], MD Reason for referral : No chief complaint on file.   INDIANNA BORAN is a 86 y.o. year old female who is a primary care patient of Pickard, Cammie Mcgee, MD. The CCM team was consulted for assistance with chronic disease management and care coordination needs.    Review of patient status, including review of consultants reports, relevant laboratory and other test results, and collaboration with appropriate care team members and the patient's provider was performed as part of comprehensive patient evaluation and provision of chronic care management services.    SDOH (Social Determinants of Health) assessments performed: No See Care Plan activities for detailed interventions related to [facility])      Outpatient Encounter Medications as of 01/12/2020  Medication Sig   amiodarone (PACERONE) 200 MG tablet Take 1 tablet (200 mg total) by mouth daily.   apixaban (ELIQUIS) 2.5 MG TABS tablet Take 1 tablet (2.5 mg total) by mouth 2 (two) times daily. Provider has put Patient on 2.5 mg twice daily.   atorvastatin (LIPITOR) 20 MG tablet Take 0.5 tablets (10 mg total) by mouth daily.   Blood Glucose Monitoring Suppl (ONETOUCH VERIO) w/Device KIT Use to test blood sugars daily.   doxazosin (CARDURA) 4 MG tablet Take 0.5 tablets (2 mg total) by mouth daily.   ferrous sulfate 324 (65 Fe) MG TBEC TAKE 1 TABLET BY MOUTH IN THE MORNING AND 1 AT BEDTIME   furosemide (LASIX) 40 MG tablet Take 1 tablet (40 mg total) by mouth daily. (Patient taking differently: Take 80 mg by mouth daily. )   meclizine (ANTIVERT) 12.5 MG tablet TAKE 1 TABLET BY MOUTH DAILY AS NEEDED FOR DIZZINESS (Patient taking differently: Take 12.5 mg by mouth daily as needed for dizziness. TAKE 1 TABLET BY MOUTH DAILY AS NEEDED FOR DIZZINESS)   metoprolol succinate (TOPROL-XL) 100 MG 24 hr tablet TAKE  1 TABLET BY MOUTH TWICE A DAY WITH OR IMMEDIATELY FOLLOWING A MEAL   Na Sulfate-K Sulfate-Mg Sulf (SUPREP BOWEL PREP KIT) 17.5-3.13-1.6 GM/177ML SOLN Take 1 kit by mouth as directed. For colonoscopy prep   ONETOUCH VERIO test strip CHECK BLOOD SUGAR EVERY DAY AS DIRECTED   polyethylene glycol (MIRALAX / GLYCOLAX) 17 g packet Take 17 g by mouth daily. Also available OTC   sitaGLIPtin (JANUVIA) 100 MG tablet Take 0.5 tablets (50 mg total) by mouth daily. Take 1/2 tablet (73m total) by mouth once daily.   tamoxifen (NOLVADEX) 20 MG tablet Take 1 tablet (20 mg total) by mouth daily.   Vitamin D3 (VITAMIN D) 25 MCG tablet TAKE 1 TABLET BY MOUTH EVERY DAY   Facility-Administered Encounter Medications as of 01/12/2020  Medication   denosumab (PROLIA) injection 60 mg     Objective:  Utilize data for adherence and care gaps to determine patients adherence quality for STAR measures.  Goals Addressed   None   Care Gaps - 1 (AWV Not performed)  Adherence Measures Diabetes - 100% (Januvia) 0 days missed HTN - 70-79% (Lisinopril) medication was discontinued midway through the year  Plan:   The patient has been provided with contact information for the care management team and has been advised to call with any health related questions or concerns.    CBeverly Milch PharmD Clinical Pharmacist [place]([phone] I have collaborated with the care management provider regarding care management and  care coordination activities outlined in this encounter and have reviewed this encounter including documentation in the note and care plan. I am certifying that I agree with the content of this note and encounter as supervising physician.

## 2020-01-13 ENCOUNTER — Other Ambulatory Visit (HOSPITAL_COMMUNITY)
Admission: RE | Admit: 2020-01-13 | Discharge: 2020-01-13 | Disposition: A | Discharge status: Home / Self Care | Payer: Medicare Other | Source: Ambulatory Visit | Attending: Gastroenterology | Admitting: Gastroenterology

## 2020-01-13 DIAGNOSIS — Z01812 Encounter for preprocedural laboratory examination: Secondary | ICD-10-CM | POA: Insufficient documentation

## 2020-01-13 DIAGNOSIS — Z20822 Contact with and (suspected) exposure to covid-19: Secondary | ICD-10-CM | POA: Diagnosis not present

## 2020-01-14 LAB — SARS CORONAVIRUS 2 (TAT 6-24 HRS): SARS Coronavirus 2: NEGATIVE

## 2020-01-16 ENCOUNTER — Telehealth: Payer: Self-pay | Admitting: Gastroenterology

## 2020-01-16 ENCOUNTER — Other Ambulatory Visit: Payer: Self-pay | Admitting: *Deleted

## 2020-01-16 DIAGNOSIS — I272 Pulmonary hypertension, unspecified: Secondary | ICD-10-CM

## 2020-01-16 DIAGNOSIS — R002 Palpitations: Secondary | ICD-10-CM

## 2020-01-16 DIAGNOSIS — D500 Iron deficiency anemia secondary to blood loss (chronic): Secondary | ICD-10-CM

## 2020-01-16 DIAGNOSIS — N1832 Chronic kidney disease, stage 3b: Secondary | ICD-10-CM

## 2020-01-16 DIAGNOSIS — I10 Essential (primary) hypertension: Secondary | ICD-10-CM

## 2020-01-16 DIAGNOSIS — M810 Age-related osteoporosis without current pathological fracture: Secondary | ICD-10-CM

## 2020-01-16 DIAGNOSIS — I480 Paroxysmal atrial fibrillation: Secondary | ICD-10-CM

## 2020-01-16 DIAGNOSIS — E118 Type 2 diabetes mellitus with unspecified complications: Secondary | ICD-10-CM

## 2020-01-16 DIAGNOSIS — I5032 Chronic diastolic (congestive) heart failure: Secondary | ICD-10-CM

## 2020-01-16 NOTE — Telephone Encounter (Signed)
The pt daughter states that the pt has not followed prep and has not stopped Eliquis.  She has been rescheduled to 12/27 with new instructions mailed to the home.  We discussed in length how to prep for the colon.  All questions answered and pt's daughter voiced understanding.       Due to recent COVID-19 restrictions implemented by our local and state authorities and in an effort to keep both patients and staff as safe as possible, our hospital system now requires COVID-19 testing prior to any scheduled hospital procedure. Please go to [facility], [place], [place] [id number] on 03/07/20 at  1005 am. This is a drive up testing site, you will not need to exit your vehicle.  You will not be billed at the time of testing but may receive a bill later depending on your insurance. The approximate cost of the test is $100. You must agree to quarantine from the time of your testing until the procedure date on 03/11/20 . This should include staying at home with ONLY the people you live with. Avoid take-out, grocery store shopping or leaving the house for any non-emergent reason. Failure to have your COVID-19 test done on the date and time you have been scheduled will result in cancellation of procedure. Please call our office at [phone] if you have any questions.   Whitney [name]   DOB :1927-11-27 MRN :[id number] Procedure Date:  03/11/20 Arrival Time:  830 am Procedure Time: 10 am     Location of Procedure: [facility] Entrance A   PREPARATION FOR COLONOSCOPY WITH SUPREP  On 01/11/20 (5 days prior to the procedure) stop eating nuts, seeds, popcorn, corn, beans, salads, or any raw vegetables.  Do not take any fiber supplements (e.g. Metamucil, Citrucel, and Benefiber).    THE DAY BEFORE PROCEDURE:   DATE: 03/10/20  1. Drink clear liquids the entire day - NO SOLID FOOD  2. Do not drink anything colored red or purple. Avoid juices with pulp. No orange juice, grapefruit, or tomato  juice.  3. Drink at least 64 oz. (8 glasses) of fluid/clear liquids during the day to prevent dehydration and help the prep work efficiently.  CLEAR LIQUIDS INCLUDE:  Water Jello  Ice Popsicles  Tea (sugar ok, no milk/cream) Powdered fruit flavored drinks  Coffee (sugar ok, no milk/cream) Gatorade  Juice: apple, white grape, white cranberry Lemonade  Clear bullion, consomme, broth Carbonated beverages (any kind)  Strained chicken noodle soup Hard Candy   FOLLOW THESE PREP INSTRUCTIONS, NOT INSTRUCTION ON SUPREP BOX.  At 6:00pm: COMPLETE STEPS 1 THROUGH 4 BELOW:  Step 1: Pour ONE (1) 6-ounce bottle of SUPREP liquid into the mixing container. Step 2: Add cool drinking water to the 16-ounce line on the container and mix.   Step 3: Drink ALL of the 16-ounce diluted prep over 15 minutes. Step 4: You MUST drink an additional two (2) or more 16-ounce containers of water over the next one (1) hour.   Continue to drink clear liquids until bedtime.   ______________________________________________________________________   DAY OF PROCEDURE:   DATE: 03/11/20  Beginning at  5 am (5 hours before the procedure):  COMPLETE STEPS 1 THROUGH 4 BELOW:  Step 1: Pour ONE (1) 6-ounce bottle of SUPREP liquid into the mixing container. Step 2: Add cool drinking water to the 16-ounce line on the container and mix.   Step 3: Drink ALL of the 16-ounce diluted prep over 15 minutes.    Step  4: You MUST drink an additional two (2) or more 16-ounce containers of water over the next one (1) hour.  You may drink clear liquids until 6 am (4 hours before procedure).     MEDICATION INSTRUCTIONS  Unless otherwise instructed, you should take regular prescription medications with a small sip of water as early as possible the morning of your procedure.  Diabetic patients - You will be contacted by the hospital staff prior to your procedure with instructions on what medications to take prior to your  procedure.   Please stop your Eliquis 2 days prior to the procedure.   Additional medication instructions: You will be contacted by the hospital staff prior to your procedure with instructions on what medications to take prior to your procedure.   ___________________________________________________________________  OTHER INSTRUCTIONS  You will need a responsible adult at least 84 years of age to accompany you and drive you home. This person must remain in the waiting room during your procedure.  Wear loose fitting clothing that is easily removed.  Leave jewelry and other valuables at home. However, you may wish to bring a book to read or an iPod/MP3 player to listen to music as you wait for your procedure to start.  Remove all body piercing jewelry and leave at home.  Do not wear  red or dark nail polish.  Total time from sign-in until discharge is approximately 2-3 hours.  You should go home directly after your procedure and rest. You can resume normal activities the day after your procedure.  The day of your procedure you should not:  Drive  Make legal decisions  Operate machinery  Drink alcohol  Return to work

## 2020-01-16 NOTE — Telephone Encounter (Signed)
Pt's granddaughter is requesting a call back from a nurse, caller states the pt took a blood thinner this morning and would like to know if we would need to reschedule the colonoscopy scheduled for tomorrow.

## 2020-01-18 ENCOUNTER — Other Ambulatory Visit: Payer: Self-pay

## 2020-01-18 DIAGNOSIS — K838 Other specified diseases of biliary tract: Secondary | ICD-10-CM

## 2020-01-18 DIAGNOSIS — J90 Pleural effusion, not elsewhere classified: Secondary | ICD-10-CM

## 2020-01-19 ENCOUNTER — Ambulatory Visit (INDEPENDENT_AMBULATORY_CARE_PROVIDER_SITE_OTHER): Payer: Medicare Other | Admitting: Endocrinology

## 2020-01-19 ENCOUNTER — Other Ambulatory Visit: Payer: Self-pay

## 2020-01-19 ENCOUNTER — Encounter: Payer: Self-pay | Admitting: Endocrinology

## 2020-01-19 ENCOUNTER — Other Ambulatory Visit (INDEPENDENT_AMBULATORY_CARE_PROVIDER_SITE_OTHER): Payer: Medicare Other

## 2020-01-19 ENCOUNTER — Other Ambulatory Visit: Payer: Medicare Other

## 2020-01-19 VITALS — BP 128/82 | HR 83 | Ht 66.0 in | Wt 166.6 lb

## 2020-01-19 DIAGNOSIS — E559 Vitamin D deficiency, unspecified: Secondary | ICD-10-CM

## 2020-01-19 DIAGNOSIS — E042 Nontoxic multinodular goiter: Secondary | ICD-10-CM

## 2020-01-19 DIAGNOSIS — K838 Other specified diseases of biliary tract: Secondary | ICD-10-CM

## 2020-01-19 DIAGNOSIS — E118 Type 2 diabetes mellitus with unspecified complications: Secondary | ICD-10-CM

## 2020-01-19 LAB — T4, FREE: Free T4: 1.44 ng/dL (ref 0.60–1.60)

## 2020-01-19 LAB — HEPATIC FUNCTION PANEL
ALT: 29 U/L (ref 0–35)
AST: 40 U/L — ABNORMAL HIGH (ref 0–37)
Albumin: 3.2 g/dL — ABNORMAL LOW (ref 3.5–5.2)
Alkaline Phosphatase: 31 U/L — ABNORMAL LOW (ref 39–117)
Bilirubin, Direct: 0.2 mg/dL (ref 0.0–0.3)
Total Bilirubin: 0.5 mg/dL (ref 0.2–1.2)
Total Protein: 5.7 g/dL — ABNORMAL LOW (ref 6.0–8.3)

## 2020-01-19 LAB — VITAMIN D 25 HYDROXY (VIT D DEFICIENCY, FRACTURES): VITD: 68.18 ng/mL (ref 30.00–100.00)

## 2020-01-19 LAB — POCT GLYCOSYLATED HEMOGLOBIN (HGB A1C): Hemoglobin A1C: 5.8 % — AB (ref 4.0–5.6)

## 2020-01-19 LAB — TSH: TSH: 1.27 u[IU]/mL (ref 0.35–4.50)

## 2020-01-19 NOTE — Progress Notes (Signed)
Patient ID: Whitney [name], female   DOB: Jul 31, 1927, 86 y.o.   MRN: [id number]   Reason for Appointment: Diabetes and other problems  History of Present Illness     Type 2 DIABETES MELITUS, date of diagnosis:  1983     Previous history: She had been taking metformin and Actos for several years.  She usually has had excellent control with upper normal A1c   She was previously on  Metformin and Actos; because of edema she was told to stop Actos Her metformin was stopped because of renal dysfunction and Amaryl because of hypoglycemia  Recent history:   Oral hypoglycemic drugs: Januvia 100 mg, half tablet daily  Her A1c is again in the normal range at 5.8, was 6.1  Current blood sugar patterns, management details:  She is here for her 84-monthfollow-up  Most of her blood sugar monitoring is being done in the late afternoon, she usually requires some help doing her checking  Recently highest blood sugar is only 202 and glucose in the lab a couple of months ago was 201  Not clear if she has checked her sugars after her evening meal which is at variable times  She has only 1 reading late morning which was 164  She thinks her appetite is fairly good  Weight is about the same as on her last visit 6 months ago  She knows to reduce amounts of high fat foods and sugar drinks Unable to do any physical activity       Monitors blood glucose:   less than once a day .    Glucometer: One Touch.           Blood sugars reviewed from monitor download  GLUCOSE range 125-202 with MEDIAN 160 Most blood sugars being done between 2 PM and 7 PM  PREVIOUS blood sugars range 118-214, average 160   Physical activity: exercise: Minimal         Wt Readings from Last 3 Encounters:  01/19/20 166 lb 9.6 oz (75.6 kg)  11/28/19 135 lb 12.8 oz (61.6 kg)  11/17/19 163 lb (73.9 kg)    LABS:  Lab Results  Component Value Date   HGBA1C 5.8 (A) 01/19/2020   HGBA1C 6.1 (A)  07/19/2019   HGBA1C 5.7 (A) 03/07/2019   Lab Results  Component Value Date   MICROALBUR 6.1 04/18/2018   LDLCALC 53 07/19/2019   CREATININE 1.40 (H) 01/12/2020     OTHER problems  are discussed in review of systems     Allergies as of 01/19/2020      Reactions   Tape Other (See Comments)   Skin is somewhat sensitive      Medication List       Accurate as of January 19, 2020  3:29 PM. If you have any questions, ask your nurse or doctor.        amiodarone 200 MG tablet Commonly known as: PACERONE Take 1 tablet (200 mg total) by mouth daily.   apixaban 2.5 MG Tabs tablet Commonly known as: Eliquis Take 1 tablet (2.5 mg total) by mouth 2 (two) times daily. Provider has put Patient on 2.5 mg twice daily. What changed:   how much to take  additional instructions   atorvastatin 20 MG tablet Commonly known as: LIPITOR Take 0.5 tablets (10 mg total) by mouth daily.   docusate sodium 100 MG capsule Commonly known as: COLACE Take 100 mg by mouth daily.   doxazosin 4 MG tablet Commonly  known as: CARDURA Take 0.5 tablets (2 mg total) by mouth daily.   ferrous sulfate 324 (65 Fe) MG Tbec TAKE 1 TABLET BY MOUTH IN THE MORNING AND 1 AT BEDTIME What changed: See the new instructions.   furosemide 40 MG tablet Commonly known as: LASIX Take 1 tablet (40 mg total) by mouth daily. What changed:   how much to take  when to take this   Klor-Con M20 20 MEQ tablet Generic drug: potassium chloride SA Take 20 mEq by mouth daily.   meclizine 12.5 MG tablet Commonly known as: ANTIVERT TAKE 1 TABLET BY MOUTH DAILY AS NEEDED FOR DIZZINESS   metoprolol succinate 100 MG 24 hr tablet Commonly known as: TOPROL-XL TAKE 1 TABLET BY MOUTH TWICE A DAY WITH OR IMMEDIATELY FOLLOWING A MEAL What changed: See the new instructions.   OneTouch Verio test strip Generic drug: glucose blood CHECK BLOOD SUGAR EVERY DAY AS DIRECTED   OneTouch Verio w/Device Kit Use to test blood  sugars daily.   polyethylene glycol 17 g packet Commonly known as: MIRALAX / GLYCOLAX Take 17 g by mouth daily. Also available OTC   sitaGLIPtin 100 MG tablet Commonly known as: Januvia Take 0.5 tablets (50 mg total) by mouth daily. Take 1/2 tablet (67m total) by mouth once daily. What changed: additional instructions   Suprep Bowel Prep Kit 17.5-3.13-1.6 GM/177ML Soln Generic drug: Na Sulfate-K Sulfate-Mg Sulf Take 1 kit by mouth as directed. For colonoscopy prep   tamoxifen 20 MG tablet Commonly known as: NOLVADEX Take 1 tablet (20 mg total) by mouth daily.   Vitamin D3 25 MCG tablet Commonly known as: Vitamin D TAKE 1 TABLET BY MOUTH EVERY DAY       Allergies:  Allergies  Allergen Reactions  . Tape Other (See Comments)    Skin is somewhat sensitive    Past Medical History:  Diagnosis Date  . Anemia   . Atrial fibrillation ([facility]   . Breast cancer ([facility] 10/[place]   right upper outer  . Cancer ([facility]    right breast  . Complication of anesthesia    slow to wake up  . Diabetes [place] without complication ([place]   . Dysrhythmia 10/15   AF  . Former smoker   . Full dentures   . Hyperlipidemia   . Hypertension   . Multinodular goiter   . Osteoporosis   . Radiation    Right Breast  . Thyroid disease    hypothyroidism  . Wears glasses     Past Surgical History:  Procedure Laterality Date  . ABDOMINAL HYSTERECTOMY    . AXILLARY LYMPH NODE DISSECTION Right 01/09/2014   Procedure: RIGHT AXILLARY LYMPH NODE DISECTION;  Surgeon: TErroll Luna MD;  [place] [facility]  Service: General;  Laterality: Right;  . BIOPSY  10/26/2019   Procedure: BIOPSY;  Surgeon: MRush LandmarkGTelford Nab, MD;  [facility];  Service: Gastroenterology;;  . BREAST LUMPECTOMY WITH RADIOACTIVE SEED LOCALIZATION Right 01/09/2014   Procedure: RIGHT BREAST SEED LOCALIZED LUMPECTOMY ;  Surgeon: TErroll Luna MD;  [place] [facility]  Service:  General;  Laterality: Right;  . COLONOSCOPY WITH PROPOFOL N/A 10/26/2019   Procedure: COLONOSCOPY WITH PROPOFOL;  Surgeon: MRush LandmarkGTelford Nab, MD;  [facility];  Service: Gastroenterology;  Laterality: N/A;  . ESOPHAGOGASTRODUODENOSCOPY (EGD) WITH PROPOFOL N/A 10/26/2019   Procedure: ESOPHAGOGASTRODUODENOSCOPY (EGD) WITH PROPOFOL;  Surgeon: MRush LandmarkGTelford Nab, MD;  [facility];  Service: Gastroenterology;  Laterality: N/A;  . EYE SURGERY  12/22/2009   cataracts  . HEMOSTASIS CLIP PLACEMENT  10/26/2019   Procedure: HEMOSTASIS CLIP PLACEMENT;  Surgeon: Irving Copas., MD;  [facility];  Service: Gastroenterology;;  marking for transverse lesion  . INTRAMEDULLARY (IM) NAIL INTERTROCHANTERIC Left 02/24/2014   Procedure: IM ROD LEFT HIP FX;  Surgeon: Alta Corning, MD;  Location: [place];  Service: Orthopedics;  Laterality: Left;  . left distal radius fracture     2020  . POLYPECTOMY  10/26/2019   Procedure: POLYPECTOMY;  Surgeon: Mansouraty, Telford Nab., MD;  [facility];  Service: Gastroenterology;;  . PORT-A-CATH REMOVAL Right 01/09/2014   Procedure: REMOVAL PORT-A-CATH;  Surgeon: [name], MD;  Location: [place];  Service: General;  Laterality: Right;  . PORTACATH PLACEMENT N/A 11/15/2013   Procedure: INSERTION PORT-A-CATH WITH ULTRA SOUND AND FLOROSCOPY;  Surgeon: [name], MD;  Location: [place];  Service: General;  Laterality: N/A;  . SUBMUCOSAL TATTOO INJECTION  10/26/2019   Procedure: SUBMUCOSAL TATTOO INJECTION;  Surgeon: Irving Copas., MD;  [facility];  Service: Gastroenterology;;  . TOTAL KNEE ARTHROPLASTY  2002   left    Family History  Problem Relation Age of Onset  . Colon cancer Neg Hx   . Esophageal cancer Neg Hx   . Inflammatory bowel disease Neg Hx   . Liver disease Neg Hx   . Pancreatic cancer Neg Hx   . Rectal cancer Neg Hx   . Stomach cancer Neg Hx     Social  History:  reports that she quit smoking about 61 years ago. She has a 5.00 pack-year smoking history. She has never used smokeless tobacco. She reports that she does not drink alcohol and does not use drugs.  Review of Systems:   Hypertension:  She is taking lisinopril 20 mg daily, 2 mg doxazosin, 100 mg of metoprolol  Followed by PCP   EDEMA of the legs, chronic, using elastic stockings, more on the left side  She is taking Lasix 80 mg daily and also metolazone for edema, prescribed by PCP   Renal function: Creatinine has been variable, last reading was improved  Lab Results  Component Value Date   CREATININE 1.40 (H) 01/12/2020   CREATININE 1.60 (H) 11/17/2019   CREATININE 1.42 (H) 11/09/2019     Lipids: Has been on long-term Lipitor with good control, needs follow-up labs    Lab Results  Component Value Date   CHOL 110 07/19/2019   HDL 40.10 07/19/2019   LDLCALC 53 07/19/2019   TRIG 82.0 07/19/2019   CHOLHDL 3 07/19/2019    She has had vitamin D deficiency, Treated with 2000 units vitamin D 3 Needs follow-up level  OSTEOPOROSIS: Her bone density checked by her PCP indicated osteoporosis and she has had a wrist fracture Has been started on Prolia PTH has been appropriate for renal function  GOITER: She has had a multinodular goiter for several years  Levothyroxine supplements were stopped in 5/16 and she has continued to be euthyroid  Her atrial arrhythmias have been treated with 200 mg of amiodarone long-term   Lab Results  Component Value Date   TSH 1.27 01/19/2020   TSH 0.74 07/19/2019   TSH 0.80 10/28/2018   FREET4 1.44 01/19/2020   FREET4 1.58 10/28/2018   FREET4 1.60 06/27/2018    Last foot exam in 8/19, patient does not want to take of her elastic stockings for the exam today   ADRENAL mass: Copy of the previous note as follows:  She had a CT scan done previously and was found to have a left adrenal mass  She was evaluated previously by a  PET scan after  diagnosis of breast cancer in 2015 She was found to have a 3.7 cm hypermetabolic left adrenal mass On a previous CT scan in 2011 she also had a 3.8 cm adrenal mass Cortisol level is normal in 2015 and 2016 No history of hypokalemia or severe hypertension/palpitations, headaches, flushing or significant fluctuations in blood pressure Had evaluation with metanephrines and overnight dexamethasone test indicating normal adrenal function  History of anemia, has not followed up with PCP:  Lab Results  Component Value Date   HGB 9.8 (L) 11/28/2019    Examination:   BP 128/82   Pulse 83   Ht 5' 6"  (1.676 m)   Wt 166 lb 9.6 oz (75.6 kg)   SpO2 96%   BMI 26.89 kg/m   Body mass index is 26.89 kg/m.   Thyroid is enlarged about twice normal on the right and 1-1/2 times in size on the left, nodular and firm No lymphadenopathy in the neck  Lower legs appear to have some edema, not examined because of elastic stockings  ASSESSMENT/ PLAN:  Diabetes type 2   See history of present illness for  discussion of current diabetes management, blood sugar patterns and problems identified  Her A1c is still excellent at 6.1  Has consistent control with Januvia 50 mg  A1c may be affected by renal dysfunction and anemia  Blood sugars are being checked only after dinner as before Her blood sugars are variable depending on her diet Although in the last month she has had readings as high as 472 her average blood sugar is 177 in the last month  Blood sugars are high when she is getting into some sweets Does not check blood sugars at any other time, is dependent on her granddaughter to check her sugars  Follow-up in 4 months on the same dose  Renal dysfunction: Labs will be checked today   Multinodular GOITER with normal thyroid levels Her exam is about the same as before and no dominant nodule felt  Need to recheck thyroid levels regularly since she is on  amiodarone  Hypertension: Blood pressure is consistently controlled on multiple medications  Anemia: Needs follow-up CBC and will draw today Also needs follow-up lipids  Again reminded her to schedule follow-up with PCP for regular follow-up    Elayne Snare 01/19/2020, 3:29 PM    Note: This office note was prepared with Dragon voice recognition system technology. Any transcriptional errors that result from this process are unintentional.  Addendum: Renal function slightly worse but in the usual range.  Forwarding CBC and iron results to PCP for follow-up            Patient ID: Whitney [name], female   DOB: 08-04-1927, 86 y.o.   MRN: [id number]   Reason for Appointment: Diabetes and other problems  History of Present Illness     Type 2 DIABETES MELITUS, date of diagnosis:  1983     Previous history: She had been taking metformin and Actos for several years.  She usually has had excellent control with upper normal A1c   She was previously on  Metformin and Actos; because of edema she was told to stop Actos Her metformin was stopped because of renal dysfunction and Amaryl because of hypoglycemia  Recent history:   Oral hypoglycemic drugs: Januvia 100 mg, half tablet daily  Her  A1c is again in the normal range at 6.1 compared to 5.7   Current blood sugar patterns, management details:  She has had occasional blood sugars over 300 and once over 400, she thinks this is from eating sweets  However in the last 2 weeks has not had significantly high readings  Again she checks her blood sugar readings only at various times in the evenings with the help of her granddaughter  On an average blood sugars are about the same as before  Her weight is about the same as last year  Usually trying to moderate portions of high-fat meals and avoid drinks with sugar  On reduced dose of Januvia with renal dysfunction but no recent creatinine available Unable to do much physical  activity       Monitors blood glucose:   less than once a day .    Glucometer: One Touch.           Blood sugars reviewed from monitor download  Evening blood sugars range 118-214, average 160 Previously 154-201 with AVERAGE 172   Physical activity: exercise: Minimal         Wt Readings from Last 3 Encounters:  01/19/20 166 lb 9.6 oz (75.6 kg)  11/28/19 135 lb 12.8 oz (61.6 kg)  11/17/19 163 lb (73.9 kg)    LABS:  Lab Results  Component Value Date   HGBA1C 5.8 (A) 01/19/2020   HGBA1C 6.1 (A) 07/19/2019   HGBA1C 5.7 (A) 03/07/2019   Lab Results  Component Value Date   MICROALBUR 6.1 04/18/2018   LDLCALC 53 07/19/2019   CREATININE 1.40 (H) 01/12/2020     OTHER problems  are discussed in review of systems     Allergies as of 01/19/2020      Reactions   Tape Other (See Comments)   Skin is somewhat sensitive      Medication List       Accurate as of January 19, 2020  3:29 PM. If you have any questions, ask your nurse or doctor.        amiodarone 200 MG tablet Commonly known as: PACERONE Take 1 tablet (200 mg total) by mouth daily.   apixaban 2.5 MG Tabs tablet Commonly known as: Eliquis Take 1 tablet (2.5 mg total) by mouth 2 (two) times daily. Provider has put Patient on 2.5 mg twice daily. What changed:   how much to take  additional instructions   atorvastatin 20 MG tablet Commonly known as: LIPITOR Take 0.5 tablets (10 mg total) by mouth daily.   docusate sodium 100 MG capsule Commonly known as: COLACE Take 100 mg by mouth daily.   doxazosin 4 MG tablet Commonly known as: CARDURA Take 0.5 tablets (2 mg total) by mouth daily.   ferrous sulfate 324 (65 Fe) MG Tbec TAKE 1 TABLET BY MOUTH IN THE MORNING AND 1 AT BEDTIME What changed: See the new instructions.   furosemide 40 MG tablet Commonly known as: LASIX Take 1 tablet (40 mg total) by mouth daily. What changed:   how much to take  when to take this   Klor-Con M20 20 MEQ  tablet Generic drug: potassium chloride SA Take 20 mEq by mouth daily.   meclizine 12.5 MG tablet Commonly known as: ANTIVERT TAKE 1 TABLET BY MOUTH DAILY AS NEEDED FOR DIZZINESS   metoprolol succinate 100 MG 24 hr tablet Commonly known as: TOPROL-XL TAKE 1 TABLET BY MOUTH TWICE A DAY WITH OR IMMEDIATELY FOLLOWING A MEAL What changed: See the  new instructions.   OneTouch Verio test strip Generic drug: glucose blood CHECK BLOOD SUGAR EVERY DAY AS DIRECTED   OneTouch Verio w/Device Kit Use to test blood sugars daily.   polyethylene glycol 17 g packet Commonly known as: MIRALAX / GLYCOLAX Take 17 g by mouth daily. Also available OTC   sitaGLIPtin 100 MG tablet Commonly known as: Januvia Take 0.5 tablets (50 mg total) by mouth daily. Take 1/2 tablet (27m total) by mouth once daily. What changed: additional instructions   Suprep Bowel Prep Kit 17.5-3.13-1.6 GM/177ML Soln Generic drug: Na Sulfate-K Sulfate-Mg Sulf Take 1 kit by mouth as directed. For colonoscopy prep   tamoxifen 20 MG tablet Commonly known as: NOLVADEX Take 1 tablet (20 mg total) by mouth daily.   Vitamin D3 25 MCG tablet Commonly known as: Vitamin D TAKE 1 TABLET BY MOUTH EVERY DAY       Allergies:  Allergies  Allergen Reactions  . Tape Other (See Comments)    Skin is somewhat sensitive    Past Medical History:  Diagnosis Date  . Anemia   . Atrial fibrillation ([facility]   . Breast cancer ([facility] 10/2013   right upper outer  . Cancer ([facility]    right breast  . Complication of anesthesia    slow to wake up  . Diabetes mellitus without complication ([facility]   . Dysrhythmia 10/15   AF  . Former smoker   . Full dentures   . Hyperlipidemia   . Hypertension   . Multinodular goiter   . Osteoporosis   . Radiation    Right Breast  . Thyroid disease    hypothyroidism  . Wears glasses     Past Surgical History:  Procedure Laterality Date  . ABDOMINAL HYSTERECTOMY    . AXILLARY LYMPH NODE DISSECTION  Right 01/09/2014   Procedure: RIGHT AXILLARY LYMPH NODE DISECTION;  Surgeon: TErroll Luna MD;  [place] [facility]  Service: General;  Laterality: Right;  . BIOPSY  10/26/2019   Procedure: BIOPSY;  Surgeon: MRush LandmarkGTelford Nab, MD;  [facility];  Service: Gastroenterology;;  . BREAST LUMPECTOMY WITH RADIOACTIVE SEED LOCALIZATION Right 01/09/2014   Procedure: RIGHT BREAST SEED LOCALIZED LUMPECTOMY ;  Surgeon: TErroll Luna MD;  [place] [facility]  Service: General;  Laterality: Right;  . COLONOSCOPY WITH PROPOFOL N/A 10/26/2019   Procedure: COLONOSCOPY WITH PROPOFOL;  Surgeon: MRush LandmarkGTelford Nab, MD;  [facility];  Service: Gastroenterology;  Laterality: N/A;  . ESOPHAGOGASTRODUODENOSCOPY (EGD) WITH PROPOFOL N/A 10/26/2019   Procedure: ESOPHAGOGASTRODUODENOSCOPY (EGD) WITH PROPOFOL;  Surgeon: MRush LandmarkGTelford Nab, MD;  [facility];  Service: Gastroenterology;  Laterality: N/A;  . EYE SURGERY  12/22/2009   cataracts  . HEMOSTASIS CLIP PLACEMENT  10/26/2019   Procedure: HEMOSTASIS CLIP PLACEMENT;  Surgeon: MIrving Copas, MD;  [facility];  Service: Gastroenterology;;  marking for transverse lesion  . INTRAMEDULLARY (IM) NAIL INTERTROCHANTERIC Left 02/24/2014   Procedure: IM ROD LEFT HIP FX;  Surgeon: JAlta Corning MD;  [facility]  Service: Orthopedics;  Laterality: Left;  . left distal radius fracture     2020  . POLYPECTOMY  10/26/2019   Procedure: POLYPECTOMY;  Surgeon: Mansouraty, GTelford Nab, MD;  [facility];  Service: Gastroenterology;;  . PORT-A-CATH REMOVAL Right 01/09/2014   Procedure: REMOVAL PORT-A-CATH;  Surgeon: TErroll Luna MD;  [place] [facility]  Service: General;  Laterality: Right;  . PORTACATH PLACEMENT N/A 11/15/2013   Procedure: INSERTION PORT-A-CATH WITH  ULTRA SOUND AND FLOROSCOPY;  Surgeon: [name], MD;  Location: [place];  Service:  General;  Laterality: N/A;  . SUBMUCOSAL TATTOO INJECTION  10/26/2019   Procedure: SUBMUCOSAL TATTOO INJECTION;  Surgeon: Irving Copas., MD;  [facility];  Service: Gastroenterology;;  . TOTAL KNEE ARTHROPLASTY  2002   left    Family History  Problem Relation Age of Onset  . Colon cancer Neg Hx   . Esophageal cancer Neg Hx   . Inflammatory bowel disease Neg Hx   . Liver disease Neg Hx   . Pancreatic cancer Neg Hx   . Rectal cancer Neg Hx   . Stomach cancer Neg Hx     Social History:  reports that she quit smoking about 61 years ago. She has a 5.00 pack-year smoking history. She has never used smokeless tobacco. She reports that she does not drink alcohol and does not use drugs.  Review of Systems:   Hypertension:  She is taking, 2 mg doxazosin, 100 mg of metoprolol Lisinopril was stopped Followed by PCP   EDEMA of the legs, chronic, using elastic stockings, more on the left side  She is taking Lasix 80 mg daily and also metolazone for edema, prescribed by PCP   Renal function: Creatinine has been variable, last reading was improved  Lab Results  Component Value Date   CREATININE 1.40 (H) 01/12/2020   CREATININE 1.60 (H) 11/17/2019   CREATININE 1.42 (H) 11/09/2019     Lipids: Has been on long-term Lipitor with good control     Lab Results  Component Value Date   CHOL 110 07/19/2019   HDL 40.10 07/19/2019   LDLCALC 53 07/19/2019   TRIG 82.0 07/19/2019   CHOLHDL 3 07/19/2019    She has had vitamin D deficiency, Treated with 2000 units vitamin D 3 Needs follow-up level  Lab Results  Component Value Date   VD25OH 68.18 01/19/2020   VD25OH 72.43 07/19/2019   VD25OH 24.74 (L) 10/28/2018   VD25OH 37.4 08/09/2013      OSTEOPOROSIS: Her bone density checked by her PCP indicated osteoporosis and she has had a wrist fracture Has been started on Prolia PTH has been appropriate for renal function   GOITER: She has had a multinodular  goiter for several years  Levothyroxine supplements were stopped in 5/16 and she has continued to be euthyroid  Her atrial arrhythmias have been treated with 200 mg of amiodarone long-term   Lab Results  Component Value Date   TSH 1.27 01/19/2020   TSH 0.74 07/19/2019   TSH 0.80 10/28/2018   FREET4 1.44 01/19/2020   FREET4 1.58 10/28/2018   FREET4 1.60 06/27/2018    Last foot exam in 8/19, patient does not want to take of her elastic stockings for the exam    ADRENAL mass: Copy of the previous note as follows:  She had a CT scan done previously and was found to have a left adrenal mass  She was evaluated previously by a PET scan after  diagnosis of breast cancer in 2015 She was found to have a 3.7 cm hypermetabolic left adrenal mass On a previous CT scan in 2011 she also had a 3.8 cm adrenal mass Cortisol level is normal in 2015 and 2016 No history of hypokalemia or severe hypertension/palpitations, headaches, flushing or significant fluctuations in blood pressure Had evaluation with metanephrines and overnight dexamethasone test indicating normal adrenal function  History of anemia, is going to have colonoscopy soon and has history of  GI bleeding  Lab Results  Component Value Date   HGB 9.8 (L) 11/28/2019    Examination:   BP 128/82   Pulse 83   Ht 5' 6"  (1.676 m)   Wt 166 lb 9.6 oz (75.6 kg)   SpO2 96%   BMI 26.89 kg/m   Body mass index is 26.89 kg/m.   She is alert and conversant Lower legs appear to have edema, she is wearing elastic stockings  ASSESSMENT/ PLAN:  Diabetes type 2   See history of present illness for  discussion of current diabetes management, blood sugar patterns and problems identified  Her A1c is still excellent at 5.8 A1c may be affected by her anemia and renal dysfunction  Has consistent control with Januvia 50 mg dose has been reduced because of renal dysfunction  Blood sugars at home are averaging about the same as last  visit Since she cannot check her sugars except with the help of her family these are mostly being done in the late afternoon at variable times and most of the readings are fairly good Her weight is stable and for her age her level of control is still excellent  Follow-up in 6 months  Renal dysfunction: Labs will be checked today   Multinodular GOITER with normal thyroid levels  Need to recheck thyroid levels regularly since she is on amiodarone  Hypertension: Blood pressure is appearing controlled  Vitamin D supplementation: Will need to recheck her vitamin D level    Elayne Snare 01/19/2020, 3:29 PM    Note: This office note was prepared with Dragon voice recognition system technology. Any transcriptional errors that result from this process are unintentional.

## 2020-01-19 NOTE — Progress Notes (Signed)
Please call to let her granddaughter know that the lab results are normal but need to confirm dose of vitamin D she is taking

## 2020-01-24 ENCOUNTER — Other Ambulatory Visit: Payer: Self-pay

## 2020-01-24 ENCOUNTER — Ambulatory Visit (HOSPITAL_COMMUNITY)
Admission: RE | Admit: 2020-01-24 | Discharge: 2020-01-24 | Disposition: A | Discharge status: Home / Self Care | Payer: Medicare Other | Source: Ambulatory Visit | Attending: Gastroenterology | Admitting: Gastroenterology

## 2020-01-24 DIAGNOSIS — K828 Other specified diseases of gallbladder: Secondary | ICD-10-CM | POA: Diagnosis not present

## 2020-01-24 DIAGNOSIS — K7689 Other specified diseases of liver: Secondary | ICD-10-CM | POA: Diagnosis not present

## 2020-01-24 DIAGNOSIS — K8020 Calculus of gallbladder without cholecystitis without obstruction: Secondary | ICD-10-CM | POA: Diagnosis not present

## 2020-01-24 DIAGNOSIS — N281 Cyst of kidney, acquired: Secondary | ICD-10-CM | POA: Diagnosis not present

## 2020-01-24 DIAGNOSIS — K838 Other specified diseases of biliary tract: Secondary | ICD-10-CM | POA: Diagnosis not present

## 2020-01-26 DIAGNOSIS — M810 Age-related osteoporosis without current pathological fracture: Secondary | ICD-10-CM | POA: Diagnosis not present

## 2020-01-26 DIAGNOSIS — D649 Anemia, unspecified: Secondary | ICD-10-CM | POA: Diagnosis not present

## 2020-01-26 DIAGNOSIS — I509 Heart failure, unspecified: Secondary | ICD-10-CM | POA: Diagnosis not present

## 2020-01-29 ENCOUNTER — Ambulatory Visit (INDEPENDENT_AMBULATORY_CARE_PROVIDER_SITE_OTHER): Payer: Medicare Other | Admitting: Family Medicine

## 2020-01-29 ENCOUNTER — Other Ambulatory Visit: Payer: Self-pay

## 2020-01-29 VITALS — BP 130/80 | HR 71 | Temp 97.7°F | Ht 66.0 in | Wt 164.0 lb

## 2020-01-29 DIAGNOSIS — I5032 Chronic diastolic (congestive) heart failure: Secondary | ICD-10-CM | POA: Diagnosis not present

## 2020-01-29 DIAGNOSIS — D500 Iron deficiency anemia secondary to blood loss (chronic): Secondary | ICD-10-CM | POA: Diagnosis not present

## 2020-01-29 DIAGNOSIS — I480 Paroxysmal atrial fibrillation: Secondary | ICD-10-CM | POA: Diagnosis not present

## 2020-01-29 DIAGNOSIS — I10 Essential (primary) hypertension: Secondary | ICD-10-CM | POA: Diagnosis not present

## 2020-01-29 NOTE — Progress Notes (Signed)
Subjective:    Patient ID: Whitney [name], female    DOB: 06-02-1927, 86 y.o.   MRN: [id number]  HPI  Patient recently admitted to the hospital.  I have copied relevant portions of the discharge summary below for my reference:   Brief H and P: For complete details please refer to admission H and P, but in brief Whitney [name] a 86 y.o.femalewith medical history significant forparoxysmal atrial fibrillation on Eliquis, breast cancer, type 2 diabetes mellitus, chronic diastolic CHF, and hypertension, presented to ED with rectal bleeding. Patient reported that for the past week, she has had recurrent episodes of rectal bleeding without any abdominal pain, nausea, or vomiting. Patient reported that she feels a sudden urge to defecate, sometimes waking her from sleep, and then sees red and maroon blood in the commode. This is happened several times over the past week, her daughter witnessed an episode prior to admission that appeared to involve a large volume of blood, and the patient was brought into the ED for evaluation. ED Course:Upon arrival to the ED, patient is found to be afebrile, saturating well on room air, with normal heart rate, and stable blood pressure. Chemistry panel is notable for creatinine of 1.41, similar to priors. CBC features a hemoglobin of 7.0, down from 9.4 in June 2021. Fecal occult blood testing is positive.    Hospital Course:  Acute lower GI bleed with acute blood loss anemia -Patient presented with 1 week of painless hematochezia, hemoglobin of 7.0, down from 9.4 in 08/2019 -EGD showed no gross lesions in the esophagus, 4 cm hiatal hernia, no source for GI bleeding -Colonoscopy showed severe diverticulosis in the rectosigmoid colon, and sigmoid colon and descending colon, evidence of diverticular spasm -Tagged RBC scan showed activity in the midline of the pelvis, difficult to exclude rectal bleeding -Status post 2 units packed RBCs,No further  bleeding episodes.  H&H currently stable, 7.9. Discussed with GI, [name], recommended to hold Eliquis for 7 days, awaiting biopsies.   Okay to discharge home.  Patient will have follow-up with [name], office will arrange appointment  Paroxysmal atrial fibrillation - CHADS-VASc at least 5 (age x2, gender, CHF, DM)  -Continue amiodarone, metoprolol -Eliquis currently on hold for 7 days  AKI on CKD stage IIIb -Creatinine baseline 1.2 -Creatinine 1.38, currently improving from 1.4.  Lisinopril discontinued   Chronic diastolic CHF - Appears compensated, Lasix decreased to 40 mg daily, will need to adjust dose at the time of follow-up.  Diabetes mellitus type 2, - A1c was 6.1% in May 2021    History of breast CA Continue tamoxifen  11/09/19 Reviewed the biopsy results from the colonoscopy performed in the hospital.  Patient had a tubular adenoma with high-grade dysplasia as well as active colitis.  GI has recommended a repeat colonoscopy to see if there is any focal invasion.  They also recommended repeat lab work including a CBC, iron studies, and a PT/INR.  I would be happy to schedule that at the present time.  The question is should the patient resume Eliquis.  CADS-VASc score is 5.  Therefore her annual risk of stroke is 7 to 8%.  Patient resumed Eliquis on August 23.  She has had one episode of a small amount of bright red blood.  She states that it is nowhere near as bad as when she went to the hospital.  She denies any chest pain.  She denies any shortness of breath.  She denies any syncope.  Today she is in atrial fibrillation however her rate is controlled.  She has +2 pitting edema in both legs to the knee.  There is no crackles in her lungs or signs of pulmonary edema.  At that time, my plan was: My biggest concern is whether she should stay on Eliquis.  She has had one episode of bright red blood per rectum since resuming the medication.  Her annual stroke risk is  between 7 and 8% based on her chads vas score.  Therefore if her hemoglobin is trending down again, I believe that we would need to discontinue the Eliquis as the risk outweighs the benefit.  If her hemoglobin is stable I would continue Eliquis unless she sees recurrent GI bleeding.  She also has significant swelling in her legs.  Her Lasix was decreased in the hospital due to renal insufficiency.  I will recheck a BMP today.  If her renal function has returned to her baseline I would recommend increasing her Lasix to 80 mg a day.  I will forward the lab work also to her gastroenterologist as well.  11/17/19 Since I last saw the patient she has gained 5 pounds.  She states that she has been taking Lasix 80 mg twice a day!.  I recommended that she only take 80 mg of Lasix once a day.  She has +1 swelling to the level of her knee bilaterally.  She denies any chest pain or shortness of breath.  She denies any dyspnea on exertion however she is sedentary.  She denies any further bright red blood per rectum.  She is consistently taking her Eliquis.  She denies any abdominal pain.  She denies any lightheadedness.  At that time, my plan was: I recommended the patient only take Lasix 80 mg once a day.  Recheck a BMP to monitor her renal function and potassium.  Check a CBC.  If her hemoglobin is stable this time, I will have the patient resume her regular appointment schedule unless she sees rectal bleeding.  Patient received her flu shot today.  However the patient is extremely weak.  She has a difficult time standing and walking to go to the restroom to use the toilet.  She also has a difficult time ambulating to the bathroom for bathing.  Her inability prevents her from performing her activities of daily living.  She is weak and deconditioned and has poor balance due to her anemia and frailty and therefore is unstable using a cane or a walker.  Her home is large enough to accommodate a manual wheelchair.  Patient will  be able to use the manual wheelchair to navigate her home to assist in moving around her home which will allow her to perform her activities of daily living such as bathing and toileting.  She is willing to use a wheelchair.  01/29/20 Patient is still on Eliquis.  She denies any bright red blood per rectum.  She denies any black tarry stool.  She is also on Lasix 80 mg a day for diastolic heart failure.  She has +1 pitting edema to the level of both knees despite wearing compression hose.  However she denies any shortness of breath or chest pain at rest.  She denies any orthopnea.  She denies any paroxysmal nocturnal dyspnea.  She recently saw her endocrinologist and her A1c was 5.8 and outstanding.  She is overdue to recheck a CBC.  Her family does not think that she is taking an iron tablet.  Past Medical History:  Diagnosis Date  . Anemia   . Atrial fibrillation ([place])   . Breast cancer ([place]) 10/2013   right upper outer  . Cancer ([place])    right breast  . Complication of anesthesia    slow to wake up  . Diabetes mellitus without complication ([place])   . Dysrhythmia 10/15   AF  . Former smoker   . Full dentures   . Hyperlipidemia   . Hypertension   . Multinodular goiter   . Osteoporosis   . Radiation    Right Breast  . Thyroid disease    hypothyroidism  . Wears glasses    Past Surgical History:  Procedure Laterality Date  . ABDOMINAL HYSTERECTOMY    . AXILLARY LYMPH NODE DISSECTION Right 01/09/2014   Procedure: RIGHT AXILLARY LYMPH NODE DISECTION;  Surgeon: [name], MD;  Location: [facility];  Service: General;  Laterality: Right;  . BIOPSY  10/26/2019   Procedure: BIOPSY;  Surgeon: Rush Landmark Telford Nab., MD;  Location: [facility];  Service: Gastroenterology;;  . BREAST LUMPECTOMY WITH RADIOACTIVE SEED LOCALIZATION Right 01/09/2014   Procedure: RIGHT BREAST SEED LOCALIZED LUMPECTOMY ;  Surgeon: [name], MD;  Location: [facility];   Service: General;  Laterality: Right;  . COLONOSCOPY WITH PROPOFOL N/A 10/26/2019   Procedure: COLONOSCOPY WITH PROPOFOL;  Surgeon: Rush Landmark Telford Nab., MD;  Location: [facility];  Service: Gastroenterology;  Laterality: N/A;  . ESOPHAGOGASTRODUODENOSCOPY (EGD) WITH PROPOFOL N/A 10/26/2019   Procedure: ESOPHAGOGASTRODUODENOSCOPY (EGD) WITH PROPOFOL;  Surgeon: Rush Landmark Telford Nab., MD;  Location: [facility];  Service: Gastroenterology;  Laterality: N/A;  . EYE SURGERY  12/22/2009   cataracts  . HEMOSTASIS CLIP PLACEMENT  10/26/2019   Procedure: HEMOSTASIS CLIP PLACEMENT;  Surgeon: Irving Copas., MD;  Location: [facility];  Service: Gastroenterology;;  marking for transverse lesion  . INTRAMEDULLARY (IM) NAIL INTERTROCHANTERIC Left 02/24/2014   Procedure: IM ROD LEFT HIP FX;  Surgeon: Alta Corning, MD;  Location: [facility];  Service: Orthopedics;  Laterality: Left;  . left distal radius fracture     2020  . POLYPECTOMY  10/26/2019   Procedure: POLYPECTOMY;  Surgeon: Mansouraty, Telford Nab., MD;  Location: [facility];  Service: Gastroenterology;;  . PORT-A-CATH REMOVAL Right 01/09/2014   Procedure: REMOVAL PORT-A-CATH;  Surgeon: [name], MD;  Location: [facility];  Service: General;  Laterality: Right;  . PORTACATH PLACEMENT N/A 11/15/2013   Procedure: INSERTION PORT-A-CATH WITH ULTRA SOUND AND FLOROSCOPY;  Surgeon: [name], MD;  Location: [facility];  Service: General;  Laterality: N/A;  . SUBMUCOSAL TATTOO INJECTION  10/26/2019   Procedure: SUBMUCOSAL TATTOO INJECTION;  Surgeon: Irving Copas., MD;  Location: [facility];  Service: Gastroenterology;;  . TOTAL KNEE ARTHROPLASTY  2002   left   Current Outpatient Medications on File Prior to Visit  Medication Sig Dispense Refill  . amiodarone (PACERONE) 200 MG tablet Take 1 tablet (200 mg total) by mouth daily. 90 tablet 3  . apixaban (ELIQUIS) 2.5 MG TABS tablet Take 1 tablet (2.5 mg total) by  mouth 2 (two) times daily. Provider has put Patient on 2.5 mg twice daily. (Patient taking differently: Take 5 mg by mouth 2 (two) times daily. )    . atorvastatin (LIPITOR) 20 MG tablet Take 0.5 tablets (10 mg total) by mouth daily.    . Blood Glucose Monitoring Suppl (ONETOUCH VERIO) w/Device KIT Use to test blood sugars daily. 1 kit 0  . docusate sodium (COLACE) 100  MG capsule Take 100 mg by mouth daily.    Marland Kitchen doxazosin (CARDURA) 4 MG tablet Take 0.5 tablets (2 mg total) by mouth daily.    . ferrous sulfate 324 (65 Fe) MG TBEC TAKE 1 TABLET BY MOUTH IN THE MORNING AND 1 AT BEDTIME (Patient taking differently: Take 324 mg by mouth daily. ) 180 tablet 1  . furosemide (LASIX) 40 MG tablet Take 1 tablet (40 mg total) by mouth daily. (Patient taking differently: Take 80 mg by mouth daily. ) 360 tablet 2  . KLOR-CON M20 20 MEQ tablet Take 20 mEq by mouth daily.    . meclizine (ANTIVERT) 12.5 MG tablet TAKE 1 TABLET BY MOUTH DAILY AS NEEDED FOR DIZZINESS 30 tablet 2  . metoprolol succinate (TOPROL-XL) 100 MG 24 hr tablet TAKE 1 TABLET BY MOUTH TWICE A DAY WITH OR IMMEDIATELY FOLLOWING A MEAL (Patient taking differently: Take 100 mg by mouth in the morning and at bedtime. ) 180 tablet 1  . Na Sulfate-K Sulfate-Mg Sulf (SUPREP BOWEL PREP KIT) 17.5-3.13-1.6 GM/177ML SOLN Take 1 kit by mouth as directed. For colonoscopy prep 354 mL 0  . ONETOUCH VERIO test strip CHECK BLOOD SUGAR EVERY DAY AS DIRECTED 100 strip 2  . polyethylene glycol (MIRALAX / GLYCOLAX) 17 g packet Take 17 g by mouth daily. Also available OTC 30 each 0  . sitaGLIPtin (JANUVIA) 100 MG tablet Take 0.5 tablets (50 mg total) by mouth daily. Take 1/2 tablet ($RemoveBef'50mg'yxnvkACSWx$  total) by mouth once daily. (Patient taking differently: Take 50 mg by mouth daily. ) 90 tablet 1  . tamoxifen (NOLVADEX) 20 MG tablet Take 1 tablet (20 mg total) by mouth daily. 90 tablet 3  . Vitamin D3 (VITAMIN D) 25 MCG tablet TAKE 1 TABLET BY MOUTH EVERY DAY (Patient taking  differently: Take 1,000 Units by mouth daily. ) 180 tablet 2   Current Facility-Administered Medications on File Prior to Visit  Medication Dose Route Frequency Provider Last Rate Last Admin  . denosumab (PROLIA) injection 60 mg  60 mg Subcutaneous Q6 months [name], MD   60 mg at 10/05/19 5400   Allergies  Allergen Reactions  . Tape Other (See Comments)    Skin is somewhat sensitive   Social History   Socioeconomic History  . Marital status: Widowed    Spouse name: Not on file  . Number of children: 4  . Years of education: Not on file  . Highest education level: Not on file  Occupational History  . Not on file  Tobacco Use  . Smoking status: Former Smoker    Packs/day: 1.00    Years: 5.00    Pack years: 5.00    Quit date: 11/14/1958    Years since quitting: 61.2  . Smokeless tobacco: Never Used  Vaping Use  . Vaping Use: Never used  Substance and Sexual Activity  . Alcohol use: No    Alcohol/week: 0.0 standard drinks  . Drug use: No  . Sexual activity: Never    Comment: menarche age 27, P68, first birth age 9, no HRT, menopause age 38  Other Topics Concern  . Not on file  Social History Narrative  . Not on file   Social Determinants of Health   Financial Resource Strain: Low Risk   . Difficulty of Paying Living Expenses: Not very hard  Food Insecurity:   . Worried About Charity fundraiser in the Last Year: Not on file  . Ran Out of Food in the Last  Year: Not on file  Transportation Needs:   . Lack of Transportation (Medical): Not on file  . Lack of Transportation (Non-Medical): Not on file  Physical Activity:   . Days of Exercise per Week: Not on file  . Minutes of Exercise per Session: Not on file  Stress:   . Feeling of Stress : Not on file  Social Connections:   . Frequency of Communication with Friends and Family: Not on file  . Frequency of Social Gatherings with Friends and Family: Not on file  . Attends Religious Services: Not on file  .  Active Member of Clubs or Organizations: Not on file  . Attends Archivist Meetings: Not on file  . Marital Status: Not on file  Intimate Partner Violence:   . Fear of Current or Ex-Partner: Not on file  . Emotionally Abused: Not on file  . Physically Abused: Not on file  . Sexually Abused: Not on file      Review of Systems  All other systems reviewed and are negative.      Objective:   Physical Exam Vitals reviewed.  Constitutional:      General: She is not in acute distress.    Appearance: Normal appearance. She is not ill-appearing or toxic-appearing.  Cardiovascular:     Rate and Rhythm: Normal rate. Rhythm irregular.     Heart sounds: Normal heart sounds.  Pulmonary:     Effort: Pulmonary effort is normal. No respiratory distress.     Breath sounds: Normal breath sounds. No wheezing, rhonchi or rales.  Chest:     Chest wall: No tenderness.  Abdominal:     General: Abdomen is flat. Bowel sounds are normal. There is no distension.     Palpations: Abdomen is soft.     Tenderness: There is no abdominal tenderness. There is no guarding or rebound.  Musculoskeletal:     Right lower leg: Edema present.     Left lower leg: Edema present.  Neurological:     Mental Status: She is alert.           Assessment & Plan:  Chronic diastolic CHF (congestive heart failure), NYHA class 2 ([place]) - Plan: CBC with Differential/Platelet, COMPLETE METABOLIC PANEL WITH GFR  Essential hypertension, benign  PAF (paroxysmal atrial fibrillation) ([place]) - Plan: CBC with Differential/Platelet, COMPLETE METABOLIC PANEL WITH GFR  Iron deficiency anemia due to chronic blood loss - Plan: CBC with Differential/Platelet, COMPLETE METABOLIC PANEL WITH GFR  Patient has a repeat colonoscopy scheduled for December.  She is currently on Eliquis.  If the hemoglobin has dropped again I would discontinue Eliquis indefinitely.  If her hemoglobin is stable we will continue Eliquis at the  present dosage.  Also monitor renal function and potassium with a CMP.  Diabetes is well controlled.  Immunizations are up-to-date.  Blood pressure today is excellent at 130/80.

## 2020-01-30 LAB — CBC WITH DIFFERENTIAL/PLATELET
Absolute Monocytes: 332 cells/uL (ref 200–950)
Basophils Absolute: 12 cells/uL (ref 0–200)
Basophils Relative: 0.3 %
Eosinophils Absolute: 20 cells/uL (ref 15–500)
Eosinophils Relative: 0.5 %
HCT: 30.9 % — ABNORMAL LOW (ref 35.0–45.0)
Hemoglobin: 10.3 g/dL — ABNORMAL LOW (ref 11.7–15.5)
Lymphs Abs: 519 cells/uL — ABNORMAL LOW (ref 850–3900)
MCH: 34 pg — ABNORMAL HIGH (ref 27.0–33.0)
MCHC: 33.3 g/dL (ref 32.0–36.0)
MCV: 102 fL — ABNORMAL HIGH (ref 80.0–100.0)
MPV: 10.8 fL (ref 7.5–12.5)
Monocytes Relative: 8.5 %
Neutro Abs: 3019 cells/uL (ref 1500–7800)
Neutrophils Relative %: 77.4 %
Platelets: 159 10*3/uL (ref 140–400)
RBC: 3.03 10*6/uL — ABNORMAL LOW (ref 3.80–5.10)
RDW: 12.8 % (ref 11.0–15.0)
Total Lymphocyte: 13.3 %
WBC: 3.9 10*3/uL (ref 3.8–10.8)

## 2020-01-30 LAB — COMPLETE METABOLIC PANEL WITH GFR
AG Ratio: 1.4 (calc) (ref 1.0–2.5)
ALT: 29 U/L (ref 6–29)
AST: 41 U/L — ABNORMAL HIGH (ref 10–35)
Albumin: 3.4 g/dL — ABNORMAL LOW (ref 3.6–5.1)
Alkaline phosphatase (APISO): 33 U/L — ABNORMAL LOW (ref 37–153)
BUN/Creatinine Ratio: 20 (calc) (ref 6–22)
BUN: 25 mg/dL (ref 7–25)
CO2: 27 mmol/L (ref 20–32)
Calcium: 8.9 mg/dL (ref 8.6–10.4)
Chloride: 104 mmol/L (ref 98–110)
Creat: 1.22 mg/dL — ABNORMAL HIGH (ref 0.60–0.88)
GFR, Est African American: 45 mL/min/{1.73_m2} — ABNORMAL LOW (ref >=60)
GFR, Est Non African American: 39 mL/min/{1.73_m2} — ABNORMAL LOW (ref >=60)
Globulin: 2.5 g/dL (calc) (ref 1.9–3.7)
Glucose, Bld: 201 mg/dL — ABNORMAL HIGH (ref 65–99)
Potassium: 4.2 mmol/L (ref 3.5–5.3)
Sodium: 141 mmol/L (ref 135–146)
Total Bilirubin: 0.6 mg/dL (ref 0.2–1.2)
Total Protein: 5.9 g/dL — ABNORMAL LOW (ref 6.1–8.1)

## 2020-01-31 ENCOUNTER — Telehealth: Payer: Self-pay

## 2020-01-31 NOTE — Telephone Encounter (Signed)
Pt grand daughter called to make sure she was getting the correct vitamins for Pt.

## 2020-02-01 ENCOUNTER — Other Ambulatory Visit: Payer: Self-pay

## 2020-02-01 DIAGNOSIS — I272 Pulmonary hypertension, unspecified: Secondary | ICD-10-CM

## 2020-02-01 DIAGNOSIS — K5790 Diverticulosis of intestine, part unspecified, without perforation or abscess without bleeding: Secondary | ICD-10-CM

## 2020-02-01 DIAGNOSIS — I5032 Chronic diastolic (congestive) heart failure: Secondary | ICD-10-CM

## 2020-02-01 DIAGNOSIS — I10 Essential (primary) hypertension: Secondary | ICD-10-CM

## 2020-02-01 MED ORDER — APIXABAN 2.5 MG PO TABS: 2.5000 mg | ORAL_TABLET | Freq: Two times a day (BID) | ORAL | 3 refills | Status: DC

## 2020-02-05 ENCOUNTER — Other Ambulatory Visit: Payer: Self-pay

## 2020-02-05 ENCOUNTER — Other Ambulatory Visit: Payer: Self-pay | Admitting: Family Medicine

## 2020-02-05 ENCOUNTER — Other Ambulatory Visit: Payer: Self-pay | Admitting: Endocrinology

## 2020-02-05 DIAGNOSIS — I272 Pulmonary hypertension, unspecified: Secondary | ICD-10-CM

## 2020-02-05 DIAGNOSIS — D500 Iron deficiency anemia secondary to blood loss (chronic): Secondary | ICD-10-CM

## 2020-02-05 DIAGNOSIS — D649 Anemia, unspecified: Secondary | ICD-10-CM

## 2020-02-05 DIAGNOSIS — K5790 Diverticulosis of intestine, part unspecified, without perforation or abscess without bleeding: Secondary | ICD-10-CM

## 2020-02-05 DIAGNOSIS — I10 Essential (primary) hypertension: Secondary | ICD-10-CM

## 2020-02-05 DIAGNOSIS — I5032 Chronic diastolic (congestive) heart failure: Secondary | ICD-10-CM

## 2020-02-05 MED ORDER — APIXABAN 2.5 MG PO TABS: 2.5000 mg | ORAL_TABLET | Freq: Two times a day (BID) | ORAL | 3 refills | Status: DC

## 2020-02-05 MED ORDER — AMIODARONE HCL 200 MG PO TABS: 200.0000 mg | ORAL_TABLET | Freq: Every day | ORAL | 3 refills | Status: AC

## 2020-02-05 MED ORDER — FERROUS SULFATE 324 (65 FE) MG PO TBEC: 324.0000 mg | DELAYED_RELEASE_TABLET | Freq: Once | ORAL | 3 refills | Status: DC

## 2020-02-06 ENCOUNTER — Other Ambulatory Visit: Payer: Self-pay

## 2020-02-06 MED ORDER — FUROSEMIDE 40 MG PO TABS
80.0000 mg | ORAL_TABLET | Freq: Every day | ORAL | 2 refills | Status: DC
Start: 2020-02-06 — End: 2020-07-24

## 2020-02-07 ENCOUNTER — Telehealth: Payer: Self-pay | Admitting: Family Medicine

## 2020-02-07 NOTE — Telephone Encounter (Signed)
Pt wasn't able to find  [id number] over the counter if possible can get prescription be fax over to the pharmacy

## 2020-02-07 NOTE — Telephone Encounter (Signed)
Please Advise

## 2020-02-07 NOTE — Telephone Encounter (Signed)
Daughter call the mediation has been call in disregard the refill

## 2020-02-19 ENCOUNTER — Other Ambulatory Visit: Payer: Self-pay | Admitting: Family Medicine

## 2020-02-20 ENCOUNTER — Other Ambulatory Visit: Payer: Self-pay

## 2020-02-20 DIAGNOSIS — I10 Essential (primary) hypertension: Secondary | ICD-10-CM

## 2020-02-20 DIAGNOSIS — I272 Pulmonary hypertension, unspecified: Secondary | ICD-10-CM

## 2020-02-20 DIAGNOSIS — K5790 Diverticulosis of intestine, part unspecified, without perforation or abscess without bleeding: Secondary | ICD-10-CM

## 2020-02-20 DIAGNOSIS — I5032 Chronic diastolic (congestive) heart failure: Secondary | ICD-10-CM

## 2020-02-20 MED ORDER — APIXABAN 2.5 MG PO TABS: 2.5000 mg | ORAL_TABLET | Freq: Two times a day (BID) | ORAL | 3 refills | Status: DC

## 2020-02-25 DIAGNOSIS — M810 Age-related osteoporosis without current pathological fracture: Secondary | ICD-10-CM | POA: Diagnosis not present

## 2020-02-25 DIAGNOSIS — I509 Heart failure, unspecified: Secondary | ICD-10-CM | POA: Diagnosis not present

## 2020-02-25 DIAGNOSIS — D649 Anemia, unspecified: Secondary | ICD-10-CM | POA: Diagnosis not present

## 2020-02-26 ENCOUNTER — Other Ambulatory Visit: Payer: Self-pay | Admitting: *Deleted

## 2020-02-26 ENCOUNTER — Telehealth: Payer: Self-pay | Admitting: Family Medicine

## 2020-02-26 DIAGNOSIS — I272 Pulmonary hypertension, unspecified: Secondary | ICD-10-CM

## 2020-02-26 DIAGNOSIS — K5790 Diverticulosis of intestine, part unspecified, without perforation or abscess without bleeding: Secondary | ICD-10-CM

## 2020-02-26 DIAGNOSIS — I5032 Chronic diastolic (congestive) heart failure: Secondary | ICD-10-CM

## 2020-02-26 DIAGNOSIS — I10 Essential (primary) hypertension: Secondary | ICD-10-CM

## 2020-02-26 MED ORDER — APIXABAN 2.5 MG PO TABS: 2.5000 mg | ORAL_TABLET | Freq: Two times a day (BID) | ORAL | 3 refills | Status: DC

## 2020-02-26 NOTE — Telephone Encounter (Signed)
Patient's granddaughter stopped by requesting arefill on eliquis sent to [facility]. I seen where it was called in on 12/7- but she states pharmacy said they don't have it.  CB# [phone]

## 2020-02-27 ENCOUNTER — Other Ambulatory Visit: Payer: Self-pay

## 2020-02-27 DIAGNOSIS — I5032 Chronic diastolic (congestive) heart failure: Secondary | ICD-10-CM

## 2020-02-27 DIAGNOSIS — I10 Essential (primary) hypertension: Secondary | ICD-10-CM

## 2020-02-27 DIAGNOSIS — I272 Pulmonary hypertension, unspecified: Secondary | ICD-10-CM

## 2020-02-27 DIAGNOSIS — K5790 Diverticulosis of intestine, part unspecified, without perforation or abscess without bleeding: Secondary | ICD-10-CM

## 2020-02-27 MED ORDER — APIXABAN 2.5 MG PO TABS: 2.5000 mg | ORAL_TABLET | Freq: Two times a day (BID) | ORAL | 3 refills | Status: DC

## 2020-03-06 ENCOUNTER — Encounter (HOSPITAL_COMMUNITY): Payer: Self-pay | Admitting: Gastroenterology

## 2020-03-06 NOTE — Progress Notes (Signed)
Spoke with patient's Granddaughter Aggie Hacker cell [phone] for PAT information.  PCP - Dr Jenna Luo Cardiologist - n/a Gertie [facility]ey - Dr Rush Landmark Internal Med - Dr Elayne Snare Oncology - Dr Nicholas Lose  Chest x-ray - n/a EKG - DOS 03/11/20 Stress Test - n/a ECHO - 12/12/13 Cardiac Cath - n/a  Fasting Blood Sugar - unknown Checks Blood Sugar 1x per day at night.  . Do not take oral diabetes medicines (Januvia) the morning of surgery.  . If your blood sugar is less than 70 mg/dL, you will need to treat for low blood sugar: o Treat a low blood sugar (less than 70 mg/dL) with  cup of clear juice (cranberry or apple), 4 glucose tablets, OR glucose gel. o Recheck blood sugar in 15 minutes after treatment (to make sure it is greater than 70 mg/dL). If your blood sugar is not greater than 70 mg/dL on recheck, call [phone] for further instructions.  Blood Thinner Instructions:  Per MD, stop Eliquis 2 days prior to procedure with last dose on 03/08/20.  Anesthesia review: Yes  STOP now taking any Aspirin (unless otherwise instructed by your surgeon), Aleve, Naproxen, Ibuprofen, Motrin, Advil, Goody's, BC's, all herbal medications, fish oil, and all vitamins.   Coronavirus Screening Covid test is scheduled on 03/07/20. Do you have any of the following symptoms:  Cough yes/no: No Fever (>100.23F)  yes/no: No Runny nose yes/no: No Sore throat yes/no: No Difficulty breathing/shortness of breath  yes/no: No  Have you traveled in the last 14 days and where? yes/no: No  Granddaughter [place] verbalized understanding of instructions that were given vi phone.

## 2020-03-07 ENCOUNTER — Other Ambulatory Visit (HOSPITAL_COMMUNITY)
Admission: RE | Admit: 2020-03-07 | Discharge: 2020-03-07 | Disposition: A | Discharge status: Home / Self Care | Payer: Medicare Other | Source: Ambulatory Visit | Attending: Gastroenterology | Admitting: Gastroenterology

## 2020-03-07 DIAGNOSIS — Z20822 Contact with and (suspected) exposure to covid-19: Secondary | ICD-10-CM | POA: Insufficient documentation

## 2020-03-07 DIAGNOSIS — Z01812 Encounter for preprocedural laboratory examination: Secondary | ICD-10-CM | POA: Diagnosis not present

## 2020-03-07 LAB — SARS CORONAVIRUS 2 (TAT 6-24 HRS): SARS Coronavirus 2: NEGATIVE

## 2020-03-07 NOTE — Anesthesia Preprocedure Evaluation (Addendum)
Anesthesia Evaluation  Patient identified by MRN, date of birth, ID band Patient awake    Reviewed: Allergy & Precautions, NPO status , Patient's Chart, lab work & pertinent test results, reviewed documented beta blocker date and time   History of Anesthesia Complications Negative for: history of anesthetic complications  Airway Mallampati: II  TM Distance: >3 FB Neck ROM: Full    Dental  (+) Dental Advisory Given, Missing   Pulmonary former smoker,  03/07/2020 SARS coronavirus NEG   breath sounds clear to auscultation       Cardiovascular hypertension, Pt. on medications and Pt. on home beta blockers + dysrhythmias Atrial Fibrillation  Rhythm:Irregular Rate:Normal  '15 ECHO: EF 60-65%, mild AI, mild MR, mod TR   Neuro/Psych negative neurological ROS     GI/Hepatic negative GI ROS, Neg liver ROS,   Endo/Other  diabetes, Oral Hypoglycemic AgentsHypothyroidism   Renal/GU Renal InsufficiencyRenal disease     Musculoskeletal  (+) Arthritis ,   Abdominal   Peds  Hematology eliquis   Anesthesia Other Findings H/o breast cancer  Reproductive/Obstetrics                           Anesthesia Physical Anesthesia Plan  ASA: III  Anesthesia Plan: MAC   Post-op Pain Management:    Induction:   PONV Risk Score and Plan: 2  Airway Management Planned: Natural Airway and Simple Face Mask  Additional Equipment: None  Intra-op Plan:   Post-operative Plan:   Informed Consent: I have reviewed the patients History and Physical, chart, labs and discussed the procedure including the risks, benefits and alternatives for the proposed anesthesia with the patient or authorized representative who has indicated his/her understanding and acceptance.     Dental advisory given  Plan Discussed with: CRNA and Surgeon  Anesthesia Plan Comments: (PAT note written 03/07/2020 by Myra Gianotti, PA-C. )       Anesthesia Quick Evaluation

## 2020-03-07 NOTE — Progress Notes (Signed)
Anesthesia Chart Review: SAME DAY WORK-UP (ENDO)  Case: [id number] Date/Time: 03/11/20 1000   Procedures:      COLONOSCOPY WITH PROPOFOL- Ultraslim scope (N/A )     ENDOSCOPIC MUCOSAL RESECTION (N/A )   Anesthesia type: Monitor Anesthesia Care   Pre-op diagnosis: Hx of Colon polyps   Location: [facility]OM [facility] / [facility]   Surgeons: Mansouraty, Telford Nab., MD      DISCUSSION: Patient is a 86 year old female scheduled for the above procedure.  She previously underwent colonoscopy and EGD on 10/26/2019 during admission for GI bleed (HGB low 6.7). Colon biopsy then showed active colitis with focal erosion and high grade glandular dysplasia involving fragments of tubular adenoma concerning for invasive adenocarcinoma. Repeat colonoscopy/above procedure recommended.    History includes former smoker, HTN, HLD, hypothyroidism, multinodular goiter, anemia, right breast cancer (Port-a-cath 11/15/13; s/p PAC explantation, right breast lumpectomy, axillary LN dissection 01/09/14), afib/PAF (diagnosed with afib with RVR 11/2013 in setting of chemoradiation), chronic diastolic CHF, DM2.  Last evaluation by PCP [name], MD 01/29/20. He is aware of plans for repeat colonoscopy. He wrote that if her Eliquis drops again then could likely discontinue Eliquis indefinitely; however, H/H stable at 10.3/30.9 at that time. Iron and B12 supplements in hopes continue to improve blood counts. Last EKG is > 1 year ago.   Last Eliquis scheduled for 03/08/20. Pre-procedure COVID-19 test is scheduled for 03/07/20. Anesthesia team to evaluate on the day of surgery.    VS: Ht 5' 6"  (1.676 m)   Wt 74.4 kg   LMP  (LMP Unknown)   BMI 26.47 kg/m   BP Readings from Last 3 Encounters:  01/29/20 130/80  01/19/20 128/82  11/28/19 126/80   Pulse Readings from Last 3 Encounters:  01/29/20 71  01/19/20 83  11/28/19 68    PROVIDERS: [name], MD is PCP  Elayne Snare, MD is endocrinologist Nicholas Lose, MD  is HEM-ONC  - She was evaluated in 2015 by cardiologist Ena Dawley, MD for afib and chronic diastolic CHF. Dr. Dennard Schaumann now manages Eliquis.   LABS: Per GI. AS of 01/29/20. Cr 1.22, H/H 10.3/30.9, PLT 159, AST 41, ALT 29, glucose 201. A1c 5.8%, normal TSH, FT4 01/29/20.   EKG: Last EKG is > 1 year ago.   CV: Echo 12/12/13: Study Conclusions  - Left ventricle: The cavity size was normal. Wall thickness was  increased in a pattern of mild LVH. Systolic function was normal.  The estimated ejection fraction was in the range of 60% to 65%.  Wall motion was normal; there were no regional wall motion  abnormalities. Doppler parameters are consistent with  pseudonormal left ventricular relaxation (grade 2 diastolic  dysfunction). The E/e&' ratio is >15, suggesting elevated LV  Filling pressure.  - Aortic valve: Trileaflet. Sclerosis without stenosis. There was  trace to mild regurgitation.  - Mitral valve: Mildly thickened leaflets . There was mild  regurgitation.  - Left atrium: The atrium is moderately dilated at 45 ml/m2.  - Right ventricle: The cavity size was mildly dilated.  - Right atrium: The atrium was mildly dilated.  - Tricuspid valve: There was moderate regurgitation.  - Pulmonary arteries: PA peak pressure: 49 mm Hg (S).  Impressions:  - No significant change compared to the prior echo in 10/2013.    Past Medical History:  Diagnosis Date  . Anemia   . Atrial fibrillation ([place])   . Breast cancer ([place]) 10/2013   right upper outer  .  Cancer ([place])    right breast  . Complication of anesthesia    slow to wake up  . Diabetes mellitus without complication ([place])    type 2  . Dysrhythmia 10/15   AF  . Former smoker   . Full dentures   . Hyperlipidemia   . Hypertension   . Multinodular goiter   . Osteoporosis   . Radiation    Right Breast  . Thyroid disease    hypothyroidism  . Wears glasses     Past Surgical History:  Procedure Laterality  Date  . ABDOMINAL HYSTERECTOMY    . AXILLARY LYMPH NODE DISSECTION Right 01/09/2014   Procedure: RIGHT AXILLARY LYMPH NODE DISECTION;  Surgeon: [name], MD;  Location: [place];  Service: General;  Laterality: Right;  . BIOPSY  10/26/2019   Procedure: BIOPSY;  Surgeon: Rush Landmark Telford Nab., MD;  Location: [facility];  Service: Gastroenterology;;  . BREAST LUMPECTOMY WITH RADIOACTIVE SEED LOCALIZATION Right 01/09/2014   Procedure: RIGHT BREAST SEED LOCALIZED LUMPECTOMY ;  Surgeon: [name], MD;  Location: [place];  Service: General;  Laterality: Right;  . COLONOSCOPY WITH PROPOFOL N/A 10/26/2019   Procedure: COLONOSCOPY WITH PROPOFOL;  Surgeon: Rush Landmark Telford Nab., MD;  Location: [facility];  Service: Gastroenterology;  Laterality: N/A;  . ESOPHAGOGASTRODUODENOSCOPY (EGD) WITH PROPOFOL N/A 10/26/2019   Procedure: ESOPHAGOGASTRODUODENOSCOPY (EGD) WITH PROPOFOL;  Surgeon: Rush Landmark Telford Nab., MD;  [facility]: [facility];  Service: Gastroenterology;  Laterality: N/A;  . EYE SURGERY  12/22/2009   cataracts  . HEMOSTASIS CLIP PLACEMENT  10/26/2019   Procedure: HEMOSTASIS CLIP PLACEMENT;  Surgeon: Irving Copas., MD;  [facility]: [facility];  Service: Gastroenterology;;  marking for transverse lesion  . INTRAMEDULLARY (IM) NAIL INTERTROCHANTERIC Left 02/24/2014   Procedure: IM ROD LEFT HIP FX;  Surgeon: Alta Corning, MD;  Location: [place];  Service: Orthopedics;  Laterality: Left;  . left distal radius fracture     2020  . POLYPECTOMY  10/26/2019   Procedure: POLYPECTOMY;  Surgeon: Mansouraty, Telford Nab., MD;  Location: [facility];  Service: Gastroenterology;;  . PORT-A-CATH REMOVAL Right 01/09/2014   Procedure: REMOVAL PORT-A-CATH;  Surgeon: [name], MD;  Location: [place];  Service: General;  Laterality: Right;  . PORTACATH PLACEMENT N/A 11/15/2013   Procedure: INSERTION PORT-A-CATH WITH ULTRA SOUND AND  FLOROSCOPY;  Surgeon: [name], MD;  Location: [place];  Service: General;  Laterality: N/A;  . SUBMUCOSAL TATTOO INJECTION  10/26/2019   Procedure: SUBMUCOSAL TATTOO INJECTION;  Surgeon: Irving Copas., MD;  [facility];  Service: Gastroenterology;;  . TOTAL KNEE ARTHROPLASTY  2002   left    MEDICATIONS: . denosumab (PROLIA) injection 60 mg   . docusate sodium (COLACE) 100 MG capsule  . doxazosin (CARDURA) 4 MG tablet  . ferrous sulfate 324 (65 Fe) MG TBEC  . sitaGLIPtin (JANUVIA) 100 MG tablet  . tamoxifen (NOLVADEX) 20 MG tablet  . amiodarone (PACERONE) 200 MG tablet  . apixaban (ELIQUIS) 2.5 MG TABS tablet  . atorvastatin (LIPITOR) 20 MG tablet  . Blood Glucose Monitoring Suppl (ONETOUCH VERIO) w/Device KIT  . cholecalciferol (VITAMIN D3) 25 MCG (1000 UNIT) tablet  . ferrous sulfate 324 (65 Fe) MG TBEC  . furosemide (LASIX) 40 MG tablet  . KLOR-CON M20 20 MEQ tablet  . meclizine (ANTIVERT) 12.5 MG tablet  . metoprolol succinate (TOPROL-XL) 100 MG 24 hr tablet  . Na Sulfate-K Sulfate-Mg Sulf (SUPREP BOWEL PREP KIT) 17.5-3.13-1.6 GM/177ML SOLN  .  ONETOUCH VERIO test strip  . polyethylene glycol (MIRALAX / GLYCOLAX) 17 g packet     Myra Gianotti, PA-C Surgical Short Stay/Anesthesiology [facility] Phone [phone] [facility] Phone [phone] 03/07/2020 2:18 PM

## 2020-03-10 NOTE — Progress Notes (Signed)
Pre-call completed for additional screening for day before procedure.  Spoke with pt granddaughter, [place].  The pt, Whitney [name], denies having signs/symptoms of Covid (fever, cold symptoms, body aches, etc.)  Pt was able to quarantine over the weekend, only seeing members of the family she lives with.  Pt scheduled to arrive at 8:30-9am for 10:00am procedure.

## 2020-03-11 ENCOUNTER — Ambulatory Visit (HOSPITAL_COMMUNITY): Payer: Medicare Other | Admitting: Vascular Surgery

## 2020-03-11 ENCOUNTER — Encounter (HOSPITAL_COMMUNITY)
Admission: RE | Disposition: A | Discharge status: Home / Self Care | Payer: Self-pay | Source: Home / Self Care | Attending: Gastroenterology

## 2020-03-11 ENCOUNTER — Encounter (HOSPITAL_COMMUNITY): Payer: Self-pay | Admitting: Gastroenterology

## 2020-03-11 ENCOUNTER — Ambulatory Visit (HOSPITAL_COMMUNITY)
Admission: RE | Admit: 2020-03-11 | Discharge: 2020-03-11 | Disposition: A | Discharge status: Home / Self Care | Payer: Medicare Other | Attending: Gastroenterology | Admitting: Gastroenterology

## 2020-03-11 DIAGNOSIS — K5790 Diverticulosis of intestine, part unspecified, without perforation or abscess without bleeding: Secondary | ICD-10-CM

## 2020-03-11 DIAGNOSIS — Z79899 Other long term (current) drug therapy: Secondary | ICD-10-CM | POA: Insufficient documentation

## 2020-03-11 DIAGNOSIS — D123 Benign neoplasm of transverse colon: Secondary | ICD-10-CM | POA: Diagnosis not present

## 2020-03-11 DIAGNOSIS — Z923 Personal history of irradiation: Secondary | ICD-10-CM | POA: Insufficient documentation

## 2020-03-11 DIAGNOSIS — K624 Stenosis of anus and rectum: Secondary | ICD-10-CM

## 2020-03-11 DIAGNOSIS — Z7901 Long term (current) use of anticoagulants: Secondary | ICD-10-CM | POA: Insufficient documentation

## 2020-03-11 DIAGNOSIS — K635 Polyp of colon: Secondary | ICD-10-CM

## 2020-03-11 DIAGNOSIS — K5730 Diverticulosis of large intestine without perforation or abscess without bleeding: Secondary | ICD-10-CM | POA: Diagnosis not present

## 2020-03-11 DIAGNOSIS — Z853 Personal history of malignant neoplasm of breast: Secondary | ICD-10-CM | POA: Insufficient documentation

## 2020-03-11 DIAGNOSIS — K529 Noninfective gastroenteritis and colitis, unspecified: Secondary | ICD-10-CM | POA: Diagnosis not present

## 2020-03-11 DIAGNOSIS — K642 Third degree hemorrhoids: Secondary | ICD-10-CM | POA: Diagnosis not present

## 2020-03-11 DIAGNOSIS — Z86010 Personal history of colonic polyps: Secondary | ICD-10-CM

## 2020-03-11 DIAGNOSIS — Z7984 Long term (current) use of oral hypoglycemic drugs: Secondary | ICD-10-CM | POA: Insufficient documentation

## 2020-03-11 DIAGNOSIS — K6389 Other specified diseases of intestine: Secondary | ICD-10-CM | POA: Diagnosis not present

## 2020-03-11 DIAGNOSIS — I10 Essential (primary) hypertension: Secondary | ICD-10-CM

## 2020-03-11 DIAGNOSIS — Z87891 Personal history of nicotine dependence: Secondary | ICD-10-CM | POA: Insufficient documentation

## 2020-03-11 DIAGNOSIS — I5032 Chronic diastolic (congestive) heart failure: Secondary | ICD-10-CM

## 2020-03-11 DIAGNOSIS — Z888 Allergy status to other drugs, medicaments and biological substances status: Secondary | ICD-10-CM | POA: Insufficient documentation

## 2020-03-11 DIAGNOSIS — K644 Residual hemorrhoidal skin tags: Secondary | ICD-10-CM | POA: Insufficient documentation

## 2020-03-11 DIAGNOSIS — Z9221 Personal history of antineoplastic chemotherapy: Secondary | ICD-10-CM | POA: Diagnosis not present

## 2020-03-11 DIAGNOSIS — K56609 Unspecified intestinal obstruction, unspecified as to partial versus complete obstruction: Secondary | ICD-10-CM | POA: Insufficient documentation

## 2020-03-11 DIAGNOSIS — Z09 Encounter for follow-up examination after completed treatment for conditions other than malignant neoplasm: Secondary | ICD-10-CM | POA: Insufficient documentation

## 2020-03-11 DIAGNOSIS — K56699 Other intestinal obstruction unspecified as to partial versus complete obstruction: Secondary | ICD-10-CM | POA: Diagnosis not present

## 2020-03-11 DIAGNOSIS — I272 Pulmonary hypertension, unspecified: Secondary | ICD-10-CM

## 2020-03-11 HISTORY — PX: POLYPECTOMY: SHX5525

## 2020-03-11 HISTORY — PX: ENDOSCOPIC MUCOSAL RESECTION: SHX6839

## 2020-03-11 HISTORY — PX: BIOPSY: SHX5522

## 2020-03-11 HISTORY — PX: COLONOSCOPY WITH PROPOFOL: SHX5780

## 2020-03-11 LAB — GLUCOSE, CAPILLARY: Glucose-Capillary: 154 mg/dL — ABNORMAL HIGH (ref 70–99)

## 2020-03-11 SURGERY — COLONOSCOPY WITH PROPOFOL
Anesthesia: Monitor Anesthesia Care

## 2020-03-11 MED ORDER — PROPOFOL 500 MG/50ML IV EMUL
INTRAVENOUS | Status: DC | PRN
  Administered 2020-03-11: 100 ug/kg/min via INTRAVENOUS

## 2020-03-11 MED ORDER — SODIUM CHLORIDE 0.9 % IV SOLN: INTRAVENOUS | Status: DC

## 2020-03-11 MED ORDER — LACTATED RINGERS IV SOLN: INTRAVENOUS | Status: DC | PRN

## 2020-03-11 MED ORDER — EPHEDRINE SULFATE 50 MG/ML IJ SOLN
INTRAMUSCULAR | Status: DC | PRN
  Administered 2020-03-11 (×2): 5 mg via INTRAVENOUS

## 2020-03-11 MED ORDER — APIXABAN 2.5 MG PO TABS: 2.5000 mg | ORAL_TABLET | Freq: Two times a day (BID) | ORAL | 3 refills | Status: DC

## 2020-03-11 MED ORDER — PHENYLEPHRINE HCL (PRESSORS) 10 MG/ML IV SOLN
INTRAVENOUS | Status: DC | PRN
  Administered 2020-03-11 (×4): 80 ug via INTRAVENOUS

## 2020-03-11 MED ORDER — PHENYLEPHRINE HCL-NACL 10-0.9 MG/250ML-% IV SOLN
INTRAVENOUS | Status: DC | PRN
  Administered 2020-03-11: 40 ug/min via INTRAVENOUS

## 2020-03-11 SURGICAL SUPPLY — 22 items

## 2020-03-11 NOTE — Discharge Instructions (Signed)

## 2020-03-11 NOTE — Anesthesia Postprocedure Evaluation (Signed)
Anesthesia Post Note  Patient: Whitney [name]  Procedure(s) Performed: COLONOSCOPY WITH PROPOFOL- Ultraslim scope (N/A ) ENDOSCOPIC MUCOSAL RESECTION (N/A ) POLYPECTOMY     Patient location during evaluation: Endoscopy Anesthesia Type: MAC Level of consciousness: awake and alert, patient cooperative and oriented Pain management: pain level controlled Vital Signs Assessment: post-procedure vital signs reviewed and stable Respiratory status: spontaneous breathing, nonlabored ventilation and respiratory function stable Cardiovascular status: stable and blood pressure returned to baseline Postop Assessment: no apparent nausea or vomiting Anesthetic complications: no   No complications documented.  Last Vitals:  Vitals:   03/11/20 1240 03/11/20 1250  BP: 135/80 124/73  Pulse: 74 78  Resp: (!) 22 14  Temp:    SpO2: 94% 93%    Last Pain:  Vitals:   03/11/20 1240  TempSrc:   PainSc: 0-No pain                 Keasha Malkiewicz,E. Aalijah Mims

## 2020-03-11 NOTE — Transfer of Care (Signed)
Immediate Anesthesia Transfer of Care Note  Patient: Whitney [name]  Procedure(s) Performed: COLONOSCOPY WITH PROPOFOL- Ultraslim scope (N/A ) ENDOSCOPIC MUCOSAL RESECTION (N/A ) POLYPECTOMY  Patient Location: Endoscopy Unit  Anesthesia Type:MAC  Level of Consciousness: awake, drowsy and patient cooperative  Airway & Oxygen Therapy: Patient Spontanous Breathing and Patient connected to face mask oxygen  Post-op Assessment: Report given to RN, Post -op Vital signs reviewed and stable and Patient moving all extremities X 4  Post vital signs: Reviewed and stable  Last Vitals:  Vitals Value Taken Time  BP    Temp    Pulse    Resp    SpO2      Last Pain:  Vitals:   03/11/20 0956  TempSrc: Oral  PainSc: 0-No pain         Complications: No complications documented.

## 2020-03-11 NOTE — H&P (Addendum)
GASTROENTEROLOGY PROCEDURE H&P NOTE   Primary Care Physician: Donita Brooks, MD  HPI: Whitney [name] is a 86 y.o. female who presents for Colonoscopy with attempt at EMR of large TC polyp with HGD.  Past Medical History:  Diagnosis Date  . Anemia   . Atrial fibrillation ([facility])   . Breast cancer ([facility]) 10/2013   right upper outer  . Cancer ([facility])    right breast  . Complication of anesthesia    slow to wake up  . Diabetes mellitus without complication ([facility])    type 2  . Dysrhythmia 10/15   AF  . Former smoker   . Full dentures   . Hyperlipidemia   . Hypertension   . Multinodular goiter   . Osteoporosis   . Radiation    Right Breast  . Thyroid disease    hypothyroidism  . Wears glasses    Past Surgical History:  Procedure Laterality Date  . ABDOMINAL HYSTERECTOMY    . AXILLARY LYMPH NODE DISSECTION Right 01/09/2014   Procedure: RIGHT AXILLARY LYMPH NODE DISECTION;  Surgeon: Harriette Bouillon, MD;  Location: [facility];  Service: General;  Laterality: Right;  . BIOPSY  10/26/2019   Procedure: BIOPSY;  Surgeon: Meridee Score Netty Starring., MD;  Location: [facility];  Service: Gastroenterology;;  . BREAST LUMPECTOMY WITH RADIOACTIVE SEED LOCALIZATION Right 01/09/2014   Procedure: RIGHT BREAST SEED LOCALIZED LUMPECTOMY ;  Surgeon: Harriette Bouillon, MD;  Location: [facility];  Service: General;  Laterality: Right;  . COLONOSCOPY WITH PROPOFOL N/A 10/26/2019   Procedure: COLONOSCOPY WITH PROPOFOL;  Surgeon: Meridee Score Netty Starring., MD;  Location: [facility];  Service: Gastroenterology;  Laterality: N/A;  . ESOPHAGOGASTRODUODENOSCOPY (EGD) WITH PROPOFOL N/A 10/26/2019   Procedure: ESOPHAGOGASTRODUODENOSCOPY (EGD) WITH PROPOFOL;  Surgeon: Meridee Score Netty Starring., MD;  [facility];  Service: Gastroenterology;  Laterality: N/A;  . EYE SURGERY  12/22/2009   cataracts  . HEMOSTASIS CLIP PLACEMENT  10/26/2019   Procedure: HEMOSTASIS CLIP  PLACEMENT;  Surgeon: Lemar Lofty., MD;  [facility];  Service: Gastroenterology;;  marking for transverse lesion  . INTRAMEDULLARY (IM) NAIL INTERTROCHANTERIC Left 02/24/2014   Procedure: IM ROD LEFT HIP FX;  Surgeon: Harvie Junior, MD;  [facility];  Service: Orthopedics;  Laterality: Left;  . left distal radius fracture     2020  . POLYPECTOMY  10/26/2019   Procedure: POLYPECTOMY;  Surgeon: Mansouraty, Netty Starring., MD;  Location: [facility];  Service: Gastroenterology;;  . PORT-A-CATH REMOVAL Right 01/09/2014   Procedure: REMOVAL PORT-A-CATH;  Surgeon: Harriette Bouillon, MD;  Location: [facility];  Service: General;  Laterality: Right;  . PORTACATH PLACEMENT N/A 11/15/2013   Procedure: INSERTION PORT-A-CATH WITH ULTRA SOUND AND FLOROSCOPY;  Surgeon: Harriette Bouillon, MD;  [facility];  Service: General;  Laterality: N/A;  . SUBMUCOSAL TATTOO INJECTION  10/26/2019   Procedure: SUBMUCOSAL TATTOO INJECTION;  Surgeon: Lemar Lofty., MD;  [facility];  Service: Gastroenterology;;  . TOTAL KNEE ARTHROPLASTY  2002   left   No current facility-administered medications for this encounter.   Allergies  Allergen Reactions  . Tape Other (See Comments)    Skin is somewhat sensitive   Family History  Problem Relation Age of Onset  . Colon cancer Neg Hx   . Esophageal cancer Neg Hx   . Inflammatory bowel disease Neg Hx   . Liver disease Neg Hx   . Pancreatic cancer Neg Hx   . Rectal cancer Neg  Hx   . Stomach cancer Neg Hx    Social History   Socioeconomic History  . Marital status: Widowed    Spouse name: Not on file  . Number of children: 4  . Years of education: Not on file  . Highest education level: Not on file  Occupational History  . Not on file  Tobacco Use  . Smoking status: Former Smoker    Packs/day: 1.00    Years: 5.00    Pack years: 5.00    Quit date: 11/14/1958    Years since quitting: 61.3  . Smokeless  tobacco: Never Used  Vaping Use  . Vaping Use: Never used  Substance and Sexual Activity  . Alcohol use: No    Alcohol/week: 0.0 standard drinks  . Drug use: No  . Sexual activity: Not Currently    Birth control/protection: Post-menopausal    Comment: menarche age 11, P48, first birth age 63, no HRT, menopause age 78  Other Topics Concern  . Not on file  Social History Narrative  . Not on file   Social Determinants of Health   Financial Resource Strain: Low Risk   . Difficulty of Paying Living Expenses: Not very hard  Food Insecurity: Not on file  Transportation Needs: Not on file  Physical Activity: Not on file  Stress: Not on file  Social Connections: Not on file  Intimate Partner Violence: Not on file    Physical Exam: Vital signs in last 24 hours:     GEN: NAD EYE: Sclerae anicteric ENT: MMM CV: Non-tachycardic GI: Soft, NT/ND NEURO:  Alert & Oriented x 3  Lab Results: No results for input(s): WBC, HGB, HCT, PLT in the last 72 hours. BMET No results for input(s): NA, K, CL, CO2, GLUCOSE, BUN, CREATININE, CALCIUM in the last 72 hours. LFT No results for input(s): PROT, ALBUMIN, AST, ALT, ALKPHOS, BILITOT, BILIDIR, IBILI in the last 72 hours. PT/INR No results for input(s): LABPROT, INR in the last 72 hours.   Impression / Plan: [name] who presents for Colonoscopy with attempt at EMR of large TC polyp with HGD.  The risks and benefits of endoscopic evaluation were discussed with the patient; these include but are not limited to the risk of perforation, infection, bleeding, missed lesions, lack of diagnosis, severe illness requiring hospitalization, as well as anesthesia and sedation related illnesses.  The patient and granddaughter are agreeable to proceed.    Justice Britain, MD [facility] Office # [phone]

## 2020-03-11 NOTE — Op Note (Signed)
[facility] Patient Name: Whitney [name] Procedure Date : 03/11/2020 MRN: [id number] Attending MD: Justice Britain , MD Date of Birth: 08/28/1927 CSN: [id number] Age: 86 Admit Type: Outpatient Procedure:                Colonoscopy Indications:              Excision of colonic polyp Providers:                Justice Britain, MD, Baird Cancer, RN, Ladona Ridgel, Technician Referring MD:             Cammie Mcgee. Tobie Lords M. Pyrtle, MD Medicines:                Monitored Anesthesia Care Complications:            No immediate complications. Estimated Blood Loss:     Estimated blood loss was minimal. Procedure:                Pre-Anesthesia Assessment:                           - Prior to the procedure, a History and Physical                            was performed, and patient medications and                            allergies were reviewed. The patient's tolerance of                            previous anesthesia was also reviewed. The risks                            and benefits of the procedure and the sedation                            options and risks were discussed with the patient.                            All questions were answered, and informed consent                            was obtained. Prior Anticoagulants: The patient has                            taken Eliquis (apixaban), last dose was 3 days                            prior to procedure. ASA Grade Assessment: III - A                            patient with severe systemic disease. After  reviewing the risks and benefits, the patient was                            deemed in satisfactory condition to undergo the                            procedure.                           The only scope that will pass into this region is                            an ultra-slim colonoscope.                           After obtaining informed consent,  the ultra-slim                            colonoscope was passed under direct vision.                            Throughout the procedure, the patient's blood                            pressure, pulse, and oxygen saturations were                            monitored continuously. The PCF-PH190L ([id number])                            Olympus ultra slim colonoscope was introduced                            through the anus and advanced to the the cecum,                            identified by appendiceal orifice and ileocecal                            valve. The colonoscopy was technically difficult                            and complex due to bowel stenosis and restricted                            mobility of the colon. Successful completion of the                            procedure was aided by changing the patient's                            position, using manual pressure, withdrawing and                            reinserting  the scope, straightening and shortening                            the scope to obtain bowel loop reduction and using                            scope torsion. The patient tolerated the procedure.                            The quality of the bowel preparation was adequate.                            The ileocecal valve, appendiceal orifice, and                            rectum were photographed. Scope In: 10:31:01 AM Scope Out: 12:10:47 PM Scope Withdrawal Time: 1 hour 28 minutes 14 seconds  Total Procedure Duration: 1 hour 39 minutes 46 seconds  Findings:      The digital rectal exam findings include hemorrhoids. Pertinent       negatives include no palpable rectal lesions.      A moderate amount of liquid semi-liquid stool was found in the entire       colon, interfering with visualization. Lavage of the area was performed       using copious amounts, resulting in clearance with adequate       visualization.      A 8 mm polyp was found in the  transverse colon. The polyp was sessile.       The polyp was removed with a cold snare. Resection and retrieval were       complete.      A 40 mm polypoid lesion was found in the proximal transverse colon.       Tattoo was found on the contralateral wall of the lesion. The lesion was       semi-sessile. Previous biopsies showed evidence of High Grade Dysplasia       without Malignancy. There remained areas of JNET 2a and JNET 2b in       majority of lesion. One area in the most proximal aspect has JNET3 loss       of pit pattern. Preparations were made for an attempt at mucosal       resection. NBI imaging and White-light endoscopy was done to demarcate       the borders of the lesion. Orise gel was injected to raise the lesion       and the vast majority of the lesion, including region of JNET3       visualization did lift. Piecemeal mucosal resection using a snare was       performed. Resection and retrieval were complete. In effort of trying to       prevent bleeding after mucosal resection, six hemostatic clips were       successfully placed (MR conditional). There was no bleeding at the end       of the procedure.      An area of moderately congested and erythematous mucosa was found in the       sigmoid colon. This was biopsied with a cold forceps for histology to       rule out  chronic colitis.      Immediately thereafter, a benign-appearing, intrinsic moderate stenosis       measuring 6 cm (in length) x 1 cm (inner diameter) was found in the       recto-sigmoid colon and was traversed with normal appearing tissue other       than having diverticula in the region.      Proximal to the above noted narrowing, the lumen of the descending colon       and splenic flexure was mildly dilated and this was able to be suctioned       via endoscope - but suggestive of chronic issues of stool passage.      Many small and large-mouthed diverticula were found in the rectum,       recto-sigmoid  colon, sigmoid colon and descending colon.      Non-bleeding non-thrombosed external and internal hemorrhoids were found       during retroflexion, during perianal exam and during digital exam. The       hemorrhoids were Grade III (internal hemorrhoids that prolapse but       require manual reduction). Impression:               - Hemorrhoids found on digital rectal exam.                           - Stool in the entire examined colon - lavaged                            copiously with adequate visualization.                           - One 8 mm polyp in the transverse colon, removed                            with a cold snare. Resected and retrieved.                           - One 40 mm polypoid lesion (previously biopsied as                            only HGD) with JNET2a/JNET2b and some JNET3                            visualization was noted in the proximal transverse                            colon (contralateral to previously placed tattoo),                            removed in piecemeal mucosal resection fashion                            after lift performed of majority of the lesion.                            Resected and retrieved. Clips (MR conditional) were  placed to try to close defect and decrease risk of                            bleeding.                           - Congested mucosa in the sigmoid colon. Biopsied                            to rule out chronic colitis.                           - Narrowing/stenosis in the recto-sigmoid colon                            with proximal dilation of the descending colon and                            at the splenic flexure.                           - Diverticulosis in the rectum, in the                            recto-sigmoid colon, in the sigmoid colon and in                            the descending colon.                           - Non-bleeding non-thrombosed external and internal                             hemorrhoids. Recommendation:           - The patient will be observed post-procedure,                            until all discharge criteria are met.                           - Discharge patient to home.                           - Patient has a contact number available for                            emergencies. The signs and symptoms of potential                            delayed complications were discussed with the                            patient. Return to normal activities tomorrow.  Written discharge instructions were provided to the                            patient.                           - Low residue diet.                           - May restart Eliquis in 72 hours (12/30 PM) to                            decrease risk of post-interventional bleeding risk.                           - No ibuprofen, naproxen, or other non-steroidal                            anti-inflammatory drugs for 3 weeks.                           - Continue present medications.                           - Await pathology results.                           - Repeat colonoscopy in 6-9 months for surveillance                            based on pathology results if no evidence of                            malignancy is found and if patient/family feel that                            they would want to continue surveillance (she will                            be 89 at that time so will need to discuss if this                            is the case).                           - If evidence of perisistent colitis is present on                            biopsies, query if there is a role for mesalamine                            if this is SCAD vs other chronic colitis that could  be of some benefit for patient in future.                           - The findings and recommendations were discussed                            with the  patient.                           - The findings and recommendations were discussed                            with the patient's family. Procedure Code(s):        --- Professional ---                           [phone], Colonoscopy, flexible; with endoscopic                            mucosal resection                           45385, 91, Colonoscopy, flexible; with removal of                            tumor(s), polyp(s), or other lesion(s) by snare                            technique                           45380, 10, Colonoscopy, flexible; with biopsy,                            single or multiple Diagnosis Code(s):        --- Professional ---                           K64.2, Third degree hemorrhoids                           K63.5, Polyp of colon                           K63.89, Other specified diseases of intestine                           K56.699, Other intestinal obstruction unspecified                            as to partial versus complete obstruction CPT copyright 2019 [employer]. All rights reserved. The codes documented in this report are preliminary and upon coder review may  be revised to meet current compliance requirements. Justice Britain, MD 03/11/2020 12:37:08 PM Number of Addenda: 0

## 2020-03-12 ENCOUNTER — Encounter (HOSPITAL_COMMUNITY): Payer: Self-pay | Admitting: Gastroenterology

## 2020-03-13 LAB — SURGICAL PATHOLOGY

## 2020-03-17 ENCOUNTER — Encounter: Payer: Self-pay | Admitting: Gastroenterology

## 2020-03-18 ENCOUNTER — Other Ambulatory Visit: Payer: Self-pay

## 2020-03-18 DIAGNOSIS — D369 Benign neoplasm, unspecified site: Secondary | ICD-10-CM

## 2020-03-19 ENCOUNTER — Other Ambulatory Visit: Payer: Medicare Other

## 2020-03-19 DIAGNOSIS — D369 Benign neoplasm, unspecified site: Secondary | ICD-10-CM

## 2020-03-19 LAB — CEA: CEA: 2 ng/mL

## 2020-03-22 ENCOUNTER — Other Ambulatory Visit: Payer: Self-pay | Admitting: Hematology and Oncology

## 2020-03-22 DIAGNOSIS — C50411 Malignant neoplasm of upper-outer quadrant of right female breast: Secondary | ICD-10-CM

## 2020-03-25 ENCOUNTER — Institutional Professional Consult (permissible substitution): Payer: Medicare Other | Admitting: Pulmonary Disease

## 2020-03-25 ENCOUNTER — Ambulatory Visit: Payer: Self-pay | Admitting: Family Medicine

## 2020-03-25 ENCOUNTER — Ambulatory Visit (HOSPITAL_COMMUNITY): Admission: RE | Admit: 2020-03-25 | Payer: Medicare Other | Source: Ambulatory Visit

## 2020-03-25 NOTE — Progress Notes (Deleted)
Synopsis: Referred in January 2022 for pleural effusion by Mansouraty, Telford Nab.*  Subjective:   PATIENT ID: Whitney [name] GENDER: female DOB: 10-30-1927, MRN: [id number]  No chief complaint on file.   A 87 year old female, past medical history of atrial fibrillation, breast cancer, type 2 diabetes, former smoker, hypertension hyperlipidemia. Patient was referred for evaluation of pleural effusion. In October 2021 patient had a CT abdomen and pelvis which revealed a moderate right-sided pleural effusion. Patient had a CT scan of the chest ordered which is scheduled for today. Was recently seen by [name] for a colonoscopy. During this procedure patient had a 40 mm polypoid lesion in the proximal transverse colon. Which was resected endoscopically.   ***  Past Medical History:  Diagnosis Date  . Anemia   . Atrial fibrillation ([place])   . Breast cancer ([place]) 10/2013   right upper outer  . Cancer ([place])    right breast  . Complication of anesthesia    slow to wake up  . Diabetes mellitus without complication ([place])    type 2  . Dysrhythmia 10/15   AF  . Former smoker   . Full dentures   . Hyperlipidemia   . Hypertension   . Multinodular goiter   . Osteoporosis   . Radiation    Right Breast  . Thyroid disease    hypothyroidism  . Wears glasses      Family History  Problem Relation Age of Onset  . Colon cancer Neg Hx   . Esophageal cancer Neg Hx   . Inflammatory bowel disease Neg Hx   . Liver disease Neg Hx   . Pancreatic cancer Neg Hx   . Rectal cancer Neg Hx   . Stomach cancer Neg Hx      Past Surgical History:  Procedure Laterality Date  . ABDOMINAL HYSTERECTOMY    . AXILLARY LYMPH NODE DISSECTION Right 01/09/2014   Procedure: RIGHT AXILLARY LYMPH NODE DISECTION;  Surgeon: [name], MD;  Location: [facility];  Service: General;  Laterality: Right;  . BIOPSY  10/26/2019   Procedure: BIOPSY;  Surgeon: Rush Landmark Telford Nab., MD;   Location: [facility];  Service: Gastroenterology;;  . BIOPSY  03/11/2020   Procedure: BIOPSY;  Surgeon: Irving Copas., MD;  Location: [place] [facility];  Service: Gastroenterology;;  . BREAST LUMPECTOMY WITH RADIOACTIVE SEED LOCALIZATION Right 01/09/2014   Procedure: RIGHT BREAST SEED LOCALIZED LUMPECTOMY ;  Surgeon: [name], MD;  Location: [place];  Service: General;  Laterality: Right;  . COLONOSCOPY WITH PROPOFOL N/A 10/26/2019   Procedure: COLONOSCOPY WITH PROPOFOL;  Surgeon: Rush Landmark Telford Nab., MD;  Location: [facility];  Service: Gastroenterology;  Laterality: N/A;  . COLONOSCOPY WITH PROPOFOL N/A 03/11/2020   Procedure: COLONOSCOPY WITH PROPOFOL- Ultraslim scope;  Surgeon: Irving Copas., MD;  Location: [place];  Service: Gastroenterology;  Laterality: N/A;  . ENDOSCOPIC MUCOSAL RESECTION N/A 03/11/2020   Procedure: ENDOSCOPIC MUCOSAL RESECTION;  Surgeon: Rush Landmark Telford Nab., MD;  Location: [place];  Service: Gastroenterology;  Laterality: N/A;  . ESOPHAGOGASTRODUODENOSCOPY (EGD) WITH PROPOFOL N/A 10/26/2019   Procedure: ESOPHAGOGASTRODUODENOSCOPY (EGD) WITH PROPOFOL;  Surgeon: Rush Landmark Telford Nab., MD;  Location: [facility];  Service: Gastroenterology;  Laterality: N/A;  . EYE SURGERY  12/22/2009   cataracts  . HEMOSTASIS CLIP PLACEMENT  10/26/2019   Procedure: HEMOSTASIS CLIP PLACEMENT;  Surgeon: Irving Copas., MD;  Location: [facility];  Service: Gastroenterology;;  marking for transverse lesion  . INTRAMEDULLARY (IM) NAIL INTERTROCHANTERIC Left  02/24/2014   Procedure: IM ROD LEFT HIP FX;  Surgeon: Alta Corning, MD;  Location: [place];  Service: Orthopedics;  Laterality: Left;  . left distal radius fracture     2020  . POLYPECTOMY  10/26/2019   Procedure: POLYPECTOMY;  Surgeon: Mansouraty, Telford Nab., MD;  Location: [facility];  Service: Gastroenterology;;  . POLYPECTOMY  03/11/2020   Procedure:  POLYPECTOMY;  Surgeon: Irving Copas., MD;  Location: [place];  Service: Gastroenterology;;  . PORT-A-CATH REMOVAL Right 01/09/2014   Procedure: REMOVAL PORT-A-CATH;  Surgeon: [name], MD;  Location: [facility];  Service: General;  Laterality: Right;  . PORTACATH PLACEMENT N/A 11/15/2013   Procedure: INSERTION PORT-A-CATH WITH ULTRA SOUND AND FLOROSCOPY;  Surgeon: [name], MD;  Location: [place];  Service: General;  Laterality: N/A;  . SUBMUCOSAL TATTOO INJECTION  10/26/2019   Procedure: SUBMUCOSAL TATTOO INJECTION;  Surgeon: Irving Copas., MD;  Location: [facility];  Service: Gastroenterology;;  . TOTAL KNEE ARTHROPLASTY  2002   left    Social History   Socioeconomic History  . Marital status: Widowed    Spouse name: Not on file  . Number of children: 4  . Years of education: Not on file  . Highest education level: Not on file  Occupational History  . Not on file  Tobacco Use  . Smoking status: Former Smoker    Packs/day: 1.00    Years: 5.00    Pack years: 5.00    Quit date: 11/14/1958    Years since quitting: 61.4  . Smokeless tobacco: Never Used  Vaping Use  . Vaping Use: Never used  Substance and Sexual Activity  . Alcohol use: No    Alcohol/week: 0.0 standard drinks  . Drug use: No  . Sexual activity: Not Currently    Birth control/protection: Post-menopausal    Comment: menarche age 42, P6, first birth age 29, no HRT, menopause age 15  Other Topics Concern  . Not on file  Social History Narrative  . Not on file   Social Determinants of Health   Financial Resource Strain: Low Risk   . Difficulty of Paying Living Expenses: Not very hard  Food Insecurity: Not on file  Transportation Needs: Not on file  Physical Activity: Not on file  Stress: Not on file  Social Connections: Not on file  Intimate Partner Violence: Not on file     Allergies  Allergen Reactions  . Tape Other (See Comments)    Skin is somewhat  sensitive     Outpatient Medications Prior to Visit  Medication Sig Dispense Refill  . amiodarone (PACERONE) 200 MG tablet Take 1 tablet (200 mg total) by mouth daily. 90 tablet 3  . apixaban (ELIQUIS) 2.5 MG TABS tablet Take 1 tablet (2.5 mg total) by mouth 2 (two) times daily. Provider has put Patient on 2.5 mg twice daily. 90 tablet 3  . atorvastatin (LIPITOR) 20 MG tablet TAKE 1 TABLET BY MOUTH EVERY DAY 90 tablet 3  . Blood Glucose Monitoring Suppl (ONETOUCH VERIO) w/Device KIT Use to test blood sugars daily. 1 kit 0  . cholecalciferol (VITAMIN D3) 25 MCG (1000 UNIT) tablet TAKE 1 TAB BY MOUTH EVERY DAY 100 tablet 1  . docusate sodium (COLACE) 100 MG capsule Take 100 mg by mouth daily.    Marland Kitchen doxazosin (CARDURA) 4 MG tablet Take 0.5 tablets (2 mg total) by mouth daily.    . ferrous sulfate 324 (65 Fe) MG TBEC TAKE 1 TABLET BY  MOUTH IN THE MORNING AND 1 AT BEDTIME (Patient taking differently: Take 324 mg by mouth daily. ) 180 tablet 1  . ferrous sulfate 324 (65 Fe) MG TBEC Take 1 tablet (324 mg total) by mouth once for 1 dose. 90 tablet 3  . furosemide (LASIX) 40 MG tablet Take 2 tablets (80 mg total) by mouth daily. 90 tablet 2  . KLOR-CON M20 20 MEQ tablet TAKE 1 TABLET BY MOUTH EVERY DAY 90 tablet 1  . meclizine (ANTIVERT) 12.5 MG tablet TAKE 1 TABLET BY MOUTH DAILY AS NEEDED FOR DIZZINESS 30 tablet 2  . metoprolol succinate (TOPROL-XL) 100 MG 24 hr tablet TAKE 1 TABLET BY MOUTH TWICE A DAY WITH OR IMMEDIATELY FOLLOWING A MEAL 180 tablet 1  . ONETOUCH VERIO test strip CHECK BLOOD SUGAR EVERY DAY AS DIRECTED 100 strip 2  . polyethylene glycol (MIRALAX / GLYCOLAX) 17 g packet Take 17 g by mouth daily. Also available OTC 30 each 0  . sitaGLIPtin (JANUVIA) 100 MG tablet Take 0.5 tablets (50 mg total) by mouth daily. Take 1/2 tablet (68m total) by mouth once daily. (Patient taking differently: Take 50 mg by mouth daily.) 90 tablet 1  . tamoxifen (NOLVADEX) 20 MG tablet Take 1 tablet (20 mg  total) by mouth daily. 90 tablet 3   Facility-Administered Medications Prior to Visit  Medication Dose Route Frequency Provider Last Rate Last Admin  . denosumab (PROLIA) injection 60 mg  60 mg Subcutaneous Q6 months PSusy Frizzle MD   60 mg at 10/05/19 0934    ROS   Objective:  Physical Exam   There were no vitals filed for this visit.   on *** LPM *** RA BMI Readings from Last 3 Encounters:  03/11/20 26.47 kg/m  01/29/20 26.47 kg/m  01/19/20 26.89 kg/m   Wt Readings from Last 3 Encounters:  03/11/20 164 lb 0.4 oz (74.4 kg)  01/29/20 164 lb (74.4 kg)  01/19/20 166 lb 9.6 oz (75.6 kg)     CBC    Component Value Date/Time   WBC 3.9 01/29/2020 0904   RBC 3.03 (L) 01/29/2020 0904   HGB 10.3 (L) 01/29/2020 0904   HGB 9.3 (L) 12/22/2013 1000   HCT 30.9 (L) 01/29/2020 0904   HCT 29.0 (L) 12/22/2013 1000   PLT 159 01/29/2020 0904   PLT 573 (H) 12/22/2013 1000   MCV 102.0 (H) 01/29/2020 0904   MCV 86.0 12/22/2013 1000   MCH 34.0 (H) 01/29/2020 0904   MCHC 33.3 01/29/2020 0904   RDW 12.8 01/29/2020 0904   RDW 16.9 (H) 12/22/2013 1000   LYMPHSABS 519 (L) 01/29/2020 0904   LYMPHSABS 0.5 (L) 12/22/2013 1000   MONOABS 0.4 10/24/2019 0508   MONOABS 1.4 (H) 12/22/2013 1000   EOSABS 20 01/29/2020 0904   EOSABS 0.0 12/22/2013 1000   BASOSABS 12 01/29/2020 0904   BASOSABS 0.0 12/22/2013 1000    Chest Imaging: ***  Pulmonary Functions Testing Results: No flowsheet data found.  FeNO: ***  Pathology: ***  Echocardiogram: ***  Heart Catheterization: ***    Assessment & Plan:   No diagnosis found.  Discussion: ***   Current Outpatient Medications:  .  amiodarone (PACERONE) 200 MG tablet, Take 1 tablet (200 mg total) by mouth daily., Disp: 90 tablet, Rfl: 3 .  apixaban (ELIQUIS) 2.5 MG TABS tablet, Take 1 tablet (2.5 mg total) by mouth 2 (two) times daily. Provider has put Patient on 2.5 mg twice daily., Disp: 90 tablet, Rfl: 3 .  atorvastatin (  LIPITOR)  20 MG tablet, TAKE 1 TABLET BY MOUTH EVERY DAY, Disp: 90 tablet, Rfl: 3 .  Blood Glucose Monitoring Suppl (ONETOUCH VERIO) w/Device KIT, Use to test blood sugars daily., Disp: 1 kit, Rfl: 0 .  cholecalciferol (VITAMIN D3) 25 MCG (1000 UNIT) tablet, TAKE 1 TAB BY MOUTH EVERY DAY, Disp: 100 tablet, Rfl: 1 .  docusate sodium (COLACE) 100 MG capsule, Take 100 mg by mouth daily., Disp: , Rfl:  .  doxazosin (CARDURA) 4 MG tablet, Take 0.5 tablets (2 mg total) by mouth daily., Disp: , Rfl:  .  ferrous sulfate 324 (65 Fe) MG TBEC, TAKE 1 TABLET BY MOUTH IN THE MORNING AND 1 AT BEDTIME (Patient taking differently: Take 324 mg by mouth daily. ), Disp: 180 tablet, Rfl: 1 .  ferrous sulfate 324 (65 Fe) MG TBEC, Take 1 tablet (324 mg total) by mouth once for 1 dose., Disp: 90 tablet, Rfl: 3 .  furosemide (LASIX) 40 MG tablet, Take 2 tablets (80 mg total) by mouth daily., Disp: 90 tablet, Rfl: 2 .  KLOR-CON M20 20 MEQ tablet, TAKE 1 TABLET BY MOUTH EVERY DAY, Disp: 90 tablet, Rfl: 1 .  meclizine (ANTIVERT) 12.5 MG tablet, TAKE 1 TABLET BY MOUTH DAILY AS NEEDED FOR DIZZINESS, Disp: 30 tablet, Rfl: 2 .  metoprolol succinate (TOPROL-XL) 100 MG 24 hr tablet, TAKE 1 TABLET BY MOUTH TWICE A DAY WITH OR IMMEDIATELY FOLLOWING A MEAL, Disp: 180 tablet, Rfl: 1 .  ONETOUCH VERIO test strip, CHECK BLOOD SUGAR EVERY DAY AS DIRECTED, Disp: 100 strip, Rfl: 2 .  polyethylene glycol (MIRALAX / GLYCOLAX) 17 g packet, Take 17 g by mouth daily. Also available OTC, Disp: 30 each, Rfl: 0 .  sitaGLIPtin (JANUVIA) 100 MG tablet, Take 0.5 tablets (50 mg total) by mouth daily. Take 1/2 tablet (49m total) by mouth once daily. (Patient taking differently: Take 50 mg by mouth daily.), Disp: 90 tablet, Rfl: 1 .  tamoxifen (NOLVADEX) 20 MG tablet, Take 1 tablet (20 mg total) by mouth daily., Disp: 90 tablet, Rfl: 3  Current Facility-Administered Medications:  .  denosumab (PROLIA) injection 60 mg, 60 mg, Subcutaneous, Q6 months, [place],  WCammie Mcgee MD, 60 mg at 10/05/19 00459 I spent *** minutes dedicated to the care of this patient on the date of this encounter to include pre-visit review of records, face-to-face time with the patient discussing conditions above, post visit ordering of testing, clinical documentation with the electronic health record, making appropriate referrals as documented, and communicating necessary findings to members of the patients care team.   BGarner Nash DO LFlorisPulmonary Critical Care 03/25/2020 7:47 AM

## 2020-03-26 ENCOUNTER — Encounter: Payer: Self-pay | Admitting: *Deleted

## 2020-03-27 DIAGNOSIS — M810 Age-related osteoporosis without current pathological fracture: Secondary | ICD-10-CM | POA: Diagnosis not present

## 2020-03-27 DIAGNOSIS — D649 Anemia, unspecified: Secondary | ICD-10-CM | POA: Diagnosis not present

## 2020-03-27 DIAGNOSIS — I509 Heart failure, unspecified: Secondary | ICD-10-CM | POA: Diagnosis not present

## 2020-03-29 ENCOUNTER — Telehealth: Payer: Self-pay | Admitting: Pharmacist

## 2020-03-29 NOTE — Progress Notes (Addendum)
Chronic Care Management Pharmacy Assistant   Name: Whitney [name]  MRN: [id number] DOB: 1927-05-16  Reason for Encounter: Disease State for DM  Patient Questions:  1.  Have you seen any other providers since your last visit? Yes.    2.  Any changes in your medicines or health? Yes.    PCP : [name], MD   Their chronic conditions include: HTN, CHF, Afib, Type II DM, Hypothyroidism,CKD, Hypercholesterolemia.  Office Visits:  01/29/20 Dr. Dennard Schaumann No medication changes.   Consults: 01/19/20 Endo Dr. Dwyane Dee for DM. No changes in her medication.   Hospital:  03/11/20 [name] for a colonoscopy. No medication changes.  Allergies:   Allergies  Allergen Reactions   Tape Other (See Comments)    Skin is somewhat sensitive    Medications: Outpatient Encounter Medications as of 03/29/2020  Medication Sig   amiodarone (PACERONE) 200 MG tablet Take 1 tablet (200 mg total) by mouth daily.   apixaban (ELIQUIS) 2.5 MG TABS tablet Take 1 tablet (2.5 mg total) by mouth 2 (two) times daily. Provider has put Patient on 2.5 mg twice daily.   atorvastatin (LIPITOR) 20 MG tablet TAKE 1 TABLET BY MOUTH EVERY DAY   Blood Glucose Monitoring Suppl (ONETOUCH VERIO) w/Device KIT Use to test blood sugars daily.   cholecalciferol (VITAMIN D3) 25 MCG (1000 UNIT) tablet TAKE 1 TAB BY MOUTH EVERY DAY   docusate sodium (COLACE) 100 MG capsule Take 100 mg by mouth daily.   doxazosin (CARDURA) 4 MG tablet Take 0.5 tablets (2 mg total) by mouth daily.   ferrous sulfate 324 (65 Fe) MG TBEC TAKE 1 TABLET BY MOUTH IN THE MORNING AND 1 AT BEDTIME (Patient taking differently: Take 324 mg by mouth daily. )   ferrous sulfate 324 (65 Fe) MG TBEC Take 1 tablet (324 mg total) by mouth once for 1 dose.   furosemide (LASIX) 40 MG tablet Take 2 tablets (80 mg total) by mouth daily.   KLOR-CON M20 20 MEQ tablet TAKE 1 TABLET BY MOUTH EVERY DAY   meclizine (ANTIVERT) 12.5 MG tablet TAKE 1 TABLET BY MOUTH  DAILY AS NEEDED FOR DIZZINESS   metoprolol succinate (TOPROL-XL) 100 MG 24 hr tablet TAKE 1 TABLET BY MOUTH TWICE A DAY WITH OR IMMEDIATELY FOLLOWING A MEAL   ONETOUCH VERIO test strip CHECK BLOOD SUGAR EVERY DAY AS DIRECTED   polyethylene glycol (MIRALAX / GLYCOLAX) 17 g packet Take 17 g by mouth daily. Also available OTC   sitaGLIPtin (JANUVIA) 100 MG tablet Take 0.5 tablets (50 mg total) by mouth daily. Take 1/2 tablet (87m total) by mouth once daily. (Patient taking differently: Take 50 mg by mouth daily.)   tamoxifen (NOLVADEX) 20 MG tablet TAKE 1 TABLET BY MOUTH EVERY DAY   Facility-Administered Encounter Medications as of 03/29/2020  Medication   denosumab (PROLIA) injection 60 mg    Current Diagnosis: Patient Active Problem List   Diagnosis Date Noted   History of colon polyps 12/01/2019   Colitis, acute 12/01/2019   Diverticulosis 12/01/2019   Stenosis colon ([facility] 12/01/2019   Abnormal colonoscopy 12/01/2019   Anemia 10/25/2019   Bright red blood per rectum 10/24/2019   Chronic kidney disease, stage 3a ([facility] 10/24/2019   Coagulopathy ([facility]    Osteoporosis    Unilateral primary osteoarthritis, right knee 03/25/2018   Chronic pain of right knee 05/25/2017   Bacteremia 10/25/2016   Transaminitis 10/23/2016   Sinus bradycardia 10/23/2016   Controlled diabetes mellitus type 2  with complications ([place])    Paroxysmal atrial fibrillation (HCC)    Chronic diastolic CHF (congestive heart failure) ([place])    Pulmonary hypertension (HCC)    Acute renal failure superimposed on stage 2 chronic kidney disease (HCC)    Elevated liver enzymes    Bacteremia due to Escherichia coli 12/30/2015   UTI (urinary tract infection) 12/30/2015   Acute blood loss anemia 02/25/2014   Closed left hip fracture ([place]) 02/24/2014   PAF (paroxysmal atrial fibrillation) ([place]) 01/30/2014   Anticoagulated 01/30/2014   Chronic diastolic CHF (congestive heart failure), NYHA class 2 ([place]) 12/10/2013    Preoperative cardiovascular examination 12/10/2013   Neutropenic fever ([place]) 12/08/2013   Anemia of chronic disease 12/08/2013   Thrombocytopenia ([place]) 12/08/2013   Hypokalemia 12/08/2013   Pancytopenia due to antineoplastic chemotherapy ([place]) 12/08/2013   Hypothyroidism 12/08/2013   Breast cancer of upper-outer quadrant of right female breast ([place]) 11/01/2013   Essential hypertension, benign 12/01/2012   Pure hypercholesterolemia 12/01/2012   DM2 (diabetes mellitus, type 2) ([place]) 11/21/2012    Goals Addressed   None    Recent Relevant Labs: Lab Results  Component Value Date/Time   HGBA1C 5.8 (A) 01/19/2020 10:47 AM   HGBA1C 6.1 (A) 07/19/2019 09:15 AM   HGBA1C 6.4 (H) 04/18/2018 08:48 AM   HGBA1C 6.1 (H) 10/25/2016 04:43 AM   MICROALBUR 6.1 04/18/2018 08:48 AM   MICROALBUR 5.3 (H) 06/29/2017 09:16 AM    Kidney Function Lab Results  Component Value Date/Time   CREATININE 1.22 (H) 01/29/2020 09:04 AM   CREATININE 1.40 (H) 01/12/2020 04:20 PM   CREATININE 1.60 (H) 11/17/2019 09:12 AM   CREATININE 1.3 (H) 12/22/2013 10:00 AM   CREATININE 1.0 12/05/2013 02:25 PM   GFR 39.53 (L) 07/19/2019 12:34 PM   GFRNONAA 39 (L) 01/29/2020 09:04 AM   GFRAA 45 (L) 01/29/2020 09:04 AM    Current antihyperglycemic regimen:  Januvia 100 mg one-half tablet daily  What recent interventions/DTPs have been made to improve glycemic control:  None.  Have there been any recent hospitalizations or ED visits since last visit with CPP? Yes, on 03/11/20 for Colonoscopy.  Patient reports hypoglycemic symptoms, including None   Patient reports hyperglycemic symptoms, including weakness   How often are you checking your blood sugar? Patient stated she checks her sugar once daily   What are your blood sugars ranging? No. Fasting: N/A   Before meals: N/A After meals: N/A Bedtime: N/A  During the week, how often does your blood glucose drop below 70? Never   Are you checking your feet  daily/regularly?   Patient stated she checks feet daily.     Adherence Review: Is the patient currently on a STATIN medication? Yes, Atorvastatin 20 mg   Is the patient currently on ACE/ARB medication? No.  Does the patient have >5 day gap between last estimated fill dates? No, CPP Please Check.  Patient stated both of her feet swell. The left is more swollen than the right one. She handed the phone to a young lady that confirmed everything Mrs.Kulikowski told me. She stated her feet swells sometimes because Mrs.Ramires does not keep them elevated like she should but they are trying to make sure she does.    Follow-Up:  Pharmacist Review   Charlann Lange, RMA Clinical Pharmacist Assistant [phone]  5 minutes spent in review, coordination, and documentation.  Reviewed by: Beverly Milch, PharmD Clinical Pharmacist [facility] [phone]

## 2020-04-03 ENCOUNTER — Other Ambulatory Visit: Payer: Self-pay

## 2020-04-03 ENCOUNTER — Encounter: Payer: Self-pay | Admitting: Gastroenterology

## 2020-04-03 NOTE — Progress Notes (Signed)
I spoke with the pt's granddaughter and she wishes to discuss next steps at the upcoming office visit. She did not want to set up any procedure appts at this time.  She is not sure how they wish to proceed and states she will discuss at the upcoming appt.

## 2020-04-03 NOTE — Progress Notes (Signed)
The proposed treatment discussed in conference is for discussion purposes only and is not a binding recommendation.  The patients have not been physically examined, or presented with their treatment options.  Therefore, final treatment plans cannot be decided.   

## 2020-04-03 NOTE — Progress Notes (Signed)
This patient's case was discussed at multidisciplinary conference this morning. We reviewed the patient's imaging studies as well as pathology and laboratories. Consensus after discussion with the entire group was that due to the patient's age surgical evaluation could be considered if that is in her wishes in regards to a potential segmental colectomy however that is not completely clear if it is within the patient or family's desires.  Performing a follow-up colonoscopy within 3 to 4 months to reevaluate the area was felt to be the next best step. Patient is scheduled for a follow-up in the next week and we will discuss that further with her and her family in regards to how she would like to approach things. Up to this point she has a normal CEA level and she has no imaging that shows an overt mass that is significant in that particular area. It is entirely possible that this was an intramucosal adenocarcinoma that could have been resected completely so we will be prepared at follow-up colonoscopy, if she chooses that route to reevaluate the site.  We will reach out to the patient and patient's family to discuss briefly the discussion and talk with her further at her follow-up in clinic.  Patty, please reach out to the patient's granddaughter and let her know that the consensus if the patient and family do not want a surgical evaluation was for repeat colonoscopy in a few months.  I am happy to discuss things further at her follow-up in the next 1 to 2 weeks that is already scheduled I believe.  Thanks. GM

## 2020-04-04 ENCOUNTER — Ambulatory Visit (HOSPITAL_COMMUNITY): Payer: Medicare Other

## 2020-04-05 ENCOUNTER — Institutional Professional Consult (permissible substitution): Payer: Medicare Other | Admitting: Pulmonary Disease

## 2020-04-09 ENCOUNTER — Ambulatory Visit: Payer: Medicare Other

## 2020-04-10 ENCOUNTER — Ambulatory Visit: Payer: Medicare Other | Admitting: Gastroenterology

## 2020-04-11 ENCOUNTER — Ambulatory Visit (HOSPITAL_COMMUNITY): Payer: Medicare Other

## 2020-04-16 ENCOUNTER — Ambulatory Visit: Payer: Medicare Other

## 2020-04-16 ENCOUNTER — Ambulatory Visit: Payer: Medicare Other | Admitting: Family Medicine

## 2020-04-19 ENCOUNTER — Other Ambulatory Visit: Payer: Self-pay

## 2020-04-19 ENCOUNTER — Ambulatory Visit (INDEPENDENT_AMBULATORY_CARE_PROVIDER_SITE_OTHER): Payer: Medicare Other | Admitting: Family Medicine

## 2020-04-19 ENCOUNTER — Encounter: Payer: Self-pay | Admitting: Family Medicine

## 2020-04-19 VITALS — BP 118/80 | HR 99 | Temp 98.0°F | Ht 66.0 in | Wt 164.0 lb

## 2020-04-19 DIAGNOSIS — J069 Acute upper respiratory infection, unspecified: Secondary | ICD-10-CM | POA: Diagnosis not present

## 2020-04-19 DIAGNOSIS — M810 Age-related osteoporosis without current pathological fracture: Secondary | ICD-10-CM | POA: Diagnosis not present

## 2020-04-19 MED ORDER — BENZONATATE 200 MG PO CAPS: 200.0000 mg | ORAL_CAPSULE | Freq: Three times a day (TID) | ORAL | 0 refills | Status: DC | PRN

## 2020-04-19 NOTE — Progress Notes (Signed)
Subjective:    Patient ID: Whitney [name], female    DOB: 08-10-1927, 87 y.o.   MRN: [id number]  HPI  Patient had a colonoscopy December 27 which revealed a 40 mm large polyp that was biopsied.  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  Patient was given the option over a partial colectomy versus rechecking in 3 to 4 months with a colonoscopy to see if the lesion had been removed in its entirety.  Given her age and her frailty they elected to recheck the lesion in 3 to 4 months.  She has an appointment to see the gastroenterologist in March.  We discussed the situation in detail today and she is comfortable currently with the plan.  I to agree that it is better given her frailty simply to monitor the area.  I believe she would be a high risk surgical candidate for a partial colectomy given her advanced age.  She presents today for a 1 week history of cough and head congestion and sore throat.  She reports a scratchy throat and postnasal drip.  She is taking Mucinex however at night the cough is unrelieved by the Mucinex.  She denies any fever or chills or shortness of breath or chest pain or purulent sputum or hemoptysis.  She denies any sinus pain.  On exam today nasal passages are clear other than some clear rhinorrhea.  There is no erythema or exudate in the posterior oropharynx and both sides of her lungs are clear on exam.  There is no evidence of pneumonia or wheezing.  Therefore I believe that she has an upper respiratory infection.  She has had symptoms now for more than a week and gradually believes that she is doing better Past Medical History:  Diagnosis Date  . Anemia   . Atrial fibrillation ([place])   . Breast cancer ([place]) 10/2013   right upper outer  . Cancer ([place])    right breast  . Complication of anesthesia    slow to wake up  . Diabetes mellitus without complication ([place])    type 2  . Dysrhythmia 10/15   AF  . Former smoker   .  Full dentures   . Hyperlipidemia   . Hypertension   . Multinodular goiter   . Osteoporosis   . Radiation    Right Breast  . Thyroid disease    hypothyroidism  . Wears glasses    Past Surgical History:  Procedure Laterality Date  . ABDOMINAL HYSTERECTOMY    . AXILLARY LYMPH NODE DISSECTION Right 01/09/2014   Procedure: RIGHT AXILLARY LYMPH NODE DISECTION;  Surgeon: [name], MD;  Location: [facility];  Service: General;  Laterality: Right;  . BIOPSY  10/26/2019   Procedure: BIOPSY;  Surgeon: Rush Landmark Telford Nab., MD;  Location: [facility];  Service: Gastroenterology;;  . BIOPSY  03/11/2020   Procedure: BIOPSY;  Surgeon: Irving Copas., MD;  Location: [place] [facility];  Service: Gastroenterology;;  . BREAST LUMPECTOMY WITH RADIOACTIVE SEED LOCALIZATION Right 01/09/2014   Procedure: RIGHT BREAST SEED LOCALIZED LUMPECTOMY ;  Surgeon: [name], MD;  Location: [facility];  Service: General;  Laterality: Right;  . COLONOSCOPY WITH PROPOFOL N/A 10/26/2019   Procedure: COLONOSCOPY WITH PROPOFOL;  Surgeon: Rush Landmark Telford Nab., MD;  Location: [facility];  Service: Gastroenterology;  Laterality: N/A;  . COLONOSCOPY WITH PROPOFOL N/A 03/11/2020   Procedure: COLONOSCOPY WITH PROPOFOL- Ultraslim scope;  Surgeon: Irving Copas., MD;  Location: [place];  Service: Gastroenterology;  Laterality: N/A;  . ENDOSCOPIC MUCOSAL RESECTION N/A 03/11/2020   Procedure: ENDOSCOPIC MUCOSAL RESECTION;  Surgeon: Mansouraty, Gabriel Jr., MD;  Location: [facility];  Service: Gastroenterology;  Laterality: N/A;  . ESOPHAGOGASTRODUODENOSCOPY (EGD) WITH PROPOFOL N/A 10/26/2019   Procedure: ESOPHAGOGASTRODUODENOSCOPY (EGD) WITH PROPOFOL;  Surgeon: Mansouraty, Gabriel Jr., MD;  Location: [facility];  Service: Gastroenterology;  Laterality: N/A;  . EYE SURGERY  12/22/2009   cataracts  . HEMOSTASIS CLIP PLACEMENT  10/26/2019   Procedure: HEMOSTASIS CLIP  PLACEMENT;  Surgeon: Mansouraty, Gabriel Jr., MD;  Location: [facility];  Service: Gastroenterology;;  marking for transverse lesion  . INTRAMEDULLARY (IM) NAIL INTERTROCHANTERIC Left 02/24/2014   Procedure: IM ROD LEFT HIP FX;  Surgeon: John L Graves, MD;  Location[facility];  Service: Orthopedics;  Laterality: Left;  . left distal radius fracture     2020  . POLYPECTOMY  10/26/2019   Procedure: POLYPECTOMY;  Surgeon: Mansouraty, Gabriel Jr., MD;  Location[facility];  Service: Gastroenterology;;  . POLYPECTOMY  03/11/2020   Procedure: POLYPECTOMY;  Surgeon: Mansouraty, Gabriel Jr., MD;  Location[facility];  Service: Gastroenterology;;  . PORT-A-CATH REMOVAL Right 01/09/2014   Procedure: REMOVAL PORT-A-CATH;  Surgeon: Thomas Cornett, MD;  Location: [facility];  Service: General;  Laterality: Right;  . PORTACATH PLACEMENT N/A 11/15/2013   Procedure: INSERTION PORT-A-CATH WITH ULTRA SOUND AND FLOROSCOPY;  Surgeon: Thomas Cornett, MD;  [facility];  Service: General;  Laterality: N/A;  . SUBMUCOSAL TATTOO INJECTION  10/26/2019   Procedure: SUBMUCOSAL TATTOO INJECTION;  Surgeon: Mansouraty, Gabriel Jr., MD;  Location[facility];  Service: Gastroenterology;;  . TOTAL KNEE ARTHROPLASTY  2002   left   Current Outpatient Medications on File Prior to Visit  Medication Sig Dispense Refill  . amiodarone (PACERONE) 200 MG tablet Take 1 tablet (200 mg total) by mouth daily. 90 tablet 3  . apixaban (ELIQUIS) 2.5 MG TABS tablet Take 1 tablet (2.5 mg total) by mouth 2 (two) times daily. Provider has put Patient on 2.5 mg twice daily. 90 tablet 3  . atorvastatin (LIPITOR) 20 MG tablet TAKE 1 TABLET BY MOUTH EVERY DAY 90 tablet 3  . Blood Glucose Monitoring Suppl (ONETOUCH VERIO) w/Device KIT Use to test blood sugars daily. 1 kit 0  . cholecalciferol (VITAMIN D3) 25 MCG (1000 UNIT) tablet TAKE 1 TAB BY MOUTH EVERY DAY 100 tablet 1  . docusate sodium (COLACE) 100 MG capsule Take 100 mg  by mouth daily.    . doxazosin (CARDURA) 4 MG tablet Take 0.5 tablets (2 mg total) by mouth daily.    . ferrous sulfate 324 (65 Fe) MG TBEC TAKE 1 TABLET BY MOUTH IN THE MORNING AND 1 AT BEDTIME (Patient taking differently: Take 324 mg by mouth daily.) 180 tablet 1  . furosemide (LASIX) 40 MG tablet Take 2 tablets (80 mg total) by mouth daily. 90 tablet 2  . KLOR-CON M20 20 MEQ tablet TAKE 1 TABLET BY MOUTH EVERY DAY 90 tablet 1  . meclizine (ANTIVERT) 12.5 MG tablet TAKE 1 TABLET BY MOUTH DAILY AS NEEDED FOR DIZZINESS 30 tablet 2  . metoprolol succinate (TOPROL-XL) 100 MG 24 hr tablet TAKE 1 TABLET BY MOUTH TWICE A DAY WITH OR IMMEDIATELY FOLLOWING A MEAL 180 tablet 1  . ONETOUCH VERIO test strip CHECK BLOOD SUGAR EVERY DAY AS DIRECTED 100 strip 2  . polyethylene glycol (MIRALAX / GLYCOLAX) 17 g packet Take 17 g by mouth daily. Also available OTC 30 each 0  .   sitaGLIPtin (JANUVIA) 100 MG tablet Take 0.5 tablets (50 mg total) by mouth daily. Take 1/2 tablet (50mg total) by mouth once daily. (Patient taking differently: Take 50 mg by mouth daily.) 90 tablet 1  . tamoxifen (NOLVADEX) 20 MG tablet TAKE 1 TABLET BY MOUTH EVERY DAY 90 tablet 3   Current Facility-Administered Medications on File Prior to Visit  Medication Dose Route Frequency Provider Last Rate Last Admin  . denosumab (PROLIA) injection 60 mg  60 mg Subcutaneous Q6 months ,  T, MD   60 mg at 04/19/20 1135   Allergies  Allergen Reactions  . Tape Other (See Comments)    Skin is somewhat sensitive   Social History   Socioeconomic History  . Marital status: Widowed    Spouse name: Not on file  . Number of children: 4  . Years of education: Not on file  . Highest education level: Not on file  Occupational History  . Not on file  Tobacco Use  . Smoking status: Former Smoker    Packs/day: 1.00    Years: 5.00    Pack years: 5.00    Quit date: 11/14/1958    Years since quitting: 61.4  . Smokeless tobacco: Never  Used  Vaping Use  . Vaping Use: Never used  Substance and Sexual Activity  . Alcohol use: No    Alcohol/week: 0.0 standard drinks  . Drug use: No  . Sexual activity: Not Currently    Birth control/protection: Post-menopausal    Comment: menarche age 15, P4, first birth age 22, no HRT, menopause age 57  Other Topics Concern  . Not on file  Social History Narrative  . Not on file   Social Determinants of Health   Financial Resource Strain: Low Risk   . Difficulty of Paying Living Expenses: Not very hard  Food Insecurity: Not on file  Transportation Needs: Not on file  Physical Activity: Not on file  Stress: Not on file  Social Connections: Not on file  Intimate Partner Violence: Not on file      Review of Systems  All other systems reviewed and are negative.      Objective:   Physical Exam Vitals reviewed.  Constitutional:      General: She is not in acute distress.    Appearance: Normal appearance. She is not ill-appearing or toxic-appearing.  HENT:     Right Ear: Tympanic membrane and ear canal normal.     Left Ear: Tympanic membrane and ear canal normal.     Nose: No congestion or rhinorrhea.     Mouth/Throat:     Pharynx: No oropharyngeal exudate or posterior oropharyngeal erythema.  Cardiovascular:     Rate and Rhythm: Normal rate. Rhythm irregular.     Heart sounds: Normal heart sounds.  Pulmonary:     Effort: Pulmonary effort is normal. No respiratory distress.     Breath sounds: Normal breath sounds. No wheezing, rhonchi or rales.  Chest:     Chest wall: No tenderness.  Abdominal:     General: Abdomen is flat. Bowel sounds are normal. There is no distension.     Palpations: Abdomen is soft.     Tenderness: There is no abdominal tenderness. There is no guarding or rebound.  Musculoskeletal:     Right lower leg: Edema present.     Left lower leg: Edema present.  Lymphadenopathy:     Cervical: No cervical adenopathy.  Neurological:     Mental Status:  She is alert.             Assessment & Plan:  Upper respiratory tract infection, unspecified type  Patient appears to have a mild cold.  I see no evidence of pneumonia or sinus infection or pharyngitis or bronchitis.  Is primarily postnasal drip and a cough at night that is bothering the patient.  Therefore I believe that we can only treat her symptoms.  I recommended Tessalon 200 mg every 8 hours as needed for cough.  She can take this in addition to Mucinex.  Family was asking about decongestants but I recommended against decongestants given her age, underlying cardiovascular disease, and atrial fibrillation.

## 2020-04-27 DIAGNOSIS — M810 Age-related osteoporosis without current pathological fracture: Secondary | ICD-10-CM | POA: Diagnosis not present

## 2020-04-27 DIAGNOSIS — I509 Heart failure, unspecified: Secondary | ICD-10-CM | POA: Diagnosis not present

## 2020-04-27 DIAGNOSIS — D649 Anemia, unspecified: Secondary | ICD-10-CM | POA: Diagnosis not present

## 2020-04-29 ENCOUNTER — Ambulatory Visit (HOSPITAL_COMMUNITY)
Admission: RE | Admit: 2020-04-29 | Discharge: 2020-04-29 | Disposition: A | Discharge status: Home / Self Care | Payer: Medicare Other | Source: Ambulatory Visit | Attending: Gastroenterology | Admitting: Gastroenterology

## 2020-04-29 ENCOUNTER — Encounter (HOSPITAL_COMMUNITY): Payer: Self-pay

## 2020-04-29 ENCOUNTER — Other Ambulatory Visit: Payer: Self-pay

## 2020-04-29 DIAGNOSIS — E279 Disorder of adrenal gland, unspecified: Secondary | ICD-10-CM | POA: Insufficient documentation

## 2020-04-29 DIAGNOSIS — J90 Pleural effusion, not elsewhere classified: Secondary | ICD-10-CM | POA: Diagnosis not present

## 2020-04-29 DIAGNOSIS — E042 Nontoxic multinodular goiter: Secondary | ICD-10-CM | POA: Diagnosis not present

## 2020-04-29 DIAGNOSIS — I700 Atherosclerosis of aorta: Secondary | ICD-10-CM | POA: Insufficient documentation

## 2020-04-29 DIAGNOSIS — I2510 Atherosclerotic heart disease of native coronary artery without angina pectoris: Secondary | ICD-10-CM | POA: Diagnosis not present

## 2020-04-29 DIAGNOSIS — S2241XA Multiple fractures of ribs, right side, initial encounter for closed fracture: Secondary | ICD-10-CM | POA: Diagnosis not present

## 2020-04-29 DIAGNOSIS — D369 Benign neoplasm, unspecified site: Secondary | ICD-10-CM | POA: Insufficient documentation

## 2020-04-29 DIAGNOSIS — J9819 Other pulmonary collapse: Secondary | ICD-10-CM | POA: Diagnosis not present

## 2020-04-29 LAB — POCT I-STAT CREATININE: Creatinine, Ser: 1.3 mg/dL — ABNORMAL HIGH (ref 0.44–1.00)

## 2020-04-29 MED ORDER — IOHEXOL 300 MG/ML  SOLN
75.0000 mL | Freq: Once | INTRAMUSCULAR | Status: AC | PRN
  Administered 2020-04-29: 60 mL via INTRAVENOUS

## 2020-04-30 ENCOUNTER — Telehealth: Payer: Self-pay | Admitting: Hematology and Oncology

## 2020-04-30 NOTE — Telephone Encounter (Signed)
I called her with the results of CT chest  It showed a large pleural effusion. She tells me that she doesn't have any SOB. Given her age, we decided to watch it. I instructed her to call me if her breathing gets worse so that the effusion can be drained.

## 2020-05-01 ENCOUNTER — Telehealth: Payer: Self-pay | Admitting: Gastroenterology

## 2020-05-01 NOTE — Telephone Encounter (Signed)
I called and spoke with one of the patient's granddaughters at the number that was listed in the chart under home phone.  The mobile number for Whitney [name] was not set up for voicemails and kept going to that directly. I discussed the results of the CT chest. My concern in regards to the large pleural effusion. I was told by the patient's granddaughter that she is doing fine without any shortness of breath. Unfortunately, the patient's family is having significant issues from a transportation perspective as well as need of getting to work. I spoke with her at great length trying to ask if there was anything that we could do to help support them in regards to social work help or other transportation needs so that the patient can get to her scheduled clinic visits including the upcoming consultation with Dr. Vaughan Browner from pulmonary and also follow-ups with myself and any follows with Dr. Payton Mccallum and Dr. Dennard Schaumann.  She describes that she does not know when things will get better and she does not want to have help at this time.  She wants to cancel appointments for now and does not give any further information why. I asked her to please let us know or let Dr. Dennard Schaumann know how we can be of assistance to try and help her grandmother. She then hung up the phone. When I try to call back she did not answer the phone.  This is not ideal and certainly would like to have better communication with the patient and her family but I am not sure what else we can do at this time.  I am forwarding this information to her PCP as well as to the rest of her team to see if there is anything else that we can do.  FYI Dr. Vaughan Browner, it is possible she is going to not make her appointment next week.  FYI Patty, lets keep my appointment as is scheduled but I am happy to change it to a telemedicine visit or telephone visit if that allows [place] to speak with her so may be in a couple of weeks you can reach out to the family again.  If  the patient/family do not come for our clinic visit we will send a certified letter in the mail for documentation purposes as well.  Justice Britain, MD [facility] # [phone]

## 2020-05-01 NOTE — Telephone Encounter (Signed)
-----   Message from Timothy Lasso, RN sent at 05/01/2020  1:08 PM EST ----- I called and spoke with the granddaughter to explain the importance of the pt  keeping appointments with pulmonary and Dr Rush Landmark.  The granddaughter tells me that she does not have a car and no one can take her to the appointments.  I again tried to explain that if at all possible the pt needs to keep the appointments; especially with pulmonary.  She states,  "I am going to cancel it. I can not take her an hung up."

## 2020-05-02 NOTE — Telephone Encounter (Signed)
Thanks for the info.  There is a [facility] transportation resource to help her come for the appointment on 2/22 . I am sending this to the PCCM triage group to get in touch with the patient and get her the help she needs.  Marshell Garfinkel MD  Pulmonary & Critical care

## 2020-05-02 NOTE — Telephone Encounter (Signed)
ATC all numbers on file. Only able to leave a VM for Whitney [name], listed as an emergency contact but not on DPR. Called Whitney [name] (daughter on [place]) at [phone], unable to leave VM.   [facility] system: [phone].   Will hold in triage to try calling again.

## 2020-05-03 ENCOUNTER — Telehealth: Payer: Self-pay | Admitting: *Deleted

## 2020-05-03 ENCOUNTER — Telehealth: Payer: Self-pay | Admitting: Pharmacist

## 2020-05-03 MED ORDER — ATORVASTATIN CALCIUM 10 MG PO TABS: 10.0000 mg | ORAL_TABLET | Freq: Every day | ORAL | 1 refills | Status: AC

## 2020-05-03 NOTE — Telephone Encounter (Signed)
Tried to call pt and all other numbers again but unable to get ahold of anyone. Will leave in triage for [place] to try to call pt again prior to her appt with Dr. Vaughan Browner on 2/22 for transportation needs.

## 2020-05-03 NOTE — Telephone Encounter (Signed)
-----   Message from Rosetta Posner sent at 05/03/2020  2:06 PM EST ----- Regarding: Change/New Rx Good afternoon the insurance reached out about the patients Atorvastatin 20 mg tablet. Spoke with the patients daughter and she is currently talking a half of the 20 mg tablet and they have been cutting it in half themselves The patents daughter and the insurance company believes for safety reasons it would be easier and safer to simply send in a new RX of Atorvastatin 10 mg to the patients pharmacy so the patient doesn't have to worry about cutting pills in half. Thank you.

## 2020-05-03 NOTE — Telephone Encounter (Signed)
Prescription sent to pharmacy.

## 2020-05-03 NOTE — Progress Notes (Addendum)
Chronic Care Management Pharmacy Assistant   Name: Whitney [name]  MRN: [id number] DOB: 1927-06-04  Reason for Encounter: Medication Review  PCP : [name], MD  Allergies:   Allergies  Allergen Reactions   Tape Other (See Comments)    Skin is somewhat sensitive    Medications: Outpatient Encounter Medications as of 05/03/2020  Medication Sig   amiodarone (PACERONE) 200 MG tablet Take 1 tablet (200 mg total) by mouth daily.   apixaban (ELIQUIS) 2.5 MG TABS tablet Take 1 tablet (2.5 mg total) by mouth 2 (two) times daily. Provider has put Patient on 2.5 mg twice daily.   atorvastatin (LIPITOR) 20 MG tablet TAKE 1 TABLET BY MOUTH EVERY DAY   benzonatate (TESSALON) 200 MG capsule Take 1 capsule (200 mg total) by mouth 3 (three) times daily as needed for cough.   Blood Glucose Monitoring Suppl (ONETOUCH VERIO) w/Device KIT Use to test blood sugars daily.   cholecalciferol (VITAMIN D3) 25 MCG (1000 UNIT) tablet TAKE 1 TAB BY MOUTH EVERY DAY   docusate sodium (COLACE) 100 MG capsule Take 100 mg by mouth daily.   doxazosin (CARDURA) 4 MG tablet Take 0.5 tablets (2 mg total) by mouth daily.   ferrous sulfate 324 (65 Fe) MG TBEC TAKE 1 TABLET BY MOUTH IN THE MORNING AND 1 AT BEDTIME (Patient taking differently: Take 324 mg by mouth daily.)   furosemide (LASIX) 40 MG tablet Take 2 tablets (80 mg total) by mouth daily.   KLOR-CON M20 20 MEQ tablet TAKE 1 TABLET BY MOUTH EVERY DAY   meclizine (ANTIVERT) 12.5 MG tablet TAKE 1 TABLET BY MOUTH DAILY AS NEEDED FOR DIZZINESS   metoprolol succinate (TOPROL-XL) 100 MG 24 hr tablet TAKE 1 TABLET BY MOUTH TWICE A DAY WITH OR IMMEDIATELY FOLLOWING A MEAL   ONETOUCH VERIO test strip CHECK BLOOD SUGAR EVERY DAY AS DIRECTED   polyethylene glycol (MIRALAX / GLYCOLAX) 17 g packet Take 17 g by mouth daily. Also available OTC   sitaGLIPtin (JANUVIA) 100 MG tablet Take 0.5 tablets (50 mg total) by mouth daily. Take 1/2 tablet (13m total) by mouth  once daily. (Patient taking differently: Take 50 mg by mouth daily.)   tamoxifen (NOLVADEX) 20 MG tablet TAKE 1 TABLET BY MOUTH EVERY DAY   Facility-Administered Encounter Medications as of 05/03/2020  Medication   denosumab (PROLIA) injection 60 mg    Current Diagnosis: Patient Active Problem List   Diagnosis Date Noted   History of colon polyps 12/01/2019   Colitis, acute 12/01/2019   Diverticulosis 12/01/2019   Stenosis colon ([place] 12/01/2019   Abnormal colonoscopy 12/01/2019   Anemia 10/25/2019   Bright red blood per rectum 10/24/2019   Chronic kidney disease, stage 3a ([place] 10/24/2019   Coagulopathy ([facility]    Osteoporosis    Unilateral primary osteoarthritis, right knee 03/25/2018   Chronic pain of right knee 05/25/2017   Bacteremia 10/25/2016   Transaminitis 10/23/2016   Sinus bradycardia 10/23/2016   Controlled diabetes mellitus type 2 with complications (HCC)    Paroxysmal atrial fibrillation (HCC)    Chronic diastolic CHF (congestive heart failure) ([facility]    Pulmonary hypertension ([facility]    Acute renal failure superimposed on stage 2 chronic kidney disease ([facility]    Elevated liver enzymes    Bacteremia due to Escherichia coli 12/30/2015   UTI (urinary tract infection) 12/30/2015   Acute blood loss anemia 02/25/2014   Closed left hip fracture ([facility] 02/24/2014   PAF (paroxysmal atrial fibrillation) ([facility]  01/30/2014   Anticoagulated 01/30/2014   Chronic diastolic CHF (congestive heart failure), NYHA class 2 ([place]) 12/10/2013   Preoperative cardiovascular examination 12/10/2013   Neutropenic fever ([place]) 12/08/2013   Anemia of chronic disease 12/08/2013   Thrombocytopenia ([place]) 12/08/2013   Hypokalemia 12/08/2013   Pancytopenia due to antineoplastic chemotherapy ([place]) 12/08/2013   Hypothyroidism 12/08/2013   Breast cancer of upper-outer quadrant of right female breast ([place]) 11/01/2013   Essential hypertension, benign 12/01/2012   Pure hypercholesterolemia 12/01/2012    DM2 (diabetes mellitus, type 2) ([place]) 11/21/2012    Goals Addressed   None    I called patient to get an updated information on the adherence of her Atorvastatin 20 mg tablet. I spoke with the patients daughter and she stated she's been cutting the 20 mg in half and she does not see or have any reason why her mom cant go to Atorvastatin 10 mg daily. She stated that would be okay to start but she also stated she has already cut all of the 20 mg tablets in half and placed them back into the bottle, she believes it may be about 2-3 weeks left . Sent a message to Dr. Antony Contras nurse with the updated information asking for a new RX of Atorvastatin 10 mg daily be sent to the patients pharmacy  Follow-Up:  Pharmacist Review   Charlann Lange, [place] Pharmacist Assistant [phone]  5 minutes spent in review, coordination, and documentation.  Reviewed by: Beverly Milch, PharmD Clinical Pharmacist [facility] [phone]

## 2020-05-03 NOTE — Telephone Encounter (Signed)
Family dropped off jury summons for patient requesting letter to excuse.   Summons placed in green folder.

## 2020-05-06 ENCOUNTER — Other Ambulatory Visit: Payer: Self-pay | Admitting: Family Medicine

## 2020-05-06 NOTE — Telephone Encounter (Signed)
Spoke with [place], pt's granddaughter  Advised I was calling to offer number to call [employer] services She stated to me that this was not needed and that they would figure out a way for her to get here for her appt on 05/07/20  Nothing further needed

## 2020-05-07 ENCOUNTER — Other Ambulatory Visit: Payer: Self-pay

## 2020-05-07 ENCOUNTER — Ambulatory Visit: Payer: Medicare Other | Admitting: Pulmonary Disease

## 2020-05-07 ENCOUNTER — Encounter: Payer: Self-pay | Admitting: Pulmonary Disease

## 2020-05-07 ENCOUNTER — Encounter: Payer: Self-pay | Admitting: Family Medicine

## 2020-05-07 VITALS — BP 126/78 | HR 85 | Temp 97.4°F | Ht 66.0 in | Wt 166.4 lb

## 2020-05-07 DIAGNOSIS — J90 Pleural effusion, not elsewhere classified: Secondary | ICD-10-CM

## 2020-05-07 NOTE — Telephone Encounter (Signed)
Letter completed and placed at front desk for pick up.   Call placed to patient and patient made aware.

## 2020-05-07 NOTE — Patient Instructions (Addendum)
We will schedule you for a follow-up visit on March 7 at 1:30 PM to drain the fluid around the lung  Please hold the blood thinner called Apixaban or Eliquis before the procedure.  Take the last dose on Friday, March 4 and do not take any more doses over the weekend until the procedure is done.

## 2020-05-07 NOTE — Telephone Encounter (Signed)
Pt granddaughter came in to pick up forms for pt. I explained that the office will call her when they are ready to be picked up.  Cb#:[phone]

## 2020-05-07 NOTE — Progress Notes (Signed)
Whitney [name]    [id number]    06/25/1927  Primary Care Physician:Pickard, Cammie Mcgee, MD  Referring Physician: Irving Copas., MD [place],  [place] [id number]  Chief complaint: Consult for right pleural effusion  HPI: 87 year old with history of breast cancer, hypertension, diabetes, atrial fibrillation, she had a recent colonoscopy by [name] on 03/11/2020 with findings of high-grade tubular adenoma Follow-up CT chest revealed a large right pleural effusion she has been referred to [facility] for further evaluation  History notable for right breast cancer with in 2015 which was treated with lumpectomy, chemo, radiation.  She is currently on tamoxifen under the care of Dr. Lindi Adie.  She feels well with no complaints of cough, dyspnea, hemoptysis, loss of weight, loss of appetite  Pets: No pets Occupation: Retired Systems analyst Exposures: No exposures.  No mold, hot tub, Jacuzzi.  No feather pillows or comforters Smoking history: Quit smoking in the 1950s Travel history: No significant travel history Relevant family history: No family history of lung disease  Outpatient Encounter Medications as of 05/07/2020  Medication Sig  . amiodarone (PACERONE) 200 MG tablet Take 1 tablet (200 mg total) by mouth daily.  Marland Kitchen apixaban (ELIQUIS) 2.5 MG TABS tablet Take 1 tablet (2.5 mg total) by mouth 2 (two) times daily. Provider has put Patient on 2.5 mg twice daily.  Marland Kitchen atorvastatin (LIPITOR) 10 MG tablet Take 1 tablet (10 mg total) by mouth daily.  . benzonatate (TESSALON) 200 MG capsule Take 1 capsule (200 mg total) by mouth 3 (three) times daily as needed for cough.  . Blood Glucose Monitoring Suppl (ONETOUCH VERIO) w/Device KIT Use to test blood sugars daily.  . cholecalciferol (VITAMIN D3) 25 MCG (1000 UNIT) tablet TAKE 1 TAB BY MOUTH EVERY DAY  . docusate sodium (COLACE) 100 MG capsule Take 100 mg by mouth daily.  Marland Kitchen doxazosin (CARDURA) 4 MG tablet Take 0.5  tablets (2 mg total) by mouth daily.  . ferrous sulfate 324 (65 Fe) MG TBEC TAKE 1 TABLET BY MOUTH IN THE MORNING AND 1 AT BEDTIME (Patient taking differently: Take 324 mg by mouth daily.)  . furosemide (LASIX) 40 MG tablet Take 2 tablets (80 mg total) by mouth daily.  Marland Kitchen KLOR-CON M20 20 MEQ tablet TAKE 1 TABLET BY MOUTH EVERY DAY  . meclizine (ANTIVERT) 12.5 MG tablet TAKE 1 TABLET BY MOUTH DAILY AS NEEDED FOR DIZZINESS  . metoprolol succinate (TOPROL-XL) 100 MG 24 hr tablet TAKE 1 TABLET BY MOUTH TWICE A DAY WITH OR IMMEDIATELY FOLLOWING A MEAL  . ONETOUCH VERIO test strip CHECK BLOOD SUGAR EVERY DAY AS DIRECTED  . polyethylene glycol (MIRALAX / GLYCOLAX) 17 g packet Take 17 g by mouth daily. Also available OTC  . sitaGLIPtin (JANUVIA) 100 MG tablet Take 0.5 tablets (50 mg total) by mouth daily. Take 1/2 tablet (59m total) by mouth once daily. (Patient taking differently: Take 50 mg by mouth daily.)  . tamoxifen (NOLVADEX) 20 MG tablet TAKE 1 TABLET BY MOUTH EVERY DAY   Facility-Administered Encounter Medications as of 05/07/2020  Medication  . denosumab (PROLIA) injection 60 mg    Allergies as of 05/07/2020 - Review Complete 05/07/2020  Allergen Reaction Noted  . Tape Other (See Comments) 12/31/2015    Past Medical History:  Diagnosis Date  . Anemia   . Atrial fibrillation ([facility]   . Breast cancer ([facility] 10/2013   right upper outer  . Cancer ([facility]  right breast  . Complication of anesthesia    slow to wake up  . Diabetes mellitus without complication ([place])    type 2  . Dysrhythmia 10/15   AF  . Former smoker   . Full dentures   . Hyperlipidemia   . Hypertension   . Multinodular goiter   . Osteoporosis   . Radiation    Right Breast  . Thyroid disease    hypothyroidism  . Wears glasses     Past Surgical History:  Procedure Laterality Date  . ABDOMINAL HYSTERECTOMY    . AXILLARY LYMPH NODE DISSECTION Right 01/09/2014   Procedure: RIGHT AXILLARY LYMPH NODE DISECTION;   Surgeon: [name], MD;  Location: [facility];  Service: General;  Laterality: Right;  . BIOPSY  10/26/2019   Procedure: BIOPSY;  Surgeon: Rush Landmark Telford Nab., MD;  Location[facility];  Service: Gastroenterology;;  . BIOPSY  03/11/2020   Procedure: BIOPSY;  Surgeon: Irving Copas., MD;  Location: [facility];  Service: Gastroenterology;;  . BREAST LUMPECTOMY WITH RADIOACTIVE SEED LOCALIZATION Right 01/09/2014   Procedure: RIGHT BREAST SEED LOCALIZED LUMPECTOMY ;  Surgeon: [name], MD;  Location: [facility];  Service: General;  Laterality: Right;  . COLONOSCOPY WITH PROPOFOL N/A 10/26/2019   Procedure: COLONOSCOPY WITH PROPOFOL;  Surgeon: Rush Landmark Telford Nab., MD;  [facility];  Service: Gastroenterology;  Laterality: N/A;  . COLONOSCOPY WITH PROPOFOL N/A 03/11/2020   Procedure: COLONOSCOPY WITH PROPOFOL- Ultraslim scope;  Surgeon: Irving Copas., MD;  Location: [facility];  Service: Gastroenterology;  Laterality: N/A;  . ENDOSCOPIC MUCOSAL RESECTION N/A 03/11/2020   Procedure: ENDOSCOPIC MUCOSAL RESECTION;  Surgeon: Rush Landmark Telford Nab., MD;  Location: [facility];  Service: Gastroenterology;  Laterality: N/A;  . ESOPHAGOGASTRODUODENOSCOPY (EGD) WITH PROPOFOL N/A 10/26/2019   Procedure: ESOPHAGOGASTRODUODENOSCOPY (EGD) WITH PROPOFOL;  Surgeon: Rush Landmark Telford Nab., MD;  [facility];  Service: Gastroenterology;  Laterality: N/A;  . EYE SURGERY  12/22/2009   cataracts  . HEMOSTASIS CLIP PLACEMENT  10/26/2019   Procedure: HEMOSTASIS CLIP PLACEMENT;  Surgeon: Irving Copas., MD;  [facility];  Service: Gastroenterology;;  marking for transverse lesion  . INTRAMEDULLARY (IM) NAIL INTERTROCHANTERIC Left 02/24/2014   Procedure: IM ROD LEFT HIP FX;  Surgeon: Alta Corning, MD;  Location: [facility];  Service: Orthopedics;  Laterality: Left;  . left distal radius fracture     2020  .  POLYPECTOMY  10/26/2019   Procedure: POLYPECTOMY;  Surgeon: Mansouraty, Telford Nab., MD;  Location: [facility];  Service: Gastroenterology;;  . POLYPECTOMY  03/11/2020   Procedure: POLYPECTOMY;  Surgeon: Irving Copas., MD;  Location: [facility];  Service: Gastroenterology;;  . PORT-A-CATH REMOVAL Right 01/09/2014   Procedure: REMOVAL PORT-A-CATH;  Surgeon: [name], MD;  Location: [facility];  Service: General;  Laterality: Right;  . PORTACATH PLACEMENT N/A 11/15/2013   Procedure: INSERTION PORT-A-CATH WITH ULTRA SOUND AND FLOROSCOPY;  Surgeon: [name], MD;  Location: [facility];  Service: General;  Laterality: N/A;  . SUBMUCOSAL TATTOO INJECTION  10/26/2019   Procedure: SUBMUCOSAL TATTOO INJECTION;  Surgeon: Irving Copas., MD;  [facility];  Service: Gastroenterology;;  . TOTAL KNEE ARTHROPLASTY  2002   left    Family History  Problem Relation Age of Onset  . Colon cancer Neg Hx   . Esophageal cancer Neg Hx   . Inflammatory bowel disease Neg Hx   . Liver disease Neg Hx   . Pancreatic cancer Neg Hx   .  Rectal cancer Neg Hx   . Stomach cancer Neg Hx     Social History   Socioeconomic History  . Marital status: Widowed    Spouse name: Not on file  . Number of children: 4  . Years of education: Not on file  . Highest education level: Not on file  Occupational History  . Not on file  Tobacco Use  . Smoking status: Former Smoker    Packs/day: 1.00    Years: 5.00    Pack years: 5.00    Quit date: 11/14/1958    Years since quitting: 61.5  . Smokeless tobacco: Never Used  Vaping Use  . Vaping Use: Never used  Substance and Sexual Activity  . Alcohol use: No    Alcohol/week: 0.0 standard drinks  . Drug use: No  . Sexual activity: Not Currently    Birth control/protection: Post-menopausal    Comment: menarche age 72, P44, first birth age 48, no HRT, menopause age 39  Other Topics Concern  . Not on file  Social History  Narrative  . Not on file   Social Determinants of Health   Financial Resource Strain: Low Risk   . Difficulty of Paying Living Expenses: Not very hard  Food Insecurity: Not on file  Transportation Needs: Not on file  Physical Activity: Not on file  Stress: Not on file  Social Connections: Not on file  Intimate Partner Violence: Not on file    Review of systems: Review of Systems  Constitutional: Negative for fever and chills.  HENT: Negative.   Eyes: Negative for blurred vision.  Respiratory: as per HPI  Cardiovascular: Negative for chest pain and palpitations.  Gastrointestinal: Negative for vomiting, diarrhea, blood per rectum. Genitourinary: Negative for dysuria, urgency, frequency and hematuria.  Musculoskeletal: Negative for myalgias, back pain and joint pain.  Skin: Negative for itching and rash.  Neurological: Negative for dizziness, tremors, focal weakness, seizures and loss of consciousness.  Endo/Heme/Allergies: Negative for environmental allergies.  Psychiatric/Behavioral: Negative for depression, suicidal ideas and hallucinations.  All other systems reviewed and are negative.  Physical Exam: Blood pressure 126/78, pulse 85, temperature (!) 97.4 F (36.3 C), temperature source Temporal, height 5' 6"  (1.676 m), weight 166 lb 6.4 oz (75.5 kg), SpO2 99 %. Gen:      No acute distress HEENT:  EOMI, sclera anicteric Neck:     No masses; no thyromegaly Lungs:    Diminished breath sounds on the right CV:         Regular rate and rhythm; no murmurs Abd:      + bowel sounds; soft, non-tender; no palpable masses, no distension Ext:    No edema; adequate peripheral perfusion Skin:      Warm and dry; no rash Neuro: alert and oriented x 3 Psych: normal mood and affect  Data Reviewed: Imaging: CT abdomen pelvis 01/01/2016-trace right effusion  CT abdomen pelvis 01/12/2020-moderate right effusion with compressive atelectasis  CT abdomen pelvis 04/29/2020-large right  effusion with collapse/consolidation of the right lower and right middle lobe I have reviewed the images personally  PFTs:  Labs: SARS-CoV-2 test 10/24/2019, 01/13/2020, 03/07/2020-negative  Assessment:  Right effusion This has been present at least since November 2021 but it was noted on CT abdomen pelvis and is getting bigger.  She does not appear infected with no clinical evidence of pneumonia.  Concern is for malignant effusion given recent findings of high-grade tubular adenoma and history of breast cancer  I would recommend a thoracentesis for further  evaluation.  Discussed further work-up, risk/benefit in detail today with patient and granddaughter.  Transportation is difficult for the next few weeks and she would find it hard to come to the hospital and get preCovid tested.  I have arranged an outpatient visit with thoracentesis in the clinic on 3/7. She will hold her Eliquis for at least 2 days before procedure.  Plan/Recommendations: Outpatient thoracentesis on 3/7 Hold Eliquis for 2 days before procedure  Marshell Garfinkel MD  Pulmonary and Critical Care 05/07/2020, 9:39 AM  CC: Mansouraty, Telford Nab.*

## 2020-05-13 DIAGNOSIS — E119 Type 2 diabetes mellitus without complications: Secondary | ICD-10-CM | POA: Diagnosis not present

## 2020-05-14 ENCOUNTER — Other Ambulatory Visit: Payer: Self-pay

## 2020-05-14 ENCOUNTER — Ambulatory Visit (INDEPENDENT_AMBULATORY_CARE_PROVIDER_SITE_OTHER): Payer: Medicare Other | Admitting: Family Medicine

## 2020-05-14 VITALS — BP 110/76 | HR 71 | Temp 97.0°F | Resp 18 | Ht 69.0 in

## 2020-05-14 DIAGNOSIS — R682 Dry mouth, unspecified: Secondary | ICD-10-CM | POA: Diagnosis not present

## 2020-05-14 NOTE — Progress Notes (Signed)
Subjective:    Patient ID: Whitney [name], female    DOB: 1927-08-16, 87 y.o.   MRN: [id number]  HPI   Patient had a colonoscopy December 27 which revealed a 40 mm large polyp that was biopsied.  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  Patient was given the option over a partial colectomy versus rechecking in 3 to 4 months with a colonoscopy to see if the lesion had been removed in its entirety.  Recently they have discovered a large right-sided pleural effusion.  She has seen pulmonology and they are planning to do a diagnostic thoracentesis to rule out a malignant effusion due to her recent colon cancer diagnosis.  This is being planned for next week.  However the patient is here today complaining of dry mouth.  She denies any sore throat.  This chief complaint is incorrect.  She denies any rhinorrhea or postnasal drip or sinus pain or fever or chills.  She states that when she wakes up in the morning she has dry mouth.  She has tried Biotene without any improvement.  Of note she is on high-dose Lasix due to significant edema in her legs.  She also has the large right-sided pleural effusion.  Exam today, mucous membranes are moist.  She still making tears.  She still has +1 pitting edema bilaterally despite wearing compression hose.  She does not appear dehydrated Past Surgical History:  Procedure Laterality Date  . ABDOMINAL HYSTERECTOMY    . AXILLARY LYMPH NODE DISSECTION Right 01/09/2014   Procedure: RIGHT AXILLARY LYMPH NODE DISECTION;  Surgeon: [name], MD;  Location: [place];  Service: General;  Laterality: Right;  . BIOPSY  10/26/2019   Procedure: BIOPSY;  Surgeon: Rush Landmark Telford Nab., MD;  Location: [facility];  Service: Gastroenterology;;  . BIOPSY  03/11/2020   Procedure: BIOPSY;  Surgeon: Irving Copas., MD;  Location: [place];  Service: Gastroenterology;;  . BREAST LUMPECTOMY WITH  RADIOACTIVE SEED LOCALIZATION Right 01/09/2014   Procedure: RIGHT BREAST SEED LOCALIZED LUMPECTOMY ;  Surgeon: [name], MD;  Location: [place];  Service: General;  Laterality: Right;  . COLONOSCOPY WITH PROPOFOL N/A 10/26/2019   Procedure: COLONOSCOPY WITH PROPOFOL;  Surgeon: Rush Landmark Telford Nab., MD;  Location: [facility];  Service: Gastroenterology;  Laterality: N/A;  . COLONOSCOPY WITH PROPOFOL N/A 03/11/2020   Procedure: COLONOSCOPY WITH PROPOFOL- Ultraslim scope;  Surgeon: Irving Copas., MD;  Location: [place];  Service: Gastroenterology;  Laterality: N/A;  . ENDOSCOPIC MUCOSAL RESECTION N/A 03/11/2020   Procedure: ENDOSCOPIC MUCOSAL RESECTION;  Surgeon: Rush Landmark Telford Nab., MD;  Location: [place];  Service: Gastroenterology;  Laterality: N/A;  . ESOPHAGOGASTRODUODENOSCOPY (EGD) WITH PROPOFOL N/A 10/26/2019   Procedure: ESOPHAGOGASTRODUODENOSCOPY (EGD) WITH PROPOFOL;  Surgeon: Rush Landmark Telford Nab., MD;  Location: [facility];  Service: Gastroenterology;  Laterality: N/A;  . EYE SURGERY  12/22/2009   cataracts  . HEMOSTASIS CLIP PLACEMENT  10/26/2019   Procedure: HEMOSTASIS CLIP PLACEMENT;  Surgeon: Irving Copas., MD;  Location: [facility];  Service: Gastroenterology;;  marking for transverse lesion  . INTRAMEDULLARY (IM) NAIL INTERTROCHANTERIC Left 02/24/2014   Procedure: IM ROD LEFT HIP FX;  Surgeon: Alta Corning, MD;  Location: [place];  Service: Orthopedics;  Laterality: Left;  . left distal radius fracture     2020  . POLYPECTOMY  10/26/2019   Procedure: POLYPECTOMY;  Surgeon: Mansouraty, Telford Nab., MD;  Location: [facility];  Service: Gastroenterology;;  . POLYPECTOMY  03/11/2020   Procedure: POLYPECTOMY;  Surgeon: Rush Landmark Telford Nab., MD;  Location: [place];  Service: Gastroenterology;;  . PORT-A-CATH REMOVAL Right 01/09/2014   Procedure: REMOVAL PORT-A-CATH;  Surgeon: [name], MD;  Location: [place];  Service: General;  Laterality: Right;  . PORTACATH PLACEMENT N/A 11/15/2013   Procedure: INSERTION PORT-A-CATH WITH ULTRA SOUND AND FLOROSCOPY;  Surgeon: [name], MD;  Location: [place];  Service: General;  Laterality: N/A;  . SUBMUCOSAL TATTOO INJECTION  10/26/2019   Procedure: SUBMUCOSAL TATTOO INJECTION;  Surgeon: Irving Copas., MD;  Location: [facility];  Service: Gastroenterology;;  . TOTAL KNEE ARTHROPLASTY  2002   left   Current Outpatient Medications on File Prior to Visit  Medication Sig Dispense Refill  . amiodarone (PACERONE) 200 MG tablet Take 1 tablet (200 mg total) by mouth daily. 90 tablet 3  . apixaban (ELIQUIS) 2.5 MG TABS tablet Take 1 tablet (2.5 mg total) by mouth 2 (two) times daily. Provider has put Patient on 2.5 mg twice daily. 90 tablet 3  . atorvastatin (LIPITOR) 10 MG tablet Take 1 tablet (10 mg total) by mouth daily. 90 tablet 1  . benzonatate (TESSALON) 200 MG capsule Take 1 capsule (200 mg total) by mouth 3 (three) times daily as needed for cough. 20 capsule 0  . Blood Glucose Monitoring Suppl (ONETOUCH VERIO) w/Device KIT Use to test blood sugars daily. 1 kit 0  . cholecalciferol (VITAMIN D3) 25 MCG (1000 UNIT) tablet TAKE 1 TAB BY MOUTH EVERY DAY 100 tablet 1  . docusate sodium (COLACE) 100 MG capsule Take 100 mg by mouth daily.    Marland Kitchen doxazosin (CARDURA) 4 MG tablet Take 0.5 tablets (2 mg total) by mouth daily.    . ferrous sulfate 324 (65 Fe) MG TBEC TAKE 1 TABLET BY MOUTH IN THE MORNING AND 1 AT BEDTIME 180 tablet 1  . furosemide (LASIX) 40 MG tablet Take 2 tablets (80 mg total) by mouth daily. 90 tablet 2  . KLOR-CON M20 20 MEQ tablet TAKE 1 TABLET BY MOUTH EVERY DAY 90 tablet 1  . meclizine (ANTIVERT) 12.5 MG tablet TAKE 1 TABLET BY MOUTH DAILY AS NEEDED FOR DIZZINESS 30 tablet 2  . metoprolol succinate (TOPROL-XL) 100 MG 24 hr tablet TAKE 1 TABLET BY MOUTH TWICE A DAY WITH OR IMMEDIATELY FOLLOWING A MEAL 180 tablet 1  .  ONETOUCH VERIO test strip CHECK BLOOD SUGAR EVERY DAY AS DIRECTED 100 strip 2  . polyethylene glycol (MIRALAX / GLYCOLAX) 17 g packet Take 17 g by mouth daily. Also available OTC 30 each 0  . sitaGLIPtin (JANUVIA) 100 MG tablet Take 0.5 tablets (50 mg total) by mouth daily. Take 1/2 tablet (41m total) by mouth once daily. (Patient taking differently: Take 50 mg by mouth daily.) 90 tablet 1  . tamoxifen (NOLVADEX) 20 MG tablet TAKE 1 TABLET BY MOUTH EVERY DAY 90 tablet 3   Current Facility-Administered Medications on File Prior to Visit  Medication Dose Route Frequency Provider Last Rate Last Admin  . denosumab (PROLIA) injection 60 mg  60 mg Subcutaneous Q6 months PSusy Frizzle MD   60 mg at 04/19/20 1135   Allergies  Allergen Reactions  . Tape Other (See Comments)    Skin is somewhat sensitive   Social History   Socioeconomic History  . Marital status: Widowed    Spouse name: Not on file  . Number of children: 4  . Years of education: Not on file  . Highest education  level: Not on file  Occupational History  . Not on file  Tobacco Use  . Smoking status: Former Smoker    Packs/day: 1.00    Years: 5.00    Pack years: 5.00    Quit date: 11/14/1958    Years since quitting: 61.5  . Smokeless tobacco: Never Used  Vaping Use  . Vaping Use: Never used  Substance and Sexual Activity  . Alcohol use: No    Alcohol/week: 0.0 standard drinks  . Drug use: No  . Sexual activity: Not Currently    Birth control/protection: Post-menopausal    Comment: menarche age 36, P34, first birth age 31, no HRT, menopause age 12  Other Topics Concern  . Not on file  Social History Narrative  . Not on file   Social Determinants of Health   Financial Resource Strain: Low Risk   . Difficulty of Paying Living Expenses: Not very hard  Food Insecurity: Not on file  Transportation Needs: Not on file  Physical Activity: Not on file  Stress: Not on file  Social Connections: Not on file  Intimate  Partner Violence: Not on file      Review of Systems  All other systems reviewed and are negative.      Objective:   Physical Exam Vitals reviewed.  Constitutional:      General: She is not in acute distress.    Appearance: Normal appearance. She is not ill-appearing or toxic-appearing.  HENT:     Right Ear: Tympanic membrane and ear canal normal.     Left Ear: Tympanic membrane and ear canal normal.     Nose: No congestion or rhinorrhea.     Mouth/Throat:     Pharynx: No oropharyngeal exudate or posterior oropharyngeal erythema.  Cardiovascular:     Rate and Rhythm: Normal rate. Rhythm irregular.     Heart sounds: Normal heart sounds.  Pulmonary:     Effort: Pulmonary effort is normal. No respiratory distress.     Breath sounds: Normal breath sounds. No wheezing, rhonchi or rales.  Chest:     Chest wall: No tenderness.  Abdominal:     General: Abdomen is flat. Bowel sounds are normal. There is no distension.     Palpations: Abdomen is soft.     Tenderness: There is no abdominal tenderness. There is no guarding or rebound.  Musculoskeletal:     Right lower leg: Edema present.     Left lower leg: Edema present.  Lymphadenopathy:     Cervical: No cervical adenopathy.  Neurological:     Mental Status: She is alert.           Assessment & Plan:  Dry mouth  I believe a lot of this is due to age and medication.  However as explained to the patient I am hesitant to take her off the diuretic given the fact that she still has significant edema in both legs as well as a large right-sided pleural effusion.  I fear that if we discontinue the furosemide or decrease the dose, the patient would develop pulmonary edema and likely be hospitalized.  Therefore I recommended symptomatic therapy using hard candy or Biotene to try to deal with the dry mouth rather than discontinue her diuretic.  I see no evidence of any dehydration on exam today.

## 2020-05-16 ENCOUNTER — Telehealth: Payer: Self-pay | Admitting: Pulmonary Disease

## 2020-05-16 NOTE — Telephone Encounter (Signed)
Spoke with patient's granddaughter Whitney [name]. We went over the AVS from last week in regards to the blood thinner. She verbalized understanding.   Nothing further needed at time of call.

## 2020-05-17 ENCOUNTER — Other Ambulatory Visit: Payer: Self-pay

## 2020-05-17 ENCOUNTER — Ambulatory Visit: Payer: Medicare Other | Admitting: Gastroenterology

## 2020-05-17 ENCOUNTER — Encounter: Payer: Self-pay | Admitting: Gastroenterology

## 2020-05-17 VITALS — BP 120/76 | HR 91

## 2020-05-17 DIAGNOSIS — K529 Noninfective gastroenteritis and colitis, unspecified: Secondary | ICD-10-CM

## 2020-05-17 DIAGNOSIS — R933 Abnormal findings on diagnostic imaging of other parts of digestive tract: Secondary | ICD-10-CM | POA: Diagnosis not present

## 2020-05-17 DIAGNOSIS — K5790 Diverticulosis of intestine, part unspecified, without perforation or abscess without bleeding: Secondary | ICD-10-CM | POA: Diagnosis not present

## 2020-05-17 DIAGNOSIS — Z86010 Personal history of colonic polyps: Secondary | ICD-10-CM | POA: Diagnosis not present

## 2020-05-17 DIAGNOSIS — C184 Malignant neoplasm of transverse colon: Secondary | ICD-10-CM | POA: Diagnosis not present

## 2020-05-17 DIAGNOSIS — K56699 Other intestinal obstruction unspecified as to partial versus complete obstruction: Secondary | ICD-10-CM

## 2020-05-17 DIAGNOSIS — C189 Malignant neoplasm of colon, unspecified: Secondary | ICD-10-CM

## 2020-05-17 NOTE — Patient Instructions (Signed)
If you are age 85 or older, your body mass index should be between 23-30. Your There is no height or weight on file to calculate BMI. If this is out of the aforementioned range listed, please consider follow up with your Primary Care Provider.  If you are age 41 or younger, your body mass index should be between 19-25. Your There is no height or weight on file to calculate BMI. If this is out of the aformentioned range listed, please consider follow up with your Primary Care Provider.     Thank you for choosing me and [facility].  [name]

## 2020-05-20 ENCOUNTER — Ambulatory Visit: Payer: Medicare Other | Admitting: Pulmonary Disease

## 2020-05-20 ENCOUNTER — Other Ambulatory Visit (HOSPITAL_COMMUNITY)
Admission: RE | Admit: 2020-05-20 | Discharge: 2020-05-20 | Disposition: A | Discharge status: Home / Self Care | Payer: Medicare Other | Source: Ambulatory Visit | Attending: Pulmonary Disease | Admitting: Pulmonary Disease

## 2020-05-20 ENCOUNTER — Encounter: Payer: Self-pay | Admitting: Pulmonary Disease

## 2020-05-20 ENCOUNTER — Other Ambulatory Visit: Payer: Self-pay

## 2020-05-20 ENCOUNTER — Ambulatory Visit (INDEPENDENT_AMBULATORY_CARE_PROVIDER_SITE_OTHER): Payer: Medicare Other

## 2020-05-20 VITALS — BP 130/80 | HR 94 | Ht 69.0 in | Wt 156.0 lb

## 2020-05-20 DIAGNOSIS — J90 Pleural effusion, not elsewhere classified: Secondary | ICD-10-CM

## 2020-05-20 DIAGNOSIS — C189 Malignant neoplasm of colon, unspecified: Secondary | ICD-10-CM | POA: Insufficient documentation

## 2020-05-20 DIAGNOSIS — J9811 Atelectasis: Secondary | ICD-10-CM | POA: Diagnosis not present

## 2020-05-20 DIAGNOSIS — J948 Other specified pleural conditions: Secondary | ICD-10-CM | POA: Diagnosis not present

## 2020-05-20 LAB — GLUCOSE, PLEURAL OR PERITONEAL FLUID: Glucose, Fluid: 141 mg/dL

## 2020-05-20 LAB — BODY FLUID CELL COUNT WITH DIFFERENTIAL
Eos, Fluid: 0 %
Lymphs, Fluid: 81 %
Monocyte-Macrophage-Serous Fluid: 6 % — ABNORMAL LOW (ref 50–90)
Neutrophil Count, Fluid: 12 % (ref 0–25)
Other Cells, Fluid: 1 %
Total Nucleated Cell Count, Fluid: 121 cu mm (ref 0–1000)

## 2020-05-20 LAB — ALBUMIN, PLEURAL OR PERITONEAL FLUID: Albumin, Fluid: 1.4 g/dL

## 2020-05-20 LAB — LACTATE DEHYDROGENASE, PLEURAL OR PERITONEAL FLUID: LD, Fluid: 55 U/L — ABNORMAL HIGH (ref 3–23)

## 2020-05-20 LAB — PROTEIN, PLEURAL OR PERITONEAL FLUID: Total protein, fluid: <3 g/dL

## 2020-05-20 NOTE — Patient Instructions (Signed)
Glad you are doing well after the procedure We have removed most of the fluid I will get in touch with you once we get the results Follow back in clinic in 1 to 2 months.

## 2020-05-20 NOTE — Progress Notes (Signed)
Patient ID: Whitney [name], female   DOB: 10/11/1927, 87 y.o.   MRN: [id number]  Thoracentesis Procedure Note  Pre-operative Diagnosis: Right pleural effusion  Post-operative Diagnosis: same  Indications: Right pleural effusion  Procedure Details  Consent: Informed consent was obtained. Risks of the procedure were discussed including: infection, bleeding, pain, pneumothorax.  Under sterile conditions the patient was positioned. Betadine solution and sterile drapes were utilized.  1% buffered lidocaine was used to anesthetize the 8 rib space. Fluid was obtained without any difficulties and minimal blood loss.  A dressing was applied to the wound and wound care instructions were provided.   Findings 1600 ml of straw colored pleural fluid was obtained. A sample was sent to Pathology for cytogenetics, flow, and cell counts, as well as for infection analysis.  Complications:  None; patient tolerated the procedure well.          Condition: stable  Plan A follow up chest x-ray was ordered.  Marshell Garfinkel MD [facility] See Amion for pager  If no response to pager , please call [phone] until 7pm After 7:00 pm call Elink  [phone] 05/20/2020, 2:53 PM

## 2020-05-20 NOTE — Progress Notes (Signed)
Whitney [name]    [id number]    1927/12/26  Primary Care Physician:Pickard, Cammie Mcgee, MD  Referring Physician: [name], MD [address] [place],  [place] [address]  Chief complaint: Consult for right pleural effusion  HPI: 87 year old with history of breast cancer, hypertension, diabetes, atrial fibrillation, she had a recent colonoscopy by [name] on 03/11/2020 with findings of high-grade tubular adenoma Follow-up CT chest revealed a large right pleural effusion she has been referred to [facility] for further evaluation  History notable for right breast cancer with in 2015 which was treated with lumpectomy, chemo, radiation.  She is currently on tamoxifen under the care of Dr. Lindi Adie.  She feels well with no complaints of cough, dyspnea, hemoptysis, loss of weight, loss of appetite  Pets: No pets Occupation: Retired Systems analyst Exposures: No exposures.  No mold, hot tub, Jacuzzi.  No feather pillows or comforters Smoking history: Quit smoking in the 1950s Travel history: No significant travel history Relevant family history: No family history of lung disease  Interim history: She is here for thoracentesis.  No new complaints Dyspnea is stable. She has been holding Eliquis since 3/5  Outpatient Encounter Medications as of 05/20/2020  Medication Sig  . amiodarone (PACERONE) 200 MG tablet Take 1 tablet (200 mg total) by mouth daily.  Marland Kitchen apixaban (ELIQUIS) 2.5 MG TABS tablet Take 1 tablet (2.5 mg total) by mouth 2 (two) times daily. Provider has put Patient on 2.5 mg twice daily.  Marland Kitchen atorvastatin (LIPITOR) 10 MG tablet Take 1 tablet (10 mg total) by mouth daily.  . benzonatate (TESSALON) 200 MG capsule Take 1 capsule (200 mg total) by mouth 3 (three) times daily as needed for cough.  . Blood Glucose Monitoring Suppl (ONETOUCH VERIO) w/Device KIT Use to test blood sugars daily.  . cholecalciferol (VITAMIN D3) 25 MCG (1000 UNIT) tablet TAKE 1 TAB BY  MOUTH EVERY DAY  . docusate sodium (COLACE) 100 MG capsule Take 100 mg by mouth daily.  Marland Kitchen doxazosin (CARDURA) 4 MG tablet Take 0.5 tablets (2 mg total) by mouth daily.  . ferrous sulfate 324 (65 Fe) MG TBEC TAKE 1 TABLET BY MOUTH IN THE MORNING AND 1 AT BEDTIME  . furosemide (LASIX) 40 MG tablet Take 2 tablets (80 mg total) by mouth daily.  Marland Kitchen KLOR-CON M20 20 MEQ tablet TAKE 1 TABLET BY MOUTH EVERY DAY  . meclizine (ANTIVERT) 12.5 MG tablet TAKE 1 TABLET BY MOUTH DAILY AS NEEDED FOR DIZZINESS  . metoprolol succinate (TOPROL-XL) 100 MG 24 hr tablet TAKE 1 TABLET BY MOUTH TWICE A DAY WITH OR IMMEDIATELY FOLLOWING A MEAL  . ONETOUCH VERIO test strip CHECK BLOOD SUGAR EVERY DAY AS DIRECTED  . polyethylene glycol (MIRALAX / GLYCOLAX) 17 g packet Take 17 g by mouth daily. Also available OTC  . sitaGLIPtin (JANUVIA) 100 MG tablet Take 0.5 tablets (50 mg total) by mouth daily. Take 1/2 tablet (76m total) by mouth once daily. (Patient taking differently: Take 50 mg by mouth daily.)  . tamoxifen (NOLVADEX) 20 MG tablet TAKE 1 TABLET BY MOUTH EVERY DAY   Facility-Administered Encounter Medications as of 05/20/2020  Medication  . denosumab (PROLIA) injection 60 mg    Physical Exam: Gen:      No acute distress HEENT:  EOMI, sclera anicteric Neck:     No masses; no thyromegaly Lungs:    Diminished breath sounds on the right CV:  Regular rate and rhythm; no murmurs Abd:      + bowel sounds; soft, non-tender; no palpable masses, no distension Ext:    No edema; adequate peripheral perfusion Skin:      Warm and dry; no rash Neuro: alert and oriented x 3 Psych: normal mood and affect  Data Reviewed: Imaging: CT abdomen pelvis 01/01/2016-trace right effusion  CT abdomen pelvis 01/12/2020-moderate right effusion with compressive atelectasis  CT abdomen pelvis 04/29/2020-large right effusion with collapse/consolidation of the right lower and right middle lobe I have reviewed the images  personally  PFTs:  Labs: SARS-CoV-2 test 10/24/2019, 01/13/2020, 03/07/2020-negative  Assessment:  Right effusion This has been present at least since November 2021 but it was noted on CT abdomen pelvis and is getting bigger.  She does not appear infected with no clinical evidence of pneumonia.  Concern is for malignant effusion given recent findings of high-grade tubular adenoma and history of breast cancer Risk/benefit of thoracentesis explained to daughter and they have agreed to proceed  Plan/Recommendations: Thoracentesis Resume Eliquis tonight    MD [facility] 05/20/2020, 2:12 PM  CC: Pickard, [name], MD   

## 2020-05-21 ENCOUNTER — Encounter: Payer: Self-pay | Admitting: Gastroenterology

## 2020-05-21 DIAGNOSIS — C184 Malignant neoplasm of transverse colon: Secondary | ICD-10-CM | POA: Insufficient documentation

## 2020-05-21 DIAGNOSIS — K529 Noninfective gastroenteritis and colitis, unspecified: Secondary | ICD-10-CM | POA: Insufficient documentation

## 2020-05-21 LAB — CYTOLOGY - NON PAP

## 2020-05-21 LAB — PATHOLOGIST SMEAR REVIEW

## 2020-05-21 LAB — AMYLASE, PLEURAL OR PERITONEAL FLUID: Amylase, Fluid: 24 U/L

## 2020-05-21 NOTE — Progress Notes (Signed)
[facility] VISIT   Primary Care Provider [name], [name] [phone]  Patient Profile: Whitney [name] is a 87 y.o. female with a pmh significant for atrial fibrillation (on anticoagulation), prior breast cancer, hypertension, hyperlipidemia, hypothyroidism, anemia, severe diverticulosis, colon polyps, transverse colon polyp with high-grade dysplasia concerning for potential focus of adenocarcinoma.  The patient presents to the [name] for an evaluation and management of problem(s) noted below:  Problem List 1. 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 Chronic colitis   3. Hx of adenomatous colonic polyps   4. Stenosis colon ([place])   5. Diverticulosis   6. Abnormal colonoscopy     History of Present Illness Please see initial consultation notes from her hospitalization as well as my outpatient notes for full details of HPI.  Interval History Today, the patient returns with her granddaughter for follow-up.  At the time of her last colonoscopy we removed the polypoid lesion in a piecemeal fashion.  Pathology came back showing evidence of multifocal high-grade dysplasia and foci suspicious for intramucosal cancer though nondiagnostic.  Cross-sectional imaging has not shown any evidence of a mass or lesion or any lymph nodes.  The patient states that she has had looser bowel movements at times which have been ongoing for a while but she is not noting any blood in her stools.  She is not sure herself if she wants to continue undergoing colonoscopies.  Her granddaughter also expresses concern about undergoing repeated procedures with anesthesia and the higher risk nature of the procedure based on the known narrowing that she has had in the left colon.  Energy levels are okay.  The patient is undergoing a thoracentesis  next week for a large right-sided pleural effusion to evaluate and ensure that there is no evidence of cytological abnormality concerning for cancer.  Patient denies any significant shortness of breath.  Weight has been stable.  GI Review of Systems Positive as above Negative for pyrosis, pain, dysphagia, nausea, vomiting, melena, hematochezia  Review of Systems General: Denies fevers/chills/weight loss unintentionally Cardiovascular: Denies chest pain/palpitations Pulmonary: Denies shortness of breath Gastroenterological: See HPI Genitourinary: Denies darkened urine or hematuria Hematological: Positive for easy bruising/bleeding due to anticoagulation Dermatological: Denies jaundice Psychological: Mood is stable   Medications Current Outpatient Medications  Medication Sig Dispense Refill  . amiodarone (PACERONE) 200 MG tablet Take 1 tablet (200 mg total) by mouth daily. 90 tablet 3  . apixaban (ELIQUIS) 2.5 MG TABS tablet Take 1 tablet (2.5 mg total) by mouth 2 (two) times daily. Provider has put Patient on 2.5 mg twice daily. 90 tablet 3  . atorvastatin (LIPITOR) 10 MG tablet Take 1 tablet (10 mg total) by mouth daily. 90 tablet 1  . benzonatate (TESSALON) 200 MG capsule Take 1 capsule (200 mg total) by mouth 3 (three) times daily as needed for cough. 20 capsule 0  . Blood Glucose Monitoring Suppl (ONETOUCH VERIO) w/Device KIT Use to test blood sugars daily. 1 kit 0  . cholecalciferol (VITAMIN D3) 25 MCG (1000 UNIT) tablet TAKE 1 TAB BY MOUTH EVERY DAY 100 tablet 1  . docusate sodium (COLACE) 100 MG capsule Take 100 mg by mouth daily.    Marland Kitchen doxazosin (CARDURA) 4 MG tablet Take 0.5 tablets (2 mg total) by mouth daily.    . ferrous sulfate 324 (65 Fe) MG TBEC TAKE 1 TABLET BY  MOUTH IN THE MORNING AND 1 AT BEDTIME 180 tablet 1  . furosemide (LASIX) 40 MG tablet Take 2 tablets (80 mg total) by mouth daily. 90 tablet 2  . KLOR-CON M20 20 MEQ tablet TAKE 1 TABLET BY MOUTH EVERY DAY 90 tablet  1  . meclizine (ANTIVERT) 12.5 MG tablet TAKE 1 TABLET BY MOUTH DAILY AS NEEDED FOR DIZZINESS 30 tablet 2  . metoprolol succinate (TOPROL-XL) 100 MG 24 hr tablet TAKE 1 TABLET BY MOUTH TWICE A DAY WITH OR IMMEDIATELY FOLLOWING A MEAL 180 tablet 1  . ONETOUCH VERIO test strip CHECK BLOOD SUGAR EVERY DAY AS DIRECTED 100 strip 2  . polyethylene glycol (MIRALAX / GLYCOLAX) 17 g packet Take 17 g by mouth daily. Also available OTC 30 each 0  . sitaGLIPtin (JANUVIA) 100 MG tablet Take 0.5 tablets (50 mg total) by mouth daily. Take 1/2 tablet (61m total) by mouth once daily. (Patient taking differently: Take 50 mg by mouth daily.) 90 tablet 1  . tamoxifen (NOLVADEX) 20 MG tablet TAKE 1 TABLET BY MOUTH EVERY DAY 90 tablet 3   Current Facility-Administered Medications  Medication Dose Route Frequency Provider Last Rate Last Admin  . denosumab (PROLIA) injection 60 mg  60 mg Subcutaneous Q6 months PSusy Frizzle MD   60 mg at 04/19/20 1135    Allergies Allergies  Allergen Reactions  . Tape Other (See Comments)    Skin is somewhat sensitive    Histories Past Medical History:  Diagnosis Date  . Anemia   . Atrial fibrillation ([place]   . Breast cancer ([place] 10/2013   right upper outer  . Cancer ([place]    right breast  . Complication of anesthesia    slow to wake up  . Diabetes mellitus without complication ([place]    type 2  . Dysrhythmia 10/15   AF  . Former smoker   . Full dentures   . Hyperlipidemia   . Hypertension   . Multinodular goiter   . Osteoporosis   . Radiation    Right Breast  . Thyroid disease    hypothyroidism  . Wears glasses    Past Surgical History:  Procedure Laterality Date  . ABDOMINAL HYSTERECTOMY    . AXILLARY LYMPH NODE DISSECTION Right 01/09/2014   Procedure: RIGHT AXILLARY LYMPH NODE DISECTION;  Surgeon: TErroll Luna MD;  [facility]  Service: General;  Laterality: Right;  . BIOPSY  10/26/2019   Procedure: BIOPSY;  Surgeon:  MRush LandmarkGTelford Nab, MD;  [facility];  Service: Gastroenterology;;  . BIOPSY  03/11/2020   Procedure: BIOPSY;  Surgeon: MIrving Copas, MD;  [facility] [facility]  Service: Gastroenterology;;  . BREAST LUMPECTOMY WITH RADIOACTIVE SEED LOCALIZATION Right 01/09/2014   Procedure: RIGHT BREAST SEED LOCALIZED LUMPECTOMY ;  Surgeon: TErroll Luna MD;  [facility]  Service: General;  Laterality: Right;  . COLONOSCOPY WITH PROPOFOL N/A 10/26/2019   Procedure: COLONOSCOPY WITH PROPOFOL;  Surgeon: MRush LandmarkGTelford Nab, MD;  [facility];  Service: Gastroenterology;  Laterality: N/A;  . COLONOSCOPY WITH PROPOFOL N/A 03/11/2020   Procedure: COLONOSCOPY WITH PROPOFOL- Ultraslim scope;  Surgeon: MIrving Copas, MD;  [facility]  Service: Gastroenterology;  Laterality: N/A;  . ENDOSCOPIC MUCOSAL RESECTION N/A 03/11/2020   Procedure: ENDOSCOPIC MUCOSAL RESECTION;  Surgeon: MRush LandmarkGTelford Nab, MD;  [facility]  Service: Gastroenterology;  Laterality: N/A;  . ESOPHAGOGASTRODUODENOSCOPY (EGD) WITH PROPOFOL N/A 10/26/2019   Procedure: ESOPHAGOGASTRODUODENOSCOPY (EGD) WITH PROPOFOL;  Surgeon: Irving Copas., MD;  [facility];  Service: Gastroenterology;  Laterality: N/A;  . EYE SURGERY  12/22/2009   cataracts  . HEMOSTASIS CLIP PLACEMENT  10/26/2019   Procedure: HEMOSTASIS CLIP PLACEMENT;  Surgeon: Irving Copas., MD;  [facility];  Service: Gastroenterology;;  marking for transverse lesion  . INTRAMEDULLARY (IM) NAIL INTERTROCHANTERIC Left 02/24/2014   Procedure: IM ROD LEFT HIP FX;  Surgeon: Alta [place], MD;  Location: [facility];  Service: Orthopedics;  Laterality: Left;  . left distal radius fracture     2020  . POLYPECTOMY  10/26/2019   Procedure: POLYPECTOMY;  Surgeon: Mansouraty, Telford Nab., MD;  [facility];  Service: Gastroenterology;;  . POLYPECTOMY   03/11/2020   Procedure: POLYPECTOMY;  Surgeon: Irving Copas., MD;  Location: [facility];  Service: Gastroenterology;;  . PORT-A-CATH REMOVAL Right 01/09/2014   Procedure: REMOVAL PORT-A-CATH;  Surgeon: [name], MD;  Location: [facility];  Service: General;  Laterality: Right;  . PORTACATH PLACEMENT N/A 11/15/2013   Procedure: INSERTION PORT-A-CATH WITH ULTRA SOUND AND FLOROSCOPY;  Surgeon: [name], MD;  Location: [facility];  Service: General;  Laterality: N/A;  . SUBMUCOSAL TATTOO INJECTION  10/26/2019   Procedure: SUBMUCOSAL TATTOO INJECTION;  Surgeon: Irving Copas., MD;  [facility];  Service: Gastroenterology;;  . TOTAL KNEE ARTHROPLASTY  2002   left   Social History   Socioeconomic History  . Marital status: Widowed    Spouse name: Not on file  . Number of children: 4  . Years of education: Not on file  . Highest education level: Not on file  Occupational History  . Not on file  Tobacco Use  . Smoking status: Former Smoker    Packs/day: 1.00    Years: 5.00    Pack years: 5.00    Quit date: 11/14/1958    Years since quitting: 61.5  . Smokeless tobacco: Never Used  Vaping Use  . Vaping Use: Never used  Substance and Sexual Activity  . Alcohol use: No    Alcohol/week: 0.0 standard drinks  . Drug use: No  . Sexual activity: Not Currently    Birth control/protection: Post-menopausal    Comment: menarche age 50, P11, first birth age 76, no HRT, menopause age 27  Other Topics Concern  . Not on file  Social History Narrative  . Not on file   Social Determinants of Health   Financial Resource Strain: Low Risk   . Difficulty of Paying Living Expenses: Not very hard  Food Insecurity: Not on file  Transportation Needs: Not on file  Physical Activity: Not on file  Stress: Not on file  Social Connections: Not on file  Intimate Partner Violence: Not on file   Family History  Problem Relation Age of Onset  . Colon cancer  Neg Hx   . Esophageal cancer Neg Hx   . Inflammatory bowel disease Neg Hx   . Liver disease Neg Hx   . Pancreatic cancer Neg Hx   . Rectal cancer Neg Hx   . Stomach cancer Neg Hx    I have reviewed her medical, social, and family history in detail and updated the electronic medical record as necessary.    PHYSICAL EXAMINATION  BP 120/76   Pulse 91   LMP  (LMP Unknown)  GEN: NAD, appears stated age, doesn't appear chronically ill, accompanied by granddaughter PSYCH: Cooperative, without pressured speech EYE: Conjunctivae pink, sclerae anicteric ENT: Masked CV: Nontachycardic RESP: No  audible wheezing GI: NABS, soft, NT/ND, without rebound or guarding, no HSM appreciated MSK/EXT: Bilateral lower extremity edema present SKIN: No jaundices NEURO:  Alert & Oriented x 3, no focal deficits   REVIEW OF DATA  I reviewed the following data at the time of this encounter:  GI Procedures and Studies  December 2021 colonoscopy - Hemorrhoids found on digital rectal exam. - Stool in the entire examined colon - lavaged copiously with adequate visualization. - One 8 mm polyp in the transverse colon, removed with a cold snare. Resected and retrieved. - One 40 mm polypoid lesion (previously biopsied as only HGD) with JNET2a/JNET2b and some JNET3 visualization was noted in the proximal transverse colon (contralateral to previously placed tattoo), removed in piecemeal mucosal resection fashion after lift performed of majority of the lesion. Resected and retrieved. Clips (MR conditional) were placed to try to close defect and decrease risk of bleeding. - Congested mucosa in the sigmoid colon. Biopsied to rule out chronic colitis. - Narrowing/stenosis in the recto-sigmoid colon with proximal dilation of the descending colon and at the splenic flexure. - Diverticulosis in the rectum, in the recto-sigmoid colon, in the sigmoid colon and in the descending colon. - Non-bleeding non-thrombosed external and  internal hemorrhoids.  Pathology FINAL MICROSCOPIC DIAGNOSIS:  A. COLON, TRANSVERSE, POLYPECTOMY:  - Tubular adenoma without high grade dysplasia.  B. COLON, TRANSVERSE, ENDOSCOPIC MUCOSAL RESECTION OF POLYP:  - Tubular adenoma with multifocal high grade dysplasia and microscopic  foci suspicious for intramucosal adenocarcinoma.  C. COLON, RECTOSIGMOID, BIOPSY:  - Mildly active chronic nonspecific colitis.  COMMENT:  B. The specimen was received as multiple fragments precluding accurate  assessment of the margin.  C. Chronicity is manifested mostly by lamina propria and basal  lymphoplasmacytosis, with mostly preserved crypt architecture.  Diagnostic features of inflammatory bowel disease are not observed in  the submitted material. The differential diagnosis includes infection,  drug-effect, stercoral proctitis and inflammatory bowel disease. Please  correlate clinically.   Laboratory Studies  Reviewed those in epic  Imaging Studies  No relevant studies to review   ASSESSMENT  Ms. Boven is a 87 y.o. female with a pmh significant for atrial fibrillation (on anticoagulation), prior breast cancer, hypertension, hyperlipidemia, hypothyroidism, anemia, severe diverticulosis, colon polyps, transverse colon polyp with high-grade dysplasia concerning for potential focus of adenocarcinoma.  The patient is seen today for evaluation and management of:  1. Tubular adenoma with multifocal HGD and foci 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 ([place])   2. Chronic colitis   3. Hx of adenomatous colonic polyps   4. Stenosis colon ([place])   5. Diverticulosis   6. Abnormal colonoscopy    The patient remains clinically and hemodynamically stable.  I had an extensive and long discussion with the patient as well as her granddaughter today.  It is not clear that they want to move forward with high risk surveillance of the polypoid mass/lesion that we removed from the transverse  colon in piecemeal fashion.  I expressed my concern with the findings of multifocal high-grade dysplasia and foci concerning for intramucosal cancer that an underlying persistent cancer process could still be present and if we do not relook that we could miss this.  However, both of them are not wanting to move forward with endoscopic reevaluation at this time due to the patient's age of being 23 as well as her multiple medical comorbidities including having to come off anticoagulation and the higher risk nature because of the colonic narrowing as result  of the left-sided stenosis noted on her procedures.  It is not clear to me that her looser bowel movements that are occurring at different times are necessarily a result of this "chronic colitis" that has been found but we certainly can consider trial of mesalamine therapy at some point in the future if this becomes more persistent.  Again, my biggest concern is that if we do not relook that we have the possibility of missing a process that could develop more significantly into a cancer.  The patient and granddaughter are not interested in surgery at her age either.  They are comfortable with Dr. Dennard Schaumann and myself monitoring the patient and deciding based on laboratories if things change to consider repeat evaluation and do not want an oncology referral at this time.  Although, this would not be my recommendation to stop high risk surveillance, certainly anytime I put in the camera into the colon we have a risk and I could cause more problems if she did have a perforation.  So I will except the patient and family desires of not repeating endoscopic evaluation unless something else changes drastically at which point we probably would repeat imaging first.  I will update the patient's primary care doctor as well to her decision and we will wait to see what comes of the thoracentesis cytology next week.  All patient questions were answered to the best of my ability,  and the patient agrees to the aforementioned plan of action with follow-up as indicated.   PLAN  Based on extensive discussion with the patient and granddaughter today they have both deferred on further endoscopic surveillance due to high risk nature with the patient's medical comorbidities and age being the highest factors for their reasoning Reasonable to periodically check CBC and iron indices and will defer that to patient's primary care doctor as patient and granddaughter have difficulty getting to clinic visits and want to hold on return for now to the GI clinic If the patient starts developing more frequent and persistent loose bowel movements may consider mesalamine therapy in an effort of trying to treat the "chronic colitis" that was found on 2 prior colonoscopy reports in the left side of the colon No oncology referral per patient and family request at this time   No orders of the defined types were placed in this encounter.   New Prescriptions   No medications on file   Modified Medications   No medications on file    Planned Follow Up No follow-ups on file.   Total Time in Face-to-Face and in Coordination of Care for patient including independent/personal interpretation/review of prior testing, medical history, examination, medication adjustment, communicating results with the patient directly, and documentation with the EHR is 35 minutes.   Justice Britain, MD [facility] Office # [phone]

## 2020-05-22 ENCOUNTER — Encounter: Payer: Self-pay | Admitting: Gastroenterology

## 2020-05-23 LAB — BODY FLUID CULTURE W GRAM STAIN: Culture: NO GROWTH

## 2020-05-23 NOTE — Progress Notes (Signed)
Patient Care Team: [name], MD as PCP - General (Family Medicine) Nicholas Lose, MD as Consulting Physician (Hematology and Oncology) [name], MD as Consulting Physician (General Surgery) Edythe Clarity, [facility] as Pharmacist (Pharmacist)  DIAGNOSIS:    ICD-10-CM   1. Malignant neoplasm of upper-outer quadrant of right breast in female, estrogen receptor positive ([place])  C50.411    Z17.0     SUMMARY OF ONCOLOGIC HISTORY: Oncology History  Breast cancer of upper-outer quadrant of right female breast ([place])  10/20/2013 Mammogram   Ultrasound and mammogram showed 2.1 x 2.1 x 1.9 cm right breast mass   10/20/2013 Initial Diagnosis   Breast cancer of upper-outer quadrant of right female breast. Invasive ductal cancer with lymphovascular invasion one lymph node biopsy that was positive for cancer grade 1; Her 2 Neg Ratio 0.96, ER100% PR 8% positive Ki 67: 11%;   10/31/2013 Breast MRI   Right breast upper outer quadrant: 3.1 x 1.9 x 2.2 cm: Right axillary lymph node 1.1 x 0.9 x 0.6 cm   11/16/2013 - 12/01/2013 Chemotherapy   Neoadjuvant dose dense Doxorubicin and Cyclophosphamide given on day 1 of a 14 day cycle with Neulasta given on day 2 for granulocyte support   01/09/2014 Surgery   Right breast lumpectomy: Invasive ductal carcinoma, grade 2, 1.8 cm with DCIS intermediate grade, lymphovascular invasion diffusely, 5 of 13 lymph nodes positive with extracapsular extension, ER positive, PR 80%, HER-2 negative ratio 0.96, Ki-67 11%   02/25/2014 - 02/28/2014 Hospital Admission   Left hip fracture as a result of a fall at home   05/15/2014 - 06/29/2014 Radiation Therapy   Adjuvant radiation therapy with Dr. Pablo Ledger   07/20/2014 -  Anti-estrogen oral therapy   Tamoxifen 20 mg daily     CHIEF COMPLIANT: Follow-up of left breast cancer on tamoxifen therapy  INTERVAL HISTORY: Whitney [name] is a 87 y.o. with above-mentioned history of left breast cancer currently on  antiestrogen therapy with tamoxifen. Mammogram on 11/13/19 showed no evidence of malignancy bilaterally.She presents to the clinic todayfor annual follow-up.    She is in a wheelchair today.  She does not complain of any current shortness of breath.  She had a large pleural effusion underwent 1.5 L thoracentesis.  Pathology revealed reactive mesenchymal cells.  After the thoracentesis for the large pleural effusion on the right, she has felt reasonably well.  Denies any chest pain cough shortness of breath.  ALLERGIES:  is allergic to tape.  MEDICATIONS:  Current Outpatient Medications  Medication Sig Dispense Refill  . amiodarone (PACERONE) 200 MG tablet Take 1 tablet (200 mg total) by mouth daily. 90 tablet 3  . apixaban (ELIQUIS) 2.5 MG TABS tablet Take 1 tablet (2.5 mg total) by mouth 2 (two) times daily. Provider has put Patient on 2.5 mg twice daily. 90 tablet 3  . atorvastatin (LIPITOR) 10 MG tablet Take 1 tablet (10 mg total) by mouth daily. 90 tablet 1  . benzonatate (TESSALON) 200 MG capsule Take 1 capsule (200 mg total) by mouth 3 (three) times daily as needed for cough. 20 capsule 0  . Blood Glucose Monitoring Suppl (ONETOUCH VERIO) w/Device KIT Use to test blood sugars daily. 1 kit 0  . cholecalciferol (VITAMIN D3) 25 MCG (1000 UNIT) tablet TAKE 1 TAB BY MOUTH EVERY DAY 100 tablet 1  . docusate sodium (COLACE) 100 MG capsule Take 100 mg by mouth daily.    Marland Kitchen doxazosin (CARDURA) 4 MG tablet Take 0.5 tablets (2 mg  total) by mouth daily.    . ferrous sulfate 324 (65 Fe) MG TBEC TAKE 1 TABLET BY MOUTH IN THE MORNING AND 1 AT BEDTIME 180 tablet 1  . furosemide (LASIX) 40 MG tablet Take 2 tablets (80 mg total) by mouth daily. 90 tablet 2  . KLOR-CON M20 20 MEQ tablet TAKE 1 TABLET BY MOUTH EVERY DAY 90 tablet 1  . meclizine (ANTIVERT) 12.5 MG tablet TAKE 1 TABLET BY MOUTH DAILY AS NEEDED FOR DIZZINESS 30 tablet 2  . metoprolol succinate (TOPROL-XL) 100 MG 24 hr tablet TAKE 1 TABLET BY MOUTH  TWICE A DAY WITH OR IMMEDIATELY FOLLOWING A MEAL 180 tablet 1  . ONETOUCH VERIO test strip CHECK BLOOD SUGAR EVERY DAY AS DIRECTED 100 strip 2  . polyethylene glycol (MIRALAX / GLYCOLAX) 17 g packet Take 17 g by mouth daily. Also available OTC 30 each 0  . sitaGLIPtin (JANUVIA) 100 MG tablet Take 0.5 tablets (50 mg total) by mouth daily. Take 1/2 tablet (28m total) by mouth once daily. (Patient taking differently: Take 50 mg by mouth daily.) 90 tablet 1  . tamoxifen (NOLVADEX) 20 MG tablet TAKE 1 TABLET BY MOUTH EVERY DAY 90 tablet 3   Current Facility-Administered Medications  Medication Dose Route Frequency Provider Last Rate Last Admin  . denosumab (PROLIA) injection 60 mg  60 mg Subcutaneous Q6 months PSusy Frizzle MD   60 mg at 04/19/20 1135    PHYSICAL EXAMINATION: ECOG PERFORMANCE STATUS: 1 - Symptomatic but completely ambulatory  Vitals:   05/24/20 1037  BP: (!) 148/82  Pulse: 90  Resp: 18  Temp: (!) 97.5 F (36.4 C)  SpO2: 100%   Filed Weights    Lungs: Crackles at bilateral bases, small pleural effusion on the right  LABORATORY DATA:  I have reviewed the data as listed CMP Latest Ref Rng & Units 04/29/2020 01/29/2020 01/19/2020  Glucose 65 - 99 mg/dL - 201(H) -  BUN 7 - 25 mg/dL - 25 -  Creatinine 0.44 - 1.00 mg/dL 1.30(H) 1.22(H) -  Sodium 135 - 146 mmol/L - 141 -  Potassium 3.5 - 5.3 mmol/L - 4.2 -  Chloride 98 - 110 mmol/L - 104 -  CO2 20 - 32 mmol/L - 27 -  Calcium 8.6 - 10.4 mg/dL - 8.9 -  Total Protein 6.1 - 8.1 g/dL - 5.9(L) 5.7(L)  Total Bilirubin 0.2 - 1.2 mg/dL - 0.6 0.5  Alkaline Phos 39 - 117 U/L - - 31(L)  AST 10 - 35 U/L - 41(H) 40(H)  ALT 6 - 29 U/L - 29 29    Lab Results  Component Value Date   WBC 3.9 01/29/2020   HGB 10.3 (L) 01/29/2020   HCT 30.9 (L) 01/29/2020   MCV 102.0 (H) 01/29/2020   PLT 159 01/29/2020   NEUTROABS 3,019 01/29/2020    ASSESSMENT & PLAN:  Breast cancer of upper-outer quadrant of right female breast  ([place] T1 CN2A M0 stage IIIa invasive ductal carcinoma right breast status post lumpectomy 5/13 lymph nodes positive with lymphovascular invasion and extracapsular extension. ER positive PR positive HER-2 negative Ki-67 11%. Patient was hospitalized for left hip fracture 02/25/2014, completed adjuvant radiation therapy 06/29/14, Started Tamoxifen 07/20/14 Hospitalization 12/30/2015: Escherichia colibacteremia.  Tamoxifen Toxicities:no toxicities to tamoxifen. Denies any hot flashes or myalgias. Plan is to treat her for 10 years  Breast Cancer Surveillance: 1. Breast exam3/01/2021: Benign.  Slight lymphedema of the right breast 2. Mammogram8/31/2021: Benign breast density category B  Mild chronic kidney  disease: This is being monitored by her primary care physician. CT chest 04/29/2020: Large right pleural effusion with collapse and consolidation of right lower and right middle lobes.  Heterogeneous left adrenal mass stable 05/20/2020: Thoracentesis 1500 cc removed: Reactive mesothelial cells Since there is no evidence of malignancy in the pleural effusion, no change in treatment is recommended.  Return to clinic on an as-needed basis.    No orders of the defined types were placed in this encounter.  The patient has a good understanding of the overall plan. she agrees with it. she will call with any problems that may develop before the next visit here.  Total time spent: 20 mins including face to face time and time spent for planning, charting and coordination of care  [name], MD, MPH 05/24/2020  I, Molly Dorshimer, am acting as scribe for Dr. Nicholas Lose.  I have reviewed the above documentation for accuracy and completeness, and I agree with the above.

## 2020-05-24 ENCOUNTER — Inpatient Hospital Stay: Payer: Medicare Other | Attending: Hematology and Oncology | Admitting: Hematology and Oncology

## 2020-05-24 ENCOUNTER — Other Ambulatory Visit: Payer: Self-pay

## 2020-05-24 DIAGNOSIS — Z9221 Personal history of antineoplastic chemotherapy: Secondary | ICD-10-CM | POA: Insufficient documentation

## 2020-05-24 DIAGNOSIS — Z79899 Other long term (current) drug therapy: Secondary | ICD-10-CM | POA: Diagnosis not present

## 2020-05-24 DIAGNOSIS — Z7901 Long term (current) use of anticoagulants: Secondary | ICD-10-CM | POA: Insufficient documentation

## 2020-05-24 DIAGNOSIS — N182 Chronic kidney disease, stage 2 (mild): Secondary | ICD-10-CM | POA: Diagnosis not present

## 2020-05-24 DIAGNOSIS — Z7984 Long term (current) use of oral hypoglycemic drugs: Secondary | ICD-10-CM | POA: Insufficient documentation

## 2020-05-24 DIAGNOSIS — C50411 Malignant neoplasm of upper-outer quadrant of right female breast: Secondary | ICD-10-CM | POA: Diagnosis not present

## 2020-05-24 DIAGNOSIS — Z79810 Long term (current) use of selective estrogen receptor modulators (SERMs): Secondary | ICD-10-CM | POA: Diagnosis not present

## 2020-05-24 DIAGNOSIS — Z170 Estrogen receptor positive status [ER+]: Secondary | ICD-10-CM | POA: Insufficient documentation

## 2020-05-24 DIAGNOSIS — E279 Disorder of adrenal gland, unspecified: Secondary | ICD-10-CM | POA: Diagnosis not present

## 2020-05-24 DIAGNOSIS — Z923 Personal history of irradiation: Secondary | ICD-10-CM | POA: Diagnosis not present

## 2020-05-24 LAB — TRIGLYCERIDES, BODY FLUIDS: Triglycerides, Fluid: 18 mg/dL

## 2020-05-24 NOTE — Assessment & Plan Note (Signed)
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 ER positive PR positive HER-2 negative Ki-67 11%. Patient was hospitalized for left hip fracture 02/25/2014, completed adjuvant radiation therapy 06/29/14, Started Tamoxifen 07/20/14 Hospitalization 12/30/2015: Escherichia colibacteremia.  Tamoxifen Toxicities:no toxicities to tamoxifen. Denies any hot flashes or myalgias. Plan is to treat her for 10 years  Breast Cancer Surveillance: 1. Breast exam3/01/2021: Benign.  Slight lymphedema of the right breast 2. Mammogram8/31/2021: Benign breast density category B  Mild chronic kidney disease: This is being monitored by her primary care physician. CT chest 04/29/2020: Large right pleural effusion with collapse and consolidation of right lower and right middle lobes.  Heterogeneous left adrenal mass stable 05/20/2020: Thoracentesis 1500 cc removed: Reactive mesothelial cells Since there is no evidence of malignancy in the pleural effusion, no change in treatment is recommended.  Return to clinic in 1 year for follow-up

## 2020-05-25 DIAGNOSIS — D649 Anemia, unspecified: Secondary | ICD-10-CM | POA: Diagnosis not present

## 2020-05-25 DIAGNOSIS — I509 Heart failure, unspecified: Secondary | ICD-10-CM | POA: Diagnosis not present

## 2020-05-25 DIAGNOSIS — M810 Age-related osteoporosis without current pathological fracture: Secondary | ICD-10-CM | POA: Diagnosis not present

## 2020-05-27 ENCOUNTER — Telehealth: Payer: Self-pay | Admitting: Hematology and Oncology

## 2020-05-27 NOTE — Telephone Encounter (Signed)
Checked out appointment. No LOS notes needing to be scheduled. No changes made. 

## 2020-05-29 ENCOUNTER — Ambulatory Visit: Payer: Medicare Other | Admitting: Pulmonary Disease

## 2020-06-02 LAB — CHOLESTEROL, BODY FLUID: Cholesterol, Fluid: 28 mg/dL

## 2020-06-03 ENCOUNTER — Other Ambulatory Visit: Payer: Self-pay | Admitting: Endocrinology

## 2020-06-03 ENCOUNTER — Other Ambulatory Visit: Payer: Self-pay | Admitting: Family Medicine

## 2020-06-04 ENCOUNTER — Ambulatory Visit: Payer: Medicare Other | Admitting: Family Medicine

## 2020-06-10 ENCOUNTER — Ambulatory Visit (INDEPENDENT_AMBULATORY_CARE_PROVIDER_SITE_OTHER): Payer: Medicare Other | Admitting: Family Medicine

## 2020-06-10 ENCOUNTER — Other Ambulatory Visit: Payer: Self-pay

## 2020-06-10 ENCOUNTER — Encounter: Payer: Self-pay | Admitting: Family Medicine

## 2020-06-10 ENCOUNTER — Ambulatory Visit (HOSPITAL_COMMUNITY)
Admission: RE | Admit: 2020-06-10 | Discharge: 2020-06-10 | Disposition: A | Discharge status: Home / Self Care | Payer: Medicare Other | Source: Ambulatory Visit | Attending: Family Medicine | Admitting: Family Medicine

## 2020-06-10 VITALS — BP 142/84 | HR 88 | Temp 98.1°F | Resp 16

## 2020-06-10 DIAGNOSIS — I5032 Chronic diastolic (congestive) heart failure: Secondary | ICD-10-CM | POA: Diagnosis not present

## 2020-06-10 DIAGNOSIS — R059 Cough, unspecified: Secondary | ICD-10-CM

## 2020-06-10 DIAGNOSIS — N1831 Chronic kidney disease, stage 3a: Secondary | ICD-10-CM

## 2020-06-10 DIAGNOSIS — J90 Pleural effusion, not elsewhere classified: Secondary | ICD-10-CM | POA: Diagnosis not present

## 2020-06-10 DIAGNOSIS — J9811 Atelectasis: Secondary | ICD-10-CM | POA: Diagnosis not present

## 2020-06-10 NOTE — Progress Notes (Signed)
Subjective:    Patient ID: Whitney [name], female    DOB: 1927/09/12, 87 y.o.   MRN: [id number]  Sore Throat     Patient had a colonoscopy December 27 which revealed a 40 mm large polyp that was biopsied.  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  Patient was given the option over a partial colectomy versus rechecking in 3 to 4 months with a colonoscopy to see if the lesion had been removed in its entirety.  On March 7, she had a thoracentesis performed due to a large right-sided pleural effusion.  She presents today reporting a nonproductive cough.  She states that she constantly feels congested in her chest.  She also believe that she has a lot of postnasal drip and drainage that may be causing the cough.  She just cannot get it up.  She denies any fevers or chills.  She denies any purulent sputum.  She denies any shortness of breath but she is sedentary.  She denies any hemoptysis.  However over the last week or so she has noticed significant edema in both legs.  She has +2 edema in both legs distal to the knee.  She is taking Lasix 40 mg, 2 tablets a day.  She denies any orthopnea but she does sleep in a recliner due to back pain Past Surgical History:  Procedure Laterality Date  . ABDOMINAL HYSTERECTOMY    . AXILLARY LYMPH NODE DISSECTION Right 01/09/2014   Procedure: RIGHT AXILLARY LYMPH NODE DISECTION;  Surgeon: [name], MD;  Location: [place];  Service: General;  Laterality: Right;  . BIOPSY  10/26/2019   Procedure: BIOPSY;  Surgeon: Rush Landmark Telford Nab., MD;  Location: [facility];  Service: Gastroenterology;;  . BIOPSY  03/11/2020   Procedure: BIOPSY;  Surgeon: Irving Copas., MD;  Location: [place];  Service: Gastroenterology;;  . BREAST LUMPECTOMY WITH RADIOACTIVE SEED LOCALIZATION Right 01/09/2014   Procedure: RIGHT BREAST SEED LOCALIZED LUMPECTOMY ;  Surgeon: [name], MD;  Location:  [place];  Service: General;  Laterality: Right;  . COLONOSCOPY WITH PROPOFOL N/A 10/26/2019   Procedure: COLONOSCOPY WITH PROPOFOL;  Surgeon: Rush Landmark Telford Nab., MD;  Location: [facility];  Service: Gastroenterology;  Laterality: N/A;  . COLONOSCOPY WITH PROPOFOL N/A 03/11/2020   Procedure: COLONOSCOPY WITH PROPOFOL- Ultraslim scope;  Surgeon: Irving Copas., MD;  Location: [place];  Service: Gastroenterology;  Laterality: N/A;  . ENDOSCOPIC MUCOSAL RESECTION N/A 03/11/2020   Procedure: ENDOSCOPIC MUCOSAL RESECTION;  Surgeon: Rush Landmark Telford Nab., MD;  Location: [place];  Service: Gastroenterology;  Laterality: N/A;  . ESOPHAGOGASTRODUODENOSCOPY (EGD) WITH PROPOFOL N/A 10/26/2019   Procedure: ESOPHAGOGASTRODUODENOSCOPY (EGD) WITH PROPOFOL;  Surgeon: Rush Landmark Telford Nab., MD;  Location: [facility];  Service: Gastroenterology;  Laterality: N/A;  . EYE SURGERY  12/22/2009   cataracts  . HEMOSTASIS CLIP PLACEMENT  10/26/2019   Procedure: HEMOSTASIS CLIP PLACEMENT;  Surgeon: Irving Copas., MD;  Location: [facility];  Service: Gastroenterology;;  marking for transverse lesion  . INTRAMEDULLARY (IM) NAIL INTERTROCHANTERIC Left 02/24/2014   Procedure: IM ROD LEFT HIP FX;  Surgeon: Alta Corning, MD;  Location: [place];  Service: Orthopedics;  Laterality: Left;  . left distal radius fracture     2020  . POLYPECTOMY  10/26/2019   Procedure: POLYPECTOMY;  Surgeon: Mansouraty, Telford Nab., MD;  Location: [facility];  Service: Gastroenterology;;  . POLYPECTOMY  03/11/2020   Procedure: POLYPECTOMY;  Surgeon: Irving Copas., MD;  Location: [facility];  Service: Gastroenterology;;  . PORT-A-CATH REMOVAL Right 01/09/2014   Procedure: REMOVAL PORT-A-CATH;  Surgeon: [name], MD;  Location: [place];  Service: General;  Laterality: Right;  . PORTACATH PLACEMENT N/A 11/15/2013   Procedure: INSERTION PORT-A-CATH WITH ULTRA  SOUND AND FLOROSCOPY;  Surgeon: [name], MD;  Location: [place];  Service: General;  Laterality: N/A;  . SUBMUCOSAL TATTOO INJECTION  10/26/2019   Procedure: SUBMUCOSAL TATTOO INJECTION;  Surgeon: Irving Copas., MD;  Location: [facility];  Service: Gastroenterology;;  . TOTAL KNEE ARTHROPLASTY  2002   left   Current Outpatient Medications on File Prior to Visit  Medication Sig Dispense Refill  . amiodarone (PACERONE) 200 MG tablet Take 1 tablet (200 mg total) by mouth daily. 90 tablet 3  . apixaban (ELIQUIS) 2.5 MG TABS tablet Take 1 tablet (2.5 mg total) by mouth 2 (two) times daily. Provider has put Patient on 2.5 mg twice daily. 90 tablet 3  . atorvastatin (LIPITOR) 10 MG tablet Take 1 tablet (10 mg total) by mouth daily. 90 tablet 1  . benzonatate (TESSALON) 200 MG capsule Take 1 capsule (200 mg total) by mouth 3 (three) times daily as needed for cough. 20 capsule 0  . Blood Glucose Monitoring Suppl (ONETOUCH VERIO) w/Device KIT Use to test blood sugars daily. 1 kit 0  . cholecalciferol (VITAMIN D3) 25 MCG (1000 UNIT) tablet TAKE 1 TAB BY MOUTH EVERY DAY 100 tablet 1  . docusate sodium (COLACE) 100 MG capsule Take 100 mg by mouth daily.    Marland Kitchen doxazosin (CARDURA) 4 MG tablet Take 0.5 tablets (2 mg total) by mouth daily.    . ferrous sulfate 324 (65 Fe) MG TBEC TAKE 1 TABLET BY MOUTH IN THE MORNING AND 1 AT BEDTIME 180 tablet 1  . furosemide (LASIX) 40 MG tablet Take 2 tablets (80 mg total) by mouth daily. 90 tablet 2  . JANUVIA 100 MG tablet TAKE 0.5 TAB BY MOUTH DAILY 45 tablet 3  . KLOR-CON M20 20 MEQ tablet TAKE 1 TABLET BY MOUTH EVERY DAY 90 tablet 1  . meclizine (ANTIVERT) 12.5 MG tablet TAKE 1 TABLET BY MOUTH DAILY AS NEEDED FOR DIZZINESS 30 tablet 2  . metoprolol succinate (TOPROL-XL) 100 MG 24 hr tablet TAKE 1 TABLET BY MOUTH TWICE A DAY WITH OR IMMEDIATELY FOLLOWING A MEAL 180 tablet 1  . ONETOUCH VERIO test strip CHECK BLOOD SUGAR EVERY DAY AS DIRECTED 100 strip 2   . polyethylene glycol (MIRALAX / GLYCOLAX) 17 g packet Take 17 g by mouth daily. Also available OTC 30 each 0  . tamoxifen (NOLVADEX) 20 MG tablet TAKE 1 TABLET BY MOUTH EVERY DAY 90 tablet 3   Current Facility-Administered Medications on File Prior to Visit  Medication Dose Route Frequency Provider Last Rate Last Admin  . denosumab (PROLIA) injection 60 mg  60 mg Subcutaneous Q6 months [name], MD   60 mg at 04/19/20 1135   Allergies  Allergen Reactions  . Tape Other (See Comments)    Skin is somewhat sensitive   Social History   Socioeconomic History  . Marital status: Widowed    Spouse name: Not on file  . Number of children: 4  . Years of education: Not on file  . Highest education level: Not on file  Occupational History  . Not on file  Tobacco Use  . Smoking status: Former Smoker    Packs/day: 1.00    Years: 5.00  Pack years: 5.00    Quit date: 11/14/1958    Years since quitting: 61.6  . Smokeless tobacco: Never Used  Vaping Use  . Vaping Use: Never used  Substance and Sexual Activity  . Alcohol use: No    Alcohol/week: 0.0 standard drinks  . Drug use: No  . Sexual activity: Not Currently    Birth control/protection: Post-menopausal    Comment: menarche age 4, P42, first birth age 66, no HRT, menopause age 20  Other Topics Concern  . Not on file  Social History Narrative  . Not on file   Social Determinants of Health   Financial Resource Strain: Low Risk   . Difficulty of Paying Living Expenses: Not very hard  Food Insecurity: Not on file  Transportation Needs: Not on file  Physical Activity: Not on file  Stress: Not on file  Social Connections: Not on file  Intimate Partner Violence: Not on file      Review of Systems  All other systems reviewed and are negative.      Objective:   Physical Exam Vitals reviewed.  Constitutional:      General: She is not in acute distress.    Appearance: Normal appearance. She is not ill-appearing  or toxic-appearing.  HENT:     Right Ear: Tympanic membrane and ear canal normal.     Left Ear: Tympanic membrane and ear canal normal.     Nose: No congestion or rhinorrhea.     Mouth/Throat:     Pharynx: No oropharyngeal exudate or posterior oropharyngeal erythema.  Cardiovascular:     Rate and Rhythm: Normal rate. Rhythm irregular.     Heart sounds: Normal heart sounds.  Pulmonary:     Effort: Pulmonary effort is normal. No respiratory distress.     Breath sounds: Examination of the right-lower field reveals decreased breath sounds. Examination of the left-lower field reveals rales. Decreased breath sounds and rales present. No wheezing or rhonchi.    Chest:     Chest wall: No tenderness.  Abdominal:     General: Abdomen is flat. Bowel sounds are normal. There is no distension.     Palpations: Abdomen is soft.     Tenderness: There is no abdominal tenderness. There is no guarding or rebound.  Musculoskeletal:     Right lower leg: Edema present.     Left lower leg: Edema present.  Lymphadenopathy:     Cervical: No cervical adenopathy.  Neurological:     Mental Status: She is alert.           Assessment & Plan:  Cough - Plan: DG Chest 2 View, COMPLETE METABOLIC PANEL WITH GFR  Chronic diastolic CHF (congestive heart failure), NYHA class 2 (HCC)  Chronic kidney disease, stage 3a (HCC)  Recurrent pleural effusion  Patient appears fluid overloaded based on the edema in her legs, the crackles in her left lung, and what sounds like a recurrent pleural effusion in her right lung.  Increase Lasix to 80 mg twice a day and reassess in 48 hours.  Meanwhile abstain from sodium.  Obtain a chest x-ray to evaluate for reaccumulation of the pleural effusion as well as for pulmonary edema versus pneumonia and also check a CMP to monitor renal function given her history of chronic kidney disease.  Recheck in 48 hours or sooner if worse

## 2020-06-11 LAB — COMPLETE METABOLIC PANEL WITH GFR
AG Ratio: 1.5 (calc) (ref 1.0–2.5)
ALT: 35 U/L — ABNORMAL HIGH (ref 6–29)
AST: 43 U/L — ABNORMAL HIGH (ref 10–35)
Albumin: 3.2 g/dL — ABNORMAL LOW (ref 3.6–5.1)
Alkaline phosphatase (APISO): 38 U/L (ref 37–153)
BUN/Creatinine Ratio: 17 (calc) (ref 6–22)
BUN: 21 mg/dL (ref 7–25)
CO2: 28 mmol/L (ref 20–32)
Calcium: 8.1 mg/dL — ABNORMAL LOW (ref 8.6–10.4)
Chloride: 105 mmol/L (ref 98–110)
Creat: 1.26 mg/dL — ABNORMAL HIGH (ref 0.60–0.88)
GFR, Est African American: 43 mL/min/{1.73_m2} — ABNORMAL LOW (ref >=60)
GFR, Est Non African American: 37 mL/min/{1.73_m2} — ABNORMAL LOW (ref >=60)
Globulin: 2.2 g/dL (calc) (ref 1.9–3.7)
Glucose, Bld: 128 mg/dL — ABNORMAL HIGH (ref 65–99)
Potassium: 4 mmol/L (ref 3.5–5.3)
Sodium: 143 mmol/L (ref 135–146)
Total Bilirubin: 0.7 mg/dL (ref 0.2–1.2)
Total Protein: 5.4 g/dL — ABNORMAL LOW (ref 6.1–8.1)

## 2020-06-14 ENCOUNTER — Other Ambulatory Visit: Payer: Self-pay

## 2020-06-14 ENCOUNTER — Ambulatory Visit (INDEPENDENT_AMBULATORY_CARE_PROVIDER_SITE_OTHER): Payer: Medicare Other | Admitting: Family Medicine

## 2020-06-14 ENCOUNTER — Encounter: Payer: Self-pay | Admitting: Family Medicine

## 2020-06-14 VITALS — BP 134/72 | HR 89 | Temp 97.0°F | Ht 69.0 in

## 2020-06-14 DIAGNOSIS — I5032 Chronic diastolic (congestive) heart failure: Secondary | ICD-10-CM | POA: Diagnosis not present

## 2020-06-14 DIAGNOSIS — J90 Pleural effusion, not elsewhere classified: Secondary | ICD-10-CM | POA: Diagnosis not present

## 2020-06-14 DIAGNOSIS — R600 Localized edema: Secondary | ICD-10-CM | POA: Diagnosis not present

## 2020-06-14 NOTE — Progress Notes (Signed)
Subjective:    Patient ID: Whitney [name], female    DOB: [birth year], 87 y.o.   MRN: [id number]  06/10/20  Patient had a colonoscopy December 27 which revealed a 40 mm large polyp that was biopsied.  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  Patient was given the option over a partial colectomy versus rechecking in 3 to 4 months with a colonoscopy to see if the lesion had been removed in its entirety.  On March 7, she had a thoracentesis performed due to a large right-sided pleural effusion.  She presents today reporting a nonproductive cough.  She states that she constantly feels congested in her chest.  She also believe that she has a lot of postnasal drip and drainage that may be causing the cough.  She just cannot get it up.  She denies any fevers or chills.  She denies any purulent sputum.  She denies any shortness of breath but she is sedentary.  She denies any hemoptysis.  However over the last week or so she has noticed significant edema in both legs.  She has +2 edema in both legs distal to the knee.  She is taking Lasix 40 mg, 2 tablets a day.  She denies any orthopnea but she does sleep in a recliner due to back pain.  At that time, my plan was: Patient appears fluid overloaded based on the edema in her legs, the crackles in her left lung, and what sounds like a recurrent pleural effusion in her right lung.  Increase Lasix to 80 mg twice a day and reassess in 48 hours.  Meanwhile abstain from sodium.  Obtain a chest x-ray to evaluate for reaccumulation of the pleural effusion as well as for pulmonary edema versus pneumonia and also check a CMP to monitor renal function given her history of chronic kidney disease.  Recheck in 48 hours or sooner if worse  06/14/20 Chest x-rays revealed bilateral pleural effusions including the reaccumulation of the right pleural effusion that was recently drained 3 thoracentesis.  Patient also was diagnosed with  bibasilar atelectasis however I feel that this is more likely pulmonary edema.  She presents today for follow-up.  She is too weak to stand therefore I do not have an accurate weight.  However she continues to have +2 edema in both legs distal to the knee.  She still has diminished breath sounds in her right lung however this seems improved compared to her last visit.  The left lung sounds much clearer and there are no crackles that I appreciated in either lung today.   Past Surgical History:  Procedure Laterality Date  . ABDOMINAL HYSTERECTOMY    . AXILLARY LYMPH NODE DISSECTION Right 01/09/2014   Procedure: RIGHT AXILLARY LYMPH NODE DISECTION;  Surgeon: [name], MD;  Location: [place];  Service: General;  Laterality: Right;  . BIOPSY  10/26/2019   Procedure: BIOPSY;  Surgeon: Rush Landmark Telford Nab., MD;  Location: [facility];  Service: Gastroenterology;;  . BIOPSY  03/11/2020   Procedure: BIOPSY;  Surgeon: Irving Copas., MD;  Location: [place];  Service: Gastroenterology;;  . BREAST LUMPECTOMY WITH RADIOACTIVE SEED LOCALIZATION Right 01/09/2014   Procedure: RIGHT BREAST SEED LOCALIZED LUMPECTOMY ;  Surgeon: [name], MD;  Location: [place];  Service: General;  Laterality: Right;  . COLONOSCOPY WITH PROPOFOL N/A 10/26/2019   Procedure: COLONOSCOPY WITH PROPOFOL;  Surgeon: Rush Landmark Telford Nab., MD;  Location: [facility];  Service: Gastroenterology;  Laterality: N/A;  . COLONOSCOPY WITH PROPOFOL N/A 03/11/2020   Procedure: COLONOSCOPY WITH PROPOFOL- Ultraslim scope;  Surgeon: Irving Copas., MD;  Location: [place];  Service: Gastroenterology;  Laterality: N/A;  . ENDOSCOPIC MUCOSAL RESECTION N/A 03/11/2020   Procedure: ENDOSCOPIC MUCOSAL RESECTION;  Surgeon: Rush Landmark Telford Nab., MD;  Location: [place];  Service: Gastroenterology;  Laterality: N/A;  . ESOPHAGOGASTRODUODENOSCOPY (EGD) WITH PROPOFOL N/A  10/26/2019   Procedure: ESOPHAGOGASTRODUODENOSCOPY (EGD) WITH PROPOFOL;  Surgeon: Rush Landmark Telford Nab., MD;  Location: [facility];  Service: Gastroenterology;  Laterality: N/A;  . EYE SURGERY  12/22/2009   cataracts  . HEMOSTASIS CLIP PLACEMENT  10/26/2019   Procedure: HEMOSTASIS CLIP PLACEMENT;  Surgeon: Irving Copas., MD;  Location: [facility];  Service: Gastroenterology;;  marking for transverse lesion  . INTRAMEDULLARY (IM) NAIL INTERTROCHANTERIC Left 02/24/2014   Procedure: IM ROD LEFT HIP FX;  Surgeon: Alta Corning, MD;  Location: [place];  Service: Orthopedics;  Laterality: Left;  . left distal radius fracture     2020  . POLYPECTOMY  10/26/2019   Procedure: POLYPECTOMY;  Surgeon: Mansouraty, Telford Nab., MD;  Location: [facility];  Service: Gastroenterology;;  . POLYPECTOMY  03/11/2020   Procedure: POLYPECTOMY;  Surgeon: Irving Copas., MD;  Location: [place];  Service: Gastroenterology;;  . PORT-A-CATH REMOVAL Right 01/09/2014   Procedure: REMOVAL PORT-A-CATH;  Surgeon: [name], MD;  Location: [place];  Service: General;  Laterality: Right;  . PORTACATH PLACEMENT N/A 11/15/2013   Procedure: INSERTION PORT-A-CATH WITH ULTRA SOUND AND FLOROSCOPY;  Surgeon: [name], MD;  Location: [place];  Service: General;  Laterality: N/A;  . SUBMUCOSAL TATTOO INJECTION  10/26/2019   Procedure: SUBMUCOSAL TATTOO INJECTION;  Surgeon: Irving Copas., MD;  Location: [facility];  Service: Gastroenterology;;  . TOTAL KNEE ARTHROPLASTY  2002   left   Current Outpatient Medications on File Prior to Visit  Medication Sig Dispense Refill  . amiodarone (PACERONE) 200 MG tablet Take 1 tablet (200 mg total) by mouth daily. 90 tablet 3  . apixaban (ELIQUIS) 2.5 MG TABS tablet Take 1 tablet (2.5 mg total) by mouth 2 (two) times daily. Provider has put Patient on 2.5 mg twice daily. 90 tablet 3  . atorvastatin (LIPITOR) 10 MG tablet Take 1 tablet  (10 mg total) by mouth daily. 90 tablet 1  . benzonatate (TESSALON) 200 MG capsule Take 1 capsule (200 mg total) by mouth 3 (three) times daily as needed for cough. 20 capsule 0  . Blood Glucose Monitoring Suppl (ONETOUCH VERIO) w/Device KIT Use to test blood sugars daily. 1 kit 0  . cholecalciferol (VITAMIN D3) 25 MCG (1000 UNIT) tablet TAKE 1 TAB BY MOUTH EVERY DAY 100 tablet 1  . docusate sodium (COLACE) 100 MG capsule Take 100 mg by mouth daily.    Marland Kitchen doxazosin (CARDURA) 4 MG tablet Take 0.5 tablets (2 mg total) by mouth daily.    . ferrous sulfate 324 (65 Fe) MG TBEC TAKE 1 TABLET BY MOUTH IN THE MORNING AND 1 AT BEDTIME 180 tablet 1  . furosemide (LASIX) 40 MG tablet Take 2 tablets (80 mg total) by mouth daily. 90 tablet 2  . JANUVIA 100 MG tablet TAKE 0.5 TAB BY MOUTH DAILY 45 tablet 3  . KLOR-CON M20 20 MEQ tablet TAKE 1 TABLET BY MOUTH EVERY DAY 90 tablet 1  . meclizine (ANTIVERT) 12.5 MG tablet TAKE 1 TABLET BY MOUTH DAILY AS NEEDED FOR DIZZINESS 30 tablet 2  . metoprolol succinate (  TOPROL-XL) 100 MG 24 hr tablet TAKE 1 TABLET BY MOUTH TWICE A DAY WITH OR IMMEDIATELY FOLLOWING A MEAL 180 tablet 1  . ONETOUCH VERIO test strip CHECK BLOOD SUGAR EVERY DAY AS DIRECTED 100 strip 2  . polyethylene glycol (MIRALAX / GLYCOLAX) 17 g packet Take 17 g by mouth daily. Also available OTC 30 each 0  . tamoxifen (NOLVADEX) 20 MG tablet TAKE 1 TABLET BY MOUTH EVERY DAY 90 tablet 3   Current Facility-Administered Medications on File Prior to Visit  Medication Dose Route Frequency Provider Last Rate Last Admin  . denosumab (PROLIA) injection 60 mg  60 mg Subcutaneous Q6 months [name], MD   60 mg at 04/19/20 1135   Allergies  Allergen Reactions  . Tape Other (See Comments)    Skin is somewhat sensitive   Social History   Socioeconomic History  . Marital status: Widowed    Spouse name: Not on file  . Number of children: 4  . Years of education: Not on file  . Highest education level:  Not on file  Occupational History  . Not on file  Tobacco Use  . Smoking status: Former Smoker    Packs/day: 1.00    Years: 5.00    Pack years: 5.00    Quit date: 11/14/1958    Years since quitting: 61.6  . Smokeless tobacco: Never Used  Vaping Use  . Vaping Use: Never used  Substance and Sexual Activity  . Alcohol use: No    Alcohol/week: 0.0 standard drinks  . Drug use: No  . Sexual activity: Not Currently    Birth control/protection: Post-menopausal    Comment: menarche age 61, P59, first birth age 18, no HRT, menopause age 87  Other Topics Concern  . Not on file  Social History Narrative  . Not on file   Social Determinants of Health   Financial Resource Strain: Low Risk   . Difficulty of Paying Living Expenses: Not very hard  Food Insecurity: Not on file  Transportation Needs: Not on file  Physical Activity: Not on file  Stress: Not on file  Social Connections: Not on file  Intimate Partner Violence: Not on file      Review of Systems  All other systems reviewed and are negative.      Objective:   Physical Exam Vitals reviewed.  Constitutional:      General: She is not in acute distress.    Appearance: Normal appearance. She is not ill-appearing or toxic-appearing.  HENT:     Right Ear: Tympanic membrane and ear canal normal.     Left Ear: Tympanic membrane and ear canal normal.     Nose: No congestion or rhinorrhea.     Mouth/Throat:     Pharynx: No oropharyngeal exudate or posterior oropharyngeal erythema.  Cardiovascular:     Rate and Rhythm: Normal rate. Rhythm irregular.     Heart sounds: Normal heart sounds.  Pulmonary:     Effort: Pulmonary effort is normal. No respiratory distress.     Breath sounds: Examination of the right-lower field reveals decreased breath sounds. Examination of the left-lower field reveals rales. Decreased breath sounds and rales present. No wheezing or rhonchi.    Chest:     Chest wall: No tenderness.  Abdominal:      General: Abdomen is flat. Bowel sounds are normal. There is no distension.     Palpations: Abdomen is soft.     Tenderness: There is no abdominal tenderness. There is  no guarding or rebound.  Musculoskeletal:     Right lower leg: Edema present.     Left lower leg: Edema present.  Lymphadenopathy:     Cervical: No cervical adenopathy.  Neurological:     Mental Status: She is alert.           Assessment & Plan:  Recurrent right pleural effusion - Plan: BASIC METABOLIC PANEL WITH GFR, Brain natriuretic peptide  Chronic diastolic CHF (congestive heart failure), NYHA class 2 ([place])  I believe that the patient likely is demonstrating congestive heart failure.  She has a recurrent right pleural effusion along with left pleural effusion and appears fluid overloaded on exam.  Her presentation is more consistent with right-sided heart failure.  Given her age, 87 years old, I believe we need to focus on symptom control.  At the present time she is asymptomatic.  She denies any chest pain or shortness of breath and she feels comfortable.  Therefore I will check a BMP and a BNP.  If her renal function and her potassium are stable, I will continue her at the higher dose diuretic.  She is currently taking 80 mg of Lasix twice a day.  Previously she was taking 80 mg once a day.  However I believe that we need the higher dose diuretic to prevent pulmonary edema and dyspnea/hospitalization

## 2020-06-17 ENCOUNTER — Other Ambulatory Visit: Payer: Self-pay

## 2020-06-17 ENCOUNTER — Other Ambulatory Visit: Payer: Medicare Other

## 2020-06-17 DIAGNOSIS — J90 Pleural effusion, not elsewhere classified: Secondary | ICD-10-CM | POA: Diagnosis not present

## 2020-06-18 ENCOUNTER — Encounter: Payer: Self-pay | Admitting: *Deleted

## 2020-06-18 LAB — BASIC METABOLIC PANEL WITH GFR
BUN/Creatinine Ratio: 20 (calc) (ref 6–22)
BUN: 30 mg/dL — ABNORMAL HIGH (ref 7–25)
CO2: 30 mmol/L (ref 20–32)
Calcium: 8.3 mg/dL — ABNORMAL LOW (ref 8.6–10.4)
Chloride: 103 mmol/L (ref 98–110)
Creat: 1.5 mg/dL — ABNORMAL HIGH (ref 0.60–0.88)
GFR, Est African American: 35 mL/min/{1.73_m2} — ABNORMAL LOW (ref >=60)
GFR, Est Non African American: 30 mL/min/{1.73_m2} — ABNORMAL LOW (ref >=60)
Glucose, Bld: 171 mg/dL — ABNORMAL HIGH (ref 65–99)
Potassium: 4.1 mmol/L (ref 3.5–5.3)
Sodium: 142 mmol/L (ref 135–146)

## 2020-06-18 LAB — BRAIN NATRIURETIC PEPTIDE: Brain Natriuretic Peptide: 846 pg/mL — ABNORMAL HIGH (ref <100)

## 2020-06-25 DIAGNOSIS — I509 Heart failure, unspecified: Secondary | ICD-10-CM | POA: Diagnosis not present

## 2020-06-25 DIAGNOSIS — M810 Age-related osteoporosis without current pathological fracture: Secondary | ICD-10-CM | POA: Diagnosis not present

## 2020-06-25 DIAGNOSIS — D649 Anemia, unspecified: Secondary | ICD-10-CM | POA: Diagnosis not present

## 2020-06-27 NOTE — Progress Notes (Signed)
Chronic Care Management Pharmacy Note  07/02/2020 Name:  Whitney [name] MRN:  [id number] DOB:  [birth year]  Subjective: Whitney [name] is an 87 y.o. year old female who is a primary patient of Pickard, Cammie Mcgee, MD.  The CCM team was consulted for assistance with disease management and care coordination needs.    Engaged with patient by telephone for follow up visit in response to provider referral for pharmacy case management and/or care coordination services.   Consent to Services:  The patient was given the following information about Chronic Care Management services today, agreed to services, and gave verbal consent: 1. CCM service includes personalized support from designated clinical staff supervised by the primary care provider, including individualized plan of care and coordination with other care providers 2. 24/7 contact phone numbers for assistance for urgent and routine care needs. 3. Service will only be billed when office clinical staff spend 20 minutes or more in a month to coordinate care. 4. Only one practitioner may furnish and bill the service in a calendar month. 5.The patient may stop CCM services at any time (effective at the end of the month) by phone call to the office staff. 6. The patient will be responsible for cost sharing (co-pay) of up to 20% of the service fee (after annual deductible is met). Patient agreed to services and consent obtained.  Patient Care Team: [name], MD as PCP - General (Family Medicine) Nicholas Lose, MD as Consulting Physician (Hematology and Oncology) [name], MD as Consulting Physician (General Surgery) Edythe Clarity, [facility] as Pharmacist (Pharmacist)  Recent office visits: 06/14/20 Dennard Schaumann) - check renal function, continue higher dose of Lasix  06/10/20 (Pickard) - Lasix was increased to 29m twice daily based on edema in her legs.    05/14/20 (Pickard) - dry mouth, recommend Biotene or hard candy, do not want  to d/c diuretic due to fluid overload risk.  Recent consult visits: 05/20/20 ([place], pulmonary) - removal of fluid from lungs tolerated well,    Hospital visits: None in previous 6 months  Objective:  Lab Results  Component Value Date   CREATININE 1.50 (H) 06/17/2020   BUN 30 (H) 06/17/2020   GFR 39.53 (L) 07/19/2019   GFRNONAA 30 (L) 06/17/2020   GFRAA 35 (L) 06/17/2020   NA 142 06/17/2020   K 4.1 06/17/2020   CALCIUM 8.3 (L) 06/17/2020   CO2 30 06/17/2020   GLUCOSE 171 (H) 06/17/2020    Lab Results  Component Value Date/Time   HGBA1C 5.8 (A) 01/19/2020 10:47 AM   HGBA1C 6.1 (A) 07/19/2019 09:15 AM   HGBA1C 6.4 (H) 04/18/2018 08:48 AM   HGBA1C 6.1 (H) 10/25/2016 04:43 AM   FRUCTOSAMINE 306 (H) 03/26/2016 10:44 AM   GFR 39.53 (L) 07/19/2019 12:34 PM   GFR 47.12 (L) 10/28/2018 10:49 AM   MICROALBUR 6.1 04/18/2018 08:48 AM   MICROALBUR 5.3 (H) 06/29/2017 09:16 AM    Last diabetic Eye exam:  Lab Results  Component Value Date/Time   HMDIABEYEEXA No Retinopathy 12/04/2015 12:00 AM    Last diabetic Foot exam: No results found for: HMDIABFOOTEX   Lab Results  Component Value Date   CHOL 110 07/19/2019   HDL 40.10 07/19/2019   LDLCALC 53 07/19/2019   TRIG 82.0 07/19/2019   CHOLHDL 3 07/19/2019    Hepatic Function Latest Ref Rng & Units 06/10/2020 01/29/2020 01/19/2020  Total Protein 6.1 - 8.1 g/dL 5.4(L) 5.9(L) 5.7(L)  Albumin 3.5 - 5.2 g/dL - - 3.2(L)  AST 10 - 35 U/L 43(H) 41(H) 40(H)  ALT 6 - 29 U/L 35(H) 29 29  Alk Phosphatase 39 - 117 U/L - - 31(L)  Total Bilirubin 0.2 - 1.2 mg/dL 0.7 0.6 0.5  Bilirubin, Direct 0.0 - 0.3 mg/dL - - 0.2    Lab Results  Component Value Date/Time   TSH 1.27 01/19/2020 12:22 PM   TSH 0.74 07/19/2019 12:34 PM   FREET4 1.44 01/19/2020 12:22 PM   FREET4 1.58 10/28/2018 10:49 AM    CBC Latest Ref Rng & Units 01/29/2020 11/28/2019 11/17/2019  WBC 3.8 - 10.8 Thousand/uL 3.9 4.6 4.0  Hemoglobin 11.7 - 15.5 g/dL 10.3(L) 9.8(L)  9.6(L)  Hematocrit 35.0 - 45.0 % 30.9(L) 29.2(L) 29.7(L)  Platelets 140 - 400 Thousand/uL 159 165.0 183    Lab Results  Component Value Date/Time   VD25OH 68.18 01/19/2020 12:22 PM   VD25OH 72.43 07/19/2019 12:34 PM    Clinical ASCVD: No  The ASCVD Risk score Mikey Bussing DC Jr., et al., 2013) failed to calculate for the following reasons:   The 2013 ASCVD risk score is only valid for ages 23 to 29    Depression screen PHQ 2/9 04/18/2018 11/05/2017 12/07/2016  Decreased Interest 0 0 1  Down, Depressed, Hopeless 0 0 2  PHQ - 2 Score 0 0 3  Altered sleeping - - 0  Tired, decreased energy - - 1  Change in appetite - - 0  Feeling bad or failure about yourself  - - 2  Trouble concentrating - - 0  Moving slowly or fidgety/restless - - 0  Suicidal thoughts - - 0  PHQ-9 Score - - 6  Difficult doing work/chores - - Not difficult at all  Some recent data might be hidden     Social History   Tobacco Use  Smoking Status Former Research scientist (life sciences)  . Packs/day: 1.00  . Years: 5.00  . Pack years: 5.00  . Quit date: 11/14/1958  . Years since quitting: 61.6  Smokeless Tobacco Never Used   BP Readings from Last 3 Encounters:  06/14/20 134/72  06/10/20 (!) 142/84  05/24/20 (!) 148/82   Pulse Readings from Last 3 Encounters:  06/14/20 89  06/10/20 88  05/24/20 90   Wt Readings from Last 3 Encounters:  05/20/20 156 lb (70.8 kg)  05/07/20 166 lb 6.4 oz (75.5 kg)  04/19/20 164 lb (74.4 kg)   BMI Readings from Last 3 Encounters:  06/14/20 23.04 kg/m  05/24/20 23.04 kg/m  05/20/20 23.04 kg/m    Assessment/Interventions: Review of patient past medical history, allergies, medications, health status, including review of consultants reports, laboratory and other test data, was performed as part of comprehensive evaluation and provision of chronic care management services.   SDOH:  (Social Determinants of Health) assessments and interventions performed: Yes   Financial Resource Strain: Low Risk   .  Difficulty of Paying Living Expenses: Not very hard    SDOH Screenings   Alcohol Screen: Not on file  Depression (PPJ0-9): Not on file  Financial Resource Strain: Low Risk   . Difficulty of Paying Living Expenses: Not very hard  Food Insecurity: Not on file  Housing: Not on file  Physical Activity: Not on file  Social Connections: Not on file  Stress: Not on file  Tobacco Use: Medium Risk  . Smoking Tobacco Use: Former Smoker  . Smokeless Tobacco Use: Never Used  Transportation Needs: Not on file    CCM Care Plan  Allergies  Allergen Reactions  . Tape  Other (See Comments)    Skin is somewhat sensitive    Medications Reviewed Today    Reviewed by Edythe Clarity, [facility] (Pharmacist) on 07/02/20 at [phone]  Med List Status: <None>  Medication Order Taking? Sig Documenting Provider Last Dose Status Informant  amiodarone (PACERONE) 200 MG tablet [id number] Yes Take 1 tablet (200 mg total) by mouth daily. [name], MD Taking Active   apixaban Arne [place]) 2.5 MG TABS tablet [id number] Yes Take 1 tablet (2.5 mg total) by mouth 2 (two) times daily. Provider has put Patient on 2.5 mg twice daily. Mansouraty, Telford Nab., MD Taking Active   atorvastatin (LIPITOR) 10 MG tablet [id number] Yes Take 1 tablet (10 mg total) by mouth daily. [name], MD Taking Active   benzonatate (TESSALON) 200 MG capsule [id number] Yes Take 1 capsule (200 mg total) by mouth 3 (three) times daily as needed for cough. [name], MD Taking Active   Blood Glucose Monitoring Suppl [facility] VERIO) w/Device Drucie Opitz [id number] Yes Use to test blood sugars daily. Elayne Snare, MD Taking Active Family Member  cholecalciferol (VITAMIN D3) 25 MCG (1000 UNIT) tablet [id number] Yes TAKE 1 TAB BY MOUTH EVERY DAY [name], MD Taking Active   denosumab [facility]) injection 60 mg [id number]   [name], MD  Active   docusate sodium (COLACE) 100 MG capsule [id number] Yes Take 100 mg by mouth daily.  [provider] Taking Active Family Member  doxazosin (CARDURA) 4 MG tablet [id number] Yes Take 0.5 tablets (2 mg total) by mouth daily. Mendel Corning, MD Taking Active Family Member  ferrous sulfate 324 (65 Fe) MG TBEC [id number] Yes TAKE 1 TABLET BY MOUTH IN THE MORNING AND 1 AT BEDTIME [name], MD Taking Active   furosemide (LASIX) 40 MG tablet [id number] Yes Take 2 tablets (80 mg total) by mouth daily.  Patient taking differently: Take 80 mg by mouth 2 (two) times daily.   [name], MD Taking Active   JANUVIA 100 MG tablet [id number] Yes TAKE 0.5 TAB BY MOUTH DAILY [name], MD Taking Active   KLOR-CON M20 20 MEQ tablet [id number] Yes TAKE 1 TABLET BY MOUTH EVERY DAY Elayne Snare, MD Taking Active   meclizine (ANTIVERT) 12.5 MG tablet [id number] Yes TAKE 1 TABLET BY MOUTH DAILY AS NEEDED FOR DIZZINESS [name], MD Taking Active   metoprolol succinate (TOPROL-XL) 100 MG 24 hr tablet [id number] Yes TAKE 1 TABLET BY MOUTH TWICE A DAY WITH OR IMMEDIATELY FOLLOWING A MEAL Elayne Snare, MD Taking Active   [facility] VERIO test strip [id number] Yes CHECK BLOOD SUGAR EVERY DAY AS DIRECTED Elayne Snare, MD Taking Active   polyethylene glycol (MIRALAX / GLYCOLAX) 17 g packet [id number] Yes Take 17 g by mouth daily. Also available OTC Rai, Ripudeep K, MD Taking Active Family Member  tamoxifen (NOLVADEX) 20 MG tablet [id number] Yes TAKE 1 TABLET BY MOUTH EVERY DAY Nicholas Lose, MD Taking Active           Patient Active Problem List   Diagnosis Date Noted  . Chronic colitis 05/21/2020  . 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 History of colon polyps 12/01/2019  . Colitis, acute 12/01/2019  . Diverticulosis 12/01/2019  . Stenosis colon ([place]) 12/01/2019  . Abnormal colonoscopy 12/01/2019  . Anemia 10/25/2019  . Bright red blood per rectum 10/24/2019  . Chronic kidney disease,  stage 3a ([place]) 10/24/2019  .  Coagulopathy ([place])   . Osteoporosis   . Unilateral primary osteoarthritis, right knee 03/25/2018  . Chronic pain of right knee 05/25/2017  . Bacteremia 10/25/2016  . Transaminitis 10/23/2016  . Sinus bradycardia 10/23/2016  . Controlled diabetes mellitus type 2 with complications ([place])   . Paroxysmal atrial fibrillation (HCC)   . Chronic diastolic CHF (congestive heart failure) ([place])   . Pulmonary hypertension ([place])   . Acute renal failure superimposed on stage 2 chronic kidney disease ([place])   . Elevated liver enzymes   . Bacteremia due to Escherichia coli 12/30/2015  . UTI (urinary tract infection) 12/30/2015  . Acute blood loss anemia 02/25/2014  . Closed left hip fracture ([place]) 02/24/2014  . PAF (paroxysmal atrial fibrillation) ([place]) 01/30/2014  . Anticoagulated 01/30/2014  . Chronic diastolic CHF (congestive heart failure), NYHA class 2 ([place]) 12/10/2013  . Preoperative cardiovascular examination 12/10/2013  . Neutropenic fever ([place]) 12/08/2013  . Anemia of chronic disease 12/08/2013  . Thrombocytopenia ([place]) 12/08/2013  . Hypokalemia 12/08/2013  . Pancytopenia due to antineoplastic chemotherapy ([place]) 12/08/2013  . Hypothyroidism 12/08/2013  . Breast cancer of upper-outer quadrant of right female breast ([place]) 11/01/2013  . Essential hypertension, benign 12/01/2012  . Pure hypercholesterolemia 12/01/2012  . DM2 (diabetes mellitus, type 2) ([place]) 11/21/2012    Immunization History  Administered Date(s) Administered  . Fluad Quad(high Dose 65+) 11/18/2018, 11/17/2019  . Influenza Split 01/14/2013  . Influenza, High Dose Seasonal PF 12/07/2016, 12/14/2017  . Influenza,inj,Quad PF,6+ Mos 01/01/2014, 12/21/2014, 12/19/2015  . Moderna Sars-Covid-2 Vaccination 04/18/2019, 05/17/2019  . Pneumococcal Conjugate-13 07/23/2014  . Pneumococcal Polysaccharide-23 06/25/2008, 02/26/2014  . Zoster Recombinat (Shingrix) 04/18/2018    Conditions to be  addressed/monitored:  HTN, CHF, Afib, Type II DM, Hypothyroidism,CKD, Hypercholesterolemia.   Care Plan : General Pharmacy (Adult)  Updates made by Edythe Clarity, RPH since 07/02/2020 12:00 AM    Problem: HTN, CHF, Afib, Type II DM, Hypothyroidism,CKD, Hypercholesterolemia.   Priority: High  Onset Date: 07/02/2020    Long-Range Goal: Patient-Specific Goal   Start Date: 07/01/2020  Expected End Date: 01/01/2021  This Visit's Progress: On track  Priority: High  Note:   Current Barriers:  . Unable to independently monitor therapeutic efficacy   Pharmacist Clinical Goal(s):  Marland Kitchen Patient will achieve adherence to monitoring guidelines and medication adherence to achieve therapeutic efficacy . maintain control of glucose as evidenced by home monitoring  . contact provider office for questions/concerns as evidenced notation of same in electronic health record . Avoid blood sugars < 85. through collaboration with PharmD and provider.   Interventions: . 1:1 collaboration with [name], MD regarding development and update of comprehensive plan of care as evidenced by provider attestation and co-signature . Inter-disciplinary care team collaboration (see longitudinal plan of care) . Comprehensive medication review performed; medication list updated in electronic medical record  Hypertension (BP goal <130/80) -Controlled -Current treatment: . Doxazosin 75m daily . Metoprolol XL 1021mdaily -Medications previously tried:none noted -Current home readings: not checking at home  -Denies hypotensive/hypertensive symptoms -Educated on BP goals and benefits of medications for prevention of heart attack, stroke and kidney damage; Importance of home blood pressure monitoring; Symptoms of hypotension and importance of maintaining adequate hydration; -Counseled to monitor BP at home as able, document, and provide log at future appointments -Recommended to continue current  medication Recommended contact providers with symptoms of dizziness or HA  Hyperlipidemia: (LDL goal < 70) -Controlled -Current treatment: . Atorvastatin 2043mMedications previously tried: none noted  -Current  dietary patterns: trying to limit sweets  -Educated on Cholesterol goals;  Benefits of statin for ASCVD risk reduction; Importance of limiting foods high in cholesterol;  -Most recent lipid panel well controlled -Recommended to continue current medication  Diabetes (A1c goal <7%) -Controlled -Current medications: . Januvia 170m daily -Medications previously tried: none noted -Current home glucose readings . fasting glucose: most readings between 100-200 per patient and daughter -Denies hypoglycemic/hyperglycemic symptoms  -Educated on A1c and blood sugar goals; Prevention and management of hypoglycemic episodes; Benefits of routine self-monitoring of blood sugar; -Counseled to check feet daily and get yearly eye exams -Recommended to continue current medication Recommended contact providers should she experience and blood sugars < 85.  Most recent A1c down to 5.8 want to ensure patient not having hypoglycemia.  Atrial Fibrillation (Goal: prevent stroke and major bleeding) -Controlled  -Current treatment: . Rate control: Amiodarone 2067mdaily . Anticoagulation: Eliquis 2.32m10mID -Medications previously tried: none noted -Home BP and HR readings: not checking at home, well controlled last OV -Counseled on increased risk of stroke due to Afib and benefits of anticoagulation for stroke prevention; bleeding risk associated with Eliquis and importance of self-monitoring for signs/symptoms of bleeding; avoidance of NSAIDs due to increased bleeding risk with anticoagulants;  -Blood counts have improved with reduced dose of Eliquis, denies any abnormal bleeding or bruising. -Recommended to continue current medication  Heart Failure (Goal: manage symptoms and prevent  exacerbations) -Controlled -Last ejection fraction: 60-65%  -HF type: Diastolic  -Current treatment: . Furosemide 50m102mo tablets twice daily . Metoprolol XL 100mg76mly -Medications previously tried: none noted  -Current home BP/HR readings: not checking  -Educated on Benefits of medications for managing symptoms and prolonging life Importance of weighing daily; if you gain more than 3 pounds in one day or 5 pounds in one week, contact providers Proper diuretic administration and potassium supplementation Importance of blood pressure control  -Still reports some swelling in feet, counseled to elevate whenever possible. -Denies SOB -Recommended to continue current medication   Patient Goals/Self-Care Activities . Patient will:  - take medications as prescribed focus on medication adherence by fill date and pill counts check glucose daily, document, and provide at future appointments weigh daily, and contact provider if weight gain of 3 lbs in one day or 5 lbs in one week  Follow Up Plan: The care management team will reach out to the patient again over the next 120 days.        Medication Assistance: None required.  Patient affirms current coverage meets needs.  Patient's preferred pharmacy is:  [id number] [address], [place] [address] [address]: [phone] pill box? Yes Pt endorses 100% compliance  We discussed: Benefits of medication synchronization, packaging and delivery as well as enhanced pharmacist oversight with [employer]. Patient decided to: Continue current medication management strategy  Care Plan and Follow Up Patient Decision:  Patient agrees to Care Plan and Follow-up.  Plan: The care management team will reach out to the patient again over the next 120 days.  ChristBeverly MilchmD Clinical Pharmacist [place] [phone]

## 2020-07-01 ENCOUNTER — Ambulatory Visit (INDEPENDENT_AMBULATORY_CARE_PROVIDER_SITE_OTHER): Payer: Medicare Other | Admitting: Pharmacist

## 2020-07-01 DIAGNOSIS — I10 Essential (primary) hypertension: Secondary | ICD-10-CM | POA: Diagnosis not present

## 2020-07-01 DIAGNOSIS — I5032 Chronic diastolic (congestive) heart failure: Secondary | ICD-10-CM | POA: Diagnosis not present

## 2020-07-01 DIAGNOSIS — E118 Type 2 diabetes mellitus with unspecified complications: Secondary | ICD-10-CM | POA: Diagnosis not present

## 2020-07-01 DIAGNOSIS — I480 Paroxysmal atrial fibrillation: Secondary | ICD-10-CM

## 2020-07-02 ENCOUNTER — Telehealth: Payer: Self-pay

## 2020-07-02 NOTE — Patient Instructions (Addendum)
Visit Information  Goals Addressed            This Visit's Progress   . Track and Manage Fluids and Swelling-Heart Failure       Timeframe:  Long-Range Goal Priority:  High Start Date:  07/01/20                           Expected End Date:    12/31/20                   Follow Up Date 10/13/20   - call office if I gain more than 2 pounds in one day or 5 pounds in one week - keep legs up while sitting - use salt in moderation - watch for swelling in feet, ankles and legs every day    Why is this important?    It is important to check your weight daily and watch how much salt and liquids you have.   It will help you to manage your heart failure.    Notes:       Patient Care Plan: General Pharmacy (Adult)    Problem Identified: HTN, CHF, Afib, Type II DM, Hypothyroidism,CKD, Hypercholesterolemia.   Priority: High  Onset Date: 07/02/2020    Long-Range Goal: Patient-Specific Goal   Start Date: 07/01/2020  Expected End Date: 01/01/2021  This Visit's Progress: On track  Priority: High  Note:   Current Barriers:  . Unable to independently monitor therapeutic efficacy   Pharmacist Clinical Goal(s):  Marland Kitchen Patient will achieve adherence to monitoring guidelines and medication adherence to achieve therapeutic efficacy . maintain control of glucose as evidenced by home monitoring  . contact provider office for questions/concerns as evidenced notation of same in electronic health record . Avoid blood sugars < 85. through collaboration with PharmD and provider.   Interventions: . 1:1 collaboration with [name], MD regarding development and update of comprehensive plan of care as evidenced by provider attestation and co-signature . Inter-disciplinary care team collaboration (see longitudinal plan of care) . Comprehensive medication review performed; medication list updated in electronic medical record  Hypertension (BP goal <130/80) -Controlled -Current  treatment: . Doxazosin 2mg  daily . Metoprolol XL 100mg  daily -Medications previously tried:none noted -Current home readings: not checking at home  -Denies hypotensive/hypertensive symptoms -Educated on BP goals and benefits of medications for prevention of heart attack, stroke and kidney damage; Importance of home blood pressure monitoring; Symptoms of hypotension and importance of maintaining adequate hydration; -Counseled to monitor BP at home as able, document, and provide log at future appointments -Recommended to continue current medication Recommended contact providers with symptoms of dizziness or HA  Hyperlipidemia: (LDL goal < 70) -Controlled -Current treatment: . Atorvastatin 20mg  -Medications previously tried: none noted  -Current dietary patterns: trying to limit sweets  -Educated on Cholesterol goals;  Benefits of statin for ASCVD risk reduction; Importance of limiting foods high in cholesterol;  -Most recent lipid panel well controlled -Recommended to continue current medication  Diabetes (A1c goal <7%) -Controlled -Current medications: . Januvia 100mg  daily -Medications previously tried: none noted -Current home glucose readings . fasting glucose: most readings between 100-200 per patient and daughter -Denies hypoglycemic/hyperglycemic symptoms  -Educated on A1c and blood sugar goals; Prevention and management of hypoglycemic episodes; Benefits of routine self-monitoring of blood sugar; -Counseled to check feet daily and get yearly eye exams -Recommended to continue current medication Recommended contact providers should she experience and blood sugars < 85.  Most recent A1c down to 5.8 want to ensure patient not having hypoglycemia.  Atrial Fibrillation (Goal: prevent stroke and major bleeding) -Controlled  -Current treatment: . Rate control: Amiodarone 200mg  daily . Anticoagulation: Eliquis 2.5mg  BID -Medications previously tried: none noted -Home  BP and HR readings: not checking at home, well controlled last OV -Counseled on increased risk of stroke due to Afib and benefits of anticoagulation for stroke prevention; bleeding risk associated with Eliquis and importance of self-monitoring for signs/symptoms of bleeding; avoidance of NSAIDs due to increased bleeding risk with anticoagulants;  -Blood counts have improved with reduced dose of Eliquis, denies any abnormal bleeding or bruising. -Recommended to continue current medication  Heart Failure (Goal: manage symptoms and prevent exacerbations) -Controlled -Last ejection fraction: 60-65%  -HF type: Diastolic  -Current treatment: . Furosemide 40mg  two tablets twice daily . Metoprolol XL 100mg  daily -Medications previously tried: none noted  -Current home BP/HR readings: not checking  -Educated on Benefits of medications for managing symptoms and prolonging life Importance of weighing daily; if you gain more than 3 pounds in one day or 5 pounds in one week, contact providers Proper diuretic administration and potassium supplementation Importance of blood pressure control  -Still reports some swelling in feet, counseled to elevate whenever possible. -Denies SOB -Recommended to continue current medication   Patient Goals/Self-Care Activities . Patient will:  - take medications as prescribed focus on medication adherence by fill date and pill counts check glucose daily, document, and provide at future appointments weigh daily, and contact provider if weight gain of 3 lbs in one day or 5 lbs in one week  Follow Up Plan: The care management team will reach out to the patient again over the next 120 days.        The patient verbalized understanding of instructions, educational materials, and care plan provided today and agreed to receive a mailed copy of patient instructions, educational materials, and care plan.  Telephone follow up appointment with pharmacy team member  scheduled for: 4 months  Whitney [name], [facility]

## 2020-07-03 ENCOUNTER — Telehealth: Payer: Self-pay | Admitting: *Deleted

## 2020-07-03 NOTE — Telephone Encounter (Signed)
Received call from patient granddaughter [place].   Reports that patient was getting out of the shower and she noted bright red blood on the shower stool. States that she believes blood was coming from rectum. States that bleeding has now stopped.   Appointment scheduled for evaluation.   Advised if copious amount of blood noted, take patient to ER.

## 2020-07-04 ENCOUNTER — Inpatient Hospital Stay (HOSPITAL_COMMUNITY)
Admission: EM | Admit: 2020-07-04 | Discharge: 2020-07-06 | DRG: 393 | Disposition: A | Discharge status: Home / Self Care | Payer: Medicare Other | Attending: Internal Medicine | Admitting: Internal Medicine

## 2020-07-04 ENCOUNTER — Other Ambulatory Visit: Payer: Self-pay

## 2020-07-04 ENCOUNTER — Ambulatory Visit (INDEPENDENT_AMBULATORY_CARE_PROVIDER_SITE_OTHER): Payer: Medicare Other | Admitting: Family Medicine

## 2020-07-04 ENCOUNTER — Emergency Department (HOSPITAL_COMMUNITY): Payer: Medicare Other

## 2020-07-04 ENCOUNTER — Encounter: Payer: Self-pay | Admitting: Family Medicine

## 2020-07-04 VITALS — BP 138/70 | HR 78 | Temp 98.7°F | Resp 16

## 2020-07-04 DIAGNOSIS — Z853 Personal history of malignant neoplasm of breast: Secondary | ICD-10-CM

## 2020-07-04 DIAGNOSIS — I130 Hypertensive heart and chronic kidney disease with heart failure and stage 1 through stage 4 chronic kidney disease, or unspecified chronic kidney disease: Secondary | ICD-10-CM | POA: Diagnosis present

## 2020-07-04 DIAGNOSIS — D72819 Decreased white blood cell count, unspecified: Secondary | ICD-10-CM | POA: Diagnosis present

## 2020-07-04 DIAGNOSIS — E118 Type 2 diabetes mellitus with unspecified complications: Secondary | ICD-10-CM | POA: Diagnosis not present

## 2020-07-04 DIAGNOSIS — D631 Anemia in chronic kidney disease: Secondary | ICD-10-CM | POA: Diagnosis present

## 2020-07-04 DIAGNOSIS — K5190 Ulcerative colitis, unspecified, without complications: Secondary | ICD-10-CM | POA: Diagnosis not present

## 2020-07-04 DIAGNOSIS — E1122 Type 2 diabetes mellitus with diabetic chronic kidney disease: Secondary | ICD-10-CM | POA: Diagnosis present

## 2020-07-04 DIAGNOSIS — Z91048 Other nonmedicinal substance allergy status: Secondary | ICD-10-CM

## 2020-07-04 DIAGNOSIS — Z87891 Personal history of nicotine dependence: Secondary | ICD-10-CM | POA: Diagnosis not present

## 2020-07-04 DIAGNOSIS — Z7901 Long term (current) use of anticoagulants: Secondary | ICD-10-CM | POA: Diagnosis not present

## 2020-07-04 DIAGNOSIS — Z20822 Contact with and (suspected) exposure to covid-19: Secondary | ICD-10-CM | POA: Diagnosis present

## 2020-07-04 DIAGNOSIS — E039 Hypothyroidism, unspecified: Secondary | ICD-10-CM | POA: Diagnosis not present

## 2020-07-04 DIAGNOSIS — I10 Essential (primary) hypertension: Secondary | ICD-10-CM | POA: Diagnosis present

## 2020-07-04 DIAGNOSIS — I5032 Chronic diastolic (congestive) heart failure: Secondary | ICD-10-CM

## 2020-07-04 DIAGNOSIS — K922 Gastrointestinal hemorrhage, unspecified: Secondary | ICD-10-CM

## 2020-07-04 DIAGNOSIS — E785 Hyperlipidemia, unspecified: Secondary | ICD-10-CM | POA: Diagnosis present

## 2020-07-04 DIAGNOSIS — D539 Nutritional anemia, unspecified: Secondary | ICD-10-CM | POA: Diagnosis present

## 2020-07-04 DIAGNOSIS — K5790 Diverticulosis of intestine, part unspecified, without perforation or abscess without bleeding: Secondary | ICD-10-CM

## 2020-07-04 DIAGNOSIS — I4891 Unspecified atrial fibrillation: Secondary | ICD-10-CM | POA: Diagnosis not present

## 2020-07-04 DIAGNOSIS — I480 Paroxysmal atrial fibrillation: Secondary | ICD-10-CM | POA: Diagnosis present

## 2020-07-04 DIAGNOSIS — J811 Chronic pulmonary edema: Secondary | ICD-10-CM | POA: Diagnosis not present

## 2020-07-04 DIAGNOSIS — N1832 Chronic kidney disease, stage 3b: Secondary | ICD-10-CM | POA: Diagnosis present

## 2020-07-04 DIAGNOSIS — Z86010 Personal history of colonic polyps: Secondary | ICD-10-CM

## 2020-07-04 DIAGNOSIS — Z79899 Other long term (current) drug therapy: Secondary | ICD-10-CM | POA: Diagnosis not present

## 2020-07-04 DIAGNOSIS — D72810 Lymphocytopenia: Secondary | ICD-10-CM | POA: Diagnosis not present

## 2020-07-04 DIAGNOSIS — I5033 Acute on chronic diastolic (congestive) heart failure: Secondary | ICD-10-CM | POA: Diagnosis not present

## 2020-07-04 DIAGNOSIS — K648 Other hemorrhoids: Principal | ICD-10-CM | POA: Diagnosis present

## 2020-07-04 DIAGNOSIS — K625 Hemorrhage of anus and rectum: Secondary | ICD-10-CM

## 2020-07-04 DIAGNOSIS — D649 Anemia, unspecified: Secondary | ICD-10-CM | POA: Diagnosis not present

## 2020-07-04 DIAGNOSIS — R9431 Abnormal electrocardiogram [ECG] [EKG]: Secondary | ICD-10-CM | POA: Diagnosis not present

## 2020-07-04 DIAGNOSIS — I272 Pulmonary hypertension, unspecified: Secondary | ICD-10-CM

## 2020-07-04 DIAGNOSIS — Z7984 Long term (current) use of oral hypoglycemic drugs: Secondary | ICD-10-CM | POA: Diagnosis not present

## 2020-07-04 DIAGNOSIS — J90 Pleural effusion, not elsewhere classified: Secondary | ICD-10-CM | POA: Diagnosis not present

## 2020-07-04 LAB — POC OCCULT BLOOD, ED: Fecal Occult Bld: POSITIVE — AB

## 2020-07-04 LAB — PROTIME-INR
INR: 1.7 — ABNORMAL HIGH (ref 0.8–1.2)
Prothrombin Time: 20 seconds — ABNORMAL HIGH (ref 11.4–15.2)

## 2020-07-04 LAB — TYPE AND SCREEN
ABO/RH(D): O POS
Antibody Screen: NEGATIVE

## 2020-07-04 LAB — CBC
HCT: 30.5 % — ABNORMAL LOW (ref 36.0–46.0)
Hemoglobin: 9.9 g/dL — ABNORMAL LOW (ref 12.0–15.0)
MCH: 35.7 pg — ABNORMAL HIGH (ref 26.0–34.0)
MCHC: 32.5 g/dL (ref 30.0–36.0)
MCV: 110.1 fL — ABNORMAL HIGH (ref 80.0–100.0)
Platelets: 165 10*3/uL (ref 150–400)
RBC: 2.77 MIL/uL — ABNORMAL LOW (ref 3.87–5.11)
RDW: 15.2 % (ref 11.5–15.5)
WBC: 3.5 10*3/uL — ABNORMAL LOW (ref 4.0–10.5)
nRBC: 0 % (ref 0.0–0.2)

## 2020-07-04 LAB — HEPATIC FUNCTION PANEL
ALT: 32 U/L (ref 0–44)
AST: 43 U/L — ABNORMAL HIGH (ref 15–41)
Albumin: 2.8 g/dL — ABNORMAL LOW (ref 3.5–5.0)
Alkaline Phosphatase: 34 U/L — ABNORMAL LOW (ref 38–126)
Bilirubin, Direct: 0.3 mg/dL — ABNORMAL HIGH (ref 0.0–0.2)
Indirect Bilirubin: 0.5 mg/dL (ref 0.3–0.9)
Total Bilirubin: 0.8 mg/dL (ref 0.3–1.2)
Total Protein: 5.2 g/dL — ABNORMAL LOW (ref 6.5–8.1)

## 2020-07-04 LAB — BASIC METABOLIC PANEL
Anion gap: 9 (ref 5–15)
BUN: 29 mg/dL — ABNORMAL HIGH (ref 8–23)
CO2: 25 mmol/L (ref 22–32)
Calcium: 8.5 mg/dL — ABNORMAL LOW (ref 8.9–10.3)
Chloride: 105 mmol/L (ref 98–111)
Creatinine, Ser: 1.56 mg/dL — ABNORMAL HIGH (ref 0.44–1.00)
GFR, Estimated: 31 mL/min — ABNORMAL LOW (ref >60)
Glucose, Bld: 170 mg/dL — ABNORMAL HIGH (ref 70–99)
Potassium: 3.8 mmol/L (ref 3.5–5.1)
Sodium: 139 mmol/L (ref 135–145)

## 2020-07-04 LAB — FOLATE: Folate: 12.5 ng/mL (ref >5.9)

## 2020-07-04 LAB — HEMOGLOBIN, FINGERSTICK: POC HEMOGLOBIN: 7.4 g/dL — ABNORMAL LOW (ref 12.0–15.0)

## 2020-07-04 LAB — RESP PANEL BY RT-PCR (FLU A&B, COVID) ARPGX2
Influenza A by PCR: NEGATIVE
Influenza B by PCR: NEGATIVE
SARS Coronavirus 2 by RT PCR: NEGATIVE

## 2020-07-04 LAB — VITAMIN B12: Vitamin B-12: 1538 pg/mL — ABNORMAL HIGH (ref 180–914)

## 2020-07-04 LAB — HEMOGLOBIN AND HEMATOCRIT, BLOOD
HCT: 28.4 % — ABNORMAL LOW (ref 36.0–46.0)
Hemoglobin: 9.2 g/dL — ABNORMAL LOW (ref 12.0–15.0)

## 2020-07-04 LAB — BRAIN NATRIURETIC PEPTIDE: B Natriuretic Peptide: 825.3 pg/mL — ABNORMAL HIGH (ref 0.0–100.0)

## 2020-07-04 LAB — MAGNESIUM: Magnesium: 1.8 mg/dL (ref 1.7–2.4)

## 2020-07-04 LAB — TSH: TSH: 1.917 u[IU]/mL (ref 0.350–4.500)

## 2020-07-04 MED ORDER — DOXAZOSIN MESYLATE 2 MG PO TABS
2.0000 mg | ORAL_TABLET | Freq: Every day | ORAL | Status: DC
  Administered 2020-07-05 – 2020-07-06 (×2): 2 mg via ORAL
  Filled 2020-07-04 (×2): qty 1

## 2020-07-04 MED ORDER — POTASSIUM CHLORIDE CRYS ER 20 MEQ PO TBCR
20.0000 meq | EXTENDED_RELEASE_TABLET | Freq: Every day | ORAL | Status: DC
  Administered 2020-07-05 – 2020-07-06 (×2): 20 meq via ORAL
  Filled 2020-07-04 (×2): qty 1

## 2020-07-04 MED ORDER — METOPROLOL SUCCINATE ER 50 MG PO TB24
100.0000 mg | ORAL_TABLET | Freq: Two times a day (BID) | ORAL | Status: DC
  Administered 2020-07-04 – 2020-07-06 (×4): 100 mg via ORAL
  Filled 2020-07-04 (×4): qty 2

## 2020-07-04 MED ORDER — ATORVASTATIN CALCIUM 10 MG PO TABS
10.0000 mg | ORAL_TABLET | Freq: Every day | ORAL | Status: DC
  Administered 2020-07-05 – 2020-07-06 (×2): 10 mg via ORAL
  Filled 2020-07-04 (×2): qty 1

## 2020-07-04 MED ORDER — INSULIN ASPART 100 UNIT/ML ~~LOC~~ SOLN
0.0000 [IU] | Freq: Three times a day (TID) | SUBCUTANEOUS | Status: DC
  Administered 2020-07-05: 1 [IU] via SUBCUTANEOUS
  Administered 2020-07-05: 2 [IU] via SUBCUTANEOUS
  Administered 2020-07-06: 1 [IU] via SUBCUTANEOUS

## 2020-07-04 MED ORDER — TAMOXIFEN CITRATE 20 MG PO TABS: 20.0000 mg | ORAL_TABLET | Freq: Every day | ORAL | Status: DC

## 2020-07-04 MED ORDER — SODIUM CHLORIDE 0.9% FLUSH
3.0000 mL | Freq: Two times a day (BID) | INTRAVENOUS | Status: DC
  Administered 2020-07-05: 3 mL via INTRAVENOUS

## 2020-07-04 MED ORDER — ACETAMINOPHEN 650 MG RE SUPP: 650.0000 mg | Freq: Four times a day (QID) | RECTAL | Status: DC | PRN

## 2020-07-04 MED ORDER — ACETAMINOPHEN 325 MG PO TABS: 650.0000 mg | ORAL_TABLET | Freq: Four times a day (QID) | ORAL | Status: DC | PRN

## 2020-07-04 MED ORDER — HYDROCORTISONE ACETATE 25 MG RE SUPP
25.0000 mg | Freq: Two times a day (BID) | RECTAL | Status: DC
  Administered 2020-07-04 – 2020-07-06 (×3): 25 mg via RECTAL
  Filled 2020-07-04 (×5): qty 1

## 2020-07-04 MED ORDER — AMIODARONE HCL 200 MG PO TABS
200.0000 mg | ORAL_TABLET | Freq: Every day | ORAL | Status: DC
  Administered 2020-07-05 – 2020-07-06 (×2): 200 mg via ORAL
  Filled 2020-07-04 (×2): qty 1

## 2020-07-04 MED ORDER — FERROUS SULFATE 325 (65 FE) MG PO TABS
324.0000 mg | ORAL_TABLET | Freq: Two times a day (BID) | ORAL | Status: DC
  Administered 2020-07-04 – 2020-07-06 (×3): 324 mg via ORAL
  Filled 2020-07-04 (×4): qty 1

## 2020-07-04 MED ORDER — FUROSEMIDE 40 MG PO TABS
80.0000 mg | ORAL_TABLET | Freq: Two times a day (BID) | ORAL | Status: DC
  Administered 2020-07-05: 80 mg via ORAL
  Filled 2020-07-04: qty 2

## 2020-07-04 NOTE — Plan of Care (Signed)

## 2020-07-04 NOTE — ED Provider Notes (Signed)
[facility]PART[facility] Provider Note   CSN: [id number] Arrival date & time: 07/04/20  1156     History Chief Complaint  Patient presents with  . Weakness    Whitney [name] is a 87 y.o. female.  HPI  87 year old female history of type 2 diabetes, anticoagulated on Eliquis for atrial fibrillation presents today with reports of low hemoglobin.  Her granddaughter is with her and assists in the history.  Reports that she was seen by her primary care physician, Dr. Dennard Schaumann and told that her blood count was low.  She has had 1 episode of bloody stool.  She does endorse some dark stools.  She denies any headache, head injury, chest pain, dyspnea, abdominal pain, or UTI symptoms.     Past Medical History:  Diagnosis Date  . Anemia   . Atrial fibrillation ([place])   . Breast cancer ([place]) 10/2013   right upper outer  . Cancer ([place])    right breast  . Complication of anesthesia    slow to wake up  . Diabetes mellitus without complication ([place])    type 2  . Dysrhythmia 10/15   AF  . Former smoker   . Full dentures   . Hyperlipidemia   . Hypertension   . Multinodular goiter   . Osteoporosis   . Radiation    Right Breast  . Thyroid disease    hypothyroidism  . Wears glasses     Patient Active Problem List   Diagnosis Date Noted  . Chronic colitis 05/21/2020  . Tubular adenoma with multifocal HGD and foci 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 ([place]) 05/21/2020  . History of colon polyps 12/01/2019  . Colitis, acute 12/01/2019  . Diverticulosis 12/01/2019  . Stenosis colon ([facility]) 12/01/2019  . Abnormal colonoscopy 12/01/2019  . Anemia 10/25/2019  . Bright red blood per rectum 10/24/2019  . Chronic kidney disease, stage 3a ([place]) 10/24/2019  . Coagulopathy ([place])   . Osteoporosis   . Unilateral primary osteoarthritis, right knee 03/25/2018  . Chronic pain of right knee 05/25/2017  . Bacteremia 10/25/2016  . Transaminitis  10/23/2016  . Sinus bradycardia 10/23/2016  . Controlled diabetes mellitus type 2 with complications ([place])   . Paroxysmal atrial fibrillation ([facility])   . Chronic diastolic CHF (congestive heart failure) ([place])   . Pulmonary hypertension ([place])   . Acute renal failure superimposed on stage 2 chronic kidney disease ([place])   . Elevated liver enzymes   . Bacteremia due to Escherichia coli 12/30/2015  . UTI (urinary tract infection) 12/30/2015  . Acute blood loss anemia 02/25/2014  . Closed left hip fracture ([place]) 02/24/2014  . PAF (paroxysmal atrial fibrillation) ([place]) 01/30/2014  . Anticoagulated 01/30/2014  . Chronic diastolic CHF (congestive heart failure), NYHA class 2 ([place]) 12/10/2013  . Preoperative cardiovascular examination 12/10/2013  . Neutropenic fever ([place]) 12/08/2013  . Anemia of chronic disease 12/08/2013  . Thrombocytopenia ([place]) 12/08/2013  . Hypokalemia 12/08/2013  . Pancytopenia due to antineoplastic chemotherapy ([place]) 12/08/2013  . Hypothyroidism 12/08/2013  . Breast cancer of upper-outer quadrant of right female breast ([place]) 11/01/2013  . Essential hypertension, benign 12/01/2012  . Pure hypercholesterolemia 12/01/2012  . DM2 (diabetes mellitus, type 2) ([place]) 11/21/2012    Past Surgical History:  Procedure Laterality Date  . ABDOMINAL HYSTERECTOMY    . AXILLARY LYMPH NODE DISSECTION Right 01/09/2014   Procedure: RIGHT AXILLARY LYMPH NODE DISECTION;  Surgeon: [name], MD;  Location: [facility];  Service: General;  Laterality: Right;  . BIOPSY  10/26/2019   Procedure: BIOPSY;  Surgeon: Rush Landmark Telford Nab., MD;  Location: [facility];  Service: Gastroenterology;;  . BIOPSY  03/11/2020   Procedure: BIOPSY;  Surgeon: Irving Copas., MD;  Location: [facility];  Service: Gastroenterology;;  . BREAST LUMPECTOMY WITH RADIOACTIVE SEED LOCALIZATION Right 01/09/2014   Procedure: RIGHT BREAST SEED LOCALIZED LUMPECTOMY ;  Surgeon: [name], MD;  Location: [facility];  Service: General;  Laterality: Right;  . COLONOSCOPY WITH PROPOFOL N/A 10/26/2019   Procedure: COLONOSCOPY WITH PROPOFOL;  Surgeon: Rush Landmark Telford Nab., MD;  Location: [facility];  Service: Gastroenterology;  Laterality: N/A;  . COLONOSCOPY WITH PROPOFOL N/A 03/11/2020   Procedure: COLONOSCOPY WITH PROPOFOL- Ultraslim scope;  Surgeon: Irving Copas., MD;  Location: [facility];  Service: Gastroenterology;  Laterality: N/A;  . ENDOSCOPIC MUCOSAL RESECTION N/A 03/11/2020   Procedure: ENDOSCOPIC MUCOSAL RESECTION;  Surgeon: Rush Landmark Telford Nab., MD;  Location: [facility];  Service: Gastroenterology;  Laterality: N/A;  . ESOPHAGOGASTRODUODENOSCOPY (EGD) WITH PROPOFOL N/A 10/26/2019   Procedure: ESOPHAGOGASTRODUODENOSCOPY (EGD) WITH PROPOFOL;  Surgeon: Rush Landmark Telford Nab., MD;  Location: [facility];  Service: Gastroenterology;  Laterality: N/A;  . EYE SURGERY  12/22/2009   cataracts  . HEMOSTASIS CLIP PLACEMENT  10/26/2019   Procedure: HEMOSTASIS CLIP PLACEMENT;  Surgeon: Irving Copas., MD;  Location: [facility];  Service: Gastroenterology;;  marking for transverse lesion  . INTRAMEDULLARY (IM) NAIL INTERTROCHANTERIC Left 02/24/2014   Procedure: IM ROD LEFT HIP FX;  Surgeon: Alta Corning, MD;  Location: [facility];  Service: Orthopedics;  Laterality: Left;  . left distal radius fracture     2020  . POLYPECTOMY  10/26/2019   Procedure: POLYPECTOMY;  Surgeon: Mansouraty, Telford Nab., MD;  Location: [facility];  Service: Gastroenterology;;  . POLYPECTOMY  03/11/2020   Procedure: POLYPECTOMY;  Surgeon: Irving Copas., MD;  Location: [facility];  Service: Gastroenterology;;  . PORT-A-CATH REMOVAL Right 01/09/2014   Procedure: REMOVAL PORT-A-CATH;  Surgeon: [name], MD;  Location: [facility];  Service: General;  Laterality: Right;  . PORTACATH PLACEMENT N/A 11/15/2013   Procedure:  INSERTION PORT-A-CATH WITH ULTRA SOUND AND FLOROSCOPY;  Surgeon: [name], MD;  Location: [facility];  Service: General;  Laterality: N/A;  . SUBMUCOSAL TATTOO INJECTION  10/26/2019   Procedure: SUBMUCOSAL TATTOO INJECTION;  Surgeon: Irving Copas., MD;  Location: [facility];  Service: Gastroenterology;;  . TOTAL KNEE ARTHROPLASTY  2002   left     OB History   No obstetric history on file.     Family History  Problem Relation Age of Onset  . Colon cancer Neg Hx   . Esophageal cancer Neg Hx   . Inflammatory bowel disease Neg Hx   . Liver disease Neg Hx   . Pancreatic cancer Neg Hx   . Rectal cancer Neg Hx   . Stomach cancer Neg Hx     Social History   Tobacco Use  . Smoking status: Former Smoker    Packs/day: 1.00    Years: 5.00    Pack years: 5.00    Quit date: 11/14/1958    Years since quitting: 61.6  . Smokeless tobacco: Never Used  Vaping Use  . Vaping Use: Never used  Substance Use Topics  . Alcohol use: No    Alcohol/week: 0.0 standard drinks  . Drug use: No    Home Medications Prior to Admission medications   Medication Sig Start Date End Date Taking? Authorizing  Provider  amiodarone (PACERONE) 200 MG tablet Take 1 tablet (200 mg total) by mouth daily. 02/05/20   [name], MD  apixaban (ELIQUIS) 2.5 MG TABS tablet Take 1 tablet (2.5 mg total) by mouth 2 (two) times daily. Provider has put Patient on 2.5 mg twice daily. 03/14/20   Mansouraty, Telford Nab., MD  atorvastatin (LIPITOR) 10 MG tablet Take 1 tablet (10 mg total) by mouth daily. 05/03/20   [name], MD  Blood Glucose Monitoring Suppl (ONETOUCH VERIO) w/Device KIT Use to test blood sugars daily. 11/23/17   Elayne Snare, MD  cholecalciferol (VITAMIN D3) 25 MCG (1000 UNIT) tablet TAKE 1 TAB BY MOUTH EVERY DAY 06/03/20   [name], MD  docusate sodium (COLACE) 100 MG capsule Take 100 mg by mouth daily.    [provider]  doxazosin (CARDURA) 4 MG tablet Take 0.5  tablets (2 mg total) by mouth daily. 10/30/19   Rai, Ripudeep K, MD  ferrous sulfate 324 (65 Fe) MG TBEC TAKE 1 TABLET BY MOUTH IN THE MORNING AND 1 AT BEDTIME 05/07/20   [name], MD  furosemide (LASIX) 40 MG tablet Take 2 tablets (80 mg total) by mouth daily. Patient taking differently: Take 80 mg by mouth 2 (two) times daily. 02/06/20   [name], MD  JANUVIA 100 MG tablet TAKE 0.5 TAB BY MOUTH DAILY 06/03/20   [name], MD  KLOR-CON M20 20 MEQ tablet TAKE 1 TABLET BY MOUTH EVERY DAY 06/03/20   Elayne Snare, MD  meclizine (ANTIVERT) 12.5 MG tablet TAKE 1 TABLET BY MOUTH DAILY AS NEEDED FOR DIZZINESS 06/03/20   [name], MD  metoprolol succinate (TOPROL-XL) 100 MG 24 hr tablet TAKE 1 TABLET BY MOUTH TWICE A DAY WITH OR IMMEDIATELY FOLLOWING A MEAL 02/05/20   Elayne Snare, MD  [facility] VERIO test strip CHECK BLOOD SUGAR EVERY DAY AS DIRECTED 02/05/20   Elayne Snare, MD  polyethylene glycol (MIRALAX / GLYCOLAX) 17 g packet Take 17 g by mouth daily. Also available OTC 10/31/19   Rai, Ripudeep K, MD  tamoxifen (NOLVADEX) 20 MG tablet TAKE 1 TABLET BY MOUTH EVERY DAY 03/25/20   Nicholas Lose, MD    Allergies    Tape  Review of Systems   Review of Systems  All other systems reviewed and are negative.   Physical Exam Updated Vital Signs BP 100/87 (BP Location: Right Arm)   Pulse 85   Temp 98.2 F (36.8 C)   Resp 14   LMP  (LMP Unknown)   SpO2 96%   Physical Exam Vitals and nursing note reviewed.  Constitutional:      General: She is not in acute distress.    Appearance: Normal appearance.  HENT:     Head: Normocephalic.     Right Ear: External ear normal.     Left Ear: External ear normal.     Nose: Nose normal.     Mouth/Throat:     Mouth: Mucous membranes are moist.  Eyes:     Pupils: Pupils are equal, round, and reactive to light.  Cardiovascular:     Rate and Rhythm: Normal rate.     Pulses: Normal pulses.  Pulmonary:     Effort: Pulmonary  effort is normal.     Breath sounds: Normal breath sounds.  Abdominal:     General: Abdomen is flat. There is no distension.     Palpations: Abdomen is soft.  Genitourinary:    Comments: Rectal  exam performed no masses noted Dark tarry stool noted Musculoskeletal:        General: Normal range of motion.     Cervical back: Normal range of motion.     Right lower leg: Edema present.     Left lower leg: Edema present.  Skin:    General: Skin is warm and dry.     Capillary Refill: Capillary refill takes less than 2 seconds.  Neurological:     Mental Status: She is alert.  Psychiatric:        Mood and Affect: Mood normal.     ED Results / Procedures / Treatments   Labs (all labs ordered are listed, but only abnormal results are displayed) Labs Reviewed  RESP PANEL BY RT-PCR (FLU A&B, COVID) ARPGX2  BASIC METABOLIC PANEL  CBC  HEPATIC FUNCTION PANEL  PROTIME-INR  POC OCCULT BLOOD, ED  POC OCCULT BLOOD, ED  TYPE AND SCREEN    EKG None  Radiology No results found.  Procedures .Critical Care Performed by: Pattricia Boss, MD Authorized by: Pattricia Boss, MD   Critical care provider statement:    Critical care time (minutes):  45   Critical care end time:  07/04/2020 2:59 PM   Critical care was time spent personally by me on the following activities:  Discussions with consultants, evaluation of patient's response to treatment, examination of patient, ordering and performing treatments and interventions, ordering and review of laboratory studies, ordering and review of radiographic studies, pulse oximetry, re-evaluation of patient's condition, obtaining history from patient or surrogate and review of old charts     Medications Ordered in ED Medications - No data to display  ED Course  I have reviewed the triage vital signs and the nursing notes.  Pertinent labs & imaging results that were available during my care of the patient were reviewed by me and considered in my  medical decision making (see chart for details).  Clinical Course as of 07/04/20 1456  Thu Jul 04, 2020  1407 Female and Hemoccult reviewed Hepatic function panel reviewed [DR]  1407 DG Chest Port 1 View Chest x-Jaser Fullen interpretation reviewed and visualized [DR]    Clinical Course User Index [DR] Pattricia Boss, MD   MDM Rules/Calculators/A&P                         From Dr. Roxy Cedar office note (March) Ms. Pires is a 87 y.o. female with a pmh significant for atrial fibrillation (on anticoagulation), prior breast cancer, hypertension, hyperlipidemia, hypothyroidism, anemia, severe diverticulosis, colon polyps, transverse colon polyp with high-grade dysplasia concerning for potential focus of adenocarcinoma.  The patient is seen today for evaluation and management of:   1. Tubular adenoma with multifocal HGD and foci 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 ([place])   2. Chronic colitis   3. Hx of adenomatous colonic polyps   4. Stenosis colon ([place])   5. Diverticulosis   6. Abnormal colonoscopy     87 year old female history of A. fib on anticoagulation history of Vio adenoma presents today with rectal bleeding.  No pain is noted on exam.  Dark tarry stool that is heme positive is noted.  Hemoglobin is 9.9 and patient is hemodynamically stable.  Plan consult to hospitalist and GI. Discussed with Dr. Tamala Julian who will see patient for admission Discussed with Dr. Silverio Decamp, on-call for [place] who will see patient in consult Final Clinical Impression(s) / ED Diagnoses Final diagnoses:  Gastrointestinal hemorrhage, unspecified gastrointestinal  hemorrhage type  Chronic anticoagulation    Rx / DC Orders ED Discharge Orders    None       Pattricia Boss, MD 07/04/20 [phone]

## 2020-07-04 NOTE — H&P (Signed)
History and Physical    Whitney [name] PJA:[id number] DOB: Sep 09, 1927 DOA: 07/04/2020  Referring MD/NP/PA: Pryor Curia, MD PCP: [name], MD  Patient coming from: From PCP office  Chief Complaint: Rectal bleeding  I have personally briefly reviewed patient's old medical records in [place]   HPI: Whitney [name] is a 87 y.o. female with medical history significant of hypertension, hyperlipidemia, atrial fibrillation on Eliquis, history breast cancer s/p lumpectomy, diabetes mellitus type 2, hypothyroidism, and tubular adenoma of the colon s/p removal 02/2020 presents with complaints of rectal bleeding for the last 2 days.  Patient's granddaughter who acts as a caretaker is present and helps provide history as the patient is hard of hearing.  Apparently after she had gotten up from her shower stool while taking a bath it was covered in blood.  Since that time she has been intermittently spotting blood and stools have been reported to be dark.  Patient is on iron supplements.  She followed up with her primary care office today and hemoglobin on fingerstick was noted to be 6 with repeat 7.4 and was sent to the hospital for further evaluation.  She does report having lower extremity swelling, but granddaughter notes it is not changed from baseline.  Patient denies any complaints of shortness of breath, cough, fever, abdominal pain, or dysuria.  During patient's last colonoscopy back in December she was noted to have internal hemorrhoids at that time.  She reports lasting taking Eliquis this morning prior to her doctor's appointment.  ED Course: Upon admission into the emergency department patient was seen to be afebrile, pulse 14-24, blood pressures 100/87 and 153/98, and all other vital signs maintain.  Labs significant for WBC 3.5, hemoglobin 9.9(last available hemoglobin 10.3 from 01/29/2020), BUN 29, creatinine 1.56, glucose 170, and INR 1.7.  Stool guaiacs were noted to be  positive.  Influenza and COVID-19 screening were both negative.  Interval improvement of left basilar aeration with persistent right base collapse/consolidation with small right pleural effusion.  Review of Systems  Constitutional: Negative for fever and malaise/fatigue.  HENT: Positive for hearing loss.   Eyes: Negative for photophobia and pain.  Respiratory: Negative for cough and shortness of breath.   Cardiovascular: Positive for leg swelling. Negative for chest pain.  Gastrointestinal: Positive for blood in stool. Negative for abdominal pain, nausea and vomiting.  Genitourinary: Negative for dysuria.  Musculoskeletal: Negative for falls.  Neurological: Negative for focal weakness and loss of consciousness.  Psychiatric/Behavioral: Positive for memory loss (Mild). Negative for substance abuse.    Past Medical History:  Diagnosis Date  . Anemia   . Atrial fibrillation ([place])   . Breast cancer ([place]) 10/2013   right upper outer  . Cancer ([place])    right breast  . Complication of anesthesia    slow to wake up  . Diabetes mellitus without complication ([place])    type 2  . Dysrhythmia 10/15   AF  . Former smoker   . Full dentures   . Hyperlipidemia   . Hypertension   . Multinodular goiter   . Osteoporosis   . Radiation    Right Breast  . Thyroid disease    hypothyroidism  . Wears glasses     Past Surgical History:  Procedure Laterality Date  . ABDOMINAL HYSTERECTOMY    . AXILLARY LYMPH NODE DISSECTION Right 01/09/2014   Procedure: RIGHT AXILLARY LYMPH NODE DISECTION;  Surgeon: [name], MD;  Location: [place];  Service: General;  Laterality: Right;  . BIOPSY  10/26/2019   Procedure: BIOPSY;  Surgeon: Rush Landmark Telford Nab., MD;  Location: [facility];  Service: Gastroenterology;;  . BIOPSY  03/11/2020   Procedure: BIOPSY;  Surgeon: Irving Copas., MD;  Location: [place];  Service: Gastroenterology;;  . BREAST LUMPECTOMY WITH  RADIOACTIVE SEED LOCALIZATION Right 01/09/2014   Procedure: RIGHT BREAST SEED LOCALIZED LUMPECTOMY ;  Surgeon: [name], MD;  Location: [place];  Service: General;  Laterality: Right;  . COLONOSCOPY WITH PROPOFOL N/A 10/26/2019   Procedure: COLONOSCOPY WITH PROPOFOL;  Surgeon: Rush Landmark Telford Nab., MD;  Location: [facility];  Service: Gastroenterology;  Laterality: N/A;  . COLONOSCOPY WITH PROPOFOL N/A 03/11/2020   Procedure: COLONOSCOPY WITH PROPOFOL- Ultraslim scope;  Surgeon: Irving Copas., MD;  Location: [place];  Service: Gastroenterology;  Laterality: N/A;  . ENDOSCOPIC MUCOSAL RESECTION N/A 03/11/2020   Procedure: ENDOSCOPIC MUCOSAL RESECTION;  Surgeon: Rush Landmark Telford Nab., MD;  Location: [place];  Service: Gastroenterology;  Laterality: N/A;  . ESOPHAGOGASTRODUODENOSCOPY (EGD) WITH PROPOFOL N/A 10/26/2019   Procedure: ESOPHAGOGASTRODUODENOSCOPY (EGD) WITH PROPOFOL;  Surgeon: Rush Landmark Telford Nab., MD;  Location: [facility];  Service: Gastroenterology;  Laterality: N/A;  . EYE SURGERY  12/22/2009   cataracts  . HEMOSTASIS CLIP PLACEMENT  10/26/2019   Procedure: HEMOSTASIS CLIP PLACEMENT;  Surgeon: Irving Copas., MD;  Location: [facility];  Service: Gastroenterology;;  marking for transverse lesion  . INTRAMEDULLARY (IM) NAIL INTERTROCHANTERIC Left 02/24/2014   Procedure: IM ROD LEFT HIP FX;  Surgeon: Alta Corning, MD;  Location: [place] [facility];  Service: Orthopedics;  Laterality: Left;  . left distal radius fracture     2020  . POLYPECTOMY  10/26/2019   Procedure: POLYPECTOMY;  Surgeon: Mansouraty, Telford Nab., MD;  Location: [facility];  Service: Gastroenterology;;  . POLYPECTOMY  03/11/2020   Procedure: POLYPECTOMY;  Surgeon: Irving Copas., MD;  Location: [facility];  Service: Gastroenterology;;  . PORT-A-CATH REMOVAL Right 01/09/2014   Procedure: REMOVAL PORT-A-CATH;  Surgeon: [name], MD;  Location: [facility];  Service: General;  Laterality: Right;  . PORTACATH PLACEMENT N/A 11/15/2013   Procedure: INSERTION PORT-A-CATH WITH ULTRA SOUND AND FLOROSCOPY;  Surgeon: [name], MD;  Location: [facility];  Service: General;  Laterality: N/A;  . SUBMUCOSAL TATTOO INJECTION  10/26/2019   Procedure: SUBMUCOSAL TATTOO INJECTION;  Surgeon: Irving Copas., MD;  Location: [facility];  Service: Gastroenterology;;  . TOTAL KNEE ARTHROPLASTY  2002   left     reports that she quit smoking about 61 years ago. She has a 5.00 pack-year smoking history. She has never used smokeless tobacco. She reports that she does not drink alcohol and does not use drugs.  Allergies  Allergen Reactions  . Tape Other (See Comments)    Skin is somewhat sensitive    Family History  Problem Relation Age of Onset  . Colon cancer Neg Hx   . Esophageal cancer Neg Hx   . Inflammatory bowel disease Neg Hx   . Liver disease Neg Hx   . Pancreatic cancer Neg Hx   . Rectal cancer Neg Hx   . Stomach cancer Neg Hx     Prior to Admission medications   Medication Sig Start Date End Date Taking? Authorizing Provider  amiodarone (PACERONE) 200 MG tablet Take 1 tablet (200 mg total) by mouth daily. 02/05/20   [name], MD  apixaban (ELIQUIS) 2.5 MG TABS tablet Take 1 tablet (2.5 mg total) by mouth 2 (  two) times daily. Provider has put Patient on 2.5 mg twice daily. 03/14/20   Mansouraty, Telford Nab., MD  atorvastatin (LIPITOR) 10 MG tablet Take 1 tablet (10 mg total) by mouth daily. 05/03/20   [name], MD  Blood Glucose Monitoring Suppl (ONETOUCH VERIO) w/Device KIT Use to test blood sugars daily. 11/23/17   Elayne Snare, MD  cholecalciferol (VITAMIN D3) 25 MCG (1000 UNIT) tablet TAKE 1 TAB BY MOUTH EVERY DAY 06/03/20   [name], MD  docusate sodium (COLACE) 100 MG capsule Take 100 mg by mouth daily.    [provider]  doxazosin (CARDURA) 4 MG tablet Take 0.5 tablets (2 mg total)  by mouth daily. 10/30/19   Rai, Ripudeep K, MD  ferrous sulfate 324 (65 Fe) MG TBEC TAKE 1 TABLET BY MOUTH IN THE MORNING AND 1 AT BEDTIME 05/07/20   [name], MD  furosemide (LASIX) 40 MG tablet Take 2 tablets (80 mg total) by mouth daily. Patient taking differently: Take 80 mg by mouth 2 (two) times daily. 02/06/20   [name], MD  JANUVIA 100 MG tablet TAKE 0.5 TAB BY MOUTH DAILY 06/03/20   [name], MD  KLOR-CON M20 20 MEQ tablet TAKE 1 TABLET BY MOUTH EVERY DAY 06/03/20   Elayne Snare, MD  meclizine (ANTIVERT) 12.5 MG tablet TAKE 1 TABLET BY MOUTH DAILY AS NEEDED FOR DIZZINESS 06/03/20   [name], MD  metoprolol succinate (TOPROL-XL) 100 MG 24 hr tablet TAKE 1 TABLET BY MOUTH TWICE A DAY WITH OR IMMEDIATELY FOLLOWING A MEAL 02/05/20   Elayne Snare, MD  [facility] VERIO test strip CHECK BLOOD SUGAR EVERY DAY AS DIRECTED 02/05/20   Elayne Snare, MD  polyethylene glycol (MIRALAX / GLYCOLAX) 17 g packet Take 17 g by mouth daily. Also available OTC 10/31/19   Rai, Vernelle Emerald, MD  tamoxifen (NOLVADEX) 20 MG tablet TAKE 1 TABLET BY MOUTH EVERY DAY 03/25/20   Nicholas Lose, MD    Physical Exam:  Constitutional: Elderly female currently no acute distress Vitals:   07/04/20 1300 07/04/20 1330 07/04/20 1415 07/04/20 1500  BP: (!) 116/97 138/86 131/75 (!) 153/98  Pulse: 80 98 86 96  Resp: (!) 22 17 (!) 21 20  Temp:      SpO2: 98% 97% 97% 98%   Eyes: PERRL, lids and conjunctivae normal ENMT: Mucous membranes are moist. Posterior pharynx clear of any exudate or lesions. Hard of hearing. Neck: normal, supple, no masses, no thyromegaly Respiratory: Decreased aeration noted in lung fields but no significant wheezes or rhonchi appreciated.  Patient maintaining O2 saturations on room air. Cardiovascular: Regular rate and rhythm, no murmurs / rubs / gallops.  Legs +1 pitting bilateral lower extremity edema. 2+ pedal pulses. No carotid bruits.  Abdomen: no tenderness, no masses  palpated. No hepatosplenomegaly. Bowel sounds positive.  Musculoskeletal: no clubbing / cyanosis. No joint deformity upper and lower extremities. Good ROM, no contractures. Normal muscle tone.  Skin: no rashes, lesions, ulcers. No induration Neurologic: CN 2-12 grossly intact. Sensation intact, DTR normal. Strength 5/5 in all 4.  Psychiatric: Normal judgment and insight. Alert and oriented x 3. Normal mood.     Labs on Admission: I have personally reviewed following labs and imaging studies  CBC: Recent Labs  Lab 07/04/20 1420  WBC 3.5*  HGB 9.9*  HCT 30.5*  MCV 110.1*  PLT 371   Basic Metabolic Panel: Recent Labs  Lab 07/04/20 1203  NA 139  K 3.8  CL 105  CO2 25  GLUCOSE 170*  BUN 29*  CREATININE 1.56*  CALCIUM 8.5*   GFR: CrCl cannot be calculated (Unknown ideal weight.). Liver Function Tests: Recent Labs  Lab 07/04/20 1203  AST 43*  ALT 32  ALKPHOS 34*  BILITOT 0.8  PROT 5.2*  ALBUMIN 2.8*   No results for input(s): LIPASE, AMYLASE in the last 168 hours. No results for input(s): AMMONIA in the last 168 hours. Coagulation Profile: Recent Labs  Lab 07/04/20 1238  INR 1.7*   Cardiac Enzymes: No results for input(s): CKTOTAL, CKMB, CKMBINDEX, TROPONINI in the last 168 hours. BNP (last 3 results) No results for input(s): PROBNP in the last 8760 hours. HbA1C: No results for input(s): HGBA1C in the last 72 hours. CBG: No results for input(s): GLUCAP in the last 168 hours. Lipid Profile: No results for input(s): CHOL, HDL, LDLCALC, TRIG, CHOLHDL, LDLDIRECT in the last 72 hours. Thyroid Function Tests: No results for input(s): TSH, T4TOTAL, FREET4, T3FREE, THYROIDAB in the last 72 hours. Anemia Panel: No results for input(s): VITAMINB12, FOLATE, FERRITIN, TIBC, IRON, RETICCTPCT in the last 72 hours. Urine analysis:    Component Value Date/Time   COLORURINE STRAW (A) 07/05/2018 1945   APPEARANCEUR CLEAR 07/05/2018 1945   LABSPEC 1.006 07/05/2018 1945    PHURINE 7.0 07/05/2018 1945   GLUCOSEU NEGATIVE 07/05/2018 1945   GLUCOSEU NEGATIVE 11/24/2012 0831   HGBUR NEGATIVE 07/05/2018 1945   BILIRUBINUR NEGATIVE 07/05/2018 1945   BILIRUBINUR neg 09/19/2014 [place] 07/05/2018 1945   PROTEINUR NEGATIVE 07/05/2018 1945   UROBILINOGEN 0.2 09/19/2014 1605   UROBILINOGEN 0.2 02/24/2014 0024   NITRITE NEGATIVE 07/05/2018 1945   LEUKOCYTESUR NEGATIVE 07/05/2018 1945   Sepsis Labs: Recent Results (from the past 240 hour(s))  Resp Panel by RT-PCR (Flu A&B, Covid) Nasopharyngeal Swab     Status: None   Collection Time: 07/04/20 12:39 PM   Specimen: Nasopharyngeal Swab; Nasopharyngeal(NP) swabs in vial transport medium  Result Value Ref Range Status   SARS Coronavirus 2 by RT PCR NEGATIVE NEGATIVE Final    Comment: (NOTE) SARS-CoV-2 target nucleic acids are NOT DETECTED.  The SARS-CoV-2 RNA is generally detectable in upper respiratory specimens during the acute phase of infection. The lowest concentration of SARS-CoV-2 viral copies this assay can detect is 138 copies/mL. A negative result does not preclude SARS-Cov-2 infection and should not be used as the sole basis for treatment or other patient management decisions. A negative result may occur with  improper specimen collection/handling, submission of specimen other than nasopharyngeal swab, presence of viral mutation(s) within the areas targeted by this assay, and inadequate number of viral copies(<138 copies/mL). A negative result must be combined with clinical observations, patient history, and epidemiological information. The expected result is Negative.  Fact Sheet for Patients:  EntrepreneurPulse.com.au  Fact Sheet for Healthcare Providers:  IncredibleEmployment.be  This test is no t yet approved or cleared by the [place] FDA and  has been authorized for detection and/or diagnosis of SARS-CoV-2 by FDA under an Emergency  Use Authorization (EUA). This EUA will remain  in effect (meaning this test can be used) for the duration of the COVID-19 declaration under Section 564(b)(1) of the Act, 21 U.S.C.section 360bbb-3(b)(1), unless the authorization is terminated  or revoked sooner.       Influenza A by PCR NEGATIVE NEGATIVE Final   Influenza B by PCR NEGATIVE NEGATIVE Final    Comment: (NOTE) The Xpert Xpress SARS-CoV-2/FLU/RSV plus assay is intended as an  aid in the diagnosis of influenza from Nasopharyngeal swab specimens and should not be used as a sole basis for treatment. Nasal washings and aspirates are unacceptable for Xpert Xpress SARS-CoV-2/FLU/RSV testing.  Fact Sheet for Patients: EntrepreneurPulse.com.au  Fact Sheet for Healthcare Providers: IncredibleEmployment.be  This test is not yet approved or cleared by the [place] FDA and has been authorized for detection and/or diagnosis of SARS-CoV-2 by FDA under an Emergency Use Authorization (EUA). This EUA will remain in effect (meaning this test can be used) for the duration of the COVID-19 declaration under Section 564(b)(1) of the Act, 21 U.S.C. section 360bbb-3(b)(1), unless the authorization is terminated or revoked.  Performed at [facility], [place] [address], [place], [place] [address]      Radiological Exams on Admission: DG Chest Port 1 View  Result Date: 07/04/2020 CLINICAL DATA:  Anemia. EXAM: PORTABLE CHEST 1 VIEW COMPARISON:  06/10/2020 FINDINGS: 1322 hours. The cardio pericardial silhouette is enlarged. Right base collapse/consolidation with small right pleural effusion, similar to prior. Airspace disease with left fusion seen at the left base previously has almost resolved in the interval. There is pulmonary vascular congestion without overt pulmonary edema. Bones are diffusely demineralized. Telemetry leads overlie the chest. IMPRESSION: 1. Interval improvement in left base  aeration. 2. Persistent right base collapse/consolidation with small right pleural effusion. Electronically Signed   By: Misty Stanley M.D.   On: 07/04/2020 13:49    EKG: Independently reviewed.  Atrial fibrillation 83 bpm with QTC 706 she is  Assessment/Plan Rectal bleeding macrocytic anemia: Acute.  Patient reported having painless rectal bleeding over the last 2 days.  At PCP office hemoglobin was reported to be as low as 6 g/dL and reason patient was sent to the hospital for further evaluation.  However, hemoglobin 9.9 g/dL on admission with elevated MCV and MCH.  Last available hemoglobin was 10.3 back in 01/2020, but baseline previously had been around 9.  She last took Eliquis this morning.  Colonoscopy had previously noted   hemorrhoids, chronic colitis, and diverticulosis.  Question if symptoms secondary to hemorrhoids vs possible diverticular bleed. -Admit to a medical telemetry bed -Hold Eliquis -Check vitamin B12 and folate -Heart healthy carb modified diet as tolerated -Hydrocortisone suppositories twice daily per GI recommendation -Serial monitoring of H&H transfuse blood products for hemoglobin less than 8 g/dL or patient -Appreciate GI consultative services, will follow for further recommendations  History of tubular adenoma of the transverse colon: Patient is followed by [name] of GI.  They had attempted piecemeal resection of the tubular adenoma in December 2021.  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  Patient has had follow-up  since that time, but they deferred repeat colonoscopy given patient's age at that point in time.  Paroxysmal atrial fibrillation on chronic anticoagulation: Patient appears to be rate controlled at this time.  She last took Eliquis this morning.CHA2DS2-VASc score = 6 (based off of age, gender, CHF history, HTN, and DM 2). -Hold Eliquis -Continue amiodarone and metoprolol as tolerated  Diastolic CHF:  Last echocardiogram revealed EF of 60 to 65% with grade 2 diastolic dysfunction back in 2015.  Patient with at least 1-2+ pitting edema bilateral lower extremities.  To be grossly fluid overloaded at this time.  Chest x-ray did note right lung collapse with consolidation.  Small right-sided pleural effusion but interval clearing of the left lung base. -Strict intake and output -Daily weights -Add-on a BNP  Leukopenia: Acute on chronic.  WBC 3.5, but similar seen previously in the past.  Patient afebrile at this time. -Continue to monitor  Chronic kidney disease stage IIIb: On admission creatinine 1.56 with BUN 29.  Creatinine appears similar to previous when checked earlier this month.  Question of possibility of like kidney injury as baseline previously had been around 1.2. -Check urine sodium, urine creatinine, and urine urea  -Continue to monitor kidney function  Prolonged QT interval: QTC significantly prolonged at 706. -Hold QT prolonging medications  Essential hypertension: Home blood pressure medication regimen includes Cardura 2 mg daily, furosemide 80 mg twice daily, and metoprolol 100 mg twice daily.  -Continue current home regimen as tolerated  Diabetes mellitus type 2, well controlled: On admission patient's glucose elevated up to 170.  Last hemoglobin A1c was 5.8 on 01/19/2020.  Home medications include Januvia 50 mg daily. -Hypoglycemic protocols -Hold Januvia -CBGs before every meal with sensitive SSI -Adjust insulin regimen as needed  Hypothyroidism: Last TSH 1.27 on 01/19/2020. -Recheck TSH -Continue levothyroxine  Hyperlipidemia -Continue atorvastatin  History of breast cancer: Patient is status post right lumpectomy. -held  tamoxifen due to prolonged QT    DVT prophylaxis: SCDs Code Status: Full Family Communication: Daughter updated at bedside Disposition Plan: Hopefully discharge home in a.m. if everything appears stable Consults called: [place]  GI Admission status: Observation  Norval Morton MD [facility]   If 7PM-7AM, please contact night-coverage   07/04/2020, 3:20 PM

## 2020-07-04 NOTE — Progress Notes (Signed)
Subjective:    Patient ID: Whitney [name], female    DOB: 03/11/1928, 87 y.o.   MRN: [id number]   Patient had a colonoscopy December 27 which revealed a 40 mm large polyp that was biopsied.  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  Patient was given the option over a partial colectomy versus rechecking in 3 to 4 months with a colonoscopy to see if the lesion had been removed in its entirety.  Hemoglobin at that time was 10.3.  During the colonoscopy she was also found to have internal hemorrhoids.  She is on Eliquis for anticoagulation due to atrial fibrillation.  Tuesday, the patient was taking a bath.  When she stood up and got off of her shower seat, the seat was covered in blood.  Her granddaughter estimates that it was a cupful of blood all over the shower seat.  Since that time blood has been painlessly dripping from her rectum every time she has to go to the bathroom.  She also reports dark-colored stools.  Hemoglobin on fingerstick today was initially found to be 6.  We repeated it and found it to be 7.4.  Therefore I estimate that her hemoglobin is 7.  This is an acute drop from 10.3.  She is still taking her Eliquis and she is still seeing blood in the stool.  Past Surgical History:  Procedure Laterality Date  . ABDOMINAL HYSTERECTOMY    . AXILLARY LYMPH NODE DISSECTION Right 01/09/2014   Procedure: RIGHT AXILLARY LYMPH NODE DISECTION;  Surgeon: [name], MD;  Location: [place];  Service: General;  Laterality: Right;  . BIOPSY  10/26/2019   Procedure: BIOPSY;  Surgeon: Rush Landmark Telford Nab., MD;  Location: [facility];  Service: Gastroenterology;;  . BIOPSY  03/11/2020   Procedure: BIOPSY;  Surgeon: Irving Copas., MD;  Location: [place];  Service: Gastroenterology;;  . BREAST LUMPECTOMY WITH RADIOACTIVE SEED LOCALIZATION Right 01/09/2014   Procedure: RIGHT BREAST SEED LOCALIZED LUMPECTOMY ;   Surgeon: [name], MD;  Location: [place];  Service: General;  Laterality: Right;  . COLONOSCOPY WITH PROPOFOL N/A 10/26/2019   Procedure: COLONOSCOPY WITH PROPOFOL;  Surgeon: Rush Landmark Telford Nab., MD;  Location: [facility];  Service: Gastroenterology;  Laterality: N/A;  . COLONOSCOPY WITH PROPOFOL N/A 03/11/2020   Procedure: COLONOSCOPY WITH PROPOFOL- Ultraslim scope;  Surgeon: Irving Copas., MD;  Location: [place];  Service: Gastroenterology;  Laterality: N/A;  . ENDOSCOPIC MUCOSAL RESECTION N/A 03/11/2020   Procedure: ENDOSCOPIC MUCOSAL RESECTION;  Surgeon: Rush Landmark Telford Nab., MD;  Location: [place];  Service: Gastroenterology;  Laterality: N/A;  . ESOPHAGOGASTRODUODENOSCOPY (EGD) WITH PROPOFOL N/A 10/26/2019   Procedure: ESOPHAGOGASTRODUODENOSCOPY (EGD) WITH PROPOFOL;  Surgeon: Rush Landmark Telford Nab., MD;  Location: [facility];  Service: Gastroenterology;  Laterality: N/A;  . EYE SURGERY  12/22/2009   cataracts  . HEMOSTASIS CLIP PLACEMENT  10/26/2019   Procedure: HEMOSTASIS CLIP PLACEMENT;  Surgeon: Irving Copas., MD;  Location: [facility];  Service: Gastroenterology;;  marking for transverse lesion  . INTRAMEDULLARY (IM) NAIL INTERTROCHANTERIC Left 02/24/2014   Procedure: IM ROD LEFT HIP FX;  Surgeon: Alta Corning, MD;  Location: [place];  Service: Orthopedics;  Laterality: Left;  . left distal radius fracture     2020  . POLYPECTOMY  10/26/2019   Procedure: POLYPECTOMY;  Surgeon: Mansouraty, Telford Nab., MD;  Location: [facility];  Service: Gastroenterology;;  . POLYPECTOMY  03/11/2020   Procedure: POLYPECTOMY;  Surgeon: Rush Landmark,  Telford Nab., MD;  Location: [facility];  Service: Gastroenterology;;  . PORT-A-CATH REMOVAL Right 01/09/2014   Procedure: REMOVAL PORT-A-CATH;  Surgeon: [name], MD;  Location: [place];  Service: General;  Laterality: Right;  . PORTACATH PLACEMENT N/A 11/15/2013    Procedure: INSERTION PORT-A-CATH WITH ULTRA SOUND AND FLOROSCOPY;  Surgeon: [name], MD;  Location: [place];  Service: General;  Laterality: N/A;  . SUBMUCOSAL TATTOO INJECTION  10/26/2019   Procedure: SUBMUCOSAL TATTOO INJECTION;  Surgeon: Irving Copas., MD;  Location: [facility];  Service: Gastroenterology;;  . TOTAL KNEE ARTHROPLASTY  2002   left   Current Outpatient Medications on File Prior to Visit  Medication Sig Dispense Refill  . amiodarone (PACERONE) 200 MG tablet Take 1 tablet (200 mg total) by mouth daily. 90 tablet 3  . apixaban (ELIQUIS) 2.5 MG TABS tablet Take 1 tablet (2.5 mg total) by mouth 2 (two) times daily. Provider has put Patient on 2.5 mg twice daily. 90 tablet 3  . atorvastatin (LIPITOR) 10 MG tablet Take 1 tablet (10 mg total) by mouth daily. 90 tablet 1  . Blood Glucose Monitoring Suppl (ONETOUCH VERIO) w/Device KIT Use to test blood sugars daily. 1 kit 0  . cholecalciferol (VITAMIN D3) 25 MCG (1000 UNIT) tablet TAKE 1 TAB BY MOUTH EVERY DAY 100 tablet 1  . docusate sodium (COLACE) 100 MG capsule Take 100 mg by mouth daily.    Marland Kitchen doxazosin (CARDURA) 4 MG tablet Take 0.5 tablets (2 mg total) by mouth daily.    . ferrous sulfate 324 (65 Fe) MG TBEC TAKE 1 TABLET BY MOUTH IN THE MORNING AND 1 AT BEDTIME 180 tablet 1  . furosemide (LASIX) 40 MG tablet Take 2 tablets (80 mg total) by mouth daily. (Patient taking differently: Take 80 mg by mouth 2 (two) times daily.) 90 tablet 2  . JANUVIA 100 MG tablet TAKE 0.5 TAB BY MOUTH DAILY 45 tablet 3  . KLOR-CON M20 20 MEQ tablet TAKE 1 TABLET BY MOUTH EVERY DAY 90 tablet 1  . meclizine (ANTIVERT) 12.5 MG tablet TAKE 1 TABLET BY MOUTH DAILY AS NEEDED FOR DIZZINESS 30 tablet 2  . metoprolol succinate (TOPROL-XL) 100 MG 24 hr tablet TAKE 1 TABLET BY MOUTH TWICE A DAY WITH OR IMMEDIATELY FOLLOWING A MEAL 180 tablet 1  . ONETOUCH VERIO test strip CHECK BLOOD SUGAR EVERY DAY AS DIRECTED 100 strip 2  . polyethylene glycol  (MIRALAX / GLYCOLAX) 17 g packet Take 17 g by mouth daily. Also available OTC 30 each 0  . tamoxifen (NOLVADEX) 20 MG tablet TAKE 1 TABLET BY MOUTH EVERY DAY 90 tablet 3   Current Facility-Administered Medications on File Prior to Visit  Medication Dose Route Frequency Provider Last Rate Last Admin  . denosumab (PROLIA) injection 60 mg  60 mg Subcutaneous Q6 months [name], MD   60 mg at 04/19/20 1135   Allergies  Allergen Reactions  . Tape Other (See Comments)    Skin is somewhat sensitive   Social History   Socioeconomic History  . Marital status: Widowed    Spouse name: Not on file  . Number of children: 4  . Years of education: Not on file  . Highest education level: Not on file  Occupational History  . Not on file  Tobacco Use  . Smoking status: Former Smoker    Packs/day: 1.00    Years: 5.00    Pack years: 5.00    Quit date: 11/14/1958  Years since quitting: 61.6  . Smokeless tobacco: Never Used  Vaping Use  . Vaping Use: Never used  Substance and Sexual Activity  . Alcohol use: No    Alcohol/week: 0.0 standard drinks  . Drug use: No  . Sexual activity: Not Currently    Birth control/protection: Post-menopausal    Comment: menarche age 85, P66, first birth age 44, no HRT, menopause age 31  Other Topics Concern  . Not on file  Social History Narrative  . Not on file   Social Determinants of Health   Financial Resource Strain: Low Risk   . Difficulty of Paying Living Expenses: Not very hard  Food Insecurity: Not on file  Transportation Needs: Not on file  Physical Activity: Not on file  Stress: Not on file  Social Connections: Not on file  Intimate Partner Violence: Not on file      Review of Systems  All other systems reviewed and are negative.      Objective:   Physical Exam Vitals reviewed.  Constitutional:      General: She is not in acute distress.    Appearance: Normal appearance. She is not ill-appearing or toxic-appearing.   HENT:     Right Ear: Tympanic membrane and ear canal normal.     Left Ear: Tympanic membrane and ear canal normal.     Nose: No congestion or rhinorrhea.     Mouth/Throat:     Pharynx: No oropharyngeal exudate or posterior oropharyngeal erythema.  Cardiovascular:     Rate and Rhythm: Normal rate. Rhythm irregular.     Heart sounds: Normal heart sounds.  Pulmonary:     Effort: Pulmonary effort is normal. No respiratory distress.     Breath sounds: Examination of the right-lower field reveals decreased breath sounds. Examination of the left-lower field reveals rales. Decreased breath sounds and rales present. No wheezing or rhonchi.    Chest:     Chest wall: No tenderness.  Abdominal:     General: Abdomen is flat. Bowel sounds are normal. There is no distension.     Palpations: Abdomen is soft.     Tenderness: There is no abdominal tenderness. There is no guarding or rebound.  Musculoskeletal:     Right lower leg: Edema present.     Left lower leg: Edema present.  Lymphadenopathy:     Cervical: No cervical adenopathy.  Neurological:     Mental Status: She is alert.           Assessment & Plan:  Rectal bleeding - Plan: Hemoglobin, fingerstick Patient has an active lower GI bleed.  Her hemoglobin has dropped precipitously from 10.3 down to around 7.  Patient's heart rate and blood pressure are stable. She is not in shock.  She certainly needs to discontinue her anticoagulant.  However given the level of her anemia, I am not certain that the bleeding will stop simply by discontinuing Eliquis before her hemoglobin becomes dangerously low.  Therefore I recommended that she go to the emergency room.  I believe that she would benefit from surveillance in the hospital with frequent hemoglobin checks after discontinuing her anticoagulant to determine if she is losing sufficient blood to require transfusion.  She may also require repeat colonoscopy if the bleeding does not stop on its own.   I believe that she is too frail given her age to do this as an outpatient in case the bleeding does not subside.  Therefore I recommended that she go to the emergency room.

## 2020-07-04 NOTE — ED Triage Notes (Signed)
Emergency Medicine Provider Triage Evaluation Note  Whitney [name] , a 87 y.o. female  was evaluated in triage.  Pt complains of PCP office or hemoglobin of 5.6.  Denies shortness of breath, chest pain, abdominal pain.  Is anticoagulated on Eliquis.  Takes a iron supplement, denies changes in stools.  Review of Systems  Positive: Dark stools Negative: Chest pain, shortness of breath, lightheadedness, lightheadedness, abdominal pain  Physical Exam  BP 100/87 (BP Location: Right Arm)   Pulse 85   Temp 98.2 F (36.8 C)   Resp 14   LMP  (LMP Unknown)   SpO2 96%  Gen:   Awake, no distress   HEENT:  Atraumatic  Resp:  Normal effort  Cardiac:  Normal rate  Abd:   Nondistended, nontender  MSK:   Moves extremities without difficulty  Neuro:  Speech clear  Skin: pale (daughter states does not appear different from normal)  Medical Decision Making  Medically screening exam initiated at 12:06 PM.  Appropriate orders placed.  Whitney [name] was informed that the remainder of the evaluation will be completed by another provider, this initial triage assessment does not replace that evaluation, and the importance of remaining in the ED until their evaluation is complete.  Clinical Impression     Roque Lias 07/04/20 1207

## 2020-07-04 NOTE — ED Triage Notes (Signed)
Pt presents to the ED from her PCP with low hemoglobin levels between 5-6. Pt is pale but daughter states the patient looks normal. Pt has no complaints. Pt has a hx of anemia.

## 2020-07-04 NOTE — Progress Notes (Signed)
Received Pt on a stretcher accompanied by her granddaughter. Pt assisted to bed, vital signs taken and recorded, placed on cardiac monitor 5N02, placed SCDs to bilateral LE. Pt denies any pain 0/10, not in distress, educated on using IS. Endorsed accordingly to [employer], Therapist, sports.

## 2020-07-04 NOTE — Consult Note (Addendum)
Referring Provider:  [facility]         Primary Care Physician:  [name], MD Primary Gastroenterologist:   Zenovia Jarred, MD           We were asked to see this patient for:     Rectal bleeding       Attending physician's note   I have taken a history, examined the patient and reviewed the chart. I agree with the Advanced Practitioner's note, impression and recommendations.  Bright red blood per rectum likely secondary to small volume hemorrhage from internal hemorrhoids Hgb is stable at baseline  Hold Eliquis, will need to discuss benefit and risk associated with long term anticoagulation given her age  Anusol suppository per rectum  Will hold off colonoscopy or flex sig unless has evidence of ongoing GI bleed  Diet as tolerated  GI will continue to follow along   The patient was provided an opportunity to ask questions and all were answered. The patient agreed with the plan and demonstrated an understanding of the instructions.  Whitney [name] , MD [phone]           ASSESSMENT / PLAN:   # 87 yo female with rectal bleeding on Eliquis. Suspect hemorrhoidal bleeding. She has a history of chronic colitis ( untreated) but not having loose stool so doubt etiology of bleeding. Diverticular hemorrhage possible but volume of blood sounds quite small. Her hgb is overall stable at 9.9 ( about her baseline).  --No bleeding since yesterday. Soft, dark green stool in vault on exam.  --Monitor overnight. She may need colonoscopy if continues to bleed but hopefully this can be avoided given her age, co-morbidities. --Anusol suppositories.     # Hx of adenomatous colon polyps. A 40 mm tranverse colon poly was resected by [place] in December. Pathology showed high grade dysplasia with possible focus of adenocarcinoma. Patient nor daugter wanted to pursue surveillance colonoscopy despite out concern for cancer.   # Chronic active colitis on colon biopsies in December.  Untreated. Mesalamne discussed at last office visit in March but not yet started. She has a history of loose stools but lately her BMs have been solid / soft.   HPI:                                                                                                                             Chief Complaint:  Rectal bleeding   Whitney [name] is a 87 y.o. female with a history of Afib, breast cancer, HTN, hyperlipidemia, hypothyroidism, severe diverticulosis, chronic colitis, colonic stenosis, and colon adenomas. In August 2021 patient had a colonoscopy for rectal bleeding. She was found to have severe diverticulosis and a 40 mm tranverse colon polyp as well as a couple of small polyps. The larger polyp path showed high grade dysplasia with possible focus of adenocarcinoma. She was referred to [name] for EMR which she had late December.. Patient nor  her daughter wanted to move forward with further workup, follow up colonoscopy. Chronic colitis was also found on biopsies. Starting Mesalamine was discussed but not started.   Patient in ED with complaints of painless rectal bleedingl since Tuesday. No abdominal or rectal pain. Stools are soft.  She takes Eliquis. Last episode of bleeding was yesterday.    PREVIOUS ENDOSCOPIC EVALUATIONS / PERTINENT STUDIES              Past Medical History:  Diagnosis Date  . Anemia   . Atrial fibrillation ([place])   . Breast cancer ([place]) 10/2013   right upper outer  . Cancer ([place])    right breast  . Complication of anesthesia    slow to wake up  . Diabetes mellitus without complication ([place])    type 2  . Dysrhythmia 10/15   AF  . Former smoker   . Full dentures   . Hyperlipidemia   . Hypertension   . Multinodular goiter   . Osteoporosis   . Radiation    Right Breast  . Thyroid disease    hypothyroidism  . Wears glasses     Past Surgical History:  Procedure Laterality Date  . ABDOMINAL HYSTERECTOMY    . AXILLARY LYMPH NODE  DISSECTION Right 01/09/2014   Procedure: RIGHT AXILLARY LYMPH NODE DISECTION;  Surgeon: [name], MD;  Location: [place];  Service: General;  Laterality: Right;  . BIOPSY  10/26/2019   Procedure: BIOPSY;  Surgeon: Rush Landmark Telford Nab., MD;  Location: [facility];  Service: Gastroenterology;;  . BIOPSY  03/11/2020   Procedure: BIOPSY;  Surgeon: Irving Copas., MD;  Location: [place];  Service: Gastroenterology;;  . BREAST LUMPECTOMY WITH RADIOACTIVE SEED LOCALIZATION Right 01/09/2014   Procedure: RIGHT BREAST SEED LOCALIZED LUMPECTOMY ;  Surgeon: [name], MD;  Location: [place];  Service: General;  Laterality: Right;  . COLONOSCOPY WITH PROPOFOL N/A 10/26/2019   Procedure: COLONOSCOPY WITH PROPOFOL;  Surgeon: Rush Landmark Telford Nab., MD;  Location: [facility];  Service: Gastroenterology;  Laterality: N/A;  . COLONOSCOPY WITH PROPOFOL N/A 03/11/2020   Procedure: COLONOSCOPY WITH PROPOFOL- Ultraslim scope;  Surgeon: Irving Copas., MD;  Location: [place];  Service: Gastroenterology;  Laterality: N/A;  . ENDOSCOPIC MUCOSAL RESECTION N/A 03/11/2020   Procedure: ENDOSCOPIC MUCOSAL RESECTION;  Surgeon: Rush Landmark Telford Nab., MD;  Location: [place];  Service: Gastroenterology;  Laterality: N/A;  . ESOPHAGOGASTRODUODENOSCOPY (EGD) WITH PROPOFOL N/A 10/26/2019   Procedure: ESOPHAGOGASTRODUODENOSCOPY (EGD) WITH PROPOFOL;  Surgeon: Rush Landmark Telford Nab., MD;  Location: [facility];  Service: Gastroenterology;  Laterality: N/A;  . EYE SURGERY  12/22/2009   cataracts  . HEMOSTASIS CLIP PLACEMENT  10/26/2019   Procedure: HEMOSTASIS CLIP PLACEMENT;  Surgeon: Irving Copas., MD;  Location: [facility];  Service: Gastroenterology;;  marking for transverse lesion  . INTRAMEDULLARY (IM) NAIL INTERTROCHANTERIC Left 02/24/2014   Procedure: IM ROD LEFT HIP FX;  Surgeon: Alta Corning, MD;  Location: [place];  Service:  Orthopedics;  Laterality: Left;  . left distal radius fracture     2020  . POLYPECTOMY  10/26/2019   Procedure: POLYPECTOMY;  Surgeon: Mansouraty, Telford Nab., MD;  Location: [facility];  Service: Gastroenterology;;  . POLYPECTOMY  03/11/2020   Procedure: POLYPECTOMY;  Surgeon: Irving Copas., MD;  Location: [place];  Service: Gastroenterology;;  . PORT-A-CATH REMOVAL Right 01/09/2014   Procedure: REMOVAL PORT-A-CATH;  Surgeon: [name], MD;  Location: [place];  Service: General;  Laterality: Right;  .  PORTACATH PLACEMENT N/A 11/15/2013   Procedure: INSERTION PORT-A-CATH WITH ULTRA SOUND AND FLOROSCOPY;  Surgeon: [name], MD;  Location: [place];  Service: General;  Laterality: N/A;  . SUBMUCOSAL TATTOO INJECTION  10/26/2019   Procedure: SUBMUCOSAL TATTOO INJECTION;  Surgeon: Irving Copas., MD;  Location: [facility];  Service: Gastroenterology;;  . TOTAL KNEE ARTHROPLASTY  2002   left    Prior to Admission medications   Medication Sig Start Date End Date Taking? Authorizing Provider  amiodarone (PACERONE) 200 MG tablet Take 1 tablet (200 mg total) by mouth daily. 02/05/20  Yes [name], MD  apixaban (ELIQUIS) 2.5 MG TABS tablet Take 1 tablet (2.5 mg total) by mouth 2 (two) times daily. Provider has put Patient on 2.5 mg twice daily. 03/14/20  Yes Mansouraty, Telford Nab., MD  atorvastatin (LIPITOR) 10 MG tablet Take 1 tablet (10 mg total) by mouth daily. 05/03/20  Yes [name], MD  Blood Glucose Monitoring Suppl (ONETOUCH VERIO) w/Device KIT Use to test blood sugars daily. 11/23/17  Yes Elayne Snare, MD  cholecalciferol (VITAMIN D3) 25 MCG (1000 UNIT) tablet TAKE 1 TAB BY MOUTH EVERY DAY Patient taking differently: Take 1,000 Units by mouth daily. 06/03/20  Yes [name], MD  docusate sodium (COLACE) 100 MG capsule Take 100 mg by mouth daily.   Yes [provider]  doxazosin (CARDURA) 4 MG tablet Take 0.5  tablets (2 mg total) by mouth daily. 10/30/19  Yes Rai, Ripudeep K, MD  ferrous sulfate 324 (65 Fe) MG TBEC TAKE 1 TABLET BY MOUTH IN THE MORNING AND 1 AT BEDTIME Patient taking differently: Take 324 mg by mouth 2 (two) times daily. 05/07/20  Yes [name], MD  furosemide (LASIX) 40 MG tablet Take 2 tablets (80 mg total) by mouth daily. Patient taking differently: Take 80 mg by mouth 2 (two) times daily. 02/06/20  Yes Pickard, Cammie Mcgee, MD  JANUVIA 100 MG tablet TAKE 0.5 TAB BY MOUTH DAILY Patient taking differently: Take 50 mg by mouth daily. 06/03/20  Yes Pickard, Cammie Mcgee, MD  KLOR-CON M20 20 MEQ tablet TAKE 1 TABLET BY MOUTH EVERY DAY Patient taking differently: Take 20 mEq by mouth daily. 06/03/20  Yes Elayne Snare, MD  meclizine (ANTIVERT) 12.5 MG tablet TAKE 1 TABLET BY MOUTH DAILY AS NEEDED FOR DIZZINESS Patient taking differently: Take 12.5 mg by mouth as needed for dizziness. 06/03/20  Yes [name], MD  metoprolol succinate (TOPROL-XL) 100 MG 24 hr tablet TAKE 1 TABLET BY MOUTH TWICE A DAY WITH OR IMMEDIATELY FOLLOWING A MEAL Patient taking differently: Take 100 mg by mouth 2 (two) times daily. WITH OR IMMEDIATELY FOLLOWING A MEAL 02/05/20  Yes Elayne Snare, MD  [facility] VERIO test strip CHECK BLOOD SUGAR EVERY DAY AS DIRECTED 02/05/20  Yes Elayne Snare, MD  polyethylene glycol (MIRALAX / GLYCOLAX) 17 g packet Take 17 g by mouth daily. Also available OTC Patient taking differently: Take 17 g by mouth as needed for mild constipation. 10/31/19  Yes Rai, Ripudeep K, MD  tamoxifen (NOLVADEX) 20 MG tablet TAKE 1 TABLET BY MOUTH EVERY DAY Patient taking differently: Take 20 mg by mouth daily. 03/25/20  Yes Nicholas Lose, MD    Current Facility-Administered Medications  Medication Dose Route Frequency Provider Last Rate Last Admin  . acetaminophen (TYLENOL) tablet 650 mg  650 mg Oral Q6H PRN Fuller Plan A, MD       Or  . acetaminophen (TYLENOL) suppository 650 mg  650  mg Rectal  Q6H PRN Fuller Plan A, MD      . denosumab (PROLIA) injection 60 mg  60 mg Subcutaneous Q6 months [name], MD   60 mg at 04/19/20 1135  . sodium chloride flush (NS) 0.9 % injection 3 mL  3 mL Intravenous Q12H Norval Morton, MD       Current Outpatient Medications  Medication Sig Dispense Refill  . amiodarone (PACERONE) 200 MG tablet Take 1 tablet (200 mg total) by mouth daily. 90 tablet 3  . apixaban (ELIQUIS) 2.5 MG TABS tablet Take 1 tablet (2.5 mg total) by mouth 2 (two) times daily. Provider has put Patient on 2.5 mg twice daily. 90 tablet 3  . atorvastatin (LIPITOR) 10 MG tablet Take 1 tablet (10 mg total) by mouth daily. 90 tablet 1  . Blood Glucose Monitoring Suppl (ONETOUCH VERIO) w/Device KIT Use to test blood sugars daily. 1 kit 0  . cholecalciferol (VITAMIN D3) 25 MCG (1000 UNIT) tablet TAKE 1 TAB BY MOUTH EVERY DAY (Patient taking differently: Take 1,000 Units by mouth daily.) 100 tablet 1  . docusate sodium (COLACE) 100 MG capsule Take 100 mg by mouth daily.    Marland Kitchen doxazosin (CARDURA) 4 MG tablet Take 0.5 tablets (2 mg total) by mouth daily.    . ferrous sulfate 324 (65 Fe) MG TBEC TAKE 1 TABLET BY MOUTH IN THE MORNING AND 1 AT BEDTIME (Patient taking differently: Take 324 mg by mouth 2 (two) times daily.) 180 tablet 1  . furosemide (LASIX) 40 MG tablet Take 2 tablets (80 mg total) by mouth daily. (Patient taking differently: Take 80 mg by mouth 2 (two) times daily.) 90 tablet 2  . JANUVIA 100 MG tablet TAKE 0.5 TAB BY MOUTH DAILY (Patient taking differently: Take 50 mg by mouth daily.) 45 tablet 3  . KLOR-CON M20 20 MEQ tablet TAKE 1 TABLET BY MOUTH EVERY DAY (Patient taking differently: Take 20 mEq by mouth daily.) 90 tablet 1  . meclizine (ANTIVERT) 12.5 MG tablet TAKE 1 TABLET BY MOUTH DAILY AS NEEDED FOR DIZZINESS (Patient taking differently: Take 12.5 mg by mouth as needed for dizziness.) 30 tablet 2  . metoprolol succinate (TOPROL-XL) 100 MG 24 hr tablet TAKE 1  TABLET BY MOUTH TWICE A DAY WITH OR IMMEDIATELY FOLLOWING A MEAL (Patient taking differently: Take 100 mg by mouth 2 (two) times daily. WITH OR IMMEDIATELY FOLLOWING A MEAL) 180 tablet 1  . ONETOUCH VERIO test strip CHECK BLOOD SUGAR EVERY DAY AS DIRECTED 100 strip 2  . polyethylene glycol (MIRALAX / GLYCOLAX) 17 g packet Take 17 g by mouth daily. Also available OTC (Patient taking differently: Take 17 g by mouth as needed for mild constipation.) 30 each 0  . tamoxifen (NOLVADEX) 20 MG tablet TAKE 1 TABLET BY MOUTH EVERY DAY (Patient taking differently: Take 20 mg by mouth daily.) 90 tablet 3    Allergies as of 07/04/2020 - Review Complete 07/04/2020  Allergen Reaction Noted  . Tape Other (See Comments) 12/31/2015    Family History  Problem Relation Age of Onset  . Colon cancer Neg Hx   . Esophageal cancer Neg Hx   . Inflammatory bowel disease Neg Hx   . Liver disease Neg Hx   . Pancreatic cancer Neg Hx   . Rectal cancer Neg Hx   . Stomach cancer Neg Hx     Social History   Socioeconomic History  . Marital status: Widowed    Spouse name: Not on file  .  Number of children: 4  . Years of education: Not on file  . Highest education level: Not on file  Occupational History  . Not on file  Tobacco Use  . Smoking status: Former Smoker    Packs/day: 1.00    Years: 5.00    Pack years: 5.00    Quit date: 11/14/1958    Years since quitting: 61.6  . Smokeless tobacco: Never Used  Vaping Use  . Vaping Use: Never used  Substance and Sexual Activity  . Alcohol use: No    Alcohol/week: 0.0 standard drinks  . Drug use: No  . Sexual activity: Not Currently    Birth control/protection: Post-menopausal    Comment: menarche age 89, P18, first birth age 89, no HRT, menopause age 63  Other Topics Concern  . Not on file  Social History Narrative  . Not on file   Social Determinants of Health   Financial Resource Strain: Low Risk   . Difficulty of Paying Living Expenses: Not very hard   Food Insecurity: Not on file  Transportation Needs: Not on file  Physical Activity: Not on file  Stress: Not on file  Social Connections: Not on file  Intimate Partner Violence: Not on file    Review of Systems: All systems reviewed and negative except where noted in HPI.  OBJECTIVE:    Physical Exam: Vital signs in last 24 hours: Temp:  [98.2 F (36.8 C)-98.7 F (37.1 C)] 98.2 F (36.8 C) (04/21 1200) Pulse Rate:  [78-98] 96 (04/21 1500) Resp:  [14-22] 20 (04/21 1500) BP: (100-153)/(70-98) 153/98 (04/21 1500) SpO2:  [96 %-100 %] 98 % (04/21 1500)   General:   Alert  female in NAD Psych:  Pleasant, cooperative. Normal mood and affect. Eyes:  Pupils equal, sclera clear, no icterus.   Conjunctiva pink. Ears:  Normal auditory acuity. Nose:  No deformity, discharge,  or lesions. Neck:  Supple; no masses Lungs:  Clear throughout to auscultation.   No wheezes, crackles, or rhonchi.  Heart:  Regular rate , no lower extremity edema Abdomen:  Soft, non-distended, nontender, BS active, no palp mass   Rectal:  No external lesion seen. Dark green, soft stool in vault. No blood in vault Msk:  Symmetrical without gross deformities. . Neurologic:  Alert and  oriented x4;  grossly normal neurologically. Skin:  Intact without significant lesions or rashes.  Scheduled inpatient medications . denosumab  60 mg Subcutaneous Q6 months  . sodium chloride flush  3 mL Intravenous Q12H      Intake/Output from previous day: No intake/output data recorded. Intake/Output this shift: No intake/output data recorded.   Lab Results: Recent Labs    07/04/20 1420  WBC 3.5*  HGB 9.9*  HCT 30.5*  PLT 165   BMET Recent Labs    07/04/20 1203  NA 139  K 3.8  CL 105  CO2 25  GLUCOSE 170*  BUN 29*  CREATININE 1.56*  CALCIUM 8.5*   LFT Recent Labs    07/04/20 1203  PROT 5.2*  ALBUMIN 2.8*  AST 43*  ALT 32  ALKPHOS 34*  BILITOT 0.8  BILIDIR 0.3*  IBILI 0.5    PT/INR Recent Labs    07/04/20 1238  LABPROT 20.0*  INR 1.7*   Hepatitis Panel No results for input(s): HEPBSAG, HCVAB, HEPAIGM, HEPBIGM in the last 72 hours.   . CBC Latest Ref Rng & Units 07/04/2020 01/29/2020 11/28/2019  WBC 4.0 - 10.5 K/uL 3.5(L) 3.9 4.6  Hemoglobin 12.0 - 15.0  g/dL 9.9(L) 10.3(L) 9.8(L)  Hematocrit 36.0 - 46.0 % 30.5(L) 30.9(L) 29.2(L)  Platelets 150 - 400 K/uL 165 159 165.0    . CMP Latest Ref Rng & Units 07/04/2020 06/17/2020 06/10/2020  Glucose 70 - 99 mg/dL 170(H) 171(H) 128(H)  BUN 8 - 23 mg/dL 29(H) 30(H) 21  Creatinine 0.44 - 1.00 mg/dL 1.56(H) 1.50(H) 1.26(H)  Sodium 135 - 145 mmol/L 139 142 143  Potassium 3.5 - 5.1 mmol/L 3.8 4.1 4.0  Chloride 98 - 111 mmol/L 105 103 105  CO2 22 - 32 mmol/L _0 Calcium 8.9 - 10.3 mg/dL 8.5(L) 8.3(L) 8.1(L)  Total Protein 6.5 - 8.1 g/dL 5.2(L) - 5.4(L)  Total Bilirubin 0.3 - 1.2 mg/dL 0.8 - 0.7  Alkaline Phos 38 - 126 U/L 34(L) - -  AST 15 - 41 U/L 43(H) - 43(H)  ALT 0 - 44 U/L 32 - 35(H)   Studies/Results: DG Chest Port 1 View  Result Date: 07/04/2020 CLINICAL DATA:  Anemia. EXAM: PORTABLE CHEST 1 VIEW COMPARISON:  06/10/2020 FINDINGS: 1322 hours. The cardio pericardial silhouette is enlarged. Right base collapse/consolidation with small right pleural effusion, similar to prior. Airspace disease with left fusion seen at the left base previously has almost resolved in the interval. There is pulmonary vascular congestion without overt pulmonary edema. Bones are diffusely demineralized. Telemetry leads overlie the chest. IMPRESSION: 1. Interval improvement in left base aeration. 2. Persistent right base collapse/consolidation with small right pleural effusion. Electronically Signed   By: Misty Stanley M.D.   On: 07/04/2020 13:49    Active Problems:   GI bleed    Tye Savoy, NP-C @  07/04/2020, 4:29 PM

## 2020-07-05 DIAGNOSIS — E1122 Type 2 diabetes mellitus with diabetic chronic kidney disease: Secondary | ICD-10-CM | POA: Diagnosis not present

## 2020-07-05 DIAGNOSIS — K648 Other hemorrhoids: Secondary | ICD-10-CM | POA: Diagnosis not present

## 2020-07-05 DIAGNOSIS — D539 Nutritional anemia, unspecified: Secondary | ICD-10-CM | POA: Diagnosis present

## 2020-07-05 DIAGNOSIS — D631 Anemia in chronic kidney disease: Secondary | ICD-10-CM | POA: Diagnosis present

## 2020-07-05 DIAGNOSIS — N1832 Chronic kidney disease, stage 3b: Secondary | ICD-10-CM | POA: Diagnosis not present

## 2020-07-05 DIAGNOSIS — Z86010 Personal history of colonic polyps: Secondary | ICD-10-CM | POA: Diagnosis not present

## 2020-07-05 DIAGNOSIS — E039 Hypothyroidism, unspecified: Secondary | ICD-10-CM | POA: Diagnosis not present

## 2020-07-05 DIAGNOSIS — D72819 Decreased white blood cell count, unspecified: Secondary | ICD-10-CM | POA: Diagnosis present

## 2020-07-05 DIAGNOSIS — K625 Hemorrhage of anus and rectum: Secondary | ICD-10-CM | POA: Diagnosis not present

## 2020-07-05 DIAGNOSIS — I130 Hypertensive heart and chronic kidney disease with heart failure and stage 1 through stage 4 chronic kidney disease, or unspecified chronic kidney disease: Secondary | ICD-10-CM | POA: Diagnosis not present

## 2020-07-05 DIAGNOSIS — E785 Hyperlipidemia, unspecified: Secondary | ICD-10-CM | POA: Diagnosis not present

## 2020-07-05 DIAGNOSIS — Z79899 Other long term (current) drug therapy: Secondary | ICD-10-CM | POA: Diagnosis not present

## 2020-07-05 DIAGNOSIS — Z853 Personal history of malignant neoplasm of breast: Secondary | ICD-10-CM | POA: Diagnosis not present

## 2020-07-05 DIAGNOSIS — Z91048 Other nonmedicinal substance allergy status: Secondary | ICD-10-CM | POA: Diagnosis not present

## 2020-07-05 DIAGNOSIS — Z7901 Long term (current) use of anticoagulants: Secondary | ICD-10-CM

## 2020-07-05 DIAGNOSIS — I480 Paroxysmal atrial fibrillation: Secondary | ICD-10-CM | POA: Diagnosis not present

## 2020-07-05 DIAGNOSIS — I5033 Acute on chronic diastolic (congestive) heart failure: Secondary | ICD-10-CM | POA: Diagnosis not present

## 2020-07-05 DIAGNOSIS — K5190 Ulcerative colitis, unspecified, without complications: Secondary | ICD-10-CM | POA: Diagnosis not present

## 2020-07-05 DIAGNOSIS — Z20822 Contact with and (suspected) exposure to covid-19: Secondary | ICD-10-CM | POA: Diagnosis not present

## 2020-07-05 DIAGNOSIS — Z87891 Personal history of nicotine dependence: Secondary | ICD-10-CM | POA: Diagnosis not present

## 2020-07-05 DIAGNOSIS — Z7984 Long term (current) use of oral hypoglycemic drugs: Secondary | ICD-10-CM | POA: Diagnosis not present

## 2020-07-05 LAB — BASIC METABOLIC PANEL
Anion gap: 9 (ref 5–15)
BUN: 30 mg/dL — ABNORMAL HIGH (ref 8–23)
CO2: 26 mmol/L (ref 22–32)
Calcium: 8.2 mg/dL — ABNORMAL LOW (ref 8.9–10.3)
Chloride: 105 mmol/L (ref 98–111)
Creatinine, Ser: 1.47 mg/dL — ABNORMAL HIGH (ref 0.44–1.00)
GFR, Estimated: 34 mL/min — ABNORMAL LOW (ref >60)
Glucose, Bld: 137 mg/dL — ABNORMAL HIGH (ref 70–99)
Potassium: 3.8 mmol/L (ref 3.5–5.1)
Sodium: 140 mmol/L (ref 135–145)

## 2020-07-05 LAB — CBC
HCT: 26.9 % — ABNORMAL LOW (ref 36.0–46.0)
Hemoglobin: 8.6 g/dL — ABNORMAL LOW (ref 12.0–15.0)
MCH: 35.2 pg — ABNORMAL HIGH (ref 26.0–34.0)
MCHC: 32 g/dL (ref 30.0–36.0)
MCV: 110.2 fL — ABNORMAL HIGH (ref 80.0–100.0)
Platelets: 135 10*3/uL — ABNORMAL LOW (ref 150–400)
RBC: 2.44 MIL/uL — ABNORMAL LOW (ref 3.87–5.11)
RDW: 15.1 % (ref 11.5–15.5)
WBC: 3.1 10*3/uL — ABNORMAL LOW (ref 4.0–10.5)
nRBC: 0 % (ref 0.0–0.2)

## 2020-07-05 LAB — GLUCOSE, CAPILLARY
Glucose-Capillary: 146 mg/dL — ABNORMAL HIGH (ref 70–99)
Glucose-Capillary: 148 mg/dL — ABNORMAL HIGH (ref 70–99)
Glucose-Capillary: 152 mg/dL — ABNORMAL HIGH (ref 70–99)
Glucose-Capillary: 125 mg/dL — ABNORMAL HIGH (ref 70–99)

## 2020-07-05 MED ORDER — FUROSEMIDE 10 MG/ML IJ SOLN
60.0000 mg | Freq: Two times a day (BID) | INTRAMUSCULAR | Status: DC
  Administered 2020-07-05: 60 mg via INTRAVENOUS
  Filled 2020-07-05: qty 6

## 2020-07-05 NOTE — Progress Notes (Signed)
PROGRESS NOTE    LEASTER RUGGERIO  [id number] DOB: Mar 27, 1927 DOA: 07/04/2020 PCP: [name], MD   Chief Complain: Rectal bleeding  Brief Narrative: Patient is a 87 year old female with history of hypertension, hyperlipidemia, paroxysmal A. fib on Eliquis, history of breast cancer status postlumpectomy, diabetes type 2, hypothyroidism, tubular adenoma status post removal who presented with complaints of rectal bleeding for 2 days before admission.  She lives with her granddaughter.  She was seen at her PCPs office where her hemoglobin was found to be 7.4 and was sent for further evaluation.Her last colonoscopy had shown internal hemorrhoids.  On presentation, her hemoglobin was stable in the range of 9.9.  She was found to have severe bilateral lower extremity edema.  FOBT was positive.  GI was consulted with no plan for intervention right now and the plan is for monitoring.  Rectal bleeding suspected secondary to internal hemorrhoids.  Plan for discharge tomorrow to home if hemoglobin remains stable in a.m.  Assessment & Plan:   Principal Problem:   Rectal bleeding Active Problems:   Essential hypertension, benign   Anticoagulated   Controlled diabetes mellitus type 2 with complications (HCC)   Paroxysmal atrial fibrillation (HCC)   Stage 3b chronic kidney disease (HCC)   Macrocytic anemia   Prolonged QT interval   History of breast cancer   Rectal bleed   Hematochezia: History of internal hemorrhoids.  Currently on Anusol.  Hemoglobin stable in the range of 8-9.  There was plan for discharge today as there was no acute bleeding since admission but RN noticed some blood in the rectum while inserting the Anusol. We are planning for monitoring overnight, checking hemoglobin tomorrow morning.  GI following. She follows with [name], she has history of tubular adenoma status post resection in December 2021.  Microcytic anemia: Continue monitoring.  Vitamin 123456 and  folic acid normal.  Paroxysmal A. fib: Currently heart rate is well controlled.  Takes Eliquis at home.  Also on metoprolol and amiodarone.  Will most likely resume Eliquis on discharge  Acute on chronic diastolic congestive heart failure: Last echo showed ejection fraction of 123456, grade 2 diastolic dysfunction.  She has 2-3+ bilateral lower extremity pitting edema which is chronic.  She was noted to be fluid overloaded on presentation.  BNP elevated.  Chest x-ray showed a small right-sided pleural effusion but her lungs are clear.  Continue IV Lasix for now. She takes Lasix 80 mg twice daily at home.  Leukopenia: Acute on chronic.  Currently stable.  Continue to monitor  CKD stage IIIb: Baseline creatinine around 1.2.  Continue monitoring  Prolonged QTC: QTC prolonged at 706 on presentation, improved to 521.  Check follow-up EKG tomorrow  History of hypertension: Currently stable.  Continue current medications  Diabetes type 2: Controlled.  Recent hemoglobin A1c of 5.8.  Continue current insulin regimen.  Takes Januvia at home  Hypothyroidism: On levothyroxine  Hyperlipidemia: Elevated  History of breast cancer: Status post right lumpectomy.  On tamoxifen at home.          DVT prophylaxis:SCD Code Status: Full Family Communication: Discussed with granddaughter on phone Status is: Inpatient  Remains inpatient appropriate because:Inpatient level of care appropriate due to severity of illness   Dispo: The patient is from: Home              Anticipated d/c is to: Home              Patient currently is not medically stable  to d/c.   Difficult to place patient No     Consultants: GI  Procedures:None  Antimicrobials:  Anti-infectives (From admission, onward)   None      Subjective: Patient seen and examined at the bedside this morning.  Comfortable during my evaluation.  Hemodynamically stable.  Sitting on the edge of the bed.  Denies any black stools or frank  rectal bleeding after admission.  Has significant lower extremity edema.  Eager to go home.  Objective: Vitals:   07/04/20 1902 07/04/20 2028 07/05/20 0356 07/05/20 0823  BP: 140/84 135/74 118/61 (!) 135/91  Pulse: 82 75 93 96  Resp: 17 16 15 18   Temp: 97.6 F (36.4 C) (!) 97.5 F (36.4 C) 98 F (36.7 C) 98.1 F (36.7 C)  TempSrc: Oral Oral Oral Oral  SpO2:  100% 96% 99%    Intake/Output Summary (Last 24 hours) at 07/05/2020 1341 Last data filed at 07/05/2020 1100 Gross per 24 hour  Intake 480 ml  Output 2 ml  Net 478 ml   There were no vitals filed for this visit.  Examination:  General exam: Pleasant elderly female HEENT:PERRL,Oral mucosa moist, Ear/Nose normal on gross exam Respiratory system: Bilateral equal air entry, normal vesicular breath sounds, no wheezes or crackles  Cardiovascular system: S1 & S2 heard, RRR. No JVD, murmurs, rubs, gallops or clicks.  Gastrointestinal system: Abdomen is nondistended, soft and nontender. No organomegaly or masses felt. Normal bowel sounds heard. Central nervous system: Alert and oriented. No focal neurological deficits. Extremities: 2-3+ bilateral lower extremity pitting  edema, no clubbing ,no cyanosis Skin: No rashes, lesions or ulcers,no icterus ,no pallor   Data Reviewed: I have personally reviewed following labs and imaging studies  CBC: Recent Labs  Lab 07/04/20 1420 07/04/20 1700 07/05/20 0051  WBC 3.5*  --  3.1*  HGB 9.9* 9.2* 8.6*  HCT 30.5* 28.4* 26.9*  MCV 110.1*  --  110.2*  PLT 165  --  [id number]*   Basic Metabolic Panel: Recent Labs  Lab 07/04/20 1203 07/04/20 1700 07/05/20 0051  NA 139  --  140  K 3.8  --  3.8  CL 105  --  105  CO2 25  --  26  GLUCOSE 170*  --  137*  BUN 29*  --  30*  CREATININE 1.56*  --  1.47*  CALCIUM 8.5*  --  8.2*  MG  --  1.8  --    GFR: CrCl cannot be calculated (Unknown ideal weight.). Liver Function Tests: Recent Labs  Lab 07/04/20 1203  AST 43*  ALT 32  ALKPHOS 34*   BILITOT 0.8  PROT 5.2*  ALBUMIN 2.8*   No results for input(s): LIPASE, AMYLASE in the last 168 hours. No results for input(s): AMMONIA in the last 168 hours. Coagulation Profile: Recent Labs  Lab 07/04/20 1238  INR 1.7*   Cardiac Enzymes: No results for input(s): CKTOTAL, CKMB, CKMBINDEX, TROPONINI in the last 168 hours. BNP (last 3 results) No results for input(s): PROBNP in the last 8760 hours. HbA1C: No results for input(s): HGBA1C in the last 72 hours. CBG: Recent Labs  Lab 07/05/20 0643 07/05/20 1217  GLUCAP 146* 148*   Lipid Profile: No results for input(s): CHOL, HDL, LDLCALC, TRIG, CHOLHDL, LDLDIRECT in the last 72 hours. Thyroid Function Tests: Recent Labs    07/04/20 1238  TSH 1.917   Anemia Panel: Recent Labs    07/04/20 1238  VITAMINB12 1,538*  FOLATE 12.5   Sepsis Labs: No results for  input(s): PROCALCITON, LATICACIDVEN in the last 168 hours.  Recent Results (from the past 240 hour(s))  Resp Panel by RT-PCR (Flu A&B, Covid) Nasopharyngeal Swab     Status: None   Collection Time: 07/04/20 12:39 PM   Specimen: Nasopharyngeal Swab; Nasopharyngeal(NP) swabs in vial transport medium  Result Value Ref Range Status   SARS Coronavirus 2 by RT PCR NEGATIVE NEGATIVE Final    Comment: (NOTE) SARS-CoV-2 target nucleic acids are NOT DETECTED.  The SARS-CoV-2 RNA is generally detectable in upper respiratory specimens during the acute phase of infection. The lowest concentration of SARS-CoV-2 viral copies this assay can detect is 138 copies/mL. A negative result does not preclude SARS-Cov-2 infection and should not be used as the sole basis for treatment or other patient management decisions. A negative result may occur with  improper specimen collection/handling, submission of specimen other than nasopharyngeal swab, presence of viral mutation(s) within the areas targeted by this assay, and inadequate number of viral copies(<138 copies/mL). A negative  result must be combined with clinical observations, patient history, and epidemiological information. The expected result is Negative.  Fact Sheet for Patients:  EntrepreneurPulse.com.au  Fact Sheet for Healthcare Providers:  IncredibleEmployment.be  This test is no t yet approved or cleared by the [place] FDA and  has been authorized for detection and/or diagnosis of SARS-CoV-2 by FDA under an Emergency Use Authorization (EUA). This EUA will remain  in effect (meaning this test can be used) for the duration of the COVID-19 declaration under Section 564(b)(1) of the Act, 21 U.S.C.section 360bbb-3(b)(1), unless the authorization is terminated  or revoked sooner.       Influenza A by PCR NEGATIVE NEGATIVE Final   Influenza B by PCR NEGATIVE NEGATIVE Final    Comment: (NOTE) The Xpert Xpress SARS-CoV-2/FLU/RSV plus assay is intended as an aid in the diagnosis of influenza from Nasopharyngeal swab specimens and should not be used as a sole basis for treatment. Nasal washings and aspirates are unacceptable for Xpert Xpress SARS-CoV-2/FLU/RSV testing.  Fact Sheet for Patients: EntrepreneurPulse.com.au  Fact Sheet for Healthcare Providers: IncredibleEmployment.be  This test is not yet approved or cleared by the [place] FDA and has been authorized for detection and/or diagnosis of SARS-CoV-2 by FDA under an Emergency Use Authorization (EUA). This EUA will remain in effect (meaning this test can be used) for the duration of the COVID-19 declaration under Section 564(b)(1) of the Act, 21 U.S.C. section 360bbb-3(b)(1), unless the authorization is terminated or revoked.  Performed at [facility], [place] [address], [place], [place] [address]          Radiology Studies: DG Chest Port 1 View  Result Date: 07/04/2020 CLINICAL DATA:  Anemia. EXAM: PORTABLE CHEST 1 VIEW COMPARISON:  06/10/2020  FINDINGS: 1322 hours. The cardio pericardial silhouette is enlarged. Right base collapse/consolidation with small right pleural effusion, similar to prior. Airspace disease with left fusion seen at the left base previously has almost resolved in the interval. There is pulmonary vascular congestion without overt pulmonary edema. Bones are diffusely demineralized. Telemetry leads overlie the chest. IMPRESSION: 1. Interval improvement in left base aeration. 2. Persistent right base collapse/consolidation with small right pleural effusion. Electronically Signed   By: Misty Stanley M.D.   On: 07/04/2020 13:49        Scheduled Meds: . amiodarone  200 mg Oral Daily  . atorvastatin  10 mg Oral Daily  . doxazosin  2 mg Oral Daily  . ferrous sulfate  324 mg Oral  BID  . furosemide  60 mg Intravenous Q12H  . hydrocortisone  25 mg Rectal BID  . insulin aspart  0-9 Units Subcutaneous TID WC  . metoprolol succinate  100 mg Oral BID  . potassium chloride SA  20 mEq Oral Daily  . sodium chloride flush  3 mL Intravenous Q12H   Continuous Infusions:   LOS: 0 days    Time spent:35 mins, More than 50% of that time was spent in counseling and/or coordination of care.      Shelly Coss, MD [facility] P4/22/2022, 1:41 PM

## 2020-07-05 NOTE — Plan of Care (Signed)
  Problem: Education: Goal: Knowledge of General Education information will improve Description Including pain rating scale, medication(s)/side effects and non-pharmacologic comfort measures Outcome: Progressing   Problem: Health Behavior/Discharge Planning: Goal: Ability to manage health-related needs will improve Outcome: Progressing   

## 2020-07-05 NOTE — Progress Notes (Addendum)
Progress Note  Chief Complaint:    Rectal bleeding    Attending physician's note   I have taken an interval history, reviewed the chart and examined the patient. I agree with the Advanced Practitioner's note, impression and recommendations.   Small-volume bright red blood per rectum likely secondary to bleeding from internal hemorrhoids Continue Anusol suppository per rectum twice daily  We will hold off flexible sigmoidoscopy or colonoscopy given her age and recent colonoscopy with no evidence of neoplastic lesion in the rectum  Monitor hemoglobin Hold Eliquis for 1 week and can restart after that Advised family to discuss the need for long-term anticoagulation with PMD  Follow-up in GI office with Dr. Hilarie Fredrickson in 4 to 6 weeks.  Okay to discharge home tomorrow if hemoglobin remains stable and no evidence of significant GI bleeding  The patient was provided an opportunity to ask questions and all were answered. The patient agreed with the plan and demonstrated an understanding of the instructions.  [name] , MD [phone]      ASSESSMENT / PLAN:    # 87 yo female with rectal bleeding on Eliquis. Suspect hemorrhoidal bleeding. She has a history of chronic colitis ( untreated) but not having loose stool so doubt etiology of bleeding. Diverticular hemorrhage possible but seems less likely. She was for discharge today but cancelled due to another episode of bleeding. Her hgb drifted overnight from 9.2 to 8.6.  --I spoke with RN. Patient's granddaughter saw blood in her diaper when she changed her this am. I called Granddaughter [place] who said it was just "drops" of blood. On exam there is no blood in vault , scant dark green stool.  --Continue BID Anusol suppositories.     # Hx of adenomatous colon polyps. A 40 mm tranverse colon poly was resected by [place] in December. Pathology showed high grade dysplasia with possible focus of adenocarcinoma. Patient nor daugter  wanted to pursue surveillance colonoscopy despite out concern for cancer.   # Chronic active colitis on colon biopsies in December. Untreated. [place] discussed at last office visit in March but not yet started. She has a history of loose stools but lately her BMs have been solid / soft.        SUBJECTIVE:   Feels okay. Says she had a few drops of blood with BM today. No abdominal or rectal pain.     OBJECTIVE:    Scheduled inpatient medications:  . amiodarone  200 mg Oral Daily  . atorvastatin  10 mg Oral Daily  . doxazosin  2 mg Oral Daily  . ferrous sulfate  324 mg Oral BID  . furosemide  60 mg Intravenous Q12H  . hydrocortisone  25 mg Rectal BID  . insulin aspart  0-9 Units Subcutaneous TID WC  . metoprolol succinate  100 mg Oral BID  . potassium chloride SA  20 mEq Oral Daily  . sodium chloride flush  3 mL Intravenous Q12H   Continuous inpatient infusions:  PRN inpatient medications: acetaminophen **OR** acetaminophen  Vital signs in last 24 hours: Temp:  [97.5 F (36.4 C)-98.4 F (36.9 C)] 98.1 F (36.7 C) (04/22 0823) Pulse Rate:  [75-96] 96 (04/22 0823) Resp:  [14-21] 18 (04/22 0823) BP: (118-153)/(61-98) 135/91 (04/22 0823) SpO2:  [96 %-100 %] 99 % (04/22 0823) Last BM Date: 07/05/20  Intake/Output Summary (Last 24 hours) at 07/05/2020 1350 Last data filed at 07/05/2020 1100 Gross per 24 hour  Intake 480 ml  Output 2  ml  Net 478 ml     Physical Exam:  . General: Alert female in NAD . Heart:  Regular rate, irreg rhythm. No lower extremity edema . Pulmonary: Normal respiratory effort . Abdomen: Soft, nondistended, nontender. Normal bowel sounds.  . Rectal: scant soft dark green stool in vault. No blood . Neurologic: Alert and oriented . Psych: Pleasant. Cooperative.   There were no vitals filed for this visit.  Intake/Output from previous day: 04/21 0701 - 04/22 0700 In: -  Out: 2 [Urine:1; Stool:1] Intake/Output this shift: Total I/O In: 480  [P.O.:480] Out: -     Lab Results: Recent Labs    07/04/20 1420 07/04/20 1700 07/05/20 0051  WBC 3.5*  --  3.1*  HGB 9.9* 9.2* 8.6*  HCT 30.5* 28.4* 26.9*  PLT 165  --  135*   BMET Recent Labs    07/04/20 1203 07/05/20 0051  NA 139 140  K 3.8 3.8  CL 105 105  CO2 25 26  GLUCOSE 170* 137*  BUN 29* 30*  CREATININE 1.56* 1.47*  CALCIUM 8.5* 8.2*   LFT Recent Labs    07/04/20 1203  PROT 5.2*  ALBUMIN 2.8*  AST 43*  ALT 32  ALKPHOS 34*  BILITOT 0.8  BILIDIR 0.3*  IBILI 0.5   PT/INR Recent Labs    07/04/20 1238  LABPROT 20.0*  INR 1.7*   Hepatitis Panel No results for input(s): HEPBSAG, HCVAB, HEPAIGM, HEPBIGM in the last 72 hours.  DG Chest Port 1 View  Result Date: 07/04/2020 CLINICAL DATA:  Anemia. EXAM: PORTABLE CHEST 1 VIEW COMPARISON:  06/10/2020 FINDINGS: 1322 hours. The cardio pericardial silhouette is enlarged. Right base collapse/consolidation with small right pleural effusion, similar to prior. Airspace disease with left fusion seen at the left base previously has almost resolved in the interval. There is pulmonary vascular congestion without overt pulmonary edema. Bones are diffusely demineralized. Telemetry leads overlie the chest. IMPRESSION: 1. Interval improvement in left base aeration. 2. Persistent right base collapse/consolidation with small right pleural effusion. Electronically Signed   By: Misty Stanley M.D.   On: 07/04/2020 13:49        Principal Problem:   Rectal bleeding Active Problems:   Essential hypertension, benign   Anticoagulated   Controlled diabetes mellitus type 2 with complications (HCC)   Paroxysmal atrial fibrillation (HCC)   Stage 3b chronic kidney disease (HCC)   Macrocytic anemia   Prolonged QT interval   History of breast cancer   Rectal bleed     LOS: 0 days   Tye Savoy ,NP 07/05/2020, 1:50 PM

## 2020-07-06 ENCOUNTER — Encounter (HOSPITAL_COMMUNITY): Payer: Self-pay | Admitting: Internal Medicine

## 2020-07-06 LAB — CBC WITH DIFFERENTIAL/PLATELET
Abs Immature Granulocytes: 0.01 10*3/uL (ref 0.00–0.07)
Basophils Absolute: 0 10*3/uL (ref 0.0–0.1)
Basophils Relative: 0 %
Eosinophils Absolute: 0 10*3/uL (ref 0.0–0.5)
Eosinophils Relative: 1 %
HCT: 25.9 % — ABNORMAL LOW (ref 36.0–46.0)
Hemoglobin: 8.4 g/dL — ABNORMAL LOW (ref 12.0–15.0)
Immature Granulocytes: 0 %
Lymphocytes Relative: 17 %
Lymphs Abs: 0.6 10*3/uL — ABNORMAL LOW (ref 0.7–4.0)
MCH: 35.4 pg — ABNORMAL HIGH (ref 26.0–34.0)
MCHC: 32.4 g/dL (ref 30.0–36.0)
MCV: 109.3 fL — ABNORMAL HIGH (ref 80.0–100.0)
Monocytes Absolute: 0.3 10*3/uL (ref 0.1–1.0)
Monocytes Relative: 10 %
Neutro Abs: 2.4 10*3/uL (ref 1.7–7.7)
Neutrophils Relative %: 72 %
Platelets: 137 10*3/uL — ABNORMAL LOW (ref 150–400)
RBC: 2.37 MIL/uL — ABNORMAL LOW (ref 3.87–5.11)
RDW: 15.2 % (ref 11.5–15.5)
WBC: 3.3 10*3/uL — ABNORMAL LOW (ref 4.0–10.5)
nRBC: 0 % (ref 0.0–0.2)

## 2020-07-06 LAB — BASIC METABOLIC PANEL
Anion gap: 6 (ref 5–15)
BUN: 30 mg/dL — ABNORMAL HIGH (ref 8–23)
CO2: 30 mmol/L (ref 22–32)
Calcium: 8.2 mg/dL — ABNORMAL LOW (ref 8.9–10.3)
Chloride: 105 mmol/L (ref 98–111)
Creatinine, Ser: 1.61 mg/dL — ABNORMAL HIGH (ref 0.44–1.00)
GFR, Estimated: 30 mL/min — ABNORMAL LOW (ref >60)
Glucose, Bld: 123 mg/dL — ABNORMAL HIGH (ref 70–99)
Potassium: 3.2 mmol/L — ABNORMAL LOW (ref 3.5–5.1)
Sodium: 141 mmol/L (ref 135–145)

## 2020-07-06 LAB — GLUCOSE, CAPILLARY: Glucose-Capillary: 145 mg/dL — ABNORMAL HIGH (ref 70–99)

## 2020-07-06 MED ORDER — POTASSIUM CHLORIDE CRYS ER 20 MEQ PO TBCR
40.0000 meq | EXTENDED_RELEASE_TABLET | Freq: Once | ORAL | Status: AC
  Administered 2020-07-06: 40 meq via ORAL
  Filled 2020-07-06: qty 2

## 2020-07-06 MED ORDER — HYDROCORTISONE ACETATE 25 MG RE SUPP: 25.0000 mg | Freq: Two times a day (BID) | RECTAL | 0 refills | Status: AC

## 2020-07-06 MED ORDER — APIXABAN 2.5 MG PO TABS: 2.5000 mg | ORAL_TABLET | Freq: Two times a day (BID) | ORAL | 3 refills | Status: DC

## 2020-07-06 NOTE — Plan of Care (Signed)
?  Problem: Activity: ?Goal: Risk for activity intolerance will decrease ?Outcome: Progressing ?  ?Problem: Safety: ?Goal: Ability to remain free from injury will improve ?Outcome: Progressing ?  ?Problem: Pain Managment: ?Goal: General experience of comfort will improve ?Outcome: Progressing ?  ?

## 2020-07-06 NOTE — Progress Notes (Signed)
Family member returned call and was notified of patient D/C.

## 2020-07-06 NOTE — Progress Notes (Signed)
Discharge package printed and instructions given to daughter and grand daughter. Verbalizing understanding.

## 2020-07-06 NOTE — Progress Notes (Signed)
Called family member to notified D/C. Unable to get in touch. Will call back.

## 2020-07-06 NOTE — Discharge Summary (Signed)
Physician Discharge Summary  Whitney [name] BTD:[id number] DOB: 1927-06-13 DOA: 07/04/2020  PCP: [name], MD  Admit date: 07/04/2020 Discharge date: 07/06/2020  Admitted From: Home Disposition:  Home  Discharge Condition:Stable CODE STATUS:FULL Diet recommendation: Heart Healthy  Patient is a 87 year old female with history of hypertension, hyperlipidemia, paroxysmal A. fib on Eliquis, history of breast cancer status postlumpectomy, diabetes type 2, hypothyroidism, tubular adenoma status post removal who presented with complaints of rectal bleeding for 2 days before admission.  She lives with her granddaughter.  She was seen at her PCPs office where her hemoglobin was found to be 7.4 and was sent for further evaluation.Her last colonoscopy had shown internal hemorrhoids.  On presentation, her hemoglobin was stable in the range of 9.9.  She was found to have severe bilateral lower extremity edema.  FOBT was positive.  GI was consulted with no plan for intervention right now and the plan is for monitoring.  Rectal bleeding suspected secondary to internal hemorrhoids, started on Anusol.  Plan for discharge today to home, hemoglobin stable.  Following problems were addressed during her hospitalization:  Hematochezia: History of internal hemorrhoids.  Currently on Anusol.  Hemoglobin stable in the range of 8-9.  Hb stable this mrng.  GI were following. She follows with 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  It was discussed with the patient and did not want to pursue surveillance colonoscopy  Macrocytic anemia: Continue monitoring as an outpatient.  Vitamin T06 and folic acid normal.  Paroxysmal A. fib: Currently heart rate is well controlled.  Takes Eliquis at home.  Also on metoprolol and amiodarone.  We recommend to continue Eliquis only after a week.   Headaches of bleeding for this elderly female.  We recommend to follow-up with PCP for discussion about the risks and benefit of continuing anticoagulation   acute on chronic diastolic congestive heart failure: Last echo showed ejection fraction of 26-94%, grade 2 diastolic dysfunction.  She has 2-3+ bilateral lower extremity pitting edema which is chronic.  She was noted to be fluid overloaded on presentation.  BNP elevated.  Chest x-ray showed a small right-sided pleural effusion but her lungs are clear.  She was given few doses of IV Lasix during this hospitalization She takes Lasix 80 mg twice daily at home.  History of ulcerative colitis: Follows with GI, currently stable  Leukopenia: Chronic.  Continue to monitor  CKD stage IIIb: Baseline creatinine around 1.2-1.6.    Currently kidney function close to baseline  Prolonged QTC: QTC prolonged at 706 on presentation, improved to 497  History of hypertension: Currently stable.  Continue current medications  Diabetes type 2: Controlled.  Recent hemoglobin A1c of 5.8.  Continue current insulin regimen.  Takes Januvia at home  Hypothyroidism: On levothyroxine   History of breast cancer: Status post right lumpectomy.  On tamoxifen at home    Discharge Diagnoses:  Principal Problem:   Rectal bleeding Active Problems:   Essential hypertension, benign   Chronic anticoagulation   Controlled diabetes mellitus type 2 with complications (HCC)   Paroxysmal atrial fibrillation (HCC)   Stage 3b chronic kidney disease (HCC)   Macrocytic anemia   Prolonged QT interval   History of breast cancer   Rectal bleed    Discharge Instructions  Discharge Instructions    Diet - low sodium heart healthy   Complete by: As directed    Discharge instructions  Complete by: As directed    1)Please take prescribed medications as instructed 2)Follow up with her PCP in a week.  Do a CBC, BMP test during the follow-up.  Discuss with your PCP  about the need of continuation of Eliquis 3)Resume Eliquis only on 07/12/20 4)Follow up with your gastroenterologist as an outpatient.   Increase activity slowly   Complete by: As directed      Allergies as of 07/06/2020      Reactions   Tape Other (See Comments)   Skin is somewhat sensitive      Medication List    TAKE these medications   amiodarone 200 MG tablet Commonly known as: PACERONE Take 1 tablet (200 mg total) by mouth daily.   apixaban 2.5 MG Tabs tablet Commonly known as: Eliquis Take 1 tablet (2.5 mg total) by mouth 2 (two) times daily. Provider has put Patient on 2.5 mg twice daily. Resume only after a week (on 07/12/20) What changed: additional instructions   atorvastatin 10 MG tablet Commonly known as: LIPITOR Take 1 tablet (10 mg total) by mouth daily.   cholecalciferol 25 MCG (1000 UNIT) tablet Commonly known as: VITAMIN D3 TAKE 1 TAB BY MOUTH EVERY DAY What changed: See the new instructions.   docusate sodium 100 MG capsule Commonly known as: COLACE Take 100 mg by mouth daily.   doxazosin 4 MG tablet Commonly known as: CARDURA Take 0.5 tablets (2 mg total) by mouth daily.   ferrous sulfate 324 (65 Fe) MG Tbec TAKE 1 TABLET BY MOUTH IN THE MORNING AND 1 AT BEDTIME What changed: See the new instructions.   furosemide 40 MG tablet Commonly known as: LASIX Take 2 tablets (80 mg total) by mouth daily. What changed: when to take this   hydrocortisone 25 MG suppository Commonly known as: ANUSOL-HC Place 1 suppository (25 mg total) rectally 2 (two) times daily for 8 days.   Januvia 100 MG tablet Generic drug: sitaGLIPtin TAKE 0.5 TAB BY MOUTH DAILY What changed: See the new instructions.   Klor-Con M20 20 MEQ tablet Generic drug: potassium chloride SA TAKE 1 TABLET BY MOUTH EVERY DAY What changed: how much to take   meclizine 12.5 MG tablet Commonly known as: ANTIVERT TAKE 1 TABLET BY MOUTH DAILY AS NEEDED FOR DIZZINESS What changed: See  the new instructions.   metoprolol succinate 100 MG 24 hr tablet Commonly known as: TOPROL-XL TAKE 1 TABLET BY MOUTH TWICE A DAY WITH OR IMMEDIATELY FOLLOWING A MEAL What changed: See the new instructions.   OneTouch Verio test strip Generic drug: glucose blood CHECK BLOOD SUGAR EVERY DAY AS DIRECTED   OneTouch Verio w/Device Kit Use to test blood sugars daily.   polyethylene glycol 17 g packet Commonly known as: MIRALAX / GLYCOLAX Take 17 g by mouth daily. Also available OTC What changed:   when to take this  reasons to take this  additional instructions   tamoxifen 20 MG tablet Commonly known as: NOLVADEX TAKE 1 TABLET BY [place]    [name], MD. Schedule an appointment as soon as possible for a visit in 1 week(s).   Specialty: Family Medicine Contact information: [address] [place] [address] [phone]              Allergies  Allergen Reactions  . Tape Other (See Comments)    Skin is somewhat sensitive    Consultations:  GI   Procedures/Studies:  DG Chest 2 View  Result Date: 06/11/2020 CLINICAL DATA:  Volume overload with lower extremity edema. Right pleural effusion EXAM: CHEST - 2 VIEW COMPARISON:  May 20, 2020 FINDINGS: Pleural effusions noted bilaterally with bibasilar atelectasis. Lungs elsewhere are clear. Heart size is upper normal with pulmonary vascularity within normal limits. No adenopathy. There is aortic atherosclerosis. Postoperative change noted in right breast region. Bones are osteoporotic. IMPRESSION: Pleural effusions bilaterally with bibasilar atelectasis. Lungs elsewhere clear. Heart size upper normal. Aortic Atherosclerosis (ICD10-I70.0). Electronically Signed   By: Lowella Grip III M.D.   On: 06/11/2020 10:10   DG Chest Port 1 View  Result Date: 07/04/2020 CLINICAL DATA:  Anemia. EXAM: PORTABLE CHEST 1 VIEW COMPARISON:  06/10/2020 FINDINGS: 1322 hours. The  cardio pericardial silhouette is enlarged. Right base collapse/consolidation with small right pleural effusion, similar to prior. Airspace disease with left fusion seen at the left base previously has almost resolved in the interval. There is pulmonary vascular congestion without overt pulmonary edema. Bones are diffusely demineralized. Telemetry leads overlie the chest. IMPRESSION: 1. Interval improvement in left base aeration. 2. Persistent right base collapse/consolidation with small right pleural effusion. Electronically Signed   By: Misty Stanley M.D.   On: 07/04/2020 13:49       Subjective: Patient seen and examined the bedside this morning.  Hemodynamically stable for discharge today.  Discharge Exam: Vitals:   07/06/20 0500 07/06/20 0747  BP: 127/78 138/89  Pulse: 79 100  Resp: 18 17  Temp: 98.4 F (36.9 C) 98 F (36.7 C)  SpO2: 98% 96%   Vitals:   07/05/20 1500 07/05/20 2107 07/06/20 0500 07/06/20 0747  BP: 127/78 122/76 127/78 138/89  Pulse: 83 89 79 100  Resp: 17 17 18 17   Temp: 97.7 F (36.5 C) 98 F (36.7 C) 98.4 F (36.9 C) 98 F (36.7 C)  TempSrc: Oral Oral Oral Oral  SpO2: 100% 100% 98% 96%    General: Pt is alert, awake, not in acute distress Cardiovascular: RRR, S1/S2 +, no rubs, no gallops Respiratory: CTA bilaterally, no wheezing, no rhonchi Abdominal: Soft, NT, ND, bowel sounds + Extremities: Trace bilateral lower extremity edema, no cyanosis    The results of significant diagnostics from this hospitalization (including imaging, microbiology, ancillary and laboratory) are listed below for reference.     Microbiology: Recent Results (from the past 240 hour(s))  Resp Panel by RT-PCR (Flu A&B, Covid) Nasopharyngeal Swab     Status: None   Collection Time: 07/04/20 12:39 PM   Specimen: Nasopharyngeal Swab; Nasopharyngeal(NP) swabs in vial transport medium  Result Value Ref Range Status   SARS Coronavirus 2 by RT PCR NEGATIVE NEGATIVE Final     Comment: (NOTE) SARS-CoV-2 target nucleic acids are NOT DETECTED.  The SARS-CoV-2 RNA is generally detectable in upper respiratory specimens during the acute phase of infection. The lowest concentration of SARS-CoV-2 viral copies this assay can detect is 138 copies/mL. A negative result does not preclude SARS-Cov-2 infection and should not be used as the sole basis for treatment or other patient management decisions. A negative result may occur with  improper specimen collection/handling, submission of specimen other than nasopharyngeal swab, presence of viral mutation(s) within the areas targeted by this assay, and inadequate number of viral copies(<138 copies/mL). A negative result must be combined with clinical observations, patient history, and epidemiological information. The expected result is Negative.  Fact Sheet for Patients:  EntrepreneurPulse.com.au  Fact Sheet for Healthcare Providers:  IncredibleEmployment.be  This test is no t  yet approved or cleared by the [place] and  has been authorized for detection and/or diagnosis of SARS-CoV-2 by FDA under an Emergency Use Authorization (EUA). This EUA will remain  in effect (meaning this test can be used) for the duration of the COVID-19 declaration under Section 564(b)(1) of the Act, 21 U.S.C.section 360bbb-3(b)(1), unless the authorization is terminated  or revoked sooner.       Influenza A by PCR NEGATIVE NEGATIVE Final   Influenza B by PCR NEGATIVE NEGATIVE Final    Comment: (NOTE) The Xpert Xpress SARS-CoV-2/FLU/RSV plus assay is intended as an aid in the diagnosis of influenza from Nasopharyngeal swab specimens and should not be used as a sole basis for treatment. Nasal washings and aspirates are unacceptable for Xpert Xpress SARS-CoV-2/FLU/RSV testing.  Fact Sheet for Patients: EntrepreneurPulse.com.au  Fact Sheet for Healthcare  Providers: IncredibleEmployment.be  This test is not yet approved or cleared by the [place] FDA and has been authorized for detection and/or diagnosis of SARS-CoV-2 by FDA under an Emergency Use Authorization (EUA). This EUA will remain in effect (meaning this test can be used) for the duration of the COVID-19 declaration under Section 564(b)(1) of the Act, 21 U.S.C. section 360bbb-3(b)(1), unless the authorization is terminated or revoked.  Performed at [facility], [place] [address], [place], [place] [address]      Labs: BNP (last 3 results) Recent Labs    06/14/20 1144 07/04/20 1700  BNP 846* 031.5*   Basic Metabolic Panel: Recent Labs  Lab 07/04/20 1203 07/04/20 1700 07/05/20 0051 07/06/20 0156  NA 139  --  140 141  K 3.8  --  3.8 3.2*  CL 105  --  105 105  CO2 25  --  26 30  GLUCOSE 170*  --  137* 123*  BUN 29*  --  30* 30*  CREATININE 1.56*  --  1.47* 1.61*  CALCIUM 8.5*  --  8.2* 8.2*  MG  --  1.8  --   --    Liver Function Tests: Recent Labs  Lab 07/04/20 1203  AST 43*  ALT 32  ALKPHOS 34*  BILITOT 0.8  PROT 5.2*  ALBUMIN 2.8*   No results for input(s): LIPASE, AMYLASE in the last 168 hours. No results for input(s): AMMONIA in the last 168 hours. CBC: Recent Labs  Lab 07/04/20 1420 07/04/20 1700 07/05/20 0051 07/06/20 0156  WBC 3.5*  --  3.1* 3.3*  NEUTROABS  --   --   --  2.4  HGB 9.9* 9.2* 8.6* 8.4*  HCT 30.5* 28.4* 26.9* 25.9*  MCV 110.1*  --  110.2* 109.3*  PLT 165  --  135* 137*   Cardiac Enzymes: No results for input(s): CKTOTAL, CKMB, CKMBINDEX, TROPONINI in the last 168 hours. BNP: Invalid input(s): POCBNP CBG: Recent Labs  Lab 07/05/20 0643 07/05/20 1217 07/05/20 1457 07/05/20 2131  GLUCAP 146* 148* 152* 125*   D-Dimer No results for input(s): DDIMER in the last 72 hours. Hgb A1c No results for input(s): HGBA1C in the last 72 hours. Lipid Profile No results for input(s): CHOL, HDL,  LDLCALC, TRIG, CHOLHDL, LDLDIRECT in the last 72 hours. Thyroid function studies Recent Labs    07/04/20 1238  TSH 1.917   Anemia work up Recent Labs    07/04/20 1238  VITAMINB12 1,538*  FOLATE 12.5   Urinalysis    Component Value Date/Time   COLORURINE STRAW (A) 07/05/2018 1945   APPEARANCEUR CLEAR 07/05/2018 1945   LABSPEC 1.006  07/05/2018 1945   PHURINE 7.0 07/05/2018 1945   GLUCOSEU NEGATIVE 07/05/2018 1945   GLUCOSEU NEGATIVE 11/24/2012 0831   HGBUR NEGATIVE 07/05/2018 1945   BILIRUBINUR NEGATIVE 07/05/2018 1945   BILIRUBINUR neg 09/19/2014 [place] 07/05/2018 1945   PROTEINUR NEGATIVE 07/05/2018 1945   UROBILINOGEN 0.2 09/19/2014 1605   UROBILINOGEN 0.2 02/24/2014 0024   NITRITE NEGATIVE 07/05/2018 1945   LEUKOCYTESUR NEGATIVE 07/05/2018 1945   Sepsis Labs Invalid input(s): PROCALCITONIN,  WBC,  LACTICIDVEN Microbiology Recent Results (from the past 240 hour(s))  Resp Panel by RT-PCR (Flu A&B, Covid) Nasopharyngeal Swab     Status: None   Collection Time: 07/04/20 12:39 PM   Specimen: Nasopharyngeal Swab; Nasopharyngeal(NP) swabs in vial transport medium  Result Value Ref Range Status   SARS Coronavirus 2 by RT PCR NEGATIVE NEGATIVE Final    Comment: (NOTE) SARS-CoV-2 target nucleic acids are NOT DETECTED.  The SARS-CoV-2 RNA is generally detectable in upper respiratory specimens during the acute phase of infection. The lowest concentration of SARS-CoV-2 viral copies this assay can detect is 138 copies/mL. A negative result does not preclude SARS-Cov-2 infection and should not be used as the sole basis for treatment or other patient management decisions. A negative result may occur with  improper specimen collection/handling, submission of specimen other than nasopharyngeal swab, presence of viral mutation(s) within the areas targeted by this assay, and inadequate number of viral copies(<138 copies/mL). A negative result must be combined  with clinical observations, patient history, and epidemiological information. The expected result is Negative.  Fact Sheet for Patients:  EntrepreneurPulse.com.au  Fact Sheet for Healthcare Providers:  IncredibleEmployment.be  This test is no t yet approved or cleared by the [place] FDA and  has been authorized for detection and/or diagnosis of SARS-CoV-2 by FDA under an Emergency Use Authorization (EUA). This EUA will remain  in effect (meaning this test can be used) for the duration of the COVID-19 declaration under Section 564(b)(1) of the Act, 21 U.S.C.section 360bbb-3(b)(1), unless the authorization is terminated  or revoked sooner.       Influenza A by PCR NEGATIVE NEGATIVE Final   Influenza B by PCR NEGATIVE NEGATIVE Final    Comment: (NOTE) The Xpert Xpress SARS-CoV-2/FLU/RSV plus assay is intended as an aid in the diagnosis of influenza from Nasopharyngeal swab specimens and should not be used as a sole basis for treatment. Nasal washings and aspirates are unacceptable for Xpert Xpress SARS-CoV-2/FLU/RSV testing.  Fact Sheet for Patients: EntrepreneurPulse.com.au  Fact Sheet for Healthcare Providers: IncredibleEmployment.be  This test is not yet approved or cleared by the [place] FDA and has been authorized for detection and/or diagnosis of SARS-CoV-2 by FDA under an Emergency Use Authorization (EUA). This EUA will remain in effect (meaning this test can be used) for the duration of the COVID-19 declaration under Section 564(b)(1) of the Act, 21 U.S.C. section 360bbb-3(b)(1), unless the authorization is terminated or revoked.  Performed at [facility], [place] [address] [place], [place] [address]     Please note: You were cared for by a hospitalist during your hospital stay. Once you are discharged, your primary care physician will handle any further medical issues. Please  note that NO REFILLS for any discharge medications will be authorized once you are discharged, as it is imperative that you return to your primary care physician (or establish a relationship with a primary care physician if you do not have one) for your post hospital discharge needs so that  they can reassess your need for medications and monitor your lab values.    Time coordinating discharge: 40 minutes  SIGNED:   Shelly Coss, MD  [facility] 07/06/2020, 10:27 AM Pager [phone]  If 7PM-7AM, please contact night-coverage [email or web address] Password [email or web address]

## 2020-07-08 ENCOUNTER — Telehealth: Payer: Self-pay | Admitting: Family Medicine

## 2020-07-08 NOTE — Telephone Encounter (Signed)
Transition Care Management Follow-up Telephone Call  Date of discharge and from where: 07/06/2020 from [facility]   How have you been since you were released from the hospital? Patient states she is doing well   Any questions or concerns? No  Items Reviewed:  Did the pt receive and understand the discharge instructions provided? Yes   Medications obtained and verified? Yes   Other? No   Any new allergies since your discharge? No   Dietary orders reviewed? Yes  Do you have support at home? Yes   Home Care and Equipment/Supplies: Were home health services ordered? no If so, what is the name of the agency? N/A   Has the agency set up a time to come to the patient's home? not applicable Were any new equipment or medical supplies ordered?  No What is the name of the medical supply agency? No Were you able to get the supplies/equipment? not applicable Do you have any questions related to the use of the equipment or supplies? No  Functional Questionnaire: (I = Independent and D = Dependent) ADLs: I with assistance   Bathing/Dressing- needs assistance   Meal Prep- D   Eating- I   Maintaining continence- I  Transferring/Ambulation- I with assistance   Managing Meds- I  Follow up appointments reviewed:   PCP Hospital f/u appt confirmed? Yes  Scheduled to see Dr. Dennard Schaumann on 07/12/2020 @ 11:15 am .  [facility] f/u appt confirmed? No    Are transportation arrangements needed? No   If their condition worsens, is the pt aware to call PCP or go to the Emergency Dept.? Yes  Was the patient provided with contact information for the PCP's office or ED? Yes  Was to pt encouraged to call back with questions or concerns? Yes ,

## 2020-07-12 ENCOUNTER — Telehealth: Payer: Self-pay | Admitting: *Deleted

## 2020-07-12 ENCOUNTER — Encounter: Payer: Self-pay | Admitting: Family Medicine

## 2020-07-12 ENCOUNTER — Ambulatory Visit (INDEPENDENT_AMBULATORY_CARE_PROVIDER_SITE_OTHER): Payer: Medicare Other | Admitting: Family Medicine

## 2020-07-12 ENCOUNTER — Other Ambulatory Visit: Payer: Self-pay

## 2020-07-12 VITALS — BP 132/84 | HR 88 | Temp 97.9°F | Resp 16

## 2020-07-12 DIAGNOSIS — K625 Hemorrhage of anus and rectum: Secondary | ICD-10-CM

## 2020-07-12 NOTE — Progress Notes (Signed)
Subjective:    Patient ID: Whitney [name], female    DOB: 06-02-1927, 87 y.o.   MRN: [id number]  07/04/20 Patient had a colonoscopy December 27 which revealed a 40 mm large polyp that was biopsied.  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  Patient was given the option over a partial colectomy versus rechecking in 3 to 4 months with a colonoscopy to see if the lesion had been removed in its entirety.  Hemoglobin at that time was 10.3.  During the colonoscopy she was also found to have internal hemorrhoids.  She is on Eliquis for anticoagulation due to atrial fibrillation.  Tuesday, the patient was taking a bath.  When she stood up and got off of her shower seat, the seat was covered in blood.  Her granddaughter estimates that it was a cupful of blood all over the shower seat.  Since that time blood has been painlessly dripping from her rectum every time she has to go to the bathroom.  She also reports dark-colored stools.  Hemoglobin on fingerstick today was initially found to be 6.  We repeated it and found it to be 7.4.  Therefore I estimate that her hemoglobin is 7.  This is an acute drop from 10.3.  She is still taking her Eliquis and she is still seeing blood in the stool.  At that time, my plan was: Patient has an active lower GI bleed.  Her hemoglobin has dropped precipitously from 10.3 down to around 7.  Patient's heart rate and blood pressure are stable. She is not in shock.  She certainly needs to discontinue her anticoagulant.  However given the level of her anemia, I am not certain that the bleeding will stop simply by discontinuing Eliquis before her hemoglobin becomes dangerously low.  Therefore I recommended that she go to the emergency room.  I believe that she would benefit from surveillance in the hospital with frequent hemoglobin checks after discontinuing her anticoagulant to determine if she is losing sufficient blood to require  transfusion.  She may also require repeat colonoscopy if the bleeding does not stop on its own.  I believe that she is too frail given her age to do this as an outpatient in case the bleeding does not subside.  Therefore I recommended that she go to the emergency room.  07/12/20 At the hospital, the patient's hemoglobin on admission was 9.9.  Therefore she did not receive a transfusion.  On the day of discharge her hemoglobin was 8.4 and considered stable.  She was discharged home and was restarted on her Eliquis.  She is here today on Eliquis.  She is using Anusol suppositories for hemorrhoidal bleeding.  However she still reports trace bleeding whenever she wipes although she thinks that is getting better on Anusol.  She denies any chest pain shortness of breath dyspnea on exertion or dizziness.  She denies any syncope.  Past Surgical History:  Procedure Laterality Date  . ABDOMINAL HYSTERECTOMY    . AXILLARY LYMPH NODE DISSECTION Right 01/09/2014   Procedure: RIGHT AXILLARY LYMPH NODE DISECTION;  Surgeon: [name], MD;  Location: [place];  Service: General;  Laterality: Right;  . BIOPSY  10/26/2019   Procedure: BIOPSY;  Surgeon: Rush Landmark Telford Nab., MD;  Location: [facility];  Service: Gastroenterology;;  . BIOPSY  03/11/2020   Procedure: BIOPSY;  Surgeon: Irving Copas., MD;  Location: [place];  Service: Gastroenterology;;  . BREAST LUMPECTOMY WITH RADIOACTIVE SEED  LOCALIZATION Right 01/09/2014   Procedure: RIGHT BREAST SEED LOCALIZED LUMPECTOMY ;  Surgeon: [name], MD;  Location: [place];  Service: General;  Laterality: Right;  . COLONOSCOPY WITH PROPOFOL N/A 10/26/2019   Procedure: COLONOSCOPY WITH PROPOFOL;  Surgeon: Rush Landmark Telford Nab., MD;  Location: [facility];  Service: Gastroenterology;  Laterality: N/A;  . COLONOSCOPY WITH PROPOFOL N/A 03/11/2020   Procedure: COLONOSCOPY WITH PROPOFOL- Ultraslim scope;  Surgeon:  Irving Copas., MD;  Location: [place];  Service: Gastroenterology;  Laterality: N/A;  . ENDOSCOPIC MUCOSAL RESECTION N/A 03/11/2020   Procedure: ENDOSCOPIC MUCOSAL RESECTION;  Surgeon: Rush Landmark Telford Nab., MD;  Location: [place];  Service: Gastroenterology;  Laterality: N/A;  . ESOPHAGOGASTRODUODENOSCOPY (EGD) WITH PROPOFOL N/A 10/26/2019   Procedure: ESOPHAGOGASTRODUODENOSCOPY (EGD) WITH PROPOFOL;  Surgeon: Rush Landmark Telford Nab., MD;  [facility]: [facility];  Service: Gastroenterology;  Laterality: N/A;  . EYE SURGERY  12/22/2009   cataracts  . HEMOSTASIS CLIP PLACEMENT  10/26/2019   Procedure: HEMOSTASIS CLIP PLACEMENT;  Surgeon: Irving Copas., MD;  [facility]: [facility];  Service: Gastroenterology;;  marking for transverse lesion  . INTRAMEDULLARY (IM) NAIL INTERTROCHANTERIC Left 02/24/2014   Procedure: IM ROD LEFT HIP FX;  Surgeon: Alta Corning, MD;  Location: [place];  Service: Orthopedics;  Laterality: Left;  . left distal radius fracture     2020  . POLYPECTOMY  10/26/2019   Procedure: POLYPECTOMY;  Surgeon: Mansouraty, Telford Nab., MD;  Location: [facility];  Service: Gastroenterology;;  . POLYPECTOMY  03/11/2020   Procedure: POLYPECTOMY;  Surgeon: Irving Copas., MD;  Location: [place];  Service: Gastroenterology;;  . PORT-A-CATH REMOVAL Right 01/09/2014   Procedure: REMOVAL PORT-A-CATH;  Surgeon: [name], MD;  Location: [place];  Service: General;  Laterality: Right;  . PORTACATH PLACEMENT N/A 11/15/2013   Procedure: INSERTION PORT-A-CATH WITH ULTRA SOUND AND FLOROSCOPY;  Surgeon: [name], MD;  Location: [place];  Service: General;  Laterality: N/A;  . SUBMUCOSAL TATTOO INJECTION  10/26/2019   Procedure: SUBMUCOSAL TATTOO INJECTION;  Surgeon: Irving Copas., MD;  [facility];  Service: Gastroenterology;;  . TOTAL KNEE ARTHROPLASTY  2002   left   Current Outpatient Medications on File  Prior to Visit  Medication Sig Dispense Refill  . amiodarone (PACERONE) 200 MG tablet Take 1 tablet (200 mg total) by mouth daily. 90 tablet 3  . apixaban (ELIQUIS) 2.5 MG TABS tablet Take 1 tablet (2.5 mg total) by mouth 2 (two) times daily. Provider has put Patient on 2.5 mg twice daily. Resume only after a week (on 07/12/20) 90 tablet 3  . atorvastatin (LIPITOR) 10 MG tablet Take 1 tablet (10 mg total) by mouth daily. 90 tablet 1  . Blood Glucose Monitoring Suppl (ONETOUCH VERIO) w/Device KIT Use to test blood sugars daily. 1 kit 0  . cholecalciferol (VITAMIN D3) 25 MCG (1000 UNIT) tablet TAKE 1 TAB BY MOUTH EVERY DAY (Patient taking differently: Take 1,000 Units by mouth daily.) 100 tablet 1  . docusate sodium (COLACE) 100 MG capsule Take 100 mg by mouth daily.    Marland Kitchen doxazosin (CARDURA) 4 MG tablet Take 0.5 tablets (2 mg total) by mouth daily.    . ferrous sulfate 324 (65 Fe) MG TBEC TAKE 1 TABLET BY MOUTH IN THE MORNING AND 1 AT BEDTIME (Patient taking differently: Take 324 mg by mouth 2 (two) times daily.) 180 tablet 1  . furosemide (LASIX) 40 MG tablet Take 2 tablets (80 mg total) by mouth daily. (Patient  taking differently: Take 80 mg by mouth 2 (two) times daily.) 90 tablet 2  . hydrocortisone (ANUSOL-HC) 25 MG suppository Place 1 suppository (25 mg total) rectally 2 (two) times daily for 8 days. 16 suppository 0  . JANUVIA 100 MG tablet TAKE 0.5 TAB BY MOUTH DAILY (Patient taking differently: Take 50 mg by mouth daily.) 45 tablet 3  . KLOR-CON M20 20 MEQ tablet TAKE 1 TABLET BY MOUTH EVERY DAY (Patient taking differently: Take 20 mEq by mouth daily.) 90 tablet 1  . meclizine (ANTIVERT) 12.5 MG tablet TAKE 1 TABLET BY MOUTH DAILY AS NEEDED FOR DIZZINESS (Patient taking differently: Take 12.5 mg by mouth as needed for dizziness.) 30 tablet 2  . metoprolol succinate (TOPROL-XL) 100 MG 24 hr tablet TAKE 1 TABLET BY MOUTH TWICE A DAY WITH OR IMMEDIATELY FOLLOWING A MEAL (Patient taking  differently: Take 100 mg by mouth 2 (two) times daily. WITH OR IMMEDIATELY FOLLOWING A MEAL) 180 tablet 1  . ONETOUCH VERIO test strip CHECK BLOOD SUGAR EVERY DAY AS DIRECTED 100 strip 2  . polyethylene glycol (MIRALAX / GLYCOLAX) 17 g packet Take 17 g by mouth daily. Also available OTC (Patient taking differently: Take 17 g by mouth as needed for mild constipation.) 30 each 0  . tamoxifen (NOLVADEX) 20 MG tablet TAKE 1 TABLET BY MOUTH EVERY DAY (Patient taking differently: Take 20 mg by mouth daily.) 90 tablet 3   Current Facility-Administered Medications on File Prior to Visit  Medication Dose Route Frequency Provider Last Rate Last Admin  . denosumab (PROLIA) injection 60 mg  60 mg Subcutaneous Q6 months [name], MD   60 mg at 04/19/20 1135   Allergies  Allergen Reactions  . Tape Other (See Comments)    Skin is somewhat sensitive   Social History   Socioeconomic History  . Marital status: Widowed    Spouse name: Not on file  . Number of children: 4  . Years of education: Not on file  . Highest education level: Not on file  Occupational History  . Not on file  Tobacco Use  . Smoking status: Former Smoker    Packs/day: 1.00    Years: 5.00    Pack years: 5.00    Quit date: 11/14/1958    Years since quitting: 61.7  . Smokeless tobacco: Never Used  Vaping Use  . Vaping Use: Never used  Substance and Sexual Activity  . Alcohol use: No    Alcohol/week: 0.0 standard drinks  . Drug use: No  . Sexual activity: Not Currently    Birth control/protection: Post-menopausal    Comment: menarche age 61, P52, first birth age 22, no HRT, menopause age 77  Other Topics Concern  . Not on file  Social History Narrative  . Not on file   Social Determinants of Health   Financial Resource Strain: Low Risk   . Difficulty of Paying Living Expenses: Not very hard  Food Insecurity: Not on file  Transportation Needs: Not on file  Physical Activity: Not on file  Stress: Not on file   Social Connections: Not on file  Intimate Partner Violence: Not on file      Review of Systems  All other systems reviewed and are negative.      Objective:   Physical Exam Vitals reviewed.  Constitutional:      General: She is not in acute distress.    Appearance: Normal appearance. She is not ill-appearing or toxic-appearing.  HENT:  Right Ear: Tympanic membrane and ear canal normal.     Left Ear: Tympanic membrane and ear canal normal.     Nose: No congestion or rhinorrhea.     Mouth/Throat:     Pharynx: No oropharyngeal exudate or posterior oropharyngeal erythema.  Cardiovascular:     Rate and Rhythm: Normal rate. Rhythm irregular.     Heart sounds: Normal heart sounds.  Pulmonary:     Effort: Pulmonary effort is normal. No respiratory distress.     Breath sounds: Examination of the right-lower field reveals decreased breath sounds. Examination of the left-lower field reveals rales. Decreased breath sounds and rales present. No wheezing or rhonchi.    Chest:     Chest wall: No tenderness.  Abdominal:     General: Abdomen is flat. Bowel sounds are normal. There is no distension.     Palpations: Abdomen is soft.     Tenderness: There is no abdominal tenderness. There is no guarding or rebound.  Musculoskeletal:     Right lower leg: Edema present.     Left lower leg: Edema present.  Lymphadenopathy:     Cervical: No cervical adenopathy.  Neurological:     Mental Status: She is alert.           Assessment & Plan:  Rectal bleed - Plan: CBC with Differential/Platelet, COMPLETE METABOLIC PANEL WITH GFR Given her history of lower GI bleed, adenocarcinoma of the colon, and her advanced age and fall risk, I believe that the risk of continuing Eliquis outweighs the benefit in prevention of stroke.  I explained to the patient that her risk of stroke is likely around 10% or less per year off the Eliquis.  However I feel that if she suffered a serious bleed from a  rectal source, a sudden drop in her hemoglobin could likely precipitate a cardiac event given her frail status.  Therefore I believe the risk of bleeding outweighs the preventative benefit of the Eliquis and I have recommended that she stop the Eliquis.  She agrees.

## 2020-07-12 NOTE — Telephone Encounter (Signed)
Received call from patient caregiver.   Inquired about dose of Atorvastatin.   Advised that patient should be taking 10mg  PO QD.   Verbalized understanding.

## 2020-07-13 LAB — COMPLETE METABOLIC PANEL WITH GFR
AG Ratio: 1.5 (calc) (ref 1.0–2.5)
ALT: 35 U/L — ABNORMAL HIGH (ref 6–29)
AST: 43 U/L — ABNORMAL HIGH (ref 10–35)
Albumin: 3.2 g/dL — ABNORMAL LOW (ref 3.6–5.1)
Alkaline phosphatase (APISO): 36 U/L — ABNORMAL LOW (ref 37–153)
BUN/Creatinine Ratio: 19 (calc) (ref 6–22)
BUN: 31 mg/dL — ABNORMAL HIGH (ref 7–25)
CO2: 27 mmol/L (ref 20–32)
Calcium: 8.7 mg/dL (ref 8.6–10.4)
Chloride: 102 mmol/L (ref 98–110)
Creat: 1.64 mg/dL — ABNORMAL HIGH (ref 0.60–0.88)
GFR, Est African American: 32 mL/min/{1.73_m2} — ABNORMAL LOW (ref >=60)
GFR, Est Non African American: 27 mL/min/{1.73_m2} — ABNORMAL LOW (ref >=60)
Globulin: 2.2 g/dL (calc) (ref 1.9–3.7)
Glucose, Bld: 158 mg/dL — ABNORMAL HIGH (ref 65–99)
Potassium: 3.7 mmol/L (ref 3.5–5.3)
Sodium: 141 mmol/L (ref 135–146)
Total Bilirubin: 0.6 mg/dL (ref 0.2–1.2)
Total Protein: 5.4 g/dL — ABNORMAL LOW (ref 6.1–8.1)

## 2020-07-13 LAB — CBC WITH DIFFERENTIAL/PLATELET
Absolute Monocytes: 368 cells/uL (ref 200–950)
Basophils Absolute: 20 cells/uL (ref 0–200)
Basophils Relative: 0.5 %
Eosinophils Absolute: 8 cells/uL — ABNORMAL LOW (ref 15–500)
Eosinophils Relative: 0.2 %
HCT: 29.9 % — ABNORMAL LOW (ref 35.0–45.0)
Hemoglobin: 9.9 g/dL — ABNORMAL LOW (ref 11.7–15.5)
Lymphs Abs: 488 cells/uL — ABNORMAL LOW (ref 850–3900)
MCH: 35 pg — ABNORMAL HIGH (ref 27.0–33.0)
MCHC: 33.1 g/dL (ref 32.0–36.0)
MCV: 105.7 fL — ABNORMAL HIGH (ref 80.0–100.0)
MPV: 11 fL (ref 7.5–12.5)
Monocytes Relative: 9.2 %
Neutro Abs: 3116 cells/uL (ref 1500–7800)
Neutrophils Relative %: 77.9 %
Platelets: 165 10*3/uL (ref 140–400)
RBC: 2.83 10*6/uL — ABNORMAL LOW (ref 3.80–5.10)
RDW: 12.6 % (ref 11.0–15.0)
Total Lymphocyte: 12.2 %
WBC: 4 10*3/uL (ref 3.8–10.8)

## 2020-07-19 ENCOUNTER — Ambulatory Visit: Payer: Medicare Other | Admitting: Endocrinology

## 2020-07-19 ENCOUNTER — Other Ambulatory Visit: Payer: Self-pay

## 2020-07-19 VITALS — BP 112/82 | HR 92 | Wt 159.4 lb

## 2020-07-19 DIAGNOSIS — I10 Essential (primary) hypertension: Secondary | ICD-10-CM | POA: Diagnosis not present

## 2020-07-19 DIAGNOSIS — N183 Chronic kidney disease, stage 3 unspecified: Secondary | ICD-10-CM

## 2020-07-19 DIAGNOSIS — E876 Hypokalemia: Secondary | ICD-10-CM

## 2020-07-19 DIAGNOSIS — E119 Type 2 diabetes mellitus without complications: Secondary | ICD-10-CM | POA: Diagnosis not present

## 2020-07-19 LAB — BASIC METABOLIC PANEL
BUN: 35 mg/dL — ABNORMAL HIGH (ref 6–23)
CO2: 27 mEq/L (ref 19–32)
Calcium: 8 mg/dL — ABNORMAL LOW (ref 8.4–10.5)
Chloride: 102 mEq/L (ref 96–112)
Creatinine, Ser: 1.64 mg/dL — ABNORMAL HIGH (ref 0.40–1.20)
GFR: 27.41 mL/min — ABNORMAL LOW (ref >60.00)
Glucose, Bld: 161 mg/dL — ABNORMAL HIGH (ref 70–99)
Potassium: 3.1 mEq/L — ABNORMAL LOW (ref 3.5–5.1)
Sodium: 140 mEq/L (ref 135–145)

## 2020-07-19 LAB — HEMOGLOBIN A1C: Hgb A1c MFr Bld: 5.9 % (ref 4.6–6.5)

## 2020-07-19 NOTE — Progress Notes (Signed)
Patient ID: Whitney [name], female   DOB: 1927/07/15, 87 y.o.   MRN: [id number]   Reason for Appointment: Diabetes and other problems  History of Present Illness     Type 2 DIABETES MELITUS, date of diagnosis:  1983     Previous history: She had been taking metformin and Actos for several years.  She usually has had excellent control with upper normal A1c   She was previously on  Metformin and Actos; because of edema she was told to stop Actos Her metformin was stopped because of renal dysfunction and Amaryl because of hypoglycemia  Recent history:   Oral hypoglycemic drugs: Januvia 100 mg, half tablet daily  Her A1c is again in the normal range at 5.9  Current blood sugar patterns, management details:  She is here for her 60-monthfollow-up  She has been hospitalized recently and appears to have lost weight  Her blood sugars overall are on an average about the same as last time  As before she only checks blood sugars in the evenings between about 4-6 PM usually and this is probably after meals  Overall she still tries to watch her diet  Again she is not ready to do any physical activity for exercise Taking Januvia regularly       Monitors blood glucose:   less than once a day .    Glucometer: One Touch.           Blood sugars reviewed from monitor download  AVERAGE 160 for the last month, range 126-193  PREVIOUS range 125-202 with MEDIAN 160          Wt Readings from Last 3 Encounters:  07/19/20 159 lb 6.4 oz (72.3 kg)  07/06/20 156 lb 1.4 oz (70.8 kg)  05/20/20 156 lb (70.8 kg)    LABS:  Lab Results  Component Value Date   HGBA1C 5.9 07/19/2020   HGBA1C 5.8 (A) 01/19/2020   HGBA1C 6.1 (A) 07/19/2019   Lab Results  Component Value Date   MICROALBUR 6.1 04/18/2018   LDLCALC 53 07/19/2019   CREATININE 1.64 (H) 07/19/2020     OTHER problems  are discussed in review of systems     Allergies as of 07/19/2020      Reactions   Tape Other  (See Comments)   Skin is somewhat sensitive      Medication List       Accurate as of Jul 19, 2020 11:59 PM. If you have any questions, ask your nurse or doctor.        amiodarone 200 MG tablet Commonly known as: PACERONE Take 1 tablet (200 mg total) by mouth daily.   atorvastatin 10 MG tablet Commonly known as: LIPITOR Take 1 tablet (10 mg total) by mouth daily.   cholecalciferol 25 MCG (1000 UNIT) tablet Commonly known as: VITAMIN D3 TAKE 1 TAB BY MOUTH EVERY DAY What changed: See the new instructions.   docusate sodium 100 MG capsule Commonly known as: COLACE Take 100 mg by mouth daily.   doxazosin 4 MG tablet Commonly known as: CARDURA Take 0.5 tablets (2 mg total) by mouth daily.   ferrous sulfate 324 (65 Fe) MG Tbec TAKE 1 TABLET BY MOUTH IN THE MORNING AND 1 AT BEDTIME What changed: See the new instructions.   furosemide 40 MG tablet Commonly known as: LASIX Take 2 tablets (80 mg total) by mouth daily. What changed: when to take this   Januvia 100 MG tablet Generic drug: sitaGLIPtin TAKE  0.5 TAB BY MOUTH DAILY What changed: See the new instructions.   Klor-Con M20 20 MEQ tablet Generic drug: potassium chloride SA TAKE 1 TABLET BY MOUTH EVERY DAY What changed: how much to take   meclizine 12.5 MG tablet Commonly known as: ANTIVERT TAKE 1 TABLET BY MOUTH DAILY AS NEEDED FOR DIZZINESS What changed: See the new instructions.   metoprolol succinate 100 MG 24 hr tablet Commonly known as: TOPROL-XL TAKE 1 TABLET BY MOUTH TWICE A DAY WITH OR IMMEDIATELY FOLLOWING A MEAL What changed: See the new instructions.   OneTouch Verio test strip Generic drug: glucose blood CHECK BLOOD SUGAR EVERY DAY AS DIRECTED   OneTouch Verio w/Device Kit Use to test blood sugars daily.   polyethylene glycol 17 g packet Commonly known as: MIRALAX / GLYCOLAX Take 17 g by mouth daily. Also available OTC What changed:   when to take this  reasons to take  this  additional instructions   tamoxifen 20 MG tablet Commonly known as: NOLVADEX TAKE 1 TABLET BY MOUTH EVERY DAY       Allergies:  Allergies  Allergen Reactions  . Tape Other (See Comments)    Skin is somewhat sensitive    Past Medical History:  Diagnosis Date  . Anemia   . Atrial fibrillation ([place])   . Breast cancer ([place]) 10/2013   right upper outer  . Cancer ([place])    right breast  . Complication of anesthesia    slow to wake up  . Diabetes mellitus without complication ([place])    type 2  . Dysrhythmia 10/15   AF  . Former smoker   . Full dentures   . Hyperlipidemia   . Hypertension   . Multinodular goiter   . Osteoporosis   . Radiation    Right Breast  . Thyroid disease    hypothyroidism  . Wears glasses     Past Surgical History:  Procedure Laterality Date  . ABDOMINAL HYSTERECTOMY    . AXILLARY LYMPH NODE DISSECTION Right 01/09/2014   Procedure: RIGHT AXILLARY LYMPH NODE DISECTION;  Surgeon: [name], MD;  Location: [place];  Service: General;  Laterality: Right;  . BIOPSY  10/26/2019   Procedure: BIOPSY;  Surgeon: Rush Landmark Telford Nab., MD;  Location: [facility];  Service: Gastroenterology;;  . BIOPSY  03/11/2020   Procedure: BIOPSY;  Surgeon: Irving Copas., MD;  Location: [place];  Service: Gastroenterology;;  . BREAST LUMPECTOMY WITH RADIOACTIVE SEED LOCALIZATION Right 01/09/2014   Procedure: RIGHT BREAST SEED LOCALIZED LUMPECTOMY ;  Surgeon: [name], MD;  Location: [place];  Service: General;  Laterality: Right;  . COLONOSCOPY WITH PROPOFOL N/A 10/26/2019   Procedure: COLONOSCOPY WITH PROPOFOL;  Surgeon: Rush Landmark Telford Nab., MD;  Location: [facility];  Service: Gastroenterology;  Laterality: N/A;  . COLONOSCOPY WITH PROPOFOL N/A 03/11/2020   Procedure: COLONOSCOPY WITH PROPOFOL- Ultraslim scope;  Surgeon: Irving Copas., MD;  Location: [place];  Service:  Gastroenterology;  Laterality: N/A;  . ENDOSCOPIC MUCOSAL RESECTION N/A 03/11/2020   Procedure: ENDOSCOPIC MUCOSAL RESECTION;  Surgeon: Rush Landmark Telford Nab., MD;  Location: [place];  Service: Gastroenterology;  Laterality: N/A;  . ESOPHAGOGASTRODUODENOSCOPY (EGD) WITH PROPOFOL N/A 10/26/2019   Procedure: ESOPHAGOGASTRODUODENOSCOPY (EGD) WITH PROPOFOL;  Surgeon: Rush Landmark Telford Nab., MD;  [facility];  Service: Gastroenterology;  Laterality: N/A;  . EYE SURGERY  12/22/2009   cataracts  . HEMOSTASIS CLIP PLACEMENT  10/26/2019   Procedure: HEMOSTASIS CLIP PLACEMENT;  Surgeon: Irving Copas.,  MD;  [facility];  Service: Gastroenterology;;  marking for transverse lesion  . INTRAMEDULLARY (IM) NAIL INTERTROCHANTERIC Left 02/24/2014   Procedure: IM ROD LEFT HIP FX;  Surgeon: Alta Corning, MD;  Location: [place];  Service: Orthopedics;  Laterality: Left;  . left distal radius fracture     2020  . POLYPECTOMY  10/26/2019   Procedure: POLYPECTOMY;  Surgeon: Mansouraty, Telford Nab., MD;  Location: [facility];  Service: Gastroenterology;;  . POLYPECTOMY  03/11/2020   Procedure: POLYPECTOMY;  Surgeon: Irving Copas., MD;  Location: [place];  Service: Gastroenterology;;  . PORT-A-CATH REMOVAL Right 01/09/2014   Procedure: REMOVAL PORT-A-CATH;  Surgeon: [name], MD;  Location: [place];  Service: General;  Laterality: Right;  . PORTACATH PLACEMENT N/A 11/15/2013   Procedure: INSERTION PORT-A-CATH WITH ULTRA SOUND AND FLOROSCOPY;  Surgeon: [name], MD;  Location: [place];  Service: General;  Laterality: N/A;  . SUBMUCOSAL TATTOO INJECTION  10/26/2019   Procedure: SUBMUCOSAL TATTOO INJECTION;  Surgeon: Irving Copas., MD;  [facility];  Service: Gastroenterology;;  . TOTAL KNEE ARTHROPLASTY  2002   left    Family History  Problem Relation Age of Onset  . Colon cancer Neg Hx   . Esophageal cancer Neg Hx    . Inflammatory bowel disease Neg Hx   . Liver disease Neg Hx   . Pancreatic cancer Neg Hx   . Rectal cancer Neg Hx   . Stomach cancer Neg Hx     Social History:  reports that she quit smoking about 61 years ago. She has a 5.00 pack-year smoking history. She has never used smokeless tobacco. She reports that she does not drink alcohol and does not use drugs.  Review of Systems:   Hypertension:  She is taking currently 2 mg doxazosin, 100 mg of metoprolol Appears to be off lisinopril Followed by PCP   EDEMA of the legs, chronic, using elastic stockings with only fair control  She is taking Lasix 80 mg daily for edema, prescribed by PCP   Renal function: Creatinine has been variable, last reading was improved  Lab Results  Component Value Date   CREATININE 1.64 (H) 07/19/2020   CREATININE 1.64 (H) 07/12/2020   CREATININE 1.61 (H) 07/06/2020     Lipids: Has been on long-term Lipitor with good control, needs follow-up labs    Lab Results  Component Value Date   CHOL 110 07/19/2019   HDL 40.10 07/19/2019   LDLCALC 53 07/19/2019   TRIG 82.0 07/19/2019   CHOLHDL 3 07/19/2019    She has had vitamin D deficiency, Treated with 2000 units vitamin D 3 Needs follow-up level  OSTEOPOROSIS: Her bone density checked by her PCP indicated osteoporosis and she has had a wrist fracture Has been on Prolia PTH has been appropriate for renal function  GOITER: She has had a multinodular goiter for several years  Levothyroxine supplements were stopped in 5/16 and she has continued to be euthyroid  Her atrial arrhythmias have been treated with 200 mg of amiodarone long-term   Lab Results  Component Value Date   TSH 1.917 07/04/2020   TSH 1.27 01/19/2020   TSH 0.74 07/19/2019   FREET4 1.44 01/19/2020   FREET4 1.58 10/28/2018   FREET4 1.60 06/27/2018    Last foot exam in 8/19, patient has elastic stockings on each visit when seen   ADRENAL mass: Copy of the previous note  as follows:  She had a CT scan done previously  and was found to have a left adrenal mass  She was evaluated previously by a PET scan after  diagnosis of breast cancer in 2015 She was found to have a 3.7 cm hypermetabolic left adrenal mass On a previous CT scan in 2011 she also had a 3.8 cm adrenal mass Cortisol level is normal in 2015 and 2016 No history of hypokalemia or severe hypertension/palpitations, headaches, flushing or significant fluctuations in blood pressure Had evaluation with metanephrines and overnight dexamethasone test indicating normal adrenal function  History of anemia, has not followed up with PCP:  Lab Results  Component Value Date   HGB 9.9 (L) 07/12/2020    Examination:   BP 112/82   Pulse 92   Wt 159 lb 6.4 oz (72.3 kg)   LMP  (LMP Unknown)   SpO2 99%   BMI 23.54 kg/m   Body mass index is 23.54 kg/m.   She has some edema of her lower legs with pitting, more on the left  ASSESSMENT/ PLAN:  Diabetes type 2   See history of present illness for  discussion of current diabetes management, blood sugar patterns and problems identified  Her A1c is still excellent at 5.9  Has consistent control with Januvia 50 mg  A1c may be affected by renal dysfunction and anemia  Blood sugars are excellent overall, mostly be checked in the evenings after meals  Glucose even after eating cereal this morning is only 129  Again considering her age her level of control is excellent Has lost weight from recent intercurrent illness  Renal dysfunction: This has been recently somewhat worse Labs will be checked today   Multinodular GOITER with normal thyroid levels, eye exam has been stable also, to check TSH annually  Recent issues with hypokalemia: We will need to recheck  Follow-up in 6 months on the same dose of Januvia  Renal dysfunction: Labs will be checked today   Atorvastatin therapy: Needs follow-up lipids  Blood pressure well controlled, currently  not on lisinopril   Whitney [name] 07/20/2020, 3:11 PM    Note: This office note was prepared with Dragon voice recognition system technology. Any transcriptional errors that result from this process are unintentional.   Addendum: Potassium 3.1, will forward results to PCP

## 2020-07-20 NOTE — Progress Notes (Signed)
Please call to let patient's granddaughter know that potassium is low, Dr. Dennard Schaumann will adjust her medications with extra potassium and possibly reducing Lasix.  Kidney function still higher than 6 weeks ago

## 2020-07-22 NOTE — Progress Notes (Signed)
Have patient increase kdur to40 meq a day (2 tabs) potassium is low 3.1.

## 2020-07-24 ENCOUNTER — Telehealth: Payer: Self-pay | Admitting: *Deleted

## 2020-07-24 ENCOUNTER — Other Ambulatory Visit: Payer: Self-pay | Admitting: Family Medicine

## 2020-07-24 MED ORDER — POTASSIUM CHLORIDE CRYS ER 20 MEQ PO TBCR: 40.0000 meq | EXTENDED_RELEASE_TABLET | Freq: Every day | ORAL | 1 refills | Status: AC

## 2020-07-24 NOTE — Telephone Encounter (Signed)
Call placed to patient and patient granddaughter  [place] made aware.

## 2020-07-24 NOTE — Telephone Encounter (Addendum)
-----   Message from [name], MD sent at 07/22/2020  6:32 AM EDT ----- Have patient increase kdur to40 meq a day (2 tabs) potassium is low 3.1  ----- Message ----- From: Elayne Snare, MD Sent: 07/21/2020   1:27 PM EDT To: [name], MD

## 2020-07-25 DIAGNOSIS — I509 Heart failure, unspecified: Secondary | ICD-10-CM | POA: Diagnosis not present

## 2020-07-25 DIAGNOSIS — M810 Age-related osteoporosis without current pathological fracture: Secondary | ICD-10-CM | POA: Diagnosis not present

## 2020-07-25 DIAGNOSIS — D649 Anemia, unspecified: Secondary | ICD-10-CM | POA: Diagnosis not present

## 2020-07-29 ENCOUNTER — Encounter: Payer: Self-pay | Admitting: Pulmonary Disease

## 2020-07-29 ENCOUNTER — Ambulatory Visit: Payer: Medicare Other

## 2020-07-29 ENCOUNTER — Other Ambulatory Visit: Payer: Self-pay

## 2020-07-29 ENCOUNTER — Ambulatory Visit: Payer: Medicare Other | Admitting: Pulmonary Disease

## 2020-07-29 VITALS — BP 116/66 | HR 87 | Temp 97.2°F | Ht 68.0 in | Wt 158.8 lb

## 2020-07-29 DIAGNOSIS — J90 Pleural effusion, not elsewhere classified: Secondary | ICD-10-CM

## 2020-07-29 NOTE — Addendum Note (Signed)
Addended by: Elton Sin on: 07/29/2020 10:59 AM   Modules accepted: Orders

## 2020-07-29 NOTE — Patient Instructions (Signed)
I am glad you are doing well Will continue monitoring your lungs Follow-up in 6 months.  Will order chest x-ray to be done on day of return visit

## 2020-07-29 NOTE — Progress Notes (Signed)
Whitney [name]    [id number]    June 19, 1927  Primary Care Physician:Pickard, Cammie Mcgee, MD  Referring Physician: [name], MD [address] [place],  [place] [address]  Chief complaint: Follow-up for right pleural effusion  HPI: 87 year old with history of breast cancer, hypertension, diabetes, atrial fibrillation, she had a recent colonoscopy by [name] on 03/11/2020 with findings of high-grade tubular adenoma.  Patient did not want to proceed with surveillance colonoscopy Follow-up CT chest revealed a large right pleural effusion she has been referred to [facility] for further evaluation  History notable for right breast cancer with in 2015 which was treated with lumpectomy, chemo, radiation.  She is currently on tamoxifen under the care of Dr. Lindi Adie.  She feels well with no complaints of cough, dyspnea, hemoptysis, loss of weight, loss of appetite  Pets: No pets Occupation: Retired Systems analyst Exposures: No exposures.  No mold, hot tub, Jacuzzi.  No feather pillows or comforters Smoking history: Quit smoking in the 1950s Travel history: No significant travel history Relevant family history: No family history of lung disease  Interim history: Underwent thoracentesis in March 2022 States that breathing is doing well with no issues.  On room air  She had recent admission in April for hematochezia.  Chest x-ray at that time showed a persistent right lower lobe consolidation with small effusion  Outpatient Encounter Medications as of 07/29/2020  Medication Sig  . amiodarone (PACERONE) 200 MG tablet Take 1 tablet (200 mg total) by mouth daily.  Marland Kitchen atorvastatin (LIPITOR) 10 MG tablet Take 1 tablet (10 mg total) by mouth daily.  . Blood Glucose Monitoring Suppl (ONETOUCH VERIO) w/Device KIT Use to test blood sugars daily.  . cholecalciferol (VITAMIN D3) 25 MCG (1000 UNIT) tablet TAKE 1 TAB BY MOUTH EVERY DAY (Patient taking differently: Take 1,000  Units by mouth daily.)  . docusate sodium (COLACE) 100 MG capsule Take 100 mg by mouth daily.  Marland Kitchen doxazosin (CARDURA) 4 MG tablet Take 0.5 tablets (2 mg total) by mouth daily.  . ferrous sulfate 324 (65 Fe) MG TBEC TAKE 1 TABLET BY MOUTH IN THE MORNING AND 1 AT BEDTIME (Patient taking differently: Take 324 mg by mouth 2 (two) times daily.)  . furosemide (LASIX) 40 MG tablet TAKE 2 TABLETS BY MOUTH EVERY DAY  . JANUVIA 100 MG tablet TAKE 0.5 TAB BY MOUTH DAILY (Patient taking differently: Take 50 mg by mouth daily.)  . meclizine (ANTIVERT) 12.5 MG tablet TAKE 1 TABLET BY MOUTH DAILY AS NEEDED FOR DIZZINESS (Patient taking differently: Take 12.5 mg by mouth as needed for dizziness.)  . metoprolol succinate (TOPROL-XL) 100 MG 24 hr tablet TAKE 1 TABLET BY MOUTH TWICE A DAY WITH OR IMMEDIATELY FOLLOWING A MEAL (Patient taking differently: Take 100 mg by mouth 2 (two) times daily. WITH OR IMMEDIATELY FOLLOWING A MEAL)  . ONETOUCH VERIO test strip CHECK BLOOD SUGAR EVERY DAY AS DIRECTED  . polyethylene glycol (MIRALAX / GLYCOLAX) 17 g packet Take 17 g by mouth daily. Also available OTC (Patient taking differently: Take 17 g by mouth as needed for mild constipation.)  . potassium chloride SA (KLOR-CON M20) 20 MEQ tablet Take 2 tablets (40 mEq total) by mouth daily.  . tamoxifen (NOLVADEX) 20 MG tablet TAKE 1 TABLET BY MOUTH EVERY DAY (Patient taking differently: Take 20 mg by mouth daily.)   Facility-Administered Encounter Medications as of 07/29/2020  Medication  . denosumab (PROLIA) injection 60  mg    Physical Exam: Blood pressure 116/66, pulse 87, temperature (!) 97.2 F (36.2 C), temperature source Temporal, height _0  (1.727 m), weight 158 lb 12.8 oz (72 kg), SpO2 98 %. Gen:      No acute distress, frail, elderly HEENT:  EOMI, sclera anicteric Neck:     No masses; no thyromegaly Lungs:    Diminished breath sounds on the right base CV:         Regular rate and rhythm; no murmurs Abd:      +  bowel sounds; soft, non-tender; no palpable masses, no distension Ext:    No edema; adequate peripheral perfusion Skin:      Warm and dry; no rash Neuro: alert and oriented x 3 Psych: normal mood and affect  Data Reviewed: Imaging: CT abdomen pelvis 01/01/2016-trace right effusion  CT abdomen pelvis 01/12/2020-moderate right effusion with compressive atelectasis  CT abdomen pelvis 04/29/2020-large right effusion with collapse/consolidation of the right lower and right middle lobe  Chest x-ray 07/04/2020-persistent consolidation in the right lower lobe with small effusion I have reviewed the images personally  PFTs:  Labs: SARS-CoV-2 test 10/24/2019, 01/13/2020, 03/07/2020-negative  Thoracentesis 05/20/2020 Cell count 121, 81% lymphs Protein less than 3, LDH 55 Cultures and cytology-negative  Assessment:  Right effusion This has been present at least since November 2021 but it was noted on CT abdomen pelvis.  She does not appear infected with no clinical evidence of pneumonia.    Underwent thoracentesis in March 2022 with findings consistent with transudative.  Suspect effusion secondary to CHF though it is unusual to have right effusion Since she is asymptomatic and given her advanced age we will continue to observe, recommend conservative management for now  Plan/Recommendations: Follow-up in 6 months with chest x-ray  Marshell Garfinkel MD [facility] 07/29/2020, 10:44 AM  CC: [name], MD

## 2020-07-31 ENCOUNTER — Emergency Department (HOSPITAL_COMMUNITY)
Admission: EM | Admit: 2020-07-31 | Discharge: 2020-07-31 | Disposition: A | Discharge status: Home / Self Care | Payer: Medicare Other | Attending: Emergency Medicine | Admitting: Emergency Medicine

## 2020-07-31 ENCOUNTER — Other Ambulatory Visit: Payer: Self-pay

## 2020-07-31 ENCOUNTER — Encounter (HOSPITAL_COMMUNITY): Payer: Self-pay | Admitting: *Deleted

## 2020-07-31 DIAGNOSIS — Z853 Personal history of malignant neoplasm of breast: Secondary | ICD-10-CM | POA: Insufficient documentation

## 2020-07-31 DIAGNOSIS — E1122 Type 2 diabetes mellitus with diabetic chronic kidney disease: Secondary | ICD-10-CM | POA: Insufficient documentation

## 2020-07-31 DIAGNOSIS — R609 Edema, unspecified: Secondary | ICD-10-CM | POA: Insufficient documentation

## 2020-07-31 DIAGNOSIS — Z79899 Other long term (current) drug therapy: Secondary | ICD-10-CM | POA: Diagnosis not present

## 2020-07-31 DIAGNOSIS — Z96652 Presence of left artificial knee joint: Secondary | ICD-10-CM | POA: Insufficient documentation

## 2020-07-31 DIAGNOSIS — N1832 Chronic kidney disease, stage 3b: Secondary | ICD-10-CM | POA: Insufficient documentation

## 2020-07-31 DIAGNOSIS — I5032 Chronic diastolic (congestive) heart failure: Secondary | ICD-10-CM | POA: Insufficient documentation

## 2020-07-31 DIAGNOSIS — M7989 Other specified soft tissue disorders: Secondary | ICD-10-CM | POA: Diagnosis present

## 2020-07-31 DIAGNOSIS — I130 Hypertensive heart and chronic kidney disease with heart failure and stage 1 through stage 4 chronic kidney disease, or unspecified chronic kidney disease: Secondary | ICD-10-CM | POA: Insufficient documentation

## 2020-07-31 DIAGNOSIS — E039 Hypothyroidism, unspecified: Secondary | ICD-10-CM | POA: Diagnosis not present

## 2020-07-31 DIAGNOSIS — Z87891 Personal history of nicotine dependence: Secondary | ICD-10-CM | POA: Insufficient documentation

## 2020-07-31 DIAGNOSIS — R600 Localized edema: Secondary | ICD-10-CM

## 2020-07-31 LAB — BASIC METABOLIC PANEL
Anion gap: 10 (ref 5–15)
BUN: 33 mg/dL — ABNORMAL HIGH (ref 8–23)
CO2: 24 mmol/L (ref 22–32)
Calcium: 8.5 mg/dL — ABNORMAL LOW (ref 8.9–10.3)
Chloride: 105 mmol/L (ref 98–111)
Creatinine, Ser: 1.87 mg/dL — ABNORMAL HIGH (ref 0.44–1.00)
GFR, Estimated: 25 mL/min — ABNORMAL LOW (ref >60)
Glucose, Bld: 142 mg/dL — ABNORMAL HIGH (ref 70–99)
Potassium: 4.1 mmol/L (ref 3.5–5.1)
Sodium: 139 mmol/L (ref 135–145)

## 2020-07-31 LAB — CBC WITH DIFFERENTIAL/PLATELET
Abs Immature Granulocytes: 0 10*3/uL (ref 0.00–0.07)
Basophils Absolute: 0 10*3/uL (ref 0.0–0.1)
Basophils Relative: 0 %
Eosinophils Absolute: 0 10*3/uL (ref 0.0–0.5)
Eosinophils Relative: 0 %
HCT: 29 % — ABNORMAL LOW (ref 36.0–46.0)
Hemoglobin: 9.3 g/dL — ABNORMAL LOW (ref 12.0–15.0)
Lymphocytes Relative: 4 %
Lymphs Abs: 0.2 10*3/uL — ABNORMAL LOW (ref 0.7–4.0)
MCH: 35.4 pg — ABNORMAL HIGH (ref 26.0–34.0)
MCHC: 32.1 g/dL (ref 30.0–36.0)
MCV: 110.3 fL — ABNORMAL HIGH (ref 80.0–100.0)
Monocytes Absolute: 0.2 10*3/uL (ref 0.1–1.0)
Monocytes Relative: 4 %
Neutro Abs: 3.7 10*3/uL (ref 1.7–7.7)
Neutrophils Relative %: 92 %
Platelets: 166 10*3/uL (ref 150–400)
RBC: 2.63 MIL/uL — ABNORMAL LOW (ref 3.87–5.11)
RDW: 14.8 % (ref 11.5–15.5)
WBC: 4 10*3/uL (ref 4.0–10.5)
nRBC: 0 % (ref 0.0–0.2)
nRBC: 0 /100 WBC

## 2020-07-31 LAB — BRAIN NATRIURETIC PEPTIDE: B Natriuretic Peptide: 721.6 pg/mL — ABNORMAL HIGH (ref 0.0–100.0)

## 2020-07-31 NOTE — ED Triage Notes (Signed)
Pt and family member reports swelling to patients legs, began having drainage to right lower leg last night. No acute distress is noted at triage. Denies sob.

## 2020-07-31 NOTE — ED Provider Notes (Signed)
Emergency Medicine Provider Triage Evaluation Note  Whitney [name] , a 87 y.o. female  was evaluated in triage.  Pt complains of fluid leaving from right lower leg, onset last night, history of afib, takes fluid pills.  Review of Systems  Positive: Leg swelling Negative: shob  Physical Exam  BP 109/76 (BP Location: Left Arm)   Pulse 95   Temp 98.6 F (37 C) (Oral)   Resp 15   LMP  (LMP Unknown)   SpO2 98%  Gen:   Awake, no distress   Resp:  Normal effort  MSK:   Moves extremities without difficulty  Other:  Edema to lower legs, small wound to lateral right lower leg with clear drainage  Medical Decision Making  Medically screening exam initiated at 5:49 PM.  Appropriate orders placed.  Whitney [name] was informed that the remainder of the evaluation will be completed by another provider, this initial triage assessment does not replace that evaluation, and the importance of remaining in the ED until their evaluation is complete.     Tacy Learn, PA-C 07/31/20 1751    Arnaldo Natal, MD 07/31/20 [phone]

## 2020-07-31 NOTE — Discharge Instructions (Addendum)
Please keep her skin clean and dry. Be sure you wear your compression stockings, elevate your legs frequently. Follow closely with your  primary care provider. Return if you  develop shortness of breath, redness or signs of infection to your leg, or other concerning symptoms.

## 2020-07-31 NOTE — ED Provider Notes (Signed)
[facility] EMERGENCY DEPARTMENT Provider Note   CSN: [id number] Arrival date & time: 07/31/20  1727     History Chief Complaint  Patient presents with  . Leg Swelling    HALONA AMSTUTZ is a 87 y.o. female past medical history of DHF, breast cancer, type 2 diabetes, CKD stage III, presenting for evaluation of right lower leg.  Family reports her right leg began weeping fluid last night.  She has chronic bilateral lower extremity edema that is unchanged.  She is not having any fevers, redness, warmth, shortness of breath or chest pain.  She feels at her baseline and well.  Patient's family states she has never had fluid leak from the leg, it is leaking from a small spot to her right lower leg.  Family bandaged the area.  Per review of medical record, patient was recently admitted in April where she underwent thoracentesis for right-sided pleural effusion.  She is followed by Dr. Vaughan Browner pulmonology, last visit was 2 days ago.  The history is provided by the patient, medical records and a relative.       Past Medical History:  Diagnosis Date  . Anemia   . Atrial fibrillation ([place])   . Breast cancer ([place]) 10/2013   right upper outer  . Cancer ([place])    right breast  . Complication of anesthesia    slow to wake up  . Diabetes mellitus without complication ([place])    type 2  . Dysrhythmia 10/15   AF  . Former smoker   . Full dentures   . Hyperlipidemia   . Hypertension   . Multinodular goiter   . Osteoporosis   . Radiation    Right Breast  . Thyroid disease    hypothyroidism  . Wears glasses     Patient Active Problem List   Diagnosis Date Noted  . Rectal bleed 07/05/2020  . Rectal bleeding 07/04/2020  . Prolonged QT interval 07/04/2020  . History of breast cancer 07/04/2020  . Chronic colitis 05/21/2020  . Tubular adenoma with multifocal HGD and foci 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 ([place]) 05/21/2020  . History of  colon polyps 12/01/2019  . Colitis, acute 12/01/2019  . Diverticulosis 12/01/2019  . Stenosis colon ([place]) 12/01/2019  . Abnormal colonoscopy 12/01/2019  . Macrocytic anemia 10/25/2019  . Bright red blood per rectum 10/24/2019  . Stage 3b chronic kidney disease ([place]) 10/24/2019  . Coagulopathy ([place])   . Osteoporosis   . Unilateral primary osteoarthritis, right knee 03/25/2018  . Chronic pain of right knee 05/25/2017  . Bacteremia 10/25/2016  . Transaminitis 10/23/2016  . Sinus bradycardia 10/23/2016  . Controlled diabetes mellitus type 2 with complications ([place])   . Paroxysmal atrial fibrillation ([facility])   . Chronic diastolic CHF (congestive heart failure) ([place])   . Pulmonary hypertension ([place])   . Acute renal failure superimposed on stage 2 chronic kidney disease ([place])   . Elevated liver enzymes   . Bacteremia due to Escherichia coli 12/30/2015  . UTI (urinary tract infection) 12/30/2015  . Acute blood loss anemia 02/25/2014  . Closed left hip fracture ([place]) 02/24/2014  . PAF (paroxysmal atrial fibrillation) ([place]) 01/30/2014  . Chronic anticoagulation 01/30/2014  . Chronic diastolic CHF (congestive heart failure), NYHA class 2 ([place]) 12/10/2013  . Preoperative cardiovascular examination 12/10/2013  . Neutropenic fever ([place]) 12/08/2013  . Anemia of chronic disease 12/08/2013  . Thrombocytopenia ([place]) 12/08/2013  . Hypokalemia 12/08/2013  . Pancytopenia due to antineoplastic chemotherapy ([place])  12/08/2013  . Hypothyroidism 12/08/2013  . Breast cancer of upper-outer quadrant of right female breast ([place]) 11/01/2013  . Essential hypertension, benign 12/01/2012  . Pure hypercholesterolemia 12/01/2012  . DM2 (diabetes mellitus, type 2) ([place]) 11/21/2012    Past Surgical History:  Procedure Laterality Date  . ABDOMINAL HYSTERECTOMY    . AXILLARY LYMPH NODE DISSECTION Right 01/09/2014   Procedure: RIGHT AXILLARY LYMPH NODE DISECTION;  Surgeon: [name], MD;  Location: [facility];  Service: General;  Laterality: Right;  . BIOPSY  10/26/2019   Procedure: BIOPSY;  Surgeon: Rush Landmark Telford Nab., MD;  Location: [facility];  Service: Gastroenterology;;  . BIOPSY  03/11/2020   Procedure: BIOPSY;  Surgeon: Irving Copas., MD;  Location: [facility];  Service: Gastroenterology;;  . BREAST LUMPECTOMY WITH RADIOACTIVE SEED LOCALIZATION Right 01/09/2014   Procedure: RIGHT BREAST SEED LOCALIZED LUMPECTOMY ;  Surgeon: [name], MD;  Location: [facility];  Service: General;  Laterality: Right;  . COLONOSCOPY WITH PROPOFOL N/A 10/26/2019   Procedure: COLONOSCOPY WITH PROPOFOL;  Surgeon: Rush Landmark Telford Nab., MD;  Location: [facility];  Service: Gastroenterology;  Laterality: N/A;  . COLONOSCOPY WITH PROPOFOL N/A 03/11/2020   Procedure: COLONOSCOPY WITH PROPOFOL- Ultraslim scope;  Surgeon: Irving Copas., MD;  Location: [facility];  Service: Gastroenterology;  Laterality: N/A;  . ENDOSCOPIC MUCOSAL RESECTION N/A 03/11/2020   Procedure: ENDOSCOPIC MUCOSAL RESECTION;  Surgeon: Rush Landmark Telford Nab., MD;  Location: [facility];  Service: Gastroenterology;  Laterality: N/A;  . ESOPHAGOGASTRODUODENOSCOPY (EGD) WITH PROPOFOL N/A 10/26/2019   Procedure: ESOPHAGOGASTRODUODENOSCOPY (EGD) WITH PROPOFOL;  Surgeon: Rush Landmark Telford Nab., MD;  [facility];  Service: Gastroenterology;  Laterality: N/A;  . EYE SURGERY  12/22/2009   cataracts  . HEMOSTASIS CLIP PLACEMENT  10/26/2019   Procedure: HEMOSTASIS CLIP PLACEMENT;  Surgeon: Irving Copas., MD;  Location: [facility];  Service: Gastroenterology;;  marking for transverse lesion  . INTRAMEDULLARY (IM) NAIL INTERTROCHANTERIC Left 02/24/2014   Procedure: IM ROD LEFT HIP FX;  Surgeon: Alta Corning, MD;  Location: [facility];  Service: Orthopedics;  Laterality: Left;  . left distal radius fracture     2020  . POLYPECTOMY  10/26/2019   Procedure: POLYPECTOMY;   Surgeon: Mansouraty, Telford Nab., MD;  Location: [facility];  Service: Gastroenterology;;  . POLYPECTOMY  03/11/2020   Procedure: POLYPECTOMY;  Surgeon: Irving Copas., MD;  Location: [facility];  Service: Gastroenterology;;  . PORT-A-CATH REMOVAL Right 01/09/2014   Procedure: REMOVAL PORT-A-CATH;  Surgeon: [name], MD;  Location: [facility];  Service: General;  Laterality: Right;  . PORTACATH PLACEMENT N/A 11/15/2013   Procedure: INSERTION PORT-A-CATH WITH ULTRA SOUND AND FLOROSCOPY;  Surgeon: [name], MD;  Location: [facility];  Service: General;  Laterality: N/A;  . SUBMUCOSAL TATTOO INJECTION  10/26/2019   Procedure: SUBMUCOSAL TATTOO INJECTION;  Surgeon: Irving Copas., MD;  Location[facility];  Service: Gastroenterology;;  . TOTAL KNEE ARTHROPLASTY  2002   left     OB History   No obstetric history on file.     Family History  Problem Relation Age of Onset  . Colon cancer Neg Hx   . Esophageal cancer Neg Hx   . Inflammatory bowel disease Neg Hx   . Liver disease Neg Hx   . Pancreatic cancer Neg Hx   . Rectal cancer Neg Hx   . Stomach cancer Neg Hx     Social History   Tobacco Use  . Smoking status: Former  Smoker    Packs/day: 1.00    Years: 5.00    Pack years: 5.00    Quit date: 11/14/1958    Years since quitting: 61.7  . Smokeless tobacco: Never Used  Vaping Use  . Vaping Use: Never used  Substance Use Topics  . Alcohol use: No    Alcohol/week: 0.0 standard drinks  . Drug use: No    Home Medications Prior to Admission medications   Medication Sig Start Date End Date Taking? Authorizing Provider  amiodarone (PACERONE) 200 MG tablet Take 1 tablet (200 mg total) by mouth daily. 02/05/20   [name], MD  atorvastatin (LIPITOR) 10 MG tablet Take 1 tablet (10 mg total) by mouth daily. 05/03/20   [name], MD  Blood Glucose Monitoring Suppl (ONETOUCH VERIO) w/Device KIT Use to test blood sugars daily.  11/23/17   Elayne Snare, MD  cholecalciferol (VITAMIN D3) 25 MCG (1000 UNIT) tablet TAKE 1 TAB BY MOUTH EVERY DAY Patient taking differently: Take 1,000 Units by mouth daily. 06/03/20   [name], MD  docusate sodium (COLACE) 100 MG capsule Take 100 mg by mouth daily.    [provider]  doxazosin (CARDURA) 4 MG tablet Take 0.5 tablets (2 mg total) by mouth daily. 10/30/19   Rai, Ripudeep K, MD  ferrous sulfate 324 (65 Fe) MG TBEC TAKE 1 TABLET BY MOUTH IN THE MORNING AND 1 AT BEDTIME Patient taking differently: Take 324 mg by mouth 2 (two) times daily. 05/07/20   [name], MD  furosemide (LASIX) 40 MG tablet TAKE 2 TABLETS BY MOUTH EVERY DAY 07/24/20   [name], MD  JANUVIA 100 MG tablet TAKE 0.5 TAB BY MOUTH DAILY Patient taking differently: Take 50 mg by mouth daily. 06/03/20   [name], MD  meclizine (ANTIVERT) 12.5 MG tablet TAKE 1 TABLET BY MOUTH DAILY AS NEEDED FOR DIZZINESS Patient taking differently: Take 12.5 mg by mouth as needed for dizziness. 06/03/20   [name], MD  metoprolol succinate (TOPROL-XL) 100 MG 24 hr tablet TAKE 1 TABLET BY MOUTH TWICE A DAY WITH OR IMMEDIATELY FOLLOWING A MEAL Patient taking differently: Take 100 mg by mouth 2 (two) times daily. WITH OR IMMEDIATELY FOLLOWING A MEAL 02/05/20   Elayne Snare, MD  [facility] VERIO test strip CHECK BLOOD SUGAR EVERY DAY AS DIRECTED 02/05/20   Elayne Snare, MD  polyethylene glycol (MIRALAX / GLYCOLAX) 17 g packet Take 17 g by mouth daily. Also available OTC Patient taking differently: Take 17 g by mouth as needed for mild constipation. 10/31/19   Rai, Ripudeep K, MD  potassium chloride SA (KLOR-CON M20) 20 MEQ tablet Take 2 tablets (40 mEq total) by mouth daily. 07/24/20   [name], MD  tamoxifen (NOLVADEX) 20 MG tablet TAKE 1 TABLET BY MOUTH EVERY DAY Patient taking differently: Take 20 mg by mouth daily. 03/25/20   Nicholas Lose, MD    Allergies    Tape  Review of Systems    Review of Systems  All other systems reviewed and are negative.   Physical Exam Updated Vital Signs BP 126/82 (BP Location: Left Arm)   Pulse 89   Temp 98.1 F (36.7 C) (Oral)   Resp 20   LMP  (LMP Unknown)   SpO2 98%   Physical Exam Vitals and nursing note reviewed.  Constitutional:      General: She is not in acute distress.    Appearance: She is well-developed.  HENT:  Head: Normocephalic and atraumatic.  Eyes:     Conjunctiva/sclera: Conjunctivae normal.  Cardiovascular:     Rate and Rhythm: Normal rate.  Pulmonary:     Effort: Pulmonary effort is normal. No respiratory distress.  Abdominal:     Palpations: Abdomen is soft.  Musculoskeletal:     Comments: 3+ pitting edema bilateral lower extremities.  There is a small linear 0.5 cm superficial laceration to the right lateral lower leg.  It is weeping serous fluid.  There are no surrounding inflammatory changes.  Area is nontender.  There is no other weeping noted from the lower extremities.  Skin:    General: Skin is warm.  Neurological:     Mental Status: She is alert.  Psychiatric:        Behavior: Behavior normal.     ED Results / Procedures / Treatments   Labs (all labs ordered are listed, but only abnormal results are displayed) Labs Reviewed  BASIC METABOLIC PANEL - Abnormal; Notable for the following components:      Result Value   Glucose, Bld 142 (*)    BUN 33 (*)    Creatinine, Ser 1.87 (*)    Calcium 8.5 (*)    GFR, Estimated 25 (*)    All other components within normal limits  CBC WITH DIFFERENTIAL/PLATELET - Abnormal; Notable for the following components:   RBC 2.63 (*)    Hemoglobin 9.3 (*)    HCT 29.0 (*)    MCV 110.3 (*)    MCH 35.4 (*)    Lymphs Abs 0.2 (*)    All other components within normal limits  BRAIN NATRIURETIC PEPTIDE - Abnormal; Notable for the following components:   B Natriuretic Peptide 721.6 (*)    All other components within normal limits     EKG None  Radiology No results found.  Procedures Procedures   Medications Ordered in ED Medications - No data to display  ED Course  I have reviewed the triage vital signs and the nursing notes.  Pertinent labs & imaging results that were available during my care of the patient were reviewed by me and considered in my medical decision making (see chart for details).    MDM Rules/Calculators/A&P                          Patient is a 87 year old female with history of CHF, presenting for evaluation of a weeping tiny superficial laceration to the right lower leg.  She has chronic lower leg edema that is unchanged from baseline.  She is sure she is not having any shortness of breath whatsoever, no chest pains.  Do not believe chest x-ray is necessary at this time.  This is discussed with attending physician Dr. Ashok Cordia, who is in agreement.  Exam shows no signs of cellulitis.  Explained that the weeping is likely due to the break in the skin and the chronic edema.  Discussed importance of good wound care, keeping her skin as dry as possible, compression stockings, elevation.  Follow closely outpatient as needed.  Return if she develops shortness of breath, or signs of infection.  Discussed results, findings, treatment and follow up. Patient advised of return precautions. Patient verbalized understanding and agreed with plan.  Final Clinical Impression(s) / ED Diagnoses Final diagnoses:  Bilateral lower extremity edema    Rx / DC Orders ED Discharge Orders    None       Analiza Cowger, [place] N, PA-C 07/31/20  [id number]    Lajean Saver, MD 07/31/20 2253

## 2020-07-31 NOTE — ED Notes (Signed)
Reviewed discharge instructions with patient. Follow-up care reviewed. Patient verbalized understanding. Patient A&Ox4, VSS upon discharge.  

## 2020-08-06 ENCOUNTER — Other Ambulatory Visit: Payer: Self-pay

## 2020-08-06 ENCOUNTER — Encounter: Payer: Self-pay | Admitting: Family Medicine

## 2020-08-06 ENCOUNTER — Ambulatory Visit (INDEPENDENT_AMBULATORY_CARE_PROVIDER_SITE_OTHER): Payer: Medicare Other | Admitting: Family Medicine

## 2020-08-06 VITALS — BP 128/66 | HR 66 | Temp 97.9°F | Resp 16

## 2020-08-06 DIAGNOSIS — I5032 Chronic diastolic (congestive) heart failure: Secondary | ICD-10-CM | POA: Diagnosis not present

## 2020-08-06 DIAGNOSIS — N1831 Chronic kidney disease, stage 3a: Secondary | ICD-10-CM

## 2020-08-06 MED ORDER — METOLAZONE 5 MG PO TABS: 5.0000 mg | ORAL_TABLET | Freq: Once | ORAL | 0 refills | Status: AC

## 2020-08-06 NOTE — Progress Notes (Signed)
Subjective:    Patient ID: Whitney [name], female    DOB: 1927-12-03, 87 y.o.   MRN: [id number]  Patient has weeping edema in both legs.  Whitney [name] went to the emergency room on May 18 for this.  BNP was mildly elevated there at 700.  Creatinine was 1.82 and up from her baseline.  They did not make any changes.  Whitney [name] is been wearing her compression hose however Whitney [name] continues to have superficial ulcers develop on both legs that are weeping clear serous fluid.  Whitney [name] denies any chest pain or shortness of breath however Whitney [name] is sedentary and spends most of her day in a chair  Past Surgical History:  Procedure Laterality Date  . ABDOMINAL HYSTERECTOMY    . AXILLARY LYMPH NODE DISSECTION Right 01/09/2014   Procedure: RIGHT AXILLARY LYMPH NODE DISECTION;  Surgeon: [name], MD;  Location: [place];  Service: General;  Laterality: Right;  . BIOPSY  10/26/2019   Procedure: BIOPSY;  Surgeon: Rush Landmark Telford Nab., MD;  Location: [facility];  Service: Gastroenterology;;  . BIOPSY  03/11/2020   Procedure: BIOPSY;  Surgeon: Irving Copas., MD;  Location: [place];  Service: Gastroenterology;;  . BREAST LUMPECTOMY WITH RADIOACTIVE SEED LOCALIZATION Right 01/09/2014   Procedure: RIGHT BREAST SEED LOCALIZED LUMPECTOMY ;  Surgeon: [name], MD;  Location: [place];  Service: General;  Laterality: Right;  . COLONOSCOPY WITH PROPOFOL N/A 10/26/2019   Procedure: COLONOSCOPY WITH PROPOFOL;  Surgeon: Rush Landmark Telford Nab., MD;  Location: [facility];  Service: Gastroenterology;  Laterality: N/A;  . COLONOSCOPY WITH PROPOFOL N/A 03/11/2020   Procedure: COLONOSCOPY WITH PROPOFOL- Ultraslim scope;  Surgeon: Irving Copas., MD;  Location: [place];  Service: Gastroenterology;  Laterality: N/A;  . ENDOSCOPIC MUCOSAL RESECTION N/A 03/11/2020   Procedure: ENDOSCOPIC MUCOSAL RESECTION;  Surgeon: Rush Landmark Telford Nab., MD;  Location: [place];   Service: Gastroenterology;  Laterality: N/A;  . ESOPHAGOGASTRODUODENOSCOPY (EGD) WITH PROPOFOL N/A 10/26/2019   Procedure: ESOPHAGOGASTRODUODENOSCOPY (EGD) WITH PROPOFOL;  Surgeon: Rush Landmark Telford Nab., MD;  Location: [facility];  Service: Gastroenterology;  Laterality: N/A;  . EYE SURGERY  12/22/2009   cataracts  . HEMOSTASIS CLIP PLACEMENT  10/26/2019   Procedure: HEMOSTASIS CLIP PLACEMENT;  Surgeon: Irving Copas., MD;  Location: [facility];  Service: Gastroenterology;;  marking for transverse lesion  . INTRAMEDULLARY (IM) NAIL INTERTROCHANTERIC Left 02/24/2014   Procedure: IM ROD LEFT HIP FX;  Surgeon: Alta Corning, MD;  Location: [place];  Service: Orthopedics;  Laterality: Left;  . left distal radius fracture     2020  . POLYPECTOMY  10/26/2019   Procedure: POLYPECTOMY;  Surgeon: Mansouraty, Telford Nab., MD;  Location: [facility];  Service: Gastroenterology;;  . POLYPECTOMY  03/11/2020   Procedure: POLYPECTOMY;  Surgeon: Irving Copas., MD;  Location: [place];  Service: Gastroenterology;;  . PORT-A-CATH REMOVAL Right 01/09/2014   Procedure: REMOVAL PORT-A-CATH;  Surgeon: [name], MD;  Location: [place];  Service: General;  Laterality: Right;  . PORTACATH PLACEMENT N/A 11/15/2013   Procedure: INSERTION PORT-A-CATH WITH ULTRA SOUND AND FLOROSCOPY;  Surgeon: [name], MD;  Location: [place];  Service: General;  Laterality: N/A;  . SUBMUCOSAL TATTOO INJECTION  10/26/2019   Procedure: SUBMUCOSAL TATTOO INJECTION;  Surgeon: Irving Copas., MD;  Location: [facility];  Service: Gastroenterology;;  . TOTAL KNEE ARTHROPLASTY  2002   left   Current Outpatient Medications on File Prior to Visit  Medication Sig Dispense Refill  .  amiodarone (PACERONE) 200 MG tablet Take 1 tablet (200 mg total) by mouth daily. 90 tablet 3  . atorvastatin (LIPITOR) 10 MG tablet Take 1 tablet (10 mg total) by mouth daily. 90 tablet 1  . Blood Glucose  Monitoring Suppl (ONETOUCH VERIO) w/Device KIT Use to test blood sugars daily. 1 kit 0  . cholecalciferol (VITAMIN D3) 25 MCG (1000 UNIT) tablet TAKE 1 TAB BY MOUTH EVERY DAY (Patient taking differently: Take 1,000 Units by mouth daily.) 100 tablet 1  . docusate sodium (COLACE) 100 MG capsule Take 100 mg by mouth daily.    Marland Kitchen doxazosin (CARDURA) 4 MG tablet Take 0.5 tablets (2 mg total) by mouth daily.    . ferrous sulfate 324 (65 Fe) MG TBEC TAKE 1 TABLET BY MOUTH IN THE MORNING AND 1 AT BEDTIME (Patient taking differently: Take 324 mg by mouth 2 (two) times daily.) 180 tablet 1  . furosemide (LASIX) 40 MG tablet TAKE 2 TABLETS BY MOUTH EVERY DAY 180 tablet 1  . JANUVIA 100 MG tablet TAKE 0.5 TAB BY MOUTH DAILY (Patient taking differently: Take 50 mg by mouth daily.) 45 tablet 3  . meclizine (ANTIVERT) 12.5 MG tablet TAKE 1 TABLET BY MOUTH DAILY AS NEEDED FOR DIZZINESS (Patient taking differently: Take 12.5 mg by mouth as needed for dizziness.) 30 tablet 2  . metoprolol succinate (TOPROL-XL) 100 MG 24 hr tablet TAKE 1 TABLET BY MOUTH TWICE A DAY WITH OR IMMEDIATELY FOLLOWING A MEAL (Patient taking differently: Take 100 mg by mouth 2 (two) times daily. WITH OR IMMEDIATELY FOLLOWING A MEAL) 180 tablet 1  . ONETOUCH VERIO test strip CHECK BLOOD SUGAR EVERY DAY AS DIRECTED 100 strip 2  . polyethylene glycol (MIRALAX / GLYCOLAX) 17 g packet Take 17 g by mouth daily. Also available OTC (Patient taking differently: Take 17 g by mouth as needed for mild constipation.) 30 each 0  . potassium chloride SA (KLOR-CON M20) 20 MEQ tablet Take 2 tablets (40 mEq total) by mouth daily. 180 tablet 1  . tamoxifen (NOLVADEX) 20 MG tablet TAKE 1 TABLET BY MOUTH EVERY DAY (Patient taking differently: Take 20 mg by mouth daily.) 90 tablet 3   Current Facility-Administered Medications on File Prior to Visit  Medication Dose Route Frequency Provider Last Rate Last Admin  . denosumab (PROLIA) injection 60 mg  60 mg  Subcutaneous Q6 months [name], MD   60 mg at 04/19/20 1135   Allergies  Allergen Reactions  . Tape Other (See Comments)    Skin is somewhat sensitive   Social History   Socioeconomic History  . Marital status: Widowed    Spouse name: Not on file  . Number of children: 4  . Years of education: Not on file  . Highest education level: Not on file  Occupational History  . Not on file  Tobacco Use  . Smoking status: Former Smoker    Packs/day: 1.00    Years: 5.00    Pack years: 5.00    Quit date: 11/14/1958    Years since quitting: 61.7  . Smokeless tobacco: Never Used  Vaping Use  . Vaping Use: Never used  Substance and Sexual Activity  . Alcohol use: No    Alcohol/week: 0.0 standard drinks  . Drug use: No  . Sexual activity: Not Currently    Birth control/protection: Post-menopausal    Comment: menarche age 23, P29, first birth age 89, no HRT, menopause age 19  Other Topics Concern  . Not on file  Social History Narrative  . Not on file   Social Determinants of Health   Financial Resource Strain: Low Risk   . Difficulty of Paying Living Expenses: Not very hard  Food Insecurity: Not on file  Transportation Needs: Not on file  Physical Activity: Not on file  Stress: Not on file  Social Connections: Not on file  Intimate Partner Violence: Not on file      Review of Systems  All other systems reviewed and are negative.      Objective:   Physical Exam Vitals reviewed.  Constitutional:      General: Whitney [name] is not in acute distress.    Appearance: Normal appearance. Whitney [name] is not ill-appearing or toxic-appearing.  HENT:     Right Ear: Tympanic membrane and ear canal normal.     Left Ear: Tympanic membrane and ear canal normal.     Nose: No congestion or rhinorrhea.     Mouth/Throat:     Pharynx: No oropharyngeal exudate or posterior oropharyngeal erythema.  Cardiovascular:     Rate and Rhythm: Normal rate. Rhythm irregular.     Heart sounds: Normal  heart sounds.  Pulmonary:     Effort: Pulmonary effort is normal. No respiratory distress.     Breath sounds: Examination of the left-lower field reveals rales. Rales present. No decreased breath sounds, wheezing or rhonchi.  Chest:     Chest wall: No tenderness.  Abdominal:     General: Abdomen is flat. Bowel sounds are normal. There is no distension.     Palpations: Abdomen is soft.     Tenderness: There is no abdominal tenderness. There is no guarding or rebound.  Musculoskeletal:     Right lower leg: Edema present.     Left lower leg: Edema present.  Lymphadenopathy:     Cervical: No cervical adenopathy.  Neurological:     Mental Status: Whitney [name] is alert.           Assessment & Plan:  Chronic diastolic CHF (congestive heart failure), NYHA class 2 (HCC)  Chronic kidney disease, stage 3a ([place])  I will make no permanent changes to her medication.  Whitney [name] will continue to take Lasix 80 mg twice a day.  However I have asked her to take 1 dose of Zaroxolyn 5 mg 30 minutes before her a.m. dose of Lasix.  Then take Lasix as previously prescribed and continue to wear compression hose.  I hope that this will help augment the Lasix and facilitate diuresis.  We can then repeat this sparingly on an as-needed basis when the edema becomes severe.  Reassess next week

## 2020-08-21 ENCOUNTER — Telehealth: Payer: Self-pay

## 2020-08-21 NOTE — Progress Notes (Addendum)
Chronic Care Management Pharmacy Assistant   Name: Whitney [name]  MRN: [id number] DOB: 1927/06/23  Reason for Encounter: Adherence Review   Conditions to be addressed/monitored: HTN, CHF, Afib, Type II DM, Hypothyroidism,CKD, Hypercholesterolemia.  Recent office visits:  08/06/20 Dr. Dennard Schaumann For CHF. STARTED Metolazone 5 mg  Take 30 minutes before her morning furosemide (one time) 07/12/20 Dr. Dennard Schaumann For rectal bleeding Per note: Dr. Dennard Schaumann advised to STOP Eliquis. 07/04/20 Dr. Dennard Schaumann For rectal bleeding Per note :Advised to stop the Eliquis as well as the doctor advised her to go to the ER.   Recent consult visits:  07/29/20 Pulmonary Disease For pleural effusion right. No medication changes.  07/19/20 Joseph Pierini, MD. For follow-up. No medication changes. Per note: Dr. Dwyane Dee wanted Dr. Dennard Schaumann to increase the patients potassium. Potassium was increased on 07/24/20 to 40 meq a day (2 tabs) daily.   Hospital visits:  07/31/20 [facility] (3 Hours) Lajean Saver, MD. For leg swelling. Labs drawn.  07/04/20 [facility] (2 days) Shelly Coss, MD. For rectal bleeding. Per note: Resume Eliquis only on 07/12/20  Medications: Outpatient Encounter Medications as of 08/21/2020  Medication Sig   amiodarone (PACERONE) 200 MG tablet Take 1 tablet (200 mg total) by mouth daily.   atorvastatin (LIPITOR) 10 MG tablet Take 1 tablet (10 mg total) by mouth daily.   Blood Glucose Monitoring Suppl (ONETOUCH VERIO) w/Device KIT Use to test blood sugars daily.   cholecalciferol (VITAMIN D3) 25 MCG (1000 UNIT) tablet TAKE 1 TAB BY MOUTH EVERY DAY (Patient taking differently: Take 1,000 Units by mouth daily.)   docusate sodium (COLACE) 100 MG capsule Take 100 mg by mouth daily.   doxazosin (CARDURA) 4 MG tablet Take 0.5 tablets (2 mg total) by mouth daily.   ferrous sulfate 324 (65 Fe) MG TBEC TAKE 1 TABLET BY MOUTH IN THE MORNING AND 1 AT BEDTIME (Patient taking  differently: Take 324 mg by mouth 2 (two) times daily.)   furosemide (LASIX) 40 MG tablet TAKE 2 TABLETS BY MOUTH EVERY DAY   JANUVIA 100 MG tablet TAKE 0.5 TAB BY MOUTH DAILY (Patient taking differently: Take 50 mg by mouth daily.)   meclizine (ANTIVERT) 12.5 MG tablet TAKE 1 TABLET BY MOUTH DAILY AS NEEDED FOR DIZZINESS (Patient taking differently: Take 12.5 mg by mouth as needed for dizziness.)   metolazone (ZAROXOLYN) 5 MG tablet Take 1 tablet (5 mg total) by mouth once for 1 dose. Take 30 minutes before her morning furosemide (one time)   metoprolol succinate (TOPROL-XL) 100 MG 24 hr tablet TAKE 1 TABLET BY MOUTH TWICE A DAY WITH OR IMMEDIATELY FOLLOWING A MEAL (Patient taking differently: Take 100 mg by mouth 2 (two) times daily. WITH OR IMMEDIATELY FOLLOWING A MEAL)   ONETOUCH VERIO test strip CHECK BLOOD SUGAR EVERY DAY AS DIRECTED   polyethylene glycol (MIRALAX / GLYCOLAX) 17 g packet Take 17 g by mouth daily. Also available OTC (Patient taking differently: Take 17 g by mouth as needed for mild constipation.)   potassium chloride SA (KLOR-CON M20) 20 MEQ tablet Take 2 tablets (40 mEq total) by mouth daily.   tamoxifen (NOLVADEX) 20 MG tablet TAKE 1 TABLET BY MOUTH EVERY DAY (Patient taking differently: Take 20 mg by mouth daily.)   Facility-Administered Encounter Medications as of 08/21/2020  Medication   denosumab (PROLIA) injection 60 mg    Care Gaps: Patient stated she has been doing well. The patients granddaughter stated her grandmother probably  eats about 1-2 meals daily.The granddaughter stated she makes her grandmothers meals sometimes and sometimes they eat out.The patients granddaughter stated she doesn't do much activity, she just moves around the house.  She stated her grandmother is actively taking her medication and is not behind on her refills she stated they use [facility].   Star Rating Drugs: Atorvastatin 10 mg 90 DS 07/10/20  Follow-Up:Pharmacist Review  Charlann Lange, RMA Clinical Pharmacist Assistant [phone]  10 minutes spent in review, coordination, and documentation.  Reviewed by: Beverly Milch, PharmD Clinical Pharmacist [facility] [phone]

## 2020-08-25 DIAGNOSIS — D649 Anemia, unspecified: Secondary | ICD-10-CM | POA: Diagnosis not present

## 2020-08-25 DIAGNOSIS — I509 Heart failure, unspecified: Secondary | ICD-10-CM | POA: Diagnosis not present

## 2020-08-25 DIAGNOSIS — M810 Age-related osteoporosis without current pathological fracture: Secondary | ICD-10-CM | POA: Diagnosis not present

## 2020-09-01 ENCOUNTER — Emergency Department (HOSPITAL_COMMUNITY): Payer: Medicare Other

## 2020-09-01 ENCOUNTER — Inpatient Hospital Stay (HOSPITAL_COMMUNITY)
Admission: EM | Admit: 2020-09-01 | Discharge: 2020-09-13 | DRG: 871 | Disposition: E | Payer: Medicare Other | Attending: Internal Medicine | Admitting: Internal Medicine

## 2020-09-01 ENCOUNTER — Other Ambulatory Visit: Payer: Self-pay

## 2020-09-01 DIAGNOSIS — N1832 Chronic kidney disease, stage 3b: Secondary | ICD-10-CM | POA: Diagnosis not present

## 2020-09-01 DIAGNOSIS — Z87891 Personal history of nicotine dependence: Secondary | ICD-10-CM

## 2020-09-01 DIAGNOSIS — Z853 Personal history of malignant neoplasm of breast: Secondary | ICD-10-CM

## 2020-09-01 DIAGNOSIS — E119 Type 2 diabetes mellitus without complications: Secondary | ICD-10-CM

## 2020-09-01 DIAGNOSIS — L8990 Pressure ulcer of unspecified site, unspecified stage: Secondary | ICD-10-CM | POA: Insufficient documentation

## 2020-09-01 DIAGNOSIS — R509 Fever, unspecified: Secondary | ICD-10-CM | POA: Diagnosis not present

## 2020-09-01 DIAGNOSIS — R21 Rash and other nonspecific skin eruption: Secondary | ICD-10-CM | POA: Diagnosis not present

## 2020-09-01 DIAGNOSIS — W19XXXA Unspecified fall, initial encounter: Secondary | ICD-10-CM | POA: Diagnosis not present

## 2020-09-01 DIAGNOSIS — Z452 Encounter for adjustment and management of vascular access device: Secondary | ICD-10-CM

## 2020-09-01 DIAGNOSIS — I083 Combined rheumatic disorders of mitral, aortic and tricuspid valves: Secondary | ICD-10-CM | POA: Diagnosis not present

## 2020-09-01 DIAGNOSIS — H9190 Unspecified hearing loss, unspecified ear: Secondary | ICD-10-CM | POA: Diagnosis not present

## 2020-09-01 DIAGNOSIS — R6520 Severe sepsis without septic shock: Secondary | ICD-10-CM | POA: Diagnosis not present

## 2020-09-01 DIAGNOSIS — Z515 Encounter for palliative care: Secondary | ICD-10-CM | POA: Diagnosis not present

## 2020-09-01 DIAGNOSIS — N179 Acute kidney failure, unspecified: Secondary | ICD-10-CM | POA: Diagnosis not present

## 2020-09-01 DIAGNOSIS — J1282 Pneumonia due to coronavirus disease 2019: Secondary | ICD-10-CM | POA: Diagnosis not present

## 2020-09-01 DIAGNOSIS — L03113 Cellulitis of right upper limb: Secondary | ICD-10-CM | POA: Diagnosis not present

## 2020-09-01 DIAGNOSIS — Z66 Do not resuscitate: Secondary | ICD-10-CM | POA: Diagnosis not present

## 2020-09-01 DIAGNOSIS — A4101 Sepsis due to Methicillin susceptible Staphylococcus aureus: Principal | ICD-10-CM | POA: Diagnosis present

## 2020-09-01 DIAGNOSIS — E1165 Type 2 diabetes mellitus with hyperglycemia: Secondary | ICD-10-CM | POA: Diagnosis not present

## 2020-09-01 DIAGNOSIS — R7881 Bacteremia: Secondary | ICD-10-CM | POA: Diagnosis not present

## 2020-09-01 DIAGNOSIS — D539 Nutritional anemia, unspecified: Secondary | ICD-10-CM | POA: Diagnosis not present

## 2020-09-01 DIAGNOSIS — R451 Restlessness and agitation: Secondary | ICD-10-CM | POA: Diagnosis not present

## 2020-09-01 DIAGNOSIS — Z79899 Other long term (current) drug therapy: Secondary | ICD-10-CM

## 2020-09-01 DIAGNOSIS — I5032 Chronic diastolic (congestive) heart failure: Secondary | ICD-10-CM | POA: Diagnosis present

## 2020-09-01 DIAGNOSIS — E872 Acidosis: Secondary | ICD-10-CM | POA: Diagnosis present

## 2020-09-01 DIAGNOSIS — I469 Cardiac arrest, cause unspecified: Secondary | ICD-10-CM | POA: Diagnosis not present

## 2020-09-01 DIAGNOSIS — U071 COVID-19: Secondary | ICD-10-CM | POA: Diagnosis present

## 2020-09-01 DIAGNOSIS — E785 Hyperlipidemia, unspecified: Secondary | ICD-10-CM | POA: Diagnosis not present

## 2020-09-01 DIAGNOSIS — J90 Pleural effusion, not elsewhere classified: Secondary | ICD-10-CM | POA: Diagnosis not present

## 2020-09-01 DIAGNOSIS — J9601 Acute respiratory failure with hypoxia: Secondary | ICD-10-CM | POA: Diagnosis not present

## 2020-09-01 DIAGNOSIS — E039 Hypothyroidism, unspecified: Secondary | ICD-10-CM | POA: Diagnosis not present

## 2020-09-01 DIAGNOSIS — E1122 Type 2 diabetes mellitus with diabetic chronic kidney disease: Secondary | ICD-10-CM | POA: Diagnosis present

## 2020-09-01 DIAGNOSIS — A419 Sepsis, unspecified organism: Secondary | ICD-10-CM | POA: Insufficient documentation

## 2020-09-01 DIAGNOSIS — S51012A Laceration without foreign body of left elbow, initial encounter: Secondary | ICD-10-CM | POA: Diagnosis present

## 2020-09-01 DIAGNOSIS — Z7189 Other specified counseling: Secondary | ICD-10-CM | POA: Diagnosis not present

## 2020-09-01 DIAGNOSIS — Z96652 Presence of left artificial knee joint: Secondary | ICD-10-CM | POA: Diagnosis present

## 2020-09-01 DIAGNOSIS — M7989 Other specified soft tissue disorders: Secondary | ICD-10-CM | POA: Diagnosis not present

## 2020-09-01 DIAGNOSIS — R059 Cough, unspecified: Secondary | ICD-10-CM | POA: Diagnosis not present

## 2020-09-01 DIAGNOSIS — I4891 Unspecified atrial fibrillation: Secondary | ICD-10-CM | POA: Diagnosis not present

## 2020-09-01 DIAGNOSIS — Z743 Need for continuous supervision: Secondary | ICD-10-CM | POA: Diagnosis not present

## 2020-09-01 DIAGNOSIS — I480 Paroxysmal atrial fibrillation: Secondary | ICD-10-CM | POA: Diagnosis present

## 2020-09-01 DIAGNOSIS — Z90710 Acquired absence of both cervix and uterus: Secondary | ICD-10-CM

## 2020-09-01 DIAGNOSIS — R7401 Elevation of levels of liver transaminase levels: Secondary | ICD-10-CM | POA: Diagnosis present

## 2020-09-01 DIAGNOSIS — Z7984 Long term (current) use of oral hypoglycemic drugs: Secondary | ICD-10-CM

## 2020-09-01 DIAGNOSIS — D696 Thrombocytopenia, unspecified: Secondary | ICD-10-CM | POA: Diagnosis present

## 2020-09-01 DIAGNOSIS — M810 Age-related osteoporosis without current pathological fracture: Secondary | ICD-10-CM | POA: Diagnosis present

## 2020-09-01 DIAGNOSIS — Z91048 Other nonmedicinal substance allergy status: Secondary | ICD-10-CM

## 2020-09-01 DIAGNOSIS — R9431 Abnormal electrocardiogram [ECG] [EKG]: Secondary | ICD-10-CM | POA: Diagnosis present

## 2020-09-01 DIAGNOSIS — I130 Hypertensive heart and chronic kidney disease with heart failure and stage 1 through stage 4 chronic kidney disease, or unspecified chronic kidney disease: Secondary | ICD-10-CM | POA: Diagnosis present

## 2020-09-01 DIAGNOSIS — I10 Essential (primary) hypertension: Secondary | ICD-10-CM | POA: Diagnosis present

## 2020-09-01 DIAGNOSIS — N189 Chronic kidney disease, unspecified: Secondary | ICD-10-CM | POA: Diagnosis not present

## 2020-09-01 LAB — URINALYSIS, ROUTINE W REFLEX MICROSCOPIC
Bilirubin Urine: NEGATIVE
Glucose, UA: NEGATIVE mg/dL
Ketones, ur: NEGATIVE mg/dL
Leukocytes,Ua: NEGATIVE
Nitrite: NEGATIVE
Protein, ur: NEGATIVE mg/dL
Specific Gravity, Urine: 1.011 (ref 1.005–1.030)
pH: 5 (ref 5.0–8.0)

## 2020-09-01 LAB — COMPREHENSIVE METABOLIC PANEL
ALT: 84 U/L — ABNORMAL HIGH (ref 0–44)
AST: 98 U/L — ABNORMAL HIGH (ref 15–41)
Albumin: 3 g/dL — ABNORMAL LOW (ref 3.5–5.0)
Alkaline Phosphatase: 46 U/L (ref 38–126)
Anion gap: 12 (ref 5–15)
BUN: 42 mg/dL — ABNORMAL HIGH (ref 8–23)
CO2: 20 mmol/L — ABNORMAL LOW (ref 22–32)
Calcium: 8.3 mg/dL — ABNORMAL LOW (ref 8.9–10.3)
Chloride: 105 mmol/L (ref 98–111)
Creatinine, Ser: 2.26 mg/dL — ABNORMAL HIGH (ref 0.44–1.00)
GFR, Estimated: 20 mL/min — ABNORMAL LOW (ref >60)
Glucose, Bld: 198 mg/dL — ABNORMAL HIGH (ref 70–99)
Potassium: 4.2 mmol/L (ref 3.5–5.1)
Sodium: 137 mmol/L (ref 135–145)
Total Bilirubin: 1.4 mg/dL — ABNORMAL HIGH (ref 0.3–1.2)
Total Protein: 5.6 g/dL — ABNORMAL LOW (ref 6.5–8.1)

## 2020-09-01 LAB — LACTIC ACID, PLASMA: Lactic Acid, Venous: 2.6 mmol/L (ref 0.5–1.9)

## 2020-09-01 LAB — PROTIME-INR
INR: 1.3 — ABNORMAL HIGH (ref 0.8–1.2)
Prothrombin Time: 15.9 seconds — ABNORMAL HIGH (ref 11.4–15.2)

## 2020-09-01 LAB — APTT: aPTT: 32 seconds (ref 24–36)

## 2020-09-01 MED ORDER — CEFEPIME HCL 2 G IJ SOLR
2.0000 g | Freq: Once | INTRAMUSCULAR | Status: AC
  Administered 2020-09-01: 2 g via INTRAVENOUS
  Filled 2020-09-01: qty 2

## 2020-09-01 MED ORDER — METRONIDAZOLE 500 MG/100ML IV SOLN
500.0000 mg | Freq: Once | INTRAVENOUS | Status: AC
  Administered 2020-09-01: 500 mg via INTRAVENOUS
  Filled 2020-09-01: qty 100

## 2020-09-01 MED ORDER — VANCOMYCIN HCL IN DEXTROSE 1-5 GM/200ML-% IV SOLN
1000.0000 mg | Freq: Once | INTRAVENOUS | Status: AC
  Administered 2020-09-02: 1000 mg via INTRAVENOUS
  Filled 2020-09-01: qty 200

## 2020-09-01 MED ORDER — LACTATED RINGERS IV SOLN: INTRAVENOUS | Status: DC

## 2020-09-01 NOTE — Progress Notes (Signed)
A consult was received from an ED physician for cefepime and vancomycin per pharmacy dosing.  The patient's profile has been reviewed for ht/wt/allergies/indication/available labs.   A one time order has been placed for cefepime 2gm and vancomycin 1gm   Further antibiotics/pharmacy consults should be ordered by admitting physician if indicated.                       Thank you, Dolly Rias RPh 09/02/2020, 10:30 PM

## 2020-09-01 NOTE — ED Triage Notes (Signed)
BIB EMS from home. Daughter is her caregiver. C/o non-productive cough and rash to right arm x2 days. Skin tear to right arm

## 2020-09-02 ENCOUNTER — Encounter (HOSPITAL_COMMUNITY): Payer: Self-pay | Admitting: Family Medicine

## 2020-09-02 DIAGNOSIS — J9601 Acute respiratory failure with hypoxia: Secondary | ICD-10-CM | POA: Diagnosis not present

## 2020-09-02 DIAGNOSIS — N189 Chronic kidney disease, unspecified: Secondary | ICD-10-CM | POA: Diagnosis not present

## 2020-09-02 DIAGNOSIS — H9190 Unspecified hearing loss, unspecified ear: Secondary | ICD-10-CM | POA: Diagnosis present

## 2020-09-02 DIAGNOSIS — W19XXXA Unspecified fall, initial encounter: Secondary | ICD-10-CM | POA: Diagnosis present

## 2020-09-02 DIAGNOSIS — R6520 Severe sepsis without septic shock: Secondary | ICD-10-CM | POA: Diagnosis not present

## 2020-09-02 DIAGNOSIS — R21 Rash and other nonspecific skin eruption: Secondary | ICD-10-CM | POA: Diagnosis present

## 2020-09-02 DIAGNOSIS — Z66 Do not resuscitate: Secondary | ICD-10-CM | POA: Diagnosis not present

## 2020-09-02 DIAGNOSIS — E039 Hypothyroidism, unspecified: Secondary | ICD-10-CM | POA: Diagnosis present

## 2020-09-02 DIAGNOSIS — J1282 Pneumonia due to coronavirus disease 2019: Secondary | ICD-10-CM | POA: Diagnosis not present

## 2020-09-02 DIAGNOSIS — A419 Sepsis, unspecified organism: Secondary | ICD-10-CM | POA: Insufficient documentation

## 2020-09-02 DIAGNOSIS — Z7189 Other specified counseling: Secondary | ICD-10-CM | POA: Diagnosis not present

## 2020-09-02 DIAGNOSIS — L03113 Cellulitis of right upper limb: Secondary | ICD-10-CM

## 2020-09-02 DIAGNOSIS — D696 Thrombocytopenia, unspecified: Secondary | ICD-10-CM | POA: Diagnosis not present

## 2020-09-02 DIAGNOSIS — E785 Hyperlipidemia, unspecified: Secondary | ICD-10-CM | POA: Diagnosis present

## 2020-09-02 DIAGNOSIS — E1122 Type 2 diabetes mellitus with diabetic chronic kidney disease: Secondary | ICD-10-CM | POA: Diagnosis not present

## 2020-09-02 DIAGNOSIS — N1832 Chronic kidney disease, stage 3b: Secondary | ICD-10-CM | POA: Diagnosis not present

## 2020-09-02 DIAGNOSIS — Z515 Encounter for palliative care: Secondary | ICD-10-CM | POA: Diagnosis not present

## 2020-09-02 DIAGNOSIS — E872 Acidosis: Secondary | ICD-10-CM | POA: Diagnosis present

## 2020-09-02 DIAGNOSIS — N179 Acute kidney failure, unspecified: Secondary | ICD-10-CM | POA: Diagnosis not present

## 2020-09-02 DIAGNOSIS — I5032 Chronic diastolic (congestive) heart failure: Secondary | ICD-10-CM | POA: Diagnosis not present

## 2020-09-02 DIAGNOSIS — E1165 Type 2 diabetes mellitus with hyperglycemia: Secondary | ICD-10-CM | POA: Diagnosis not present

## 2020-09-02 DIAGNOSIS — I083 Combined rheumatic disorders of mitral, aortic and tricuspid valves: Secondary | ICD-10-CM | POA: Diagnosis present

## 2020-09-02 DIAGNOSIS — I130 Hypertensive heart and chronic kidney disease with heart failure and stage 1 through stage 4 chronic kidney disease, or unspecified chronic kidney disease: Secondary | ICD-10-CM | POA: Diagnosis not present

## 2020-09-02 DIAGNOSIS — A4101 Sepsis due to Methicillin susceptible Staphylococcus aureus: Secondary | ICD-10-CM | POA: Diagnosis not present

## 2020-09-02 DIAGNOSIS — U071 COVID-19: Secondary | ICD-10-CM | POA: Diagnosis not present

## 2020-09-02 DIAGNOSIS — D539 Nutritional anemia, unspecified: Secondary | ICD-10-CM | POA: Diagnosis present

## 2020-09-02 DIAGNOSIS — I480 Paroxysmal atrial fibrillation: Secondary | ICD-10-CM | POA: Diagnosis not present

## 2020-09-02 DIAGNOSIS — M7989 Other specified soft tissue disorders: Secondary | ICD-10-CM | POA: Diagnosis not present

## 2020-09-02 DIAGNOSIS — R7881 Bacteremia: Secondary | ICD-10-CM | POA: Diagnosis not present

## 2020-09-02 DIAGNOSIS — I469 Cardiac arrest, cause unspecified: Secondary | ICD-10-CM | POA: Diagnosis not present

## 2020-09-02 LAB — BLOOD CULTURE ID PANEL (REFLEXED) - BCID2

## 2020-09-02 LAB — COMPREHENSIVE METABOLIC PANEL
ALT: 72 U/L — ABNORMAL HIGH (ref 0–44)
AST: 83 U/L — ABNORMAL HIGH (ref 15–41)
Albumin: 2.4 g/dL — ABNORMAL LOW (ref 3.5–5.0)
Alkaline Phosphatase: 36 U/L — ABNORMAL LOW (ref 38–126)
Anion gap: 13 (ref 5–15)
BUN: 40 mg/dL — ABNORMAL HIGH (ref 8–23)
CO2: 17 mmol/L — ABNORMAL LOW (ref 22–32)
Calcium: 8 mg/dL — ABNORMAL LOW (ref 8.9–10.3)
Chloride: 109 mmol/L (ref 98–111)
Creatinine, Ser: 2.01 mg/dL — ABNORMAL HIGH (ref 0.44–1.00)
GFR, Estimated: 23 mL/min — ABNORMAL LOW (ref >60)
Glucose, Bld: 162 mg/dL — ABNORMAL HIGH (ref 70–99)
Potassium: 4.2 mmol/L (ref 3.5–5.1)
Sodium: 139 mmol/L (ref 135–145)
Total Bilirubin: 0.8 mg/dL (ref 0.3–1.2)
Total Protein: 5 g/dL — ABNORMAL LOW (ref 6.5–8.1)

## 2020-09-02 LAB — C-REACTIVE PROTEIN
CRP: 1.1 mg/dL — ABNORMAL HIGH (ref <1.0)
CRP: 2.7 mg/dL — ABNORMAL HIGH (ref <1.0)

## 2020-09-02 LAB — CBC WITH DIFFERENTIAL/PLATELET
Abs Immature Granulocytes: 0.05 10*3/uL (ref 0.00–0.07)
Band Neutrophils: 8 %
Basophils Absolute: 0 10*3/uL (ref 0.0–0.1)
Basophils Relative: 0 %
Basophils Relative: 0 %
Blasts: NONE SEEN %
Eosinophils Absolute: 0 10*3/uL (ref 0.0–0.5)
Eosinophils Relative: 0 %
Eosinophils Relative: 0 %
HCT: 30.7 % — ABNORMAL LOW (ref 36.0–46.0)
HCT: 29.6 % — ABNORMAL LOW (ref 36.0–46.0)
Hemoglobin: 10 g/dL — ABNORMAL LOW (ref 12.0–15.0)
Hemoglobin: 10 g/dL — ABNORMAL LOW (ref 12.0–15.0)
Immature Granulocytes: 1 %
Lymphocytes Relative: 3 %
Lymphocytes Relative: 7 %
Lymphs Abs: 0.4 10*3/uL — ABNORMAL LOW (ref 0.7–4.0)
MCH: 35 pg — ABNORMAL HIGH (ref 26.0–34.0)
MCH: 35.6 pg — ABNORMAL HIGH (ref 26.0–34.0)
MCHC: 32.6 g/dL (ref 30.0–36.0)
MCHC: 33.8 g/dL (ref 30.0–36.0)
MCV: 107.3 fL — ABNORMAL HIGH (ref 80.0–100.0)
MCV: 105.3 fL — ABNORMAL HIGH (ref 80.0–100.0)
Metamyelocytes Relative: NONE SEEN %
Monocytes Absolute: 0.3 10*3/uL (ref 0.1–1.0)
Monocytes Relative: 1 %
Monocytes Relative: 4 %
Myelocytes: NONE SEEN %
Neutro Abs: 6 10*3/uL (ref 1.7–7.7)
Neutrophils Relative %: 88 %
Neutrophils Relative %: 88 %
Platelets: 144 10*3/uL — ABNORMAL LOW (ref 150–400)
Platelets: 118 10*3/uL — ABNORMAL LOW (ref 150–400)
Promyelocytes Relative: NONE SEEN %
RBC Morphology: NORMAL
RBC: 2.86 MIL/uL — ABNORMAL LOW (ref 3.87–5.11)
RBC: 2.81 MIL/uL — ABNORMAL LOW (ref 3.87–5.11)
RDW: 15.6 % — ABNORMAL HIGH (ref 11.5–15.5)
RDW: 15.4 % (ref 11.5–15.5)
WBC Morphology: NORMAL
WBC: 6 10*3/uL (ref 4.0–10.5)
WBC: 6.8 10*3/uL (ref 4.0–10.5)
nRBC: 0 % (ref 0.0–0.2)
nRBC: NONE SEEN /100 WBC
nRBC: 0 % (ref 0.0–0.2)

## 2020-09-02 LAB — FERRITIN
Ferritin: 335 ng/mL — ABNORMAL HIGH (ref 11–307)
Ferritin: 343 ng/mL — ABNORMAL HIGH (ref 11–307)

## 2020-09-02 LAB — GLUCOSE, CAPILLARY
Glucose-Capillary: 167 mg/dL — ABNORMAL HIGH (ref 70–99)
Glucose-Capillary: 148 mg/dL — ABNORMAL HIGH (ref 70–99)
Glucose-Capillary: 203 mg/dL — ABNORMAL HIGH (ref 70–99)
Glucose-Capillary: 185 mg/dL — ABNORMAL HIGH (ref 70–99)

## 2020-09-02 LAB — MRSA PCR SCREENING: MRSA by PCR: NEGATIVE

## 2020-09-02 LAB — CREATININE, SERUM
Creatinine, Ser: 2.31 mg/dL — ABNORMAL HIGH (ref 0.44–1.00)
GFR, Estimated: 20 mL/min — ABNORMAL LOW (ref >60)

## 2020-09-02 LAB — LACTIC ACID, PLASMA
Lactic Acid, Venous: 2.9 mmol/L (ref 0.5–1.9)
Lactic Acid, Venous: 2.5 mmol/L (ref 0.5–1.9)

## 2020-09-02 LAB — BRAIN NATRIURETIC PEPTIDE: B Natriuretic Peptide: 781 pg/mL — ABNORMAL HIGH (ref 0.0–100.0)

## 2020-09-02 LAB — RESP PANEL BY RT-PCR (FLU A&B, COVID) ARPGX2
Influenza A by PCR: NEGATIVE
Influenza B by PCR: NEGATIVE
SARS Coronavirus 2 by RT PCR: POSITIVE — AB

## 2020-09-02 LAB — D-DIMER, QUANTITATIVE: D-Dimer, Quant: 2.9 ug/mL-FEU — ABNORMAL HIGH (ref 0.00–0.50)

## 2020-09-02 LAB — PROTIME-INR
INR: 1.4 — ABNORMAL HIGH (ref 0.8–1.2)
Prothrombin Time: 17.3 seconds — ABNORMAL HIGH (ref 11.4–15.2)

## 2020-09-02 LAB — PROCALCITONIN: Procalcitonin: 0.25 ng/mL

## 2020-09-02 LAB — APTT: aPTT: 30 seconds (ref 24–36)

## 2020-09-02 MED ORDER — SODIUM CHLORIDE 0.9 % IV SOLN
100.0000 mg | Freq: Every day | INTRAVENOUS | Status: AC
  Administered 2020-09-03 – 2020-09-06 (×4): 100 mg via INTRAVENOUS
  Filled 2020-09-02 (×5): qty 20

## 2020-09-02 MED ORDER — POTASSIUM CHLORIDE CRYS ER 20 MEQ PO TBCR
40.0000 meq | EXTENDED_RELEASE_TABLET | Freq: Every day | ORAL | Status: DC
  Administered 2020-09-02: 40 meq via ORAL
  Filled 2020-09-02 (×2): qty 2

## 2020-09-02 MED ORDER — AMIODARONE HCL 200 MG PO TABS
200.0000 mg | ORAL_TABLET | Freq: Every day | ORAL | Status: DC
  Administered 2020-09-02 – 2020-09-06 (×5): 200 mg via ORAL
  Filled 2020-09-02 (×5): qty 1

## 2020-09-02 MED ORDER — METOPROLOL SUCCINATE ER 25 MG PO TB24
100.0000 mg | ORAL_TABLET | Freq: Two times a day (BID) | ORAL | Status: DC
  Administered 2020-09-02 – 2020-09-06 (×9): 100 mg via ORAL
  Filled 2020-09-02 (×9): qty 1

## 2020-09-02 MED ORDER — HYDROCOD POLST-CPM POLST ER 10-8 MG/5ML PO SUER
5.0000 mL | Freq: Two times a day (BID) | ORAL | Status: DC | PRN
Start: 2020-09-02 — End: 2020-09-06

## 2020-09-02 MED ORDER — ASCORBIC ACID 500 MG PO TABS
500.0000 mg | ORAL_TABLET | Freq: Every day | ORAL | Status: DC
  Administered 2020-09-02 – 2020-09-06 (×5): 500 mg via ORAL
  Filled 2020-09-02 (×5): qty 1

## 2020-09-02 MED ORDER — LACTATED RINGERS IV SOLN: INTRAVENOUS | Status: AC

## 2020-09-02 MED ORDER — SODIUM CHLORIDE 0.9 % IV SOLN: 2.0000 g | Freq: Two times a day (BID) | INTRAVENOUS | Status: DC

## 2020-09-02 MED ORDER — ACETAMINOPHEN 325 MG PO TABS
650.0000 mg | ORAL_TABLET | Freq: Four times a day (QID) | ORAL | Status: DC | PRN
  Filled 2020-09-02: qty 2

## 2020-09-02 MED ORDER — ENOXAPARIN SODIUM 30 MG/0.3ML IJ SOSY
30.0000 mg | PREFILLED_SYRINGE | INTRAMUSCULAR | Status: DC
  Administered 2020-09-02 – 2020-09-06 (×5): 30 mg via SUBCUTANEOUS
  Filled 2020-09-02 (×5): qty 0.3

## 2020-09-02 MED ORDER — LINAGLIPTIN 5 MG PO TABS
5.0000 mg | ORAL_TABLET | Freq: Every day | ORAL | Status: DC
  Administered 2020-09-02 – 2020-09-06 (×5): 5 mg via ORAL
  Filled 2020-09-02 (×5): qty 1

## 2020-09-02 MED ORDER — SODIUM CHLORIDE 0.9 % IV SOLN
2.0000 g | INTRAVENOUS | Status: DC
  Administered 2020-09-02: 2 g via INTRAVENOUS
  Filled 2020-09-02: qty 2

## 2020-09-02 MED ORDER — VANCOMYCIN HCL IN DEXTROSE 1-5 GM/200ML-% IV SOLN: 1000.0000 mg | INTRAVENOUS | Status: DC

## 2020-09-02 MED ORDER — ATORVASTATIN CALCIUM 10 MG PO TABS
10.0000 mg | ORAL_TABLET | Freq: Every day | ORAL | Status: DC
  Administered 2020-09-02 – 2020-09-06 (×5): 10 mg via ORAL
  Filled 2020-09-02 (×5): qty 1

## 2020-09-02 MED ORDER — FUROSEMIDE 40 MG PO TABS
80.0000 mg | ORAL_TABLET | Freq: Every day | ORAL | Status: DC
  Filled 2020-09-02: qty 2

## 2020-09-02 MED ORDER — SODIUM CHLORIDE 0.9 % IV SOLN: 100.0000 mg | Freq: Every day | INTRAVENOUS | Status: DC

## 2020-09-02 MED ORDER — SODIUM CHLORIDE 0.9 % IV SOLN
100.0000 mg | Freq: Once | INTRAVENOUS | Status: AC
  Administered 2020-09-02: 100 mg via INTRAVENOUS
  Filled 2020-09-02: qty 20

## 2020-09-02 MED ORDER — ZINC SULFATE 220 (50 ZN) MG PO CAPS
220.0000 mg | ORAL_CAPSULE | Freq: Every day | ORAL | Status: DC
  Administered 2020-09-02 – 2020-09-06 (×5): 220 mg via ORAL
  Filled 2020-09-02 (×5): qty 1

## 2020-09-02 MED ORDER — ACETAMINOPHEN 500 MG PO TABS
1000.0000 mg | ORAL_TABLET | Freq: Once | ORAL | Status: AC
  Administered 2020-09-02: 1000 mg via ORAL
  Filled 2020-09-02: qty 2

## 2020-09-02 MED ORDER — SODIUM CHLORIDE 0.9 % IV SOLN: 200.0000 mg | Freq: Once | INTRAVENOUS | Status: DC

## 2020-09-02 MED ORDER — DEXAMETHASONE SODIUM PHOSPHATE 10 MG/ML IJ SOLN
6.0000 mg | Freq: Every day | INTRAMUSCULAR | Status: DC
  Administered 2020-09-02 – 2020-09-06 (×5): 6 mg via INTRAVENOUS
  Filled 2020-09-02 (×5): qty 1

## 2020-09-02 MED ORDER — GUAIFENESIN-DM 100-10 MG/5ML PO SYRP
10.0000 mL | ORAL_SOLUTION | ORAL | Status: DC | PRN
  Administered 2020-09-02 – 2020-09-04 (×2): 10 mL via ORAL
  Filled 2020-09-02 (×3): qty 10

## 2020-09-02 NOTE — H&P (Signed)
History and Physical    Whitney [name] HFG:[id number] DOB: 11/21/1927 DOA: 08/25/2020  PCP: [name], MD   Patient coming from:  Home  Chief Complaint: Weakness, fever  HPI: Whitney [name] is a 87 y.o. female with medical history significant for paroxysmal atrial fibrillation, diabetes mellitus type 2, hypertension, hyperlipidemia, chronic diastolic CHF presents with complaint of fever and weakness.  She reports that for the last few days she has not been feeling well and has had less energy than normal.  She reports being hot and having fever which she took Tylenol at home for.  She has had a dry cough.  She denies having any nausea, vomiting, diarrhea, urinary frequency or dysuria.  Has not been around anyone that she knows who has been sick.  She reports has been taking her medications as prescribed.  She has had her COVID-vaccine x2 and 1 booster shot.  She has no known COVID-19 exposures.  She denies chest pain or palpitations.  ED Course: In the emergency room patient is found to have sepsis with fever, tachycardia.  Labs revealed AKI superimposed on her chronic kidney disease.  She has bibasilar opacities on chest x-ray with a small right pleural effusion.  She was started on antibiotics in the emergency room.  She was given some IV fluid in the emergency room.  Lactic acid level was elevated at 2.6 WBC 6000, hemoglobin 10, hematocrit 30.7, platelets 144,000.  Sodium 137, potassium 4.2, chloride 105, bicarb 20, BUN 42, creatinine 2.26 1.6-1.8 AST 98, ALT 84, alkaline phosphatase 46, bilirubin 1.4, BNP 781 which is close to baseline level.  Hospitalist service was asked to admit for further management  Review of Systems:  General: Reports weakness, fever. Denies weight loss, night sweats.  Denies dizziness.  Denies change in appetite HENT: Denies head trauma, headache, denies change in hearing, tinnitus.  Denies nasal congestion or bleeding.  Denies sore throat.  Denies  difficulty swallowing Eyes: Denies blurry vision, pain in eye, drainage.  Denies discoloration of eyes. Neck: Denies pain.  Denies swelling.  Denies pain with movement. Cardiovascular: Denies chest pain, palpitations.  Denies edema.  Denies orthopnea Respiratory: Reports shortness of breath, cough.  Denies wheezing.  Denies sputum production Gastrointestinal: Denies abdominal pain, swelling.  Denies nausea, vomiting, diarrhea.  Denies melena.  Denies hematemesis. Musculoskeletal: Denies limitation of movement.   Denies pain.  Denies arthralgias or myalgias. Genitourinary: Denies pelvic pain.  Denies urinary frequency or hesitancy.  Denies dysuria.  Skin: Reports redness of right arm.  Denies petechiae, purpura, ecchymosis. Neurological: Denies syncope. Denies seizure activity. Denies paresthesia. Denies slurred speech, drooping face.  Denies visual change. Psychiatric: Denies depression, anxiety.  Denies hallucinations.  Past Medical History:  Diagnosis Date   Anemia    Atrial fibrillation ([place])    Breast cancer ([place]) 10/2013   right upper outer   Cancer ([place])    right breast   Complication of anesthesia    slow to wake up   Diabetes mellitus without complication ([place])    type 2   Dysrhythmia 10/15   AF   Former smoker    Full dentures    Hyperlipidemia    Hypertension    Multinodular goiter    Osteoporosis    Radiation    Right Breast   Thyroid disease    hypothyroidism   Wears glasses     Past Surgical History:  Procedure Laterality Date   ABDOMINAL HYSTERECTOMY     AXILLARY LYMPH NODE  DISSECTION Right 01/09/2014   Procedure: RIGHT AXILLARY LYMPH NODE DISECTION;  Surgeon: [name], MD;  Location: [place];  Service: General;  Laterality: Right;   BIOPSY  10/26/2019   Procedure: BIOPSY;  Surgeon: Irving Copas., MD;  Location: [facility];  Service: Gastroenterology;;   BIOPSY  03/11/2020   Procedure: BIOPSY;  Surgeon: Irving Copas., MD;  Location: [facility];  Service: Gastroenterology;;   BREAST LUMPECTOMY WITH RADIOACTIVE SEED LOCALIZATION Right 01/09/2014   Procedure: RIGHT BREAST SEED LOCALIZED LUMPECTOMY ;  Surgeon: [name], MD;  Location: [place];  Service: General;  Laterality: Right;   COLONOSCOPY WITH PROPOFOL N/A 10/26/2019   Procedure: COLONOSCOPY WITH PROPOFOL;  Surgeon: Irving Copas., MD;  Location: [facility];  Service: Gastroenterology;  Laterality: N/A;   COLONOSCOPY WITH PROPOFOL N/A 03/11/2020   Procedure: COLONOSCOPY WITH PROPOFOL- Ultraslim scope;  Surgeon: Irving Copas., MD;  Location: [place];  Service: Gastroenterology;  Laterality: N/A;   ENDOSCOPIC MUCOSAL RESECTION N/A 03/11/2020   Procedure: ENDOSCOPIC MUCOSAL RESECTION;  Surgeon: Rush Landmark Telford Nab., MD;  Location: [facility];  Service: Gastroenterology;  Laterality: N/A;   ESOPHAGOGASTRODUODENOSCOPY (EGD) WITH PROPOFOL N/A 10/26/2019   Procedure: ESOPHAGOGASTRODUODENOSCOPY (EGD) WITH PROPOFOL;  Surgeon: Rush Landmark Telford Nab., MD;  Location: [facility];  Service: Gastroenterology;  Laterality: N/A;   EYE SURGERY  12/22/2009   cataracts   HEMOSTASIS CLIP PLACEMENT  10/26/2019   Procedure: HEMOSTASIS CLIP PLACEMENT;  Surgeon: Irving Copas., MD;  Location: [facility];  Service: Gastroenterology;;  marking for transverse lesion   INTRAMEDULLARY (IM) NAIL INTERTROCHANTERIC Left 02/24/2014   Procedure: IM ROD LEFT HIP FX;  Surgeon: Alta Corning, MD;  Location: [place];  Service: Orthopedics;  Laterality: Left;   left distal radius fracture     2020   POLYPECTOMY  10/26/2019   Procedure: POLYPECTOMY;  Surgeon: Mansouraty, Telford Nab., MD;  Location: [facility];  Service: Gastroenterology;;   POLYPECTOMY  03/11/2020   Procedure: POLYPECTOMY;  Surgeon: Irving Copas., MD;  Location: [place];  Service: Gastroenterology;;   [facility] REMOVAL Right 01/09/2014    Procedure: REMOVAL PORT-A-CATH;  Surgeon: [name], MD;  Location: [place];  Service: General;  Laterality: Right;   PORTACATH PLACEMENT N/A 11/15/2013   Procedure: INSERTION PORT-A-CATH WITH ULTRA SOUND AND FLOROSCOPY;  Surgeon: [name], MD;  Location: [facility];  Service: General;  Laterality: N/A;   SUBMUCOSAL TATTOO INJECTION  10/26/2019   Procedure: SUBMUCOSAL TATTOO INJECTION;  Surgeon: Irving Copas., MD;  Location: [facility];  Service: Gastroenterology;;   TOTAL KNEE ARTHROPLASTY  2002   left    Social History  reports that she quit smoking about 61 years ago. She has a 5.00 pack-year smoking history. She has never used smokeless tobacco. She reports that she does not drink alcohol and does not use drugs.  Allergies  Allergen Reactions   Tape Other (See Comments)    Skin is somewhat sensitive    Family History  Problem Relation Age of Onset   Colon cancer Neg Hx    Esophageal cancer Neg Hx    Inflammatory bowel disease Neg Hx    Liver disease Neg Hx    Pancreatic cancer Neg Hx    Rectal cancer Neg Hx    Stomach cancer Neg Hx      Prior to Admission medications   Medication Sig Start Date End Date Taking? Authorizing Provider  amiodarone (PACERONE) 200 MG tablet Take 1 tablet (  200 mg total) by mouth daily. 02/05/20   [name], MD  atorvastatin (LIPITOR) 10 MG tablet Take 1 tablet (10 mg total) by mouth daily. 05/03/20   [name], MD  Blood Glucose Monitoring Suppl (ONETOUCH VERIO) w/Device KIT Use to test blood sugars daily. 11/23/17   Elayne Snare, MD  cholecalciferol (VITAMIN D3) 25 MCG (1000 UNIT) tablet TAKE 1 TAB BY MOUTH EVERY DAY Patient taking differently: Take 1,000 Units by mouth daily. 06/03/20   [name], MD  docusate sodium (COLACE) 100 MG capsule Take 100 mg by mouth daily.    [provider]  doxazosin (CARDURA) 4 MG tablet Take 0.5 tablets (2 mg total) by mouth daily. 10/30/19   Rai,  Ripudeep K, MD  ferrous sulfate 324 (65 Fe) MG TBEC TAKE 1 TABLET BY MOUTH IN THE MORNING AND 1 AT BEDTIME Patient taking differently: Take 324 mg by mouth 2 (two) times daily. 05/07/20   [name], MD  furosemide (LASIX) 40 MG tablet TAKE 2 TABLETS BY MOUTH EVERY DAY 07/24/20   [name], MD  JANUVIA 100 MG tablet TAKE 0.5 TAB BY MOUTH DAILY Patient taking differently: Take 50 mg by mouth daily. 06/03/20   [name], MD  meclizine (ANTIVERT) 12.5 MG tablet TAKE 1 TABLET BY MOUTH DAILY AS NEEDED FOR DIZZINESS Patient taking differently: Take 12.5 mg by mouth as needed for dizziness. 06/03/20   [name], MD  metolazone (ZAROXOLYN) 5 MG tablet Take 1 tablet (5 mg total) by mouth once for 1 dose. Take 30 minutes before her morning furosemide (one time) 08/06/20 08/06/20  [name], MD  metoprolol succinate (TOPROL-XL) 100 MG 24 hr tablet TAKE 1 TABLET BY MOUTH TWICE A DAY WITH OR IMMEDIATELY FOLLOWING A MEAL Patient taking differently: Take 100 mg by mouth 2 (two) times daily. WITH OR IMMEDIATELY FOLLOWING A MEAL 02/05/20   Elayne Snare, MD  [facility] VERIO test strip CHECK BLOOD SUGAR EVERY DAY AS DIRECTED 02/05/20   Elayne Snare, MD  polyethylene glycol (MIRALAX / GLYCOLAX) 17 g packet Take 17 g by mouth daily. Also available OTC Patient taking differently: Take 17 g by mouth as needed for mild constipation. 10/31/19   Rai, Ripudeep K, MD  potassium chloride SA (KLOR-CON M20) 20 MEQ tablet Take 2 tablets (40 mEq total) by mouth daily. 07/24/20   [name], MD  tamoxifen (NOLVADEX) 20 MG tablet TAKE 1 TABLET BY MOUTH EVERY DAY Patient taking differently: Take 20 mg by mouth daily. 03/25/20   Nicholas Lose, MD    Physical Exam: Vitals:   08/30/2020 2330 08/26/2020 2345 09/02/20 0000 09/02/20 0030  BP: 106/64 121/88 120/89 (!) 119/94  Pulse: 82 (!) 108 (!) 105 100  Resp: 19 18 18 19   Temp:      TempSrc:      SpO2: 97% 96% 98% 97%    Constitutional: NAD,  calm, comfortable Vitals:   08/28/2020 2330 08/23/2020 2345 09/02/20 0000 09/02/20 0030  BP: 106/64 121/88 120/89 (!) 119/94  Pulse: 82 (!) 108 (!) 105 100  Resp: 19 18 18 19   Temp:      TempSrc:      SpO2: 97% 96% 98% 97%   General: WDWN, Alert and oriented x3.  Eyes: EOMI, PERRL, conjunctivae normal.  Sclera nonicteric HENT:  [place]/AT, external ears normal.  Nares patent without epistasis.  Mucous membranes are dry Neck: Soft, normal range of motion, supple, no masses,Trachea midline Respiratory: Equal but  diminished breath sounds in bases, no wheezing, no crackles. Normal respiratory effort. No accessory muscle use.  Cardiovascular: Irregularly irregular rhythm with tachycardia.  No murmurs / rubs / gallops.  1+ pedal pulses. Abdomen: Soft, no tenderness, nondistended, no rebound or guarding.  No masses palpated. Bowel sounds normoactive Musculoskeletal: FROM. No cyanosis. No joint deformity upper and lower extremities. Normal muscle tone.  Skin: Warm, dry, intact no ulcers.  Has erythematous rash of right medial forearm and upper arm is warm to touch with mild induration.  No pustules, drainage or fluctuation. Neurologic: CN 2-12 grossly intact.  Normal speech.  Sensation intact. Strength 4/5 in all extremities.   Psychiatric: Normal judgment and insight.  Normal mood.    Labs on Admission: I have personally reviewed following labs and imaging studies  CBC: Recent Labs  Lab 08/15/2020 2314  WBC 6.0  HGB 10.0*  HCT 30.7*  MCV 107.3*  PLT 144*    Basic Metabolic Panel: Recent Labs  Lab 09/07/2020 2245  NA 137  K 4.2  CL 105  CO2 20*  GLUCOSE 198*  BUN 42*  CREATININE 2.26*  CALCIUM 8.3*    GFR: CrCl cannot be calculated (Unknown ideal weight.).  Liver Function Tests: Recent Labs  Lab 09/07/2020 2245  AST 98*  ALT 84*  ALKPHOS 46  BILITOT 1.4*  PROT 5.6*  ALBUMIN 3.0*    Urine analysis:    Component Value Date/Time   COLORURINE YELLOW 08/27/2020 2245    APPEARANCEUR CLEAR 08/14/2020 2245   LABSPEC 1.011 09/11/2020 2245   PHURINE 5.0 08/15/2020 2245   GLUCOSEU NEGATIVE 08/23/2020 2245   GLUCOSEU NEGATIVE 11/24/2012 0831   HGBUR SMALL (A) 09/09/2020 2245   BILIRUBINUR NEGATIVE 08/31/2020 2245   BILIRUBINUR neg 09/19/2014 1605   KETONESUR NEGATIVE 08/19/2020 2245   PROTEINUR NEGATIVE 08/14/2020 2245   UROBILINOGEN 0.2 09/19/2014 1605   UROBILINOGEN 0.2 02/24/2014 0024   NITRITE NEGATIVE 08/31/2020 2245   LEUKOCYTESUR NEGATIVE 08/26/2020 2245    Radiological Exams on Admission: DG Chest Port 1 View  Result Date: 08/16/2020 CLINICAL DATA:  New cough for 2 days, possible sepsis, initial encounter EXAM: PORTABLE CHEST 1 VIEW COMPARISON:  07/04/2020 FINDINGS: Cardiac shadow is enlarged but stable. Aortic calcifications are noted. Right-sided pleural effusion is again seen. Bibasilar airspace opacities are noted. No bony abnormality is noted. IMPRESSION: Bibasilar opacity with associated right-sided pleural effusion. The left basilar changes are new from the prior exam. Electronically Signed   By: Inez Catalina M.D.   On: 08/29/2020 22:57    EKG: Independently reviewed.  EKG shows atrial fibrillation.  No acute ST elevation or depression.  QTc prolonged at 589  Assessment/Plan Principal Problem:   Sepsis  Whitney [name] is admitted to telemetry floor.  She meets sepsis criteria with fever, tachycardia, pneumonia, COVID-positive, AKI.  Patient is given IV fluid hydration in the emergency room Cultures obtained which will be monitored  Active Problems:   Pneumonia due to COVID-19 virus Whitney [name] with infiltrates on chest x-ray and was placed on antibiotic coverage with vancomycin cefepime and Flagyl in the emergency room.  We will continue cefepime and vancomycin.  No compelling reason for anaerobic coverage with Flagyl at this time. Discussed with pharmacist Started on remdesivir. Check inflammatory labs with COVID    DM2 (diabetes mellitus, type  2) Patient takes Januvia at home.  Tradjenta ordered due to formulary of the hospital while admitted.  Monitor blood sugars And had hemoglobin A1c on Jul 19, 2020 which was 5.9.  Does not need to be repeated at this time    Essential hypertension, benign Continue metoprolol.  Monitor blood pressure    Cellulitis of right arm Patient placed on antibiotic coverage with vancomycin and cefepime.  We will continue to monitor for improvement with antibiotic therapy    Acute kidney injury superimposed on CKD  Creatinine levels increased over baseline.  Gentle IV fluid hydration overnight. Recheck electrolytes and renal function in morning    Chronic diastolic CHF (congestive heart failure), NYHA class 2  Stable.  No signs of fluid overload at this time    PAF (paroxysmal atrial fibrillation)  Continue home medications of amiodarone and metoprolol.  Patient was on Eliquis but was taken off of it in April of this year after episodes of rectal bleeding    Prolonged QT interval Avoid medications which could further prolong QT interval.  Monitor on telemetry    DVT prophylaxis: Lovenox for DVT prophylaxis.   Code Status:   Full Code  Family Communication:  Diagnosis and plan discussed with patient.  Patient verbalized understanding and agrees with plan.  Further recommendations to follow as clinically indicated Disposition Plan:   Patient is from:  Home  Anticipated DC to:  Home   Anticipated DC date:  Anticipate more than 2 midnight stay in the hospital  Anticipated DC barriers: No barriers to discharge identified at this time  Admission status:  Inpatient   Yevonne Aline Jaylie Neaves MD [facility]  How to contact the [facility] Attending or Consulting provider [place] or covering provider during after hours [place], for this patient?   Check the care team in [facility] and look for a) attending/consulting [facility] provider listed and b) the [facility] team listed Log into [email or web address] and use [place]'s  universal password to access. If you do not have the password, please contact the hospital operator. Locate the [facility] provider you are looking for under [facility] and page to a number that you can be directly reached. If you still have difficulty reaching the provider, please page the [facility] (Director on Call) for the Hospitalists listed on amion for assistance.  09/02/2020, 12:53 AM

## 2020-09-02 NOTE — Progress Notes (Signed)
Patient ID: Whitney [name], female   DOB: 1927/10/20, 87 y.o.   MRN: [id number] Patient admitted early this morning for weakness and fever and was found to be COVID-positive with possible right arm cellulitis and has been started on antibiotics.  Patient seen and examined at bedside.  I have reviewed patient's medical records including this morning's H&P, current vitals, labs, medications myself.  Continue remdesivir, antibiotics.  Follow procalcitonin.  Monitor inflammatory markers daily.

## 2020-09-02 NOTE — Progress Notes (Addendum)
[facility] Antibiotic Note  Whitney [name] is a 87 y.o. female admitted on 08/31/2020 with non-productive cough and rash to right arm  Pharmacy has been consulted to dose vancomycin for sepsis  Plan: Vancomycin 1gm IV q48h (AUC 529.9, Scr 2.26) Cefepime per MD renally adjusted to 2gm q24h Follow renal function, cultures and clinical course     Temp (24hrs), Avg:100.1 F (37.8 C), Min:99.1 F (37.3 C), Max:101.1 F (38.4 C)  Recent Labs  Lab 08/23/2020 2245 08/31/2020 2314  WBC  --  6.0  CREATININE 2.26*  --   LATICACIDVEN 2.6*  --     CrCl cannot be calculated (Unknown ideal weight.).    Allergies  Allergen Reactions   Tape Other (See Comments)    Skin is somewhat sensitive    Antimicrobials this admission: 6/19 cefepime>> 6/19 flagyl x 1 6/20 vanc >>  Dose adjustments this admission:   Microbiology results: 6/19 BCx:  6/19 UCx:     Thank you for allowing pharmacy to be a part of this patient's care.  Dolly Rias RPh 09/02/2020, 1:02 AM

## 2020-09-02 NOTE — Progress Notes (Signed)
Pt being followed by [facility] for Sepsis Protocol.

## 2020-09-02 NOTE — Progress Notes (Signed)
Right arm  erythematous rash present on admission. Md and ED nurse addressed in their note

## 2020-09-02 NOTE — Progress Notes (Signed)
Update provided to daughter Thayer Headings. She would like an additional update from the MD once she is seen by him. If Thayer Headings is unreachable the granddaughter Aggie Hacker is available for phone calls to take updates.

## 2020-09-02 NOTE — ED Provider Notes (Signed)
[facility] DEPT Provider Note   CSN: [id number] Arrival date & time: 08/27/2020  2205     History No chief complaint on file.   Whitney [name] is a 87 y.o. female presents to the Emergency Department via EMS with complaints of nonproductive cough and rash to the right arm for the last 2 days.  Family reports patient has been somewhat weaker over the last several days and had a fall 1 week ago with skin tear to the left arm.  They have been keeping it clean and bandaged.  Patient lives at home with her daughter who is the caregiver.  Patient reports she has generally felt unwell but denies headache, neck pain, chest pain, shortness of breath, abdominal pain, nausea, vomiting, diarrhea.  Patient reports that her right arm is swollen and painful.  Denies known injury to the arm.  No specific aggravating or alleviating factors.  No treatments prior to arrival.  The history is provided by the patient, medical records and the EMS personnel. No language interpreter was used.      Past Medical History:  Diagnosis Date   Anemia    Atrial fibrillation ([place])    Breast cancer ([place]) 10/2013   right upper outer   Cancer ([place])    right breast   Complication of anesthesia    slow to wake up   Diabetes mellitus without complication ([place])    type 2   Dysrhythmia 10/15   AF   Former smoker    Full dentures    Hyperlipidemia    Hypertension    Multinodular goiter    Osteoporosis    Radiation    Right Breast   Thyroid disease    hypothyroidism   Wears glasses     Patient Active Problem List   Diagnosis Date Noted   Pneumonia due to COVID-19 virus 09/02/2020   Cellulitis of right arm 09/02/2020   Acute kidney injury superimposed on CKD ([place]) 09/02/2020   Sepsis ([place]) 09/02/2020   Rectal bleed 07/05/2020   Rectal bleeding 07/04/2020   Prolonged QT interval 07/04/2020   History of breast cancer 07/04/2020   Chronic colitis 05/21/2020   Tubular adenoma  with multifocal HGD and foci 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 ([place]) 05/21/2020   History of colon polyps 12/01/2019   Colitis, acute 12/01/2019   Diverticulosis 12/01/2019   Stenosis colon ([place]) 12/01/2019   Abnormal colonoscopy 12/01/2019   Macrocytic anemia 10/25/2019   Bright red blood per rectum 10/24/2019   Stage 3b chronic kidney disease ([place]) 10/24/2019   Coagulopathy ([employer])    Osteoporosis    Unilateral primary osteoarthritis, right knee 03/25/2018   Chronic pain of right knee 05/25/2017   Bacteremia 10/25/2016   Transaminitis 10/23/2016   Sinus bradycardia 10/23/2016   Controlled diabetes mellitus type 2 with complications (HCC)    Paroxysmal atrial fibrillation (HCC)    Chronic diastolic CHF (congestive heart failure) ([place])    Pulmonary hypertension ([place])    Acute renal failure superimposed on stage 2 chronic kidney disease ([place])    Elevated liver enzymes    Bacteremia due to Escherichia coli 12/30/2015   UTI (urinary tract infection) 12/30/2015   Acute blood loss anemia 02/25/2014   Closed left hip fracture ([place]) 02/24/2014   PAF (paroxysmal atrial fibrillation) ([place]) 01/30/2014   Chronic anticoagulation 01/30/2014   Chronic diastolic CHF (congestive heart failure), NYHA class 2 ([place]) 12/10/2013   Preoperative cardiovascular examination 12/10/2013   Neutropenic fever ([place]) 12/08/2013  Anemia of chronic disease 12/08/2013   Thrombocytopenia ([place]) 12/08/2013   Hypokalemia 12/08/2013   Pancytopenia due to antineoplastic chemotherapy ([place]) 12/08/2013   Hypothyroidism 12/08/2013   Breast cancer of upper-outer quadrant of right female breast ([place]) 11/01/2013   Essential hypertension, benign 12/01/2012   Pure hypercholesterolemia 12/01/2012   DM2 (diabetes mellitus, type 2) ([place]) 11/21/2012    Past Surgical History:  Procedure Laterality Date   ABDOMINAL HYSTERECTOMY     AXILLARY LYMPH NODE DISSECTION Right 01/09/2014   Procedure: RIGHT  AXILLARY LYMPH NODE DISECTION;  Surgeon: [name], MD;  Location: [facility];  Service: General;  Laterality: Right;   BIOPSY  10/26/2019   Procedure: BIOPSY;  Surgeon: Irving Copas., MD;  Location: [facility];  Service: Gastroenterology;;   BIOPSY  03/11/2020   Procedure: BIOPSY;  Surgeon: Irving Copas., MD;  Location: [facility] ENDOSCOPY;  Service: Gastroenterology;;   BREAST LUMPECTOMY WITH RADIOACTIVE SEED LOCALIZATION Right 01/09/2014   Procedure: RIGHT BREAST SEED LOCALIZED LUMPECTOMY ;  Surgeon: [name], MD;  Location: [facility];  Service: General;  Laterality: Right;   COLONOSCOPY WITH PROPOFOL N/A 10/26/2019   Procedure: COLONOSCOPY WITH PROPOFOL;  Surgeon: Irving Copas., MD;  Location: [facility];  Service: Gastroenterology;  Laterality: N/A;   COLONOSCOPY WITH PROPOFOL N/A 03/11/2020   Procedure: COLONOSCOPY WITH PROPOFOL- Ultraslim scope;  Surgeon: Irving Copas., MD;  Location: [place];  Service: Gastroenterology;  Laterality: N/A;   ENDOSCOPIC MUCOSAL RESECTION N/A 03/11/2020   Procedure: ENDOSCOPIC MUCOSAL RESECTION;  Surgeon: Rush Landmark Telford Nab., MD;  Location: [place];  Service: Gastroenterology;  Laterality: N/A;   ESOPHAGOGASTRODUODENOSCOPY (EGD) WITH PROPOFOL N/A 10/26/2019   Procedure: ESOPHAGOGASTRODUODENOSCOPY (EGD) WITH PROPOFOL;  Surgeon: Rush Landmark Telford Nab., MD;  Location: [facility];  Service: Gastroenterology;  Laterality: N/A;   EYE SURGERY  12/22/2009   cataracts   HEMOSTASIS CLIP PLACEMENT  10/26/2019   Procedure: HEMOSTASIS CLIP PLACEMENT;  Surgeon: Irving Copas., MD;  Location: [facility]OS[facility];  Service: Gastroenterology;;  marking for transverse lesion   INTRAMEDULLARY (IM) NAIL INTERTROCHANTERIC Left 02/24/2014   Procedure: IM ROD LEFT HIP FX;  Surgeon: Alta Corning, MD;  Location: [place];  Service: Orthopedics;  Laterality: Left;   left distal radius fracture      2020   POLYPECTOMY  10/26/2019   Procedure: POLYPECTOMY;  Surgeon: Mansouraty, Telford Nab., MD;  Location: [facility];  Service: Gastroenterology;;   POLYPECTOMY  03/11/2020   Procedure: POLYPECTOMY;  Surgeon: Irving Copas., MD;  Location: [place];  Service: Gastroenterology;;   [facility] REMOVAL Right 01/09/2014   Procedure: REMOVAL PORT-A-CATH;  Surgeon: [name], MD;  Location: [facility];  Service: General;  Laterality: Right;   PORTACATH PLACEMENT N/A 11/15/2013   Procedure: INSERTION PORT-A-CATH WITH ULTRA SOUND AND FLOROSCOPY;  Surgeon: [name], MD;  Location: [facility];  Service: General;  Laterality: N/A;   SUBMUCOSAL TATTOO INJECTION  10/26/2019   Procedure: SUBMUCOSAL TATTOO INJECTION;  Surgeon: Irving Copas., MD;  Location: [facility];  Service: Gastroenterology;;   TOTAL KNEE ARTHROPLASTY  2002   left     OB History   No obstetric history on file.     Family History  Problem Relation Age of Onset   Colon cancer Neg Hx    Esophageal cancer Neg Hx    Inflammatory bowel disease Neg Hx    Liver disease Neg Hx    Pancreatic cancer Neg Hx    Rectal cancer Neg Hx  Stomach cancer Neg Hx     Social History   Tobacco Use   Smoking status: Former    Packs/day: 1.00    Years: 5.00    Pack years: 5.00    Types: Cigarettes    Quit date: 11/14/1958    Years since quitting: 61.8   Smokeless tobacco: Never  Vaping Use   Vaping Use: Never used  Substance Use Topics   Alcohol use: No    Alcohol/week: 0.0 standard drinks   Drug use: No    Home Medications Prior to Admission medications   Medication Sig Start Date End Date Taking? Authorizing Provider  amiodarone (PACERONE) 200 MG tablet Take 1 tablet (200 mg total) by mouth daily. 02/05/20   [name], MD  atorvastatin (LIPITOR) 10 MG tablet Take 1 tablet (10 mg total) by mouth daily. 05/03/20   [name], MD  Blood Glucose Monitoring Suppl  (ONETOUCH VERIO) w/Device KIT Use to test blood sugars daily. 11/23/17   Elayne Snare, MD  cholecalciferol (VITAMIN D3) 25 MCG (1000 UNIT) tablet TAKE 1 TAB BY MOUTH EVERY DAY Patient taking differently: Take 1,000 Units by mouth daily. 06/03/20   [name], MD  docusate sodium (COLACE) 100 MG capsule Take 100 mg by mouth daily.    [provider]  doxazosin (CARDURA) 4 MG tablet Take 0.5 tablets (2 mg total) by mouth daily. 10/30/19   Rai, Ripudeep K, MD  ferrous sulfate 324 (65 Fe) MG TBEC TAKE 1 TABLET BY MOUTH IN THE MORNING AND 1 AT BEDTIME Patient taking differently: Take 324 mg by mouth 2 (two) times daily. 05/07/20   [name], MD  furosemide (LASIX) 40 MG tablet TAKE 2 TABLETS BY MOUTH EVERY DAY 07/24/20   [name], MD  JANUVIA 100 MG tablet TAKE 0.5 TAB BY MOUTH DAILY Patient taking differently: Take 50 mg by mouth daily. 06/03/20   [name], MD  meclizine (ANTIVERT) 12.5 MG tablet TAKE 1 TABLET BY MOUTH DAILY AS NEEDED FOR DIZZINESS Patient taking differently: Take 12.5 mg by mouth as needed for dizziness. 06/03/20   [name], MD  metolazone (ZAROXOLYN) 5 MG tablet Take 1 tablet (5 mg total) by mouth once for 1 dose. Take 30 minutes before her morning furosemide (one time) 08/06/20 08/06/20  [name], MD  metoprolol succinate (TOPROL-XL) 100 MG 24 hr tablet TAKE 1 TABLET BY MOUTH TWICE A DAY WITH OR IMMEDIATELY FOLLOWING A MEAL Patient taking differently: Take 100 mg by mouth 2 (two) times daily. WITH OR IMMEDIATELY FOLLOWING A MEAL 02/05/20   Elayne Snare, MD  [facility] VERIO test strip CHECK BLOOD SUGAR EVERY DAY AS DIRECTED 02/05/20   Elayne Snare, MD  polyethylene glycol (MIRALAX / GLYCOLAX) 17 g packet Take 17 g by mouth daily. Also available OTC Patient taking differently: Take 17 g by mouth as needed for mild constipation. 10/31/19   Rai, Ripudeep K, MD  potassium chloride SA (KLOR-CON M20) 20 MEQ tablet Take 2 tablets (40 mEq total)  by mouth daily. 07/24/20   [name], MD  tamoxifen (NOLVADEX) 20 MG tablet TAKE 1 TABLET BY MOUTH EVERY DAY Patient taking differently: Take 20 mg by mouth daily. 03/25/20   Nicholas Lose, MD    Allergies    Tape  Review of Systems   Review of Systems  Constitutional:  Positive for fatigue and fever. Negative for appetite change, diaphoresis and unexpected weight change.  HENT:  Negative for mouth sores.  Eyes:  Negative for visual disturbance.  Respiratory:  Positive for cough. Negative for chest tightness, shortness of breath and wheezing.   Cardiovascular:  Negative for chest pain.  Gastrointestinal:  Negative for abdominal pain, constipation, diarrhea, nausea and vomiting.  Endocrine: Negative for polydipsia, polyphagia and polyuria.  Genitourinary:  Negative for dysuria, frequency, hematuria and urgency.  Musculoskeletal:  Positive for myalgias. Negative for back pain and neck stiffness.  Skin:  Positive for rash and wound.  Allergic/Immunologic: Negative for immunocompromised state.  Neurological:  Negative for syncope, light-headedness and headaches.  Hematological:  Does not bruise/bleed easily.  Psychiatric/Behavioral:  Negative for sleep disturbance. The patient is not nervous/anxious.    Physical Exam Updated Vital Signs BP 121/88   Pulse (!) 108   Temp (!) 101.1 F (38.4 C) (Rectal)   Resp 18   LMP  (LMP Unknown)   SpO2 96%   Physical Exam Vitals and nursing note reviewed.  Constitutional:      General: She is not in acute distress.    Appearance: She is not diaphoretic.  HENT:     Head: Normocephalic.     Nose: Nose normal.     Mouth/Throat:     Mouth: Mucous membranes are moist.  Eyes:     General: No scleral icterus.    Conjunctiva/sclera: Conjunctivae normal.  Cardiovascular:     Rate and Rhythm: Regular rhythm. Tachycardia present.     Pulses: Normal pulses.          Radial pulses are 2+ on the right side and 2+ on the left side.   Pulmonary:     Effort: Tachypnea present. No accessory muscle usage, prolonged expiration, respiratory distress or retractions.     Breath sounds: No stridor. Examination of the right-lower field reveals rhonchi. Examination of the left-lower field reveals rhonchi. Rhonchi present.     Comments: Equal chest rise. No increased work of breathing. Abdominal:     General: There is no distension.     Palpations: Abdomen is soft.     Tenderness: There is no abdominal tenderness. There is no guarding or rebound.  Musculoskeletal:     Cervical back: Normal range of motion.     Comments: Moves all extremities equally and without difficulty.  Skin:    General: Skin is warm and dry.     Capillary Refill: Capillary refill takes less than 2 seconds.     Comments: Skin tear on the left elbow - see photo Violaceous rash to the right arm with significant edema and tenderness to palpation - see photo  Neurological:     Mental Status: She is alert.     GCS: GCS eye subscore is 4. GCS verbal subscore is 5. GCS motor subscore is 6.     Comments: Speech is clear and goal oriented.  Psychiatric:        Mood and Affect: Mood normal.           ED Results / Procedures / Treatments   Labs (all labs ordered are listed, but only abnormal results are displayed) Labs Reviewed  RESP PANEL BY RT-PCR (FLU A&B, COVID) ARPGX2 - Abnormal; Notable for the following components:      Result Value   SARS Coronavirus 2 by RT PCR POSITIVE (*)    All other components within normal limits  LACTIC ACID, PLASMA - Abnormal; Notable for the following components:   Lactic Acid, Venous 2.6 (*)    All other components within normal limits  COMPREHENSIVE METABOLIC  PANEL - Abnormal; Notable for the following components:   CO2 20 (*)    Glucose, Bld 198 (*)    BUN 42 (*)    Creatinine, Ser 2.26 (*)    Calcium 8.3 (*)    Total Protein 5.6 (*)    Albumin 3.0 (*)    AST 98 (*)    ALT 84 (*)    Total Bilirubin 1.4 (*)     GFR, Estimated 20 (*)    All other components within normal limits  PROTIME-INR - Abnormal; Notable for the following components:   Prothrombin Time 15.9 (*)    INR 1.3 (*)    All other components within normal limits  URINALYSIS, ROUTINE W REFLEX MICROSCOPIC - Abnormal; Notable for the following components:   Hgb urine dipstick SMALL (*)    Bacteria, UA RARE (*)    All other components within normal limits  BRAIN NATRIURETIC PEPTIDE - Abnormal; Notable for the following components:   B Natriuretic Peptide 781.0 (*)    All other components within normal limits  CBC WITH DIFFERENTIAL/PLATELET - Abnormal; Notable for the following components:   RBC 2.86 (*)    Hemoglobin 10.0 (*)    HCT 30.7 (*)    MCV 107.3 (*)    MCH 35.0 (*)    RDW 15.6 (*)    Platelets 144 (*)    All other components within normal limits  D-DIMER, QUANTITATIVE - Abnormal; Notable for the following components:   D-Dimer, Quant 2.90 (*)    All other components within normal limits  C-REACTIVE PROTEIN - Abnormal; Notable for the following components:   CRP 1.1 (*)    All other components within normal limits  FERRITIN - Abnormal; Notable for the following components:   Ferritin 335 (*)    All other components within normal limits  LACTIC ACID, PLASMA - Abnormal; Notable for the following components:   Lactic Acid, Venous 2.9 (*)    All other components within normal limits  CULTURE, BLOOD (ROUTINE X 2)  CULTURE, BLOOD (ROUTINE X 2)  URINE CULTURE  APTT  CBC WITH DIFFERENTIAL/PLATELET  LACTIC ACID, PLASMA  PROTIME-INR  CBC  CREATININE, SERUM  PROCALCITONIN  APTT    EKG EKG Interpretation  Date/Time:  Sunday September 01 2020 22:58:09 EDT Ventricular Rate:  118 PR Interval:    QRS Duration: 89 QT Interval:  433 QTC Calculation: 589 R Axis:   98 Text Interpretation: Atrial fibrillation Low voltage, extremity and precordial leads Prolonged QT interval When compared with ECG of 07/06/2020, QT has  lengthened Confirmed by Delora Fuel ([id number]) on 09/02/2020 12:12:19 AM   Radiology DG Chest Port 1 View  Result Date: 08/23/2020 CLINICAL DATA:  New cough for 2 days, possible sepsis, initial encounter EXAM: PORTABLE CHEST 1 VIEW COMPARISON:  07/04/2020 FINDINGS: Cardiac shadow is enlarged but stable. Aortic calcifications are noted. Right-sided pleural effusion is again seen. Bibasilar airspace opacities are noted. No bony abnormality is noted. IMPRESSION: Bibasilar opacity with associated right-sided pleural effusion. The left basilar changes are new from the prior exam. Electronically Signed   By: Inez Catalina M.D.   On: 08/20/2020 22:57    Procedures .Critical Care  Date/Time: 09/02/2020 12:07 AM Performed by: Abigail Butts, PA-C Authorized by: Abigail Butts, PA-C   Critical care provider statement:    Critical care time (minutes):  65   Critical care time was exclusive of:  Separately billable procedures and treating other patients and teaching time   Critical care was necessary  to treat or prevent imminent or life-threatening deterioration of the following conditions:  Sepsis   Critical care was time spent personally by me on the following activities:  Discussions with consultants, evaluation of patient's response to treatment, examination of patient, ordering and performing treatments and interventions, ordering and review of laboratory studies, ordering and review of radiographic studies, pulse oximetry, re-evaluation of patient's condition, obtaining history from patient or surrogate and review of old charts   I assumed direction of critical care for this patient from another provider in my specialty: no     Care discussed with: admitting provider     Medications Ordered in ED Medications  lactated ringers infusion (0 mLs Intravenous Stopped 09/02/20 0459)  amiodarone (PACERONE) tablet 200 mg (has no administration in time range)  atorvastatin (LIPITOR) tablet 10 mg  (has no administration in time range)  metoprolol succinate (TOPROL-XL) 24 hr tablet 100 mg (has no administration in time range)  furosemide (LASIX) tablet 80 mg (has no administration in time range)  linagliptin (TRADJENTA) tablet 5 mg (has no administration in time range)  potassium chloride SA (KLOR-CON) CR tablet 40 mEq (has no administration in time range)  enoxaparin (LOVENOX) injection 30 mg (has no administration in time range)  lactated ringers infusion ( Intravenous Rate/Dose Change 09/02/20 0501)  guaiFENesin-dextromethorphan (ROBITUSSIN DM) 100-10 MG/5ML syrup 10 mL (has no administration in time range)  chlorpheniramine-HYDROcodone (TUSSIONEX) 10-8 MG/5ML suspension 5 mL (has no administration in time range)  ascorbic acid (VITAMIN C) tablet 500 mg (has no administration in time range)  zinc sulfate capsule 220 mg (has no administration in time range)  acetaminophen (TYLENOL) tablet 650 mg (has no administration in time range)  remdesivir 100 mg in sodium chloride 0.9 % 100 mL IVPB (100 mg Intravenous New Bag/Given 09/02/20 0351)    Followed by  remdesivir 100 mg in sodium chloride 0.9 % 100 mL IVPB (100 mg Intravenous New Bag/Given 09/02/20 0504)    Followed by  remdesivir 100 mg in sodium chloride 0.9 % 100 mL IVPB (has no administration in time range)  vancomycin (VANCOCIN) IVPB 1000 mg/200 mL premix (has no administration in time range)  ceFEPIme (MAXIPIME) 2 g in sodium chloride 0.9 % 100 mL IVPB (has no administration in time range)  ceFEPIme (MAXIPIME) 2 g in sodium chloride 0.9 % 100 mL IVPB (0 g Intravenous Stopped 08/23/2020 2356)  metroNIDAZOLE (FLAGYL) IVPB 500 mg (0 mg Intravenous Stopped 09/02/20 0059)  vancomycin (VANCOCIN) IVPB 1000 mg/200 mL premix (1,000 mg Intravenous New Bag/Given 09/02/20 0106)  acetaminophen (TYLENOL) tablet 1,000 mg (1,000 mg Oral Given 09/02/20 0105)    ED Course  I have reviewed the triage vital signs and the nursing notes.  Pertinent labs &  imaging results that were available during my care of the patient were reviewed by me and considered in my medical decision making (see chart for details).  Clinical Course as of 09/02/20 0644  Mon Sep 02, 2020  0003 DG Chest [place] 1 View Bibasilar opacities, left opacity new when compared to prior.  I personally evaluated these images. [HM]  0003 WBC: 6.0 Noted [HM]  0003 Lactic Acid, Venous(!!): 2.6 Elevated [HM]  0003 Creatinine(!): 2.26 Elevated above baseline, however this appears to be acute on chronic [HM]  0004 AST(!): 98 Elevated AST/ALT.  Abdomen is soft and nontender [HM]  0029 SARS Coronavirus 2 by RT PCR(!): POSITIVE Noted [HM]  0029 Hemoglobin(!): 10.0 baseline [HM]  0029 ED EKG 12-Lead A-fib.  Hx of same [  HM]    Clinical Course User Index [HM] Floy Angert, Gwenlyn Perking   MDM Rules/Calculators/A&P                          Patient presents from home via EMS with complaints of cough and rash.  She is hot to touch, tachycardic and tachypneic.  No hypoxia or hypotension.  Febrile on arrival to 101.1 rectally.  Rhonchi in the lower lobes.  Rash to the right arm concerning for possible cellulitis.  Code sepsis initiated along with broad-spectrum antibiotics given concern for possible pneumonia and cellulitis.  12:09 AM Chest x-ray with confirmation of consolidation in the left lower lobe which is new from prior.  I personally evaluated these images.  Patient with acute on chronic renal failure and elevated lactic acid.  Additionally patient with elevated AST and ALT -previously elevated but higher today.  Abdomen is soft and nontender.  She denies nausea, vomiting or diarrhea.  Patient will be admitted for sepsis.  12:08 AM Elevated lactic acid.  Patient without hypotension.  Fluids given but no large-volume fluid resuscitation as patient is not in septic shock and does have a history of CHF.  EKG with A. fib.  Patient has history of same.  12:28 AM Discussed patient's  case with hospitalist  I have recommended admission and patient (and family if present) agree with this plan. Admitting physician will place admission orders.   Additionally, patient's COVID swab resulted as positive.  Suspect this is the etiology of her pneumonia. Unknown vaccination status.   Final Clinical Impression(s) / ED Diagnoses Final diagnoses:  Sepsis with acute renal failure without septic shock, due to unspecified organism, unspecified acute renal failure type [facility])  COVID-19    Rx / DC Orders ED Discharge Orders     None        Zhuri Krass, Gwenlyn Perking 09/02/20 1643    Maudie Flakes, MD 09/02/20 [phone]

## 2020-09-02 NOTE — Progress Notes (Signed)
Per secure chat with bedside RN, unable to get current weight for Sepsis calculations due to Pt too unsteady to stand for weight and on a non-weighing bed.

## 2020-09-02 NOTE — Plan of Care (Signed)
  Problem: Education: Goal: Knowledge of risk factors and measures for prevention of condition will improve Outcome: Progressing   Problem: Coping: Goal: Psychosocial and spiritual needs will be supported Outcome: Progressing   

## 2020-09-02 NOTE — ED Notes (Signed)
ED TO INPATIENT HANDOFF REPORT  Name/Age/Gender Whitney [name] 87 y.o. female  Code Status Code Status History    Date Active Date Inactive Code Status Order ID Comments User Context   07/04/2020 1548 07/06/2020 1722 Full Code [id number]  Norval Morton, MD ED   10/24/2019 0856 10/30/2019 1903 Full Code [id number]  Vianne Bulls, MD Inpatient   10/23/2016 2254 10/26/2016 1619 Full Code [id number]  Etta Quill, DO ED   12/30/2015 2242 01/02/2016 1702 Full Code [id number]  Etta Quill, DO ED   02/24/2014 1633 02/27/2014 1919 Full Code [id number]  Erlene Senters, PA-C Inpatient   02/24/2014 0420 02/24/2014 1633 Full Code [id number]  Etta Quill, DO ED   12/08/2013 2236 12/14/2013 1457 Full Code [id number]  Robbie Lis, MD Inpatient    Questions for Most Recent Historical Code Status (Order [id number])       Home/SNF/Other Home  Chief Complaint Sepsis ([place]) [A41.9]  Level of Care/Admitting Diagnosis ED Disposition    ED Disposition  Admit   Condition  --   Comment  Hospital Area: [facility] [[id number]]  Level of Care: Telemetry [5]  Admit to tele based on following criteria: Complex arrhythmia (Bradycardia/Tachycardia)  May admit patient to [facility] or [facility] if equivalent level of care is available:: Yes  Covid Evaluation: Confirmed COVID Positive  Diagnosis: Sepsis [facility]) [[id number]]  Admitting Physician: Eben Burow [[id number]]  Attending Physician: Eben Burow [[id number]]  Estimated length of stay: past midnight tomorrow  Certification:: I certify this patient will need inpatient services for at least 2 midnights         Medical History Past Medical History:  Diagnosis Date  . Anemia   . Atrial fibrillation ([place])   . Breast cancer ([place]) 10/2013   right upper outer  . Cancer ([place])    right breast  . Complication of anesthesia    slow to wake up  . Diabetes mellitus without complication ([place])    type 2  .  Dysrhythmia 10/15   AF  . Former smoker   . Full dentures   . Hyperlipidemia   . Hypertension   . Multinodular goiter   . Osteoporosis   . Radiation    Right Breast  . Thyroid disease    hypothyroidism  . Wears glasses     Allergies Allergies  Allergen Reactions  . Tape Other (See Comments)    Skin is somewhat sensitive    IV Location/Drains/Wounds Patient Lines/Drains/Airways Status    Active Line/Drains/Airways    Name Placement date Placement time Site Days   Peripheral IV 08/27/2020 20 G Left Antecubital 09/08/2020  2300  Antecubital  1   Peripheral IV 08/17/2020 20 G Left Forearm 09/07/2020  2315  Forearm  1   Closed System Drain 1 Right Chest Bulb (JP) 19 Fr. 01/09/14  1545  Chest  2428   Incision (Closed) 01/09/14 Breast Right 01/09/14  1518  -- 2428   Incision (Closed) 01/09/14 Axilla Right 01/09/14  1550  -- 2428   Incision (Closed) 01/09/14 Chest Right 01/09/14  1607  -- 2428   Incision (Closed) 02/24/14 Leg Left 02/24/14  1435  -- 2382          Labs/Imaging Results for orders placed or performed during the hospital encounter of 08/22/2020 (from the past 48 hour(s))  Resp Panel by RT-PCR (Flu A&B, Covid) Nasopharyngeal Swab     Status: Abnormal   Collection Time: 08/18/2020 10:45 PM  Specimen: Nasopharyngeal Swab; Nasopharyngeal(NP) swabs in vial transport medium  Result Value Ref Range   SARS Coronavirus 2 by RT PCR POSITIVE (A) NEGATIVE    Comment: RESULT CALLED TO, READ BACK BY AND VERIFIED WITH: Mesa Janus SHEPHARD AT 0016 ON 09/02/20 BY MAJ (NOTE) SARS-CoV-2 target nucleic acids are DETECTED.  The SARS-CoV-2 RNA is generally detectable in upper respiratory specimens during the acute phase of infection. Positive results are indicative of the presence of the identified virus, but do not rule out bacterial infection or co-infection with other pathogens not detected by the test. Clinical correlation with patient history and other diagnostic information is necessary  to determine patient infection status. The expected result is Negative.  Fact Sheet for Patients: EntrepreneurPulse.com.au  Fact Sheet for Healthcare Providers: IncredibleEmployment.be  This test is not yet approved or cleared by the [place] FDA and  has been authorized for detection and/or diagnosis of SARS-CoV-2 by FDA under an Emergency Use Authorization (EUA).  This EUA will remain in effect (meaning this tes t can be used) for the duration of  the COVID-19 declaration under Section 564(b)(1) of the Act, 21 U.S.C. section 360bbb-3(b)(1), unless the authorization is terminated or revoked sooner.     Influenza A by PCR NEGATIVE NEGATIVE   Influenza B by PCR NEGATIVE NEGATIVE    Comment: (NOTE) The Xpert Xpress SARS-CoV-2/FLU/RSV plus assay is intended as an aid in the diagnosis of influenza from Nasopharyngeal swab specimens and should not be used as a sole basis for treatment. Nasal washings and aspirates are unacceptable for Xpert Xpress SARS-CoV-2/FLU/RSV testing.  Fact Sheet for Patients: EntrepreneurPulse.com.au  Fact Sheet for Healthcare Providers: IncredibleEmployment.be  This test is not yet approved or cleared by the [place] FDA and has been authorized for detection and/or diagnosis of SARS-CoV-2 by FDA under an Emergency Use Authorization (EUA). This EUA will remain in effect (meaning this test can be used) for the duration of the COVID-19 declaration under Section 564(b)(1) of the Act, 21 U.S.C. section 360bbb-3(b)(1), unless the authorization is terminated or revoked.  Performed at [facility], [place] [address], [place], [place] [address]   Lactic acid, plasma     Status: Abnormal   Collection Time: 09/12/2020 10:45 PM  Result Value Ref Range   Lactic Acid, Venous 2.6 (HH) 0.5 - 1.9 mmol/L    Comment: CRITICAL RESULT CALLED TO, READ BACK BY AND VERIFIED  WITH: Yarexi Pawlicki SHEPHARD AT 2344 ON 08/26/2020 BY MAJ Performed at [facility], [place] [address] [place], [place] [address]   Comprehensive metabolic panel     Status: Abnormal   Collection Time: 09/04/2020 10:45 PM  Result Value Ref Range   Sodium 137 135 - 145 mmol/L   Potassium 4.2 3.5 - 5.1 mmol/L   Chloride 105 98 - 111 mmol/L   CO2 20 (L) 22 - 32 mmol/L   Glucose, Bld 198 (H) 70 - 99 mg/dL    Comment: Glucose reference range applies only to samples taken after fasting for at least 8 hours.   BUN 42 (H) 8 - 23 mg/dL   Creatinine, Ser 2.26 (H) 0.44 - 1.00 mg/dL   Calcium 8.3 (L) 8.9 - 10.3 mg/dL   Total Protein 5.6 (L) 6.5 - 8.1 g/dL   Albumin 3.0 (L) 3.5 - 5.0 g/dL   AST 98 (H) 15 - 41 U/L   ALT 84 (H) 0 - 44 U/L   Alkaline Phosphatase 46 38 - 126 U/L   Total Bilirubin 1.4 (  H) 0.3 - 1.2 mg/dL   GFR, Estimated 20 (L) >60 mL/min    Comment: (NOTE) Calculated using the CKD-EPI Creatinine Equation (2021)    Anion gap 12 5 - 15    Comment: Performed at [facility], [place] [address] [place], [place] [address]  Protime-INR     Status: Abnormal   Collection Time: 09/03/2020 10:45 PM  Result Value Ref Range   Prothrombin Time 15.9 (H) 11.4 - 15.2 seconds   INR 1.3 (H) 0.8 - 1.2    Comment: (NOTE) INR goal varies based on device and disease states. Performed at [facility], [place] [address], [place], [place] [address]   APTT     Status: None   Collection Time: 08/30/2020 10:45 PM  Result Value Ref Range   aPTT 32 24 - 36 seconds    Comment: Performed at [facility], [place] [address], [place], [place] [address]  Urinalysis, Routine w reflex microscopic Urine, Catheterized     Status: Abnormal   Collection Time: 08/21/2020 10:45 PM  Result Value Ref Range   Color, Urine YELLOW YELLOW   APPearance CLEAR CLEAR   Specific Gravity, Urine 1.011 1.005 - 1.030   pH 5.0 5.0 - 8.0   Glucose, UA NEGATIVE NEGATIVE mg/dL    Hgb urine dipstick SMALL (A) NEGATIVE   Bilirubin Urine NEGATIVE NEGATIVE   Ketones, ur NEGATIVE NEGATIVE mg/dL   Protein, ur NEGATIVE NEGATIVE mg/dL   Nitrite NEGATIVE NEGATIVE   Leukocytes,Ua NEGATIVE NEGATIVE   Bacteria, UA RARE (A) NONE SEEN   Mucus PRESENT    Granular Casts, UA PRESENT     Comment: Performed at [facility], [place] [address], [place], [place] [address]  Brain natriuretic peptide     Status: Abnormal   Collection Time: 08/15/2020 10:45 PM  Result Value Ref Range   B Natriuretic Peptide 781.0 (H) 0.0 - 100.0 pg/mL    Comment: Performed at [facility], [place] [address] [place], [place] [address]  CBC with Differential/Platelet     Status: Abnormal   Collection Time: 08/21/2020 11:14 PM  Result Value Ref Range   WBC 6.0 4.0 - 10.5 K/uL   RBC 2.86 (L) 3.87 - 5.11 MIL/uL   Hemoglobin 10.0 (L) 12.0 - 15.0 g/dL   HCT 30.7 (L) 36.0 - 46.0 %   MCV 107.3 (H) 80.0 - 100.0 fL   MCH 35.0 (H) 26.0 - 34.0 pg   MCHC 32.6 30.0 - 36.0 g/dL   RDW 15.6 (H) 11.5 - 15.5 %   Platelets 144 (L) 150 - 400 K/uL   nRBC 0.0 0.0 - 0.2 %   Neutrophils Relative % 88 %   Band Neutrophils 8 %   Lymphocytes Relative 3 %   Monocytes Relative 1 %   Eosinophils Relative 0 %   Basophils Relative 0 %   WBC Morphology NORMAL    RBC Morphology NORMAL    Smear Review DONE    nRBC NONE SEEN 0 /100 WBC   Metamyelocytes Relative NONE SEEN %   Myelocytes NONE SEEN %   Promyelocytes Relative NONE SEEN %   Blasts NONE SEEN %    Comment: Performed at [facility], [place] [address], [place],  [address]   DG Chest Port 1 View  Result Date: 09/09/2020 CLINICAL DATA:  New cough for 2 days, possible sepsis, initial encounter EXAM: PORTABLE CHEST 1 VIEW COMPARISON:  07/04/2020 FINDINGS: Cardiac shadow is enlarged but  stable. Aortic calcifications are noted. Right-sided pleural effusion is again seen. Bibasilar airspace opacities are noted. No bony  abnormality is noted. IMPRESSION: Bibasilar opacity with associated right-sided pleural effusion. The left basilar changes are new from the prior exam. Electronically Signed   By: Inez Catalina M.D.   On: 09/12/2020 22:57    Pending Labs Unresulted Labs (From admission, onward)    Start     Ordered   09/02/20 0024  D-dimer, quantitative  ONCE - STAT,   STAT        09/02/20 0023   09/02/20 0024  C-reactive protein  Once,   STAT        09/02/20 0023   09/02/20 0024  Ferritin  Once,   STAT        09/02/20 0023   08/27/2020 2224  Lactic acid, plasma  (Septic presentation on arrival (screening labs, nursing and treatment orders for obvious sepsis))  Now then every 2 hours,   STAT      08/26/2020 2226   09/08/2020 2224  CBC WITH DIFFERENTIAL  (Septic presentation on arrival (screening labs, nursing and treatment orders for obvious sepsis))  ONCE - STAT,   STAT        08/29/2020 2226   08/29/2020 2224  Blood Culture (routine x 2)  (Septic presentation on arrival (screening labs, nursing and treatment orders for obvious sepsis))  BLOOD CULTURE X 2,   STAT      08/24/2020 2226   09/12/2020 2224  Urine culture  (Septic presentation on arrival (screening labs, nursing and treatment orders for obvious sepsis))  ONCE - STAT,   STAT        09/10/2020 2226   Signed and Held  Lactic acid, plasma  STAT Now then every 2 hours,   STAT      Signed and Held   Signed and Held  Protime-INR  ONCE - STAT,   R        Signed and Held   Signed and Held  CBC  (enoxaparin (LOVENOX)    CrCl >/= 30 ml/min)  Once,   R       Comments: Baseline for enoxaparin therapy IF NOT ALREADY DRAWN.  Notify MD if PLT < 100 K.    Signed and Held   Signed and Held  Creatinine, serum  (enoxaparin (LOVENOX)    CrCl >/= 30 ml/min)  Once,   R       Comments: Baseline for enoxaparin therapy IF NOT ALREADY DRAWN.    Signed and Held   Signed and Held  Creatinine, serum  (enoxaparin (LOVENOX)    CrCl >/= 30 ml/min)  Weekly,   R     Comments: while on  enoxaparin therapy    Signed and Held   Signed and Held  Procalcitonin  Once,   R        Signed and Held   Signed and Held  CBC with Differential/Platelet  Daily,   R      Signed and Held   Signed and Held  Comprehensive metabolic panel  Daily,   R      Signed and Held   Signed and Held  C-reactive protein  Daily,   R      Signed and Held   Signed and Held  Ferritin  Daily,   R      Signed and Held   Signed and Held  APTT  ONCE - STAT,   R  Signed and Held          Vitals/Pain Today's Vitals   09/07/2020 2330 08/31/2020 2345 09/02/20 0000 09/02/20 0030  BP: 106/64 121/88 120/89 (!) 119/94  Pulse: 82 (!) 108 (!) 105 100  Resp: 19 18 18 19   Temp:      TempSrc:      SpO2: 97% 96% 98% 97%    Isolation Precautions No active isolations  Medications Medications  lactated ringers infusion ( Intravenous New Bag/Given 09/10/2020 2321)  vancomycin (VANCOCIN) IVPB 1000 mg/200 mL premix (1,000 mg Intravenous New Bag/Given 09/02/20 0106)  remdesivir 100 mg in sodium chloride 0.9 % 100 mL IVPB (has no administration in time range)    Followed by  remdesivir 100 mg in sodium chloride 0.9 % 100 mL IVPB (has no administration in time range)    Followed by  remdesivir 100 mg in sodium chloride 0.9 % 100 mL IVPB (has no administration in time range)  vancomycin (VANCOCIN) IVPB 1000 mg/200 mL premix (has no administration in time range)  ceFEPIme (MAXIPIME) 2 g in sodium chloride 0.9 % 100 mL IVPB (0 g Intravenous Stopped 09/10/2020 2356)  metroNIDAZOLE (FLAGYL) IVPB 500 mg (0 mg Intravenous Stopped 09/02/20 0059)  acetaminophen (TYLENOL) tablet 1,000 mg (1,000 mg Oral Given 09/02/20 0105)    Mobility Normally walks with cane, weakness the last 2 days

## 2020-09-03 ENCOUNTER — Other Ambulatory Visit: Payer: Self-pay

## 2020-09-03 ENCOUNTER — Inpatient Hospital Stay (HOSPITAL_COMMUNITY): Payer: Medicare Other

## 2020-09-03 DIAGNOSIS — R7881 Bacteremia: Secondary | ICD-10-CM | POA: Diagnosis not present

## 2020-09-03 DIAGNOSIS — U071 COVID-19: Secondary | ICD-10-CM

## 2020-09-03 DIAGNOSIS — L03113 Cellulitis of right upper limb: Secondary | ICD-10-CM

## 2020-09-03 DIAGNOSIS — A4101 Sepsis due to Methicillin susceptible Staphylococcus aureus: Secondary | ICD-10-CM

## 2020-09-03 LAB — DIFFERENTIAL
Abs Immature Granulocytes: 0.03 10*3/uL (ref 0.00–0.07)
Basophils Absolute: 0 10*3/uL (ref 0.0–0.1)
Basophils Relative: 0 %
Eosinophils Absolute: 0 10*3/uL (ref 0.0–0.5)
Eosinophils Relative: 0 %
Immature Granulocytes: 0 %
Lymphocytes Relative: 3 %
Lymphs Abs: 0.2 10*3/uL — ABNORMAL LOW (ref 0.7–4.0)
Monocytes Absolute: 0.3 10*3/uL (ref 0.1–1.0)
Monocytes Relative: 4 %
Neutro Abs: 7.2 10*3/uL (ref 1.7–7.7)
Neutrophils Relative %: 93 %

## 2020-09-03 LAB — CBC
HCT: 33.5 % — ABNORMAL LOW (ref 36.0–46.0)
Hemoglobin: 10.7 g/dL — ABNORMAL LOW (ref 12.0–15.0)
MCH: 34.7 pg — ABNORMAL HIGH (ref 26.0–34.0)
MCHC: 31.9 g/dL (ref 30.0–36.0)
MCV: 108.8 fL — ABNORMAL HIGH (ref 80.0–100.0)
Platelets: 166 10*3/uL (ref 150–400)
RBC: 3.08 MIL/uL — ABNORMAL LOW (ref 3.87–5.11)
RDW: 16.1 % — ABNORMAL HIGH (ref 11.5–15.5)
WBC: 7.7 10*3/uL (ref 4.0–10.5)
nRBC: 0 % (ref 0.0–0.2)

## 2020-09-03 LAB — COMPREHENSIVE METABOLIC PANEL
ALT: 86 U/L — ABNORMAL HIGH (ref 0–44)
AST: 117 U/L — ABNORMAL HIGH (ref 15–41)
Albumin: 2.6 g/dL — ABNORMAL LOW (ref 3.5–5.0)
Alkaline Phosphatase: 39 U/L (ref 38–126)
Anion gap: 7 (ref 5–15)
BUN: 43 mg/dL — ABNORMAL HIGH (ref 8–23)
CO2: 20 mmol/L — ABNORMAL LOW (ref 22–32)
Calcium: 8 mg/dL — ABNORMAL LOW (ref 8.9–10.3)
Chloride: 111 mmol/L (ref 98–111)
Creatinine, Ser: 2.29 mg/dL — ABNORMAL HIGH (ref 0.44–1.00)
GFR, Estimated: 20 mL/min — ABNORMAL LOW (ref >60)
Glucose, Bld: 173 mg/dL — ABNORMAL HIGH (ref 70–99)
Potassium: 5.1 mmol/L (ref 3.5–5.1)
Sodium: 138 mmol/L (ref 135–145)
Total Bilirubin: 1.2 mg/dL (ref 0.3–1.2)
Total Protein: 5.1 g/dL — ABNORMAL LOW (ref 6.5–8.1)

## 2020-09-03 LAB — ECHOCARDIOGRAM COMPLETE
Calc EF: 52.8 %
Height: 66 in
P 1/2 time: 699 msec
S' Lateral: 2.7 cm
Single Plane A2C EF: 55 %
Single Plane A4C EF: 47.3 %
Weight: 2465.62 oz

## 2020-09-03 LAB — GLUCOSE, CAPILLARY
Glucose-Capillary: 148 mg/dL — ABNORMAL HIGH (ref 70–99)
Glucose-Capillary: 193 mg/dL — ABNORMAL HIGH (ref 70–99)
Glucose-Capillary: 190 mg/dL — ABNORMAL HIGH (ref 70–99)
Glucose-Capillary: 241 mg/dL — ABNORMAL HIGH (ref 70–99)

## 2020-09-03 LAB — C-REACTIVE PROTEIN: CRP: 4.3 mg/dL — ABNORMAL HIGH (ref <1.0)

## 2020-09-03 LAB — URINE CULTURE: Culture: NO GROWTH

## 2020-09-03 LAB — FERRITIN: Ferritin: 404 ng/mL — ABNORMAL HIGH (ref 11–307)

## 2020-09-03 LAB — PROCALCITONIN: Procalcitonin: 0.36 ng/mL

## 2020-09-03 MED ORDER — SODIUM CHLORIDE 0.9 % IV SOLN: INTRAVENOUS | Status: DC

## 2020-09-03 MED ORDER — CEFAZOLIN SODIUM-DEXTROSE 2-4 GM/100ML-% IV SOLN
2.0000 g | Freq: Two times a day (BID) | INTRAVENOUS | Status: DC
  Administered 2020-09-03 – 2020-09-06 (×6): 2 g via INTRAVENOUS
  Filled 2020-09-03 (×9): qty 100

## 2020-09-03 NOTE — Progress Notes (Signed)
  Echocardiogram 2D Echocardiogram has been performed.  Fidel Levy 09/03/2020, 3:02 PM

## 2020-09-03 NOTE — Evaluation (Signed)
Physical Therapy Evaluation Patient Details Name: Whitney [name] MRN: [id number] DOB: [birth year] Today's Date: 09/03/2020   History of Present Illness  87 year old female with history of paroxysmal A. fib, diabetes mellitus type 2, hypertension, hyperlipidemia, chronic diastolic CHF presented with fever and weakness.  On presentation, she was found to be febrile, tachycardic with lactic acid of 2.6, creatinine of 2.26.  Chest x-ray showed bibasilar opacities with small right pleural effusion.  She tested positive for COVID.  She was found to have right upper extremity cellulitis.  She was started on IV fluids, antibiotics, remdesivir and steroids.  She was subsequently found to have MSSA bacteremia.  Clinical Impression  Pt admitted with above diagnosis. Pt currently with functional limitations due to the deficits listed below (see PT Problem List). Pt will benefit from skilled PT to increase their independence and safety with mobility to allow discharge to the venue listed below.  Pt assisted to bathroom per request however fatigued and reported increased weakness after assisting pt with pericare in standing, so pt assisted safely to sitting again on toilet.  Nurse tech provided +2 and stedy as pt unable to stand without increased assist or ambulate back to bed.  Recommend SNF upon d/c at this time.      Follow Up Recommendations SNF    Equipment Recommendations  None recommended by PT    Recommendations for Other Services       Precautions / Restrictions Precautions Precautions: Fall      Mobility  Bed Mobility Overal bed mobility: Needs Assistance Bed Mobility: Supine to Sit     Supine to sit: Mod assist     General bed mobility comments: assist to raise trunk    Transfers Overall transfer level: Needs assistance Equipment used: Rolling walker (2 wheeled) Transfers: Sit to/from Stand Sit to Stand: Max assist;From elevated surface         General transfer comment:  pt mod assist from elevated surface from bed and then once from toilet however fatigued quickly and returned to sitting on the toilet; unable to assist with standing without +2 and utilized stedy as pt unable to ambulate back to bed  Ambulation/Gait Ambulation/Gait assistance: Min assist Gait Distance (Feet): 10 Feet Assistive device: Rolling walker (2 wheeled) Gait Pattern/deviations: Step-through pattern;Decreased stride length;Trunk flexed     General Gait Details: pt ambulated to bathroom with RW, assist for steadying and support as pt reports mild weakness; after using bathroom, pt required assist for hygiene in standing and then required assist to return to toilet and control descent due to weakness and fatigue, required stedy to get back to bed  Stairs            Wheelchair Mobility    Modified Rankin (Stroke Patients Only)       Balance Overall balance assessment: History of Falls                                           Pertinent Vitals/Pain Pain Assessment: No/denies pain    Home Living Family/patient expects to be discharged to:: Private residence Living Arrangements: Other relatives (granddaughter) Available Help at Discharge: Family;Available 24 hours/day Type of Home: House Home Access: Level entry     Home Layout: One level Home Equipment: Cane - single point;Cane - quad;Bedside commode;Shower seat;Walker - 2 wheels      Prior Function Level of Independence: Independent with  assistive device(s)         Comments: doesn't drive, independent bathing/dressing, walks with cane     Hand Dominance        Extremity/Trunk Assessment   Upper Extremity Assessment Upper Extremity Assessment: Generalized weakness    Lower Extremity Assessment Lower Extremity Assessment: Generalized weakness       Communication   Communication: HOH  Cognition Arousal/Alertness: Awake/alert Behavior During Therapy: WFL for tasks  assessed/performed Overall Cognitive Status: Within Functional Limits for tasks assessed                                        General Comments      Exercises     Assessment/Plan    PT Assessment Patient needs continued PT services  PT Problem List Decreased strength;Decreased mobility;Decreased knowledge of use of DME;Decreased balance;Decreased activity tolerance;Decreased range of motion       PT Treatment Interventions DME instruction;Gait training;Balance training;Therapeutic exercise;Functional mobility training;Therapeutic activities;Patient/family education    PT Goals (Current goals can be found in the Care Plan section)  Acute Rehab PT Goals PT Goal Formulation: With patient Time For Goal Achievement: 09/17/20 Potential to Achieve Goals: Good    Frequency Min 2X/week   Barriers to discharge        Co-evaluation               AM-PAC PT "6 Clicks" Mobility  Outcome Measure Help needed turning from your back to your side while in a flat bed without using bedrails?: A Lot Help needed moving from lying on your back to sitting on the side of a flat bed without using bedrails?: A Lot Help needed moving to and from a bed to a chair (including a wheelchair)?: A Lot Help needed standing up from a chair using your arms (e.g., wheelchair or bedside chair)?: Total Help needed to walk in hospital room?: Total Help needed climbing 3-5 steps with a railing? : Total 6 Click Score: 9    End of Session Equipment Utilized During Treatment: Gait belt Activity Tolerance: Patient limited by fatigue Patient left: in bed;with call bell/phone within reach Nurse Communication: Mobility status;Need for lift equipment (nurse tech brought in stedy) PT Visit Diagnosis: Other abnormalities of gait and mobility (R26.89)    Time: 5537-4827 PT Time Calculation (min) (ACUTE ONLY): 44 min   Charges:   PT Evaluation $PT Eval Moderate Complexity: 1 Mod PT  Treatments $Therapeutic Activity: 8-22 mins   Jannette Spanner PT, DPT Acute Rehabilitation Services Pager: [phone] Office: [phone]   Whitney [name] E 09/03/2020, 1:03 PM

## 2020-09-03 NOTE — Consult Note (Signed)
[facility] for Infectious Diseases                                                                                        Patient Identification: Patient Name: Whitney [name] MRN: [id number] [place] Date: 08/16/2020 10:11 PM Today's Date: 09/03/2020 Reason for consult: Staph aureus bacteremia Requesting provider: CHAMP autoconsult  Principal Problem:   Sepsis ([place]) Active Problems:   DM2 (diabetes mellitus, type 2) ([place])   Essential hypertension, benign   Chronic diastolic CHF (congestive heart failure), NYHA class 2 (HCC)   PAF (paroxysmal atrial fibrillation) (HCC)   Prolonged QT interval   Pneumonia due to COVID-19 virus   Cellulitis of right arm   Acute kidney injury superimposed on CKD ([place])   Antibiotics: Vancomycin/cefepime/metronidazole 6/19-current  Lines/Tubes: PIV's  Assessment MSSA bacteremia setting of right upper extremity cellulitis Denies any back pain, peripheral joint pain/swelling  COVID-19 pneumonia Bibasilar opacity associated with Rt pleural effusion -Denies any respiratory symptoms -On COVID isolation precautions  Left TKA -denies any issues currently. No pain erythema swelling or tenderness on exam Left Intertrochanteric Fracture with IM nail   Transaminitis -in the setting of sepsis AKI -in the setting of sepsis  Recommendations  Continue cefazolin as is Repeat blood cultures 2 sets on 6/22 TTE.  If TTE is negative will need TEE to rule out endocarditis Right upper extremity ultrasound for right upper arm swelling and erythema Acute hepatitis panel COVID-19 pneumonia management per hospitalist COVID isolation precautions per IP Following   Rest of the management as per the primary team. Please call with questions or concerns.  Thank you for the consult  Rosiland Oz, MD Infectious Disease Physician [facility] for Infectious Disease [address] [place], [place] [address] Phone: [phone]  Fax: [phone]  __________________________________________________________________________________________________________ HPI and Hospital Course: 87 year old female with past medical history of PAF , DM type II , hypertension , hyperlipidemia , diastolic CHF who presented to the ED on 6/20 with complaints of fever , generalized weakness nonproductive cough and rash in the right arm for 2 days.  Per family she has been getting weaker over the last several days and had a fall 1 week ago leading to skin tear in her left arm.  Patient lives with her daughter at home with her caregiver. Patient is hard of hearing. Denies any headache neck pain, back pain, chest pain shortness of breath abdominal pain nausea vomiting or diarrhea.  She denies any GU symptoms. She complains of pain and swelling in the rt arm. She has been vaccinated for COVID including booster.  At ED, she was febrile with T-max 101.1, no leukocytosis.  Labs remarkable for AKI with creatinine of 2.01, AST 83, ALT 72, T bili 0.8.  Chest x-ray with bibasilar opacity with associated right pleural effusion.  The left basilar changes are new from the prior exam.  ROS- denies back pain, pain in peripheral joints. Denies nausea, vomiting and diarrhea Denies chest pain, SOB and cough   Past Medical History:  Diagnosis Date   Anemia    Atrial fibrillation ([place])    Breast cancer ([place])  10/2013   right upper outer   Cancer ([place])    right breast   Complication of anesthesia    slow to wake up   Diabetes mellitus without complication ([place])    type 2   Dysrhythmia 10/15   AF   Former smoker    Full dentures    Hyperlipidemia    Hypertension    Multinodular goiter    Osteoporosis    Radiation    Right Breast   Thyroid disease    hypothyroidism   Wears glasses    Past Surgical History:  Procedure Laterality Date   ABDOMINAL HYSTERECTOMY     AXILLARY LYMPH NODE  DISSECTION Right 01/09/2014   Procedure: RIGHT AXILLARY LYMPH NODE DISECTION;  Surgeon: [name], MD;  Location: [place];  Service: General;  Laterality: Right;   BIOPSY  10/26/2019   Procedure: BIOPSY;  Surgeon: Irving Copas., MD;  Location: [facility];  Service: Gastroenterology;;   BIOPSY  03/11/2020   Procedure: BIOPSY;  Surgeon: Irving Copas., MD;  Location: [facility]COPY;  Service: Gastroenterology;;   BREAST LUMPECTOMY WITH RADIOACTIVE SEED LOCALIZATION Right 01/09/2014   Procedure: RIGHT BREAST SEED LOCALIZED LUMPECTOMY ;  Surgeon: [name], MD;  Location: [place];  Service: General;  Laterality: Right;   COLONOSCOPY WITH PROPOFOL N/A 10/26/2019   Procedure: COLONOSCOPY WITH PROPOFOL;  Surgeon: Irving Copas., MD;  Location: [facility];  Service: Gastroenterology;  Laterality: N/A;   COLONOSCOPY WITH PROPOFOL N/A 03/11/2020   Procedure: COLONOSCOPY WITH PROPOFOL- Ultraslim scope;  Surgeon: Irving Copas., MD;  Location: [place];  Service: Gastroenterology;  Laterality: N/A;   ENDOSCOPIC MUCOSAL RESECTION N/A 03/11/2020   Procedure: ENDOSCOPIC MUCOSAL RESECTION;  Surgeon: Rush Landmark Telford Nab., MD;  Location: [place];  Service: Gastroenterology;  Laterality: N/A;   ESOPHAGOGASTRODUODENOSCOPY (EGD) WITH PROPOFOL N/A 10/26/2019   Procedure: ESOPHAGOGASTRODUODENOSCOPY (EGD) WITH PROPOFOL;  Surgeon: Rush Landmark Telford Nab., MD;  Location: [facility];  Service: Gastroenterology;  Laterality: N/A;   EYE SURGERY  12/22/2009   cataracts   HEMOSTASIS CLIP PLACEMENT  10/26/2019   Procedure: HEMOSTASIS CLIP PLACEMENT;  Surgeon: Irving Copas., MD;  Location: [facility];  Service: Gastroenterology;;  marking for transverse lesion   INTRAMEDULLARY (IM) NAIL INTERTROCHANTERIC Left 02/24/2014   Procedure: IM ROD LEFT HIP FX;  Surgeon: Alta Corning, MD;  Location: [place];  Service: Orthopedics;   Laterality: Left;   left distal radius fracture     2020   POLYPECTOMY  10/26/2019   Procedure: POLYPECTOMY;  Surgeon: Mansouraty, Telford Nab., MD;  Location: [facility];  Service: Gastroenterology;;   POLYPECTOMY  03/11/2020   Procedure: POLYPECTOMY;  Surgeon: Irving Copas., MD;  Location: [place];  Service: Gastroenterology;;   [facility] REMOVAL Right 01/09/2014   Procedure: REMOVAL PORT-A-CATH;  Surgeon: [name], MD;  Location: [place];  Service: General;  Laterality: Right;   PORTACATH PLACEMENT N/A 11/15/2013   Procedure: INSERTION PORT-A-CATH WITH ULTRA SOUND AND FLOROSCOPY;  Surgeon: [name], MD;  Location: [place];  Service: General;  Laterality: N/A;   SUBMUCOSAL TATTOO INJECTION  10/26/2019   Procedure: SUBMUCOSAL TATTOO INJECTION;  Surgeon: Irving Copas., MD;  Location: [facility];  Service: Gastroenterology;;   TOTAL KNEE ARTHROPLASTY  2002   left   No current facility-administered medications on file prior to encounter.   Current Outpatient Medications on File Prior to Encounter  Medication Sig Dispense Refill   amiodarone (PACERONE) 200 MG tablet Take  1 tablet (200 mg total) by mouth daily. 90 tablet 3   atorvastatin (LIPITOR) 10 MG tablet Take 1 tablet (10 mg total) by mouth daily. 90 tablet 1   cholecalciferol (VITAMIN D3) 25 MCG (1000 UNIT) tablet TAKE 1 TAB BY MOUTH EVERY DAY (Patient taking differently: Take 1,000 Units by mouth daily.) 100 tablet 1   docusate sodium (COLACE) 100 MG capsule Take 100 mg by mouth daily.     doxazosin (CARDURA) 4 MG tablet Take 0.5 tablets (2 mg total) by mouth daily.     ferrous sulfate 324 (65 Fe) MG TBEC TAKE 1 TABLET BY MOUTH IN THE MORNING AND 1 AT BEDTIME (Patient taking differently: Take 324 mg by mouth 2 (two) times daily.) 180 tablet 1   furosemide (LASIX) 40 MG tablet TAKE 2 TABLETS BY MOUTH EVERY DAY (Patient taking differently: Take 80 mg by mouth daily.) 180 tablet 1    JANUVIA 100 MG tablet TAKE 0.5 TAB BY MOUTH DAILY (Patient taking differently: Take 50 mg by mouth daily.) 45 tablet 3   meclizine (ANTIVERT) 12.5 MG tablet TAKE 1 TABLET BY MOUTH DAILY AS NEEDED FOR DIZZINESS (Patient taking differently: Take 12.5 mg by mouth as needed for dizziness.) 30 tablet 2   metoprolol succinate (TOPROL-XL) 100 MG 24 hr tablet TAKE 1 TABLET BY MOUTH TWICE A DAY WITH OR IMMEDIATELY FOLLOWING A MEAL (Patient taking differently: Take 100 mg by mouth 2 (two) times daily. WITH OR IMMEDIATELY FOLLOWING A MEAL) 180 tablet 1   polyethylene glycol (MIRALAX / GLYCOLAX) 17 g packet Take 17 g by mouth daily. Also available OTC (Patient taking differently: Take 17 g by mouth as needed for mild constipation.) 30 each 0   potassium chloride SA (KLOR-CON M20) 20 MEQ tablet Take 2 tablets (40 mEq total) by mouth daily. 180 tablet 1   tamoxifen (NOLVADEX) 20 MG tablet TAKE 1 TABLET BY MOUTH EVERY DAY (Patient taking differently: Take 20 mg by mouth daily.) 90 tablet 3   Blood Glucose Monitoring Suppl (ONETOUCH VERIO) w/Device KIT Use to test blood sugars daily. 1 kit 0   metolazone (ZAROXOLYN) 5 MG tablet Take 1 tablet (5 mg total) by mouth once for 1 dose. Take 30 minutes before her morning furosemide (one time) (Patient not taking: Reported on 09/02/2020) 1 tablet 0   ONETOUCH VERIO test strip CHECK BLOOD SUGAR EVERY DAY AS DIRECTED 100 strip 2   Allergies  Allergen Reactions   Tape Other (See Comments)    Skin is somewhat sensitive   Social History   Socioeconomic History   Marital status: Widowed    Spouse name: Not on file   Number of children: 4   Years of education: Not on file   Highest education level: Not on file  Occupational History   Not on file  Tobacco Use   Smoking status: Former    Packs/day: 1.00    Years: 5.00    Pack years: 5.00    Types: Cigarettes    Quit date: 11/14/1958    Years since quitting: 61.8   Smokeless tobacco: Never  Vaping Use   Vaping Use:  Never used  Substance and Sexual Activity   Alcohol use: No    Alcohol/week: 0.0 standard drinks   Drug use: No   Sexual activity: Not Currently    Birth control/protection: Post-menopausal    Comment: menarche age 38, P34, first birth age 85, no HRT, menopause age 70  Other Topics Concern   Not on file  Social History Narrative   Not on file   Social Determinants of Health   Financial Resource Strain: Low Risk    Difficulty of Paying Living Expenses: Not very hard  Food Insecurity: Not on file  Transportation Needs: Not on file  Physical Activity: Not on file  Stress: Not on file  Social Connections: Not on file  Intimate Partner Violence: Not on file     Scheduled Meds:  amiodarone  200 mg Oral Daily   vitamin C  500 mg Oral Daily   atorvastatin  10 mg Oral Daily   dexamethasone (DECADRON) injection  6 mg Intravenous Daily   enoxaparin (LOVENOX) injection  30 mg Subcutaneous Q24H   linagliptin  5 mg Oral Daily   metoprolol succinate  100 mg Oral BID   potassium chloride SA  40 mEq Oral Daily   zinc sulfate  220 mg Oral Daily   Continuous Infusions:   ceFAZolin (ANCEF) IV     remdesivir 100 mg in NS 100 mL     PRN Meds:.acetaminophen, chlorpheniramine-HYDROcodone, guaiFENesin-dextromethorphan  Allergies  Allergen Reactions   Tape Other (See Comments)    Skin is somewhat sensitive   Social History   Socioeconomic History   Marital status: Widowed    Spouse name: Not on file   Number of children: 4   Years of education: Not on file   Highest education level: Not on file  Occupational History   Not on file  Tobacco Use   Smoking status: Former    Packs/day: 1.00    Years: 5.00    Pack years: 5.00    Types: Cigarettes    Quit date: 11/14/1958    Years since quitting: 61.8   Smokeless tobacco: Never  Vaping Use   Vaping Use: Never used  Substance and Sexual Activity   Alcohol use: No    Alcohol/week: 0.0 standard drinks   Drug use: No   Sexual  activity: Not Currently    Birth control/protection: Post-menopausal    Comment: menarche age 40, P65, first birth age 71, no HRT, menopause age 83  Other Topics Concern   Not on file  Social History Narrative   Not on file   Social Determinants of Health   Financial Resource Strain: Low Risk    Difficulty of Paying Living Expenses: Not very hard  Food Insecurity: Not on file  Transportation Needs: Not on file  Physical Activity: Not on file  Stress: Not on file  Social Connections: Not on file  Intimate Partner Violence: Not on file    Vitals BP (!) 180/77 (BP Location: Left Arm)   Pulse 93   Temp 97.7 F (36.5 C) (Oral)   Resp 16   Ht 5' 6"  (1.676 m)   Wt 69.9 kg   LMP  (LMP Unknown)   SpO2 100%   BMI 24.87 kg/m    Physical Exam Constitutional: Hard of hearing, sitting up in bed with no discomfort    Comments:   Cardiovascular:     Rate and Rhythm: Normal rate and regular rhythm.     Heart sounds:    Pulmonary:     Effort: Pulmonary effort is normal.     Comments: Occasional crackles, bilateral decreased air entry at bases  Abdominal:     Palpations: Abdomen is soft.     Tenderness: Nontender and nondistended  Musculoskeletal:        General: No swelling or tenderness.   Skin:    Comments: No lesions or  rashes.  Right upper extremity is slightly erythematous and warm to touch  Neurological:     General: No focal deficit present.   Psychiatric:        Mood and Affect: Mood normal.    Pertinent Microbiology Results for orders placed or performed during the hospital encounter of 08/27/2020  Resp Panel by RT-PCR (Flu A&B, Covid) Nasopharyngeal Swab     Status: Abnormal   Collection Time: 09/03/2020 10:45 PM   Specimen: Nasopharyngeal Swab; Nasopharyngeal(NP) swabs in vial transport medium  Result Value Ref Range Status   SARS Coronavirus 2 by RT PCR POSITIVE (A) NEGATIVE Final    Comment: RESULT CALLED TO, READ BACK BY AND VERIFIED WITH: HALEY SHEPHARD  AT 0016 ON 09/02/20 BY MAJ (NOTE) SARS-CoV-2 target nucleic acids are DETECTED.  The SARS-CoV-2 RNA is generally detectable in upper respiratory specimens during the acute phase of infection. Positive results are indicative of the presence of the identified virus, but do not rule out bacterial infection or co-infection with other pathogens not detected by the test. Clinical correlation with patient history and other diagnostic information is necessary to determine patient infection status. The expected result is Negative.  Fact Sheet for Patients: EntrepreneurPulse.com.au  Fact Sheet for Healthcare Providers: IncredibleEmployment.be  This test is not yet approved or cleared by the [place] FDA and  has been authorized for detection and/or diagnosis of SARS-CoV-2 by FDA under an Emergency Use Authorization (EUA).  This EUA will remain in effect (meaning this tes t can be used) for the duration of  the COVID-19 declaration under Section 564(b)(1) of the Act, 21 U.S.C. section 360bbb-3(b)(1), unless the authorization is terminated or revoked sooner.     Influenza A by PCR NEGATIVE NEGATIVE Final   Influenza B by PCR NEGATIVE NEGATIVE Final    Comment: (NOTE) The Xpert Xpress SARS-CoV-2/FLU/RSV plus assay is intended as an aid in the diagnosis of influenza from Nasopharyngeal swab specimens and should not be used as a sole basis for treatment. Nasal washings and aspirates are unacceptable for Xpert Xpress SARS-CoV-2/FLU/RSV testing.  Fact Sheet for Patients: EntrepreneurPulse.com.au  Fact Sheet for Healthcare Providers: IncredibleEmployment.be  This test is not yet approved or cleared by the [place] FDA and has been authorized for detection and/or diagnosis of SARS-CoV-2 by FDA under an Emergency Use Authorization (EUA). This EUA will remain in effect (meaning this test can be used) for the  duration of the COVID-19 declaration under Section 564(b)(1) of the Act, 21 U.S.C. section 360bbb-3(b)(1), unless the authorization is terminated or revoked.  Performed at [facility], [place] [address] [place], [place] [address]   Blood Culture (routine x 2)     Status: None (Preliminary result)   Collection Time: 08/22/2020 10:45 PM   Specimen: BLOOD  Result Value Ref Range Status   Specimen Description   Final    BLOOD LEFT ANTECUBITAL Performed at [facility] [address], [place], [place] [address]    Special Requests   Final    BOTTLES DRAWN AEROBIC AND ANAEROBIC Blood Culture adequate volume Performed at [facility] [address], [place], [place] [address]    Culture  Setup Time   Final    GRAM POSITIVE COCCI IN BOTH AEROBIC AND ANAEROBIC BOTTLES Organism ID to follow CRITICAL RESULT CALLED TO, READ BACK BY AND VERIFIED WITH: PHARMD LYDIA CHEN AT 2147 ON 09/02/20 BY KJ Performed at [facility], [place] [address] [place],  [address]  Culture GRAM POSITIVE COCCI  Final   Report Status PENDING  Incomplete  Blood Culture ID Panel (Reflexed)     Status: Abnormal   Collection Time: 08/21/2020 10:45 PM  Result Value Ref Range Status   Enterococcus faecalis NOT DETECTED NOT DETECTED Final   Enterococcus Faecium NOT DETECTED NOT DETECTED Final   Listeria monocytogenes NOT DETECTED NOT DETECTED Final   Staphylococcus species DETECTED (A) NOT DETECTED Final    Comment: CRITICAL RESULT CALLED TO, READ BACK BY AND VERIFIED WITH: PHARMD LYDIA CHEN AT 2147 ON 09/02/20 BY KJ      Staphylococcus aureus (BCID) DETECTED (A) NOT DETECTED Final    Comment: CRITICAL RESULT CALLED TO, READ BACK BY AND VERIFIED WITH: PHARMD LYDIA CHEN AT 2147 ON 09/02/20 BY KJ      Staphylococcus epidermidis NOT DETECTED NOT DETECTED Final   Staphylococcus lugdunensis NOT DETECTED NOT DETECTED Final   Streptococcus species NOT  DETECTED NOT DETECTED Final   Streptococcus agalactiae NOT DETECTED NOT DETECTED Final   Streptococcus pneumoniae NOT DETECTED NOT DETECTED Final   Streptococcus pyogenes NOT DETECTED NOT DETECTED Final   A.calcoaceticus-baumannii NOT DETECTED NOT DETECTED Final   Bacteroides fragilis NOT DETECTED NOT DETECTED Final   Enterobacterales NOT DETECTED NOT DETECTED Final   Enterobacter cloacae complex NOT DETECTED NOT DETECTED Final   Escherichia coli NOT DETECTED NOT DETECTED Final   Klebsiella aerogenes NOT DETECTED NOT DETECTED Final   Klebsiella oxytoca NOT DETECTED NOT DETECTED Final   Klebsiella pneumoniae NOT DETECTED NOT DETECTED Final   Proteus species NOT DETECTED NOT DETECTED Final   Salmonella species NOT DETECTED NOT DETECTED Final   Serratia marcescens NOT DETECTED NOT DETECTED Final   Haemophilus influenzae NOT DETECTED NOT DETECTED Final   Neisseria meningitidis NOT DETECTED NOT DETECTED Final   Pseudomonas aeruginosa NOT DETECTED NOT DETECTED Final   Stenotrophomonas maltophilia NOT DETECTED NOT DETECTED Final   Candida albicans NOT DETECTED NOT DETECTED Final   Candida auris NOT DETECTED NOT DETECTED Final   Candida glabrata NOT DETECTED NOT DETECTED Final   Candida krusei NOT DETECTED NOT DETECTED Final   Candida parapsilosis NOT DETECTED NOT DETECTED Final   Candida tropicalis NOT DETECTED NOT DETECTED Final   Cryptococcus neoformans/gattii NOT DETECTED NOT DETECTED Final   Meth resistant mecA/C and MREJ NOT DETECTED NOT DETECTED Final    Comment: Performed at [facility], [address] [place], [place] [address]  MRSA PCR Screening     Status: None   Collection Time: 09/02/20 11:16 AM  Result Value Ref Range Status   MRSA by PCR NEGATIVE NEGATIVE Final    Comment:        The GeneXpert MRSA Assay (FDA approved for NASAL specimens only), is one component of a comprehensive MRSA colonization surveillance program. It is not intended to diagnose  MRSA infection nor to guide or monitor treatment for MRSA infections. Performed at [facility], [place] [address], [place], [place] [address]    Pertinent Lab seen by me: CBC Latest Ref Rng & Units 09/02/2020 08/22/2020 07/31/2020  WBC 4.0 - 10.5 K/uL 6.8 6.0 4.0  Hemoglobin 12.0 - 15.0 g/dL 10.0(L) 10.0(L) 9.3(L)  Hematocrit 36.0 - 46.0 % 29.6(L) 30.7(L) 29.0(L)  Platelets 150 - 400 K/uL 118(L) 144(L) 166   CMP Latest Ref Rng & Units 09/03/2020 09/02/2020 09/02/2020  Glucose 70 - 99 mg/dL 173(H) 162(H) -  BUN 8 - 23 mg/dL 43(H) 40(H) -  Creatinine 0.44 - 1.00 mg/dL 2.29(H) 2.01(H)  2.31(H)  Sodium 135 - 145 mmol/L 138 139 -  Potassium 3.5 - 5.1 mmol/L 5.1 4.2 -  Chloride 98 - 111 mmol/L 111 109 -  CO2 22 - 32 mmol/L 20(L) 17(L) -  Calcium 8.9 - 10.3 mg/dL 8.0(L) 8.0(L) -  Total Protein 6.5 - 8.1 g/dL 5.1(L) 5.0(L) -  Total Bilirubin 0.3 - 1.2 mg/dL 1.2 0.8 -  Alkaline Phos 38 - 126 U/L 39 36(L) -  AST 15 - 41 U/L 117(H) 83(H) -  ALT 0 - 44 U/L 86(H) 72(H) -     Pertinent Imagings/Other Imagings Plain films and CT images have been personally visualized and interpreted; radiology reports have been reviewed. Decision making incorporated into the Impression / Recommendations.   I have spent more than 70  minutes for this patient encounter including review of prior medical records with greater than 50% of time being face to face and coordination of their care.  Electronically signed by:   Rosiland Oz, MD Infectious Disease Physician [facility] for Infectious Disease Pager: [phone]

## 2020-09-03 NOTE — Progress Notes (Signed)
PHARMACY - PHYSICIAN COMMUNICATION CRITICAL VALUE ALERT - BLOOD CULTURE IDENTIFICATION (BCID)  Whitney [name] is an 87 y.o. female who presented to [facility] on 08/30/2020 with arm cellulitis and pneumonia due to Covid  Assessment:  BCID + S. Aureus in 1 complete set out of 2.  No methicillin resistance detected.   Name of physician (or Provider) ContactedClarene Essex, FNP  Current antibiotics:  1) Vancomycin 1g IV q48h 2) Cefepime 2g IV q24h 3) Remdesivir 100mg  IV q24h (through 6/24)  Changes to prescribed antibiotics recommended:  Provider made aware of following recommendations to change in therapy (no disagreement in plan made known):  1) d/c Vancomycin as no methicillin resistance detected 2) Change Cefepime to Cefazolin 2gm IV q12h for S. Aureus per treatment algorithm. 3) Continue Remdesivir as ordered for Covid   Results for orders placed or performed during the hospital encounter of 09/09/2020  Blood Culture ID Panel (Reflexed) (Collected: 09/09/2020 10:45 PM)  Result Value Ref Range   Enterococcus faecalis NOT DETECTED NOT DETECTED   Enterococcus Faecium NOT DETECTED NOT DETECTED   Listeria monocytogenes NOT DETECTED NOT DETECTED   Staphylococcus species DETECTED (A) NOT DETECTED   Staphylococcus aureus (BCID) DETECTED (A) NOT DETECTED   Staphylococcus epidermidis NOT DETECTED NOT DETECTED   Staphylococcus lugdunensis NOT DETECTED NOT DETECTED   Streptococcus species NOT DETECTED NOT DETECTED   Streptococcus agalactiae NOT DETECTED NOT DETECTED   Streptococcus pneumoniae NOT DETECTED NOT DETECTED   Streptococcus pyogenes NOT DETECTED NOT DETECTED   A.calcoaceticus-baumannii NOT DETECTED NOT DETECTED   Bacteroides fragilis NOT DETECTED NOT DETECTED   Enterobacterales NOT DETECTED NOT DETECTED   Enterobacter cloacae complex NOT DETECTED NOT DETECTED   Escherichia coli NOT DETECTED NOT DETECTED   Klebsiella aerogenes NOT DETECTED NOT DETECTED   Klebsiella oxytoca NOT  DETECTED NOT DETECTED   Klebsiella pneumoniae NOT DETECTED NOT DETECTED   Proteus species NOT DETECTED NOT DETECTED   Salmonella species NOT DETECTED NOT DETECTED   Serratia marcescens NOT DETECTED NOT DETECTED   Haemophilus influenzae NOT DETECTED NOT DETECTED   Neisseria meningitidis NOT DETECTED NOT DETECTED   Pseudomonas aeruginosa NOT DETECTED NOT DETECTED   Stenotrophomonas maltophilia NOT DETECTED NOT DETECTED   Candida albicans NOT DETECTED NOT DETECTED   Candida auris NOT DETECTED NOT DETECTED   Candida glabrata NOT DETECTED NOT DETECTED   Candida krusei NOT DETECTED NOT DETECTED   Candida parapsilosis NOT DETECTED NOT DETECTED   Candida tropicalis NOT DETECTED NOT DETECTED   Cryptococcus neoformans/gattii NOT DETECTED NOT DETECTED   Meth resistant mecA/C and MREJ NOT DETECTED NOT DETECTED    Everette Rank, PharmD 09/03/2020  1:14 AM

## 2020-09-03 NOTE — Progress Notes (Signed)
     Referral received for Whitney [name] :goals of care discussion. Chart reviewed. Patient on COVID-19 precautions. Assessed at bedside and is unable to engage appropriately in discussions. Flat affect with minimal verbal response. Denies pain, states she is weak. Although it is noted patient lives with her granddaughter, Whitney [name], son is listed as POA. Attempted to contact patient's son, Whitney [name]. Unable to reach. Voicemail left with contact information given. Will need to speak with POA for complex decision making.   PMT will re-attempt to contact family at a later time/date. Detailed note and recommendations to follow once GOC has been completed.   Thank you for your referral and allowing PMT to assist in Whitney [name]'s care.   Alda Lea, AGPCNP-BC Palliative Medicine Team  Phone: [phone] Pager: [phone] Amion: N. [place]   NO CHARGE

## 2020-09-03 NOTE — Progress Notes (Signed)
Patient ID: Whitney [name], female   DOB: 1927-04-07, 87 y.o.   MRN: [id number]  PROGRESS NOTE    Whitney [name]  TDS:[id number] DOB: 04-28-1927 DOA: 09/04/2020 PCP: [name], MD   Brief Narrative:  87 year old female with history of paroxysmal A. fib, diabetes mellitus type 2, hypertension, hyperlipidemia, chronic diastolic CHF presented with fever and weakness.  On presentation, she was found to be febrile, tachycardic with lactic acid of 2.6, creatinine of 2.26.  Chest x-ray showed bibasilar opacities with small right pleural effusion.  She tested positive for COVID.  She was found to have right upper extremity cellulitis.  She was started on IV fluids, antibiotics, remdesivir and steroids.  She was subsequently found to have MSSA bacteremia.  Assessment & Plan:   Sepsis: Present on admission MSSA bacteremia Right upper extremity cellulitis Presented with fever, tachycardia, lactic acidosis, acute kidney injury with right upper extremity cellulitis and COVID-pneumonia. -She now has MSSA bacteremia.  ID has been also consulted.  Antibiotics has been switched to IV Ancef. -Repeat a.m. blood cultures.  Might need 2D echo.  COVID-19 pneumonia -Respiratory status stable.  Currently on room air.  Continue remdesivir and Decadron -Monitor daily inflammatory markers COVID-19 Labs  Recent Labs    08/24/2020 2245 09/02/20 1244 09/03/20 0443  DDIMER 2.90*  --   --   FERRITIN 335* 343* 404*  CRP 1.1* 2.7* 4.3*    Lab Results  Component Value Date   SARSCOV2NAA POSITIVE (A) 08/17/2020   [place] NEGATIVE 07/04/2020   [place] NEGATIVE 03/07/2020   [place] NEGATIVE 01/13/2020   Diabetes mellitus type 2 with hyperglycemia -A1c 5.9 on 07/19/2020.  Continue linagliptin.  CBGs with SSI  Essential hypertension -Blood pressure currently on the lower side.  Monitor.  Continue metoprolol  Acute kidney injury on CKD stage IIIb -Presented with creatinine of 2.31.   Baseline creatinine around 1.6-1.8.  Creatinine 2.29 today.  Continue IV fluids  Chronic diastolic CHF -Stable.  Strict input and output.  Daily weights.   -Be cautious with fluids.  Paroxysmal A. Fib -Currently rate controlled.  Continue amiodarone and metoprolol. -Not on Eliquis anymore because of rectal bleeding in the past  Prolonged QT interval -Avoid QT intervals prolonging medications.  Monitor electrolytes  Generalized deconditioning -PT eval -Palliative care consultation for goals of care discussion  Chronic macrocytic anemia -Questionable cause.  No signs of bleeding.  Monitor  Thrombocytopenia -No signs of bleeding.  Monitor     DVT prophylaxis: Lovenox Code Status: Full Family Communication: Tried calling the son on phone on 09/03/2020 but he did not pick up Disposition Plan: Status is: Inpatient  Remains inpatient appropriate because:Inpatient level of care appropriate due to severity of illness  Dispo: The patient is from: Home              Anticipated d/c is to: Home              Patient currently is not medically stable to d/c.   Difficult to place patient No   Consultants: ID.  Request palliative care consultation  Procedures: None  Antimicrobials:  Anti-infectives (From admission, onward)    Start     Dose/Rate Route Frequency Ordered Stop   09/04/20 0000  vancomycin (VANCOCIN) IVPB 1000 mg/200 mL premix  Status:  Discontinued        1,000 mg 200 mL/hr over 60 Minutes Intravenous Every 48 hours 09/02/20 0114 09/03/20 0123   09/03/20 2100  ceFAZolin (ANCEF) IVPB 2g/100 mL premix  2 g 200 mL/hr over 30 Minutes Intravenous Every 12 hours 09/03/20 0124     09/03/20 1000  remdesivir 100 mg in sodium chloride 0.9 % 100 mL IVPB  Status:  Discontinued       See Hyperspace for full Linked Orders Report.   100 mg 200 mL/hr over 30 Minutes Intravenous Daily 09/02/20 0320 09/02/20 0327   09/03/20 1000  remdesivir 100 mg in sodium chloride 0.9 % 100  mL IVPB       See Hyperspace for full Linked Orders Report.   100 mg 200 mL/hr over 30 Minutes Intravenous Daily 09/02/20 0057 09/07/20 0959   09/02/20 2200  ceFEPIme (MAXIPIME) 2 g in sodium chloride 0.9 % 100 mL IVPB  Status:  Discontinued        2 g 200 mL/hr over 30 Minutes Intravenous Every 24 hours 09/02/20 0329 09/03/20 0123   09/02/20 1000  ceFEPIme (MAXIPIME) 2 g in sodium chloride 0.9 % 100 mL IVPB  Status:  Discontinued        2 g 200 mL/hr over 30 Minutes Intravenous Every 12 hours 09/02/20 0320 09/02/20 0328   09/02/20 0319  remdesivir 200 mg in sodium chloride 0.9% 250 mL IVPB  Status:  Discontinued       See Hyperspace for full Linked Orders Report.   200 mg 580 mL/hr over 30 Minutes Intravenous Once 09/02/20 0320 09/02/20 0327   09/02/20 0230  remdesivir 100 mg in sodium chloride 0.9 % 100 mL IVPB       See Hyperspace for full Linked Orders Report.   100 mg 200 mL/hr over 30 Minutes Intravenous  Once 09/02/20 0057 09/02/20 0534   09/02/20 0130  remdesivir 100 mg in sodium chloride 0.9 % 100 mL IVPB       See Hyperspace for full Linked Orders Report.   100 mg 200 mL/hr over 30 Minutes Intravenous  Once 09/02/20 0057 09/02/20 0421   09/04/2020 2230  ceFEPIme (MAXIPIME) 2 g in sodium chloride 0.9 % 100 mL IVPB        2 g 200 mL/hr over 30 Minutes Intravenous  Once 08/17/2020 2226 08/20/2020 2356   09/09/2020 2230  metroNIDAZOLE (FLAGYL) IVPB 500 mg        500 mg 100 mL/hr over 60 Minutes Intravenous  Once 08/31/2020 2226 09/02/20 0059   09/10/2020 2230  vancomycin (VANCOCIN) IVPB 1000 mg/200 mL premix        1,000 mg 200 mL/hr over 60 Minutes Intravenous  Once 08/16/2020 2226 09/02/20 0206        Subjective: Patient seen and examined at bedside.  Poor historian.  Denies worsening shortness of breath or fever or cough.  Objective: Vitals:   09/02/20 1725 09/02/20 1943 09/03/20 0447 09/03/20 0932  BP: 108/77 106/70 (!) 180/77 101/70  Pulse: 82 91 93 93  Resp: 18 14 16 18    Temp: 97.6 F (36.4 C) 98 F (36.7 C) 97.7 F (36.5 C) (!) 97.4 F (36.3 C)  TempSrc: Oral Oral Oral Oral  SpO2: 98% 100%  97%  Weight:      Height:        Intake/Output Summary (Last 24 hours) at 09/03/2020 1118 Last data filed at 09/03/2020 0900 Gross per 24 hour  Intake 440 ml  Output 1 ml  Net 439 ml   Filed Weights   09/02/20 0326 09/02/20 0400  Weight: 69.1 kg 69.9 kg    Examination:  General exam: Appears calm and comfortable.  Currently on room  air.  Elderly female lying in bed. Respiratory system: Bilateral decreased breath sounds at bases with scattered crackles Cardiovascular system: S1 & S2 heard, Rate controlled Gastrointestinal system: Abdomen is nondistended, soft and nontender. Normal bowel sounds heard. Extremities: No cyanosis, clubbing; trace lower extremity edema. Central nervous system: Awake, extremely slow to respond.  Very poor historian.  No focal neurological deficits. Moving extremities Skin: Right forearm and upper arm slightly erythematous and warm to touch Psychiatry: Flat affect.  Hardly participates in any conversation.   Data Reviewed: I have personally reviewed following labs and imaging studies  CBC: Recent Labs  Lab 09/11/2020 2314 09/02/20 0849  WBC 6.0 6.8  NEUTROABS  --  6.0  HGB 10.0* 10.0*  HCT 30.7* 29.6*  MCV 107.3* 105.3*  PLT 144* 193*   Basic Metabolic Panel: Recent Labs  Lab 08/28/2020 2245 09/02/20 0638 09/02/20 1141 09/03/20 0443  NA 137  --  139 138  K 4.2  --  4.2 5.1  CL 105  --  109 111  CO2 20*  --  17* 20*  GLUCOSE 198*  --  162* 173*  BUN 42*  --  40* 43*  CREATININE 2.26* 2.31* 2.01* 2.29*  CALCIUM 8.3*  --  8.0* 8.0*   GFR: Estimated Creatinine Clearance: 15.3 mL/min (A) (by C-G formula based on SCr of 2.29 mg/dL (H)). Liver Function Tests: Recent Labs  Lab 08/22/2020 2245 09/02/20 1141 09/03/20 0443  AST 98* 83* 117*  ALT 84* 72* 86*  ALKPHOS 46 36* 39  BILITOT 1.4* 0.8 1.2  PROT 5.6*  5.0* 5.1*  ALBUMIN 3.0* 2.4* 2.6*   No results for input(s): LIPASE, AMYLASE in the last 168 hours. No results for input(s): AMMONIA in the last 168 hours. Coagulation Profile: Recent Labs  Lab 08/29/2020 2245 09/02/20 0638  INR 1.3* 1.4*   Cardiac Enzymes: No results for input(s): CKTOTAL, CKMB, CKMBINDEX, TROPONINI in the last 168 hours. BNP (last 3 results) No results for input(s): PROBNP in the last 8760 hours. HbA1C: No results for input(s): HGBA1C in the last 72 hours. CBG: Recent Labs  Lab 09/02/20 0721 09/02/20 1218 09/02/20 1719 09/02/20 1939 09/03/20 0747  GLUCAP 167* 148* 203* 185* 148*   Lipid Profile: No results for input(s): CHOL, HDL, LDLCALC, TRIG, CHOLHDL, LDLDIRECT in the last 72 hours. Thyroid Function Tests: No results for input(s): TSH, T4TOTAL, FREET4, T3FREE, THYROIDAB in the last 72 hours. Anemia Panel: Recent Labs    09/02/20 1244 09/03/20 0443  FERRITIN 343* 404*   Sepsis Labs: Recent Labs  Lab 08/25/2020 2245 09/02/20 0357 09/02/20 0638 09/03/20 0443  PROCALCITON  --   --  0.25 0.36  LATICACIDVEN 2.6* 2.9* 2.5*  --     Recent Results (from the past 240 hour(s))  Resp Panel by RT-PCR (Flu A&B, Covid) Nasopharyngeal Swab     Status: Abnormal   Collection Time: 08/28/2020 10:45 PM   Specimen: Nasopharyngeal Swab; Nasopharyngeal(NP) swabs in vial transport medium  Result Value Ref Range Status   SARS Coronavirus 2 by RT PCR POSITIVE (A) NEGATIVE Final    Comment: RESULT CALLED TO, READ BACK BY AND VERIFIED WITH: HALEY SHEPHARD AT 0016 ON 09/02/20 BY MAJ (NOTE) SARS-CoV-2 target nucleic acids are DETECTED.  The SARS-CoV-2 RNA is generally detectable in upper respiratory specimens during the acute phase of infection. Positive results are indicative of the presence of the identified virus, but do not rule out bacterial infection or co-infection with other pathogens not detected by the test. Clinical correlation  with patient history  and other diagnostic information is necessary to determine patient infection status. The expected result is Negative.  Fact Sheet for Patients: EntrepreneurPulse.com.au  Fact Sheet for Healthcare Providers: IncredibleEmployment.be  This test is not yet approved or cleared by the [place] FDA and  has been authorized for detection and/or diagnosis of SARS-CoV-2 by FDA under an Emergency Use Authorization (EUA).  This EUA will remain in effect (meaning this tes t can be used) for the duration of  the COVID-19 declaration under Section 564(b)(1) of the Act, 21 U.S.C. section 360bbb-3(b)(1), unless the authorization is terminated or revoked sooner.     Influenza A by PCR NEGATIVE NEGATIVE Final   Influenza B by PCR NEGATIVE NEGATIVE Final    Comment: (NOTE) The Xpert Xpress SARS-CoV-2/FLU/RSV plus assay is intended as an aid in the diagnosis of influenza from Nasopharyngeal swab specimens and should not be used as a sole basis for treatment. Nasal washings and aspirates are unacceptable for Xpert Xpress SARS-CoV-2/FLU/RSV testing.  Fact Sheet for Patients: EntrepreneurPulse.com.au  Fact Sheet for Healthcare Providers: IncredibleEmployment.be  This test is not yet approved or cleared by the [place] FDA and has been authorized for detection and/or diagnosis of SARS-CoV-2 by FDA under an Emergency Use Authorization (EUA). This EUA will remain in effect (meaning this test can be used) for the duration of the COVID-19 declaration under Section 564(b)(1) of the Act, 21 U.S.C. section 360bbb-3(b)(1), unless the authorization is terminated or revoked.  Performed at [facility], [place] [address], [place], [place] [address]   Blood Culture (routine x 2)     Status: Abnormal (Preliminary result)   Collection Time: 09/12/2020 10:45 PM   Specimen: BLOOD  Result Value Ref Range Status    Specimen Description   Final    BLOOD LEFT ANTECUBITAL Performed at [facility] [address], [place], [place] [address]    Special Requests   Final    BOTTLES DRAWN AEROBIC AND ANAEROBIC Blood Culture adequate volume Performed at [facility] [address], [place], [place] [address]    Culture  Setup Time   Final    GRAM POSITIVE COCCI IN BOTH AEROBIC AND ANAEROBIC BOTTLES CRITICAL RESULT CALLED TO, READ BACK BY AND VERIFIED WITH: PHARMD LYDIA CHEN AT 2147 ON 09/02/20 BY KJ    Culture (A)  Final    STAPHYLOCOCCUS AUREUS SUSCEPTIBILITIES TO FOLLOW Performed at [facility], [place] [address] [place], [place] [address]    Report Status PENDING  Incomplete  Blood Culture (routine x 2)     Status: None (Preliminary result)   Collection Time: 09/02/2020 10:45 PM   Specimen: BLOOD  Result Value Ref Range Status   Specimen Description   Final    BLOOD BLOOD LEFT FOREARM Performed at [facility] [address], [place], [place] [address]    Special Requests   Final    BOTTLES DRAWN AEROBIC AND ANAEROBIC Blood Culture adequate volume Performed at [facility] [address] [place], [place] [address]    Culture   Final    NO GROWTH 1 DAY Performed at [facility], [place] [address], [place], [place] [address]    Report Status PENDING  Incomplete  Urine culture     Status: None   Collection Time: 09/11/2020 10:45 PM   Specimen: In/Out Cath Urine  Result Value Ref Range Status   Specimen Description   Final  IN/OUT CATH URINE Performed at [facility], [place] [address] [place], [place] [address]    Special Requests   Final    NONE Performed at [facility], [place] [address], [place], [place] [address]    Culture   Final    NO GROWTH Performed at [facility], [place] [address], [place], [place] [address]    Report Status 09/03/2020 FINAL  Final   Blood Culture ID Panel (Reflexed)     Status: Abnormal   Collection Time: 08/20/2020 10:45 PM  Result Value Ref Range Status   Enterococcus faecalis NOT DETECTED NOT DETECTED Final   Enterococcus Faecium NOT DETECTED NOT DETECTED Final   Listeria monocytogenes NOT DETECTED NOT DETECTED Final   Staphylococcus species DETECTED (A) NOT DETECTED Final    Comment: CRITICAL RESULT CALLED TO, READ BACK BY AND VERIFIED WITH: PHARMD LYDIA CHEN AT 2147 ON 09/02/20 BY KJ      Staphylococcus aureus (BCID) DETECTED (A) NOT DETECTED Final    Comment: CRITICAL RESULT CALLED TO, READ BACK BY AND VERIFIED WITH: PHARMD LYDIA CHEN AT 2147 ON 09/02/20 BY KJ      Staphylococcus epidermidis NOT DETECTED NOT DETECTED Final   Staphylococcus lugdunensis NOT DETECTED NOT DETECTED Final   Streptococcus species NOT DETECTED NOT DETECTED Final   Streptococcus agalactiae NOT DETECTED NOT DETECTED Final   Streptococcus pneumoniae NOT DETECTED NOT DETECTED Final   Streptococcus pyogenes NOT DETECTED NOT DETECTED Final   A.calcoaceticus-baumannii NOT DETECTED NOT DETECTED Final   Bacteroides fragilis NOT DETECTED NOT DETECTED Final   Enterobacterales NOT DETECTED NOT DETECTED Final   Enterobacter cloacae complex NOT DETECTED NOT DETECTED Final   Escherichia coli NOT DETECTED NOT DETECTED Final   Klebsiella aerogenes NOT DETECTED NOT DETECTED Final   Klebsiella oxytoca NOT DETECTED NOT DETECTED Final   Klebsiella pneumoniae NOT DETECTED NOT DETECTED Final   Proteus species NOT DETECTED NOT DETECTED Final   Salmonella species NOT DETECTED NOT DETECTED Final   Serratia marcescens NOT DETECTED NOT DETECTED Final   Haemophilus influenzae NOT DETECTED NOT DETECTED Final   Neisseria meningitidis NOT DETECTED NOT DETECTED Final   Pseudomonas aeruginosa NOT DETECTED NOT DETECTED Final   Stenotrophomonas maltophilia NOT DETECTED NOT DETECTED Final   Candida albicans NOT DETECTED NOT DETECTED Final   Candida auris NOT  DETECTED NOT DETECTED Final   Candida glabrata NOT DETECTED NOT DETECTED Final   Candida krusei NOT DETECTED NOT DETECTED Final   Candida parapsilosis NOT DETECTED NOT DETECTED Final   Candida tropicalis NOT DETECTED NOT DETECTED Final   Cryptococcus neoformans/gattii NOT DETECTED NOT DETECTED Final   Meth resistant mecA/C and MREJ NOT DETECTED NOT DETECTED Final    Comment: Performed at [facility], [address], [place], [place] [address]  MRSA PCR Screening     Status: None   Collection Time: 09/02/20 11:16 AM  Result Value Ref Range Status   MRSA by PCR NEGATIVE NEGATIVE Final    Comment:        The GeneXpert MRSA Assay (FDA approved for NASAL specimens only), is one component of a comprehensive MRSA colonization surveillance program. It is not intended to diagnose MRSA infection nor to guide or monitor treatment for MRSA infections. Performed at [facility], [place] [address], [place],  [address]          Radiology Studies: [facility] Chest Port 1 View  Result Date: 09/07/2020 CLINICAL DATA:  New cough for 2 days,  possible sepsis, initial encounter EXAM: PORTABLE CHEST 1 VIEW COMPARISON:  07/04/2020 FINDINGS: Cardiac shadow is enlarged but stable. Aortic calcifications are noted. Right-sided pleural effusion is again seen. Bibasilar airspace opacities are noted. No bony abnormality is noted. IMPRESSION: Bibasilar opacity with associated right-sided pleural effusion. The left basilar changes are new from the prior exam. Electronically Signed   By: Inez Catalina M.D.   On: 09/02/2020 22:57        Scheduled Meds:  amiodarone  200 mg Oral Daily   vitamin C  500 mg Oral Daily   atorvastatin  10 mg Oral Daily   dexamethasone (DECADRON) injection  6 mg Intravenous Daily   enoxaparin (LOVENOX) injection  30 mg Subcutaneous Q24H   linagliptin  5 mg Oral Daily   metoprolol succinate  100 mg Oral BID   potassium chloride SA  40 mEq Oral Daily   zinc  sulfate  220 mg Oral Daily   Continuous Infusions:   ceFAZolin (ANCEF) IV     remdesivir 100 mg in NS 100 mL 100 mg (09/03/20 1011)          Aline August, MD [facility] 09/03/2020, 11:18 AM

## 2020-09-04 ENCOUNTER — Inpatient Hospital Stay (HOSPITAL_COMMUNITY): Payer: Medicare Other

## 2020-09-04 DIAGNOSIS — J1282 Pneumonia due to coronavirus disease 2019: Secondary | ICD-10-CM

## 2020-09-04 DIAGNOSIS — M7989 Other specified soft tissue disorders: Secondary | ICD-10-CM | POA: Diagnosis not present

## 2020-09-04 DIAGNOSIS — L8990 Pressure ulcer of unspecified site, unspecified stage: Secondary | ICD-10-CM | POA: Insufficient documentation

## 2020-09-04 DIAGNOSIS — Z7189 Other specified counseling: Secondary | ICD-10-CM

## 2020-09-04 DIAGNOSIS — N189 Chronic kidney disease, unspecified: Secondary | ICD-10-CM

## 2020-09-04 DIAGNOSIS — I5032 Chronic diastolic (congestive) heart failure: Secondary | ICD-10-CM

## 2020-09-04 DIAGNOSIS — Z515 Encounter for palliative care: Secondary | ICD-10-CM

## 2020-09-04 DIAGNOSIS — N179 Acute kidney failure, unspecified: Secondary | ICD-10-CM

## 2020-09-04 LAB — CBC WITH DIFFERENTIAL/PLATELET
Abs Immature Granulocytes: 0.04 10*3/uL (ref 0.00–0.07)
Basophils Absolute: 0 10*3/uL (ref 0.0–0.1)
Basophils Relative: 0 %
Eosinophils Absolute: 0 10*3/uL (ref 0.0–0.5)
Eosinophils Relative: 0 %
HCT: 32 % — ABNORMAL LOW (ref 36.0–46.0)
Hemoglobin: 10.8 g/dL — ABNORMAL LOW (ref 12.0–15.0)
Immature Granulocytes: 1 %
Lymphocytes Relative: 3 %
Lymphs Abs: 0.2 10*3/uL — ABNORMAL LOW (ref 0.7–4.0)
MCH: 35.1 pg — ABNORMAL HIGH (ref 26.0–34.0)
MCHC: 33.8 g/dL (ref 30.0–36.0)
MCV: 103.9 fL — ABNORMAL HIGH (ref 80.0–100.0)
Monocytes Absolute: 0.3 10*3/uL (ref 0.1–1.0)
Monocytes Relative: 4 %
Neutro Abs: 7.1 10*3/uL (ref 1.7–7.7)
Neutrophils Relative %: 92 %
Platelets: 128 10*3/uL — ABNORMAL LOW (ref 150–400)
RBC: 3.08 MIL/uL — ABNORMAL LOW (ref 3.87–5.11)
RDW: 16 % — ABNORMAL HIGH (ref 11.5–15.5)
WBC: 7.7 10*3/uL (ref 4.0–10.5)
nRBC: 0 % (ref 0.0–0.2)

## 2020-09-04 LAB — CULTURE, BLOOD (ROUTINE X 2): Special Requests: ADEQUATE

## 2020-09-04 LAB — COMPREHENSIVE METABOLIC PANEL
ALT: 78 U/L — ABNORMAL HIGH (ref 0–44)
AST: 84 U/L — ABNORMAL HIGH (ref 15–41)
Albumin: 2.4 g/dL — ABNORMAL LOW (ref 3.5–5.0)
Alkaline Phosphatase: 40 U/L (ref 38–126)
Anion gap: 11 (ref 5–15)
BUN: 49 mg/dL — ABNORMAL HIGH (ref 8–23)
CO2: 18 mmol/L — ABNORMAL LOW (ref 22–32)
Calcium: 7.7 mg/dL — ABNORMAL LOW (ref 8.9–10.3)
Chloride: 107 mmol/L (ref 98–111)
Creatinine, Ser: 2.44 mg/dL — ABNORMAL HIGH (ref 0.44–1.00)
GFR, Estimated: 18 mL/min — ABNORMAL LOW (ref >60)
Glucose, Bld: 181 mg/dL — ABNORMAL HIGH (ref 70–99)
Potassium: 5.1 mmol/L (ref 3.5–5.1)
Sodium: 136 mmol/L (ref 135–145)
Total Bilirubin: 0.9 mg/dL (ref 0.3–1.2)
Total Protein: 5 g/dL — ABNORMAL LOW (ref 6.5–8.1)

## 2020-09-04 LAB — GLUCOSE, CAPILLARY
Glucose-Capillary: 166 mg/dL — ABNORMAL HIGH (ref 70–99)
Glucose-Capillary: 190 mg/dL — ABNORMAL HIGH (ref 70–99)
Glucose-Capillary: 224 mg/dL — ABNORMAL HIGH (ref 70–99)

## 2020-09-04 LAB — C-REACTIVE PROTEIN: CRP: 3.8 mg/dL — ABNORMAL HIGH (ref <1.0)

## 2020-09-04 LAB — FERRITIN: Ferritin: 302 ng/mL (ref 11–307)

## 2020-09-04 MED ORDER — ALBUMIN HUMAN 25 % IV SOLN
25.0000 g | Freq: Four times a day (QID) | INTRAVENOUS | Status: DC
  Filled 2020-09-04: qty 100

## 2020-09-04 MED ORDER — ALBUMIN HUMAN 25 % IV SOLN
25.0000 g | Freq: Four times a day (QID) | INTRAVENOUS | Status: DC
  Filled 2020-09-04 (×2): qty 100

## 2020-09-04 MED ORDER — INSULIN ASPART 100 UNIT/ML IJ SOLN
0.0000 [IU] | Freq: Every day | INTRAMUSCULAR | Status: DC
  Administered 2020-09-04: 2 [IU] via SUBCUTANEOUS

## 2020-09-04 MED ORDER — INSULIN ASPART 100 UNIT/ML IJ SOLN
0.0000 [IU] | Freq: Three times a day (TID) | INTRAMUSCULAR | Status: DC
  Administered 2020-09-05 (×2): 3 [IU] via SUBCUTANEOUS
  Administered 2020-09-05: 5 [IU] via SUBCUTANEOUS
  Administered 2020-09-06 (×2): 3 [IU] via SUBCUTANEOUS

## 2020-09-04 MED ORDER — ALBUMIN HUMAN 25 % IV SOLN
25.0000 g | Freq: Four times a day (QID) | INTRAVENOUS | Status: DC
  Administered 2020-09-04 – 2020-09-05 (×5): 25 g via INTRAVENOUS
  Filled 2020-09-04 (×3): qty 100

## 2020-09-04 NOTE — Progress Notes (Signed)
Inpatient Diabetes Program Recommendations  AACE/ADA: New Consensus Statement on Inpatient Glycemic Control  Target Ranges:  Prepandial:   less than 140 mg/dL      Peak postprandial:   less than 180 mg/dL (1-2 hours)      Critically ill patients:  140 - 180 mg/dL  Results for RYLLIE, NIELAND (MRN [id number]) as of 09/04/2020 13:33  Ref. Range 09/03/2020 07:47 09/03/2020 11:49 09/03/2020 17:09 09/03/2020 19:38 09/04/2020 07:41 09/04/2020 11:58  Glucose-Capillary Latest Ref Range: 70 - 99 mg/dL 148 (H) 193 (H) 190 (H) 241 (H) 166 (H) 190 (H)   Review of Glycemic Control  Diabetes history: DM2 Outpatient Diabetes medications: Januvia 50 mg daily Current orders for Inpatient glycemic control: Tradjenta 5 mg daily; Decadron 6 mg daily  Inpatient Diabetes Program Recommendations:    Insulin: While inpatient, please consider ordering Novolog 0-9 units TID with meals and Novolog 0-5 units QHS.  Thanks, Barnie Alderman, RN, MSN, CDE Diabetes Coordinator Inpatient Diabetes Program [phone] (Team Pager from 8am to 5pm)

## 2020-09-04 NOTE — Progress Notes (Signed)
Progress Note    Whitney [name]   ZOX:[id number]  DOB: January 02, 1928  DOA: 08/26/2020     2  PCP: [name], MD  CC: fever, weakness  Hospital Course: 87 year old female with history of paroxysmal A. fib, diabetes mellitus type 2, hypertension, hyperlipidemia, chronic diastolic CHF presented with fever and weakness.  On presentation, she was found to be febrile, tachycardic with lactic acid of 2.6, creatinine of 2.26.  Chest x-ray showed bibasilar opacities with small right pleural effusion.  She tested positive for COVID.  She was found to have right upper extremity cellulitis.  She was started on IV fluids, antibiotics, remdesivir and steroids.  She was subsequently found to have MSSA bacteremia  Interval History:  Sitting up in recliner bedside.  Obvious impairment with some of her hearing but otherwise able to carry on conversation mildly well.  Called and spoke with her daughter also after seeing patient.  ROS: Constitutional: negative for chills and fevers, Respiratory: negative for cough, Cardiovascular: negative for chest pain, and Gastrointestinal: negative for abdominal pain  Assessment & Plan:  Severe Sepsis MSSA bacteremia Right upper extremity cellulitis Presented with fever, tachycardia, lactic acidosis, acute kidney injury with right upper extremity cellulitis and COVID-pneumonia. -now has MSSA bacteremia.  ID has been also consulted.  Antibiotics has been switched to IV Ancef. -TTE negative; TEE recommended per ID; cardiology consulted    COVID-19 pneumonia -Respiratory status stable.  Currently on room air.  Continue remdesivir and Decadron -Monitor daily inflammatory markers  Diabetes mellitus type 2 with hyperglycemia -A1c 5.9 on 07/19/2020.  Continue linagliptin.  CBGs with SSI   Essential hypertension -Blood pressure currently on the lower side.  Monitor.  Continue metoprolol  Acute kidney injury on CKD stage IIIb -Presented with creatinine of 2.31.   Baseline creatinine around 1.6-1.8.  - Continue IV fluids; add albumin   Chronic diastolic CHF -Stable.  Strict input and output.  Daily weights.   -Be cautious with fluids.   Paroxysmal A. Fib -Currently rate controlled.  Continue amiodarone and metoprolol. -Not on Eliquis anymore because of rectal bleeding in the past  Prolonged QT interval -Avoid QT intervals prolonging medications.  Monitor electrolytes   Generalized deconditioning -PT eval -Palliative care consultation for goals of care discussion   Chronic macrocytic anemia - likely mixed 2/2 ACD -No signs of bleeding.  Monitor   Thrombocytopenia -No signs of bleeding.  Monitor  Old records reviewed in assessment of this patient  Antimicrobials: Cefepime 6/19 >> 6/20 Ancef 6/20 >> current Remdesivir 6/20 >> current   DVT prophylaxis: enoxaparin (LOVENOX) injection 30 mg Start: 09/02/20 1000   Code Status:   Code Status: Full Code Family Communication: daughter  Disposition Plan: Status is: Inpatient  Remains inpatient appropriate because:Ongoing diagnostic testing needed not appropriate for outpatient work up, IV treatments appropriate due to intensity of illness or inability to take PO, and Inpatient level of care appropriate due to severity of illness  Dispo: The patient is from: SNF              Anticipated d/c is to: SNF              Patient currently is not medically stable to d/c.   Difficult to place patient No  Risk of unplanned readmission score: Unplanned Admission- Pilot do not use: 37.43   Objective: Blood pressure 106/67, pulse 81, temperature (!) 97.3 F (36.3 C), temperature source Oral, resp. rate 19, height 5\' 6"  (1.676 m), weight 71.5 kg,  SpO2 100 %.  Examination: General appearance:  Difficulty with hearing, slowed mentation, no distress Head: Normocephalic, without obvious abnormality, atraumatic Eyes:  EOMI Lungs:  Bibasilar crackles, no wheezing Heart: regular rate and rhythm and  S1, S2 normal Abdomen: normal findings: bowel sounds normal and soft, non-tender Extremities:  2-3+ pitting edema in right upper extremity with erythema appreciated, no significant tenderness.  3+ bilateral lower extremity pitting edema also appreciated Skin: mobility and turgor normal Neurologic: Grossly normal  Consultants:  ID Cardiology  Procedures:    Data Reviewed: I have personally reviewed following labs and imaging studies Results for orders placed or performed during the hospital encounter of 09/05/2020 (from the past 24 hour(s))  Glucose, capillary     Status: Abnormal   Collection Time: 09/03/20  5:09 PM  Result Value Ref Range   Glucose-Capillary 190 (H) 70 - 99 mg/dL  Glucose, capillary     Status: Abnormal   Collection Time: 09/03/20  7:38 PM  Result Value Ref Range   Glucose-Capillary 241 (H) 70 - 99 mg/dL  CBC with Differential/Platelet     Status: Abnormal   Collection Time: 09/04/20 12:57 AM  Result Value Ref Range   WBC 7.7 4.0 - 10.5 K/uL   RBC 3.08 (L) 3.87 - 5.11 MIL/uL   Hemoglobin 10.8 (L) 12.0 - 15.0 g/dL   HCT 32.0 (L) 36.0 - 46.0 %   MCV 103.9 (H) 80.0 - 100.0 fL   MCH 35.1 (H) 26.0 - 34.0 pg   MCHC 33.8 30.0 - 36.0 g/dL   RDW 16.0 (H) 11.5 - 15.5 %   Platelets 128 (L) 150 - 400 K/uL   nRBC 0.0 0.0 - 0.2 %   Neutrophils Relative % 92 %   Neutro Abs 7.1 1.7 - 7.7 K/uL   Lymphocytes Relative 3 %   Lymphs Abs 0.2 (L) 0.7 - 4.0 K/uL   Monocytes Relative 4 %   Monocytes Absolute 0.3 0.1 - 1.0 K/uL   Eosinophils Relative 0 %   Eosinophils Absolute 0.0 0.0 - 0.5 K/uL   Basophils Relative 0 %   Basophils Absolute 0.0 0.0 - 0.1 K/uL   Immature Granulocytes 1 %   Abs Immature Granulocytes 0.04 0.00 - 0.07 K/uL  C-reactive protein     Status: Abnormal   Collection Time: 09/04/20 12:57 AM  Result Value Ref Range   CRP 3.8 (H) <1.0 mg/dL  Comprehensive metabolic panel     Status: Abnormal   Collection Time: 09/04/20 12:57 AM  Result Value Ref Range    Sodium 136 135 - 145 mmol/L   Potassium 5.1 3.5 - 5.1 mmol/L   Chloride 107 98 - 111 mmol/L   CO2 18 (L) 22 - 32 mmol/L   Glucose, Bld 181 (H) 70 - 99 mg/dL   BUN 49 (H) 8 - 23 mg/dL   Creatinine, Ser 2.44 (H) 0.44 - 1.00 mg/dL   Calcium 7.7 (L) 8.9 - 10.3 mg/dL   Total Protein 5.0 (L) 6.5 - 8.1 g/dL   Albumin 2.4 (L) 3.5 - 5.0 g/dL   AST 84 (H) 15 - 41 U/L   ALT 78 (H) 0 - 44 U/L   Alkaline Phosphatase 40 38 - 126 U/L   Total Bilirubin 0.9 0.3 - 1.2 mg/dL   GFR, Estimated 18 (L) >60 mL/min   Anion gap 11 5 - 15  Ferritin     Status: None   Collection Time: 09/04/20 12:57 AM  Result Value Ref Range   Ferritin 302  11 - 307 ng/mL  Glucose, capillary     Status: Abnormal   Collection Time: 09/04/20  7:41 AM  Result Value Ref Range   Glucose-Capillary 166 (H) 70 - 99 mg/dL  Glucose, capillary     Status: Abnormal   Collection Time: 09/04/20 11:58 AM  Result Value Ref Range   Glucose-Capillary 190 (H) 70 - 99 mg/dL    Recent Results (from the past 240 hour(s))  Resp Panel by RT-PCR (Flu A&B, Covid) Nasopharyngeal Swab     Status: Abnormal   Collection Time: 09/03/2020 10:45 PM   Specimen: Nasopharyngeal Swab; Nasopharyngeal(NP) swabs in vial transport medium  Result Value Ref Range Status   SARS Coronavirus 2 by RT PCR POSITIVE (A) NEGATIVE Final    Comment: RESULT CALLED TO, READ BACK BY AND VERIFIED WITH: HALEY SHEPHARD AT 0016 ON 09/02/20 BY MAJ (NOTE) SARS-CoV-2 target nucleic acids are DETECTED.  The SARS-CoV-2 RNA is generally detectable in upper respiratory specimens during the acute phase of infection. Positive results are indicative of the presence of the identified virus, but do not rule out bacterial infection or co-infection with other pathogens not detected by the test. Clinical correlation with patient history and other diagnostic information is necessary to determine patient infection status. The expected result is Negative.  Fact Sheet for  Patients: EntrepreneurPulse.com.au  Fact Sheet for Healthcare Providers: IncredibleEmployment.be  This test is not yet approved or cleared by the [place] FDA and  has been authorized for detection and/or diagnosis of SARS-CoV-2 by FDA under an Emergency Use Authorization (EUA).  This EUA will remain in effect (meaning this tes t can be used) for the duration of  the COVID-19 declaration under Section 564(b)(1) of the Act, 21 U.S.C. section 360bbb-3(b)(1), unless the authorization is terminated or revoked sooner.     Influenza A by PCR NEGATIVE NEGATIVE Final   Influenza B by PCR NEGATIVE NEGATIVE Final    Comment: (NOTE) The Xpert Xpress SARS-CoV-2/FLU/RSV plus assay is intended as an aid in the diagnosis of influenza from Nasopharyngeal swab specimens and should not be used as a sole basis for treatment. Nasal washings and aspirates are unacceptable for Xpert Xpress SARS-CoV-2/FLU/RSV testing.  Fact Sheet for Patients: EntrepreneurPulse.com.au  Fact Sheet for Healthcare Providers: IncredibleEmployment.be  This test is not yet approved or cleared by the [place] FDA and has been authorized for detection and/or diagnosis of SARS-CoV-2 by FDA under an Emergency Use Authorization (EUA). This EUA will remain in effect (meaning this test can be used) for the duration of the COVID-19 declaration under Section 564(b)(1) of the Act, 21 U.S.C. section 360bbb-3(b)(1), unless the authorization is terminated or revoked.  Performed at [facility], [place] [address], [place], [place] [address]   Blood Culture (routine x 2)     Status: Abnormal   Collection Time: 09/05/2020 10:45 PM   Specimen: BLOOD  Result Value Ref Range Status   Specimen Description   Final    BLOOD LEFT ANTECUBITAL Performed at [facility] [address], [place], [place] [address]    Special  Requests   Final    BOTTLES DRAWN AEROBIC AND ANAEROBIC Blood Culture adequate volume Performed at [facility] [address], [place], [place] [address]    Culture  Setup Time   Final    GRAM POSITIVE COCCI IN BOTH AEROBIC AND ANAEROBIC BOTTLES CRITICAL RESULT CALLED TO, READ BACK BY AND VERIFIED WITH: PHARMD LYDIA CHEN AT 2147 ON 09/02/20 BY  KJ Performed at [facility], [place] [address], [place], [place] [address]    Culture STAPHYLOCOCCUS AUREUS (A)  Final   Report Status 09/04/2020 FINAL  Final   Organism ID, Bacteria STAPHYLOCOCCUS AUREUS  Final      Susceptibility   Staphylococcus aureus - MIC*    CIPROFLOXACIN <=0.5 SENSITIVE Sensitive     ERYTHROMYCIN <=0.25 SENSITIVE Sensitive     GENTAMICIN <=0.5 SENSITIVE Sensitive     OXACILLIN <=0.25 SENSITIVE Sensitive     TETRACYCLINE <=1 SENSITIVE Sensitive     VANCOMYCIN <=0.5 SENSITIVE Sensitive     TRIMETH/SULFA <=10 SENSITIVE Sensitive     CLINDAMYCIN <=0.25 SENSITIVE Sensitive     RIFAMPIN <=0.5 SENSITIVE Sensitive     Inducible Clindamycin NEGATIVE Sensitive     * STAPHYLOCOCCUS AUREUS  Blood Culture (routine x 2)     Status: None (Preliminary result)   Collection Time: 08/18/2020 10:45 PM   Specimen: BLOOD  Result Value Ref Range Status   Specimen Description   Final    BLOOD BLOOD LEFT FOREARM Performed at [facility] [address] [place], [place] [address]    Special Requests   Final    BOTTLES DRAWN AEROBIC AND ANAEROBIC Blood Culture adequate volume Performed at [facility] [address] [place], [place] [address]    Culture   Final    NO GROWTH 2 DAYS Performed at [facility] [address] [place], [place] [address]    Report Status PENDING  Incomplete  Urine culture     Status: None   Collection Time: 08/17/2020 10:45 PM   Specimen: In/Out Cath Urine  Result Value Ref Range Status   Specimen Description   Final    IN/OUT CATH  URINE Performed at [facility] [address], [place], [place] [address]    Special Requests   Final    NONE Performed at [facility], [place] [address] [place], [place] [address]    Culture   Final    NO GROWTH Performed at [facility], [place] [address], [place],  [address]    Report Status 09/03/2020 FINAL  Final  Blood Culture ID Panel (Reflexed)     Status: Abnormal   Collection Time: 09/03/2020 10:45 PM  Result Value Ref Range Status   Enterococcus faecalis NOT DETECTED NOT DETECTED Final   Enterococcus Faecium NOT DETECTED NOT DETECTED Final   Listeria monocytogenes NOT DETECTED NOT DETECTED Final   Staphylococcus species DETECTED (A) NOT DETECTED Final    Comment: CRITICAL RESULT CALLED TO, READ BACK BY AND VERIFIED WITH: PHARMD LYDIA CHEN AT 2147 ON 09/02/20 BY KJ      Staphylococcus aureus (BCID) DETECTED (A) NOT DETECTED Final    Comment: CRITICAL RESULT CALLED TO, READ BACK BY AND VERIFIED WITH: PHARMD LYDIA CHEN AT 2147 ON 09/02/20 BY KJ      Staphylococcus epidermidis NOT DETECTED NOT DETECTED Final   Staphylococcus lugdunensis NOT DETECTED NOT DETECTED Final   Streptococcus species NOT DETECTED NOT DETECTED Final   Streptococcus agalactiae NOT DETECTED NOT DETECTED Final   Streptococcus pneumoniae NOT DETECTED NOT DETECTED Final   Streptococcus pyogenes NOT DETECTED NOT DETECTED Final   A.calcoaceticus-baumannii NOT DETECTED NOT DETECTED Final   Bacteroides fragilis NOT DETECTED NOT DETECTED Final   Enterobacterales NOT DETECTED NOT DETECTED Final   Enterobacter cloacae complex NOT DETECTED NOT DETECTED Final   Escherichia coli NOT DETECTED NOT DETECTED Final  Klebsiella aerogenes NOT DETECTED NOT DETECTED Final   Klebsiella oxytoca NOT DETECTED NOT DETECTED Final   Klebsiella pneumoniae NOT DETECTED NOT DETECTED Final   Proteus species NOT DETECTED NOT DETECTED Final   Salmonella species NOT DETECTED NOT DETECTED  Final   Serratia marcescens NOT DETECTED NOT DETECTED Final   Haemophilus influenzae NOT DETECTED NOT DETECTED Final   Neisseria meningitidis NOT DETECTED NOT DETECTED Final   Pseudomonas aeruginosa NOT DETECTED NOT DETECTED Final   Stenotrophomonas maltophilia NOT DETECTED NOT DETECTED Final   Candida albicans NOT DETECTED NOT DETECTED Final   Candida auris NOT DETECTED NOT DETECTED Final   Candida glabrata NOT DETECTED NOT DETECTED Final   Candida krusei NOT DETECTED NOT DETECTED Final   Candida parapsilosis NOT DETECTED NOT DETECTED Final   Candida tropicalis NOT DETECTED NOT DETECTED Final   Cryptococcus neoformans/gattii NOT DETECTED NOT DETECTED Final   Meth resistant mecA/C and MREJ NOT DETECTED NOT DETECTED Final    Comment: Performed at [facility], [place] [address], [place], [place] [address]  MRSA PCR Screening     Status: None   Collection Time: 09/02/20 11:16 AM  Result Value Ref Range Status   MRSA by PCR NEGATIVE NEGATIVE Final    Comment:        The GeneXpert MRSA Assay (FDA approved for NASAL specimens only), is one component of a comprehensive MRSA colonization surveillance program. It is not intended to diagnose MRSA infection nor to guide or monitor treatment for MRSA infections. Performed at [facility], [place] [address] [place], [place] [address]      Radiology Studies: ECHOCARDIOGRAM COMPLETE  Result Date: 09/03/2020    ECHOCARDIOGRAM REPORT   Patient Name:   Whitney [name] Date of Exam: 09/03/2020 Medical Rec #:  [id number]         Height:       66.0 in Accession #:    [id number]        Weight:       154.1 lb Date of Birth:  Feb 11, 1928         BSA:          1.790 m Patient Age:    45 years          BP:           101/70 mmHg Patient Gender: F                 HR:           82 bpm. Exam Location:  Inpatient Procedure: 2D Echo, Color Doppler and Cardiac Doppler Indications:    Bacteremia  History:        Patient has prior history of  Echocardiogram examinations, most                 recent 12/12/2013. Arrythmias:Atrial Fibrillation; Risk                 Factors:Former Smoker, Diabetes, Dyslipidemia and Hypertension.  Sonographer:    Bernadene Person RDCS Referring Phys: [id number] [place]  1. Left ventricular ejection fraction, by estimation, is 50 to 55%. The left ventricle has low normal function. The left ventricle has no regional wall motion abnormalities. Left ventricular diastolic function could not be evaluated.  2. Right ventricular systolic function is normal. The right ventricular size is normal. There is mildly elevated pulmonary artery systolic pressure.  3. Left atrial size was severely dilated.  4. Right atrial size was mildly dilated.  5. Moderate pleural effusion in the left lateral region.  6. The mitral valve is normal in structure. Mild mitral valve regurgitation. No evidence of mitral stenosis.  7. Tricuspid valve regurgitation is moderate.  8. The aortic valve is tricuspid. Aortic valve regurgitation is mild. Mild aortic valve sclerosis is present, with no evidence of aortic valve stenosis.  9. The inferior vena cava is normal in size with greater than 50% respiratory variability, suggesting right atrial pressure of 3 mmHg. FINDINGS  Left Ventricle: Left ventricular ejection fraction, by estimation, is 50 to 55%. The left ventricle has low normal function. The left ventricle has no regional wall motion abnormalities. The left ventricular internal cavity size was normal in size. There is no left ventricular hypertrophy. Left ventricular diastolic function could not be evaluated due to atrial fibrillation. Left ventricular diastolic function could not be evaluated. Right Ventricle: The right ventricular size is normal. Right ventricular systolic function is normal. There is mildly elevated pulmonary artery systolic pressure. The tricuspid regurgitant velocity is 3.17 m/s, and with an assumed right atrial  pressure of 3 mmHg, the estimated right ventricular systolic pressure is 47.4 mmHg. Left Atrium: Left atrial size was severely dilated. Right Atrium: Right atrial size was mildly dilated. Pericardium: Trivial pericardial effusion is present. Mitral Valve: The mitral valve is normal in structure. Mild mitral annular calcification. Mild mitral valve regurgitation. No evidence of mitral valve stenosis. Tricuspid Valve: The tricuspid valve is normal in structure. Tricuspid valve regurgitation is moderate . No evidence of tricuspid stenosis. Aortic Valve: The aortic valve is tricuspid. Aortic valve regurgitation is mild. Aortic regurgitation PHT measures 699 msec. Mild aortic valve sclerosis is present, with no evidence of aortic valve stenosis. Pulmonic Valve: The pulmonic valve was normal in structure. Pulmonic valve regurgitation is mild. No evidence of pulmonic stenosis. Aorta: The aortic root is normal in size and structure. Venous: The inferior vena cava is normal in size with greater than 50% respiratory variability, suggesting right atrial pressure of 3 mmHg. IAS/Shunts: No atrial level shunt detected by color flow Doppler. Additional Comments: There is a moderate pleural effusion in the left lateral region.  LEFT VENTRICLE PLAX 2D LVIDd:         4.50 cm LVIDs:         2.70 cm LV PW:         1.00 cm LV IVS:        0.90 cm LVOT diam:     2.10 cm LV SV:         50 LV SV Index:   28 LVOT Area:     3.46 cm  LV Volumes (MOD) LV vol d, MOD A2C: 83.4 ml LV vol d, MOD A4C: 78.7 ml LV vol s, MOD A2C: 37.5 ml LV vol s, MOD A4C: 41.5 ml LV SV MOD A2C:     45.9 ml LV SV MOD A4C:     78.7 ml LV SV MOD BP:      44.9 ml RIGHT VENTRICLE TAPSE (M-mode): 1.3 cm LEFT ATRIUM             Index       RIGHT ATRIUM           Index LA diam:        4.50 cm 2.51 cm/m  RA Area:     22.50 cm LA Vol (A2C):   93.5 ml 52.23 ml/m RA Volume:   65.80 ml  36.76 ml/m LA Vol (A4C):   96.4 ml  53.85 ml/m LA Biplane Vol: 98.2 ml 54.86 ml/m   AORTIC VALVE LVOT Vmax:   69.40 cm/s LVOT Vmean:  46.800 cm/s LVOT VTI:    0.144 m AI PHT:      699 msec  AORTA Ao Root diam: 3.30 cm Ao Asc diam:  3.70 cm TRICUSPID VALVE TR Peak grad:   40.2 mmHg TR Vmax:        317.00 cm/s  SHUNTS Systemic VTI:  0.14 m Systemic Diam: 2.10 cm Kirk Ruths MD Electronically signed by Kirk Ruths MD Signature Date/Time: 09/03/2020/3:41:53 PM    Final    VAS [place] UPPER EXTREMITY VENOUS DUPLEX  Result Date: 09/04/2020 UPPER VENOUS STUDY  Patient Name:  Whitney [name]  Date of Exam:   09/04/2020 Medical Rec #: [id number]          Accession #:    [id number] Date of Birth: 1927-04-10          Patient Gender: F Patient Age:   89Y Exam Location:  [facility] Procedure:      VAS [place] UPPER EXTREMITY VENOUS DUPLEX Referring Phys: Br[place] --------------------------------------------------------------------------------  Indications: Swelling Risk Factors: COVID 19 positive. Comparison Study: No prior studies. Performing Technologist: Oliver Hum RVT  Examination Guidelines: A complete evaluation includes B-mode imaging, spectral Doppler, color Doppler, and power Doppler as needed of all accessible portions of each vessel. Bilateral testing is considered an integral part of a complete examination. Limited examinations for reoccurring indications may be performed as noted.  Right Findings: +----------+------------+---------+-----------+----------+-------+ RIGHT     CompressiblePhasicitySpontaneousPropertiesSummary +----------+------------+---------+-----------+----------+-------+ IJV           Full       Yes       Yes                      +----------+------------+---------+-----------+----------+-------+ Subclavian    Full       Yes       Yes                      +----------+------------+---------+-----------+----------+-------+ Axillary      Full       Yes       Yes                       +----------+------------+---------+-----------+----------+-------+ Brachial      Full       Yes       Yes                      +----------+------------+---------+-----------+----------+-------+ Radial        Full                                          +----------+------------+---------+-----------+----------+-------+ Ulnar         Full                                          +----------+------------+---------+-----------+----------+-------+ Cephalic      Full                                          +----------+------------+---------+-----------+----------+-------+ Basilic  Full                                          +----------+------------+---------+-----------+----------+-------+  Left Findings: +----------+------------+---------+-----------+----------+-------+ LEFT      CompressiblePhasicitySpontaneousPropertiesSummary +----------+------------+---------+-----------+----------+-------+ Subclavian    Full       Yes       Yes                      +----------+------------+---------+-----------+----------+-------+  Summary:  Right: No evidence of deep vein thrombosis in the upper extremity. No evidence of superficial vein thrombosis in the upper extremity.  Left: No evidence of thrombosis in the subclavian.  *See table(s) above for measurements and observations.    Preliminary    VAS [place] UPPER EXTREMITY VENOUS DUPLEX      DG Chest Port 1 View  Final Result      Scheduled Meds:  amiodarone  200 mg Oral Daily   vitamin C  500 mg Oral Daily   atorvastatin  10 mg Oral Daily   dexamethasone (DECADRON) injection  6 mg Intravenous Daily   enoxaparin (LOVENOX) injection  30 mg Subcutaneous Q24H   linagliptin  5 mg Oral Daily   metoprolol succinate  100 mg Oral BID   zinc sulfate  220 mg Oral Daily   PRN Meds: acetaminophen, chlorpheniramine-HYDROcodone, guaiFENesin-dextromethorphan Continuous Infusions:  sodium chloride 75 mL/hr at 09/04/20 0506    albumin human      ceFAZolin (ANCEF) IV 2 g (09/04/20 1018)   remdesivir 100 mg in NS 100 mL 100 mg (09/04/20 1245)     LOS: 2 days  Time spent: Greater than 50% of the 35 minute visit was spent in counseling/coordination of care for the patient as laid out in the A&P.   Dwyane Dee, MD [facility] 09/04/2020, 4:19 PM

## 2020-09-04 NOTE — Progress Notes (Signed)
Right upper extremity venous duplex has been completed. Preliminary results can be found in CV Proc through chart review.   09/04/20 2:33 PM [name] RVT

## 2020-09-04 NOTE — Consult Note (Signed)
Consultation Note Date: 09/04/2020   Patient Name: Whitney [name]  DOB: 07-25-1927  MRN: [id number]  Age / Sex: 87 y.o., female   PCP: [name], MD Referring Physician: Dwyane Dee, MD   REASON FOR CONSULTATION:Establishing goals of care  Palliative Care consult requested for goals of care discussion in this 87 y.o. female with a medical history significant for diabetes, hypertension, hyperlipidemia, atrial fibrillation, diastolic CHF, and anemia. She presented to the ED from home with complaints of generalized weakness and fever. Chest x-ray showed small right pleural effusion. Subsequently was found to be COVID-19 positive. Receiving Remdesivir. Also noted to have right upper extremity cellulitis.  Patient being treated for sepsis.   Clinical Assessment and Goals of Care: I have reviewed medical records including lab results, imaging, Epic notes, and MAR, and assessed the patient.   I met at the bedside with patient to discuss diagnosis prognosis, GOC, EOL wishes, disposition and options. Conversation was deferred to her family.   I call and spoke with patient's son, Fritz Pickerel and granddaughter, [place].   I introduced Palliative Medicine as specialized medical care for people living with serious illness. It focuses on providing relief from the symptoms and stress of a serious illness. The goal is to improve quality of life for both the patient and the family.  We discussed a brief life review of the patient, along with her functional and nutritional status. Family shares patient is widowed with 4 children. She worked in a [employer] and also for [employer]. She is of [place] faith.   Prior to admission patient lived in the home with her granddaughter, [place] whom she raised. Patient required some assistance with ADLs. Ambulatory with walker or cane, however gait is unsteady. Her appetite is good. She would spend most of her day sitting in family room watching tv.   We  discussed Her current illness and what it means in the larger context of Her on-going co-morbidities. Natural disease trajectory and expectations at EOL were discussed.  A detailed discussion was had today regarding advanced directives.  Concepts specific to code status, artifical feeding and hydration, continued IV antibiotics and rehospitalization.  I discussed at length patient's full code status with consideration of her current illness, co-morbidities, and advanced age. Fritz Pickerel shares he is patient's HCPOA. However, he relies on support from his siblings and niece. He states patient has arranged burial information however family has never discussed code status, feedings, or dialysis. I discussed at length recommendations for DNR/DNI. Family verbalized understanding and expressed they will discuss further as a family. Request to remain a full code at this time.   Values and goals of care important to patient and family were attempted to be elicited.    I discussed the importance of continued conversation with family and their medical providers regarding overall plan of care and treatment options, ensuring decisions are within the context of the patients values and GOCs.  Son and granddaughter clear in expressed wishes to continue to treat the treatable. They remain hopeful for patient's continued improvement with a goal of returning home. Caryl Comes is requesting home health support if recommended.   Palliative Care services outpatient were explained and offered given expressed wishes. Patient and family verbalized their understanding and awareness of both palliative and hospice's goals and philosophy of care. They are in agreement with ongoing palliative support.   Questions and concerns were addressed.  The family was encouraged to call with questions or concerns.  PMT will  continue to support holistically as needed.   CODE STATUS: Full code  ADVANCE DIRECTIVES: Primary Decision Maker: Argie Ramming  (Son)  HCPOA: yes    SYMPTOM MANAGEMENT:  Palliative Prophylaxis:  Aspiration, Bowel Regimen, Delirium Protocol, Frequent Pain Assessment, and Oral Care  PSYCHO-SOCIAL/SPIRITUAL: Support System: Family Desire for further Chaplaincy support:no  Additional Recommendations (Limitations, Scope, Preferences): Full Scope Treatment Education on hospice/palliative    PAST MEDICAL HISTORY: Past Medical History:  Diagnosis Date   Anemia    Atrial fibrillation ([place])    Breast cancer ([place]) 10/2013   right upper outer   Cancer ([place])    right breast   Complication of anesthesia    slow to wake up   Diabetes mellitus without complication ([place])    type 2   Dysrhythmia 10/15   AF   Former smoker    Full dentures    Hyperlipidemia    Hypertension    Multinodular goiter    Osteoporosis    Radiation    Right Breast   Thyroid disease    hypothyroidism   Wears glasses     ALLERGIES:  is allergic to tape.   MEDICATIONS:  Current Facility-Administered Medications  Medication Dose Route Frequency Provider Last Rate Last Admin   0.9 %  sodium chloride infusion   Intravenous Continuous Aline August, MD 75 mL/hr at 09/04/20 0506 New Bag at 09/04/20 0506   acetaminophen (TYLENOL) tablet 650 mg  650 mg Oral Q6H PRN Chotiner, Yevonne Aline, MD       amiodarone (PACERONE) tablet 200 mg  200 mg Oral Daily Chotiner, Yevonne Aline, MD   200 mg at 09/04/20 5852   ascorbic acid (VITAMIN C) tablet 500 mg  500 mg Oral Daily Chotiner, Yevonne Aline, MD   500 mg at 09/04/20 0958   atorvastatin (LIPITOR) tablet 10 mg  10 mg Oral Daily Chotiner, Yevonne Aline, MD   10 mg at 09/04/20 0958   ceFAZolin (ANCEF) IVPB 2g/100 mL premix  2 g Intravenous Q12H Poindexter, Leann T, RPH 200 mL/hr at 09/04/20 1018 2 g at 09/04/20 1018   chlorpheniramine-HYDROcodone (TUSSIONEX) 10-8 MG/5ML suspension 5 mL  5 mL Oral Q12H PRN Chotiner, Yevonne Aline, MD       dexamethasone (DECADRON) injection 6 mg  6 mg Intravenous Daily Starla Link,  Kshitiz, MD   6 mg at 09/04/20 0958   enoxaparin (LOVENOX) injection 30 mg  30 mg Subcutaneous Q24H Chotiner, Yevonne Aline, MD   30 mg at 09/04/20 0959   guaiFENesin-dextromethorphan (ROBITUSSIN DM) 100-10 MG/5ML syrup 10 mL  10 mL Oral Q4H PRN Chotiner, Yevonne Aline, MD   10 mL at 09/02/20 7782   linagliptin (TRADJENTA) tablet 5 mg  5 mg Oral Daily Chotiner, Yevonne Aline, MD   5 mg at 09/04/20 4235   metoprolol succinate (TOPROL-XL) 24 hr tablet 100 mg  100 mg Oral BID Chotiner, Yevonne Aline, MD   100 mg at 09/04/20 1003   remdesivir 100 mg in sodium chloride 0.9 % 100 mL IVPB  100 mg Intravenous Daily Angela Adam, RPH 200 mL/hr at 09/03/20 1011 100 mg at 09/03/20 1011   zinc sulfate capsule 220 mg  220 mg Oral Daily Chotiner, Yevonne Aline, MD   220 mg at 09/04/20 1000    VITAL SIGNS: BP 109/75   Pulse 83   Temp 97.6 F (36.4 C) (Oral)   Resp 20   Ht 5' 6"  (1.676 m)   Wt 71.5 kg   LMP  (LMP Unknown)  SpO2 90%   BMI 25.44 kg/m  Filed Weights   09/02/20 0326 09/02/20 0400 09/04/20 0500  Weight: 69.1 kg 69.9 kg 71.5 kg    Estimated body mass index is 25.44 kg/m as calculated from the following:   Height as of this encounter: 5' 6"  (1.676 m).   Weight as of this encounter: 71.5 kg.  LABS: CBC:    Component Value Date/Time   WBC 7.7 09/04/2020 0057   HGB 10.8 (L) 09/04/2020 0057   HGB 9.3 (L) 12/22/2013 1000   HCT 32.0 (L) 09/04/2020 0057   HCT 29.0 (L) 12/22/2013 1000   PLT 128 (L) 09/04/2020 0057   PLT 573 (H) 12/22/2013 1000   Comprehensive Metabolic Panel:    Component Value Date/Time   NA 136 09/04/2020 0057   NA 141 12/22/2013 1000   K 5.1 09/04/2020 0057   K 3.1 (L) 12/22/2013 1000   BUN 49 (H) 09/04/2020 0057   BUN 26.0 12/22/2013 1000   CREATININE 2.44 (H) 09/04/2020 0057   CREATININE 1.64 (H) 07/12/2020 1140   CREATININE 1.3 (H) 12/22/2013 1000   ALBUMIN 2.4 (L) 09/04/2020 0057   ALBUMIN 2.6 (L) 12/22/2013 1000     Review of Systems  Neurological:  Positive  for weakness.  Unless otherwise noted, a complete review of systems is negative.  Physical Exam General: NAD, Elderly  Cardiovascular: Controlled Pulmonary: room air, diminished bilaterally  Abdomen: soft, nontender, + bowel sounds Extremities: no edema, no joint deformities Skin: no rashes, right upper extremity erythema Neurological: awake, alert, slow response    Prognosis: Guarded   Discharge Planning:  To Be Determined with outpatient Palliative support   Recommendations: Full Code-as confirmed by family. Family plans to have ongoing discussion regarding wishes and goals of care amongst themselves. States they have not discussed code status in the past.  Continue with current plan of care, treat the treatable Son and granddaughter are clear in expressed wishes to continue to treat. They are remaining hopeful for some improvement with a goal of returning home. Open to home health and outpatient palliative support.  PMT will continue to support and follow as needed. Please call team line with urgent needs.   Palliative Performance Scale:   PPS 30%              Family expressed understanding and was in agreement with this plan.   Thank you for allowing the Palliative Medicine Team to assist in the care of this patient. Please utilize secure chat with additional questions, if there is no response within 30 minutes please call the above phone number.   Time In: 1100 Time Out: 1155 Time Total: 55 min.   Visit consisted of counseling and education dealing with the complex and emotionally intense issues of symptom management and palliative care in the setting of serious and potentially life-threatening illness.Greater than 50%  of this time was spent counseling and coordinating care related to the above assessment and plan.  Signed by:  Alda Lea, AGPCNP-BC Palliative Medicine Team  Phone: [phone] Pager: [phone] Amion: [place]  Team providers are available by phone from 7am to 7pm daily and can be reached through the team cell phone.  Should this patient require assistance outside of these hours, please call the patient's attending physician.

## 2020-09-04 NOTE — Progress Notes (Signed)
ID Brief Note   Needs a TEE to r/o endocarditis  Rosiland Oz, MD Infectious Disease Physician [facility] [address] [place], [place] [address] Phone: [phone]  Fax: [phone]

## 2020-09-05 ENCOUNTER — Ambulatory Visit: Payer: Medicare Other | Admitting: Family Medicine

## 2020-09-05 DIAGNOSIS — N1832 Chronic kidney disease, stage 3b: Secondary | ICD-10-CM

## 2020-09-05 DIAGNOSIS — I480 Paroxysmal atrial fibrillation: Secondary | ICD-10-CM

## 2020-09-05 LAB — COMPREHENSIVE METABOLIC PANEL
ALT: 29 U/L (ref 0–44)
AST: 40 U/L (ref 15–41)
Albumin: 3.5 g/dL (ref 3.5–5.0)
Alkaline Phosphatase: 34 U/L — ABNORMAL LOW (ref 38–126)
Anion gap: 13 (ref 5–15)
BUN: 54 mg/dL — ABNORMAL HIGH (ref 8–23)
CO2: 16 mmol/L — ABNORMAL LOW (ref 22–32)
Calcium: 7.6 mg/dL — ABNORMAL LOW (ref 8.9–10.3)
Chloride: 109 mmol/L (ref 98–111)
Creatinine, Ser: 2.52 mg/dL — ABNORMAL HIGH (ref 0.44–1.00)
GFR, Estimated: 18 mL/min — ABNORMAL LOW (ref >60)
Glucose, Bld: 161 mg/dL — ABNORMAL HIGH (ref 70–99)
Potassium: 5.3 mmol/L — ABNORMAL HIGH (ref 3.5–5.1)
Sodium: 138 mmol/L (ref 135–145)
Total Bilirubin: 1.2 mg/dL (ref 0.3–1.2)
Total Protein: 5.5 g/dL — ABNORMAL LOW (ref 6.5–8.1)

## 2020-09-05 LAB — HEPATITIS PANEL, ACUTE
HCV Ab: NONREACTIVE
Hep A IgM: NONREACTIVE
Hep B C IgM: NONREACTIVE
Hepatitis B Surface Ag: NONREACTIVE

## 2020-09-05 LAB — D-DIMER, QUANTITATIVE: D-Dimer, Quant: 3.43 ug/mL-FEU — ABNORMAL HIGH (ref 0.00–0.50)

## 2020-09-05 LAB — GLUCOSE, CAPILLARY
Glucose-Capillary: 158 mg/dL — ABNORMAL HIGH (ref 70–99)
Glucose-Capillary: 244 mg/dL — ABNORMAL HIGH (ref 70–99)
Glucose-Capillary: 196 mg/dL — ABNORMAL HIGH (ref 70–99)
Glucose-Capillary: 189 mg/dL — ABNORMAL HIGH (ref 70–99)

## 2020-09-05 LAB — FERRITIN: Ferritin: 455 ng/mL — ABNORMAL HIGH (ref 11–307)

## 2020-09-05 LAB — MAGNESIUM: Magnesium: 2.2 mg/dL (ref 1.7–2.4)

## 2020-09-05 LAB — LACTATE DEHYDROGENASE: LDH: 220 U/L — ABNORMAL HIGH (ref 98–192)

## 2020-09-05 LAB — C-REACTIVE PROTEIN: CRP: <0.5 mg/dL (ref <1.0)

## 2020-09-05 NOTE — Progress Notes (Signed)
Progress Note    Whitney [name]   VPX:[id number]  DOB: 11-21-1927  DOA: 09/02/2020     3  PCP: [name], MD  CC: fever, weakness  Hospital Course: 87 year old female with history of paroxysmal A. fib, diabetes mellitus type 2, hypertension, hyperlipidemia, chronic diastolic CHF presented with fever and weakness.  On presentation, she was found to be febrile, tachycardic with lactic acid of 2.6, creatinine of 2.26.  Chest x-ray showed bibasilar opacities with small right pleural effusion.  She tested positive for COVID.  She was found to have right upper extremity cellulitis.  She was started on IV fluids, antibiotics, remdesivir and steroids.  She was subsequently found to have MSSA bacteremia  Interval History:  No events overnight.  Palliative care has been following and discussing goals of care with son as well.  I have been contacting her daughter also each day and discussing status. At this time, plan will be for TEE with cardiology after she is 10 days out of her positive COVID test.  She will remain in hospital until then.  ROS: Constitutional: negative for chills and fevers, Respiratory: negative for cough, Cardiovascular: negative for chest pain, and Gastrointestinal: negative for abdominal pain  Assessment & Plan:  Severe Sepsis MSSA bacteremia Right upper extremity cellulitis Presented with fever, tachycardia, lactic acidosis, acute kidney injury with right upper extremity cellulitis and COVID-pneumonia. -now has MSSA bacteremia.  ID has been also consulted.  Antibiotics has been switched to IV Ancef. -TTE negative; TEE recommended per ID; cardiology consulted.  TEE not to be performed until patient 10 days out of COVID test per cardiology   COVID-19 pneumonia -Respiratory status stable.  Currently on room air.  Continue remdesivir and Decadron -Monitor daily inflammatory markers  Diabetes mellitus type 2 with hyperglycemia -A1c 5.9 on 07/19/2020.  Continue  linagliptin.  CBGs with SSI   Essential hypertension -Blood pressure currently on the lower side.  Monitor.  Continue metoprolol  Acute kidney injury on CKD stage IIIb -Presented with creatinine of 2.31.  Baseline creatinine around 1.6-1.8.  - Continue IV fluids; add albumin   Chronic diastolic CHF -Stable.  Strict input and output.  Daily weights.   -Be cautious with fluids.   Paroxysmal A. Fib -Currently rate controlled.  Continue amiodarone and metoprolol. -Not on Eliquis anymore because of rectal bleeding in the past  Prolonged QT interval -Avoid QT intervals prolonging medications.  Monitor electrolytes   Generalized deconditioning -PT eval -Palliative care consultation for goals of care discussion   Chronic macrocytic anemia - likely mixed 2/2 ACD -No signs of bleeding.  Monitor   Thrombocytopenia -No signs of bleeding.  Monitor  Old records reviewed in assessment of this patient  Antimicrobials: Cefepime 6/19 >> 6/20 Ancef 6/20 >> current Remdesivir 6/20 >> current   DVT prophylaxis: enoxaparin (LOVENOX) injection 30 mg Start: 09/02/20 1000   Code Status:   Code Status: Full Code Family Communication: daughter  Disposition Plan: Status is: Inpatient  Remains inpatient appropriate because:Ongoing diagnostic testing needed not appropriate for outpatient work up, IV treatments appropriate due to intensity of illness or inability to take PO, and Inpatient level of care appropriate due to severity of illness  Dispo: The patient is from: SNF              Anticipated d/c is to: SNF              Patient currently is not medically stable to d/c.   Difficult to place patient  No  Risk of unplanned readmission score: Unplanned Admission- Pilot do not use: 33.78   Objective: Blood pressure 106/76, pulse 78, temperature (!) 97.4 F (36.3 C), temperature source Axillary, resp. rate 20, height 5\' 6"  (1.676 m), weight 71.5 kg, SpO2 100 %.  Examination: General  appearance:  Difficulty with hearing, slowed mentation, no distress Head: Normocephalic, without obvious abnormality, atraumatic Eyes:  EOMI Lungs:  Bibasilar crackles, no wheezing Heart: regular rate and rhythm and S1, S2 normal Abdomen: normal findings: bowel sounds normal and soft, non-tender Extremities:  2-3+ pitting edema in right upper extremity with erythema appreciated, no significant tenderness.  3+ bilateral lower extremity pitting edema also appreciated Skin: mobility and turgor normal Neurologic: Grossly normal  Consultants:  ID Cardiology Palliative care  Procedures:    Data Reviewed: I have personally reviewed following labs and imaging studies Results for orders placed or performed during the hospital encounter of 08/27/2020 (from the past 24 hour(s))  Glucose, capillary     Status: Abnormal   Collection Time: 09/04/20  8:29 PM  Result Value Ref Range   Glucose-Capillary 224 (H) 70 - 99 mg/dL  Glucose, capillary     Status: Abnormal   Collection Time: 09/05/20  8:14 AM  Result Value Ref Range   Glucose-Capillary 158 (H) 70 - 99 mg/dL  C-reactive protein     Status: None   Collection Time: 09/05/20  8:47 AM  Result Value Ref Range   CRP <0.5 <1.0 mg/dL  Comprehensive metabolic panel     Status: Abnormal   Collection Time: 09/05/20  8:47 AM  Result Value Ref Range   Sodium 138 135 - 145 mmol/L   Potassium 5.3 (H) 3.5 - 5.1 mmol/L   Chloride 109 98 - 111 mmol/L   CO2 16 (L) 22 - 32 mmol/L   Glucose, Bld 161 (H) 70 - 99 mg/dL   BUN 54 (H) 8 - 23 mg/dL   Creatinine, Ser 2.52 (H) 0.44 - 1.00 mg/dL   Calcium 7.6 (L) 8.9 - 10.3 mg/dL   Total Protein 5.5 (L) 6.5 - 8.1 g/dL   Albumin 3.5 3.5 - 5.0 g/dL   AST 40 15 - 41 U/L   ALT 29 0 - 44 U/L   Alkaline Phosphatase 34 (L) 38 - 126 U/L   Total Bilirubin 1.2 0.3 - 1.2 mg/dL   GFR, Estimated 18 (L) >60 mL/min   Anion gap 13 5 - 15  Ferritin     Status: Abnormal   Collection Time: 09/05/20  8:47 AM  Result Value  Ref Range   Ferritin 455 (H) 11 - 307 ng/mL  Hepatitis panel, acute     Status: None   Collection Time: 09/05/20  8:47 AM  Result Value Ref Range   Hepatitis B Surface Ag NON REACTIVE NON REACTIVE   HCV Ab NON REACTIVE NON REACTIVE   Hep A IgM NON REACTIVE NON REACTIVE   Hep B C IgM NON REACTIVE NON REACTIVE  Magnesium     Status: None   Collection Time: 09/05/20  8:47 AM  Result Value Ref Range   Magnesium 2.2 1.7 - 2.4 mg/dL  Lactate dehydrogenase     Status: Abnormal   Collection Time: 09/05/20  8:47 AM  Result Value Ref Range   LDH 220 (H) 98 - 192 U/L  D-dimer, quantitative     Status: Abnormal   Collection Time: 09/05/20  8:47 AM  Result Value Ref Range   D-Dimer, Quant 3.43 (H) 0.00 - 0.50 ug/mL-FEU  Glucose, capillary     Status: Abnormal   Collection Time: 09/05/20 11:12 AM  Result Value Ref Range   Glucose-Capillary 244 (H) 70 - 99 mg/dL    Recent Results (from the past 240 hour(s))  Resp Panel by RT-PCR (Flu A&B, Covid) Nasopharyngeal Swab     Status: Abnormal   Collection Time: 08/28/2020 10:45 PM   Specimen: Nasopharyngeal Swab; Nasopharyngeal(NP) swabs in vial transport medium  Result Value Ref Range Status   SARS Coronavirus 2 by RT PCR POSITIVE (A) NEGATIVE Final    Comment: RESULT CALLED TO, READ BACK BY AND VERIFIED WITH: HALEY SHEPHARD AT 0016 ON 09/02/20 BY MAJ (NOTE) SARS-CoV-2 target nucleic acids are DETECTED.  The SARS-CoV-2 RNA is generally detectable in upper respiratory specimens during the acute phase of infection. Positive results are indicative of the presence of the identified virus, but do not rule out bacterial infection or co-infection with other pathogens not detected by the test. Clinical correlation with patient history and other diagnostic information is necessary to determine patient infection status. The expected result is Negative.  Fact Sheet for Patients: EntrepreneurPulse.com.au  Fact Sheet for Healthcare  Providers: IncredibleEmployment.be  This test is not yet approved or cleared by the [place] FDA and  has been authorized for detection and/or diagnosis of SARS-CoV-2 by FDA under an Emergency Use Authorization (EUA).  This EUA will remain in effect (meaning this tes t can be used) for the duration of  the COVID-19 declaration under Section 564(b)(1) of the Act, 21 U.S.C. section 360bbb-3(b)(1), unless the authorization is terminated or revoked sooner.     Influenza A by PCR NEGATIVE NEGATIVE Final   Influenza B by PCR NEGATIVE NEGATIVE Final    Comment: (NOTE) The Xpert Xpress SARS-CoV-2/FLU/RSV plus assay is intended as an aid in the diagnosis of influenza from Nasopharyngeal swab specimens and should not be used as a sole basis for treatment. Nasal washings and aspirates are unacceptable for Xpert Xpress SARS-CoV-2/FLU/RSV testing.  Fact Sheet for Patients: EntrepreneurPulse.com.au  Fact Sheet for Healthcare Providers: IncredibleEmployment.be  This test is not yet approved or cleared by the [place] FDA and has been authorized for detection and/or diagnosis of SARS-CoV-2 by FDA under an Emergency Use Authorization (EUA). This EUA will remain in effect (meaning this test can be used) for the duration of the COVID-19 declaration under Section 564(b)(1) of the Act, 21 U.S.C. section 360bbb-3(b)(1), unless the authorization is terminated or revoked.  Performed at [facility], [place] [address], [place], [place] [address]   Blood Culture (routine x 2)     Status: Abnormal   Collection Time: 08/29/2020 10:45 PM   Specimen: BLOOD  Result Value Ref Range Status   Specimen Description   Final    BLOOD LEFT ANTECUBITAL Performed at [facility] [address] [place], [place] [address]    Special Requests   Final    BOTTLES DRAWN AEROBIC AND ANAEROBIC Blood Culture adequate  volume Performed at [facility] [address] [place], [place] [address]    Culture  Setup Time   Final    GRAM POSITIVE COCCI IN BOTH AEROBIC AND ANAEROBIC BOTTLES CRITICAL RESULT CALLED TO, READ BACK BY AND VERIFIED WITH: PHARMD LYDIA CHEN AT 2147 ON 09/02/20 BY KJ Performed at [facility], [place] [address], [place], [place] [address]    Culture STAPHYLOCOCCUS AUREUS (A)  Final   Report Status 09/04/2020 FINAL  Final   Organism ID, Bacteria  STAPHYLOCOCCUS AUREUS  Final      Susceptibility   Staphylococcus aureus - MIC*    CIPROFLOXACIN <=0.5 SENSITIVE Sensitive     ERYTHROMYCIN <=0.25 SENSITIVE Sensitive     GENTAMICIN <=0.5 SENSITIVE Sensitive     OXACILLIN <=0.25 SENSITIVE Sensitive     TETRACYCLINE <=1 SENSITIVE Sensitive     VANCOMYCIN <=0.5 SENSITIVE Sensitive     TRIMETH/SULFA <=10 SENSITIVE Sensitive     CLINDAMYCIN <=0.25 SENSITIVE Sensitive     RIFAMPIN <=0.5 SENSITIVE Sensitive     Inducible Clindamycin NEGATIVE Sensitive     * STAPHYLOCOCCUS AUREUS  Blood Culture (routine x 2)     Status: None (Preliminary result)   Collection Time: 09/11/2020 10:45 PM   Specimen: BLOOD  Result Value Ref Range Status   Specimen Description   Final    BLOOD BLOOD LEFT FOREARM Performed at [facility] [address] [place], [place] [address]    Special Requests   Final    BOTTLES DRAWN AEROBIC AND ANAEROBIC Blood Culture adequate volume Performed at [facility] [address], [place], [place] [address]    Culture   Final    NO GROWTH 3 DAYS Performed at [facility], [place] [address], [place], [place] [address]    Report Status PENDING  Incomplete  Urine culture     Status: None   Collection Time: 08/18/2020 10:45 PM   Specimen: In/Out Cath Urine  Result Value Ref Range Status   Specimen Description   Final    IN/OUT CATH URINE Performed at [facility] [address],  [place], [place] [address]    Special Requests   Final    NONE Performed at [facility], [place] [address], [place], [place] [address]    Culture   Final    NO GROWTH Performed at [facility], [place] [address] [place], [place] [address]    Report Status 09/03/2020 FINAL  Final  Blood Culture ID Panel (Reflexed)     Status: Abnormal   Collection Time: 08/21/2020 10:45 PM  Result Value Ref Range Status   Enterococcus faecalis NOT DETECTED NOT DETECTED Final   Enterococcus Faecium NOT DETECTED NOT DETECTED Final   Listeria monocytogenes NOT DETECTED NOT DETECTED Final   Staphylococcus species DETECTED (A) NOT DETECTED Final    Comment: CRITICAL RESULT CALLED TO, READ BACK BY AND VERIFIED WITH: PHARMD LYDIA CHEN AT 2147 ON 09/02/20 BY KJ      Staphylococcus aureus (BCID) DETECTED (A) NOT DETECTED Final    Comment: CRITICAL RESULT CALLED TO, READ BACK BY AND VERIFIED WITH: PHARMD LYDIA CHEN AT 2147 ON 09/02/20 BY KJ      Staphylococcus epidermidis NOT DETECTED NOT DETECTED Final   Staphylococcus lugdunensis NOT DETECTED NOT DETECTED Final   Streptococcus species NOT DETECTED NOT DETECTED Final   Streptococcus agalactiae NOT DETECTED NOT DETECTED Final   Streptococcus pneumoniae NOT DETECTED NOT DETECTED Final   Streptococcus pyogenes NOT DETECTED NOT DETECTED Final   A.calcoaceticus-baumannii NOT DETECTED NOT DETECTED Final   Bacteroides fragilis NOT DETECTED NOT DETECTED Final   Enterobacterales NOT DETECTED NOT DETECTED Final   Enterobacter cloacae complex NOT DETECTED NOT DETECTED Final   Escherichia coli NOT DETECTED NOT DETECTED Final   Klebsiella aerogenes NOT DETECTED NOT DETECTED Final   Klebsiella oxytoca NOT DETECTED NOT DETECTED Final   Klebsiella pneumoniae NOT DETECTED NOT DETECTED Final   Proteus species NOT DETECTED NOT DETECTED Final  Salmonella species NOT DETECTED NOT DETECTED Final   Serratia marcescens NOT DETECTED NOT DETECTED Final    Haemophilus influenzae NOT DETECTED NOT DETECTED Final   Neisseria meningitidis NOT DETECTED NOT DETECTED Final   Pseudomonas aeruginosa NOT DETECTED NOT DETECTED Final   Stenotrophomonas maltophilia NOT DETECTED NOT DETECTED Final   Candida albicans NOT DETECTED NOT DETECTED Final   Candida auris NOT DETECTED NOT DETECTED Final   Candida glabrata NOT DETECTED NOT DETECTED Final   Candida krusei NOT DETECTED NOT DETECTED Final   Candida parapsilosis NOT DETECTED NOT DETECTED Final   Candida tropicalis NOT DETECTED NOT DETECTED Final   Cryptococcus neoformans/gattii NOT DETECTED NOT DETECTED Final   Meth resistant mecA/C and MREJ NOT DETECTED NOT DETECTED Final    Comment: Performed at [facility], [address] [place], [place] [address]  MRSA PCR Screening     Status: None   Collection Time: 09/02/20 11:16 AM  Result Value Ref Range Status   MRSA by PCR NEGATIVE NEGATIVE Final    Comment:        The GeneXpert MRSA Assay (FDA approved for NASAL specimens only), is one component of a comprehensive MRSA colonization surveillance program. It is not intended to diagnose MRSA infection nor to guide or monitor treatment for MRSA infections. Performed at [facility], [place] [address] [place], [place] [address]   Culture, blood (routine x 2)     Status: None (Preliminary result)   Collection Time: 09/04/20  1:24 AM   Specimen: BLOOD  Result Value Ref Range Status   Specimen Description   Final    BLOOD SPECIMEN SOURCE NOT MARKED ON REQUISITION Performed at [facility] [address] [place], [place] [address]    Special Requests   Final    BOTTLES DRAWN AEROBIC ONLY Blood Culture results may not be optimal due to an inadequate volume of blood received in culture bottles Performed at [facility] [address], [place], [place] [address]    Culture   Final    NO GROWTH 1 DAY Performed at [facility],  [place] [address] [place], [place] [address]    Report Status PENDING  Incomplete  Culture, blood (routine x 2)     Status: None (Preliminary result)   Collection Time: 09/04/20  1:24 AM   Specimen: BLOOD  Result Value Ref Range Status   Specimen Description   Final    BLOOD SPECIMEN SOURCE NOT MARKED ON REQUISITION Performed at [facility] [address] [place], [place] [address]    Special Requests   Final    BOTTLES DRAWN AEROBIC ONLY Blood Culture results may not be optimal due to an inadequate volume of blood received in culture bottles Performed at [facility] [address] [place], [place] [address]    Culture   Final    NO GROWTH 1 DAY Performed at [facility], [place] [address], [place],  [address]    Report Status PENDING  Incomplete     Radiology Studies: VAS [place] UPPER EXTREMITY VENOUS DUPLEX  Result Date: 09/05/2020 UPPER VENOUS STUDY  Patient Name:  MAYRE BURY  Date of Exam:   09/04/2020 Medical Rec #: [id number]          Accession #:    [id number] Date of Birth: September 16, 1927          Patient Gender: F Patient Age:   73Y Exam Location:  [facility] Procedure:      VAS [place] UPPER EXTREMITY VENOUS DUPLEX Referring Phys: [place] --------------------------------------------------------------------------------  Indications: Swelling Risk Factors: COVID 19 positive. Comparison Study: No prior studies. Performing Technologist: Oliver Hum RVT  Examination Guidelines: A complete evaluation includes B-mode imaging, spectral Doppler, color Doppler, and power Doppler as needed of all accessible portions of each vessel. Bilateral testing is considered an integral part of a complete examination. Limited examinations for reoccurring indications may be performed as noted.  Right Findings: +----------+------------+---------+-----------+----------+-------+ RIGHT     CompressiblePhasicitySpontaneousPropertiesSummary  +----------+------------+---------+-----------+----------+-------+ IJV           Full       Yes       Yes                      +----------+------------+---------+-----------+----------+-------+ Subclavian    Full       Yes       Yes                      +----------+------------+---------+-----------+----------+-------+ Axillary      Full       Yes       Yes                      +----------+------------+---------+-----------+----------+-------+ Brachial      Full       Yes       Yes                      +----------+------------+---------+-----------+----------+-------+ Radial        Full                                          +----------+------------+---------+-----------+----------+-------+ Ulnar         Full                                          +----------+------------+---------+-----------+----------+-------+ Cephalic      Full                                          +----------+------------+---------+-----------+----------+-------+ Basilic       Full                                          +----------+------------+---------+-----------+----------+-------+  Left Findings: +----------+------------+---------+-----------+----------+-------+ LEFT      CompressiblePhasicitySpontaneousPropertiesSummary +----------+------------+---------+-----------+----------+-------+ Subclavian    Full       Yes       Yes                      +----------+------------+---------+-----------+----------+-------+  Summary:  Right: No evidence of deep vein thrombosis in the upper extremity. No evidence of superficial vein thrombosis in the upper extremity.  Left: No evidence of thrombosis in the subclavian.  *See table(s) above for measurements and observations.  Diagnosing physician: Jamelle Haring Electronically signed by Jamelle Haring on 09/05/2020 at 12:26:54 PM.    Final    VAS [place] UPPER EXTREMITY VENOUS DUPLEX  Final Result  DG Chest Port 1 View  Final  Result      Scheduled Meds:  amiodarone  200 mg Oral Daily   vitamin C  500 mg Oral Daily   atorvastatin  10 mg Oral Daily   dexamethasone (DECADRON) injection  6 mg Intravenous Daily   enoxaparin (LOVENOX) injection  30 mg Subcutaneous Q24H   insulin aspart  0-15 Units Subcutaneous TID WC   insulin aspart  0-5 Units Subcutaneous QHS   linagliptin  5 mg Oral Daily   metoprolol succinate  100 mg Oral BID   zinc sulfate  220 mg Oral Daily   PRN Meds: acetaminophen, chlorpheniramine-HYDROcodone, guaiFENesin-dextromethorphan Continuous Infusions:  sodium chloride 75 mL/hr at 09/04/20 0506   albumin human 25 g (09/05/20 1424)    ceFAZolin (ANCEF) IV 2 g (09/05/20 1016)   remdesivir 100 mg in NS 100 mL 100 mg (09/05/20 0856)     LOS: 3 days  Time spent: Greater than 50% of the 35 minute visit was spent in counseling/coordination of care for the patient as laid out in the A&P.   Dwyane Dee, MD [facility] 09/05/2020, 4:17 PM

## 2020-09-05 NOTE — Progress Notes (Signed)
Physical Therapy Treatment Patient Details Name: Whitney [name] MRN: [id number] DOB: [birth year] Today's Date: 09/05/2020    History of Present Illness 87 year old female with history of paroxysmal A. fib, diabetes mellitus type 2, hypertension, hyperlipidemia, chronic diastolic CHF presented with fever and weakness.  On presentation, she was found to be febrile, tachycardic with lactic acid of 2.6, creatinine of 2.26.  Chest x-ray showed bibasilar opacities with small right pleural effusion.  She tested positive for COVID.  She was found to have right upper extremity cellulitis.  She was started on IV fluids, antibiotics, remdesivir and steroids.  She was subsequently found to have MSSA bacteremia.    PT Comments    Pt agreeable to sit EOB, requiring multimodal cues and mod A to upright trunk into sitting and scoot out to EOB with use of bedpad. Once sitting, pt cued through seated BLE strengthening exercises. Pt perseverating on watching TV throughout session, declines ambulation and requests return to supine requiring mod A to lift BLE up. Pt requires mod A to roll R and L to adjust bed pad under pt and max A +2 to scoot up in bed and position to comfort with nurse assisting. Pt reports feeling "winded" with 2/4 dyspnea, SpO2 >90% on RA. Educated pt on pursed lip breathing and rest breaks as needed. Will continue acute PT as able.   Follow Up Recommendations  SNF     Equipment Recommendations  None recommended by PT    Recommendations for Other Services       Precautions / Restrictions Precautions Precautions: Fall Restrictions Weight Bearing Restrictions: No    Mobility  Bed Mobility Overal bed mobility: Needs Assistance Bed Mobility: Supine to Sit;Sit to Supine;Rolling Rolling: Mod assist  Supine to sit: Mod assist;HOB elevated Sit to supine: Mod assist   General bed mobility comments: pt inches LEs over to EOB, mod A to upright trunk into sitting; mod A to lift BLE back  into bed; mod A to roll R and L with therapist assisting pt's LE into hooklying and multimodal cues for UE reaching across body to adjust bed pad, max A +2 to scoot up in bed and reposition to comfort    Transfers  General transfer comment: pt declines, perseverating on watching TV  Ambulation/Gait  General Gait Details: pt declines, perseverating on watching TV   Stairs             Wheelchair Mobility    Modified Rankin (Stroke Patients Only)       Balance Overall balance assessment: Needs assistance;History of Falls Sitting-balance support: Feet supported;Single extremity supported Sitting balance-Leahy Scale: Poor Sitting balance - Comments: requires single UE support at EOB         Cognition Arousal/Alertness: Awake/alert Behavior During Therapy: WFL for tasks assessed/performed Overall Cognitive Status: Within Functional Limits for tasks assessed  General Comments: Pt very HOH, requires repeated cues often, perseverating on watching TV despite redirection      Exercises General Exercises - Lower Extremity Long Arc Quad: Seated;AROM;Strengthening;Both;10 reps Hip Flexion/Marching: Seated;AROM;Strengthening;Both;10 reps    General Comments General comments (skin integrity, edema, etc.): Pt on RA with SpO2 >90% during session, dyspnea 2/4      Pertinent Vitals/Pain Pain Assessment: No/denies pain    Home Living                      Prior Function            PT Goals (current goals can now be  found in the care plan section) Acute Rehab PT Goals PT Goal Formulation: With patient Time For Goal Achievement: 09/17/20 Potential to Achieve Goals: Good Progress towards PT goals: Progressing toward goals    Frequency    Min 2X/week      PT Plan Current plan remains appropriate    Co-evaluation              AM-PAC PT "6 Clicks" Mobility   Outcome Measure  Help needed turning from your back to your side while in a flat bed without  using bedrails?: A Lot Help needed moving from lying on your back to sitting on the side of a flat bed without using bedrails?: A Lot Help needed moving to and from a bed to a chair (including a wheelchair)?: A Lot Help needed standing up from a chair using your arms (e.g., wheelchair or bedside chair)?: Total Help needed to walk in hospital room?: Total Help needed climbing 3-5 steps with a railing? : Total 6 Click Score: 9    End of Session   Activity Tolerance: Patient limited by fatigue;Other (comment) (perseverating on watching TV) Patient left: in bed;with call bell/phone within reach;with bed alarm set Nurse Communication: Mobility status PT Visit Diagnosis: Other abnormalities of gait and mobility (R26.89)     Time: 6803-2122 PT Time Calculation (min) (ACUTE ONLY): 24 min  Charges:  $Therapeutic Exercise: 8-22 mins $Therapeutic Activity: 8-22 mins                      Tori Emani Morad PT, DPT 09/05/20, 4:33 PM

## 2020-09-05 NOTE — TOC Progression Note (Signed)
Transition of Care [facility]) - Progression Note    Patient Details  Name: Whitney [name] MRN: [id number] Date of Birth: 06-16-1927  [facility]) CM/SW Contact  Purcell Mouton, RN Phone Number: 09/05/2020, 5:20 PM  Clinical Narrative:    TOC will continue to follow pt for discharge. COVID + on 6/19 to go to a SNF will need 10 days out from [place], SNF will take pt on day 11. Which is 7/4 day 11.    Expected Discharge Plan: [place] Barriers to Discharge: No Barriers Identified  Expected Discharge Plan and Services Expected Discharge Plan: [place] arrangements for the past 2 months: Single Family Home                                       Social Determinants of Health (SDOH) Interventions    Readmission Risk Interventions Readmission Risk Prevention Plan 10/27/2019  Transportation Screening Complete  PCP or Specialist Appt within 3-5 Days Complete  HRI or [place] Complete  Social Work Consult for [place] Planning/Counseling Complete  Palliative Care Screening Not Applicable  Medication Review [employer]) Complete  Some recent data might be hidden

## 2020-09-05 NOTE — Plan of Care (Signed)
  Problem: Education: Goal: Knowledge of risk factors and measures for prevention of condition will improve Outcome: Progressing   Problem: Coping: Goal: Psychosocial and spiritual needs will be supported Outcome: Progressing   Problem: Respiratory: Goal: Will maintain a patent airway Outcome: Progressing Goal: Complications related to the disease process, condition or treatment will be avoided or minimized Outcome: Progressing   

## 2020-09-05 NOTE — Progress Notes (Signed)
ID Brief Note   TTE with no vegetations RUE [place] with no DVT TEE is pending  Blood cx 6/22 NG in 1 day   Continue cefazolin as is Monitor CBC and BMP on IV antibiotics  Rosiland Oz, MD Infectious Disease Physician [facility] [address] [place], [place] [address] Phone: [phone]  Fax: [phone]

## 2020-09-05 NOTE — Progress Notes (Signed)
   Daily Progress Note   Patient Name: Whitney [name]       Date: 09/05/2020 DOB: 24-Apr-1927  Age: 87 y.o. MRN#: [id number] Attending Physician: Dwyane Dee, MD Primary Care Physician: [name], MD Admit Date: 08/29/2020  Reason for Consultation/Follow-up: Establishing goals of care  Subjective: Chart Reviewed. Updates Received. Patient Assessed.   Spoke with patient's son. Updates provided. We reviewed goals of care discussion. States he also provided updates to his siblings with assistance from [place].   Son confirms family is remaining hopeful for patient's improvement. States he wishes to continue with all recommended work-up to allow her an opportunity to thrive and improve.   I created space and opportunity to further discuss patient's full code status. Mr. Blust states family plans to discuss this further but prefers to wait until patient is home and can be present. He understands that in the event of a cardiopulmonary event patient would receive heroic measures. He verbalized understanding and expressed he hopes this does not happen before they all have a chance to further discuss but understands if an event was to occur family would need to make decisions at that time.   Confirms wishes for ongoing palliative support at discharge. All questions answered and support provided.   Length of Stay: 3 days  Vital Signs: BP 110/76 (BP Location: Right Arm)   Pulse 70   Temp (!) 97.2 F (36.2 C) (Oral)   Resp 18   Ht 5\' 6"  (1.676 m)   Wt 71.5 kg   LMP  (LMP Unknown)   SpO2 100%   BMI 25.44 kg/m  SpO2: SpO2: 100 % O2 Device: O2 Device: Nasal Cannula O2 Flow Rate:    The above conversation was completed via telephone due to the visitor restrictions during the COVID-19 pandemic. Thorough chart review and discussion with necessary members of the care team was completed as part of assessment. All issues were discussed and addressed but no physical exam was performed.    Palliative Care Assessment & Plan  HPI: Palliative Care consult requested for goals of care discussion in this 87 y.o. female with a medical history significant for diabetes, hypertension, hyperlipidemia, atrial fibrillation, diastolic CHF, and anemia. She presented to the ED from home with complaints of generalized weakness and fever. Chest x-ray showed small right pleural effusion. Subsequently was found to be COVID-19 positive. Receiving Remdesivir. Also noted to have right upper extremity cellulitis.  Patient being treated for sepsis.  Code Status: Full code  Goals of Care/Recommendations: Continue with current plan of care per attending Family is remaining hopeful for improvement/stability. They are open to SNF if recommended but ultimate goal is for patient to return home with family support.  Outpatient palliative support at discharge.  PMT will continue to support and follow as needed.   Prognosis: Guarded   Discharge Planning: To Be Determined  Thank you for allowing the Palliative Medicine Team to assist in the care of this patient.  Time Total: 40 min.   Visit consisted of counseling and education dealing with the complex and emotionally intense issues of symptom management and palliative care in the setting of serious and potentially life-threatening illness.Greater than 50%  of this time was spent counseling and coordinating care related to the above assessment and plan.  Alda Lea, AGPCNP-BC  Palliative Medicine Team [phone]

## 2020-09-06 ENCOUNTER — Inpatient Hospital Stay (HOSPITAL_COMMUNITY): Payer: Medicare Other

## 2020-09-06 DIAGNOSIS — A419 Sepsis, unspecified organism: Secondary | ICD-10-CM

## 2020-09-06 DIAGNOSIS — I469 Cardiac arrest, cause unspecified: Secondary | ICD-10-CM

## 2020-09-06 DIAGNOSIS — R6520 Severe sepsis without septic shock: Secondary | ICD-10-CM

## 2020-09-06 LAB — COMPREHENSIVE METABOLIC PANEL
ALT: 11 U/L (ref 0–44)
AST: 37 U/L (ref 15–41)
Albumin: 3.6 g/dL (ref 3.5–5.0)
Alkaline Phosphatase: 38 U/L (ref 38–126)
Anion gap: 15 (ref 5–15)
BUN: 59 mg/dL — ABNORMAL HIGH (ref 8–23)
CO2: 14 mmol/L — ABNORMAL LOW (ref 22–32)
Calcium: 7.6 mg/dL — ABNORMAL LOW (ref 8.9–10.3)
Chloride: 110 mmol/L (ref 98–111)
Creatinine, Ser: 2.8 mg/dL — ABNORMAL HIGH (ref 0.44–1.00)
GFR, Estimated: 16 mL/min — ABNORMAL LOW (ref >60)
Glucose, Bld: 132 mg/dL — ABNORMAL HIGH (ref 70–99)
Potassium: 4.6 mmol/L (ref 3.5–5.1)
Sodium: 139 mmol/L (ref 135–145)
Total Bilirubin: 1 mg/dL (ref 0.3–1.2)
Total Protein: 5.6 g/dL — ABNORMAL LOW (ref 6.5–8.1)

## 2020-09-06 LAB — FERRITIN: Ferritin: 267 ng/mL (ref 11–307)

## 2020-09-06 LAB — C-REACTIVE PROTEIN: CRP: 1.1 mg/dL — ABNORMAL HIGH (ref <1.0)

## 2020-09-06 LAB — CBC WITH DIFFERENTIAL/PLATELET
Abs Immature Granulocytes: 0.04 10*3/uL (ref 0.00–0.07)
Basophils Absolute: 0 10*3/uL (ref 0.0–0.1)
Basophils Relative: 0 %
Eosinophils Absolute: 0 10*3/uL (ref 0.0–0.5)
Eosinophils Relative: 0 %
HCT: 27.8 % — ABNORMAL LOW (ref 36.0–46.0)
Hemoglobin: 9.1 g/dL — ABNORMAL LOW (ref 12.0–15.0)
Immature Granulocytes: 1 %
Lymphocytes Relative: 3 %
Lymphs Abs: 0.2 10*3/uL — ABNORMAL LOW (ref 0.7–4.0)
MCH: 34.7 pg — ABNORMAL HIGH (ref 26.0–34.0)
MCHC: 32.7 g/dL (ref 30.0–36.0)
MCV: 106.1 fL — ABNORMAL HIGH (ref 80.0–100.0)
Monocytes Absolute: 0.4 10*3/uL (ref 0.1–1.0)
Monocytes Relative: 5 %
Neutro Abs: 6.6 10*3/uL (ref 1.7–7.7)
Neutrophils Relative %: 91 %
Platelets: 173 10*3/uL (ref 150–400)
RBC: 2.62 MIL/uL — ABNORMAL LOW (ref 3.87–5.11)
RDW: 16.8 % — ABNORMAL HIGH (ref 11.5–15.5)
WBC: 7.2 10*3/uL (ref 4.0–10.5)
nRBC: 0.3 % — ABNORMAL HIGH (ref 0.0–0.2)

## 2020-09-06 LAB — GLUCOSE, CAPILLARY
Glucose-Capillary: 158 mg/dL — ABNORMAL HIGH (ref 70–99)
Glucose-Capillary: 162 mg/dL — ABNORMAL HIGH (ref 70–99)

## 2020-09-06 LAB — MAGNESIUM: Magnesium: 2 mg/dL (ref 1.7–2.4)

## 2020-09-06 LAB — BLOOD GAS, ARTERIAL
Acid-base deficit: 15.5 mmol/L — ABNORMAL HIGH (ref 0.0–2.0)
Bicarbonate: 14.8 mmol/L — ABNORMAL LOW (ref 20.0–28.0)
O2 Saturation: 96.1 %
Patient temperature: 98.6
pCO2 arterial: 61.5 mmHg — ABNORMAL HIGH (ref 32.0–48.0)
pH, Arterial: 7.012 — CL (ref 7.350–7.450)
pO2, Arterial: 120 mmHg — ABNORMAL HIGH (ref 83.0–108.0)

## 2020-09-06 LAB — D-DIMER, QUANTITATIVE: D-Dimer, Quant: 3.02 ug/mL-FEU — ABNORMAL HIGH (ref 0.00–0.50)

## 2020-09-06 LAB — LACTATE DEHYDROGENASE: LDH: 158 U/L (ref 98–192)

## 2020-09-06 MED ORDER — MORPHINE 100MG IN NS 100ML (1MG/ML) PREMIX INFUSION
1.0000 mg/h | INTRAVENOUS | Status: DC
  Administered 2020-09-06: 2.5 mg/h via INTRAVENOUS
  Filled 2020-09-06: qty 100

## 2020-09-06 MED ORDER — EPINEPHRINE HCL 5 MG/250ML IV SOLN IN NS
0.5000 ug/min | INTRAVENOUS | Status: DC
Start: 2020-09-06 — End: 2020-09-06
  Administered 2020-09-06: 0.5 ug/min via INTRAVENOUS

## 2020-09-06 MED ORDER — NOREPINEPHRINE 4 MG/250ML-% IV SOLN
0.0000 ug/min | INTRAVENOUS | Status: DC
  Filled 2020-09-06: qty 250

## 2020-09-06 MED ORDER — LORAZEPAM 2 MG/ML IJ SOLN: 2.0000 mg | Freq: Once | INTRAMUSCULAR | Status: AC

## 2020-09-06 MED ORDER — CHLORHEXIDINE GLUCONATE CLOTH 2 % EX PADS: 6.0000 | MEDICATED_PAD | Freq: Every day | CUTANEOUS | Status: DC

## 2020-09-06 MED ORDER — LORAZEPAM 2 MG/ML IJ SOLN
INTRAMUSCULAR | Status: AC
  Filled 2020-09-06: qty 1

## 2020-09-06 MED ORDER — CHLORHEXIDINE GLUCONATE 0.12% ORAL RINSE (MEDLINE KIT): 15.0000 mL | Freq: Two times a day (BID) | OROMUCOSAL | Status: DC

## 2020-09-06 MED ORDER — ALBUMIN HUMAN 25 % IV SOLN
25.0000 g | Freq: Four times a day (QID) | INTRAVENOUS | Status: DC
  Administered 2020-09-06: 25 g via INTRAVENOUS
  Filled 2020-09-06 (×2): qty 100

## 2020-09-06 MED ORDER — AMMONIA AROMATIC IN INHA
RESPIRATORY_TRACT | Status: AC
  Filled 2020-09-06: qty 10

## 2020-09-06 MED ORDER — ORAL CARE MOUTH RINSE: 15.0000 mL | OROMUCOSAL | Status: DC

## 2020-09-06 MED FILL — Medication: Qty: 1 | Status: AC

## 2020-09-07 LAB — CULTURE, BLOOD (ROUTINE X 2)
Culture: NO GROWTH
Special Requests: ADEQUATE

## 2020-09-09 LAB — CULTURE, BLOOD (ROUTINE X 2)
Culture: NO GROWTH
Culture: NO GROWTH

## 2020-09-12 SURGERY — ECHOCARDIOGRAM, TRANSESOPHAGEAL
Anesthesia: General

## 2020-09-13 NOTE — Consult Note (Signed)
NAME:  Whitney [name], MRN:  [id number], DOB:  28-Sep-1927, LOS: 4 ADMISSION DATE:  08/21/2020, CONSULTATION DATE:  10-01-2020 REFERRING MD:  Dr Sabino Gasser, CHIEF COMPLAINT:  acute respiratory failure   History of Present Illness:  Patient was admitted with fever, weakness Tested positive for COVID-started on IV fluids, antibiotics, remdesivir, steroids Noted to have MSSA bacteremia Ongoing history of diastolic heart failure, hypertension, diabetes, hyperlipidemia Admitted febrile, tachycardic  Increasing confusion noted 8/23 Decompensated this morning  Declined further with less responsiveness and about 1515 Suffered a PEA arrest Resuscitated for about 20 minutes prior to ROSC, heart pulseless for about 3 minutes, lost a pulses and had to be resuscitated for another 6 minutes. Achieved ROSC Transferred to the ICU on fluids and Levophed Progressively requiring increasing doses of Levophed, epinephrine being added Pertinent  Medical History   Past Medical History:  Diagnosis Date   Anemia    Atrial fibrillation ([place])    Breast cancer ([place]) 10/2013   right upper outer   Cancer ([place])    right breast   Complication of anesthesia    slow to wake up   Diabetes mellitus without complication ([place])    type 2   Dysrhythmia 10/15   AF   Former smoker    Full dentures    Hyperlipidemia    Hypertension    Multinodular goiter    Osteoporosis    Radiation    Right Breast   Thyroid disease    hypothyroidism   Wears glasses      Significant Hospital Events: Including procedures, antibiotic start and stop dates in addition to other pertinent events   Central line placement  Interim History / Subjective:  Decompensated today sustained PEA arrest Resuscitated  Objective   Blood pressure 92/63, pulse (!) 40, temperature 98 F (36.7 C), resp. rate (!) 26, height 5' 6"  (1.676 m), weight 71.5 kg, SpO2 99 %.    Vent Mode: PRVC FiO2 (%):  [100 %] 100 % Set Rate:  [26 bmp] 26 bmp Vt  Set:  [470 mL] 470 mL PEEP:  [5 cmH20] 5 cmH20 Plateau Pressure:  [21 cmH20] 21 cmH20   Intake/Output Summary (Last 24 hours) at October 01, 2020 1730 Last data filed at 2020-10-01 1200 Gross per 24 hour  Intake 2557.78 ml  Output 350 ml  Net 2207.78 ml   Filed Weights   09/02/20 0326 09/02/20 0400 09/04/20 0500  Weight: 69.1 kg 69.9 kg 71.5 kg    Examination: General: Elderly, chronically ill-appearing HENT: Dry oral mucosa Lungs: Decreased air movement bilateral Cardiovascular: S1-S2 appreciated Abdomen: Bowel sounds appreciated Extremities: No clubbing, no edema Neuro: Unresponsive GU:   Labs/imaging that I have personally reviewed  (right click and "Reselect all SmartList Selections" daily)  Chem-7 this morning significant for BUN of 59, creatinine of 2.80 WBC count of 7.2 with a hematocrit of 27 ABG postcode 7.0 1/62/120 Resolved Hospital Problem list     Assessment & Plan:  .  PEA arrest .  Severe sepsis .  MSSA bacteremia .  Right upper extremity cellulitis .  COVID-19 pneumonia .  Type 2 diabetes .  Essential hypertension .  Acute kidney injury on chronic kidney disease stage IIIb .  Chronic diastolic heart failure .  Paroxysmal atrial fibrillation .  Thrombocytopenia  Continue antibiotics for MSSA bacteremia and treatment for COVID On linagliptin for diabetes BP meds on hold Trend electrolytes  In the context of suffering PEA arrest and further discussions with the family, goal is to make  patient compassionate withdrawal of care once family members visited Whitney [name] at present we will continue pressors  Appreciate palliative care involvement  Discussed with Dr. Dwyane Dee  Anticipate patient's passing today September 13, 2020  Best Practice (right click and "Reselect all SmartList Selections" daily)   Diet/type: NPO Pain/Anxiety/Delirium protocol RASS goal: 0 VAP protocol (if indicated): Yes DVT prophylaxis: LMWH GI prophylaxis: N/A Glucose control:   SSI Central venous access:  Yes, and it is still needed Arterial line:  N/A Foley:  Yes, and it is still needed Mobility:  bed rest  PT consulted: N/A Studies pending: XRAY Culture data pending:none  Last reviewed culture data:today Antibiotics:ancef and antivirals  Antibiotic de-escalation: no,  continue current rx Stop date: to be determined  Code Status:  DNR Last date of multidisciplinary goals of care discussion [ongoing] ccm prognosis: Terminal Disposition: Continue current level of care    Labs   CBC: Recent Labs  Lab 09/05/2020 2314 09/02/20 0849 09/03/20 1242 09/04/20 0057 2020/09/13 0351  WBC 6.0 6.8 7.7 7.7 7.2  NEUTROABS  --  6.0 7.2 7.1 6.6  HGB 10.0* 10.0* 10.7* 10.8* 9.1*  HCT 30.7* 29.6* 33.5* 32.0* 27.8*  MCV 107.3* 105.3* 108.8* 103.9* 106.1*  PLT 144* 118* 166 128* 572    Basic Metabolic Panel: Recent Labs  Lab 09/02/20 1141 09/03/20 0443 09/04/20 0057 09/05/20 0847 2020-09-13 0351  NA 139 138 136 138 139  K 4.2 5.1 5.1 5.3* 4.6  CL 109 111 107 109 110  CO2 17* 20* 18* 16* 14*  GLUCOSE 162* 173* 181* 161* 132*  BUN 40* 43* 49* 54* 59*  CREATININE 2.01* 2.29* 2.44* 2.52* 2.80*  CALCIUM 8.0* 8.0* 7.7* 7.6* 7.6*  MG  --   --   --  2.2 2.0   GFR: Estimated Creatinine Clearance: 13.5 mL/min (A) (by C-G formula based on SCr of 2.8 mg/dL (H)). Recent Labs  Lab 08/24/2020 2245 09/12/2020 2314 09/02/20 0357 09/02/20 6203 09/02/20 0849 09/03/20 0443 09/03/20 1242 09/04/20 0057 13-Sep-2020 0351  PROCALCITON  --   --   --  0.25  --  0.36  --   --   --   WBC  --    < >  --   --  6.8  --  7.7 7.7 7.2  LATICACIDVEN 2.6*  --  2.9* 2.5*  --   --   --   --   --    < > = values in this interval not displayed.    Liver Function Tests: Recent Labs  Lab 09/02/20 1141 09/03/20 0443 09/04/20 0057 09/05/20 0847 13-Sep-2020 0351  AST 83* 117* 84* 40 37  ALT 72* 86* 78* 29 11  ALKPHOS 36* 39 40 34* 38  BILITOT 0.8 1.2 0.9 1.2 1.0  PROT 5.0* 5.1* 5.0* 5.5*  5.6*  ALBUMIN 2.4* 2.6* 2.4* 3.5 3.6   No results for input(s): LIPASE, AMYLASE in the last 168 hours. No results for input(s): AMMONIA in the last 168 hours.  ABG    Component Value Date/Time   PHART 7.012 (LL) 13-Sep-2020 1624   PCO2ART 61.5 (H) 09-13-20 1624   PO2ART 120 (H) Sep 13, 2020 1624   HCO3 14.8 (L) 09/13/20 1624   TCO2 28 10/23/2016 1338   ACIDBASEDEF 15.5 (H) 13-Sep-2020 1624   O2SAT 96.1 09-13-20 1624     Coagulation Profile: Recent Labs  Lab 08/19/2020 2245 09/02/20 0638  INR 1.3* 1.4*    Cardiac Enzymes: No results for input(s): CKTOTAL, CKMB, CKMBINDEX, TROPONINI in the last  168 hours.  HbA1C: Hemoglobin A1C  Date/Time Value Ref Range Status  01/19/2020 10:47 AM 5.8 (A) 4.0 - 5.6 % Final  07/19/2019 09:15 AM 6.1 (A) 4.0 - 5.6 % Final   Hgb A1c MFr Bld  Date/Time Value Ref Range Status  07/19/2020 10:52 AM 5.9 4.6 - 6.5 % Final    Comment:    Glycemic Control Guidelines for People with Diabetes:Non Diabetic:  <6%Goal of Therapy: <7%Additional Action Suggested:  >8%   04/18/2018 08:48 AM 6.4 (H) <5.7 % of total Hgb Final    Comment:    For someone without known diabetes, a hemoglobin  A1c value between 5.7% and 6.4% is consistent with prediabetes and should be confirmed with a  follow-up test. . For someone with known diabetes, a value <7% indicates that their diabetes is well controlled. A1c targets should be individualized based on duration of diabetes, age, comorbid conditions, and other considerations. . This assay result is consistent with an increased risk of diabetes. . Currently, no consensus exists regarding use of hemoglobin A1c for diagnosis of diabetes for children. .     CBG: Recent Labs  Lab 09/05/20 1112 09/05/20 1635 09/05/20 2041 Oct 02, 2020 0813 10/02/2020 1158  GLUCAP 244* 196* 189* 158* 162*    Review of Systems:   Unobtainable  Past Medical History:  She,  has a past medical history of Anemia, Atrial  fibrillation ([place]), Breast cancer ([place]) (10/2013), Cancer ([place]), Complication of anesthesia, Diabetes mellitus without complication ([place]), Dysrhythmia (10/15), Former smoker, Full dentures, Hyperlipidemia, Hypertension, Multinodular goiter, Osteoporosis, Radiation, Thyroid disease, and Wears glasses.   Surgical History:   Past Surgical History:  Procedure Laterality Date   ABDOMINAL HYSTERECTOMY     AXILLARY LYMPH NODE DISSECTION Right 01/09/2014   Procedure: RIGHT AXILLARY LYMPH NODE DISECTION;  Surgeon: [name], MD;  Location[place];  Service: General;  Laterality: Right;   BIOPSY  10/26/2019   Procedure: BIOPSY;  Surgeon: Irving Copas., MD;  Location: [facility];  Service: Gastroenterology;;   BIOPSY  03/11/2020   Procedure: BIOPSY;  Surgeon: Irving Copas., MD;  Location: [place];  Service: Gastroenterology;;   BREAST LUMPECTOMY WITH RADIOACTIVE SEED LOCALIZATION Right 01/09/2014   Procedure: RIGHT BREAST SEED LOCALIZED LUMPECTOMY ;  Surgeon: [name], MD;  Location: [place];  Service: General;  Laterality: Right;   COLONOSCOPY WITH PROPOFOL N/A 10/26/2019   Procedure: COLONOSCOPY WITH PROPOFOL;  Surgeon: Irving Copas., MD;  Location: [facility];  Service: Gastroenterology;  Laterality: N/A;   COLONOSCOPY WITH PROPOFOL N/A 03/11/2020   Procedure: COLONOSCOPY WITH PROPOFOL- Ultraslim scope;  Surgeon: Irving Copas., MD;  Location: [place];  Service: Gastroenterology;  Laterality: N/A;   ENDOSCOPIC MUCOSAL RESECTION N/A 03/11/2020   Procedure: ENDOSCOPIC MUCOSAL RESECTION;  Surgeon: Rush Landmark Telford Nab., MD;  Location: [place];  Service: Gastroenterology;  Laterality: N/A;   ESOPHAGOGASTRODUODENOSCOPY (EGD) WITH PROPOFOL N/A 10/26/2019   Procedure: ESOPHAGOGASTRODUODENOSCOPY (EGD) WITH PROPOFOL;  Surgeon: Rush Landmark Telford Nab., MD;  Location: [facility];  Service: Gastroenterology;   Laterality: N/A;   EYE SURGERY  12/22/2009   cataracts   HEMOSTASIS CLIP PLACEMENT  10/26/2019   Procedure: HEMOSTASIS CLIP PLACEMENT;  Surgeon: Irving Copas., MD;  Location: [facility];  Service: Gastroenterology;;  marking for transverse lesion   INTRAMEDULLARY (IM) NAIL INTERTROCHANTERIC Left 02/24/2014   Procedure: IM ROD LEFT HIP FX;  Surgeon: Alta Corning, MD;  Location: [place];  Service: Orthopedics;  Laterality: Left;   left  distal radius fracture     2020   POLYPECTOMY  10/26/2019   Procedure: POLYPECTOMY;  Surgeon: Mansouraty, Telford Nab., MD;  Location: [facility];  Service: Gastroenterology;;   POLYPECTOMY  03/11/2020   Procedure: POLYPECTOMY;  Surgeon: Irving Copas., MD;  Location: [place];  Service: Gastroenterology;;   [facility] REMOVAL Right 01/09/2014   Procedure: REMOVAL PORT-A-CATH;  Surgeon: [name], MD;  Location: [place];  Service: General;  Laterality: Right;   PORTACATH PLACEMENT N/A 11/15/2013   Procedure: INSERTION PORT-A-CATH WITH ULTRA SOUND AND FLOROSCOPY;  Surgeon: [name], MD;  Location: [place];  Service: General;  Laterality: N/A;   SUBMUCOSAL TATTOO INJECTION  10/26/2019   Procedure: SUBMUCOSAL TATTOO INJECTION;  Surgeon: Irving Copas., MD;  Location: [facility];  Service: Gastroenterology;;   TOTAL KNEE ARTHROPLASTY  2002   left     Social History:   reports that she quit smoking about 61 years ago. She has a 5.00 pack-year smoking history. She has never used smokeless tobacco. She reports that she does not drink alcohol and does not use drugs.   Family History:  Her family history is negative for Colon cancer, Esophageal cancer, Inflammatory bowel disease, Liver disease, Pancreatic cancer, Rectal cancer, and Stomach cancer.   Allergies Allergies  Allergen Reactions   Tape Other (See Comments)    Skin is somewhat sensitive     Home Medications  Prior to Admission medications    Medication Sig Start Date End Date Taking? Authorizing Provider  amiodarone (PACERONE) 200 MG tablet Take 1 tablet (200 mg total) by mouth daily. 02/05/20  Yes [name], MD  atorvastatin (LIPITOR) 10 MG tablet Take 1 tablet (10 mg total) by mouth daily. 05/03/20  Yes [name], MD  cholecalciferol (VITAMIN D3) 25 MCG (1000 UNIT) tablet TAKE 1 TAB BY MOUTH EVERY DAY Patient taking differently: Take 1,000 Units by mouth daily. 06/03/20  Yes [name], MD  docusate sodium (COLACE) 100 MG capsule Take 100 mg by mouth daily.   Yes [provider]  doxazosin (CARDURA) 4 MG tablet Take 0.5 tablets (2 mg total) by mouth daily. 10/30/19  Yes Rai, Ripudeep K, MD  ferrous sulfate 324 (65 Fe) MG TBEC TAKE 1 TABLET BY MOUTH IN THE MORNING AND 1 AT BEDTIME Patient taking differently: Take 324 mg by mouth 2 (two) times daily. 05/07/20  Yes [name], MD  furosemide (LASIX) 40 MG tablet TAKE 2 TABLETS BY MOUTH EVERY DAY Patient taking differently: Take 80 mg by mouth daily. 07/24/20  Yes Pickard, Cammie Mcgee, MD  JANUVIA 100 MG tablet TAKE 0.5 TAB BY MOUTH DAILY Patient taking differently: Take 50 mg by mouth daily. 06/03/20  Yes [name], MD  meclizine (ANTIVERT) 12.5 MG tablet TAKE 1 TABLET BY MOUTH DAILY AS NEEDED FOR DIZZINESS Patient taking differently: Take 12.5 mg by mouth as needed for dizziness. 06/03/20  Yes [name], MD  metoprolol succinate (TOPROL-XL) 100 MG 24 hr tablet TAKE 1 TABLET BY MOUTH TWICE A DAY WITH OR IMMEDIATELY FOLLOWING A MEAL Patient taking differently: Take 100 mg by mouth 2 (two) times daily. WITH OR IMMEDIATELY FOLLOWING A MEAL 02/05/20  Yes Elayne Snare, MD  polyethylene glycol (MIRALAX / GLYCOLAX) 17 g packet Take 17 g by mouth daily. Also available OTC Patient taking differently: Take 17 g by mouth as needed for mild constipation. 10/31/19  Yes Rai, Ripudeep K, MD  potassium chloride SA (KLOR-CON M20) 20 MEQ  tablet Take 2 tablets  (40 mEq total) by mouth daily. 07/24/20  Yes [name], MD  tamoxifen (NOLVADEX) 20 MG tablet TAKE 1 TABLET BY MOUTH EVERY DAY Patient taking differently: Take 20 mg by mouth daily. 03/25/20  Yes Nicholas Lose, MD  Blood Glucose Monitoring Suppl (ONETOUCH VERIO) w/Device KIT Use to test blood sugars daily. 11/23/17   Elayne Snare, MD  metolazone (ZAROXOLYN) 5 MG tablet Take 1 tablet (5 mg total) by mouth once for 1 dose. Take 30 minutes before her morning furosemide (one time) Patient not taking: Reported on 09/02/2020 08/06/20 08/06/20  [name], MD  [facility] VERIO test strip CHECK BLOOD SUGAR EVERY DAY AS DIRECTED 02/05/20   Elayne Snare, MD    The patient is critically ill with multiple organ systems failure and requires high complexity decision making for assessment and support, frequent evaluation and titration of therapies, application of advanced monitoring technologies and extensive interpretation of multiple databases. Critical Care Time devoted to patient care services described in this note independent of APP/resident time (if applicable)  is 30 minutes.   Sherrilyn Rist MD [place] [facility] Personal pager: [phone] If unanswered, please page CCM On-call: [phone]

## 2020-09-13 NOTE — Progress Notes (Addendum)
Updated by MD on current plan of care. Patient's family members also updated by MD. Will start patient on morphine drip and carry out additional interventions ordered.

## 2020-09-13 NOTE — Progress Notes (Signed)
Daily Progress Note   Patient Name: Whitney [name]       Date: 09/11/20 DOB: 12-Jul-1927  Age: 87 y.o. MRN#: [id number] Attending Physician: Dwyane Dee, MD Primary Care Physician: [name], MD Admit Date: 08/18/2020  Reason for Consultation/Follow-up: Establishing goals of care  Subjective: Chart Reviewed. Updates Received. Patient Assessed.   Patient with some restlessness overnight with delirium.   Unfortunately this afternoon patient suffered a PEA arrest and underwent CPR initially for 20 minutes before losing pulse again for approximately 5 min before ROSC achieved. Dr. Rowe Pavy initially called family and provided updates during the Code.   I was able to speak with daughter, Clyda Greener via phone and provided further updates including the need for further decision making given length of CPR and minimal chances of survival. Family arrived including Jolette (daughter), [place] and another granddaughter. [place] fainted at bedside upon arriving to the room. Awaken an assisted in wheelchair. Family taken to family room for further discussions and patient transferred to ICU.   Additional family including patient's 3 other children their spouses, and Fritz Pickerel patient's son Chauncey Reading). Dr. Sabino Gasser and I provided extensive updates to family discussing poor prognosis. Recommendations to allow patient to be comfortable and a natural death without further intervention.  Family all mutually agreed to DNR expressing their awareness of their mother's decreased quality of life and that she would not want to live in current state of health.   Family is requesting to visit with patient and say their goodbyes. They do not wish for her to suffer or remain on life-sustaining machines. They have expressed wishes to withdraw care and for compassionate extubation once ready and allow patient a natural death.   Emotional support provided. Curt Bears Data processing manager) also present providing support to family during code  and discussions. Appreciative of her assistance and support in addition to Dr. Rowe Pavy and Dr. Sabino Gasser.   All questions answered and support provided. Family tearful as expected with a variety of emotions.   Length of Stay: 4 days  Vital Signs: BP (!) 81/52   Pulse (!) 112   Temp 98 F (36.7 C)   Resp (!) 23   Ht 5\' 6"  (1.676 m)   Wt 71.5 kg   LMP  (LMP Unknown)   SpO2 100%   BMI 25.44 kg/m  SpO2: SpO2: 100 % O2 Device: O2 Device: Ventilator O2 Flow Rate:     Palliative Care Assessment & Plan  HPI: Palliative Care consult requested for goals of care discussion in this 87 y.o. female with a medical history significant for diabetes, hypertension, hyperlipidemia, atrial fibrillation, diastolic CHF, and anemia. She presented to the ED from home with complaints of generalized weakness and fever. Chest x-ray showed small right pleural effusion. Subsequently was found to be COVID-19 positive. Receiving Remdesivir. Also noted to have right upper extremity cellulitis.  Patient being treated for sepsis.  Code Status: Full code  Goals of Care/Recommendations: Family confirmed wishes for DNR.  They would like to have some time at the bedside with patient and expressed their good-byes with plans to compassionately extubate allowing for a natural death once all family has visited. Fritz Pickerel (son/HCPOA) at the bedside confirming wishes with support from his siblings.   Prognosis: POOR  Discharge Planning: Anticipated Hospital Death  Thank you for allowing the Palliative Medicine Team to assist in the care of this patient.  Time Total: 90 min.   Visit consisted of counseling and education dealing with the complex and emotionally intense issues  of symptom management and palliative care in the setting of serious and potentially life-threatening illness.Greater than 50%  of this time was spent counseling and coordinating care related to the above assessment and plan.  Alda Lea,  AGPCNP-BC  Palliative Medicine Team [phone]

## 2020-09-13 NOTE — Plan of Care (Signed)
  Problem: Education: Goal: Knowledge of risk factors and measures for prevention of condition will improve Outcome: Progressing   Problem: Coping: Goal: Psychosocial and spiritual needs will be supported Outcome: Progressing   Problem: Respiratory: Goal: Will maintain a patent airway Outcome: Progressing Goal: Complications related to the disease process, condition or treatment will be avoided or minimized Outcome: Progressing   

## 2020-09-13 NOTE — Procedures (Signed)
Extubation Procedure Note  Patient Details:   Name: Whitney [name] DOB: 1927/10/30 MRN: [id number]   Airway Documentation:    Vent end date: September 10, 2020 Vent end time: 1845   Evaluation  O2 sats: currently acceptable Complications: No apparent complications Patient did tolerate procedure well. Bilateral Breath Sounds: Rhonchi   No  Patient terminally extubated for comfort care at this time. Patient resting comfortably. RN at bedside.   Myrtie Neither 09-10-2020, 7:06 PM

## 2020-09-13 NOTE — Progress Notes (Signed)
This RN witnessed 60ml of Morphine wasted with Donnamarie Rossetti RN in stericycle in medication room

## 2020-09-13 NOTE — Progress Notes (Signed)
Around 1515 Came to assess my patient, which on monitor was showing Asystole. On the answer "are you ok ', pt responded yes. I said: "I am going to make you a bath", on which she responded ok. I noted she was very drowsy. (Per prior nurse pt did not have sleep for 2 days). After bath was done, I asked how was it. She did not responded. Sternal rub was performed without response. Code blue was pushed at 1538. MD and stuff at the bedside immediately. Code continued over an hour, Multiple Epis, Bicarbs were given. ROSC achieved. Pt was stuck by IV team several times, with no success. IO was placed. 1L bolus was given. Family was called and came. While MD was explaining what happened, family member passed out. Wheelchair was brought and transferred family member out of the room. Family are not accepting the situation and consistent on a full code. Pt was transferred to ICU 27, report was given at the bedside.Pt belongings at the bedside

## 2020-09-13 NOTE — ED Provider Notes (Signed)
[facility]  Department of Emergency Medicine   Code Blue CONSULT NOTE  Chief Complaint: Cardiac arrest/unresponsive   Level V Caveat: Unresponsive  History of present illness: I was contacted by the hospital for a CODE BLUE cardiac arrest upstairs and presented to the patient's bedside.  PT reportedly admitted for COVID-19. Was eating lunch while nurse at bedside and nurse states she became unresponsive. CPR initiated prior to my arrival; epi x 2 and bicarb x 1 given.  ROS: Unable to obtain, Level V caveat  Scheduled Meds:  amiodarone  200 mg Oral Daily   vitamin C  500 mg Oral Daily   atorvastatin  10 mg Oral Daily   dexamethasone (DECADRON) injection  6 mg Intravenous Daily   enoxaparin (LOVENOX) injection  30 mg Subcutaneous Q24H   insulin aspart  0-15 Units Subcutaneous TID WC   insulin aspart  0-5 Units Subcutaneous QHS   linagliptin  5 mg Oral Daily   LORazepam       LORazepam       metoprolol succinate  100 mg Oral BID   zinc sulfate  220 mg Oral Daily   Continuous Infusions:   ceFAZolin (ANCEF) IV 2 g (10/06/20 0813)   PRN Meds:.acetaminophen, chlorpheniramine-HYDROcodone, guaiFENesin-dextromethorphan Past Medical History:  Diagnosis Date   Anemia    Atrial fibrillation ([place])    Breast cancer ([place]) 10/2013   right upper outer   Cancer ([place])    right breast   Complication of anesthesia    slow to wake up   Diabetes mellitus without complication ([place])    type 2   Dysrhythmia 10/15   AF   Former smoker    Full dentures    Hyperlipidemia    Hypertension    Multinodular goiter    Osteoporosis    Radiation    Right Breast   Thyroid disease    hypothyroidism   Wears glasses    Past Surgical History:  Procedure Laterality Date   ABDOMINAL HYSTERECTOMY     AXILLARY LYMPH NODE DISSECTION Right 01/09/2014   Procedure: RIGHT AXILLARY LYMPH NODE DISECTION;  Surgeon: [name], MD;  Location: [facility];  Service: General;   Laterality: Right;   BIOPSY  10/26/2019   Procedure: BIOPSY;  Surgeon: Irving Copas., MD;  Location: [facility];  Service: Gastroenterology;;   BIOPSY  03/11/2020   Procedure: BIOPSY;  Surgeon: Irving Copas., MD;  Location: [facility];  Service: Gastroenterology;;   BREAST LUMPECTOMY WITH RADIOACTIVE SEED LOCALIZATION Right 01/09/2014   Procedure: RIGHT BREAST SEED LOCALIZED LUMPECTOMY ;  Surgeon: [name], MD;  Location: [facility];  Service: General;  Laterality: Right;   COLONOSCOPY WITH PROPOFOL N/A 10/26/2019   Procedure: COLONOSCOPY WITH PROPOFOL;  Surgeon: Irving Copas., MD;  Location: [facility];  Service: Gastroenterology;  Laterality: N/A;   COLONOSCOPY WITH PROPOFOL N/A 03/11/2020   Procedure: COLONOSCOPY WITH PROPOFOL- Ultraslim scope;  Surgeon: Irving Copas., MD;  Location: [facility];  Service: Gastroenterology;  Laterality: N/A;   ENDOSCOPIC MUCOSAL RESECTION N/A 03/11/2020   Procedure: ENDOSCOPIC MUCOSAL RESECTION;  Surgeon: Rush Landmark Telford Nab., MD;  Location: [facility];  Service: Gastroenterology;  Laterality: N/A;   ESOPHAGOGASTRODUODENOSCOPY (EGD) WITH PROPOFOL N/A 10/26/2019   Procedure: ESOPHAGOGASTRODUODENOSCOPY (EGD) WITH PROPOFOL;  Surgeon: Rush Landmark Telford Nab., MD;  Location: [facility];  Service: Gastroenterology;  Laterality: N/A;   EYE SURGERY  12/22/2009   cataracts   HEMOSTASIS CLIP PLACEMENT  10/26/2019   Procedure: HEMOSTASIS  CLIP PLACEMENT;  Surgeon: Mansouraty, Telford Nab., MD;  Location: [facility];  Service: Gastroenterology;;  marking for transverse lesion   INTRAMEDULLARY (IM) NAIL INTERTROCHANTERIC Left 02/24/2014   Procedure: IM ROD LEFT HIP FX;  Surgeon: Alta Corning, MD;  Location: [facility];  Service: Orthopedics;  Laterality: Left;   left distal radius fracture     2020   POLYPECTOMY  10/26/2019   Procedure: POLYPECTOMY;  Surgeon: Mansouraty, Telford Nab., MD;  Location: [facility];  Service: Gastroenterology;;   POLYPECTOMY  03/11/2020   Procedure: POLYPECTOMY;  Surgeon: Irving Copas., MD;  Location: [facility];  Service: Gastroenterology;;   [facility] REMOVAL Right 01/09/2014   Procedure: REMOVAL PORT-A-CATH;  Surgeon: [name], MD;  Location: [facility];  Service: General;  Laterality: Right;   PORTACATH PLACEMENT N/A 11/15/2013   Procedure: INSERTION PORT-A-CATH WITH ULTRA SOUND AND FLOROSCOPY;  Surgeon: [name], MD;  Location: [facility];  Service: General;  Laterality: N/A;   SUBMUCOSAL TATTOO INJECTION  10/26/2019   Procedure: SUBMUCOSAL TATTOO INJECTION;  Surgeon: Irving Copas., MD;  Location: [facility];  Service: Gastroenterology;;   TOTAL KNEE ARTHROPLASTY  2002   left   Social History   Socioeconomic History   Marital status: Widowed    Spouse name: Not on file   Number of children: 4   Years of education: Not on file   Highest education level: Not on file  Occupational History   Not on file  Tobacco Use   Smoking status: Former    Packs/day: 1.00    Years: 5.00    Pack years: 5.00    Types: Cigarettes    Quit date: 11/14/1958    Years since quitting: 61.8   Smokeless tobacco: Never  Vaping Use   Vaping Use: Never used  Substance and Sexual Activity   Alcohol use: No    Alcohol/week: 0.0 standard drinks   Drug use: No   Sexual activity: Not Currently    Birth control/protection: Post-menopausal    Comment: menarche age 57, P83, first birth age 55, no HRT, menopause age 15  Other Topics Concern   Not on file  Social History Narrative   Not on file   Social Determinants of Health   Financial Resource Strain: Low Risk    Difficulty of Paying Living Expenses: Not very hard  Food Insecurity: Not on file  Transportation Needs: Not on file  Physical Activity: Not on file  Stress: Not on file  Social Connections: Not on file  Intimate Partner Violence: Not on file   Allergies   Allergen Reactions   Tape Other (See Comments)    Skin is somewhat sensitive    Last set of Vital Signs (not current) Vitals:   24-Sep-2020 0507 2020-09-24 1354  BP: 112/89 92/63  Pulse: 88 65  Resp: 20   Temp: 98 F (36.7 C)   SpO2: 98% 99%      Physical Exam Gen: unresponsive Cardiovascular: pulseless  Resp: occasional agonal respirations, rhonchi and crackles b/l with bagging Abd: nondistended  Neuro: GCS 3, unresponsive to pain  HEENT: Food debris in posterior oropharynx, gag reflex absent  Neck: No crepitus  Musculoskeletal: No deformity  Skin: cool  Procedures  INTUBATION Performed by: Wenda Overland Jaxson Keener Required items: required blood products, implants, devices, and special equipment available Patient identity confirmed: provided demographic data and hospital-assigned identification number Time out: Immediately prior to procedure a "time out" was called to verify the correct patient, procedure, equipment, support  staff and site/side marked as required. Indications: Respiratory failure Intubation method: Direct laryngoscopy Preoxygenation: BVM with 100% oxygen Sedatives: none Paralytic: none Tube Size: 7.0 cuffed Post-procedure assessment: chest rise and ETCO2 monitor Breath sounds: equal and absent over the epigastrium Tube secured by Respiratory Therapy Patient tolerated the procedure well with no immediate complications.  CRITICAL CARE Performed by: Wenda Overland Revan Gendron Total critical care time: 30 Critical care time was exclusive of separately billable procedures and treating other patients. Critical care was necessary to treat or prevent imminent or life-threatening deterioration. Critical care was time spent personally by me on the following activities: development of treatment plan with patient and/or surrogate as well as nursing, discussions with consultants, evaluation of patient's response to treatment, examination of patient, obtaining history from  patient or surrogate, ordering and performing treatments and interventions, ordering and review of laboratory studies, ordering and review of radiographic studies, pulse oximetry and re-evaluation of patient's condition.  Cardiopulmonary Resuscitation (CPR) Procedure Note Directed/Performed by: Wenda Overland Cyndia Degraff I personally directed ancillary staff and/or performed CPR in an effort to regain return of spontaneous circulation and to maintain cardiac, neuro and systemic perfusion.    Medical Decision making  Patient had aspirated a large amount of her lunch on visual exam during intubation. ETT placed on first attempt and placement confirmed by + color change on ETCO2 and auscultation. Primary attending on scene continued CPR efforts, gave 3 more rounds of epi. Palliative care attending contacted family, who requested continuing efforts until daughter arrived. Care passed off to primary attending and palliative care attending on scene, who will continue CPR until daughter arrives.  Assessment and Plan  Cardiopulmonary arrest COVID-19 infection    Raksha Wolfgang, Wenda Overland, MD 09-27-20 [phone]

## 2020-09-13 NOTE — Progress Notes (Signed)
ID brief note  Patient not seen, chart reviewed  Afebrile, no leukocytosis, blood cultures remain no growth to date Left upper arm IV infiltration TEE to be planned after 10 days of COVID test  Recommendation Continue cefazolin, dosing per pharmacy Follow-up repeat blood cultures, remains no growth so far Okay to place a central line for IV needs if blood cultures are negative in 48 hours Will need at least 2 weeks of antibiotics or even longer depending upon TEE findings Monitor for metastatic signs of infection Please call us back when TEE is done to determine duration of treatment or but blood cultures grow MSSA I will sign off for now  Rosiland Oz, MD Infectious Disease Physician [facility] [address] [place], [place] [address] Phone: [phone]  Fax: [phone]

## 2020-09-13 NOTE — Progress Notes (Signed)
During code seizure activity noted. Per MD give 2 mg of Ativan x2.

## 2020-09-13 NOTE — Progress Notes (Addendum)
PIV consult: L upper arm with apparent large volume infiltration. Unable to place PIV in forearm due to this. Pt has several bruises to forearm and both hands. R forearm to elbow red and edematous. PIV placed in R upper arm for medications. Providers, please consider central line for venous access; due to condition of arms, PICC is not recommended.

## 2020-09-13 NOTE — Significant Event (Signed)
US guided IV placed 6/22 infiltrated this morning.  Removed, IV site (left upper arm) +3 edema, red and cool to touch.   Left AC IV site flushed, but leaking, removed

## 2020-09-13 NOTE — Progress Notes (Signed)
Progress Note    Whitney [name]   MRN:[id number]  DOB: 08/02/1927  DOA: 08/21/2020     4  PCP: Pickard, [name], MD  CC: fever, weakness  Hospital Course: 87-year-old female with history of paroxysmal A. fib, diabetes mellitus type 2, hypertension, hyperlipidemia, chronic diastolic CHF presented with fever and weakness.  On presentation, she was found to be febrile, tachycardic with lactic acid of 2.6, creatinine of 2.26.  Chest x-ray showed bibasilar opacities with small right pleural effusion.  She tested positive for COVID.  She was found to have right upper extremity cellulitis.  She was started on IV fluids, antibiotics, remdesivir and steroids.  She was subsequently found to have MSSA bacteremia  Interval History:  Overnight patient became delirious and disoriented.  She was witnessed to be trying to get out of bed and was difficult to redirect.  This morning she was lethargic and overly tired from being up all night. She was mumbling but following my commands and there were no focal deficits on exam.  Plan has been to await 10 days post covid test until TEE to be performed.   This afternoon the patient continued to decline and a CODE BLUE was called at 1515. I was immediately present and ran the code to entirety.  We did 1 long round of CPR for approximately 20 minutes and got ROSC for a few minutes before losing pulse again (PEA both arrests). She was coded again for another 5-6 minutes and ROSC again achieved. Multiple family members then were present including patient's granddaughter who fainted in the patient's room upon seeing her grandmother. She awoke after an ammonia inhalent and being placed in a chair in the hallway.  No formal decision for code status could be elicited from family despite multiple discussions from myself and palliative care.  We then transferred patient to ICU 27 and connected her to the vent and continued a Levophed drip while further discussions  continued.   The patient's son, Larry who is POA then arrived. We all convened in the ICU waiting room and myself and palliative care discussed her case, prognosis, events thus far and plans next. Cumulatively the majority of family including Larry agreed upon a DNR given her poor prognosis and small chance of any meaningful recovery.   As I write this note the tentative plan is to then allow family to visit bedside and if Larry and majority family are in agreement, we will then pursue withdrawing care and performing a compassionate extubation.    ROS: Review of systems not obtained due to patient factors. Unable to interact this am, poor cognitive funciton  Assessment & Plan:  PEA cardiac arrest - patient declined today into PEA arrest and CPR commenced at 1538. See code sheet for formal documentation. Long code was performed and at this time her neurologic function is poor and she has very minimal chance of any meaningful recovery. She remains unstable and family has consented to a DNR at this time. After they visit bedside tentative plan will be to perform a compassionate extubation - I have made her DNR officially - await family to visit patient, if in agreement, we will pursue compassionate extubation - until then, continue vent and vasopressor support escalating as needed  Severe Sepsis MSSA bacteremia Right upper extremity cellulitis Presented with fever, tachycardia, lactic acidosis, acute kidney injury with right upper extremity cellulitis and COVID-pneumonia. -now has MSSA bacteremia.  ID has been also consulted.  Antibiotics has been switched to   IV Ancef. -TTE negative; TEE recommended per ID; cardiology consulted.  TEE was planned to not be performed until patient 10 days out of COVID test per cardiology; see above events now   COVID-19 pneumonia -Respiratory status had been stable but on 6/24 she started to acutely decline into PEA arrest (see above)  Diabetes mellitus type  2 with hyperglycemia -A1c 5.9 on 07/19/2020.  Continue linagliptin.  CBGs with SSI   Essential hypertension -holding BP meds in setting of PEA arrest and now on vasopressor support  Acute kidney injury on CKD stage IIIb -Presented with creatinine of 2.31.  Baseline creatinine around 1.6-1.8.  - see above; poor prognosis and fulminant renal failure expected after her code blue   Chronic diastolic CHF - decompensation encountered on 6/24, PEA arrest - see events above    Paroxysmal A. Fib -Not on Eliquis anymore because of rectal bleeding in the past  Prolonged QT interval -Avoid QT intervals prolonging medications.  Monitor electrolytes   Generalized deconditioning -PT eval -Palliative care consulted and followed for goals of care discussion   Chronic macrocytic anemia - likely mixed 2/2 ACD -No signs of bleeding.  Monitor   Thrombocytopenia -No signs of bleeding.  Monitor  Old records reviewed in assessment of this patient  Antimicrobials: Cefepime 6/19 >> 6/20 Ancef 6/20 >> current Remdesivir 6/20 >> current   DVT prophylaxis: enoxaparin (LOVENOX) injection 30 mg Start: 09/02/20 1000   Code Status:   Code Status: DNR Family Communication: daughter  Disposition Plan: Status is: Inpatient  Remains inpatient appropriate because:Ongoing diagnostic testing needed not appropriate for outpatient work up, IV treatments appropriate due to intensity of illness or inability to take PO, and Inpatient level of care appropriate due to severity of illness  Dispo: The patient is from: SNF              Anticipated d/c is to: in hospital death expected              Patient currently is not medically stable to d/c.   Difficult to place patient No  Risk of unplanned readmission score: Unplanned Admission- Pilot do not use: 33.62   Objective: Blood pressure 92/63, pulse (!) 40, temperature 98 F (36.7 C), resp. rate (!) 26, height 5' 6" (1.676 m), weight 71.5 kg, SpO2 99 %.   Examination: Exam is from original exam this morning when patient seen: General appearance:  slowed mentation, very lethargic. Followed commands with squeezing my fingers and "thumbs up" this morning. Head: Normocephalic, without obvious abnormality, atraumatic Eyes:  EOMI Lungs:  Bibasilar crackles, no wheezing Heart: regular rate and rhythm and S1, S2 normal Abdomen: normal findings: bowel sounds normal and soft, non-tender Extremities:  2-3+ pitting edema in right upper extremity with erythema appreciated, no significant tenderness.  3+ bilateral lower extremity pitting edema also appreciated Skin: mobility and turgor normal Neurologic: moves UEs to commands, too lethargic to move LE much but attempts  Consultants:  ID Cardiology Palliative care  Procedures:    Data Reviewed: I have personally reviewed following labs and imaging studies Results for orders placed or performed during the hospital encounter of 09/03/2020 (from the past 24 hour(s))  Glucose, capillary     Status: Abnormal   Collection Time: 09/05/20  8:41 PM  Result Value Ref Range   Glucose-Capillary 189 (H) 70 - 99 mg/dL  CBC with Differential/Platelet     Status: Abnormal   Collection Time: 2020/09/18  3:51 AM  Result Value Ref Range  WBC 7.2 4.0 - 10.5 K/uL   RBC 2.62 (L) 3.87 - 5.11 MIL/uL   Hemoglobin 9.1 (L) 12.0 - 15.0 g/dL   HCT 27.8 (L) 36.0 - 46.0 %   MCV 106.1 (H) 80.0 - 100.0 fL   MCH 34.7 (H) 26.0 - 34.0 pg   MCHC 32.7 30.0 - 36.0 g/dL   RDW 16.8 (H) 11.5 - 15.5 %   Platelets 173 150 - 400 K/uL   nRBC 0.3 (H) 0.0 - 0.2 %   Neutrophils Relative % 91 %   Neutro Abs 6.6 1.7 - 7.7 K/uL   Lymphocytes Relative 3 %   Lymphs Abs 0.2 (L) 0.7 - 4.0 K/uL   Monocytes Relative 5 %   Monocytes Absolute 0.4 0.1 - 1.0 K/uL   Eosinophils Relative 0 %   Eosinophils Absolute 0.0 0.0 - 0.5 K/uL   Basophils Relative 0 %   Basophils Absolute 0.0 0.0 - 0.1 K/uL   RBC Morphology OVALOCYTES    Immature  Granulocytes 1 %   Abs Immature Granulocytes 0.04 0.00 - 0.07 K/uL  C-reactive protein     Status: Abnormal   Collection Time: 2020/09/26  3:51 AM  Result Value Ref Range   CRP 1.1 (H) <1.0 mg/dL  Comprehensive metabolic panel     Status: Abnormal   Collection Time: 26-Sep-2020  3:51 AM  Result Value Ref Range   Sodium 139 135 - 145 mmol/L   Potassium 4.6 3.5 - 5.1 mmol/L   Chloride 110 98 - 111 mmol/L   CO2 14 (L) 22 - 32 mmol/L   Glucose, Bld 132 (H) 70 - 99 mg/dL   BUN 59 (H) 8 - 23 mg/dL   Creatinine, Ser 2.80 (H) 0.44 - 1.00 mg/dL   Calcium 7.6 (L) 8.9 - 10.3 mg/dL   Total Protein 5.6 (L) 6.5 - 8.1 g/dL   Albumin 3.6 3.5 - 5.0 g/dL   AST 37 15 - 41 U/L   ALT 11 0 - 44 U/L   Alkaline Phosphatase 38 38 - 126 U/L   Total Bilirubin 1.0 0.3 - 1.2 mg/dL   GFR, Estimated 16 (L) >60 mL/min   Anion gap 15 5 - 15  Ferritin     Status: None   Collection Time: 09/26/20  3:51 AM  Result Value Ref Range   Ferritin 267 11 - 307 ng/mL  Magnesium     Status: None   Collection Time: 09/26/20  3:51 AM  Result Value Ref Range   Magnesium 2.0 1.7 - 2.4 mg/dL  Lactate dehydrogenase     Status: None   Collection Time: September 26, 2020  3:51 AM  Result Value Ref Range   LDH 158 98 - 192 U/L  D-dimer, quantitative     Status: Abnormal   Collection Time: 09/26/20  3:51 AM  Result Value Ref Range   D-Dimer, Quant 3.02 (H) 0.00 - 0.50 ug/mL-FEU  Glucose, capillary     Status: Abnormal   Collection Time: September 26, 2020  8:13 AM  Result Value Ref Range   Glucose-Capillary 158 (H) 70 - 99 mg/dL  Glucose, capillary     Status: Abnormal   Collection Time: 2020-09-26 11:58 AM  Result Value Ref Range   Glucose-Capillary 162 (H) 70 - 99 mg/dL  Blood gas, arterial     Status: Abnormal   Collection Time: 2020-09-26  4:24 PM  Result Value Ref Range   pH, Arterial 7.012 (LL) 7.350 - 7.450   pCO2 arterial 61.5 (H) 32.0 - 48.0 mmHg  pO2, Arterial 120 (H) 83.0 - 108.0 mmHg   Bicarbonate 14.8 (L) 20.0 - 28.0 mmol/L    Acid-base deficit 15.5 (H) 0.0 - 2.0 mmol/L   O2 Saturation 96.1 %   Patient temperature 98.6     Recent Results (from the past 240 hour(s))  Resp Panel by RT-PCR (Flu A&B, Covid) Nasopharyngeal Swab     Status: Abnormal   Collection Time: 08/14/2020 10:45 PM   Specimen: Nasopharyngeal Swab; Nasopharyngeal(NP) swabs in vial transport medium  Result Value Ref Range Status   SARS Coronavirus 2 by RT PCR POSITIVE (A) NEGATIVE Final    Comment: RESULT CALLED TO, READ BACK BY AND VERIFIED WITH: HALEY SHEPHARD AT 0016 ON 09/02/20 BY MAJ (NOTE) SARS-CoV-2 target nucleic acids are DETECTED.  The SARS-CoV-2 RNA is generally detectable in upper respiratory specimens during the acute phase of infection. Positive results are indicative of the presence of the identified virus, but do not rule out bacterial infection or co-infection with other pathogens not detected by the test. Clinical correlation with patient history and other diagnostic information is necessary to determine patient infection status. The expected result is Negative.  Fact Sheet for Patients: https://www.fda.gov/media/152166/download  Fact Sheet for Healthcare Providers: https://www.fda.gov/media/152162/download  This test is not yet approved or cleared by the [place] FDA and  has been authorized for detection and/or diagnosis of SARS-CoV-2 by FDA under an Emergency Use Authorization (EUA).  This EUA will remain in effect (meaning this tes t can be used) for the duration of  the COVID-19 declaration under Section 564(b)(1) of the Act, 21 U.S.C. section 360bbb-3(b)(1), unless the authorization is terminated or revoked sooner.     Influenza A by PCR NEGATIVE NEGATIVE Final   Influenza B by PCR NEGATIVE NEGATIVE Final    Comment: (NOTE) The Xpert Xpress SARS-CoV-2/FLU/RSV plus assay is intended as an aid in the diagnosis of influenza from Nasopharyngeal swab specimens and should not be used as a sole basis for  treatment. Nasal washings and aspirates are unacceptable for Xpert Xpress SARS-CoV-2/FLU/RSV testing.  Fact Sheet for Patients: https://www.fda.gov/media/152166/download  Fact Sheet for Healthcare Providers: https://www.fda.gov/media/152162/download  This test is not yet approved or cleared by the [place] FDA and has been authorized for detection and/or diagnosis of SARS-CoV-2 by FDA under an Emergency Use Authorization (EUA). This EUA will remain in effect (meaning this test can be used) for the duration of the COVID-19 declaration under Section 564(b)(1) of the Act, 21 U.S.C. section 360bbb-3(b)(1), unless the authorization is terminated or revoked.  Performed at [facility], [address] [place], [place] [address]   Blood Culture (routine x 2)     Status: Abnormal   Collection Time: 09/05/2020 10:45 PM   Specimen: BLOOD  Result Value Ref Range Status   Specimen Description   Final    BLOOD LEFT ANTECUBITAL Performed at [facility], [address] [place], [place] [address]    Special Requests   Final    BOTTLES DRAWN AEROBIC AND ANAEROBIC Blood Culture adequate volume [facility], [address] [place], [place] [address]    Culture  Setup Time   Final    GRAM POSITIVE COCCI IN BOTH AEROBIC AND ANAEROBIC BOTTLES CRITICAL RESULT CALLED TO, READ BACK BY AND VERIFIED WITH: PHARMD LYDIA CHEN AT 2147 ON 09/02/20 BY KJ Performed at [facility], [address] [place], [place] [address]    Culture STAPHYLOCOCCUS AUREUS (A)  Final   Report   Status 09/04/2020 FINAL  Final   Organism ID, Bacteria STAPHYLOCOCCUS AUREUS  Final      Susceptibility   Staphylococcus aureus - MIC*    CIPROFLOXACIN <=0.5 SENSITIVE Sensitive     ERYTHROMYCIN <=0.25 SENSITIVE Sensitive     GENTAMICIN <=0.5 SENSITIVE Sensitive     OXACILLIN <=0.25 SENSITIVE Sensitive     TETRACYCLINE <=1 SENSITIVE Sensitive      VANCOMYCIN <=0.5 SENSITIVE Sensitive     TRIMETH/SULFA <=10 SENSITIVE Sensitive     CLINDAMYCIN <=0.25 SENSITIVE Sensitive     RIFAMPIN <=0.5 SENSITIVE Sensitive     Inducible Clindamycin NEGATIVE Sensitive     * STAPHYLOCOCCUS AUREUS  Blood Culture (routine x 2)     Status: None (Preliminary result)   Collection Time: 09/08/2020 10:45 PM   Specimen: BLOOD  Result Value Ref Range Status   Specimen Description   Final    BLOOD BLOOD LEFT FOREARM Performed at [facility], [address] [place], [place] [address]    Special Requests   Final    BOTTLES DRAWN AEROBIC AND ANAEROBIC Blood Culture adequate volume Performed at [facility], [address] [place], [place] [address]    Culture   Final    NO GROWTH 4 DAYS Performed at [facility], [address] [place], [place] [address]    Report Status PENDING  Incomplete  Urine culture     Status: None   Collection Time: 08/26/2020 10:45 PM   Specimen: In/Out Cath Urine  Result Value Ref Range Status   Specimen Description   Final    IN/OUT CATH URINE Performed at [facility], [address] [place], [place] [address]    Special Requests   Final    NONE Performed at [facility], [address] [place], [place] [address]    Culture   Final    NO GROWTH Performed at [facility], [address] [place], [place] [address]    Report Status 09/03/2020 FINAL  Final  Blood Culture ID Panel (Reflexed)     Status: Abnormal   Collection Time: 08/29/2020 10:45 PM  Result Value Ref Range Status   Enterococcus faecalis NOT DETECTED NOT DETECTED Final   Enterococcus Faecium NOT DETECTED NOT DETECTED Final   Listeria monocytogenes NOT DETECTED NOT DETECTED Final   Staphylococcus species DETECTED (A) NOT DETECTED Final    Comment: CRITICAL RESULT CALLED TO, READ BACK BY AND VERIFIED WITH: PHARMD LYDIA CHEN AT 2147 ON 09/02/20 BY KJ       Staphylococcus aureus (BCID) DETECTED (A) NOT DETECTED Final    Comment: CRITICAL RESULT CALLED TO, READ BACK BY AND VERIFIED WITH: PHARMD LYDIA CHEN AT 2147 ON 09/02/20 BY KJ      Staphylococcus epidermidis NOT DETECTED NOT DETECTED Final   Staphylococcus lugdunensis NOT DETECTED NOT DETECTED Final   Streptococcus species NOT DETECTED NOT DETECTED Final   Streptococcus agalactiae NOT DETECTED NOT DETECTED Final   Streptococcus pneumoniae NOT DETECTED NOT DETECTED Final   Streptococcus pyogenes NOT DETECTED NOT DETECTED Final   A.calcoaceticus-baumannii NOT DETECTED NOT DETECTED Final   Bacteroides fragilis NOT DETECTED NOT DETECTED Final   Enterobacterales NOT DETECTED NOT DETECTED Final   Enterobacter cloacae complex NOT DETECTED NOT DETECTED Final   Escherichia coli NOT DETECTED NOT DETECTED Final   Klebsiella aerogenes NOT DETECTED NOT DETECTED Final   Klebsiella oxytoca NOT DETECTED NOT DETECTED Final   Klebsiella pneumoniae NOT DETECTED NOT DETECTED   Final   Proteus species NOT DETECTED NOT DETECTED Final   Salmonella species NOT DETECTED NOT DETECTED Final   Serratia marcescens NOT DETECTED NOT DETECTED Final   Haemophilus influenzae NOT DETECTED NOT DETECTED Final   Neisseria meningitidis NOT DETECTED NOT DETECTED Final   Pseudomonas aeruginosa NOT DETECTED NOT DETECTED Final   Stenotrophomonas maltophilia NOT DETECTED NOT DETECTED Final   Candida albicans NOT DETECTED NOT DETECTED Final   Candida auris NOT DETECTED NOT DETECTED Final   Candida glabrata NOT DETECTED NOT DETECTED Final   Candida krusei NOT DETECTED NOT DETECTED Final   Candida parapsilosis NOT DETECTED NOT DETECTED Final   Candida tropicalis NOT DETECTED NOT DETECTED Final   Cryptococcus neoformans/gattii NOT DETECTED NOT DETECTED Final   Meth resistant mecA/C and MREJ NOT DETECTED NOT DETECTED Final    Comment: Performed at [facility], [address] [place], [place] [address]  MRSA PCR Screening      Status: None   Collection Time: 09/02/20 11:16 AM  Result Value Ref Range Status   MRSA by PCR NEGATIVE NEGATIVE Final    Comment:        The GeneXpert MRSA Assay (FDA approved for NASAL specimens only), is one component of a comprehensive MRSA colonization surveillance program. It is not intended to diagnose MRSA infection nor to guide or monitor treatment for MRSA infections. Performed at [facility], [address] [place], [place] [address]   Culture, blood (routine x 2)     Status: None (Preliminary result)   Collection Time: 09/04/20  1:24 AM   Specimen: BLOOD  Result Value Ref Range Status   Specimen Description   Final    BLOOD SPECIMEN SOURCE NOT MARKED ON REQUISITION Performed at [facility], [address] [place], [place] [address]    Special Requests   Final    BOTTLES DRAWN AEROBIC ONLY Blood Culture results may not be optimal due to an inadequate volume of blood received in culture bottles Performed at [facility], [address] [place], [place] [address]    Culture   Final    NO GROWTH 2 DAYS Performed at [facility], [address] [place], [place] [address]    Report Status PENDING  Incomplete  Culture, blood (routine x 2)     Status: None (Preliminary result)   Collection Time: 09/04/20  1:24 AM   Specimen: BLOOD  Result Value Ref Range Status   Specimen Description   Final    BLOOD SPECIMEN SOURCE NOT MARKED ON REQUISITION Performed at [facility], [address] [place], [place] [address]    Special Requests   Final    BOTTLES DRAWN AEROBIC ONLY Blood Culture results may not be optimal due to an inadequate volume of blood received in culture bottles Performed at [facility], [address] [place], [place] [address]    Culture   Final    NO GROWTH 2 DAYS Performed at [facility], [address] , [place] [address]     Report Status PENDING  Incomplete     Radiology Studies: No results found. VAS US UPPER EXTREMITY VENOUS DUPLEX  Final Result    DG Chest Port 1 View  Final Result    DG CHEST PORT 1 VIEW    (Results Pending)    Scheduled Meds:  amiodarone  200 mg Oral Daily   ammonia         vitamin C  500 mg Oral Daily   atorvastatin  10 mg Oral Daily   chlorhexidine gluconate (MEDLINE KIT)  15 mL Mouth Rinse BID   [START ON 09/07/2020] Chlorhexidine Gluconate Cloth  6 each Topical Q0600   dexamethasone (DECADRON) injection  6 mg Intravenous Daily   enoxaparin (LOVENOX) injection  30 mg Subcutaneous Q24H   insulin aspart  0-15 Units Subcutaneous TID WC   insulin aspart  0-5 Units Subcutaneous QHS   linagliptin  5 mg Oral Daily   LORazepam       LORazepam       mouth rinse  15 mL Mouth Rinse 10 times per day   metoprolol succinate  100 mg Oral BID   zinc sulfate  220 mg Oral Daily   PRN Meds: acetaminophen, chlorpheniramine-HYDROcodone, guaiFENesin-dextromethorphan Continuous Infusions:   ceFAZolin (ANCEF) IV 2 g (08/25/2020 0813)   epinephrine     norepinephrine (LEVOPHED) Adult infusion       LOS: 4 days  Critical care time to evaluate and treat this patient was 60 minutes.  Independent of separate billable services  This patient is critically ill with the following life-threatening issues requiring my presence at the bedside: Hemodynamic instability requiring titration of medications Oxygenation/ventilation instability requiring frequent modifications of support Cardiac rhythm disturbances requiring evaluation and/or interventions Fluctuations in neurologic function requiring evaluation and/or interventions and/or fluid/volume titration    , MD [facility] 08/25/2020, 5:22 PM  

## 2020-09-13 NOTE — Procedures (Signed)
Central Venous Catheter Insertion Procedure Note  Whitney [name]  [id number]  Oct 11, 1927  Date:September 20, 2020  Time:5:29 PM   Provider Performing:Vimal Derego A [name]   Procedure: Insertion of Non-tunneled Central Venous Catheter(36556) with US guidance (09407)   Indication(s) Medication administration and Difficult access  Consent Unable to obtain consent due to emergent nature of procedure.  Anesthesia Topical only with 1% lidocaine   Timeout Verified patient identification, verified procedure, site/side was marked, verified correct patient position, special equipment/implants available, medications/allergies/relevant history reviewed, required imaging and test results available.  Sterile Technique Maximal sterile technique including full sterile barrier drape, hand hygiene, sterile gown, sterile gloves, mask, hair covering, sterile ultrasound probe cover (if used).  Procedure Description Area of catheter insertion was cleaned with chlorhexidine and draped in sterile fashion.  With real-time ultrasound guidance a central venous catheter was placed into the right internal jugular vein. Nonpulsatile blood flow and easy flushing noted in all ports.  The catheter was sutured in place and sterile dressing applied.  Complications/Tolerance None; patient tolerated the procedure well. Chest X-ray is ordered to verify placement for internal jugular or subclavian cannulation.   Chest x-ray is not ordered for femoral cannulation.  EBL Minimal  Specimen(s) None

## 2020-09-13 NOTE — Progress Notes (Signed)
Patient has not slept all night.  Patient has hollered and yelled, not able or willing to tell RN or NA what she wants when we enter the room, mumbling about lights, TV ect.  This morning she was insistent on getting out of bed to chair yelling she has had enough of the bed.  RN and NA attempted to get the patient up per her request but she was unable to get up out of the bed, legs too weak.

## 2020-09-13 NOTE — Progress Notes (Signed)
32ml of Morphine wasted and witnessed by Vista Mink RN in [place] in medication room.

## 2020-09-13 NOTE — Progress Notes (Signed)
Time of death 50. Patient's niece at bedside. MD made aware.

## 2020-09-13 NOTE — Progress Notes (Signed)
Provided grief support to family over a period of time as they made the difficult decision and went to say their good-byes.  [address] [place] Pager, [phone]

## 2020-09-13 NOTE — Discharge Summary (Signed)
Death Summary  Whitney [name] UXN:[id number] DOB: 1927/07/26 DOA: 2020/09/15  PCP: [name], MD  Admit date: 2020-09-15 Date of Death: 20-Sep-2020 Time of Death: 06-15-1856 Notification: Family notified of death and present   History of present illness:   Whitney [name] is a 87 year old female with history of paroxysmal A. fib, diabetes mellitus type 2, hypertension, hyperlipidemia, chronic diastolic CHF presented with fever and weakness.  On presentation, she was found to be febrile, tachycardic with lactic acid of 2.6, creatinine of 2.26.  Chest x-ray showed bibasilar opacities with small right pleural effusion.  She tested positive for COVID.  She was found to have right upper extremity cellulitis.  She was started on IV fluids, antibiotics, remdesivir and steroids.  She was subsequently found to have MSSA bacteremia.   As hospitalization progressed, she had initial improvement then started to decline.  Tentative plan was for a TEE for further evaluation regarding MSSA bacteremia.  On 20-Sep-2020 she continued to acutely decompensate into cardiac arrest, PEA.  CODE BLUE was initiated and prolonged resuscitation efforts were performed. Multiple discussions were held with family members including POA.  Palliative care and chaplain assisted with family discussions. After resuscitation, it was felt that patient had a significantly poor neurologic status with ongoing poor prognosis and very little chance of any meaningful recovery. After discussions with family and POA, son, Whitney [name]; decision was made to pursue comfort care measures and a compassionate extubation. All life-sustaining measures were discontinued after family visited with patient bedside in the ICU.  The patient was then compassionately extubated, started on a morphine drip, and comfort care was pursued. Patient passed naturally at [place] on 2020-09-20.  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    Significant Diagnostic Studies: DG Chest Port 1 View  Result Date: 2020/09/15 CLINICAL DATA:  New cough for 2 days, possible sepsis, initial encounter EXAM: PORTABLE CHEST 1 VIEW COMPARISON:  07/04/2020 FINDINGS: Cardiac shadow is enlarged but stable. Aortic calcifications are noted. Right-sided pleural effusion is again seen. Bibasilar airspace opacities are noted. No bony abnormality is noted. IMPRESSION: Bibasilar opacity with associated right-sided pleural effusion. The left basilar changes are new from the prior exam. Electronically Signed   By: Inez Catalina M.D.   On: 15-Sep-2020 22:57   ECHOCARDIOGRAM COMPLETE  Result Date: 09/03/2020    ECHOCARDIOGRAM REPORT   Patient Name:   Whitney [name] Date of Exam: 09/03/2020 Medical Rec #:  [id number]         Height:       66.0 in Accession #:    [id number]        Weight:       154.1 lb Date of Birth:  1928-03-16         BSA:          1.790 m Patient Age:    42 years          BP:           101/70 mmHg Patient Gender: F                 HR:           82 bpm. Exam Location:  Inpatient Procedure: 2D Echo, Color Doppler and Cardiac Doppler Indications:    Bacteremia  History:  Patient has prior history of Echocardiogram examinations, most                 recent 12/12/2013. Arrythmias:Atrial Fibrillation; Risk                 Factors:Former Smoker, Diabetes, Dyslipidemia and Hypertension.  Sonographer:    Bernadene Person RDCS Referring Phys: [id number] [place]  1. Left ventricular ejection fraction, by estimation, is 50 to 55%. The left ventricle has low normal function. The left ventricle has no regional wall motion abnormalities. Left ventricular diastolic function could not be evaluated.  2. Right ventricular systolic function  is normal. The right ventricular size is normal. There is mildly elevated pulmonary artery systolic pressure.  3. Left atrial size was severely dilated.  4. Right atrial size was mildly dilated.  5. Moderate pleural effusion in the left lateral region.  6. The mitral valve is normal in structure. Mild mitral valve regurgitation. No evidence of mitral stenosis.  7. Tricuspid valve regurgitation is moderate.  8. The aortic valve is tricuspid. Aortic valve regurgitation is mild. Mild aortic valve sclerosis is present, with no evidence of aortic valve stenosis.  9. The inferior vena cava is normal in size with greater than 50% respiratory variability, suggesting right atrial pressure of 3 mmHg. FINDINGS  Left Ventricle: Left ventricular ejection fraction, by estimation, is 50 to 55%. The left ventricle has low normal function. The left ventricle has no regional wall motion abnormalities. The left ventricular internal cavity size was normal in size. There is no left ventricular hypertrophy. Left ventricular diastolic function could not be evaluated due to atrial fibrillation. Left ventricular diastolic function could not be evaluated. Right Ventricle: The right ventricular size is normal. Right ventricular systolic function is normal. There is mildly elevated pulmonary artery systolic pressure. The tricuspid regurgitant velocity is 3.17 m/s, and with an assumed right atrial pressure of 3 mmHg, the estimated right ventricular systolic pressure is 76.7 mmHg. Left Atrium: Left atrial size was severely dilated. Right Atrium: Right atrial size was mildly dilated. Pericardium: Trivial pericardial effusion is present. Mitral Valve: The mitral valve is normal in structure. Mild mitral annular calcification. Mild mitral valve regurgitation. No evidence of mitral valve stenosis. Tricuspid Valve: The tricuspid valve is normal in structure. Tricuspid valve regurgitation is moderate . No evidence of tricuspid stenosis. Aortic Valve:  The aortic valve is tricuspid. Aortic valve regurgitation is mild. Aortic regurgitation PHT measures 699 msec. Mild aortic valve sclerosis is present, with no evidence of aortic valve stenosis. Pulmonic Valve: The pulmonic valve was normal in structure. Pulmonic valve regurgitation is mild. No evidence of pulmonic stenosis. Aorta: The aortic root is normal in size and structure. Venous: The inferior vena cava is normal in size with greater than 50% respiratory variability, suggesting right atrial pressure of 3 mmHg. IAS/Shunts: No atrial level shunt detected by color flow Doppler. Additional Comments: There is a moderate pleural effusion in the left lateral region.  LEFT VENTRICLE PLAX 2D LVIDd:         4.50 cm LVIDs:         2.70 cm LV PW:         1.00 cm LV IVS:        0.90 cm LVOT diam:     2.10 cm LV SV:         50 LV SV Index:   28 LVOT Area:     3.46 cm  LV Volumes (MOD) LV vol d, MOD A2C:  83.4 ml LV vol d, MOD A4C: 78.7 ml LV vol s, MOD A2C: 37.5 ml LV vol s, MOD A4C: 41.5 ml LV SV MOD A2C:     45.9 ml LV SV MOD A4C:     78.7 ml LV SV MOD BP:      44.9 ml RIGHT VENTRICLE TAPSE (M-mode): 1.3 cm LEFT ATRIUM             Index       RIGHT ATRIUM           Index LA diam:        4.50 cm 2.51 cm/m  RA Area:     22.50 cm LA Vol (A2C):   93.5 ml 52.23 ml/m RA Volume:   65.80 ml  36.76 ml/m LA Vol (A4C):   96.4 ml 53.85 ml/m LA Biplane Vol: 98.2 ml 54.86 ml/m  AORTIC VALVE LVOT Vmax:   69.40 cm/s LVOT Vmean:  46.800 cm/s LVOT VTI:    0.144 m AI PHT:      699 msec  AORTA Ao Root diam: 3.30 cm Ao Asc diam:  3.70 cm TRICUSPID VALVE TR Peak grad:   40.2 mmHg TR Vmax:        317.00 cm/s  SHUNTS Systemic VTI:  0.14 m Systemic Diam: 2.10 cm Kirk Ruths MD Electronically signed by Kirk Ruths MD Signature Date/Time: 09/03/2020/3:41:53 PM    Final    VAS [place] UPPER EXTREMITY VENOUS DUPLEX  Result Date: 09/05/2020 UPPER VENOUS STUDY  Patient Name:  TEREZA GILHAM  Date of Exam:   09/04/2020 Medical Rec #:  [id number]          Accession #:    [id number] Date of Birth: [birth year]          Patient Gender: F Patient Age:   89Y Exam Location:  [facility] Procedure:      VAS [place] UPPER EXTREMITY VENOUS DUPLEX Referring Phys: [place] --------------------------------------------------------------------------------  Indications: Swelling Risk Factors: COVID 19 positive. Comparison Study: No prior studies. Performing Technologist: Oliver Hum RVT  Examination Guidelines: A complete evaluation includes B-mode imaging, spectral Doppler, color Doppler, and power Doppler as needed of all accessible portions of each vessel. Bilateral testing is considered an integral part of a complete examination. Limited examinations for reoccurring indications may be performed as noted.  Right Findings: +----------+------------+---------+-----------+----------+-------+ RIGHT     CompressiblePhasicitySpontaneousPropertiesSummary +----------+------------+---------+-----------+----------+-------+ IJV           Full       Yes       Yes                      +----------+------------+---------+-----------+----------+-------+ Subclavian    Full       Yes       Yes                      +----------+------------+---------+-----------+----------+-------+ Axillary      Full       Yes       Yes                      +----------+------------+---------+-----------+----------+-------+ Brachial      Full       Yes       Yes                      +----------+------------+---------+-----------+----------+-------+ Radial        Full                                          +----------+------------+---------+-----------+----------+-------+  Ulnar         Full                                          +----------+------------+---------+-----------+----------+-------+ Cephalic      Full                                          +----------+------------+---------+-----------+----------+-------+  Basilic       Full                                          +----------+------------+---------+-----------+----------+-------+  Left Findings: +----------+------------+---------+-----------+----------+-------+ LEFT      CompressiblePhasicitySpontaneousPropertiesSummary +----------+------------+---------+-----------+----------+-------+ Subclavian    Full       Yes       Yes                      +----------+------------+---------+-----------+----------+-------+  Summary:  Right: No evidence of deep vein thrombosis in the upper extremity. No evidence of superficial vein thrombosis in the upper extremity.  Left: No evidence of thrombosis in the subclavian.  *See table(s) above for measurements and observations.  Diagnosing physician: Jamelle Haring Electronically signed by Jamelle Haring on 09/05/2020 at 12:26:54 PM.    Final     Microbiology: Recent Results (from the past 240 hour(s))  Resp Panel by RT-PCR (Flu A&B, Covid) Nasopharyngeal Swab     Status: Abnormal   Collection Time: 08/26/2020 10:45 PM   Specimen: Nasopharyngeal Swab; Nasopharyngeal(NP) swabs in vial transport medium  Result Value Ref Range Status   SARS Coronavirus 2 by RT PCR POSITIVE (A) NEGATIVE Final    Comment: RESULT CALLED TO, READ BACK BY AND VERIFIED WITH: HALEY SHEPHARD AT 0016 ON 09/02/20 BY MAJ (NOTE) SARS-CoV-2 target nucleic acids are DETECTED.  The SARS-CoV-2 RNA is generally detectable in upper respiratory specimens during the acute phase of infection. Positive results are indicative of the presence of the identified virus, but do not rule out bacterial infection or co-infection with other pathogens not detected by the test. Clinical correlation with patient history and other diagnostic information is necessary to determine patient infection status. The expected result is Negative.  Fact Sheet for Patients: EntrepreneurPulse.com.au  Fact Sheet for Healthcare  Providers: IncredibleEmployment.be  This test is not yet approved or cleared by the [place] FDA and  has been authorized for detection and/or diagnosis of SARS-CoV-2 by FDA under an Emergency Use Authorization (EUA).  This EUA will remain in effect (meaning this tes t can be used) for the duration of  the COVID-19 declaration under Section 564(b)(1) of the Act, 21 U.S.C. section 360bbb-3(b)(1), unless the authorization is terminated or revoked sooner.     Influenza A by PCR NEGATIVE NEGATIVE Final   Influenza B by PCR NEGATIVE NEGATIVE Final    Comment: (NOTE) The Xpert Xpress SARS-CoV-2/FLU/RSV plus assay is intended as an aid in the diagnosis of influenza from Nasopharyngeal swab specimens and should not be used as a sole basis for treatment. Nasal washings and aspirates are unacceptable for Xpert Xpress SARS-CoV-2/FLU/RSV testing.  Fact Sheet for Patients: EntrepreneurPulse.com.au  Fact Sheet for Healthcare Providers: IncredibleEmployment.be  This test is not yet approved or cleared by  the [employer] and has been authorized for detection and/or diagnosis of SARS-CoV-2 by FDA under an Emergency Use Authorization (EUA). This EUA will remain in effect (meaning this test can be used) for the duration of the COVID-19 declaration under Section 564(b)(1) of the Act, 21 U.S.C. section 360bbb-3(b)(1), unless the authorization is terminated or revoked.  Performed at [facility], [place] [address] [place], [place] [address]   Blood Culture (routine x 2)     Status: Abnormal   Collection Time: 09/02/2020 10:45 PM   Specimen: BLOOD  Result Value Ref Range Status   Specimen Description   Final    BLOOD LEFT ANTECUBITAL Performed at [facility] [address], [place], [place] [address]    Special Requests   Final    BOTTLES DRAWN AEROBIC AND ANAEROBIC Blood Culture adequate  volume Performed at [facility] [address], [place], [place] [address]    Culture  Setup Time   Final    GRAM POSITIVE COCCI IN BOTH AEROBIC AND ANAEROBIC BOTTLES CRITICAL RESULT CALLED TO, READ BACK BY AND VERIFIED WITH: PHARMD LYDIA CHEN AT 2147 ON 09/02/20 BY KJ Performed at [facility], [place] [address], [place], [place] [address]    Culture STAPHYLOCOCCUS AUREUS (A)  Final   Report Status 09/04/2020 FINAL  Final   Organism ID, Bacteria STAPHYLOCOCCUS AUREUS  Final      Susceptibility   Staphylococcus aureus - MIC*    CIPROFLOXACIN <=0.5 SENSITIVE Sensitive     ERYTHROMYCIN <=0.25 SENSITIVE Sensitive     GENTAMICIN <=0.5 SENSITIVE Sensitive     OXACILLIN <=0.25 SENSITIVE Sensitive     TETRACYCLINE <=1 SENSITIVE Sensitive     VANCOMYCIN <=0.5 SENSITIVE Sensitive     TRIMETH/SULFA <=10 SENSITIVE Sensitive     CLINDAMYCIN <=0.25 SENSITIVE Sensitive     RIFAMPIN <=0.5 SENSITIVE Sensitive     Inducible Clindamycin NEGATIVE Sensitive     * STAPHYLOCOCCUS AUREUS  Blood Culture (routine x 2)     Status: None   Collection Time: 09/10/2020 10:45 PM   Specimen: BLOOD  Result Value Ref Range Status   Specimen Description   Final    BLOOD BLOOD LEFT FOREARM Performed at [facility] [address], [place], [place] [address]    Special Requests   Final    BOTTLES DRAWN AEROBIC AND ANAEROBIC Blood Culture adequate volume Performed at [place] [address], [place], [place] [address]    Culture   Final    NO GROWTH 5 DAYS Performed at [facility], [place] [address], [place], [place] [address]    Report Status 09/07/2020 FINAL  Final  Urine culture     Status: None   Collection Time: 08/31/2020 10:45 PM   Specimen: In/Out Cath Urine  Result Value Ref Range Status   Specimen Description   Final    IN/OUT CATH URINE Performed at [facility] [address] [place], [place] [address]     Special Requests   Final    NONE Performed at [facility], [place] [address], [place], [place] [address]    Culture   Final    NO GROWTH Performed at [facility], [place] [address] [place], [place] [address]    Report Status 09/03/2020 FINAL  Final  Blood Culture ID Panel (Reflexed)     Status: Abnormal   Collection Time: 08/31/2020 10:45 PM  Result  Value Ref Range Status   Enterococcus faecalis NOT DETECTED NOT DETECTED Final   Enterococcus Faecium NOT DETECTED NOT DETECTED Final   Listeria monocytogenes NOT DETECTED NOT DETECTED Final   Staphylococcus species DETECTED (A) NOT DETECTED Final    Comment: CRITICAL RESULT CALLED TO, READ BACK BY AND VERIFIED WITH: PHARMD LYDIA CHEN AT 2147 ON 09/02/20 BY KJ      Staphylococcus aureus (BCID) DETECTED (A) NOT DETECTED Final    Comment: CRITICAL RESULT CALLED TO, READ BACK BY AND VERIFIED WITH: PHARMD LYDIA CHEN AT 2147 ON 09/02/20 BY KJ      Staphylococcus epidermidis NOT DETECTED NOT DETECTED Final   Staphylococcus lugdunensis NOT DETECTED NOT DETECTED Final   Streptococcus species NOT DETECTED NOT DETECTED Final   Streptococcus agalactiae NOT DETECTED NOT DETECTED Final   Streptococcus pneumoniae NOT DETECTED NOT DETECTED Final   Streptococcus pyogenes NOT DETECTED NOT DETECTED Final   A.calcoaceticus-baumannii NOT DETECTED NOT DETECTED Final   Bacteroides fragilis NOT DETECTED NOT DETECTED Final   Enterobacterales NOT DETECTED NOT DETECTED Final   Enterobacter cloacae complex NOT DETECTED NOT DETECTED Final   Escherichia coli NOT DETECTED NOT DETECTED Final   Klebsiella aerogenes NOT DETECTED NOT DETECTED Final   Klebsiella oxytoca NOT DETECTED NOT DETECTED Final   Klebsiella pneumoniae NOT DETECTED NOT DETECTED Final   Proteus species NOT DETECTED NOT DETECTED Final   Salmonella species NOT DETECTED NOT DETECTED Final   Serratia marcescens NOT DETECTED NOT DETECTED Final   Haemophilus influenzae NOT DETECTED  NOT DETECTED Final   Neisseria meningitidis NOT DETECTED NOT DETECTED Final   Pseudomonas aeruginosa NOT DETECTED NOT DETECTED Final   Stenotrophomonas maltophilia NOT DETECTED NOT DETECTED Final   Candida albicans NOT DETECTED NOT DETECTED Final   Candida auris NOT DETECTED NOT DETECTED Final   Candida glabrata NOT DETECTED NOT DETECTED Final   Candida krusei NOT DETECTED NOT DETECTED Final   Candida parapsilosis NOT DETECTED NOT DETECTED Final   Candida tropicalis NOT DETECTED NOT DETECTED Final   Cryptococcus neoformans/gattii NOT DETECTED NOT DETECTED Final   Meth resistant mecA/C and MREJ NOT DETECTED NOT DETECTED Final    Comment: Performed at [facility], [address], [place], [place] [address]  MRSA PCR Screening     Status: None   Collection Time: 09/02/20 11:16 AM  Result Value Ref Range Status   MRSA by PCR NEGATIVE NEGATIVE Final    Comment:        The GeneXpert MRSA Assay (FDA approved for NASAL specimens only), is one component of a comprehensive MRSA colonization surveillance program. It is not intended to diagnose MRSA infection nor to guide or monitor treatment for MRSA infections. Performed at [facility], [place] [address], [place], [place] [address]   Culture, blood (routine x 2)     Status: None (Preliminary result)   Collection Time: 09/04/20  1:24 AM   Specimen: BLOOD  Result Value Ref Range Status   Specimen Description   Final    BLOOD SPECIMEN SOURCE NOT MARKED ON REQUISITION Performed at [facility] [address] [place], [place] [address]    Special Requests   Final    BOTTLES DRAWN AEROBIC ONLY Blood Culture results may not be optimal due to an inadequate volume of blood received in culture bottles Performed at [place] [facility] [address] [place], [place] [address]    Culture   Final    NO GROWTH 3 DAYS Performed at  [facility], [place] [address], [place], [place]  [address]    Report Status PENDING  Incomplete  Culture, blood (routine x 2)     Status: None (Preliminary result)   Collection Time: 09/04/20  1:24 AM   Specimen: BLOOD  Result Value Ref Range Status   Specimen Description   Final    BLOOD SPECIMEN SOURCE NOT MARKED ON REQUISITION Performed at [facility] [address], [place], [place] [address]    Special Requests   Final    BOTTLES DRAWN AEROBIC ONLY Blood Culture results may not be optimal due to an inadequate volume of blood received in culture bottles Performed at [facility] [address], [place], [place] [address]    Culture   Final    NO GROWTH 3 DAYS Performed at [facility], [place] [address] [place], [place] [address]    Report Status PENDING  Incomplete     Labs: Basic Metabolic Panel: Recent Labs  Lab 09/02/20 1141 09/03/20 0443 09/04/20 0057 09/05/20 0847 10/05/2020 0351  NA 139 138 136 138 139  K 4.2 5.1 5.1 5.3* 4.6  CL 109 111 107 109 110  CO2 17* 20* 18* 16* 14*  GLUCOSE 162* 173* 181* 161* 132*  BUN 40* 43* 49* 54* 59*  CREATININE 2.01* 2.29* 2.44* 2.52* 2.80*  CALCIUM 8.0* 8.0* 7.7* 7.6* 7.6*  MG  --   --   --  2.2 2.0   Liver Function Tests: Recent Labs  Lab 09/02/20 1141 09/03/20 0443 09/04/20 0057 09/05/20 0847 October 05, 2020 0351  AST 83* 117* 84* 40 37  ALT 72* 86* 78* 29 11  ALKPHOS 36* 39 40 34* 38  BILITOT 0.8 1.2 0.9 1.2 1.0  PROT 5.0* 5.1* 5.0* 5.5* 5.6*  ALBUMIN 2.4* 2.6* 2.4* 3.5 3.6   No results for input(s): LIPASE, AMYLASE in the last 168 hours. No results for input(s): AMMONIA in the last 168 hours. CBC: Recent Labs  Lab 09/08/2020 2314 09/02/20 0849 09/03/20 1242 09/04/20 0057 2020/10/05 0351  WBC 6.0 6.8 7.7 7.7 7.2  NEUTROABS  --  6.0 7.2 7.1 6.6  HGB 10.0* 10.0* 10.7* 10.8* 9.1*  HCT 30.7* 29.6* 33.5* 32.0* 27.8*  MCV 107.3* 105.3* 108.8* 103.9* 106.1*  PLT 144* 118* 166 128* 173   Cardiac Enzymes: No results for  input(s): CKTOTAL, CKMB, CKMBINDEX, TROPONINI in the last 168 hours. D-Dimer Recent Labs    09/05/20 0847 10/05/2020 0351  DDIMER 3.43* 3.02*   BNP: Invalid input(s): POCBNP CBG: Recent Labs  Lab 09/05/20 1112 09/05/20 1635 09/05/20 2041 2020-10-05 0813 10-05-2020 1158  GLUCAP 244* 196* 189* 158* 162*   Anemia work up Recent Labs    09/05/20 0847 10/05/2020 0351  FERRITIN 455* 267   Urinalysis    Component Value Date/Time   COLORURINE YELLOW 09/04/2020 2245   APPEARANCEUR CLEAR 08/28/2020 2245   LABSPEC 1.011 09/08/2020 2245   PHURINE 5.0 08/28/2020 2245   GLUCOSEU NEGATIVE 08/27/2020 2245   GLUCOSEU NEGATIVE 11/24/2012 0831   HGBUR SMALL (A) 09/08/2020 2245   BILIRUBINUR NEGATIVE 09/02/2020 2245   BILIRUBINUR neg 09/19/2014 [place] 09/07/2020 2245   PROTEINUR NEGATIVE 08/27/2020 2245   UROBILINOGEN 0.2 09/19/2014 1605   UROBILINOGEN 0.2 02/24/2014 0024   NITRITE NEGATIVE 08/23/2020 2245   LEUKOCYTESUR NEGATIVE 08/21/2020 2245   Sepsis Labs Invalid input(s): PROCALCITONIN,  WBC,  LACTICIDVEN     SIGNED:  Dwyane Dee, MD  [facility] 09/07/2020, 3:26  PM

## 2020-09-13 NOTE — Progress Notes (Signed)
Received patient in unit approximately 1708h. Report received from previous RN, Verdis Frederickson. Patient on levophed at time of arrival to unit, titrated based on verbal orders from MD (at bedside). Unable to perform head to toe assessment at this time will attempt as able as patient becomes more stable. Attending MD in communication with family.

## 2020-09-13 NOTE — Progress Notes (Signed)
Per Critical Care MD administered 1 mg push of Epinephrine.

## 2020-09-13 DEATH — deceased

## 2020-10-25 ENCOUNTER — Telehealth: Payer: Self-pay

## 2021-01-24 ENCOUNTER — Ambulatory Visit: Payer: Medicare Other | Admitting: Endocrinology

## 2021-12-23 IMAGING — DX DG CHEST 1V PORT
1 series · 1 of 1 positions shown · non-contrast
Comparison: 07/04/2020

CLINICAL DATA: New cough for 2 days, possible sepsis, initial
encounter

EXAM:
PORTABLE CHEST 1 VIEW

[chest ap]
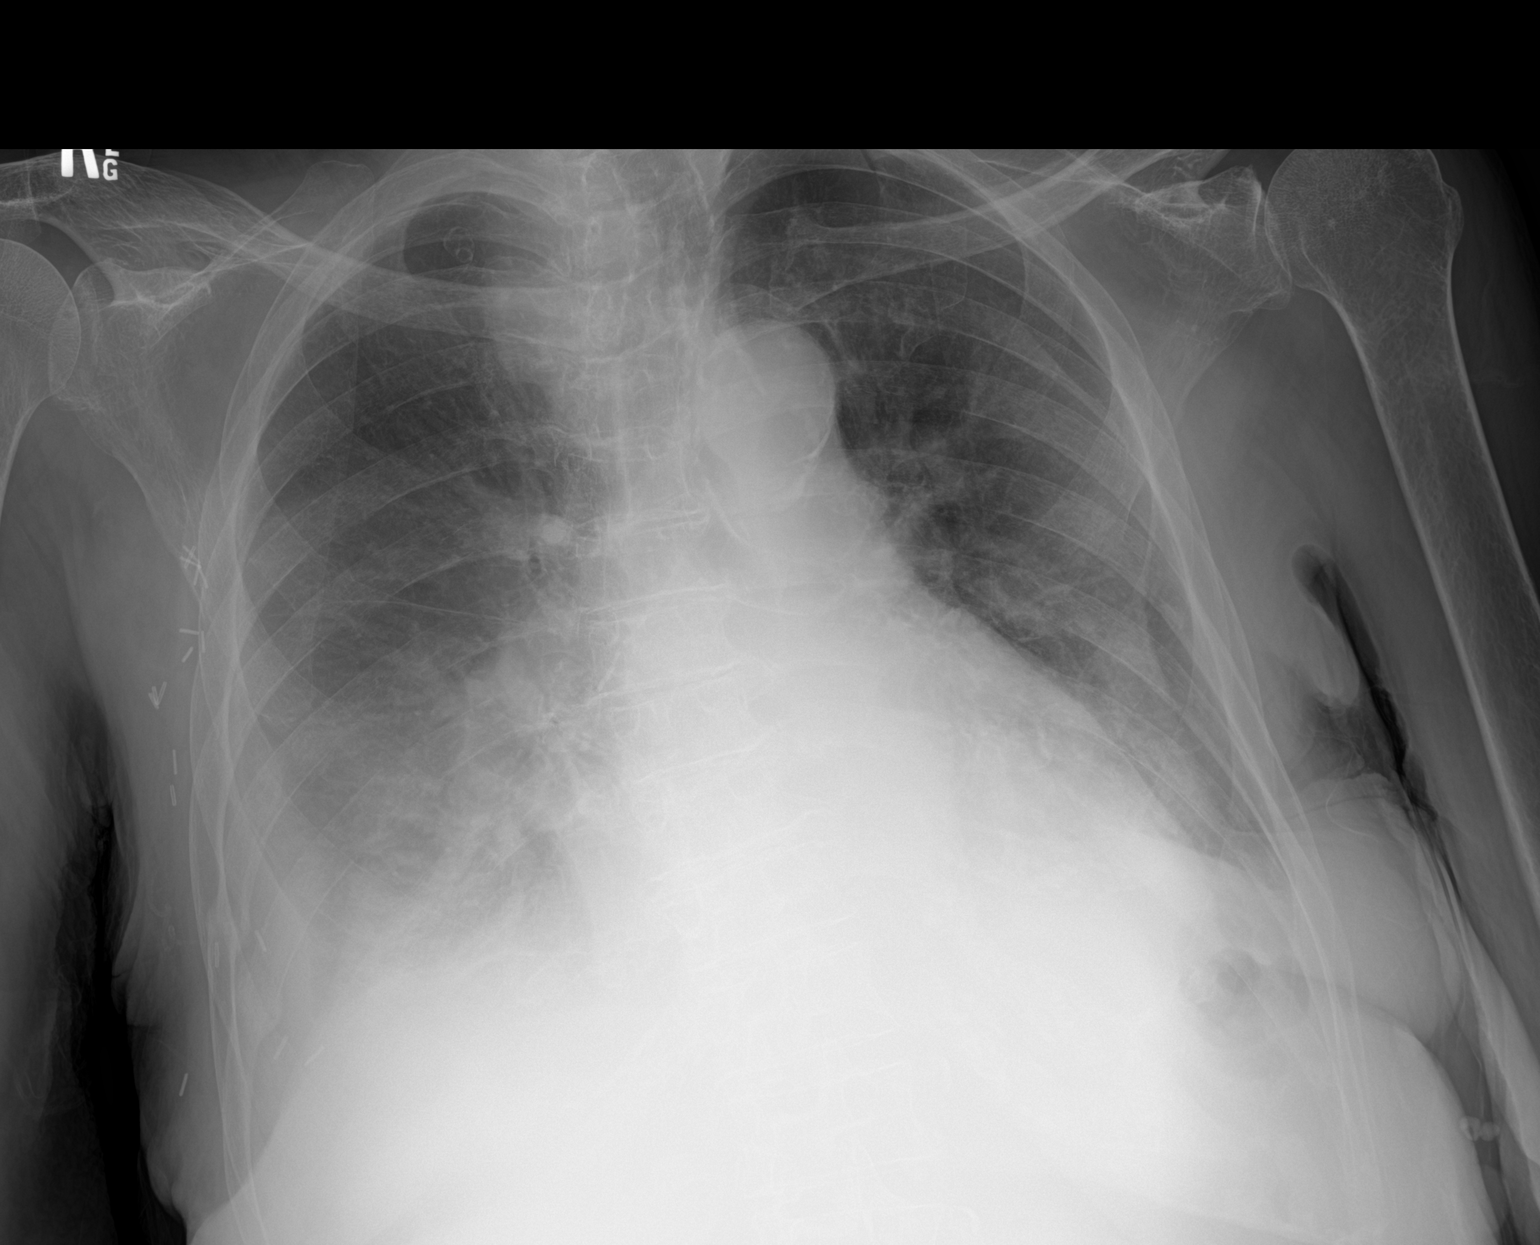

[1 of 1 positions shown; findings below may reference images not displayed]

FINDINGS: Cardiac shadow is enlarged but stable. Aortic calcifications are
noted. Right-sided pleural effusion is again seen. Bibasilar
airspace opacities are noted. No bony abnormality is noted.
IMPRESSION: Bibasilar opacity with associated right-sided pleural effusion. The
left basilar changes are new from the prior exam.
